# Patient Record
Sex: Female | Born: 1940
Health system: Southern US, Community
[De-identification: ages and names within clinical notes are randomized; demographics above are authoritative.]

## PROBLEM LIST (undated history)

## (undated) DIAGNOSIS — S638X2A Sprain of other part of left wrist and hand, initial encounter: Secondary | ICD-10-CM

## (undated) DIAGNOSIS — M1990 Unspecified osteoarthritis, unspecified site: Secondary | ICD-10-CM

## (undated) DIAGNOSIS — F418 Other specified anxiety disorders: Secondary | ICD-10-CM

## (undated) DIAGNOSIS — E785 Hyperlipidemia, unspecified: Secondary | ICD-10-CM

## (undated) DIAGNOSIS — C3490 Malignant neoplasm of unspecified part of unspecified bronchus or lung: Secondary | ICD-10-CM

## (undated) DIAGNOSIS — M8580 Other specified disorders of bone density and structure, unspecified site: Secondary | ICD-10-CM

## (undated) DIAGNOSIS — K635 Polyp of colon: Secondary | ICD-10-CM

## (undated) DIAGNOSIS — N3020 Other chronic cystitis without hematuria: Secondary | ICD-10-CM

## (undated) DIAGNOSIS — K449 Diaphragmatic hernia without obstruction or gangrene: Secondary | ICD-10-CM

## (undated) DIAGNOSIS — I10 Essential (primary) hypertension: Secondary | ICD-10-CM

## (undated) DIAGNOSIS — K219 Gastro-esophageal reflux disease without esophagitis: Secondary | ICD-10-CM

## (undated) DIAGNOSIS — M4620 Osteomyelitis of vertebra, site unspecified: Secondary | ICD-10-CM

## (undated) DIAGNOSIS — K5790 Diverticulosis of intestine, part unspecified, without perforation or abscess without bleeding: Secondary | ICD-10-CM

## (undated) DIAGNOSIS — K279 Peptic ulcer, site unspecified, unspecified as acute or chronic, without hemorrhage or perforation: Secondary | ICD-10-CM

## (undated) HISTORY — PX: OOPHORECTOMY: SHX86

## (undated) HISTORY — DX: Other specified anxiety disorders: F41.8

## (undated) HISTORY — PX: BLADDER SURGERY: SHX569

## (undated) HISTORY — DX: Diaphragmatic hernia without obstruction or gangrene: K44.9

## (undated) HISTORY — DX: Other specified disorders of bone density and structure, unspecified site: M85.80

## (undated) HISTORY — DX: Diverticulosis of intestine, part unspecified, without perforation or abscess without bleeding: K57.90

## (undated) HISTORY — DX: Sprain of other part of left wrist and hand, initial encounter: S63.8X2A

## (undated) HISTORY — DX: Polyp of colon: K63.5

## (undated) HISTORY — PX: TUBAL LIGATION: SHX77

## (undated) HISTORY — DX: Essential (primary) hypertension: I10

## (undated) HISTORY — DX: Other chronic cystitis without hematuria: N30.20

## (undated) HISTORY — DX: Peptic ulcer, site unspecified, unspecified as acute or chronic, without hemorrhage or perforation: K27.9

## (undated) HISTORY — DX: Malignant neoplasm of unspecified part of unspecified bronchus or lung: C34.90

## (undated) HISTORY — DX: Osteomyelitis of vertebra, site unspecified: M46.20

## (undated) HISTORY — DX: Hyperlipidemia, unspecified: E78.5

## (undated) HISTORY — DX: Gastro-esophageal reflux disease without esophagitis: K21.9

## (undated) HISTORY — DX: Unspecified osteoarthritis, unspecified site: M19.90

---

## 1985-05-24 DIAGNOSIS — C3490 Malignant neoplasm of unspecified part of unspecified bronchus or lung: Secondary | ICD-10-CM

## 1985-05-24 HISTORY — DX: Malignant neoplasm of unspecified part of unspecified bronchus or lung: C34.90

## 1997-07-22 HISTORY — PX: BREAST BIOPSY: SHX20

## 1998-05-24 DIAGNOSIS — M4620 Osteomyelitis of vertebra, site unspecified: Secondary | ICD-10-CM

## 1998-05-24 HISTORY — PX: MOUTH SURGERY: SHX715

## 1998-05-24 HISTORY — DX: Osteomyelitis of vertebra, site unspecified: M46.20

## 2001-07-18 HISTORY — PX: TOTAL ABDOMINAL HYSTERECTOMY: SHX209

## 2002-07-12 ENCOUNTER — Other Ambulatory Visit: Admission: RE | Admit: 2002-07-12 | Discharge: 2002-07-12 | Payer: Self-pay | Admitting: Family Medicine

## 2002-07-12 ENCOUNTER — Encounter: Payer: Self-pay | Admitting: Family Medicine

## 2003-01-17 LAB — HM DEXA SCAN

## 2004-03-02 ENCOUNTER — Ambulatory Visit: Payer: Self-pay | Admitting: Family Medicine

## 2004-04-20 ENCOUNTER — Ambulatory Visit: Payer: Self-pay | Admitting: Family Medicine

## 2004-04-22 ENCOUNTER — Ambulatory Visit: Payer: Self-pay | Admitting: Family Medicine

## 2004-09-11 ENCOUNTER — Ambulatory Visit: Payer: Self-pay | Admitting: Family Medicine

## 2004-11-27 ENCOUNTER — Ambulatory Visit: Payer: Self-pay | Admitting: Family Medicine

## 2005-02-16 ENCOUNTER — Ambulatory Visit: Payer: Self-pay | Admitting: Family Medicine

## 2005-03-22 ENCOUNTER — Ambulatory Visit: Payer: Self-pay | Admitting: Family Medicine

## 2005-03-25 ENCOUNTER — Ambulatory Visit: Payer: Self-pay | Admitting: Family Medicine

## 2005-04-14 ENCOUNTER — Ambulatory Visit: Payer: Self-pay | Admitting: Family Medicine

## 2005-04-23 ENCOUNTER — Ambulatory Visit: Payer: Self-pay | Admitting: Family Medicine

## 2005-05-07 ENCOUNTER — Ambulatory Visit: Payer: Self-pay | Admitting: Family Medicine

## 2005-09-22 ENCOUNTER — Ambulatory Visit: Payer: Self-pay | Admitting: Family Medicine

## 2005-11-08 ENCOUNTER — Ambulatory Visit: Payer: Self-pay | Admitting: Family Medicine

## 2006-02-21 ENCOUNTER — Encounter: Payer: Self-pay | Admitting: Family Medicine

## 2006-03-25 ENCOUNTER — Ambulatory Visit: Payer: Self-pay | Admitting: Family Medicine

## 2006-03-25 LAB — CONVERTED CEMR LAB: Hgb A1c MFr Bld: 5.7 %

## 2006-03-29 ENCOUNTER — Ambulatory Visit: Payer: Self-pay | Admitting: Family Medicine

## 2006-04-19 ENCOUNTER — Ambulatory Visit: Payer: Self-pay | Admitting: Family Medicine

## 2006-04-28 ENCOUNTER — Ambulatory Visit: Payer: Self-pay | Admitting: Family Medicine

## 2006-06-16 ENCOUNTER — Ambulatory Visit: Payer: Self-pay | Admitting: Family Medicine

## 2006-08-01 ENCOUNTER — Ambulatory Visit: Payer: Self-pay | Admitting: Internal Medicine

## 2006-08-22 ENCOUNTER — Ambulatory Visit: Payer: Self-pay | Admitting: Family Medicine

## 2006-10-26 ENCOUNTER — Ambulatory Visit: Payer: Self-pay | Admitting: Family Medicine

## 2006-10-26 LAB — CONVERTED CEMR LAB
Glucose, Urine, Semiquant: NEGATIVE
Ketones, urine, test strip: NEGATIVE
Urobilinogen, UA: 0.2
WBC Urine, dipstick: NEGATIVE
pH: 7

## 2006-11-09 ENCOUNTER — Ambulatory Visit: Payer: Self-pay | Admitting: Family Medicine

## 2006-11-21 ENCOUNTER — Encounter: Payer: Self-pay | Admitting: Family Medicine

## 2006-11-21 DIAGNOSIS — F329 Major depressive disorder, single episode, unspecified: Secondary | ICD-10-CM | POA: Insufficient documentation

## 2006-11-21 DIAGNOSIS — I10 Essential (primary) hypertension: Secondary | ICD-10-CM

## 2006-11-21 DIAGNOSIS — R739 Hyperglycemia, unspecified: Secondary | ICD-10-CM

## 2006-11-21 DIAGNOSIS — F411 Generalized anxiety disorder: Secondary | ICD-10-CM | POA: Insufficient documentation

## 2006-11-21 DIAGNOSIS — R799 Abnormal finding of blood chemistry, unspecified: Secondary | ICD-10-CM | POA: Insufficient documentation

## 2006-11-21 DIAGNOSIS — F331 Major depressive disorder, recurrent, moderate: Secondary | ICD-10-CM

## 2006-11-21 DIAGNOSIS — N39498 Other specified urinary incontinence: Secondary | ICD-10-CM | POA: Insufficient documentation

## 2006-11-21 DIAGNOSIS — F32A Depression, unspecified: Secondary | ICD-10-CM | POA: Insufficient documentation

## 2006-11-24 ENCOUNTER — Ambulatory Visit: Payer: Self-pay | Admitting: Family Medicine

## 2006-12-09 ENCOUNTER — Ambulatory Visit: Payer: Self-pay | Admitting: Pulmonary Disease

## 2007-01-04 ENCOUNTER — Encounter: Payer: Self-pay | Admitting: Family Medicine

## 2007-01-27 ENCOUNTER — Ambulatory Visit: Payer: Self-pay | Admitting: Pulmonary Disease

## 2007-02-01 ENCOUNTER — Ambulatory Visit: Payer: Self-pay | Admitting: Pulmonary Disease

## 2007-02-01 LAB — CONVERTED CEMR LAB
CO2: 31 meq/L (ref 19–32)
Chloride: 102 meq/L (ref 96–112)
Creatinine, Ser: 0.8 mg/dL (ref 0.4–1.2)
Glucose, Bld: 114 mg/dL — ABNORMAL HIGH (ref 70–99)
Sodium: 140 meq/L (ref 135–145)

## 2007-02-06 ENCOUNTER — Ambulatory Visit: Payer: Self-pay | Admitting: Urology

## 2007-02-11 ENCOUNTER — Encounter: Payer: Self-pay | Admitting: Family Medicine

## 2007-02-23 ENCOUNTER — Ambulatory Visit: Payer: Self-pay | Admitting: Pulmonary Disease

## 2007-04-03 ENCOUNTER — Ambulatory Visit: Payer: Self-pay | Admitting: Family Medicine

## 2007-04-03 LAB — CONVERTED CEMR LAB
ALT: 29 units/L (ref 0–35)
Calcium: 9.8 mg/dL (ref 8.4–10.5)
Chloride: 101 meq/L (ref 96–112)
Direct LDL: 139.7 mg/dL
GFR calc non Af Amer: 67 mL/min
Sodium: 141 meq/L (ref 135–145)
TSH: 1.74 microintl units/mL (ref 0.35–5.50)
Total CHOL/HDL Ratio: 3.2
VLDL: 33 mg/dL (ref 0–40)

## 2007-04-04 ENCOUNTER — Ambulatory Visit: Payer: Self-pay | Admitting: Family Medicine

## 2007-04-21 ENCOUNTER — Encounter: Payer: Self-pay | Admitting: Family Medicine

## 2007-04-21 ENCOUNTER — Ambulatory Visit: Payer: Self-pay | Admitting: Family Medicine

## 2007-04-21 ENCOUNTER — Telehealth (INDEPENDENT_AMBULATORY_CARE_PROVIDER_SITE_OTHER): Payer: Self-pay | Admitting: *Deleted

## 2007-04-21 LAB — CONVERTED CEMR LAB
Bilirubin Urine: NEGATIVE
Urobilinogen, UA: 0.2

## 2007-04-28 ENCOUNTER — Encounter: Payer: Self-pay | Admitting: Pulmonary Disease

## 2007-05-01 ENCOUNTER — Ambulatory Visit: Payer: Self-pay | Admitting: Internal Medicine

## 2007-05-05 ENCOUNTER — Telehealth: Payer: Self-pay | Admitting: Pulmonary Disease

## 2007-05-08 ENCOUNTER — Encounter: Payer: Self-pay | Admitting: Family Medicine

## 2007-05-30 ENCOUNTER — Ambulatory Visit: Payer: Self-pay | Admitting: Family Medicine

## 2007-05-30 ENCOUNTER — Encounter: Payer: Self-pay | Admitting: Family Medicine

## 2007-05-31 ENCOUNTER — Encounter (INDEPENDENT_AMBULATORY_CARE_PROVIDER_SITE_OTHER): Payer: Self-pay | Admitting: *Deleted

## 2007-06-01 ENCOUNTER — Ambulatory Visit: Payer: Self-pay | Admitting: Family Medicine

## 2007-09-11 ENCOUNTER — Telehealth: Payer: Self-pay | Admitting: Family Medicine

## 2007-09-14 ENCOUNTER — Encounter: Admission: RE | Admit: 2007-09-14 | Discharge: 2007-09-14 | Payer: Self-pay | Admitting: Family Medicine

## 2007-09-14 ENCOUNTER — Encounter (INDEPENDENT_AMBULATORY_CARE_PROVIDER_SITE_OTHER): Payer: Self-pay | Admitting: *Deleted

## 2007-10-20 ENCOUNTER — Ambulatory Visit: Payer: Self-pay | Admitting: Family Medicine

## 2007-12-25 ENCOUNTER — Encounter: Payer: Self-pay | Admitting: Family Medicine

## 2007-12-26 ENCOUNTER — Telehealth: Payer: Self-pay | Admitting: Family Medicine

## 2008-01-26 ENCOUNTER — Encounter: Payer: Self-pay | Admitting: Family Medicine

## 2008-02-01 ENCOUNTER — Ambulatory Visit: Payer: Self-pay | Admitting: Pulmonary Disease

## 2008-02-01 ENCOUNTER — Ambulatory Visit: Payer: Self-pay | Admitting: Family Medicine

## 2008-02-01 DIAGNOSIS — J309 Allergic rhinitis, unspecified: Secondary | ICD-10-CM | POA: Insufficient documentation

## 2008-02-01 DIAGNOSIS — J439 Emphysema, unspecified: Secondary | ICD-10-CM | POA: Insufficient documentation

## 2008-02-01 DIAGNOSIS — R0902 Hypoxemia: Secondary | ICD-10-CM

## 2008-02-01 DIAGNOSIS — I498 Other specified cardiac arrhythmias: Secondary | ICD-10-CM

## 2008-02-13 ENCOUNTER — Encounter: Payer: Self-pay | Admitting: Pulmonary Disease

## 2008-02-15 ENCOUNTER — Ambulatory Visit: Payer: Self-pay | Admitting: Urology

## 2008-02-20 ENCOUNTER — Encounter: Payer: Self-pay | Admitting: Family Medicine

## 2008-02-21 ENCOUNTER — Ambulatory Visit: Payer: Self-pay | Admitting: Family Medicine

## 2008-02-27 ENCOUNTER — Ambulatory Visit: Payer: Self-pay | Admitting: Urology

## 2008-03-01 ENCOUNTER — Telehealth: Payer: Self-pay | Admitting: Internal Medicine

## 2008-03-04 ENCOUNTER — Encounter: Payer: Self-pay | Admitting: Family Medicine

## 2008-03-06 ENCOUNTER — Telehealth: Payer: Self-pay | Admitting: Family Medicine

## 2008-03-07 ENCOUNTER — Ambulatory Visit: Payer: Self-pay | Admitting: Family Medicine

## 2008-03-07 LAB — CONVERTED CEMR LAB
BUN: 18 mg/dL (ref 6–23)
Chloride: 104 meq/L (ref 96–112)
Creatinine, Ser: 0.8 mg/dL (ref 0.4–1.2)
GFR calc non Af Amer: 76 mL/min
Glucose, Bld: 100 mg/dL — ABNORMAL HIGH (ref 70–99)

## 2008-03-15 ENCOUNTER — Ambulatory Visit: Payer: Self-pay | Admitting: Family Medicine

## 2008-03-18 ENCOUNTER — Ambulatory Visit: Payer: Self-pay | Admitting: Family Medicine

## 2008-03-18 LAB — CONVERTED CEMR LAB
BUN: 17 mg/dL (ref 6–23)
CO2: 30 meq/L (ref 19–32)
Calcium: 9 mg/dL (ref 8.4–10.5)
Creatinine, Ser: 0.9 mg/dL (ref 0.4–1.2)
Glucose, Bld: 119 mg/dL — ABNORMAL HIGH (ref 70–99)

## 2008-03-19 LAB — CONVERTED CEMR LAB: Vit D, 1,25-Dihydroxy: 29 — ABNORMAL LOW (ref 30–89)

## 2008-03-20 ENCOUNTER — Ambulatory Visit: Payer: Self-pay | Admitting: Family Medicine

## 2008-04-11 ENCOUNTER — Ambulatory Visit: Payer: Self-pay | Admitting: Family Medicine

## 2008-04-17 ENCOUNTER — Telehealth: Payer: Self-pay | Admitting: Family Medicine

## 2008-05-20 ENCOUNTER — Telehealth: Payer: Self-pay | Admitting: Family Medicine

## 2008-06-17 ENCOUNTER — Ambulatory Visit: Payer: Self-pay | Admitting: Family Medicine

## 2008-06-17 LAB — CONVERTED CEMR LAB
BUN: 14 mg/dL (ref 6–23)
CO2: 31 meq/L (ref 19–32)
Calcium: 9.1 mg/dL (ref 8.4–10.5)
Creatinine, Ser: 0.9 mg/dL (ref 0.4–1.2)
Glucose, Bld: 97 mg/dL (ref 70–99)

## 2008-06-18 ENCOUNTER — Telehealth: Payer: Self-pay | Admitting: Family Medicine

## 2008-06-19 ENCOUNTER — Ambulatory Visit: Payer: Self-pay | Admitting: Family Medicine

## 2008-06-19 DIAGNOSIS — R5382 Chronic fatigue, unspecified: Secondary | ICD-10-CM

## 2008-06-24 ENCOUNTER — Encounter: Payer: Self-pay | Admitting: Family Medicine

## 2008-06-25 ENCOUNTER — Telehealth: Payer: Self-pay | Admitting: Family Medicine

## 2008-08-08 ENCOUNTER — Telehealth (INDEPENDENT_AMBULATORY_CARE_PROVIDER_SITE_OTHER): Payer: Self-pay | Admitting: *Deleted

## 2008-09-05 ENCOUNTER — Ambulatory Visit: Payer: Self-pay | Admitting: Urology

## 2008-09-05 ENCOUNTER — Encounter: Payer: Self-pay | Admitting: Family Medicine

## 2008-09-18 ENCOUNTER — Ambulatory Visit: Payer: Self-pay | Admitting: Family Medicine

## 2008-09-18 LAB — CONVERTED CEMR LAB
Eosinophils Absolute: 0.2 10*3/uL (ref 0.0–0.7)
HCT: 39.6 % (ref 36.0–46.0)
Lymphs Abs: 2.2 10*3/uL (ref 0.7–4.0)
MCHC: 35.1 g/dL (ref 30.0–36.0)
MCV: 93.6 fL (ref 78.0–100.0)
Monocytes Absolute: 0.7 10*3/uL (ref 0.1–1.0)
Neutrophils Relative %: 60.5 % (ref 43.0–77.0)
Platelets: 224 10*3/uL (ref 150.0–400.0)
TSH: 1.31 microintl units/mL (ref 0.35–5.50)

## 2008-09-19 ENCOUNTER — Ambulatory Visit: Payer: Self-pay | Admitting: Family Medicine

## 2008-09-19 LAB — CONVERTED CEMR LAB
ALT: 20 units/L (ref 0–35)
AST: 23 units/L (ref 0–37)
BUN: 13 mg/dL (ref 6–23)
Bilirubin, Direct: 0 mg/dL (ref 0.0–0.3)
CO2: 33 meq/L — ABNORMAL HIGH (ref 19–32)
Chloride: 104 meq/L (ref 96–112)
Cholesterol: 211 mg/dL — ABNORMAL HIGH (ref 0–200)
Creatinine, Ser: 0.9 mg/dL (ref 0.4–1.2)
Direct LDL: 129.6 mg/dL
Microalb, Ur: 0.1 mg/dL (ref 0.0–1.9)
Potassium: 3.9 meq/L (ref 3.5–5.1)
Total Bilirubin: 0.7 mg/dL (ref 0.3–1.2)
Total CHOL/HDL Ratio: 4
Total Protein: 6.6 g/dL (ref 6.0–8.3)
Vit D, 25-Hydroxy: 29 ng/mL — ABNORMAL LOW (ref 30–89)

## 2008-09-23 ENCOUNTER — Encounter: Payer: Self-pay | Admitting: Family Medicine

## 2008-09-23 ENCOUNTER — Ambulatory Visit: Payer: Self-pay | Admitting: Family Medicine

## 2008-09-23 LAB — HM MAMMOGRAPHY: HM Mammogram: NORMAL

## 2008-09-24 ENCOUNTER — Ambulatory Visit: Payer: Self-pay | Admitting: Family Medicine

## 2008-09-25 ENCOUNTER — Encounter (INDEPENDENT_AMBULATORY_CARE_PROVIDER_SITE_OTHER): Payer: Self-pay | Admitting: *Deleted

## 2008-09-25 ENCOUNTER — Encounter: Payer: Self-pay | Admitting: Family Medicine

## 2008-11-11 ENCOUNTER — Ambulatory Visit: Payer: Self-pay | Admitting: Family Medicine

## 2008-11-11 LAB — CONVERTED CEMR LAB: AST: 26 units/L (ref 0–37)

## 2008-12-11 ENCOUNTER — Ambulatory Visit: Payer: Self-pay | Admitting: Family Medicine

## 2008-12-16 ENCOUNTER — Ambulatory Visit: Payer: Self-pay | Admitting: Family Medicine

## 2008-12-16 LAB — CONVERTED CEMR LAB
AST: 29 units/L (ref 0–37)
Albumin: 3.8 g/dL (ref 3.5–5.2)
Alkaline Phosphatase: 47 units/L (ref 39–117)
Bilirubin, Direct: 0.1 mg/dL (ref 0.0–0.3)
Calcium: 9.4 mg/dL (ref 8.4–10.5)
GFR calc non Af Amer: 66.23 mL/min (ref >60)
Glucose, Bld: 104 mg/dL — ABNORMAL HIGH (ref 70–99)
Potassium: 3.8 meq/L (ref 3.5–5.1)
Sodium: 138 meq/L (ref 135–145)
Total Bilirubin: 0.9 mg/dL (ref 0.3–1.2)

## 2008-12-24 ENCOUNTER — Ambulatory Visit: Payer: Self-pay | Admitting: Family Medicine

## 2008-12-24 LAB — CONVERTED CEMR LAB
ALT: 24 units/L (ref 0–35)
AST: 22 units/L (ref 0–37)
HDL: 63.6 mg/dL (ref >39.00)
LDL Cholesterol: 88 mg/dL (ref 0–99)
Total CHOL/HDL Ratio: 3
VLDL: 22.6 mg/dL (ref 0.0–40.0)

## 2008-12-30 ENCOUNTER — Ambulatory Visit: Payer: Self-pay | Admitting: Family Medicine

## 2009-01-01 ENCOUNTER — Encounter (INDEPENDENT_AMBULATORY_CARE_PROVIDER_SITE_OTHER): Payer: Self-pay | Admitting: *Deleted

## 2009-01-01 ENCOUNTER — Ambulatory Visit: Payer: Self-pay | Admitting: Family Medicine

## 2009-01-01 LAB — CONVERTED CEMR LAB: OCCULT 3: NEGATIVE

## 2009-02-06 ENCOUNTER — Ambulatory Visit: Payer: Self-pay | Admitting: Urology

## 2009-02-07 ENCOUNTER — Other Ambulatory Visit: Payer: Self-pay | Admitting: General Surgery

## 2009-02-10 ENCOUNTER — Telehealth (INDEPENDENT_AMBULATORY_CARE_PROVIDER_SITE_OTHER): Payer: Self-pay | Admitting: *Deleted

## 2009-02-10 ENCOUNTER — Ambulatory Visit: Payer: Self-pay | Admitting: General Surgery

## 2009-02-10 HISTORY — PX: CHOLECYSTECTOMY: SHX55

## 2009-02-11 ENCOUNTER — Observation Stay: Payer: Self-pay | Admitting: General Surgery

## 2009-04-02 ENCOUNTER — Telehealth: Payer: Self-pay | Admitting: Family Medicine

## 2009-05-05 ENCOUNTER — Ambulatory Visit: Payer: Self-pay | Admitting: Family Medicine

## 2009-05-07 ENCOUNTER — Encounter: Payer: Self-pay | Admitting: Family Medicine

## 2009-06-09 ENCOUNTER — Encounter: Payer: Self-pay | Admitting: Family Medicine

## 2009-07-17 ENCOUNTER — Ambulatory Visit: Payer: Self-pay | Admitting: Otolaryngology

## 2009-08-18 ENCOUNTER — Telehealth: Payer: Self-pay | Admitting: Family Medicine

## 2009-08-18 DIAGNOSIS — D126 Benign neoplasm of colon, unspecified: Secondary | ICD-10-CM | POA: Insufficient documentation

## 2009-08-20 ENCOUNTER — Encounter (INDEPENDENT_AMBULATORY_CARE_PROVIDER_SITE_OTHER): Payer: Self-pay | Admitting: *Deleted

## 2009-08-21 ENCOUNTER — Telehealth: Payer: Self-pay | Admitting: Family Medicine

## 2009-08-29 ENCOUNTER — Telehealth: Payer: Self-pay | Admitting: Family Medicine

## 2009-09-19 ENCOUNTER — Encounter (INDEPENDENT_AMBULATORY_CARE_PROVIDER_SITE_OTHER): Payer: Self-pay | Admitting: *Deleted

## 2009-09-21 HISTORY — PX: REPLACEMENT TOTAL KNEE: SUR1224

## 2009-09-23 ENCOUNTER — Encounter: Payer: Self-pay | Admitting: Pulmonary Disease

## 2009-09-24 ENCOUNTER — Ambulatory Visit: Payer: Self-pay | Admitting: Internal Medicine

## 2009-09-29 ENCOUNTER — Ambulatory Visit: Payer: Self-pay | Admitting: Family Medicine

## 2009-09-29 LAB — CONVERTED CEMR LAB
ALT: 22 units/L (ref 0–35)
AST: 23 units/L (ref 0–37)
Albumin: 4.2 g/dL (ref 3.5–5.2)
BUN: 31 mg/dL — ABNORMAL HIGH (ref 6–23)
Basophils Absolute: 0.1 10*3/uL (ref 0.0–0.1)
CO2: 35 meq/L — ABNORMAL HIGH (ref 19–32)
Chloride: 94 meq/L — ABNORMAL LOW (ref 96–112)
Cholesterol: 209 mg/dL — ABNORMAL HIGH (ref 0–200)
Creatinine,U: 83.2 mg/dL
Direct LDL: 117.8 mg/dL
Eosinophils Relative: 3 % (ref 0.0–5.0)
GFR calc non Af Amer: 62.84 mL/min (ref >60)
Glucose, Bld: 106 mg/dL — ABNORMAL HIGH (ref 70–99)
HCT: 39.9 % (ref 36.0–46.0)
Hemoglobin: 13.8 g/dL (ref 12.0–15.0)
Lymphs Abs: 2.1 10*3/uL (ref 0.7–4.0)
MCV: 95.1 fL (ref 78.0–100.0)
Microalb, Ur: 0.9 mg/dL (ref 0.0–1.9)
Monocytes Absolute: 0.6 10*3/uL (ref 0.1–1.0)
Monocytes Relative: 8.6 % (ref 3.0–12.0)
Neutro Abs: 3.6 10*3/uL (ref 1.4–7.7)
Platelets: 222 10*3/uL (ref 150.0–400.0)
Potassium: 3.5 meq/L (ref 3.5–5.1)
RDW: 13.1 % (ref 11.5–14.6)
Sodium: 141 meq/L (ref 135–145)
TSH: 2.65 microintl units/mL (ref 0.35–5.50)

## 2009-09-30 LAB — CONVERTED CEMR LAB: Vit D, 25-Hydroxy: 78 ng/mL (ref 30–89)

## 2009-10-02 ENCOUNTER — Other Ambulatory Visit: Admission: RE | Admit: 2009-10-02 | Discharge: 2009-10-02 | Payer: Self-pay | Admitting: Family Medicine

## 2009-10-02 ENCOUNTER — Ambulatory Visit: Payer: Self-pay | Admitting: Family Medicine

## 2009-10-02 LAB — CONVERTED CEMR LAB: Pap Smear: NORMAL

## 2009-10-06 ENCOUNTER — Ambulatory Visit: Payer: Self-pay | Admitting: Specialist

## 2009-10-07 ENCOUNTER — Ambulatory Visit: Payer: Self-pay | Admitting: Internal Medicine

## 2009-10-07 LAB — CONVERTED CEMR LAB
Calcium: 8.7 mg/dL (ref 8.4–10.5)
Creatinine, Ser: 0.7 mg/dL (ref 0.4–1.2)
GFR calc non Af Amer: 84.13 mL/min (ref >60)
Sodium: 142 meq/L (ref 135–145)

## 2009-10-08 ENCOUNTER — Encounter: Payer: Self-pay | Admitting: Internal Medicine

## 2009-10-09 ENCOUNTER — Ambulatory Visit: Payer: Self-pay | Admitting: Internal Medicine

## 2009-10-09 DIAGNOSIS — K5790 Diverticulosis of intestine, part unspecified, without perforation or abscess without bleeding: Secondary | ICD-10-CM

## 2009-10-09 HISTORY — DX: Diverticulosis of intestine, part unspecified, without perforation or abscess without bleeding: K57.90

## 2009-10-09 LAB — HM COLONOSCOPY: HM Colonoscopy: ABNORMAL

## 2009-10-09 LAB — CONVERTED CEMR LAB: Pap Smear: NEGATIVE

## 2009-10-10 ENCOUNTER — Ambulatory Visit: Payer: Self-pay | Admitting: Pulmonary Disease

## 2009-10-10 ENCOUNTER — Encounter: Payer: Self-pay | Admitting: Internal Medicine

## 2009-10-13 ENCOUNTER — Telehealth (INDEPENDENT_AMBULATORY_CARE_PROVIDER_SITE_OTHER): Payer: Self-pay | Admitting: *Deleted

## 2009-10-13 ENCOUNTER — Encounter (INDEPENDENT_AMBULATORY_CARE_PROVIDER_SITE_OTHER): Payer: Self-pay | Admitting: *Deleted

## 2009-10-15 ENCOUNTER — Inpatient Hospital Stay: Payer: Self-pay | Admitting: Specialist

## 2009-10-15 ENCOUNTER — Encounter: Payer: Self-pay | Admitting: Family Medicine

## 2009-10-15 ENCOUNTER — Encounter: Payer: Self-pay | Admitting: Pulmonary Disease

## 2009-10-16 ENCOUNTER — Encounter: Payer: Self-pay | Admitting: Pulmonary Disease

## 2009-10-17 ENCOUNTER — Encounter: Payer: Self-pay | Admitting: Pulmonary Disease

## 2009-10-19 ENCOUNTER — Encounter: Payer: Self-pay | Admitting: Pulmonary Disease

## 2009-10-20 ENCOUNTER — Encounter: Payer: Self-pay | Admitting: Pulmonary Disease

## 2009-10-21 ENCOUNTER — Encounter: Payer: Self-pay | Admitting: Pulmonary Disease

## 2009-10-21 ENCOUNTER — Encounter: Payer: Self-pay | Admitting: Internal Medicine

## 2009-10-21 ENCOUNTER — Encounter: Payer: Self-pay | Admitting: Family Medicine

## 2009-10-22 ENCOUNTER — Encounter: Payer: Self-pay | Admitting: Internal Medicine

## 2009-10-30 ENCOUNTER — Encounter: Payer: Self-pay | Admitting: Family Medicine

## 2009-10-31 ENCOUNTER — Telehealth: Payer: Self-pay | Admitting: Internal Medicine

## 2009-11-13 ENCOUNTER — Ambulatory Visit: Payer: Self-pay | Admitting: Pulmonary Disease

## 2009-11-13 ENCOUNTER — Telehealth (INDEPENDENT_AMBULATORY_CARE_PROVIDER_SITE_OTHER): Payer: Self-pay | Admitting: *Deleted

## 2009-11-14 ENCOUNTER — Encounter: Payer: Self-pay | Admitting: Pulmonary Disease

## 2009-11-21 ENCOUNTER — Encounter: Payer: Self-pay | Admitting: Pulmonary Disease

## 2009-11-21 ENCOUNTER — Encounter: Payer: Self-pay | Admitting: Internal Medicine

## 2009-11-25 ENCOUNTER — Telehealth: Payer: Self-pay | Admitting: Family Medicine

## 2009-11-25 ENCOUNTER — Encounter: Payer: Self-pay | Admitting: Pulmonary Disease

## 2009-12-30 ENCOUNTER — Encounter (INDEPENDENT_AMBULATORY_CARE_PROVIDER_SITE_OTHER): Payer: Self-pay | Admitting: *Deleted

## 2010-01-05 ENCOUNTER — Telehealth: Payer: Self-pay | Admitting: Family Medicine

## 2010-01-12 ENCOUNTER — Ambulatory Visit: Payer: Self-pay | Admitting: Pulmonary Disease

## 2010-02-26 ENCOUNTER — Telehealth (INDEPENDENT_AMBULATORY_CARE_PROVIDER_SITE_OTHER): Payer: Self-pay | Admitting: *Deleted

## 2010-02-26 ENCOUNTER — Ambulatory Visit: Payer: Self-pay | Admitting: Internal Medicine

## 2010-02-26 DIAGNOSIS — R05 Cough: Secondary | ICD-10-CM

## 2010-04-07 ENCOUNTER — Encounter: Payer: Self-pay | Admitting: Pulmonary Disease

## 2010-04-14 ENCOUNTER — Telehealth: Payer: Self-pay | Admitting: Pulmonary Disease

## 2010-04-30 ENCOUNTER — Ambulatory Visit: Payer: Self-pay | Admitting: Pulmonary Disease

## 2010-04-30 DIAGNOSIS — Z87448 Personal history of other diseases of urinary system: Secondary | ICD-10-CM

## 2010-05-07 ENCOUNTER — Encounter: Payer: Self-pay | Admitting: Pulmonary Disease

## 2010-06-10 ENCOUNTER — Encounter: Payer: Self-pay | Admitting: Family Medicine

## 2010-06-15 ENCOUNTER — Encounter: Payer: Self-pay | Admitting: Pulmonary Disease

## 2010-06-23 NOTE — Progress Notes (Signed)
Summary: spiriva sig clarification  Phone Note Refill Request Message from:  Fax from Pharmacy on February 26, 2010 12:37 PM  Refills Requested: Medication #1:  SPIRIVA HANDIHALER 18 MCG  CAPS one puff every other day   Supply Requested: 1 month   Last Refilled: 10/24/2009   Notes: [facility] last OV 8.22.11 with follow up in 4 months.  pt's med list states to take spiriva every other day but the request from the pharmacy is for once daily.  please advise, thanks!  Initial call taken by: Boone Master CNA/MA,  February 26, 2010 12:44 PM  Follow-up for Phone Call        I have left it at the patient's discretion about whether she needs to use spiriva once daily or every other day depending on her status.  Please inform patient that I have sent refill to the pharmacy. Follow-up by: [name] MD,  February 26, 2010 6:29 PM  Additional Follow-up for Phone Call Additional follow up Details #1::        Pt aware spiriva rx sent to [facility].  She verbalized understanding. Additional Follow-up by: Gweneth Dimitri RN,  February 27, 2010 8:51 AM    New/Updated Medications: SPIRIVA HANDIHALER 18 MCG  CAPS (TIOTROPIUM BROMIDE MONOHYDRATE) one puff once daily Prescriptions: SPIRIVA HANDIHALER 18 MCG  CAPS (TIOTROPIUM BROMIDE MONOHYDRATE) one puff once daily  #30 x 6   Entered and Authorized by:   [name] MD   Signed by:   [name] MD on 02/26/2010   Method used:   Electronically to        [facility]* (retail)       [address] [place], [place]  [address]       Ph: [phone]       Fax: [phone]   RxID:   [id number]

## 2010-06-23 NOTE — Progress Notes (Signed)
Summary: wants colonoscopy  Phone Note Call from Patient Call back at Home Phone [phone]   Caller: Patient Call For: Shaune Leeks MD Summary of Call: Patient says that she would like referral to have a colonoscopy. Please advise.  Initial call taken by: Melody Comas,  August 18, 2009 10:15 AM  Follow-up for Phone Call        Dr Marina Goodell Colonoscopy 10/07/2009, patient notified. Follow-up by: Carlton Adam,  August 20, 2009 2:13 PM  New Problems: BENIGN NEOPLASM OF COLON (ICD-211.3)   New Problems: BENIGN NEOPLASM OF COLON (ICD-211.3)    Complete Medication List: 1)  Klor-con M20 20 Meq Tbcr (Potassium chloride crys cr) .... Take 1 tablet by mouth once a day 2)  Dyazide 37.5-25 Mg Caps (Triamterene-hctz) .... Take 1 by mouth every day 3)  Zoloft 100 Mg Tabs (Sertraline hcl) .Marland Kitchen.. 1 1/2 tablets by mouth once daily 4)  Spiriva Handihaler 18 Mcg Caps (Tiotropium bromide monohydrate) .... Use once a day 5)  Wellbutrin Sr 150 Mg Xr12h-tab (Bupropion hcl) .Marland Kitchen.. 1 tablet  twice a day by mouth 6)  Symbicort 160-4.5 Mcg/act Aero (Budesonide-formoterol fumarate) .... Two puffs twice daily 7)  Metoprolol Tartrate 25 Mg Tabs (Metoprolol tartrate) .Marland Kitchen.. 1 twice a day by mouth 8)  Oxygen At 2 Liters Per Minute At Bedtime  9)  Furosemide 40 Mg Tabs (Furosemide) .... One tab by mouth in am 10)  Pravachol 80 Mg Tabs (Pravastatin sodium) .... One ta b by mouth at night. 11)  Cozaar 50 Mg Tabs (Losartan potassium) .Marland Kitchen.. 1 daily by mouth 12)  Clobetasol Propionate E 0.05 % Crea (Clobetasol prop emollient base) .... Apply to area two times a day as needed 13)  Vitamin D 2000 Unit Tabs (Cholecalciferol) .Marland Kitchen.. 1 a day by mouth 14)  Glucosamine-chondroitin Caps (Glucosamine-chondroit-vit c-mn) .Marland Kitchen.. 1 daily by mouth 15)  Centrum Silver Tabs (Multiple vitamins-minerals) .... 2 tablets daily by mouth  Other Orders: Gastroenterology Referral (GI)

## 2010-06-23 NOTE — Miscellaneous (Signed)
Summary: CT order  Clinical Lists Changes  Problems: Added new problem of ABDOMINAL PAIN, LEFT LOWER QUADRANT (ICD-789.04) Added new problem of OTHER SPECIFIED DISORDER OF INTESTINES (ZOX-096.04) Orders: Added new Referral order of CT Abdomen/Pelvis with Contrast (CT Abd/Pelvis w/con) - Signed

## 2010-06-23 NOTE — Op Note (Signed)
Summary: [facility]   Imported By: Sherian Rein 12/08/2009 12:12:40  _____________________________________________________________________  External Attachment:    Type:   Image     Comment:   External Document

## 2010-06-23 NOTE — Assessment & Plan Note (Signed)
Summary: 2 month follow up/kcw   Visit Type:  Follow-up Primary Provider/Referring Provider:  Shaune Leeks MD  CC:  COPD.  ONO follow-up.  The patient says her breathing has improved slightly.Marland Kitchen  History of Present Illness: 70 yo female with GOLD 3 COPD and hypoxemia.  She continues to get winded when walking.  As a result she is scared to do any activity.  She is not having much cough, or wheeze.  She denies fever, hemoptysis, or palpitations.  She is using her proair once daily.  Her oxygen at rest today on room air was 87%.  She is using her oxygen with exertion when she feels winded.  She also uses it at night.  Current Medications (verified): 1)  Symbicort 160-4.5 Mcg/act  Aero (Budesonide-Formoterol Fumarate) .... Two Puffs Twice Daily 2)  Spiriva Handihaler 18 Mcg  Caps (Tiotropium Bromide Monohydrate) .... Use Once A Day 3)  Proair Hfa 108 (90 Base) Mcg/act Aers (Albuterol Sulfate) .... Two Puffs Up To Four Times Per Day As Needed 4)  Cozaar 50 Mg Tabs (Losartan Potassium) .Marland Kitchen.. 1 Daily By Mouth 5)  Metoprolol Tartrate 25 Mg Tabs (Metoprolol Tartrate) .Marland Kitchen.. 1 Twice A Day By Mouth 6)  Pravachol 80 Mg Tabs (Pravastatin Sodium) .... One Ta B By Mouth At Night. 7)  Omeprazole 40 Mg Cpdr (Omeprazole) .... Take One By Mouth Daily 8)  Zoloft 100 Mg  Tabs (Sertraline Hcl) .Marland Kitchen.. 1 By Mouth Daily 9)  Wellbutrin Sr 150 Mg  Xr12h-Tab (Bupropion Hcl) .Marland Kitchen.. 1 Tablet  Twice A Day By Mouth 10)  Guaifenesin 400 Mg Tabs (Guaifenesin) .... Take 1 and 1/2 By Mouth Two Times A Day As Needed  Allergies (verified): 1)  ! Pcn  Past History:  Past Medical History: Diverticular Disease Peptic Ulcer Disease GERD Hiatal hernia Dyslipidemia HTN Urinary tract infections Hematuria Hyperglycemia Urinary incontinence Severe COPD with emphysema      - PFT 01/27/07 FEV1 1.22(57%), FEV1% 31, TLC 6.18 (127%), DLCO 60%, no BD Hypoxemia      - uses 2 liters oxygen at rest and sleep, 3 liters with  exertion Lung cancer 1987 Spinal abscess 2000 secondary to periodontal disease Osteoarthritis Depression Anxiety  Past Surgical History: Reviewed history from 10/22/2009 and no changes required. BTL Bladder Surgery L Breast Biopsy 08/09/97 Ba. Swallow, H. H. negative reflux 03/99 Hosp Klebsiella Pneumonia Sepsis due to Gum Surgery 2000 Spinal Abscess removed due to Sciatica 09/1998 MRI L/S, severe degen change L5/S1, 06/19/99 CT abd. wnl, 10/08/98 BE, diverticulia 10/06/98 CT, L/S area, Disc dz L5/S1, R-S1 nerve root positive 09/30/98 Cardiolite wnl EF 74% 07/12/01 TAH BSO, vag repair secondary prolapse 07/18/01 Colonoscopy, polyps, F/U 3 years, Bx. negative 08/28/02 EGD, GERD stricture H. H. 09/10/02 DEXA, [facility], wnl 01/17/03 Abd/Pelvic CT 2 sm cystic lesions in liver Uterus surg absent Divertics in Sigmoid 01/2007 Cystoscopy  nml 02/11/2007 CT Chest 1.9x0.8cm anterior pleural based lesion (Dr Craige Cotta) 02/06/2007 ECHO NML LVF Mild ASclerosis, Mild Pulm Htn CT ABD/ Pelvis Diverticulosis Mod Atherosclerotic Dz ([facility], Dr Achilles Dunk) 09/04/2008 Cystoscopy Nml (Dr Achilles Dunk) 5/5 2010 Choley 02/10/2009 Colonoscopy Polyps x 3  Divertics  Stenosis of Sigmoid (Dr Marina Goodell) 10/07/2009 CT Abd  No Sigmoid Mass DDD L5/S1  10/09/2009 HOSP RKR Postop wheezing and SOB 5/25-5/31/2011 LE Dopplers and Chest CT  No PE 10/15/2009  Review of Systems       The patient complains of non-productive cough and hand/feet swelling.  The patient denies shortness of breath at rest, productive cough, coughing up  blood, chest pain, sore throat, nasal congestion/difficulty breathing through nose, change in color of mucus, and fever.    Vital Signs:  Patient profile:   70 year old female Height:      64 inches (162.56 cm) Weight:      171.38 pounds (77.90 kg) BMI:     29.52 O2 Sat:      93 % on Room air Temp:     97.5 degrees F (36.39 degrees C) oral Pulse rate:   71 / minute BP sitting:   176 / 70  (left arm) Cuff size:    regular  Vitals Entered By: Michel Bickers CMA (January 12, 2010 2:24 PM)  O2 Sat at Rest %:  93 O2 Flow:  Room air  Serial Vital Signs/Assessments:  Comments: 3:43 PM Ambulatory Pulse Oximetry  Resting; HR__68___    02 Sat___94% on 2 liters__pulsed  Lap1 (185 feet)   HR__79___   02 Sat__91% on 2 liters pulsed___ Lap2 (185 feet)   HR__81___   02 Sat__87% on 2 liters pulsed___    Lap3 (185 feet)   HR__82__   02 Sat_91% on 3 liters pulsed____  ___xTest Completed without Difficulty----patients sats did drop on 2 liters pulsed after walking 2 laps. O2 increased to 3 liters pulsed and sats were 91%. ___Test Stopped due to:  By: Michel Bickers CMA   CC: COPD.  ONO follow-up.  The patient says her breathing has improved slightly. Comments Medications reviewed with the patient. Daytime phone verified. Michel Bickers [facility]  January 12, 2010 2:33 PM   Physical Exam  General:  normal appearance, healthy appearing, and obese.   Nose:  no sinus tenderness Mouth:  MP 3 Neck:  no JVD.   Lungs:  diminished breath sounds, no wheezing or rales Heart:  regular rhythm, normal rate, and no murmurs.   Extremities:  no edema Cervical Nodes:  no significant adenopathy   Impression & Recommendations:  Problem # 1:  COPD (ICD-496) She is concerned about the expensive of her inhalers.  Her breathing has been stable.  Will have her try spiriva every other day.  If she is stable will then try symbicort one puff two times a day.  Will refer to pulmonary rehab.  She will decide if she would want to do this based on finacial expense.    Problem # 2:  HYPOXEMIA (ICD-799.02) She will need to use 2 liters at rest, 3liters with exertion, and 2 liters at night.  Medications Added to Medication List This Visit: 1)  Symbicort 160-4.5 Mcg/act Aero (Budesonide-formoterol fumarate) .... Two puffs twice daily 2)  Spiriva Handihaler 18 Mcg Caps (Tiotropium bromide monohydrate) .... One puff every other  day  Complete Medication List: 1)  Symbicort 160-4.5 Mcg/act Aero (Budesonide-formoterol fumarate) .... Two puffs twice daily 2)  Spiriva Handihaler 18 Mcg Caps (Tiotropium bromide monohydrate) .... One puff every other day 3)  Proair Hfa 108 (90 Base) Mcg/act Aers (Albuterol sulfate) .... Two puffs up to four times per day as needed 4)  Cozaar 50 Mg Tabs (Losartan potassium) .Marland Kitchen.. 1 daily by mouth 5)  Metoprolol Tartrate 25 Mg Tabs (Metoprolol tartrate) .Marland Kitchen.. 1 twice a day by mouth 6)  Pravachol 80 Mg Tabs (Pravastatin sodium) .... One ta b by mouth at night. 7)  Omeprazole 40 Mg Cpdr (Omeprazole) .... Take one by mouth daily 8)  Zoloft 100 Mg Tabs (Sertraline hcl) .Marland Kitchen.. 1 by mouth daily 9)  Wellbutrin Sr 150 Mg Xr12h-tab (Bupropion hcl) .Marland Kitchen.. 1 tablet  twice a day by mouth 10)  Guaifenesin 400 Mg Tabs (Guaifenesin) .... Take 1 and 1/2 by mouth two times a day as needed  Other Orders: Est. Patient Level III ([address]) DME Referral (DME) Rehabilitation Referral (Rehab)  Patient Instructions: 1)  Try spiriva one puff every other day 2)  If okay then try symbicort one puff two times a day 3)  Proair air two puffs up to four times per day as needed 4)  Will refer to pulmonary rehab 5)  Use 2 liters oxygen at rest and with sleep, use 3 liters oxygen with exertion 6)  Follow up in 4 months

## 2010-06-23 NOTE — Letter (Signed)
Summary: [facility] Instructions  [facility]  [address] [place], [place] [address]   Phone: [phone]  Fax: [phone]       [name] [name]    28-Sep-1940    MRN: [id number]        Procedure Day Dorna Bloom:  [name]  10/07/09     Arrival Time:  10:00AM      Procedure Time:  11:00AM     Location of Procedure:                    _X _  [facility] (4th Floor)                        PREPARATION FOR COLONOSCOPY WITH MOVIPREP   Starting 5 days prior to your procedure 10/02/09 do not eat nuts, seeds, popcorn, corn, beans, peas,  salads, or any raw vegetables.  Do not take any fiber supplements (e.g. Metamucil, Citrucel, and Benefiber).  THE DAY BEFORE YOUR PROCEDURE         DATE: 10/06/09  DAY: MONDAY  1.  Drink clear liquids the entire day-NO SOLID FOOD  2.  Do not drink anything colored red or purple.  Avoid juices with pulp.  No orange juice.  3.  Drink at least 64 oz. (8 glasses) of fluid/clear liquids during the day to prevent dehydration and help the prep work efficiently.  CLEAR LIQUIDS INCLUDE: Water Jello Ice Popsicles Tea (sugar ok, no milk/cream) Powdered fruit flavored drinks Coffee (sugar ok, no milk/cream) Gatorade Juice: apple, white grape, white cranberry  Lemonade Clear bullion, consomm, broth Carbonated beverages (any kind) Strained chicken noodle soup Hard Candy                             4.  In the morning, mix first dose of MoviPrep solution:    Empty 1 Pouch A and 1 Pouch B into the disposable container    Add lukewarm drinking water to the top line of the container. Mix to dissolve    Refrigerate (mixed solution should be used within 24 hrs)  5.  Begin drinking the prep at 5:00 p.m. The MoviPrep container is divided by 4 marks.   Every 15 minutes drink the solution down to the next mark (approximately 8 oz) until the full liter is complete.   6.  Follow completed prep with 16 oz of clear liquid of your choice  (Nothing red or purple).  Continue to drink clear liquids until bedtime.  7.  Before going to bed, mix second dose of MoviPrep solution:    Empty 1 Pouch A and 1 Pouch B into the disposable container    Add lukewarm drinking water to the top line of the container. Mix to dissolve    Refrigerate  THE DAY OF YOUR PROCEDURE      DATE: 10/07/09  DAY: TUESDAY  Beginning at 6:00AM (5 hours before procedure):         1. Every 15 minutes, drink the solution down to the next mark (approx 8 oz) until the full liter is complete.  2. Follow completed prep with 16 oz. of clear liquid of your choice.    3. You may drink clear liquids until 9:00AM (2 HOURS BEFORE PROCEDURE).   MEDICATION INSTRUCTIONS  Unless otherwise instructed, you should take regular prescription medications with a small sip of water   as early as possible the  morning of your procedure.  Additional medication instructions: Hold your Dyazide and Furosemide the morning of procedure.  Do take your Metoprolol and Cozaar and other prescription medicine that morning.         OTHER INSTRUCTIONS  You will need a responsible adult at least 70 years of age to accompany you and drive you home.   This person must remain in the waiting room during your procedure.  Wear loose fitting clothing that is easily removed.  Leave jewelry and other valuables at home.  However, you may wish to bring a book to read or  an iPod/MP3 player to listen to music as you wait for your procedure to start.  Remove all body piercing jewelry and leave at home.  Total time from sign-in until discharge is approximately 2-3 hours.  You should go home directly after your procedure and rest.  You can resume normal activities the  day after your procedure.  The day of your procedure you should not:   Drive   Make legal decisions   Operate machinery   Drink alcohol   Return to work  You will receive specific instructions about eating,  activities and medications before you leave.    The above instructions have been reviewed and explained to me by   Wyona Almas RN  Sep 24, 2009 10:38 AM     I fully understand and can verbalize these instructions _____________________________ Date _________

## 2010-06-23 NOTE — Letter (Signed)
Summary: [facility]  [facility]   Imported By: Lester [place] 04/10/2010 11:26:02  _____________________________________________________________________  External Attachment:    Type:   Image     Comment:   External Document

## 2010-06-23 NOTE — Letter (Signed)
Summary: Nadara Eaton letter  [facility]  [address] [place], [place] [address]   Phone: [phone]  Fax: [phone]       12/30/2009 MRN: [id number]  [facility] [address] [place], [place]  [address]  Dear Ms. [facility] [place], and [place] announce the retirement of Arta Silence, M.D., from full-time practice at the [facility] office effective November 20, 2009 and his plans of returning part-time.  It is important to Dr. Hetty Ely and to our practice that you understand that [facility] has seven physicians in our office for your health care needs.  We will continue to offer the same exceptional care that you have today.    Dr. Hetty Ely has spoken to many of you about his plans for retirement and returning part-time in the fall.   We will continue to work with you through the transition to schedule appointments for you in the office and meet the high standards that [place] is committed to.   Again, it is with great pleasure that we share the news that Dr. Hetty Ely will return to [facility] in October of 2011 with a reduced schedule.    If you have any questions, or would like to request an appointment with one of our physicians, please call us at [phone] and press the option for Scheduling an appointment.  We take pleasure in providing you with excellent patient care and look forward to seeing you at your next office visit.  Our [facility] Physicians are:  Tillman Abide, M.D. Laurita Quint, M.D. Roxy Manns, M.D. Kerby Nora, M.D. Hannah Beat, M.D. Ruthe Mannan, M.D. We proudly welcomed Raechel Ache, M.D. and Eustaquio Boyden, M.D. to the practice in July/August 2011.  Sincerely,  [place] [name]

## 2010-06-23 NOTE — Progress Notes (Signed)
Summary: refill spiriva--rx sent  Phone Note Call from Patient Call back at Home Phone [phone]   Caller: Patient Call For: sood Summary of Call: pt says [facility] in [place] told her they had faxed a refill req for spiriva 2 days ago.  Initial call taken by: Tivis Ringer, CNA,  February 26, 2010 10:45 AM  Follow-up for Phone Call        [facility] informing pt rx sent to pharmacy.  [name] Reynolds LPN  February 26, 2010 10:53 AM     Prescriptions: SPIRIVA HANDIHALER 18 MCG  CAPS (TIOTROPIUM BROMIDE MONOHYDRATE) one puff every other day  #30 x 0   Entered by:   Arman Filter LPN   Authorized by:   [name] MD   Signed by:   Arman Filter LPN on 09/81/1914   Method used:   Electronically to        [facility]* (retail)       [address] [place], [place]  [address]       Ph: [phone]       Fax: [phone]   RxID:   [id number]

## 2010-06-23 NOTE — Assessment & Plan Note (Signed)
Summary: CPX AND LETTER OF CLEARANCE RT KNEE SURGERY/RI   Vital Signs:  Patient profile:   70 year old female Weight:      167.50 pounds Temp:     98.4 degrees F oral Pulse rate:   68 / minute Pulse rhythm:   regular BP sitting:   112 / 76  (left arm) Cuff size:   large  Vitals Entered By: Sydell Axon LPN (Oct 02, 2009 1:55 PM) CC: 30 Minute checkup, scheduled to have a colonoscopy on 10/07/09, has had a hysterectomy, needs letter for clearance for knee surgery   History of Present Illness: Pt here for Comp Exam as preop physical for right knee surgery. She is gnerally feeling well except for pain in the knee. She has tried about everything and feels surgery to be the remaining option. She has had no problems with breathing recently.  She has had severe pneumonia sepsis  following gum surgery in the past requiring hospitlization and has been followed by Dr Craige Cotta of [facility] for pleural based lesion unchanged and felt to be benign since 9/08. She has has sinus tachycardia in the past but otherwise has never had overt heart disease. She successfully quit smoking in 1997 with a 70 PYH. She has fatigue and SOB with exertion.  Preventive Screening-Counseling & Management  Alcohol-Tobacco     Alcohol drinks/day: 2     Alcohol type: wine x3     Smoking Status: quit     Year Quit: 1997     Pack years: 15     Passive Smoke Exposure: no  Caffeine-Diet-Exercise     Caffeine use/day: 2     Does Patient Exercise: no     Type of exercise: Gym      Times/week: 3  Problems Prior to Update: 1)  Benign Neoplasm of Colon  (ICD-211.3) 2)  Special Screening Malig Neoplasms Other Sites  (ICD-V76.49) 3)  Knee Pain, Right, Acute  (ICD-719.46) 4)  Pruritus  (ICD-698.9) 5)  Fatigue, Chronic  (ICD-780.79) 6)  Edema of Hands  (ICD-782.3) 7)  Sinus Tachycardia  (ICD-427.89) 8)  Hypoxemia  (ICD-799.02) 9)  Allergic Rhinitis  (ICD-477.9) 10)  COPD  (ICD-496) 11)  Allergic Rhinitis Due To Other Allergen   (ICD-477.8) 12)  Health Maintenance Exam  (ICD-V70.0) 13)  Other Screening Mammogram  (ICD-V76.12) 14)  Hematuria, Chronic (W/U NEG)  (ICD-599.7) 15)  Hyperglycemia  (ICD-790.6) 16)  Urinary Incontinence (COPE)  (ICD-788.30) 17)  Stress Incontinence- Dr. Julius Bowels  (ICD-788.39) 18)  Alcoholism, 2nd Marriage  (ICD-303.90) 65)  Hypercholesterolemia, 255/ldl 125  (ICD-272.0) 20)  Hiatal Hernia Via Barium Swallow " With Acid Peptic Dz"  (ICD-553.3) 21)  Emphysema, Post Op Changes, Right Lung, Lung Cancer 1987  (ICD-492.8) 22)  Osteoarthritis Via Xray Uncch  (ICD-715.90) 23)  Hypertension  (ICD-401.9) 24)  Gerd Via Egd  (ICD-530.81) 25)  Diverticulosis, Colon Via Be  (ICD-562.10) 26)  Depression  (ICD-311) 27)  COPD Via Plum.-emphys 11/00  (ICD-496) 28)  Anxiety  (ICD-300.00)  Medications Prior to Update: 1)  Klor-Con M20 20 Meq Tbcr (Potassium Chloride Crys Cr) .... Take 1 Tablet By Mouth Once A Day 2)  Dyazide 37.5-25 Mg Caps (Triamterene-Hctz) .... Take 1 By Mouth Every Day 3)  Zoloft 100 Mg  Tabs (Sertraline Hcl) .Marland Kitchen.. 1 1/2 Tablets By Mouth Once Daily 4)  Spiriva Handihaler 18 Mcg  Caps (Tiotropium Bromide Monohydrate) .... Use Once A Day 5)  Wellbutrin Sr 150 Mg  Xr12h-Tab (Bupropion Hcl) .Marland Kitchen.. 1 Tablet  Twice A Day By Mouth 6)  Symbicort 160-4.5 Mcg/act  Aero (Budesonide-Formoterol Fumarate) .... Two Puffs Twice Daily 7)  Metoprolol Tartrate 25 Mg Tabs (Metoprolol Tartrate) .Marland Kitchen.. 1 Twice A Day By Mouth 8)  Oxygen At 2 Liters Per Minute At Bedtime 9)  Furosemide 40 Mg Tabs (Furosemide) .... One Tab By Mouth in Am 10)  Pravachol 80 Mg Tabs (Pravastatin Sodium) .... One Ta B By Mouth At Night. 11)  Cozaar 50 Mg Tabs (Losartan Potassium) .Marland Kitchen.. 1 Daily By Mouth 12)  Clobetasol Propionate E 0.05 % Crea (Clobetasol Prop Emollient Base) .... Apply To Area Two Times A Day As Needed 13)  Vitamin D 2000 Unit Tabs (Cholecalciferol) .Marland Kitchen.. 1 A Day By Mouth 14)  Glucosamine-Chondroitin  Caps  (Glucosamine-Chondroit-Vit C-Mn) .Marland Kitchen.. 1 Daily By Mouth 15)  Centrum Silver  Tabs (Multiple Vitamins-Minerals) .... 2 Tablets Daily By Mouth 16)  Moviprep 100 Gm  Solr (Peg-Kcl-Nacl-Nasulf-Na Asc-C) .... As Per Prep Instructions.  Allergies: 1)  ! Penicillin G Potassium  Past History:  Past Medical History: Last updated: 02/01/2008 Diverticular Disease Peptic Ulcer Disease GERD Hiatal hernia Dyslipidemia HTN Urinary tract infections Hematuria Hyperglycemia Urinary incontinence Severe COPD with emphysema Lung cancer 1987 Spinal abscess 2000 secondary to periodontal disease Osteoarthritis Depression Anxiety  Family History: Last updated: Oct 18, 2009 Father: Died at age 4 of emphysema and heart disease from smoking and hypertension. Mother: dec 96 Sepsis due UTI  Htn poor health S/P hip fracture, bowel obstruction recently, and pancreatitis with gangrene from the bowel. Brother  A 26 Brother A 73  Arthritis Htn    Social History: Last updated: 02/01/2008 Marital Status: Remarried for the third time, 2nd husband died of heart attack. Children: Two biologic children and two stepsons Occupation: Prior Print production planner at [employer]   Risk Factors: Alcohol Use: 2 (2009/10/18) Caffeine Use: 2 (10/18/09) Exercise: no (10/18/2009)  Risk Factors: Smoking Status: quit (2009-10-18) Passive Smoke Exposure: no (10-18-2009)  Past Surgical History: BTL Bladder Surgery L Breast Biopsy 08/09/97 Ba. Swallow, H. H. negative reflux 03/99 Hosp Klebsiella Pneumonia Sepsis due to Gum Surgery 10-19-98 Spinal Abscess removed due to Sciatica 1998-10-19 MRI L/S, severe degen change L5/S1, 06/19/99 CT abd. wnl, 10/08/98 BE, diverticulia 10/06/98 CT, L/S area, Disc dz L5/S1, R-S1 nerve root positive 09/30/98 Cardiolite wnl EF 74% 07/12/01 TAH BSO, vag repair secondary prolapse 07/18/01 Colonoscopy, polyps, F/U 3 years, Bx. negative 08/28/02 EGD, GERD stricture H. H. 09/10/02 DEXA,  [facility], wnl 01/17/03 Abd/Pelvic CT 2 sm cystic lesions in liver Uterus surg absent Divertics in Sigmoid 01/2007 Cystoscopy  nml 02/11/2007 CT Chest 1.9x0.8cm anterior pleural based lesion (Dr Craige Cotta) 02/06/2007 ECHO NML LVF Mild ASclerosis, Mild Pulm Htn CT ABD/ Pelvis Diverticulosis Mod Atherosclerotic Dz ([facility], Dr Achilles Dunk) 09/04/2008 Cystoscopy Nml (Dr Achilles Dunk) 5/5 2010 Choley 02/10/2009  Family History: Father: Died at age 61 of emphysema and heart disease from smoking and hypertension. Mother: dec 96 Sepsis due UTI  Htn poor health S/P hip fracture, bowel obstruction recently, and pancreatitis with gangrene from the bowel. Brother  A 90 Brother A 73  Arthritis Htn    Review of Systems General:  Complains of sweats; denies chills, fatigue, fever, weakness, and weight loss; occas. Eyes:  Denies blurring, discharge, and eye pain. ENT:  Denies decreased hearing, ear discharge, and ringing in ears. CV:  Complains of fatigue and shortness of breath with exertion; denies chest pain or discomfort, fainting, palpitations, and swelling of feet. Resp:  Complains of shortness of breath; denies cough and wheezing;  has COPD. GI:  Complains of constipation; denies abdominal pain, bloody stools, change in bowel habits, dark tarry stools, diarrhea, indigestion, loss of appetite, nausea, vomiting, vomiting blood, and yellowish skin color; takes Metamucil. GU:  Complains of urinary frequency; denies discharge, dysuria, and nocturia; chronic. MS:  Complains of joint pain; R knee  Wrists bilat hurt, R>>L.Marland Kitchen Derm:  Complains of itching; denies dryness and rash. Neuro:  Denies numbness, poor balance, tingling, and tremors.  Physical Exam  General:  Well-developed,well-nourished,in no acute distress; alert,appropriate and cooperative throughout examination, overweight. Head:  Normocephalic and atraumatic without obvious abnormalities. No apparent alopecia or balding. Sinuses nontender. Eyes:  Conjunctiva clear  bilaterally. No icterus. Ears:  L ear nml, R ear partially occluded with cerumen. Nose:  External nasal examination shows no deformity or inflammation. Nasal mucosa are pink and moist without lesions or exudates. Mouth:  Oral mucosa and oropharynx without lesions or exudates.  Teeth in good repair. Neck:  No deformities, masses, or tenderness noted. Chest Wall:  No deformities, masses, or tenderness noted. Breasts:  No mass, nodules, thickening, tenderness, bulging, retraction, inflamation, nipple discharge or skin changes noted.   Lungs:  Normal respiratory effort, chest expands symmetrically. Lungs are clear to auscultation, no crackles or wheezes except for faint, disant end expiratory fine wheeze.Marland Kitchen Heart:  Normal rate and regular rhythm. S1 and S2 normal without gallop, murmur, click, rub or other extra sounds. Abdomen:  Bowel sounds positive,abdomen soft and non-tender without masses, organomegaly or hernias noted. Rectal:  No external abnormalities noted. Normal sphincter tone. No rectal masses or tenderness. G neg. Genitalia:  Introitus wnl, Uterus and Cervix absent, Adnexa nontender w/o mass, ovaries not felt. Cuff looks very healthy. Pap sent. Msk:  No deformity or scoliosis noted of thoracic or lumbar spine.   Pulses:  R and L carotid,radial,femoral,dorsalis pedis and posterior tibial pulses are full and equal bilaterally Extremities:  No clubbing, cyanosis, edema, or deformity noted with normal full range of motion of all joints except right knee.  R knee in addition slightly swollen, nonerythematous and not hot. Neurologic:  No cranial nerve deficits noted. Station and gait are normal. Sensory, motor and coordinative functions appear intact. Skin:  blotchiness of the right forearm but no overt lesions seen. Cervical Nodes:  No lymphadenopathy noted Axillary Nodes:  No palpable lymphadenopathy Inguinal Nodes:  No significant adenopathy Psych:  Cognition and judgment appear intact.  Alert and cooperative with normal attention span and concentration. No apparent delusions, illusions, hallucinations   Impression & Recommendations:  Problem # 1:  PREOPERATIVE EXAMINATION, RTKR (ICD-V72.84) Pt cleared for surgery pending cardiac and pulmonary clearance. Discussed Zostavax. Had pneumonia shot when had choley 9/10. Orders: Pulmonary Referral (Pulmonary) Cardiology Referral (Cardiology)  Problem # 2:  KNEE PAIN, RIGHT, ACUTE (ICD-719.46) Assessment: Unchanged  To be corected with upcoming surgical procedure.  Problem # 3:  HYPOXEMIA (ICD-799.02) Assessment: Improved Pulse Ox today 95% on RA.  Problem # 4:  COPD (ICD-496) Assessment: Unchanged  Will have pulm clear pt for surgery due to GET. Her updated medication list for this problem includes:    Spiriva Handihaler 18 Mcg Caps (Tiotropium bromide monohydrate) ..... Use once a day    Symbicort 160-4.5 Mcg/act Aero (Budesonide-formoterol fumarate) .Marland Kitchen..Marland Kitchen Two puffs twice daily  Pulmonary Functions Reviewed: O2 sat: 98 (06/19/2008)     Vaccines Reviewed: Pneumovax: Pneumovax (02/10/2009)   Flu Vax: Fluvax MCR (02/21/2008)  Problem # 5:  HYPERGLYCEMIA (ICD-790.6) Assessment: Unchanged Continues mildly elevated. Encouraged to avoid sweets and carbs,  exercise regularly as able and lose weight.  Problem # 6:  HYPERCHOLESTEROLEMIA, 255/LDL 125 (ICD-272.0) Assessment: Deteriorated Mildly worsened. Has obviously loosened her dietary approach. Try to be more compliant with avoiding intake of fatty food. Her updated medication list for this problem includes:    Pravachol 80 Mg Tabs (Pravastatin sodium) ..... One ta b by mouth at night.  Labs Reviewed: SGOT: 23 (09/29/2009)   SGPT: 22 (09/29/2009)   HDL:71.50 (09/29/2009), 63.60 (12/24/2008)  LDL:88 (12/24/2008), DEL (16/02/9603)  Chol:209 (09/29/2009), 174 (12/24/2008)  Trig:221.0 (09/29/2009), 113.0 (12/24/2008)  Problem # 7:  HYPERTENSION (ICD-401.9) Assessment:  Unchanged Stable. Cont curr regimen. Her updated medication list for this problem includes:    Dyazide 37.5-25 Mg Caps (Triamterene-hctz) .Marland Kitchen... Take 1 by mouth every day    Metoprolol Tartrate 25 Mg Tabs (Metoprolol tartrate) .Marland Kitchen... 1 twice a day by mouth    Furosemide 40 Mg Tabs (Furosemide) ..... One tab by mouth in am    Cozaar 50 Mg Tabs (Losartan potassium) .Marland Kitchen... 1 daily by mouth  BP today: 112/76 Prior BP: 110/72 (05/05/2009)  Labs Reviewed: K+: 3.5 (09/29/2009) Creat: : 0.9 (09/29/2009)   Chol: 209 (09/29/2009)   HDL: 71.50 (09/29/2009)   LDL: 88 (12/24/2008)   TG: 221.0 (09/29/2009)  Problem # 8:  DIVERTICULOSIS, COLON VIA BE (ICD-562.10) Assessment: Unchanged Discussed being seen for prolonged LLQ abd discomfort.  Problem # 9:  DEPRESSION (ICD-311) Assessment: Unchanged Well controlled. Cont curr meds. Her updated medication list for this problem includes:    Zoloft 100 Mg Tabs (Sertraline hcl) .Marland Kitchen... 1 1/2 tablets by mouth once daily    Wellbutrin Sr 150 Mg Xr12h-tab (Bupropion hcl) .Marland Kitchen... 1 tablet  twice a day by mouth  Problem # 10:  ANXIETY (ICD-300.00) Assessment: Unchanged Stable. Her updated medication list for this problem includes:    Zoloft 100 Mg Tabs (Sertraline hcl) .Marland Kitchen... 1 1/2 tablets by mouth once daily    Wellbutrin Sr 150 Mg Xr12h-tab (Bupropion hcl) .Marland Kitchen... 1 tablet  twice a day by mouth  Complete Medication List: 1)  Klor-con M20 20 Meq Tbcr (Potassium chloride crys cr) .... Take 1 tablet by mouth once a day 2)  Dyazide 37.5-25 Mg Caps (Triamterene-hctz) .... Take 1 by mouth every day 3)  Zoloft 100 Mg Tabs (Sertraline hcl) .Marland Kitchen.. 1 1/2 tablets by mouth once daily 4)  Spiriva Handihaler 18 Mcg Caps (Tiotropium bromide monohydrate) .... Use once a day 5)  Wellbutrin Sr 150 Mg Xr12h-tab (Bupropion hcl) .Marland Kitchen.. 1 tablet  twice a day by mouth 6)  Symbicort 160-4.5 Mcg/act Aero (Budesonide-formoterol fumarate) .... Two puffs twice daily 7)  Metoprolol Tartrate 25  Mg Tabs (Metoprolol tartrate) .Marland Kitchen.. 1 twice a day by mouth 8)  Oxygen At 2 Liters Per Minute At Bedtime  9)  Furosemide 40 Mg Tabs (Furosemide) .... One tab by mouth in am 10)  Pravachol 80 Mg Tabs (Pravastatin sodium) .... One ta b by mouth at night. 11)  Cozaar 50 Mg Tabs (Losartan potassium) .Marland Kitchen.. 1 daily by mouth 12)  Clobetasol Propionate E 0.05 % Crea (Clobetasol prop emollient base) .... Apply to area two times a day as needed 13)  Vitamin D 2000 Unit Tabs (Cholecalciferol) .Marland Kitchen.. 1 a day by mouth 14)  Moviprep 100 Gm Solr (Peg-kcl-nacl-nasulf-na asc-c) .... As per prep instructions. 15)  Omeprazole 40 Mg Cpdr (Omeprazole) .... Take one by mouth daily 16)  Guaifenesin 400 Mg Tabs (Guaifenesin) .... Take 1 and 1/2 by mouth two times a day as needed  17)  Nitrofurantoin Monohyd Macro 100 Mg Caps (Nitrofurantoin monohyd macro) .... Take one by mouth daily  Patient Instructions: 1)  Refer to [facility] and Cardiology for preop clearance. 2)  Pt cleared for surgery by me pending Cardiology and Pulmonary clearance.  Current Allergies (reviewed today): ! PENICILLIN G POTASSIUM   Pneumovax Immunization History:    Pneumovax # 1:  Pneumovax (02/10/2009)

## 2010-06-23 NOTE — Progress Notes (Signed)
Summary: refill request for gabapentin  Phone Note Refill Request Message from:  Fax from Pharmacy  Refills Requested: Medication #1:  gabapentin 400 mg   Last Refilled: 10/24/2009 Faxed request from [facility] is on your desk.  This is not on med list.  Initial call taken by: Lowella Petties CMA,  January 05, 2010 5:16 PM  Follow-up for Phone Call        patient was sent home from the hosptial on this.  please verify with patient that she is still on it and that it is helping.  Follow-up by: Crawford Givens MD,  January 05, 2010 8:13 PM  Additional Follow-up for Phone Call Additional follow up Details #1::        Patient says she had it refilled once but she no longer needs it.  Refill denied to pharmacy. Additional Follow-up by: Delilah Shan CMA (AAMA),  January 06, 2010 9:12 AM

## 2010-06-23 NOTE — Progress Notes (Signed)
Summary: SYMBICORT REFILL  Phone Note Call from Patient Call back at [facility] Phone [phone]   Caller: Patient Call For: SOOD Summary of Call: PT SAYS THAT [facility] HAS FAXED A REFILL REQUEST FOR SYMBICORT. PLS CALL THE PHARMACY FOR PT. (PT ALREADY HAS AN ROV SCHEDULED FOR 12/8 W/ SOOD) Initial call taken by: Tivis Ringer, CNA,  April 14, 2010 11:01 AM  Follow-up for Phone Call        refill sent. pt aware.Carron Curie CMA  April 14, 2010 11:07 AM     Prescriptions: SYMBICORT 160-4.5 MCG/ACT  AERO (BUDESONIDE-FORMOTEROL FUMARATE) Two puffs twice daily  #1 x 2   Entered by:   Carron Curie CMA   Authorized by:   [name] MD   Signed by:   Carron Curie CMA on 04/14/2010   Method used:   Electronically to        [facility]* (retail)       [address] [place], [place]  [address]       Ph: [phone]       Fax: [phone]   RxID:   [id number]

## 2010-06-23 NOTE — Letter (Signed)
Summary: Previsit letter  [facility]  [address] [place], [place] [address]   Phone: [phone]  Fax: [phone]       08/20/2009 MRN: [id number]  [facility] [address] [place], [place]  [address]  Dear Ms. Orlich,  Welcome to the Gastroenterology Division at [facility].    You are scheduled to see a nurse for your pre-procedure visit on 09-24-09 at 10:00a.m. on the 3rd floor at [facility], [address]  We ask that you try to arrive at our office 15 minutes prior to your appointment time to allow for check-in.  Your nurse visit will consist of discussing your medical and surgical history, your immediate family medical history, and your medications.    Please bring a complete list of all your medications or, if you prefer, bring the medication bottles and we will list them.  We will need to be aware of both prescribed and over the counter drugs.  We will need to know exact dosage information as well.  If you are on blood thinners (Coumadin, Plavix, Aggrenox, Ticlid, etc.) please call our office today/prior to your appointment, as we need to consult with your physician about holding your medication.   Please be prepared to read and sign documents such as consent forms, a financial agreement, and acknowledgement forms.  If necessary, and with your consent, a friend or relative is welcome to sit-in on the nurse visit with you.  Please bring your insurance card so that we may make a copy of it.  If your insurance requires a referral to see a specialist, please bring your referral form from your primary care physician.  No co-pay is required for this nurse visit.     If you cannot keep your appointment, please call [phone] to cancel or reschedule prior to your appointment date.  This allows [place] the opportunity to schedule an appointment for another patient in need of care.    Thank you for choosing [facility] for your medical  needs.  We appreciate the opportunity to care for you.  Please visit [place] at our website  to learn more about our practice.                     Sincerely.                                                                                                                   The Gastroenterology Division

## 2010-06-23 NOTE — Letter (Signed)
[facility]   Imported By: Beau Fanny 10/21/2009 16:51:56  _____________________________________________________________________  External Attachment:    Type:   Image     Comment:   External Document  Appended Document: [facility]     Clinical Lists Changes  Observations: Added new observation of PAST SURG HX: BTL Bladder Surgery L Breast Biopsy 08/09/97 Ba. Swallow, H. H. negative reflux 03/99 Hosp Klebsiella Pneumonia Sepsis due to Gum Surgery 2000 Spinal Abscess removed due to Sciatica 09/1998 MRI L/S, severe degen change L5/S1, 06/19/99 CT abd. wnl, 10/08/98 BE, diverticulia 10/06/98 CT, L/S area, Disc dz L5/S1, R-S1 nerve root positive 09/30/98 Cardiolite wnl EF 74% 07/12/01 TAH BSO, vag repair secondary prolapse 07/18/01 Colonoscopy, polyps, F/U 3 years, Bx. negative 08/28/02 EGD, GERD stricture H. H. 09/10/02 DEXA, [facility], wnl 01/17/03 Abd/Pelvic CT 2 sm cystic lesions in liver Uterus surg absent Divertics in Sigmoid 01/2007 Cystoscopy  nml 02/11/2007 CT Chest 1.9x0.8cm anterior pleural based lesion (Dr Craige Cotta) 02/06/2007 ECHO NML LVF Mild ASclerosis, Mild Pulm Htn CT ABD/ Pelvis Diverticulosis Mod Atherosclerotic Dz ([facility], Dr Achilles Dunk) 09/04/2008 Cystoscopy Nml (Dr Achilles Dunk) 5/5 2010 Choley 02/10/2009 Colonoscopy Polyps x 3  Divertics  Stenosis of Sigmoid (Dr Marina Goodell) 10/07/2009 CT Abd  No Sigmoid Mass DDD L5/S1  10/09/2009 HOSP RKR Postop wheezing and SOB 5/25-5/31/2011 LE Dopplers and Chest CT  No PE 10/15/2009 (10/22/2009 7:33)       Past Surgical History:    BTL    Bladder Surgery    L Breast Biopsy 08/09/97    Ba. Swallow, H. H. negative reflux 03/99    Hosp Klebsiella Pneumonia Sepsis due to Gum Surgery 2000    Spinal Abscess removed due to Sciatica 09/1998    MRI L/S, severe degen change L5/S1, 06/19/99    CT abd. wnl, 10/08/98    BE, diverticulia 10/06/98    CT, L/S area, Disc dz L5/S1, R-S1 nerve root positive 09/30/98    Cardiolite wnl EF 74%  07/12/01    TAH BSO, vag repair secondary prolapse 07/18/01    Colonoscopy, polyps, F/U 3 years, Bx. negative 08/28/02    EGD, GERD stricture H. H. 09/10/02    DEXA, [facility], wnl 01/17/03    Abd/Pelvic CT 2 sm cystic lesions in liver Uterus surg absent Divertics in Sigmoid 01/2007    Cystoscopy  nml 02/11/2007    CT Chest 1.9x0.8cm anterior pleural based lesion (Dr Craige Cotta) 02/06/2007    ECHO NML LVF Mild ASclerosis, Mild Pulm Htn    CT ABD/ Pelvis Diverticulosis Mod Atherosclerotic Dz ([facility], Dr Achilles Dunk) 09/04/2008    Cystoscopy Nml (Dr Achilles Dunk) 5/5 2010    Choley 02/10/2009    Colonoscopy Polyps x 3  Divertics  Stenosis of Sigmoid (Dr Marina Goodell) 10/07/2009    CT Abd  No Sigmoid Mass DDD L5/S1  10/09/2009    HOSP RKR Postop wheezing and SOB 5/25-5/31/2011    LE Dopplers and Chest CT  No PE 10/15/2009  Appended Document: [facility] Please refer to Pulm for assessment of O2 need and orders for titration post surgery/hospitalization.

## 2010-06-23 NOTE — Miscellaneous (Signed)
Summary: Oxygen Conserving Device Testing/HCS  Oxygen Conserving Device Testing/HCS   Imported By: Sherian Rein 12/03/2009 07:41:44  _____________________________________________________________________  External Attachment:    Type:   Image     Comment:   External Document

## 2010-06-23 NOTE — Assessment & Plan Note (Signed)
Summary: CONGESTION/DLO   Vital Signs:  Patient profile:   70 year old female Height:      64 inches Weight:      170.50 pounds BMI:     29.37 O2 Sat:      93 % on Room air Temp:     98.2 degrees F oral Pulse rate:   72 / minute Pulse rhythm:   regular Resp:     20 per minute BP sitting:   138 / 82  (left arm) Cuff size:   regular  Vitals Entered By: Lewanda Rife LPN (February 26, 2010 12:30 PM)  O2 Flow:  Room air CC: head and chest congestion,non productive cough, scratchy throat and h/a   History of Present Illness: CC: congestion  70yo with bad COPD O2 dependent (2L at baseline, 3 L with exertion) presents with 1 d h/o cold in chest and head.  + achey all over, ST, cough (dry).  Normally doesn't have cough.  + sinuses running.  Thinks may have had fever/chill last night but none today.  + ear popping.  No abd pain, n/v/d.  No tooth pain or ear pain.  No SOB or increased sputum production.  Husband sick, no one else sick at home.  No smokers at home (pt quit).  Hasn't had flu shot yet.  currently not on O2.  Current Medications (verified): 1)  Symbicort 160-4.5 Mcg/act  Aero (Budesonide-Formoterol Fumarate) .... Two Puffs Twice Daily 2)  Spiriva Handihaler 18 Mcg  Caps (Tiotropium Bromide Monohydrate) .... One Puff Every Other Day 3)  Proair Hfa 108 (90 Base) Mcg/act Aers (Albuterol Sulfate) .... Two Puffs Up To Four Times Per Day As Needed 4)  Cozaar 50 Mg Tabs (Losartan Potassium) .Marland Kitchen.. 1 Daily By Mouth 5)  Metoprolol Tartrate 25 Mg Tabs (Metoprolol Tartrate) .Marland Kitchen.. 1 Twice A Day By Mouth 6)  Pravachol 80 Mg Tabs (Pravastatin Sodium) .... One Ta B By Mouth At Night. 7)  Omeprazole 40 Mg Cpdr (Omeprazole) .... Take One By Mouth Daily 8)  Zoloft 100 Mg  Tabs (Sertraline Hcl) .Marland Kitchen.. 1 By Mouth Daily 9)  Wellbutrin Sr 150 Mg  Xr12h-Tab (Bupropion Hcl) .Marland Kitchen.. 1 Tablet  Twice A Day By Mouth 10)  Guaifenesin 400 Mg Tabs (Guaifenesin) .... Take 1 and 1/2 By Mouth Two Times A Day As  Needed 11)  Oxygen .Marland Kitchen.. 3liters By Nasal Cannula 12)  Nitrofurantoin Monohyd Macro 100 Mg Caps (Nitrofurantoin Monohyd Macro) .... Take 1 Capsule By Mouth Once A Day 13)  Iron 65mg  .... Take 1 Tablet By Mouth Once A Day 14)  Coq10 30 Mg Caps (Coenzyme Q10) .... Take 1 Capsule By Mouth Once A Day  Allergies: 1)  ! Pcn  Past History:  Past Medical History: Last updated: 01/12/2010 Diverticular Disease Peptic Ulcer Disease GERD Hiatal hernia Dyslipidemia HTN Urinary tract infections Hematuria Hyperglycemia Urinary incontinence Severe COPD with emphysema      - PFT 01/27/07 FEV1 1.22(57%), FEV1% 31, TLC 6.18 (127%), DLCO 60%, no BD Hypoxemia      - uses 2 liters oxygen at rest and sleep, 3 liters with exertion Lung cancer 1987 Spinal abscess 2000 secondary to periodontal disease Osteoarthritis Depression Anxiety  Review of Systems       per HPI  Physical Exam  General:  Well-developed,well-nourished,in no acute distress; alert,appropriate and cooperative throughout examination, overweight.  not wearing o2 Head:  Normocephalic and atraumatic without obvious abnormalities. No apparent alopecia or balding. Sinuses nontender. Eyes:  Conjunctiva clear bilaterally.  No icterus. Ears:  External ear exam shows no significant lesions or deformities.  Otoscopic examination reveals clear canals, tympanic membranes are intact bilaterally without bulging, retraction, inflammation or discharge. Hearing is grossly normal bilaterally. Nose:  External nasal examination shows no deformity or inflammation. Nasal mucosa are pink and moist without lesions or exudates. Mouth:  Oral mucosa and oropharynx without lesions or exudates.  Teeth in good repair.  slight pharyngeal erythema Neck:  No deformities, masses, or tenderness noted. Lungs:  Normal respiratory effort, chest expands symmetrically. Lungs are clear to auscultation, no crackles or wheezes Heart:  Normal rate and regular rhythm. S1  and S2 normal without gallop, murmur, click, rub or other extra sounds. Pulses:  2+ rad pulses Extremities:  no c/c/e   Impression & Recommendations:  Problem # 1:  COUGH (ICD-786.2) with chest congestion.  Does not meet criteria for COPD exacerbation (only with increased dry cough), doesn't fit clinical picture of influenza, O2 sat actually good for patient, off oxygen today.  clinically looks very stable.  discussed hesitancy to give abx currently as nothing to treat.  discussed supportive care for now (rest, fluids, guaifenesin), call on Monday with update, to return if deteriorating as would want re evaluation.  Complete Medication List: 1)  Symbicort 160-4.5 Mcg/act Aero (Budesonide-formoterol fumarate) .... Two puffs twice daily 2)  Spiriva Handihaler 18 Mcg Caps (Tiotropium bromide monohydrate) .... One puff every other day 3)  Proair Hfa 108 (90 Base) Mcg/act Aers (Albuterol sulfate) .... Two puffs up to four times per day as needed 4)  Cozaar 50 Mg Tabs (Losartan potassium) .Marland Kitchen.. 1 daily by mouth 5)  Metoprolol Tartrate 25 Mg Tabs (Metoprolol tartrate) .Marland Kitchen.. 1 twice a day by mouth 6)  Pravachol 80 Mg Tabs (Pravastatin sodium) .... One ta b by mouth at night. 7)  Omeprazole 40 Mg Cpdr (Omeprazole) .... Take one by mouth daily 8)  Zoloft 100 Mg Tabs (Sertraline hcl) .Marland Kitchen.. 1 by mouth daily 9)  Wellbutrin Sr 150 Mg Xr12h-tab (Bupropion hcl) .Marland Kitchen.. 1 tablet  twice a day by mouth 10)  Guaifenesin 400 Mg Tabs (Guaifenesin) .... Take 1 and 1/2 by mouth two times a day as needed 11)  Oxygen  .Marland Kitchen.. 3liters by nasal cannula 12)  Nitrofurantoin Monohyd Macro 100 Mg Caps (Nitrofurantoin monohyd macro) .... Take 1 capsule by mouth once a day 13)  Iron 65mg   .... Take 1 tablet by mouth once a day 14)  Coq10 30 Mg Caps (Coenzyme q10) .... Take 1 capsule by mouth once a day  Patient Instructions: 1)  Sounds like you have a viral upper respiratory infection. 2)  Antibiotics are not needed for this.   Viral infections usually take 7-10 days to resolve.  The cough can last up to 4-6 weeks to go away. 3)  Use medication as prescribed: guaifenesin IR 400mg  1 1/2 pills in am and at noon with fluid to help cough. 4)  Please return if you are not improving as expected, or if you have high fevers (>101.5) or difficulty swallowing, or worsening cough or trouble breathing. 5)  Call clinic with questions.  Pleasure to see you today!   Current Allergies (reviewed today): ! PCN

## 2010-06-23 NOTE — Assessment & Plan Note (Signed)
Summary: SURGERY CLEARNACE///kp   Copy to:  Deeann Saint [facility] Primary Provider/Referring Provider:  Shaune Leeks MD  CC:  Surgical clearance for right knee replacement.  History of Present Illness: I saw Ms. Gelardi for pre-operative pulmonary evaluation.  I last saw Ms. Czaja in 2009.  I saw her then for pre-operative evaluation for bladder sling.  She tolerated that procedure well.  She also had cholecystectomy in 2010, and had no respiratory problems from that procedure either.  She is now scheduled to have right knee replacement with Dr. Hyacinth Meeker.  She has occasional cough with clear to green sputum.  She denies hemoptysis, chest pain, or palpitations.  She does not have much wheeze.  She does okay with her breathing when walking on level ground, but does get winded easily when going up stairs.  She does not have swelling in her legs.  She quit smoking in 1997.  She uses her spiriva once daily, and symbicort two times a day.  She sometimes forgets to use her symbicort, and has not used her proair for several months.  She continues to use supplemental oxygen at 2 liters with sleep.  PFT's from 01/27/07 showed FEV1 1.22L (57%), FVC 3.89 (132%), FEV1/FVC 31%, no significant bronchodilator response, TLC 6.18L (127%), RV 2.56L (136%), and DLCO 60%.    Chest xray from 09/14/07 showed stable changes from previous right thoracotomy.   Current Medications (verified): 1)  Klor-Con M20 20 Meq Tbcr (Potassium Chloride Crys Cr) .... Take 1 Tablet By Mouth Once A Day 2)  Dyazide 37.5-25 Mg Caps (Triamterene-Hctz) .... Take 1 By Mouth Every Day 3)  Zoloft 100 Mg  Tabs (Sertraline Hcl) .Marland Kitchen.. 1 1/2 Tablets By Mouth Once Daily 4)  Spiriva Handihaler 18 Mcg  Caps (Tiotropium Bromide Monohydrate) .... Use Once A Day 5)  Wellbutrin Sr 150 Mg  Xr12h-Tab (Bupropion Hcl) .Marland Kitchen.. 1 Tablet  Twice A Day By Mouth 6)  Symbicort 160-4.5 Mcg/act  Aero (Budesonide-Formoterol Fumarate) ....  Two Puffs Twice Daily 7)  Metoprolol Tartrate 25 Mg Tabs (Metoprolol Tartrate) .Marland Kitchen.. 1 Twice A Day By Mouth 8)  Oxygen At 2 Liters Per Minute At Bedtime 9)  Furosemide 40 Mg Tabs (Furosemide) .... One Tab By Mouth in Am 10)  Pravachol 80 Mg Tabs (Pravastatin Sodium) .... One Ta B By Mouth At Night. 11)  Cozaar 50 Mg Tabs (Losartan Potassium) .Marland Kitchen.. 1 Daily By Mouth 12)  Clobetasol Propionate E 0.05 % Crea (Clobetasol Prop Emollient Base) .... Apply To Area Two Times A Day As Needed 13)  Vitamin D 2000 Unit Tabs (Cholecalciferol) .Marland Kitchen.. 1 A Day By Mouth 14)  Omeprazole 40 Mg Cpdr (Omeprazole) .... Take One By Mouth Daily 15)  Guaifenesin 400 Mg Tabs (Guaifenesin) .... Take 1 and 1/2 By Mouth Two Times A Day As Needed 16)  Nitrofurantoin Monohyd Macro 100 Mg Caps (Nitrofurantoin Monohyd Macro) .... Take One By Mouth Daily  Allergies: 1)  ! Pcn  Past History:  Past Medical History: Diverticular Disease Peptic Ulcer Disease GERD Hiatal hernia Dyslipidemia HTN Urinary tract infections Hematuria Hyperglycemia Urinary incontinence Severe COPD with emphysema      - PFT 01/27/07 FEV1 1.22(57%), FEV1% 31, TLC 6.18 (127%), DLCO 60%, no BD Hypoxemia      - uses 2 liters oxygen at night Lung cancer 1987 Spinal abscess 2000 secondary to periodontal disease Osteoarthritis Depression Anxiety  Past Surgical History: Reviewed history from 10/02/2009 and no changes required. BTL Bladder Surgery L Breast Biopsy 08/09/97 Ba. Swallow,  H. H. negative reflux 03/99 Hosp Klebsiella Pneumonia Sepsis due to Gum Surgery 2000 Spinal Abscess removed due to Sciatica 09/1998 MRI L/S, severe degen change L5/S1, 06/19/99 CT abd. wnl, 10/08/98 BE, diverticulia 10/06/98 CT, L/S area, Disc dz L5/S1, R-S1 nerve root positive 09/30/98 Cardiolite wnl EF 74% 07/12/01 TAH BSO, vag repair secondary prolapse 07/18/01 Colonoscopy, polyps, F/U 3 years, Bx. negative 08/28/02 EGD, GERD stricture H. H. 09/10/02 DEXA,  [facility], wnl 01/17/03 Abd/Pelvic CT 2 sm cystic lesions in liver Uterus surg absent Divertics in Sigmoid 01/2007 Cystoscopy  nml 02/11/2007 CT Chest 1.9x0.8cm anterior pleural based lesion (Dr Craige Cotta) 02/06/2007 ECHO NML LVF Mild ASclerosis, Mild Pulm Htn CT ABD/ Pelvis Diverticulosis Mod Atherosclerotic Dz ([facility], Dr Achilles Dunk) 09/04/2008 Cystoscopy Nml (Dr Achilles Dunk) 5/5 2010 Choley 02/10/2009  Family History: Reviewed history from 10/02/2009 and no changes required. Father: Died at age 110 of emphysema and heart disease from smoking and hypertension. Mother: dec 96 Sepsis due UTI  Htn poor health S/P hip fracture, bowel obstruction recently, and pancreatitis with gangrene from the bowel. Brother  A 91 Brother A 73  Arthritis Htn    Social History: Reviewed history from 02/01/2008 and no changes required. Marital Status: Remarried for the third time, 2nd husband died of heart attack. Children: Two biologic children and two stepsons Occupation: Prior Print production planner at [employer]   Vital Signs:  Patient profile:   70 year old female Height:      64 inches Weight:      168 pounds O2 Sat:      91 % on Room air Temp:     98.1 degrees F oral Pulse rate:   72 / minute BP sitting:   128 / 80  (left arm) Cuff size:   regular  Vitals Entered By: Carver Fila SMA (Oct 10, 2009 11:44 AM)  O2 Flow:  Room air CC: Surgical clearance for right knee replacement Comments Meds and allergies updated Daytime phone number verified with patient. Carron Curie CMA  Oct 10, 2009 11:45 AM    Physical Exam  General:  normal appearance, healthy appearing, and obese.   Nose:  External nasal examination shows no deformity or inflammation. Nasal mucosa are pink and moist without lesions or exudates. Mouth:  Oral mucosa and oropharynx without lesions or exudates.  Teeth in good repair. Neck:  No deformities, masses, or tenderness noted. Lungs:  Normal respiratory effort, chest expands symmetrically.  Lungs are clear to auscultation, no crackles or wheezes  Heart:  Normal rate and regular rhythm. S1 and S2 normal without gallop, murmur, click, rub or other extra sounds. Abdomen:  Bowel sounds positive,abdomen soft and non-tender without masses, organomegaly or hernias noted. Pulses:  pulses normal Extremities:  No clubbing, cyanosis, edema, or deformity noted with normal full range of motion of all joints except right knee.  R knee in addition slightly swollen, nonerythematous and not hot. Neurologic:  No cranial nerve deficits noted. Station and gait are normal. Sensory, motor and coordinative functions appear intact. Cervical Nodes:  No lymphadenopathy noted   Impression & Recommendations:  Problem # 1:  PRE-OPERATIVE RESPIRATORY EXAMINATION (ICD-V72.82) She has severe COPD/emphysema and nocturnal hypoxemia.  Her pulmonary status is well compensated on this regimen.  She is scheduled to have right knee replacement.  I do not think that she has any pulmonary contra-indications to having surgery, and advised her that she can proceed with surgery.  I did explain to her that she is at higher risk of peri-operative pulmonary complications  due to her COPD.  She will need to continue on her inhaler regimen during the peri-operative period, including prolonged stay on ventilator and pneumonia.  She will need to have close monitoring of her oxygenation, and support equipment to support her airway should be readily available.    Of note is that she tolerated cholecystectomy in September 2010 without any respiratory complications.  Problem # 2:  COPD (ICD-496) She is stable on her current inhaler regimen.  Advised her to continue her current regimen, and follow up with pulmonary as needed if she has problems.  Problem # 3:  HYPOXEMIA (ICD-799.02) She is to continue on nocturnal oxgen.  Medications Added to Medication List This Visit: 1)  Proair Hfa 108 (90 Base) Mcg/act Aers (Albuterol sulfate)  .... Two puffs up to four times per day as needed  Complete Medication List: 1)  Symbicort 160-4.5 Mcg/act Aero (Budesonide-formoterol fumarate) .... Two puffs twice daily 2)  Spiriva Handihaler 18 Mcg Caps (Tiotropium bromide monohydrate) .... Use once a day 3)  Proair Hfa 108 (90 Base) Mcg/act Aers (Albuterol sulfate) .... Two puffs up to four times per day as needed 4)  Oxygen At 2 Liters Per Minute At Bedtime  5)  Klor-con M20 20 Meq Tbcr (Potassium chloride crys cr) .... Take 1 tablet by mouth once a day 6)  Dyazide 37.5-25 Mg Caps (Triamterene-hctz) .... Take 1 by mouth every day 7)  Zoloft 100 Mg Tabs (Sertraline hcl) .Marland Kitchen.. 1 1/2 tablets by mouth once daily 8)  Wellbutrin Sr 150 Mg Xr12h-tab (Bupropion hcl) .Marland Kitchen.. 1 tablet  twice a day by mouth 9)  Metoprolol Tartrate 25 Mg Tabs (Metoprolol tartrate) .Marland Kitchen.. 1 twice a day by mouth 10)  Furosemide 40 Mg Tabs (Furosemide) .... One tab by mouth in am 11)  Pravachol 80 Mg Tabs (Pravastatin sodium) .... One ta b by mouth at night. 12)  Cozaar 50 Mg Tabs (Losartan potassium) .Marland Kitchen.. 1 daily by mouth 13)  Clobetasol Propionate E 0.05 % Crea (Clobetasol prop emollient base) .... Apply to area two times a day as needed 14)  Vitamin D 2000 Unit Tabs (Cholecalciferol) .Marland Kitchen.. 1 a day by mouth 15)  Omeprazole 40 Mg Cpdr (Omeprazole) .... Take one by mouth daily 16)  Guaifenesin 400 Mg Tabs (Guaifenesin) .... Take 1 and 1/2 by mouth two times a day as needed 17)  Nitrofurantoin Monohyd Macro 100 Mg Caps (Nitrofurantoin monohyd macro) .... Take one by mouth daily  Other Orders: Est. Patient Level IV (86578)  Patient Instructions: 1)  Ok to proceed with knee surgery 2)  Follow up with pulmonary as needed     Appended Document: SURGERY CLEARNACE///kp OV note faxed to [facility] @ [phone].

## 2010-06-23 NOTE — Letter (Signed)
Summary: CMN/HCS  CMN/HCS   Imported By: Lester Poinsett 11/25/2009 08:23:56  _____________________________________________________________________  External Attachment:    Type:   Image     Comment:   External Document

## 2010-06-23 NOTE — Progress Notes (Signed)
Summary: Ok for 02 2lpm 24h per day until next ov only  Phone Note Call from Patient Call back at [facility] Phone [phone]   Caller: Patient Call For: sood Reason for Call: Talk to Nurse Summary of Call: recently had knee surgery.  Was on 02 at night.  Since surgery, her 02 need has been higher - need an order sent to [facility] for back up and portable oxygen.  Need concentrater too. Initial call taken by: Eugene Gavia,  October 31, 2009 9:01 AM  Follow-up for Phone Call        pt was originally on 2 liters oxygen at night only, but she states since her surgery she has been having incresed SOB with activity, states her oxygen levels will drop to low 80's with activity. Sh eis requesting to have an order palced for portable oxygen to use with activity. Sh ealso states she needs an order for a new concentrator becasue hers is very old and not working properly. Please advise if ok to send order. Carron Curie CMA  October 31, 2009 9:35 AM ok with me to write new rx for weekend but really needs ov with Craige Cotta to regroup on 02 needs  Follow-up by: Nyoka Cowden MD,  October 31, 2009 11:26 AM  Additional Follow-up for Phone Call Additional follow up Details #1::        PT ADVISED AND APPT SET FOR 11-13-09 AT 11:45. How exactly do you want the order stated? Please advise.  Carron Curie CMA  October 31, 2009 11:53 AM 02 2lpm 24 hours a day Additional Follow-up by: Nyoka Cowden MD,  October 31, 2009 12:45 PM    Additional Follow-up for Phone Call Additional follow up Details #2::    orders placed. pt aware.  Carron Curie CMA  October 31, 2009 2:04 PM

## 2010-06-23 NOTE — Consult Note (Signed)
Summary: Dr.Sital Mody,[facility]  Dr.Sital Mody,[facility]   Imported By: Beau Fanny 10/16/2009 10:42:46  _____________________________________________________________________  External Attachment:    Type:   Image     Comment:   External Document

## 2010-06-23 NOTE — Miscellaneous (Signed)
Summary: Overnight oximetry on 2 liters oxygen   Clinical Lists Changes Test time 9hrs 35 min.  Mean SpO2 98%, low 87%.  Spent 36 sec (0.1%) with SpO2 < 88%.  Will have my nurse call to inform pt that oxygen level is good with 2 liters oxygen.  No change to current set up needed of 2 liters oxygen with exertion and sleep.  Appended Document: Overnight oximetry on 2 liters oxygen The patient is aware and had verbal understanding that she needs to stay on 2 liters with exertion and while sleeping. She is sch for f/u on 01/12/2010.

## 2010-06-23 NOTE — Progress Notes (Signed)
  Phone Note Other Incoming   Request: Send information Summary of Call: Records received from [facility]. 30 pages forwarded to Dr. Craige Cotta for review.

## 2010-06-23 NOTE — Miscellaneous (Signed)
Summary: LAb order  Clinical Lists Changes  Orders: Added new Test order of TLB-BMP (Basic Metabolic Panel-BMET) (80048-METABOL) - Signed

## 2010-06-23 NOTE — Progress Notes (Signed)
Summary: refill requests for celebrex, gabapentin  Phone Note Refill Request Message from:  Fax from Pharmacy  Refills Requested: Medication #1:  CELEBREX 200 MG CAPS 1 by mouth two times a day.   Last Refilled: 10/24/2009  Medication #2:  Gabapentin 400 mg   Last Refilled: 10/24/2009  Medication #3:  COZAAR 50 MG TABS 1 daily by mouth Faxed request from [facility].  Gabapentin is not on med list.  Initial call taken by: Lowella Petties CMA,  November 25, 2009 9:28 AM  Follow-up for Phone Call        OK to fill celebrex and cozaar.  please call and verify the originating MD on the gabapentin rx.  If it was done through this office, please get the date of the original rx.  Follow-up by: Crawford Givens MD,  November 25, 2009 2:44 PM  Additional Follow-up for Phone Call Additional follow up Details #1::        Pharmacy says it was filled on 11/21/2009 with Einar Crow, M.D.  Left message on voicemail for patient  to return call. Lugene Fuquay CMA Jaleeah Slight Dull)  November 25, 2009 4:28 PM   Patient was sent home from the hospital on this medication.  She did not request that it be refilled.  Lugene Fuquay CMA Dierra Riesgo Dull)  November 26, 2009 4:10 PM     Additional Follow-up for Phone Call Additional follow up Details #2::    Above noted, will not fill gabapentin unless requested by patient.  Follow-up by: Crawford Givens MD,  November 26, 2009 10:38 PM  Prescriptions: CELEBREX 200 MG CAPS (CELECOXIB) 1 by mouth two times a day  #60 x 5   Entered and Authorized by:   Crawford Givens MD   Signed by:   Crawford Givens MD on 11/25/2009   Method used:   Electronically to        [facility]* (retail)       [address] [place], [place]  [address]       Ph: [phone]       Fax: [phone]   RxID:   [id number] COZAAR 50 MG TABS (LOSARTAN POTASSIUM) 1 daily by mouth  #30 x 5   Entered and Authorized by:   Crawford Givens MD   Signed by:   Crawford Givens MD on 11/25/2009  Method used:   Electronically to        [facility]* (retail)       [address] [place], [place]  [address]       Ph: [phone]       Fax: [phone]   RxID:   [id number]

## 2010-06-23 NOTE — Progress Notes (Signed)
Summary: surgical clearance  Phone Note From Other Clinic Call back at [phone]   Caller: Dr. Deeann Saint Call For: [facility] Summary of Call: Need surgical clearance from visit on 10/10/2009 faxed to  [phone] Attn: Julieta Gutting Initial call taken by: Eugene Gavia,  Oct 13, 2009 9:24 AM  Follow-up for Phone Call        DR [facility] OFFICE CALLED BACK SO I PRINTED THE LAST OFFICE NOTE AND FAXED IT ATTENTION JONI AT [phone] Follow-up by: Lacinda Axon,  Oct 13, 2009 4:30 PM

## 2010-06-23 NOTE — Miscellaneous (Signed)
Summary: [facility] Orders  [facility] Orders   Imported By: Beau Fanny 10/31/2009 12:43:33  _____________________________________________________________________  External Attachment:    Type:   Image     Comment:   External Document

## 2010-06-23 NOTE — Miscellaneous (Signed)
Summary: [facility] Previsit/prep  Clinical Lists Changes  Medications: Added new medication of MOVIPREP 100 GM  SOLR (PEG-KCL-NACL-NASULF-NA ASC-C) As per prep instructions. - Signed Rx of MOVIPREP 100 GM  SOLR (PEG-KCL-NACL-NASULF-NA ASC-C) As per prep instructions.;  #1 x 0;  Signed;  Entered by: Wyona Almas RN;  Authorized by: [name] MD;  Method used: Electronically to [facility]*, [address] [place], [place], [place]  [address], Ph: [phone], Fax: [phone] Observations: Added new observation of ALLERGY REV: Done (09/24/2009 10:07)    Prescriptions: MOVIPREP 100 GM  SOLR (PEG-KCL-NACL-NASULF-NA ASC-C) As per prep instructions.  #1 x 0   Entered by:   Wyona Almas RN   Authorized by:   [name] MD   Signed by:   Wyona Almas RN on 09/24/2009   Method used:   Electronically to        [facility]* (retail)       [address] [place], [place]  [address]       Ph: [phone]       Fax: [phone]   RxID:   [id number]

## 2010-06-23 NOTE — Progress Notes (Signed)
Summary: Letter of surgical clearance requested  Phone Note From Other Clinic Call back at phone [phone] and fax [phone]   Caller: Joni at Dr. Rondel Baton office Call For: Dr. Hetty Ely Summary of Call: Joni from Dr. Rondel Baton office called and requested a letter of clearance for rt knee replacement be added to existing appt for pt's CPX on 10/02/09.  Letter of clearance can be faxed to [phone] attention: Joni. Any questions or concerns can call Joni at [phone]. Thank you.Lewanda Rife LPN  August 30, 8655 2:34 PM  Initial call taken by: Lewanda Rife LPN,  August 29, 2009 2:34 PM  Follow-up for Phone Call        Noted. Follow-up by: Shaune Leeks MD,  September 01, 2009 8:28 AM

## 2010-06-23 NOTE — Procedures (Signed)
Summary: Colonoscopy  Patient: [name] [name] Note: All result statuses are Final unless otherwise noted.  Tests: (1) Colonoscopy (COL)   COL Colonoscopy           DONE     [facility]     [address]     [place], [place]  [address]           COLONOSCOPY PROCEDURE REPORT           PATIENT:  [name] [name], [name] [name]  MR#:  [id number]     BIRTHDATE:  [birth year], 68 yrs. old  GENDER:  female     ENDOSCOPIST:  Wilhemina Bonito. Eda Keys, MD     REF. BY:  Surveillance Program Recall (overdue),     PROCEDURE DATE:  10/07/2009     PROCEDURE:  Colonoscopy with snare polypectomy x 3     ASA CLASS:  Class II     INDICATIONS:  history of pre-cancerous (adenomatous) colon polyps     ; index exam 08-2002 w/ small adenomas. PATIENT OVERDUE FOR FOLLOW     UP. TELLS ME THAT SHE HAS BEEN HAVING LLQ PAIN THAT PROMPTED THIS     EXAM. SENT DIERCTLY.     MEDICATIONS:   Fentanyl 125 mcg IV, Versed 12 mg IV           DESCRIPTION OF PROCEDURE:   After the risks benefits and     alternatives of the procedure were thoroughly explained, informed     consent was obtained.  Digital rectal exam was performed and     revealed no abnormalities.   The LB CF-[id number] endoscope     was introduced through the anus and advanced to the cecum, which     was identified by both the appendix and ileocecal valve, without     limitations.TIME TO CECUM = 15:32 MIN.  The quality of the prep     was excellent, using MoviPrep.  The instrument was then slowly     withdrawn (TIME = 20:46 MIN) as the colon was fully examined.     <<PROCEDUREIMAGES>>           FINDINGS:  Three polyps were found; 4mm in the cecum and 4mm in     transverse colon were snared without cautery. Retrieval was     successful. A 12mm sessile ascending colon polyp was removed with     snare cautery  and retrieved. Moderate diverticulosis was found in     the left colon  with markes  stenosis in the sigmoid colon, making     the exam difficult.    Retroflexed views in the rectum revealed no     abnormalities.    The scope was then withdrawn from the patient     and the procedure completed.           COMPLICATIONS:  None     ENDOSCOPIC IMPRESSION:     1) Three polyps - removed     2) Moderate diverticulosis in the left colon     3) Stenosis in the sigmoid colon, presumably due to diverticular     disease     RECOMMENDATIONS:     1) Contrast enhancedCT Scan of the abdomen and pelvis "LLQ pain     and sigmoid stenosis, evaluate"     2) Office appt w/ Dr Marina Goodell in a few weeks, after CT completed     3) Follow up colonoscopy in 3 years (PEDS SCOPE)  ______________________________     Wilhemina Bonito. Eda Keys, MD           CC:  Arta Silence, MD;The Patient           n.     Rosalie DoctorWilhemina Bonito. Eda Keys at 10/07/2009 12:32 PM           Pensinger, Andrienne, [phone]  Note: An exclamation mark (!) indicates a result that was not dispersed into the flowsheet. Document Creation Date: 10/09/2009 5:07 PM _______________________________________________________________________  (1) Order result status: Final Collection or observation date-time: 10/07/2009 12:18 Requested date-time:  Receipt date-time:  Reported date-time:  Referring Physician:   Ordering Physician: Fransico Setters [phone]) Specimen Source:  Source: Launa Grill Order Number: [id number] Lab site:   Appended Document: Colonoscopy recall 3 yrs/09-2012     Procedures Next Due Date:    Colonoscopy: 09/2012  Appended Document: Colonoscopy     Clinical Lists Changes  Observations: Added new observation of PAST SURG HX: BTL Bladder Surgery L Breast Biopsy 08/09/97 Ba. Swallow, H. H. negative reflux 03/99 Hosp Klebsiella Pneumonia Sepsis due to Gum Surgery 2000 Spinal Abscess removed due to Sciatica 09/1998 MRI L/S, severe degen change L5/S1, 06/19/99 CT abd. wnl, 10/08/98 BE, diverticulia 10/06/98 CT, L/S area, Disc dz L5/S1, R-S1 nerve root positive  09/30/98 Cardiolite wnl EF 74% 07/12/01 TAH BSO, vag repair secondary prolapse 07/18/01 Colonoscopy, polyps, F/U 3 years, Bx. negative 08/28/02 EGD, GERD stricture H. H. 09/10/02 DEXA, [facility], wnl 01/17/03 Abd/Pelvic CT 2 sm cystic lesions in liver Uterus surg absent Divertics in Sigmoid 01/2007 Cystoscopy  nml 02/11/2007 CT Chest 1.9x0.8cm anterior pleural based lesion (Dr Craige Cotta) 02/06/2007 ECHO NML LVF Mild ASclerosis, Mild Pulm Htn CT ABD/ Pelvis Diverticulosis Mod Atherosclerotic Dz ([facility], Dr Achilles Dunk) 09/04/2008 Cystoscopy Nml (Dr Achilles Dunk) 5/5 2010 Choley 02/10/2009 Colonoscopy Polyps x 3  Divertics  Stenosis of Sigmoid (Dr Marina Goodell) 10/07/2009 CT Abd  No Sigmoid Mass DDD L5/S1  10/09/2009  (10/14/2009 13:54)       Past Surgical History:    BTL    Bladder Surgery    L Breast Biopsy 08/09/97    Ba. Swallow, H. H. negative reflux 03/99    Hosp Klebsiella Pneumonia Sepsis due to Gum Surgery 2000    Spinal Abscess removed due to Sciatica 09/1998    MRI L/S, severe degen change L5/S1, 06/19/99    CT abd. wnl, 10/08/98    BE, diverticulia 10/06/98    CT, L/S area, Disc dz L5/S1, R-S1 nerve root positive 09/30/98    Cardiolite wnl EF 74% 07/12/01    TAH BSO, vag repair secondary prolapse 07/18/01    Colonoscopy, polyps, F/U 3 years, Bx. negative 08/28/02    EGD, GERD stricture H. H. 09/10/02    DEXA, [facility], wnl 01/17/03    Abd/Pelvic CT 2 sm cystic lesions in liver Uterus surg absent Divertics in Sigmoid 01/2007    Cystoscopy  nml 02/11/2007    CT Chest 1.9x0.8cm anterior pleural based lesion (Dr Craige Cotta) 02/06/2007    ECHO NML LVF Mild ASclerosis, Mild Pulm Htn    CT ABD/ Pelvis Diverticulosis Mod Atherosclerotic Dz ([facility], Dr Achilles Dunk) 09/04/2008    Cystoscopy Nml (Dr Achilles Dunk) 5/5 2010    Choley 02/10/2009    Colonoscopy Polyps x 3  Divertics  Stenosis of Sigmoid (Dr Marina Goodell) 10/07/2009    CT Abd  No Sigmoid Mass DDD L5/S1  10/09/2009

## 2010-06-23 NOTE — Assessment & Plan Note (Signed)
Summary: requalify 02 needs all 3 test   Visit Type:  Follow-up Copy to:  [facility] Primary Provider/Referring Provider:  Shaune Leeks MD  CC:  COPD. Requalify for oxygen.[name] [name]  History of Present Illness: 70 yo female with COPD and hypoxemia.  She had knee surgery at [facility] May 25.  She had problems keeping her oxygen up after surgery.  CT chest and doppler of the legs were negative for PE or DVT.  She was told that her oxygen was low due to the affects of anesthesia.  She has been using oxygen 24 hours a day since she left the hospital.  She feels her breathing has been okay.  She is not having much cough, wheeze, or sputum.    She has noticed her oxygen running low at night when asleep.  Current Medications (verified): 1)  Symbicort 160-4.5 Mcg/act  Aero (Budesonide-Formoterol Fumarate) .... Two Puffs Twice Daily 2)  Spiriva Handihaler 18 Mcg  Caps (Tiotropium Bromide Monohydrate) .... Use Once A Day 3)  Proair Hfa 108 (90 Base) Mcg/act Aers (Albuterol Sulfate) .... Two Puffs Up To Four Times Per Day As Needed 4)  Oxygen At 2 Liters Per Minute At Bedtime 5)  Zoloft 100 Mg  Tabs (Sertraline Hcl) .[name] [name].. 1 By Mouth Daily 6)  Wellbutrin Sr 150 Mg  Xr12h-Tab (Bupropion Hcl) .[name] [name].. 1 Tablet  Twice A Day By Mouth 7)  Metoprolol Tartrate 25 Mg Tabs (Metoprolol Tartrate) .[name] [name].. 1 Twice A Day By Mouth 8)  Pravachol 80 Mg Tabs (Pravastatin Sodium) .... One Ta B By Mouth At Night. 9)  Cozaar 50 Mg Tabs (Losartan Potassium) .[name] [name].. 1 Daily By Mouth 10)  Omeprazole 40 Mg Cpdr (Omeprazole) .... Take One By Mouth Daily 11)  Guaifenesin 400 Mg Tabs (Guaifenesin) .... Take 1 and 1/2 By Mouth Two Times A Day As Needed 12)  Celebrex 200 Mg Caps (Celecoxib) .[name] [name].. 1 By Mouth Two Times A Day 13)  Ambien 5 Mg Tabs (Zolpidem Tartrate) .[name] [name].. 1 By Mouth At Bedtime As Needed 14)  Triamterene-Hctz 37.5-25 Mg Tabs (Triamterene-Hctz) .[name] [name].. 1 By Mouth Daily  Allergies (verified): 1)  !  Pcn  Past History:  Past Medical History: Diverticular Disease Peptic Ulcer Disease GERD Hiatal hernia Dyslipidemia HTN Urinary tract infections Hematuria Hyperglycemia Urinary incontinence Severe COPD with emphysema      - PFT 01/27/07 FEV1 1.22(57%), FEV1% 31, TLC 6.18 (127%), DLCO 60%, no BD Hypoxemia      - uses 2 liters oxygen with exertion and at night Lung cancer 1987 Spinal abscess 2000 secondary to periodontal disease Osteoarthritis Depression Anxiety  Past Surgical History: Reviewed history from 10/22/2009 and no changes required. BTL Bladder Surgery L Breast Biopsy 08/09/97 Ba. Swallow, H. H. negative reflux 03/99 Hosp Klebsiella Pneumonia Sepsis due to Gum Surgery 2000 Spinal Abscess removed due to Sciatica 09/1998 MRI L/S, severe degen change L5/S1, 06/19/99 CT abd. wnl, 10/08/98 BE, diverticulia 10/06/98 CT, L/S area, Disc dz L5/S1, R-S1 nerve root positive 09/30/98 Cardiolite wnl EF 74% 07/12/01 TAH BSO, vag repair secondary prolapse 07/18/01 Colonoscopy, polyps, F/U 3 years, Bx. negative 08/28/02 EGD, GERD stricture H. H. 09/10/02 DEXA, [facility], wnl 01/17/03 Abd/Pelvic CT 2 sm cystic lesions in liver Uterus surg absent Divertics in Sigmoid 01/2007 Cystoscopy  nml 02/11/2007 CT Chest 1.9x0.8cm anterior pleural based lesion (Dr Craige Cotta) 02/06/2007 ECHO NML LVF Mild ASclerosis, Mild Pulm Htn CT ABD/ Pelvis Diverticulosis Mod Atherosclerotic Dz ([facility], Dr Achilles Dunk) 09/04/2008 Cystoscopy Nml (Dr Achilles Dunk) 5/5 2010 Choley 02/10/2009 Colonoscopy  Polyps x 3  Divertics  Stenosis of Sigmoid (Dr Marina Goodell) 10/07/2009 CT Abd  No Sigmoid Mass DDD L5/S1  10/09/2009 HOSP RKR Postop wheezing and SOB 5/25-5/31/2011 LE Dopplers and Chest CT  No PE 10/15/2009  Vital Signs:  Patient profile:   70 year old female Height:      64 inches (162.56 cm) Weight:      172 pounds (78.18 kg) BMI:     29.63 O2 Sat:      95 % on 2 L/min Temp:     98.0 degrees F (36.67 degrees C) oral Pulse rate:    73 / minute BP sitting:   102 / 64  (left arm) Cuff size:   regular  Vitals Entered By: Michel Bickers CMA (November 13, 2009 12:03 PM)  O2 Sat at Rest %:  95 O2 Flow:  2 L/min  Serial Vital Signs/Assessments:  Comments: 12:30 PM Ambulatory Pulse Oximetry  Resting; HR__67___    02 Sat__92% on room air___  Lap1 (185 feet)   HR__79___   02 Sat__85% on room air___ Lap2 (185 feet)   HR_____   02 Sat_____    Lap3 (185 feet)   HR_____   02 Sat_____  ___Test Completed without Difficulty __x_Test Stopped due to: patient recovered on 2 liters and sats were 91% on 2 liters and pulse was 75.  By: Michel Bickers CMA   CC: COPD. Requalify for oxygen. Comments Medications reviewed. Daytime phone verified. Michel Bickers [facility]  November 13, 2009 12:04 PM   Physical Exam  General:  normal appearance, healthy appearing, and obese.   Nose:  no sinus tenderness Mouth:  MP 3 Neck:  no JVD.   Lungs:  diminished breath sounds, no wheezing or rales Heart:  regular rhythm, normal rate, and no murmurs.   Extremities:  no edema Cervical Nodes:  no significant adenopathy   Impression & Recommendations:  Problem # 1:  COPD (ICD-496) She is to continue on spiriva and symbicort.  I explained to her that she can use her proair as needed.   Problem # 2:  HYPOXEMIA (ICD-799.02)  She is to continue on 2 liters with exertion and sleep.  Will set up overnight oximetry on 2 liters.  Medications Added to Medication List This Visit: 1)  Triamterene-hctz 37.5-25 Mg Tabs (Triamterene-hctz) .[name] [name].. 1 by mouth daily 2)  Zoloft 100 Mg Tabs (Sertraline hcl) .[name] [name].. 1 by mouth daily 3)  Ambien 5 Mg Tabs (Zolpidem tartrate) .[name] [name].. 1 by mouth at bedtime as needed 4)  Celebrex 200 Mg Caps (Celecoxib) .[name] [name].. 1 by mouth two times a day  Complete Medication List: 1)  Symbicort 160-4.5 Mcg/act Aero (Budesonide-formoterol fumarate) .... Two puffs twice daily 2)  Spiriva Handihaler 18 Mcg Caps (Tiotropium bromide monohydrate) .... Use  once a day 3)  Proair Hfa 108 (90 Base) Mcg/act Aers (Albuterol sulfate) .... Two puffs up to four times per day as needed 4)  Cozaar 50 Mg Tabs (Losartan potassium) .[name] [name].. 1 daily by mouth 5)  Triamterene-hctz 37.5-25 Mg Tabs (Triamterene-hctz) .[name] [name].. 1 by mouth daily 6)  Metoprolol Tartrate 25 Mg Tabs (Metoprolol tartrate) .[name] [name].. 1 twice a day by mouth 7)  Pravachol 80 Mg Tabs (Pravastatin sodium) .... One ta b by mouth at night. 8)  Omeprazole 40 Mg Cpdr (Omeprazole) .... Take one by mouth daily 9)  Zoloft 100 Mg Tabs (Sertraline hcl) .[name] [name].. 1 by mouth daily 10)  Wellbutrin Sr 150 Mg Xr12h-tab (Bupropion hcl) .[name] [name].. 1 tablet  twice a day by  mouth 11)  Ambien 5 Mg Tabs (Zolpidem tartrate) .[name] [name].. 1 by mouth at bedtime as needed 12)  Guaifenesin 400 Mg Tabs (Guaifenesin) .... Take 1 and 1/2 by mouth two times a day as needed 13)  Celebrex 200 Mg Caps (Celecoxib) .[name] [name].. 1 by mouth two times a day  Other Orders: Est. Patient Level IV (41324) DME Referral (DME)  Patient Instructions: 1)  Use oxygen at 2 liters with exertion and sleep 2)  Will arrange for oxygen test at night 3)  Will get copy of medical records from [place] 4)  Follow up in 2 months

## 2010-06-23 NOTE — Progress Notes (Signed)
Summary: cannulas  Phone Note Call from Patient   Caller: [facility] Call For: Shaune Leeks MD Summary of Call: Denny Peon from [facility] medical supply is requesting  rx for cnnulas to be faxed to [phone].  Initial call taken by: Melody Comas,  August 21, 2009 4:44 PM  Follow-up for Phone Call        What are we talking about here? And how many and how often are they used.  ?Nasal Cannulas? For O2?  Who prescribed? Shaune Leeks MD  August 21, 2009 4:48 PM  Follow-up by: Shaune Leeks MD,  August 21, 2009 4:49 PM  Additional Follow-up for Phone Call Additional follow up Details #1::        Yes the nasal cannulas for 02. The patient says that you prescribed. Denny Peon says that the patient does not get her oxygen from them, but came in needing some cannulas so only needs 2.  Additional Follow-up by: Melody Comas,  August 21, 2009 4:57 PM    Additional Follow-up for Phone Call Additional follow up Details #2::    Actually Dr Craige Cotta prescribed the O2, 02/13/08 via a CMN. Will write script for 2 Nasal Cannulas, use as needed. Shaune Leeks MD  August 21, 2009 5:12 PM     Rx for nasal cannulas faxed to [facility]. Follow-up by: Melody Comas,  August 21, 2009 5:27 PM

## 2010-06-23 NOTE — Assessment & Plan Note (Signed)
Summary: per reminder letter/mhh   Visit Type:  Follow-up Copy to:  Dr. Achilles Dunk, Dr. Willeen Cass Primary Provider/Referring Provider:  Shaune Leeks MD  CC:  COPD...pt c/o incr SOB w/ exertion...on Spetra for sinus infection;saw ENT Dr. Willeen Cass.  History of Present Illness: 70 yo female with GOLD 3 COPD and hypoxemia.  She was started on septra for a sinus infection 2 days ago.  She was having feverish feel and sinus pain.  She also had some more dyspnea and burning in her chest.  These have all improved.  She was having some nausea, but this is better.  She is using spiriva every other day, and symbicort two times a day.  She uses proair once daily.  She is on nitrofurantoin for chronic bladder infection by Dr. Assunta Gambles with urology.   Current Medications (verified): 1)  Symbicort 160-4.5 Mcg/act  Aero (Budesonide-Formoterol Fumarate) .... Two Puffs Twice Daily 2)  Spiriva Handihaler 18 Mcg  Caps (Tiotropium Bromide Monohydrate) .... One Puff Once Daily 3)  Proair Hfa 108 (90 Base) Mcg/act Aers (Albuterol Sulfate) .... Two Puffs Up To Four Times Per Day As Needed 4)  Cozaar 50 Mg Tabs (Losartan Potassium) .Marland Kitchen.. 1 Daily By Mouth 5)  Metoprolol Tartrate 25 Mg Tabs (Metoprolol Tartrate) .Marland Kitchen.. 1 Twice A Day By Mouth 6)  Pravachol 80 Mg Tabs (Pravastatin Sodium) .... One Ta B By Mouth At Night. 7)  Omeprazole 40 Mg Cpdr (Omeprazole) .... Take One By Mouth Daily 8)  Zoloft 100 Mg  Tabs (Sertraline Hcl) .Marland Kitchen.. 1 By Mouth Daily 9)  Wellbutrin Sr 150 Mg  Xr12h-Tab (Bupropion Hcl) .Marland Kitchen.. 1 Tablet  Twice A Day By Mouth 10)  Guaifenesin 400 Mg Tabs (Guaifenesin) .... Take 1 and 1/2 By Mouth Two Times A Day As Needed 11)  Oxygen .Marland Kitchen.. 3liters By Nasal Cannula 12)  Nitrofurantoin Monohyd Macro 100 Mg Caps (Nitrofurantoin Monohyd Macro) .... Take 1 Capsule By Mouth Once A Day 13)  Iron 325 (65 Fe) Mg Tabs (Ferrous Sulfate) .Marland Kitchen.. 1 By Mouth Daily 14)  Septra Ds 800-160 Mg Tabs  (Sulfamethoxazole-Trimethoprim) .Marland Kitchen.. 1 By Mouth Two Times A Day For 10 Days For Sinus Infection  Allergies (verified): 1)  ! Pcn  Past History:  Past Medical History: Reviewed history from 01/12/2010 and no changes required. Diverticular Disease Peptic Ulcer Disease GERD Hiatal hernia Dyslipidemia HTN Urinary tract infections Hematuria Hyperglycemia Urinary incontinence Severe COPD with emphysema      - PFT 01/27/07 FEV1 1.22(57%), FEV1% 31, TLC 6.18 (127%), DLCO 60%, no BD Hypoxemia      - uses 2 liters oxygen at rest and sleep, 3 liters with exertion Lung cancer 1987 Spinal abscess 2000 secondary to periodontal disease Osteoarthritis Depression Anxiety  Past Surgical History: Reviewed history from 10/22/2009 and no changes required. BTL Bladder Surgery L Breast Biopsy 08/09/97 Ba. Swallow, H. H. negative reflux 03/99 Hosp Klebsiella Pneumonia Sepsis due to Gum Surgery 2000 Spinal Abscess removed due to Sciatica 09/1998 MRI L/S, severe degen change L5/S1, 06/19/99 CT abd. wnl, 10/08/98 BE, diverticulia 10/06/98 CT, L/S area, Disc dz L5/S1, R-S1 nerve root positive 09/30/98 Cardiolite wnl EF 74% 07/12/01 TAH BSO, vag repair secondary prolapse 07/18/01 Colonoscopy, polyps, F/U 3 years, Bx. negative 08/28/02 EGD, GERD stricture H. H. 09/10/02 DEXA, [facility], wnl 01/17/03 Abd/Pelvic CT 2 sm cystic lesions in liver Uterus surg absent Divertics in Sigmoid 01/2007 Cystoscopy  nml 02/11/2007 CT Chest 1.9x0.8cm anterior pleural based lesion (Dr Craige Cotta) 02/06/2007 ECHO NML LVF Mild ASclerosis, Mild Pulm  Htn CT ABD/ Pelvis Diverticulosis Mod Atherosclerotic Dz ([facility], Dr Achilles Dunk) 09/04/2008 Cystoscopy Nml (Dr Achilles Dunk) 5/5 2010 Choley 02/10/2009 Colonoscopy Polyps x 3  Divertics  Stenosis of Sigmoid (Dr Marina Goodell) 10/07/2009 CT Abd  No Sigmoid Mass DDD L5/S1  10/09/2009 HOSP RKR Postop wheezing and SOB 5/25-5/31/2011 LE Dopplers and Chest CT  No PE 10/15/2009  Vital Signs:  Patient  profile:   70 year old female Height:      64 inches (162.56 cm) Weight:      175 pounds (79.55 kg) BMI:     30.15 O2 Sat:      98 % on 3 L/min pulsed Temp:     98.3 degrees F (36.83 degrees C) oral Pulse rate:   73 / minute BP sitting:   132 / 62  (left arm) Cuff size:   large  Vitals Entered By: Michel Bickers CMA (April 30, 2010 10:42 AM)  O2 Sat at Rest %:  98 O2 Flow:  3 L/min pulsed CC: COPD...pt c/o incr SOB w/ exertion...on Spetra for sinus infection;saw ENT Dr. Willeen Cass Comments Medications reviewed with patient Michel Bickers [facility]  April 30, 2010 10:42 AM   Physical Exam  General:  normal appearance, healthy appearing, and obese.   Nose:  no sinus tenderness, clear drainage Mouth:  MP 3 Neck:  no JVD.   Lungs:  diminished breath sounds, no wheezing or rales Heart:  regular rhythm, normal rate, and no murmurs.   Extremities:  no edema Neurologic:  normal CN II-XII and strength normal.   Cervical Nodes:  no significant adenopathy Psych:  alert and cooperative; normal mood and affect; normal attention span and concentration   Impression & Recommendations:  Problem # 1:  COPD (ICD-496) She is to continue on her current inhaler regimen.  I have completed a handicap parking form for her.  Problem # 2:  HYPOXEMIA (ICD-799.02)  She will need to use 2 liters at rest, 3liters with exertion, and 2 liters at night.  Problem # 3:  ALLERGIC RHINITIS (ICD-477.9)  She is being treated with septra by ENT for sinusitus.  She is also using nasonex and nasal irrigation.  Problem # 4:  HEMATURIA, HX OF (ICD-V13.09)  She is using nitrofurantoin for chronic bladder infections.  Explained to her that this class of antibiotics has been know to cause lung problems.  Advised her to d/w her urologist to determine if an alternative antibiotic is available.  Medications Added to Medication List This Visit: 1)  Spiriva Handihaler 18 Mcg Caps (Tiotropium bromide monohydrate) .... One puff  every other day 2)  Iron 325 (65 Fe) Mg Tabs (Ferrous sulfate) .Marland Kitchen.. 1 by mouth daily 3)  Septra Ds 800-160 Mg Tabs (Sulfamethoxazole-trimethoprim) .Marland Kitchen.. 1 by mouth two times a day for 10 days for sinus infection 4)  Nasonex 50 Mcg/act Susp (Mometasone furoate) .... Two sprays once daily as needed  Complete Medication List: 1)  Symbicort 160-4.5 Mcg/act Aero (Budesonide-formoterol fumarate) .... Two puffs twice daily 2)  Spiriva Handihaler 18 Mcg Caps (Tiotropium bromide monohydrate) .... One puff every other day 3)  Proair Hfa 108 (90 Base) Mcg/act Aers (Albuterol sulfate) .... Two puffs up to four times per day as needed 4)  Cozaar 50 Mg Tabs (Losartan potassium) .Marland Kitchen.. 1 daily by mouth 5)  Metoprolol Tartrate 25 Mg Tabs (Metoprolol tartrate) .Marland Kitchen.. 1 twice a day by mouth 6)  Pravachol 80 Mg Tabs (Pravastatin sodium) .... One ta b by mouth at night. 7)  Omeprazole  40 Mg Cpdr (Omeprazole) .... Take one by mouth daily 8)  Zoloft 100 Mg Tabs (Sertraline hcl) .Marland Kitchen.. 1 by mouth daily 9)  Wellbutrin Sr 150 Mg Xr12h-tab (Bupropion hcl) .Marland Kitchen.. 1 tablet  twice a day by mouth 10)  Guaifenesin 400 Mg Tabs (Guaifenesin) .... Take 1 and 1/2 by mouth two times a day as needed 11)  Oxygen  .Marland Kitchen.. 3liters by nasal cannula 12)  Nitrofurantoin Monohyd Macro 100 Mg Caps (Nitrofurantoin monohyd macro) .... Take 1 capsule by mouth once a day 13)  Iron 325 (65 Fe) Mg Tabs (Ferrous sulfate) .Marland Kitchen.. 1 by mouth daily 14)  Septra Ds 800-160 Mg Tabs (Sulfamethoxazole-trimethoprim) .Marland Kitchen.. 1 by mouth two times a day for 10 days for sinus infection 15)  Nasonex 50 Mcg/act Susp (Mometasone furoate) .... Two sprays once daily as needed  Other Orders: Est. Patient Level III (49449)  Patient Instructions: 1)  Follow up in 4 to 6 months

## 2010-06-23 NOTE — Letter (Signed)
Summary: Results Follow up Letter  [facility]  [address] [place], [place] [address]   Phone: [phone]  Fax: [phone]    10/13/2009 MRN: [id number]  [facility] [address] [place], [place]  [address]  Dear Ms. Tuazon,  The following are the results of your recent test(s):  Test         Result    Pap Smear:        Normal _X____  Not Normal _____ Comments: Please repeat in one year. ______________________________________________________ Cholesterol: LDL(Bad cholesterol):         Your goal is less than:         HDL (Good cholesterol):       Your goal is more than: Comments:  ______________________________________________________ Mammogram:        Normal _____  Not Normal _____ Comments:  ___________________________________________________________________ Hemoccult:        Normal _____  Not normal _______ Comments:    _____________________________________________________________________ Other Tests:    We routinely do not discuss normal results over the telephone.  If you desire a copy of the results, or you have any questions about this information we can discuss them at your next office visit.   Sincerely,     Laurita Quint, MD

## 2010-06-23 NOTE — Letter (Signed)
Summary: Dr.Brian Cope,[facility],Note  Dr.Brian Cope,[facility],Note   Imported By: Beau Fanny 06/09/2009 15:51:51  _____________________________________________________________________  External Attachment:    Type:   Image     Comment:   External Document

## 2010-06-23 NOTE — Letter (Signed)
Summary: Patient Notice- Polyp Results  [facility]  [address] [place], [place] [address]   Phone: [phone]  Fax: [phone]        Oct 10, 2009 MRN: [id number]    [facility] [address] [place], [place]  [address]    Dear Ms. Bartz,  I am pleased to inform you that the colon polyp(s) removed during your recent colonoscopy was (were) found to be benign (no cancer detected) upon pathologic examination.  I recommend you have a repeat colonoscopy examination in 3 years to look for recurrent polyps, as having colon polyps increases your risk for having recurrent polyps or even colon cancer in the future.  Should you develop new or worsening symptoms of abdominal pain, bowel habit changes or bleeding from the rectum or bowels, please schedule an evaluation with either your primary care physician or with me.  Additional information/recommendations:    __ If not yet done, Please call [phone] to schedule a return visit to review your situation.    __ Continue treatment plan as outlined the day of your exam.    Please call us if you are having persistent problems or have questions about your condition that have not been fully answered at this time.  Sincerely,  [name] MD  This letter has been electronically signed by your physician.  Appended Document: Patient Notice- Polyp Results letter mailed

## 2010-06-23 NOTE — Procedures (Signed)
Summary: [facility]  [facility]   Imported By: Sherian Rein 12/08/2009 12:03:14  _____________________________________________________________________  External Attachment:    Type:   Image     Comment:   External Document

## 2010-06-25 NOTE — Letter (Signed)
Summary: [facility]  [facility]   Imported By: Maryln Gottron 06/16/2010 11:11:59  _____________________________________________________________________  External Attachment:    Type:   Image     Comment:   External Document

## 2010-06-25 NOTE — Miscellaneous (Signed)
Summary: [facility]  [facility]   Imported By: Lester Stigler 05/21/2010 09:50:30  _____________________________________________________________________  External Attachment:    Type:   Image     Comment:   External Document

## 2010-07-04 ENCOUNTER — Encounter: Payer: Self-pay | Admitting: Family Medicine

## 2010-08-19 ENCOUNTER — Other Ambulatory Visit: Payer: Self-pay

## 2010-08-21 ENCOUNTER — Other Ambulatory Visit (INDEPENDENT_AMBULATORY_CARE_PROVIDER_SITE_OTHER): Payer: Medicare Other | Admitting: Family Medicine

## 2010-08-21 DIAGNOSIS — E7800 Pure hypercholesterolemia, unspecified: Secondary | ICD-10-CM

## 2010-08-21 DIAGNOSIS — I10 Essential (primary) hypertension: Secondary | ICD-10-CM

## 2010-08-21 LAB — HEPATIC FUNCTION PANEL
ALT: 21 U/L (ref 0–35)
Alkaline Phosphatase: 40 U/L (ref 39–117)
Bilirubin, Direct: 0.1 mg/dL (ref 0.0–0.3)
Total Protein: 6.4 g/dL (ref 6.0–8.3)

## 2010-08-21 LAB — CBC WITH DIFFERENTIAL/PLATELET
Eosinophils Relative: 3.1 % (ref 0.0–5.0)
Monocytes Absolute: 0.5 10*3/uL (ref 0.1–1.0)
Monocytes Relative: 8.3 % (ref 3.0–12.0)
Neutrophils Relative %: 54 % (ref 43.0–77.0)
Platelets: 173 10*3/uL (ref 150.0–400.0)
WBC: 6.3 10*3/uL (ref 4.5–10.5)

## 2010-08-21 LAB — LDL CHOLESTEROL, DIRECT: Direct LDL: 134.9 mg/dL

## 2010-08-21 LAB — BASIC METABOLIC PANEL
BUN: 15 mg/dL (ref 6–23)
Creatinine, Ser: 0.9 mg/dL (ref 0.4–1.2)
GFR: 69.45 mL/min (ref >60.00)

## 2010-08-21 LAB — TSH: TSH: 2.62 u[IU]/mL (ref 0.35–5.50)

## 2010-08-26 ENCOUNTER — Encounter: Payer: Self-pay | Admitting: Family Medicine

## 2010-08-26 ENCOUNTER — Ambulatory Visit (INDEPENDENT_AMBULATORY_CARE_PROVIDER_SITE_OTHER): Payer: Medicare Other | Admitting: Family Medicine

## 2010-08-26 DIAGNOSIS — R7989 Other specified abnormal findings of blood chemistry: Secondary | ICD-10-CM

## 2010-08-26 DIAGNOSIS — R059 Cough, unspecified: Secondary | ICD-10-CM

## 2010-08-26 DIAGNOSIS — J4489 Other specified chronic obstructive pulmonary disease: Secondary | ICD-10-CM

## 2010-08-26 DIAGNOSIS — I10 Essential (primary) hypertension: Secondary | ICD-10-CM

## 2010-08-26 DIAGNOSIS — F411 Generalized anxiety disorder: Secondary | ICD-10-CM

## 2010-08-26 DIAGNOSIS — F3289 Other specified depressive episodes: Secondary | ICD-10-CM

## 2010-08-26 DIAGNOSIS — I498 Other specified cardiac arrhythmias: Secondary | ICD-10-CM

## 2010-08-26 DIAGNOSIS — F329 Major depressive disorder, single episode, unspecified: Secondary | ICD-10-CM

## 2010-08-26 DIAGNOSIS — Z0000 Encounter for general adult medical examination without abnormal findings: Secondary | ICD-10-CM

## 2010-08-26 DIAGNOSIS — R5381 Other malaise: Secondary | ICD-10-CM

## 2010-08-26 DIAGNOSIS — R05 Cough: Secondary | ICD-10-CM

## 2010-08-26 DIAGNOSIS — J449 Chronic obstructive pulmonary disease, unspecified: Secondary | ICD-10-CM

## 2010-08-26 DIAGNOSIS — R5383 Other fatigue: Secondary | ICD-10-CM

## 2010-08-26 NOTE — Assessment & Plan Note (Signed)
Rate well controlled today. Cont curr meds.

## 2010-08-26 NOTE — Assessment & Plan Note (Signed)
Reasonably resolved.

## 2010-08-26 NOTE — Assessment & Plan Note (Signed)
Good nos, euglycemic today.

## 2010-08-26 NOTE — Assessment & Plan Note (Signed)
Well controlled on curr meds.

## 2010-08-26 NOTE — Progress Notes (Signed)
Subjective:    [name], female    DOB: January 07, 1941, 70 y.o.   MRN: [id number]  HPI Pt here for Comp Exam. She is now on chronic O2 and does well with it. Otherwise she is fine. She is  overweight and knows it.    Review of Systems  Constitutional: Negative for fever, chills, diaphoresis, fatigue and unexpected weight change.       Typically has sweats and weakness. Her knee surgery last yea kept her from doing much.  HENT: Negative for hearing loss, ear pain, rhinorrhea, trouble swallowing and tinnitus.   Eyes: Negative for pain, discharge and visual disturbance.  Respiratory: Negative for cough, shortness of breath and wheezing.   Cardiovascular: Positive for chest pain (has occas). Negative for palpitations and leg swelling.       No Fainting or Fatigue. Some regular SOB with exertion.  Gastrointestinal: Negative for nausea, vomiting, abdominal pain, diarrhea, constipation and blood in stool.       No Heartburn unless doesn't take "my pills"  Genitourinary: Positive for flank pain and vaginal bleeding. Negative for dysuria and frequency.  Musculoskeletal: Negative for myalgias and arthralgias. Back pain: arthritis.  Skin: Negative for rash.       No Itching or Dryness.  Neurological: Negative for tremors and numbness.       No Tingling. No Balance Problems.  Hematological: Negative for adenopathy. Bruises/bleeds easily.  Psychiatric/Behavioral: Negative for dysphoric mood and agitation.       Objective:   Physical Exam  Constitutional: She is oriented to person, place, and time. She appears well-developed and well-nourished. No distress.  HENT:  Head: Normocephalic and atraumatic.  Right Ear: External ear normal.  Left Ear: External ear normal.  Nose: Nose normal.  Mouth/Throat: Oropharynx is clear and moist.  Eyes: Conjunctivae and EOM are normal. Pupils are equal, round, and reactive to light. No scleral icterus.  Neck: Normal range of motion. Neck  supple. No thyromegaly present.  Cardiovascular: Normal rate, regular rhythm, normal heart sounds and intact distal pulses.   No murmur heard. Pulmonary/Chest: Effort normal and breath sounds normal. No respiratory distress. She has no wheezes.  Abdominal: Soft. Bowel sounds are normal. She exhibits no mass. There is no tenderness.  Genitourinary:       Not done   Musculoskeletal: Normal range of motion.       Back Nontender in the Midline  Lymphadenopathy:    She has no cervical adenopathy.  Neurological: She is alert and oriented to person, place, and time. She exhibits normal muscle tone. Coordination normal.  Skin: Skin is warm and dry. No rash noted. She is not diaphoretic.  Psychiatric: She has a normal mood and affect. Her behavior is normal. Judgment and thought content normal.   Allergies  Allergen Reactions  . Penicillins     REACTION: hives    Current Outpatient Prescriptions on File Prior to Visit  Medication Sig Dispense Refill  . albuterol (PROAIR HFA) 108 (90 BASE) MCG/ACT inhaler Inhale 2 puffs into the lungs every 4 (four) hours as needed.        . budesonide-formoterol (SYMBICORT) 160-4.5 MCG/ACT inhaler Inhale 2 puffs into the lungs 2 (two) times daily.        Marland Kitchen buPROPion (WELLBUTRIN SR) 150 MG 12 hr tablet Take 150 mg by mouth 2 (two) times daily.        Marland Kitchen losartan (COZAAR) 50 MG tablet Take 50 mg by mouth daily.        Marland Kitchen  metoprolol (LOPRESSOR) 25 MG tablet Take 25 mg by mouth 2 (two) times daily.        . mometasone (NASONEX) 50 MCG/ACT nasal spray 2 sprays by Nasal route daily as needed.        Marland Kitchen omeprazole (PRILOSEC) 40 MG capsule Take 40 mg by mouth daily.        . pravastatin (PRAVACHOL) 80 MG tablet Take 80 mg by mouth at bedtime.        . sertraline (ZOLOFT) 100 MG tablet Take 100 mg by mouth daily.        Marland Kitchen tiotropium (SPIRIVA) 18 MCG inhalation capsule Place 18 mcg into inhaler and inhale daily.        . ferrous sulfate 325 (65 FE) MG tablet Take 325 mg  by mouth daily.        Marland Kitchen guaifenesin (HUMIBID E) 400 MG TABS Take by mouth. Take 1 and 1/2 two times a day as needed      . nitrofurantoin, macrocrystal-monohydrate, (MACROBID) 100 MG capsule Take 100 mg by mouth daily.        Docia Barrier IN Inhale into the lungs. 3 Liters by nasal cannula         Past Medical History  Diagnosis Date  . Diverticular disease 10/09/09    CT abd no sigmoid mass DDD L5/S1  . Peptic ulcer   . GERD (gastroesophageal reflux disease)   . Hiatal hernia   . Hypertension   . Dyslipidemia   . Urinary tract infection   . Hematuria   . COPD, severe 01/27/07    PFT, FEV 1.22 (57%), FEV 1% 31, TLC 6.18 (127%), DLCO 60%, no BD  . Lung cancer 1987  . Spinal abscess 2000    secondary to periodontal disease  . Osteoarthritis   . Depression with anxiety     History   Social History  . Marital Status: Married    Spouse Name: N/A    Number of Children: 2  . Years of Education: N/A   Occupational History  . Retired     prior Print production planner at [employer]   Social History Main Topics  . Smoking status: Former Smoker    Quit date: 05/29/1995  . Smokeless tobacco: Not on file  . Alcohol Use: Yes     wine daily  . Drug Use: No  . Sexually Active: Not on file   Other Topics Concern  . Not on file   Social History Narrative   Remarried for the third time, 2nd husband died of heart attack. Two stepsons    Family History  Problem Relation Age of Onset  . Pancreatitis Mother   . Heart disease Father   . Hypertension Father   . Emphysema Father   . Arthritis Brother   . Hypertension Brother            Assessment & Plan:  Comp Exam  I have personally reviewed the Medicare Annual Wellness questionnaire and have noted 1. The patient's medical and social history 2. Their use of alcohol, tobacco or illicit drugs 3. Their current medications and supplements 4. The patient's functional ability including ADL's, fall risks, home safety  risks and hearing or visual             impairment. 5. Diet and physical activities 6. Evidence for depression or mood disorders

## 2010-08-26 NOTE — Assessment & Plan Note (Signed)
Stable but hasn't done much physical since her knee replacement a year ago....really needs to exercise as able.

## 2010-08-26 NOTE — Assessment & Plan Note (Signed)
Well controlled. Cont curr meds. 

## 2010-08-26 NOTE — Assessment & Plan Note (Signed)
Controlled on curr meds and O2. Cont. Discussed O2 levels and inhaler use.Marland Kitchen

## 2010-08-26 NOTE — Patient Instructions (Addendum)
Refer for mammo RTC 6 mos.

## 2010-08-26 NOTE — Assessment & Plan Note (Signed)
No evidence of same today. Cont meds.

## 2010-10-06 NOTE — Assessment & Plan Note (Signed)
[facility]                             PULMONARY OFFICE NOTE   NAME:[name] [name], [name]                    MRN:          [id number]  DATE:01/31/2007                            DOB:          1940/09/28    I called the patient with the results of her chest x-ray from January 27, 2007, and this showed a faint nodular opacity at the right base  which did not correlate with her nipple marker.  Due to the uncertain  nature of this lesion, I have recommended that she undergo a CT scan of  her chest.  I will therefore arrange for this within the next one week  and I will call her with the results to let her know if any further  interventions would be necessary.     [name], MD  Electronically Signed    VS/MedQ  DD: 01/31/2007  DT: 02/01/2007  Job #: [id number]

## 2010-10-06 NOTE — Assessment & Plan Note (Signed)
HEALTHCARE                             PULMONARY OFFICE NOTE   NAME:[name] [name], [name] [name]                    MRN:          [id number]  DATE:12/09/2006                            DOB:          06/03/1940    I met Ms. Tatar today for evaluation of her dyspnea.  She carries a  diagnosis of asthma-exercise induced, bronchitis, and emphysema.  She  apparently had undergone pulmonary function tests and chest x-ray at  [facility] in 2003 prior to undergoing bladder surgery.  She has had a  history of smoking of 90 pack years and quit in 1997.  She had also been  diagnosed previously with lung cancer with a right upper lobe nodule and  had right upper lobectomy in 1987.  She is currently using Asmanex 1  puff a day and Spiriva 1 puff daily.  She is not on any rescue  medicines.  She says that she gets shortness of breath after exercising  on her treadmill for about 5 minutes at a speed of 2-1/2 miles per hour,  she also feels like she gets tightness in her chest and can not get the  air in.  She is not having much as far as coughing or sputum production  and she does have an occasional wheeze.  She complains of having nasal  congestion with postnasal drip.  She does not have any problems with  seasonal allergies.  She has not had any recent sick exposures, there is  no significant travel history.  She does not have any exposures to farm  animals or birds.  There is no significant occupational exposures.  She  denies any recent episodes of fevers, chills, or sweats.  She also  denies any significant leg swelling.  She has gained approximately 10  pounds over the last 6 years.  She does complain of dryness in her mouth  as well as occasional sore throat but denies hoarseness.   PAST MEDICAL HISTORY:  1. Significant for diverticular disease.  2. Peptic ulcer disease.  3. Hiatal hernia.  4. Elevated cholesterol.  5. Hypertension.  6. Bladder dysfunction.  7. Depression.  8. She has had a hysterectomy and bilateral salpingo-oophorectomy.  9. She had bladder repair.  10.She had a normal Cardiolite stress test in February 2003 with      ejection fraction of 74%.  11.She had fibrocystic changes on her left breast biopsy.  12.She has L5-S1 disk disease.  13.She had an episode of Klebsiella pneumonia after gum disease in      2000 which was complicated by a spinal abscess.  14.She has had previous lung cancer, status post right upper lobectomy      in 1987.   CURRENT MEDICATIONS:  1. Asmanex 1 puff daily.  2. Lipitor 10 mg 3 times a week.  3. Spiriva 1 puff daily.  4. Cozaar 100 mg daily.  5. Sertraline 150 mg daily.  6. Klor-Con 20 mEq daily.  7. Triamcinolone and hydrochlorothiazide 37.5/25 mg daily.  8. Furosemide 40 mg daily.  9. Wellbutrin 150 mg daily.   SHE HAS AN  ALLERGY TO PENICILLIN WHICH CAUSES HER TO DEVELOP HIVES.   SOCIAL HISTORY:  She quit smoking in 1997 and had a 90 pack year history  of smoking.  She is married.  She drinks 2-3 glasses of wine on a daily  basis.  She is retired.   FAMILY HISTORY:  Significant for her father who had emphysema.   REVIEW OF SYSTEMS:  She says that she has tried Atrovent, Flovent, and  Advair before but did not notice as much benefit from these.  She does  also complain of difficulty with her sleep as well as snoring.  She  tends to breath through her mouth at night and has trouble sleeping on  her back.  She is also complaining of feeling tired during the day.   PHYSICAL EXAMINATION:  Temperature is 98.2, blood pressure is 120/66,  heart rate is 100, oxygen saturation is 90% on room air.  HEENT:  Pupils reactive, extraocular muscles intact, there was no sinus  tenderness, she has a clear nasal discharge, there is mild erythema of  the posterior pharynx, she has a Mallampati II airway with a decrease AP  diameter of the oropharynx.  There was no lymphadenopathy, no  thyromegaly.   HEART:  With S1 and S2.  CHEST:  With decreased breath sounds, prolonged expiratory phase but no  wheezing or rales.  ABDOMEN:  Obese, soft, nontender, positive bowel sounds.  EXTREMITIES:  No edema, cyanosis, clubbing.  NEUROLOGIC:  No focal deficits were appreciated.   IMPRESSION:  Dyspnea with a history of chronic obstructive pulmonary  disease and emphysema as well as asthma and exercise induced asthma.  What I would like for her to do is undergo repeat pulmonary function  tests as well as a chest x-ray.  I will try to obtain copies of her  previous test results from [facility] in 2003.  I will also start her  on Symbicort 160/4.5 two puffs b.i.d. and have her discontinue her  Asmanex.  I will continue her on Spiriva 1 puff daily.  I will also  start her on ProAir HFA 2 puffs q.i.d. p.r.n.  I have advised her to try  using this prior to her exercise regimen as well to see if she gains  symptomatic benefit from this.  She does also have sinus disease and I  have instructed her on the use of nasal irrigation as well as starting  her on Veramyst 2 sprays in each nostril once daily.  I will then follow  up with her in approximately one month and reassess her symptom status  at that time.  If she is still having difficulty then it may be  beneficial to look into whether she has any underlying cardiac disease  which could be contributing to her symptoms and for this I would have  her undergo an echocardiogram but I do not think she needs this at this  time.  Additionally, she does have some symptoms which would be  suggestive of sleep disorder breathing.  Of note also with this is that  she is on several medicines for her blood pressure which would raise a  concern for sleep  disorder breathing even more.  I will have further discussions about  whether she needs to have a sleep study done at her next followup.     [name], MD  Electronically Signed    VS/MedQ  DD: 12/09/2006   DT: 12/10/2006  Job #: [id number]   cc:  Modesto Charon, MD

## 2010-10-06 NOTE — Assessment & Plan Note (Signed)
[facility]                             PULMONARY OFFICE NOTE   NAME:[name] [name], [name] [name]                    MRN:          [id number]  DATE:02/23/2007                            DOB:          03/23/1941    PULMONARY FOLLOWUP VISIT   I saw [name] [name] today in followup for her severe COPD, abnormal  chest x-ray, and sleep disruption.   She had undergone a CT scan of her chest with contrast at [facility] on February 06, 2007, and this showed a 1.9x0.8  cm anterolateral pleural based nodular opacity at the level of the  diaphragm on the right, adjacent to the anterior right seventh rib.  There is no other obvious lesion on her CT scan.  There is no  significant lymphadenopathy.  She says her breathing symptoms are  reasonably stable.   PHYSICAL EXAM:  She weighs 162 pounds, temperature 98.7, blood pressure  140/82, heart rate is 90, oxygen saturation is 93% on room air.  HEENT:  There is no sinus tenderness.  No oral lesions.  No  lymphadenopathy.  HEART:  S1, S2.  CHEST:  No wheezing.  ABDOMEN:  Soft and nontender.  EXTREMITIES:  No edema.   CURRENT MEDICATIONS:  1. Lipitor 10 mg 3 times a week.  2. Spiriva 1 puff daily.  3. Cozaar 100 mg daily.  4. Sertraline 150 mg daily.  5. Klor-Con 20 mEq daily.  6. Triamterine/hydrochlorothiazide 37.5/25 once daily.  7. Furosemide 40 mg daily.  8. Wellbutrin 150 mg daily.  9. Symbicort 160/4.5 two puffs b.i.d.  10.Imipramine once daily.  11.ProAir HFA p.r.n.   IMPRESSION:  1. Right lung pleural based mass lesion.  I had reviewed the results      with [name] [name] and her husband.  I discussed various options      with regard to further evaluation of this, including PET scan      versus radiographic observation.  They have opted for radiographic      observation.  I will, therefore, arrange for her to undergo a CT      scan of the chest without contrast in about 3 to 4  months, and I      will follow up with her after I have a chance to review this to      determine if any further interventions would be necessary.  2. Severe chronic obstructive pulmonary disease.  She is stable on her      current inhaler regimen.  3. Sleep disruption.  She seems to be reasonably stable with this at      the present time.   I will follow up with her after I have a chance to review her CT scan of  the chest.     [name], MD  Electronically Signed    VS/MedQ  DD: 02/23/2007  DT: 02/23/2007  Job #: [id number]   cc:   Arta Silence, MD

## 2010-10-06 NOTE — Assessment & Plan Note (Signed)
[facility]                             PULMONARY OFFICE NOTE   NAME:[name] [name] CASTELLO                    MRN:          [id number]  DATE:01/27/2007                            DOB:          1940-12-18    I saw [name] [name] today in followup for her dyspnea.  She says that  her symptoms are reasonably stable and that she is not having much as  far as cough, sputum production, wheezing, or chest congestion.   CURRENT MEDICATIONS:  1. Lipitor 10 mg 3 times a week.  2. Spiriva 1 puff daily.  3. Cozaar 100 mg daily.  4. Sertraline 150 mg daily.  5. Klor-Con 20 mEq daily.  6. Triamterene/hydrochlorothiazide combination 37.5/25 mg daily.  7. Furosemide 40 mg daily.  8. Wellbutrin 150 mg daily.  9. Symbicort 160/4.5 mg two puffs b.i.d.  10.ProAir HFA which she says she has not really had the need to use.   PHYSICAL EXAMINATION:  VITAL SIGNS:  She is 165 pounds, temperature is  97.9, blood pressure 130/80, heart rate is 100, oxygen saturation is 94%  on room air.  HEENT:  There is no sinus tenderness, no nasal discharge, no oral  lesions.  HEART:  S1 S2.  CHEST:  Prolonged expiratory phase but no wheezing or rales.  ABDOMEN:  Obese, soft, nontender.  EXTREMITIES:  No edema.   Pulmonary function tests in my office today showed a post bronchodilator  FEV1/FVC ratio of 31%.  FEV1 was 1.22 liters which is 57% of predicted.  Her FVC was 3.89 liters which is 132% of predicted.  There is no  significant bronchodilator response.  Total lung capacity was 6.18  liters which was 127% of predicted.  Residual volume was 2.56 liters  which was 136% of predicted.  Her diffusing capacity was 60% of  predicted.  Her pulmonary function tests would be consistent with severe  COPD, hyperinflation with air trapping, and moderate diffusing defect  consistent with emphysema.   IMPRESSION:  1. Dyspnea with severe chronic obstructive pulmonary disease on her  pulmonary function test.  She is doing reasonably well on her      current inhaler regimen of Spiriva and Symbicort and I will      continue her on this.  2. Sleep disruption.  I have asked her husband to monitor her sleep      pattern to determine if she has any further symptoms suggestive of      sleep apnea and if this is the case, then we may need to pursue      overnight polysomnogram.  3. Her last chest x-ray from July, showed a possible nodule on the      right lower lung field which could have also been a nipple shadow.      I will therefore have her undergo a repeat chest x-ray with a      nipple marker.  I will call her with the results of the x-ray.  If      this does turn out to be an abnormality, then we may need to  schedule her for a CT scan of her chest.   I will call her with the results of her x-ray, otherwise I plan on  following up with her in about 4 months.     [name] Helling, MD  Electronically Signed    VS/MedQ  DD: 01/27/2007  DT: 01/27/2007  Job #: [id number]   cc:   Arta Silence, MD

## 2010-11-11 ENCOUNTER — Other Ambulatory Visit: Payer: Self-pay | Admitting: *Deleted

## 2010-11-11 MED ORDER — BUPROPION HCL ER (SR) 150 MG PO TB12: 150.0000 mg | ORAL_TABLET | Freq: Two times a day (BID) | ORAL | Status: DC

## 2010-12-11 ENCOUNTER — Other Ambulatory Visit: Payer: Self-pay | Admitting: *Deleted

## 2010-12-11 MED ORDER — PRAVASTATIN SODIUM 80 MG PO TABS: 80.0000 mg | ORAL_TABLET | Freq: Every day | ORAL | Status: DC

## 2010-12-11 MED ORDER — METOPROLOL TARTRATE 25 MG PO TABS: 25.0000 mg | ORAL_TABLET | Freq: Two times a day (BID) | ORAL | Status: DC

## 2010-12-17 ENCOUNTER — Ambulatory Visit: Payer: Self-pay | Admitting: Family Medicine

## 2010-12-22 ENCOUNTER — Encounter: Payer: Self-pay | Admitting: Family Medicine

## 2010-12-24 ENCOUNTER — Encounter: Payer: Self-pay | Admitting: *Deleted

## 2011-02-17 ENCOUNTER — Other Ambulatory Visit: Payer: Self-pay | Admitting: *Deleted

## 2011-02-17 MED ORDER — TIOTROPIUM BROMIDE MONOHYDRATE 18 MCG IN CAPS: 18.0000 ug | ORAL_CAPSULE | Freq: Every day | RESPIRATORY_TRACT | Status: DC

## 2011-02-25 ENCOUNTER — Encounter: Payer: Self-pay | Admitting: Family Medicine

## 2011-02-25 ENCOUNTER — Ambulatory Visit (INDEPENDENT_AMBULATORY_CARE_PROVIDER_SITE_OTHER): Payer: Medicare Other | Admitting: Family Medicine

## 2011-02-25 DIAGNOSIS — R7989 Other specified abnormal findings of blood chemistry: Secondary | ICD-10-CM

## 2011-02-25 DIAGNOSIS — I498 Other specified cardiac arrhythmias: Secondary | ICD-10-CM

## 2011-02-25 DIAGNOSIS — F411 Generalized anxiety disorder: Secondary | ICD-10-CM

## 2011-02-25 DIAGNOSIS — J4489 Other specified chronic obstructive pulmonary disease: Secondary | ICD-10-CM

## 2011-02-25 DIAGNOSIS — I10 Essential (primary) hypertension: Secondary | ICD-10-CM

## 2011-02-25 DIAGNOSIS — J449 Chronic obstructive pulmonary disease, unspecified: Secondary | ICD-10-CM

## 2011-02-25 NOTE — Assessment & Plan Note (Signed)
Stable on chronic home O2. Pulse Ox 96%. Comfortable. Given samps of Symbicort.

## 2011-02-25 NOTE — Assessment & Plan Note (Signed)
Stable

## 2011-02-25 NOTE — Progress Notes (Signed)
  Subjective:    [name], female    DOB: [birth year], 70 y.o.   MRN: [id number]  HPI Pt here with her husband for 6 month follow up. She has ongoing COPD on chronic O2 and seems resigned to this. She has times of SOB but generally deals with this well. She has anxiety that is reasonably well controlled but worsens in times of SOB. We discussed this at length today. She has no S/S of depression and feels good about her present situation. She has not had any tachycardia since being on the Metoprolol. Fatigue has lately been reasonably controlled, she is being very careful with her sweet and carb intake and she has seen no blood in her urine lately.    Review of Systems  Constitutional: Negative for fever, chills, diaphoresis, activity change and fatigue.  HENT: Negative for ear pain, congestion, rhinorrhea and postnasal drip.   Eyes: Negative for redness.  Respiratory: Negative for cough, chest tightness, shortness of breath and wheezing.        Typically stable COPD sxs.  Cardiovascular: Negative for chest pain.       Objective:   Physical Exam  Constitutional: She appears well-developed and well-nourished. No distress.  HENT:  Head: Normocephalic and atraumatic.  Right Ear: External ear normal.  Left Ear: External ear normal.  Nose: Nose normal.  Mouth/Throat: Oropharynx is clear and moist. No oropharyngeal exudate.  Eyes: Conjunctivae and EOM are normal. Pupils are equal, round, and reactive to light.  Neck: Normal range of motion. Neck supple. No thyromegaly present.  Cardiovascular: Normal rate, regular rhythm and normal heart sounds.   Pulmonary/Chest: Effort normal and breath sounds normal. She has no wheezes. She has no rales.  Lymphadenopathy:    She has no cervical adenopathy.  Skin: She is not diaphoretic.          Assessment & Plan:

## 2011-02-25 NOTE — Assessment & Plan Note (Signed)
Good control. Cont curr meds. BP Readings from Last 3 Encounters:  02/25/11 116/66  08/26/10 116/64  04/30/10 132/62

## 2011-02-25 NOTE — Assessment & Plan Note (Signed)
Being careful with diet intake. Discussed again.

## 2011-02-25 NOTE — Patient Instructions (Signed)
Call early Jan for appt with Dr Reece Agar. Speak with pharmacy about Zostavax and have them request script. Can get Flu shot then.

## 2011-02-25 NOTE — Assessment & Plan Note (Signed)
Good control. Pulse 64 today.

## 2011-03-30 ENCOUNTER — Ambulatory Visit (INDEPENDENT_AMBULATORY_CARE_PROVIDER_SITE_OTHER): Payer: Medicare Other | Admitting: Family Medicine

## 2011-03-30 ENCOUNTER — Telehealth: Payer: Self-pay | Admitting: Radiology

## 2011-03-30 ENCOUNTER — Encounter: Payer: Self-pay | Admitting: Family Medicine

## 2011-03-30 ENCOUNTER — Ambulatory Visit (INDEPENDENT_AMBULATORY_CARE_PROVIDER_SITE_OTHER)
Admission: RE | Admit: 2011-03-30 | Discharge: 2011-03-30 | Disposition: A | Discharge status: Home / Self Care | Payer: Medicare Other | Source: Ambulatory Visit | Attending: Family Medicine | Admitting: Family Medicine

## 2011-03-30 VITALS — BP 120/78 | HR 74 | Temp 98.0°F | Ht 63.5 in | Wt 176.8 lb

## 2011-03-30 DIAGNOSIS — M25512 Pain in left shoulder: Secondary | ICD-10-CM

## 2011-03-30 DIAGNOSIS — M25519 Pain in unspecified shoulder: Secondary | ICD-10-CM

## 2011-03-30 DIAGNOSIS — S2239XA Fracture of one rib, unspecified side, initial encounter for closed fracture: Secondary | ICD-10-CM

## 2011-03-30 MED ORDER — TRAMADOL HCL 50 MG PO TABS: 50.0000 mg | ORAL_TABLET | Freq: Four times a day (QID) | ORAL | Status: AC | PRN

## 2011-03-30 MED ORDER — TIZANIDINE HCL 4 MG PO TABS: 4.0000 mg | ORAL_TABLET | Freq: Every evening | ORAL | Status: AC

## 2011-03-30 MED ORDER — HYDROCODONE-ACETAMINOPHEN 5-500 MG PO TABS: 1.0000 | ORAL_TABLET | Freq: Four times a day (QID) | ORAL | Status: DC | PRN

## 2011-03-30 NOTE — Telephone Encounter (Signed)
Already discussed with patient

## 2011-03-30 NOTE — Telephone Encounter (Signed)
Radiology called report, Nondisplaced lateral left sixth rib fracture - no evidence of  pneumothorax or definite pleural effusion.  Probable degenerative changes/osteophytic spurring along the  humeral head. If pain continues or there is strong clinical  suspicion for humeral head fracture, then CT or MRI may be helpful  for further evaluation.

## 2011-03-30 NOTE — Progress Notes (Signed)
  Subjective:    [name], female    DOB: 1940-09-10, 70 y.o.   MRN: [id number]  HPI  [name], a 70 y.o. female presents today in the office for the following:    Fell on shoulder blade Sunday. Hurts pretty bad and having some muscle spasm. Sneezing will hurt a lot. Having some trouble turning over. There is bruising on the posterior aspect of the patient's left thorax. She is having pain with rotation.  She is able to dress herself unable to move her left shoulder in all directions. She did not strike her head. She is having some trouble moving in trouble doing other things around the house.  The PMH, PSH, Social History, Family History, Medications, and allergies have been reviewed in [facility], and have been updated if relevant.   Review of Systems REVIEW OF SYSTEMS  GEN: No fevers, chills. Nontoxic. Primarily MSK c/o today. MSK: Detailed in the HPI GI: tolerating PO intake without difficulty Neuro: No numbness, parasthesias, or tingling associated. Otherwise the pertinent positives of the ROS are noted above.      Objective:   Physical Exam   Physical Exam  Blood pressure 120/78, pulse 74, temperature 98 F (36.7 C), temperature source Oral, height 5' 3.5" (1.613 m), weight 176 lb 12.8 oz (80.196 kg), SpO2 97.00%.  GEN: Well-developed,well-nourished,in no acute distress; alert,appropriate and cooperative throughout examination HEENT: Normocephalic and atraumatic without obvious abnormalities. Ears, externally no deformities PULM: Breathing comfortably in no respiratory distress EXT: No clubbing, cyanosis, or edema PSYCH: Normally interactive. Cooperative during the interview. Pleasant. Friendly and conversant. Not anxious or depressed appearing. Normal, full affect.  Shoulder: L Inspection: No muscle wasting or winging Ecchymosis/edema: significant bruising posterior thorax AC joint, scapula, clavicle: NT Cervical spine: NT, full ROM Spurling's:  neg Abduction: full, 5/5 Flexion: full, 5/5 IR, full, lift-off: 5/5 ER at neutral: full, 5/5 AC crossover and compression: neg Neer: neg Hawkins: neg Drop Test: neg Empty Can: neg Supraspinatus insertion: NT Bicipital groove: NT Speed's: neg Yergason's: neg Sulcus sign: neg Scapular dyskinesis: none C5-T1 intact Sensation intact Grip 5/5       Assessment & Plan:   1. Left shoulder pain  DG Shoulder Left, traMADol (ULTRAM) 50 MG tablet, HYDROcodone-acetaminophen (VICODIN) 5-500 MG per tablet, tiZANidine (ZANAFLEX) 4 MG tablet  2. Rib fracture      Reassured her about her shoulder. I do not think there is a shoulder internal arrangement. I've given her some tramadol and Vicodin to use when necessary as well as Zanaflex. Also, she can use moist heat, which seemed to help yesterday.  X-ray, left shoulder. There is evidence of a sixth rib fracture. There is no evidence for humerus fracture or clavicle fracture or any other occult fracture.  A UNIVERSAL FRACTURE CHARGE HAS BEEN APPLIED IN THE CARE OF THIS INJURY.  No co-pay should be applied in the future, and there is a 90 day follow-up period for subsequent care of this injury.   DOI 03/28/2011

## 2011-04-08 ENCOUNTER — Other Ambulatory Visit: Payer: Self-pay | Admitting: *Deleted

## 2011-04-08 DIAGNOSIS — M25512 Pain in left shoulder: Secondary | ICD-10-CM

## 2011-04-08 MED ORDER — HYDROCODONE-ACETAMINOPHEN 5-500 MG PO TABS: 1.0000 | ORAL_TABLET | Freq: Four times a day (QID) | ORAL | Status: AC | PRN

## 2011-04-08 NOTE — Telephone Encounter (Signed)
She has taken a significant amount of pain medication since I saw her. Decrease its use.   Vicodin 5/500. 1 po q 6 hours prn pain. #40, 0 refills

## 2011-04-08 NOTE — Telephone Encounter (Signed)
RX CALLED TO PHARMACY  

## 2011-04-22 ENCOUNTER — Ambulatory Visit (INDEPENDENT_AMBULATORY_CARE_PROVIDER_SITE_OTHER): Payer: Medicare Other | Admitting: Family Medicine

## 2011-04-22 ENCOUNTER — Encounter: Payer: Self-pay | Admitting: Family Medicine

## 2011-04-22 DIAGNOSIS — S2239XA Fracture of one rib, unspecified side, initial encounter for closed fracture: Secondary | ICD-10-CM

## 2011-04-22 DIAGNOSIS — J449 Chronic obstructive pulmonary disease, unspecified: Secondary | ICD-10-CM

## 2011-04-22 MED ORDER — CHLORPHENIRAMINE-HYDROCODONE 8-10 MG/5ML PO LQCR: 5.0000 mL | Freq: Two times a day (BID) | ORAL | Status: DC | PRN

## 2011-04-22 NOTE — Patient Instructions (Signed)
Come back for worsening anxiety of breathing or air hunger.

## 2011-04-22 NOTE — Assessment & Plan Note (Signed)
Discussed approach to pain control and length of pain. Pt understood. Discussed splinting with pillow. Because of location, will also suggest rib belt for stabilization. Use heat as discussed. Positioning also discussed. (for instance, leaning against a chair, etc.) Also discussed need for routine aeration of the lungs to avoid collapse or atelectasis/pneumonia. Script for Tussionex to avoid coughing at night.

## 2011-04-22 NOTE — Progress Notes (Signed)
  Subjective:    [name], female    DOB: [birth year], 70 y.o.   MRN: [id number]  HPI Pt is here as acute appt with her husband for a cracked rib. She fell the 4th of November and had chest and rib xrays that showed a cracked rib. She has had significant discomfort since with it moving slightly laterally and she was ultimately worried that she might have a blood clot. She had one episode of severe SOB at night during a coughing fit that had caused her svere pain and she had a very difficult time with trying to get her breath. Using Tussionex and uping her O2 to 4l for a feqw minutes helped her. We discussed O2/CO2 levels and breathing drive. She is O2 dependent with her baseline COPD and now appreciates not increasing O2 over 3l acutely for prolonged periods. She requests a prescription of her husband's Tussionex.    Review of Systems  Constitutional: Negative for fever, chills, diaphoresis, activity change and fatigue.  HENT: Negative for ear pain, congestion, rhinorrhea and postnasal drip.   Eyes: Negative for redness.  Respiratory: Negative for cough, chest tightness, shortness of breath and wheezing.   Cardiovascular: Negative for chest pain.  Psychiatric/Behavioral: Negative for hallucinations, behavioral problems, confusion, dysphoric mood, decreased concentration and agitation.       Objective:   Physical Exam  Constitutional: She appears well-developed and well-nourished. No distress.       Comfortable when seen with nml breathing rate.  HENT:  Head: Normocephalic and atraumatic.  Right Ear: External ear normal.  Left Ear: External ear normal.  Nose: Nose normal.  Mouth/Throat: Oropharynx is clear and moist. No oropharyngeal exudate.  Eyes: Conjunctivae and EOM are normal. Pupils are equal, round, and reactive to light.  Neck: Normal range of motion. Neck supple. No thyromegaly present.  Cardiovascular: Normal rate, regular rhythm and normal heart sounds.     Pulmonary/Chest: Effort normal and breath sounds normal. She has no wheezes. She has no rales.       Tender along lat collat clavicular line of left upper back and laterally toward axilla. No swelling, no echymosis. ROM of left shoulder also bothers her some. Lung sounds are clear.  Lymphadenopathy:    She has no cervical adenopathy.  Skin: She is not diaphoretic.          Assessment & Plan:

## 2011-04-22 NOTE — Assessment & Plan Note (Signed)
Her SOB and cough paroxysm were combo of pain from rib fracture, air hunger and need to cough. We discusswed all aspects of this. See below.

## 2011-06-24 ENCOUNTER — Encounter: Payer: Self-pay | Admitting: Family Medicine

## 2011-07-21 ENCOUNTER — Other Ambulatory Visit: Payer: Self-pay | Admitting: *Deleted

## 2011-07-21 MED ORDER — LOSARTAN POTASSIUM 50 MG PO TABS: 50.0000 mg | ORAL_TABLET | Freq: Every day | ORAL | Status: DC

## 2011-07-22 ENCOUNTER — Other Ambulatory Visit: Payer: Self-pay | Admitting: Allergy

## 2011-07-22 MED ORDER — BUDESONIDE-FORMOTEROL FUMARATE 160-4.5 MCG/ACT IN AERO: 2.0000 | INHALATION_SPRAY | Freq: Two times a day (BID) | RESPIRATORY_TRACT | Status: DC

## 2011-07-22 MED ORDER — ALBUTEROL SULFATE HFA 108 (90 BASE) MCG/ACT IN AERS: 2.0000 | INHALATION_SPRAY | RESPIRATORY_TRACT | Status: DC | PRN

## 2011-08-13 ENCOUNTER — Other Ambulatory Visit: Payer: Self-pay | Admitting: *Deleted

## 2011-08-13 MED ORDER — TIOTROPIUM BROMIDE MONOHYDRATE 18 MCG IN CAPS: 18.0000 ug | ORAL_CAPSULE | Freq: Every day | RESPIRATORY_TRACT | Status: DC

## 2011-08-23 DIAGNOSIS — M8580 Other specified disorders of bone density and structure, unspecified site: Secondary | ICD-10-CM

## 2011-08-23 HISTORY — DX: Other specified disorders of bone density and structure, unspecified site: M85.80

## 2011-08-23 HISTORY — PX: OTHER SURGICAL HISTORY: SHX169

## 2011-09-01 ENCOUNTER — Other Ambulatory Visit: Payer: Self-pay | Admitting: Family Medicine

## 2011-09-01 DIAGNOSIS — E785 Hyperlipidemia, unspecified: Secondary | ICD-10-CM | POA: Insufficient documentation

## 2011-09-01 DIAGNOSIS — I10 Essential (primary) hypertension: Secondary | ICD-10-CM

## 2011-09-06 ENCOUNTER — Other Ambulatory Visit (INDEPENDENT_AMBULATORY_CARE_PROVIDER_SITE_OTHER): Payer: MEDICARE

## 2011-09-06 DIAGNOSIS — I10 Essential (primary) hypertension: Secondary | ICD-10-CM

## 2011-09-06 DIAGNOSIS — E785 Hyperlipidemia, unspecified: Secondary | ICD-10-CM

## 2011-09-06 LAB — COMPREHENSIVE METABOLIC PANEL
AST: 25 U/L (ref 0–37)
Albumin: 4.1 g/dL (ref 3.5–5.2)
BUN: 11 mg/dL (ref 6–23)
CO2: 30 mEq/L (ref 19–32)
Calcium: 9.1 mg/dL (ref 8.4–10.5)
Chloride: 101 mEq/L (ref 96–112)
Creatinine, Ser: 0.8 mg/dL (ref 0.4–1.2)
GFR: 73.16 mL/min (ref >60.00)
Potassium: 4.4 mEq/L (ref 3.5–5.1)

## 2011-09-06 LAB — LDL CHOLESTEROL, DIRECT: Direct LDL: 135.9 mg/dL

## 2011-09-06 LAB — LIPID PANEL
Cholesterol: 228 mg/dL — ABNORMAL HIGH (ref 0–200)
VLDL: 33.2 mg/dL (ref 0.0–40.0)

## 2011-09-10 ENCOUNTER — Encounter: Payer: Self-pay | Admitting: Family Medicine

## 2011-09-10 ENCOUNTER — Ambulatory Visit (INDEPENDENT_AMBULATORY_CARE_PROVIDER_SITE_OTHER): Payer: MEDICARE | Admitting: Family Medicine

## 2011-09-10 VITALS — BP 122/78 | HR 76 | Temp 98.8°F | Ht 64.0 in | Wt 176.8 lb

## 2011-09-10 DIAGNOSIS — R7989 Other specified abnormal findings of blood chemistry: Secondary | ICD-10-CM

## 2011-09-10 DIAGNOSIS — Z0000 Encounter for general adult medical examination without abnormal findings: Secondary | ICD-10-CM | POA: Insufficient documentation

## 2011-09-10 DIAGNOSIS — R5383 Other fatigue: Secondary | ICD-10-CM

## 2011-09-10 DIAGNOSIS — Z780 Asymptomatic menopausal state: Secondary | ICD-10-CM

## 2011-09-10 DIAGNOSIS — R5381 Other malaise: Secondary | ICD-10-CM

## 2011-09-10 DIAGNOSIS — E785 Hyperlipidemia, unspecified: Secondary | ICD-10-CM

## 2011-09-10 DIAGNOSIS — E538 Deficiency of other specified B group vitamins: Secondary | ICD-10-CM

## 2011-09-10 DIAGNOSIS — J449 Chronic obstructive pulmonary disease, unspecified: Secondary | ICD-10-CM

## 2011-09-10 LAB — CBC WITH DIFFERENTIAL/PLATELET
Basophils Absolute: 0 10*3/uL (ref 0.0–0.1)
Basophils Relative: 0.4 % (ref 0.0–3.0)
Eosinophils Absolute: 0.1 10*3/uL (ref 0.0–0.7)
Hemoglobin: 13.2 g/dL (ref 12.0–15.0)
MCHC: 33.3 g/dL (ref 30.0–36.0)
MCV: 94.2 fl (ref 78.0–100.0)
Monocytes Absolute: 0.5 10*3/uL (ref 0.1–1.0)
Neutro Abs: 4.4 10*3/uL (ref 1.4–7.7)
Neutrophils Relative %: 62.5 % (ref 43.0–77.0)
RBC: 4.22 Mil/uL (ref 3.87–5.11)
RDW: 13.2 % (ref 11.5–14.6)

## 2011-09-10 LAB — VITAMIN B12: Vitamin B-12: 410 pg/mL (ref 211–911)

## 2011-09-10 MED ORDER — OMEPRAZOLE 40 MG PO CPDR: 40.0000 mg | DELAYED_RELEASE_CAPSULE | Freq: Every day | ORAL | Status: DC

## 2011-09-10 NOTE — Assessment & Plan Note (Signed)
Discussed numbers.  Not at goal on pravastatin, recommended dietary changes, if remaining above goal, will change to more potent statin.

## 2011-09-10 NOTE — Patient Instructions (Addendum)
Schedule mammogram when next due - if any trouble scheduling, then let us know and we can make referral. Pass by [name] [name]'s office to schedule bone density scan. Try to get most or all of your calcium from your food--aim for 1000 mg/day for women up to 50 and men up to 70 and 1200 mg/day for women over 50 and men over 70. To figure out dietary calcium: 300 mg/day from all non dairy foods plus 300 mg per cup of milk, other dairy, or fortified juice. Non dairy foods that contain calcium: Kale, oranges, sardines, oatmeal, soy milk/soybeans, salmon, white beans, dried figs, turnip greens, almonds, broccoli, tofu. Increase dietary vitamin D intake.  That means more fish - mackerel, tuna, salmon, herring, sardines, catfish, cod liver oil, and eggs.  And small doses of sunshine throughout the day. Call your insurace about the shingles shot to see if it is covered or how much it would cost and where is cheaper (here or pharmacy).  If you want to receive here, call for nurse visit. Blood work today.  We will call you with results. Good to see you today, call us with questions.

## 2011-09-10 NOTE — Assessment & Plan Note (Signed)
Remaining elevated - check A1c next blood draw.

## 2011-09-10 NOTE — Progress Notes (Signed)
Subjective:    [name], female    DOB: [birth year], 71 y.o.   MRN: [id number]  HPI CC: medicare wellness  Vision deteriorating - last eye exam was 3 years ago, due for rpt will schedule. Hearing - passed hearing screen.  On chronic O2 at 3L for severe COPD.  Does get intermittent ST and drainage.  Mild cough at baseline.   Bad bout with depression this week unsure inciting factor - on zoloft and wellbutrin since 1997.  Staying fatigued.  Chronic daily doxy 100mg  for the last year for recurrent UTIs (Dr Achilles Dunk)  Fall last year, cracked rib.  no other falls since. Depression - currently treating.  Preventative: Last CPE last year. Colonoscopy - 09/2009 with 3 polyps, rec rpt 5 yrs.  Marina Goodell) Well woman exam - doesn't get done anymore. Mammogram - in fall 2012.  Always normal. Dexa - last done a long time ago.  Interested in rpt.  Does drink good amt milk, discussed recommended amt.  Mother with h/o osteoporosis. Flu 2012 Td 2010 Pneumonia 2010 Shingles - interested - will check. advanced directed - would want HCPOA to be husband, shelton.  Does have living will but not updated.  Medications and allergies reviewed and updated in chart.  Past histories reviewed and updated if relevant as below. Patient Active Problem List  Diagnoses  . BENIGN NEOPLASM OF COLON  . ANXIETY  . DEPRESSION  . HYPERTENSION  . SINUS TACHYCARDIA  . ALLERGIC RHINITIS  . COPD  . FATIGUE, CHRONIC  . STRESS INCONTINENCE- DR. POLLOCK  . HYPERGLYCEMIA  . HYPOXEMIA  . HEMATURIA, HX OF  . Closed rib fractures  . Dyslipidemia   Past Medical History  Diagnosis Date  . Diverticular disease 10/09/09    CT abd no sigmoid mass DDD L5/S1  . Peptic ulcer   . GERD (gastroesophageal reflux disease)   . Hiatal hernia   . Hypertension   . Dyslipidemia   . Chronic cystitis     recurrent, on suppressive doxy (uro Cope)  . Hematuria   . COPD, severe 01/27/07    PFT, FEV 1.22 (57%), FEV  1% 31, TLC 6.18 (127%), DLCO 60%, no BD  . Lung cancer 1987  . Spinal abscess 2000    secondary to periodontal disease  . Osteoarthritis   . Depression with anxiety    Past Surgical History  Procedure Date  . Tubal ligation     bilateral  . Bladder surgery   . Breast biopsy 03/99    left  . Mouth surgery 2000    [facility] pneumonia sepsis due to gum surgery  . Cystoscopy 02/11/07 and 09/25/08    Dr. Achilles Dunk, normal  . Cholecystectomy 02/10/09  . Total abdominal hysterectomy 07/18/01    BSO, vag repair secondary prolapse   History  Substance Use Topics  . Smoking status: Former Smoker    Quit date: 05/29/1995  . Smokeless tobacco: Not on file  . Alcohol Use: Yes     wine daily   Family History  Problem Relation Age of Onset  . Pancreatitis Mother   . Heart disease Father   . Hypertension Father   . Emphysema Father   . Arthritis Brother   . Hypertension Brother    Allergies  Allergen Reactions  . Penicillins     REACTION: hives   Current Outpatient Prescriptions on File Prior to Visit  Medication Sig Dispense Refill  . albuterol (PROAIR HFA) 108 (90 BASE) MCG/ACT inhaler Inhale  2 puffs into the lungs every 4 (four) hours as needed.  1 Inhaler  0  . budesonide-formoterol (SYMBICORT) 160-4.5 MCG/ACT inhaler Inhale 2 puffs into the lungs 2 (two) times daily.  1 Inhaler  0  . buPROPion (WELLBUTRIN SR) 150 MG 12 hr tablet Take 1 tablet (150 mg total) by mouth 2 (two) times daily.  60 tablet  11  . diphenhydrAMINE (BENADRYL) 25 mg capsule Take 2 by mouth at bedtime as needed       . guaifenesin (HUMIBID E) 400 MG TABS Take by mouth. Take 1 and 1/2 two times a day as needed      . loratadine (CLARITIN) 10 MG tablet Take 10 mg by mouth daily.        Marland Kitchen losartan (COZAAR) 50 MG tablet Take 1 tablet (50 mg total) by mouth daily.  30 tablet  7  . metoprolol tartrate (LOPRESSOR) 25 MG tablet Take 1 tablet (25 mg total) by mouth 2 (two) times daily.  60 tablet  11  . NON  FORMULARY Oxygen 3 liters       . pravastatin (PRAVACHOL) 80 MG tablet Take 1 tablet (80 mg total) by mouth at bedtime.  30 tablet  11  . sertraline (ZOLOFT) 100 MG tablet Take 100 mg by mouth daily.        Marland Kitchen tiotropium (SPIRIVA) 18 MCG inhalation capsule Place 1 capsule (18 mcg total) into inhaler and inhale daily.  30 capsule  5  . DISCONTD: omeprazole (PRILOSEC) 40 MG capsule Take 40 mg by mouth daily.           Review of Systems  Constitutional: Negative for fever, chills, activity change, appetite change, fatigue and unexpected weight change.  HENT: Negative for hearing loss and neck pain.   Eyes: Positive for visual disturbance.  Respiratory: Positive for cough and shortness of breath. Negative for chest tightness and wheezing.   Cardiovascular: Negative for chest pain, palpitations and leg swelling.  Gastrointestinal: Positive for constipation. Negative for nausea, vomiting, abdominal pain, diarrhea, blood in stool and abdominal distention.  Genitourinary: Negative for hematuria and difficulty urinating.  Musculoskeletal: Negative for myalgias and arthralgias.  Skin: Negative for rash.  Neurological: Negative for dizziness, seizures, syncope and headaches.  Hematological: Does not bruise/bleed easily.  Psychiatric/Behavioral: Positive for dysphoric mood. The patient is not nervous/anxious.        Objective:   Physical Exam  Nursing note and vitals reviewed. Constitutional: She is oriented to person, place, and time. She appears well-developed and well-nourished. No distress.  HENT:  Head: Normocephalic and atraumatic.  Right Ear: External ear normal.  Left Ear: External ear normal.  Nose: Nose normal.  Mouth/Throat: Oropharynx is clear and moist. No oropharyngeal exudate.  Eyes: Conjunctivae and EOM are normal. Pupils are equal, round, and reactive to light. No scleral icterus.  Neck: Normal range of motion. Neck supple. No thyromegaly present.  Cardiovascular: Normal rate,  regular rhythm, normal heart sounds and intact distal pulses.   No murmur heard. Pulses:      Radial pulses are 2+ on the right side, and 2+ on the left side.  Pulmonary/Chest: Effort normal and breath sounds normal. No respiratory distress. She has no wheezes. She has no rales. Right breast exhibits no inverted nipple, no mass, no nipple discharge, no skin change and no tenderness. Left breast exhibits no inverted nipple, no mass, no nipple discharge, no skin change and no tenderness.  Abdominal: Soft. Bowel sounds are normal. She exhibits no distension  and no mass. There is no tenderness. There is no rebound and no guarding.  Genitourinary:       declined  Musculoskeletal: Normal range of motion. She exhibits no edema.  Lymphadenopathy:    She has no cervical adenopathy.    She has no axillary adenopathy.       Right axillary: No lateral adenopathy present.       Left axillary: No lateral adenopathy present.      Right: No supraclavicular adenopathy present.       Left: No supraclavicular adenopathy present.  Neurological: She is alert and oriented to person, place, and time.       CN grossly intact, station and gait intact  Skin: Skin is warm and dry. No rash noted.  Psychiatric: She has a normal mood and affect. Her behavior is normal. Judgment and thought content normal.      Assessment & Plan:

## 2011-09-10 NOTE — Assessment & Plan Note (Signed)
I have personally reviewed the Medicare Annual Wellness questionnaire and have noted 1. The patient's medical and social history 2. Their use of alcohol, tobacco or illicit drugs 3. Their current medications and supplements 4. The patient's functional ability including ADL's, fall risks, home safety risks and hearing or visual impairment. 5. Diet and physical activity 6. Evidence for depression or mood disorders The patients weight, height, BMI have been recorded in the chart.  Hearing and vision has been addressed. I have made referrals, counseling and provided education to the patient based review of the above and I have provided the pt with a written personalized care plan for preventive services. See scanned questionairre. Advanced directives discussed: would want HCPOA to be husband, [name] [name].  Does have living will but not updated.  Reviewed preventative protocols and updated unless pt declined. Pt will check into shingles shot. Breast exam today.  Deferred pap/pelvic as s/p hysterectomy. Discussed dexa - will order.  Discussed calcium/vit D recommendations.

## 2011-09-10 NOTE — Assessment & Plan Note (Signed)
Seems chronic problem. Check B12 and vit D.

## 2011-09-21 ENCOUNTER — Ambulatory Visit: Payer: Self-pay | Admitting: Family Medicine

## 2011-09-21 ENCOUNTER — Encounter: Payer: Self-pay | Admitting: Family Medicine

## 2011-09-22 ENCOUNTER — Encounter: Payer: Self-pay | Admitting: *Deleted

## 2011-09-23 ENCOUNTER — Other Ambulatory Visit: Payer: Self-pay | Admitting: *Deleted

## 2011-09-23 MED ORDER — SERTRALINE HCL 100 MG PO TABS: 100.0000 mg | ORAL_TABLET | Freq: Every day | ORAL | Status: DC

## 2011-09-23 NOTE — Telephone Encounter (Signed)
Last refill 08/05/2011

## 2011-10-19 ENCOUNTER — Telehealth: Payer: Self-pay | Admitting: Family Medicine

## 2011-10-19 NOTE — Telephone Encounter (Signed)
Noted. Thanks.

## 2011-10-19 NOTE — Telephone Encounter (Signed)
Caller: Taaliyah/Patient; PCP: Eustaquio Boyden; CB#: [phone];[facility] regarding Tick Bite;  Arthea found a tick attached under her arm on 10/18/11.  Was able to remove all of the tick.  Today, redness has spread.  Entire red area is smaller than a penny.  Afebrile.  Denies pain.  Utilized Bites and Stings Guideline.  Home care advice given with parameters reviewed concerning when to call back.

## 2011-11-02 ENCOUNTER — Ambulatory Visit: Payer: Self-pay | Admitting: Ophthalmology

## 2011-11-03 ENCOUNTER — Other Ambulatory Visit: Payer: Self-pay | Admitting: *Deleted

## 2011-11-03 MED ORDER — BUPROPION HCL ER (SR) 150 MG PO TB12: 150.0000 mg | ORAL_TABLET | Freq: Two times a day (BID) | ORAL | Status: DC

## 2011-11-16 ENCOUNTER — Ambulatory Visit: Payer: Self-pay | Admitting: Ophthalmology

## 2011-11-18 ENCOUNTER — Other Ambulatory Visit: Payer: Self-pay

## 2011-11-18 NOTE — Telephone Encounter (Signed)
Ok to fill 

## 2011-11-19 MED ORDER — BUDESONIDE-FORMOTEROL FUMARATE 160-4.5 MCG/ACT IN AERO: 2.0000 | INHALATION_SPRAY | Freq: Two times a day (BID) | RESPIRATORY_TRACT | Status: DC

## 2011-12-09 ENCOUNTER — Ambulatory Visit (INDEPENDENT_AMBULATORY_CARE_PROVIDER_SITE_OTHER)
Admission: RE | Admit: 2011-12-09 | Discharge: 2011-12-09 | Disposition: A | Discharge status: Home / Self Care | Payer: MEDICARE | Source: Ambulatory Visit | Attending: Adult Health | Admitting: Adult Health

## 2011-12-09 ENCOUNTER — Encounter: Payer: Self-pay | Admitting: Adult Health

## 2011-12-09 ENCOUNTER — Ambulatory Visit (INDEPENDENT_AMBULATORY_CARE_PROVIDER_SITE_OTHER): Payer: MEDICARE | Admitting: Adult Health

## 2011-12-09 VITALS — BP 126/80 | HR 68 | Temp 97.3°F | Ht 64.0 in | Wt 175.8 lb

## 2011-12-09 DIAGNOSIS — J449 Chronic obstructive pulmonary disease, unspecified: Secondary | ICD-10-CM

## 2011-12-09 DIAGNOSIS — J4489 Other specified chronic obstructive pulmonary disease: Secondary | ICD-10-CM

## 2011-12-09 MED ORDER — LEVOFLOXACIN 500 MG PO TABS: 500.0000 mg | ORAL_TABLET | Freq: Every day | ORAL | Status: DC

## 2011-12-09 NOTE — Assessment & Plan Note (Signed)
Exacerbation  Check cxr   Plan;  Levaquin 500 mg  daily x10 days. Mucinex DM twice daily as needed. For cough and congestion. Fluids and rest. Saline nasal spray 2 puffs twice daily as needed. For nasal congestion. AYR Saline nasal gel twice daily as needed Please contact office for sooner follow up if symptoms do not improve or worsen or seek emergency care  Follow up Dr. Craige Cotta  In 4 months

## 2011-12-09 NOTE — Patient Instructions (Addendum)
Levaquin 500 mg  daily x10 days. Mucinex DM twice daily as needed. For cough and congestion. Fluids and rest. Saline nasal spray 2 puffs twice daily as needed. For nasal congestion. AYR Saline nasal gel twice daily as needed Please contact office for sooner follow up if symptoms do not improve or worsen or seek emergency care  Follow up Dr. Craige Cotta  In 4 months

## 2011-12-09 NOTE — Progress Notes (Signed)
  Subjective:    [name], female    DOB: Nov 04, 1940, 71 y.o.   MRN: [id number]  HPI 71 yo female with GOLD 3 COPD and hypoxemia.-on O2   12/09/2011 Acute OV  Complains of increased SOB, prod cough with thick green/brown mucus, chills/swets x2 weeks.  Denies wheezing, tightness.  Last seen 2011 . Says she has not needed [place] . No hosptializations/ER  for breathing since last ov  No xray for 3 years  OTC not helping.  No weight loss .     Review of Systems Constitutional:   No  weight loss, night sweats,  Fevers, chills, fatigue, or  lassitude.  HEENT:   No headaches,  Difficulty swallowing,  Tooth/dental problems, or  Sore throat,                No sneezing, itching, ear ache,  ++nasal congestion, post nasal drip,   CV:  No chest pain,  Orthopnea, PND, swelling in lower extremities, anasarca, dizziness, palpitations, syncope.   GI  No heartburn, indigestion, abdominal pain, nausea, vomiting, diarrhea, change in bowel habits, loss of appetite, bloody stools.   Resp:   No coughing up of blood.  Marland Kitchen  No chest wall deformity  Skin: no rash or lesions.  GU: no dysuria, change in color of urine, no urgency or frequency.  No flank pain, no hematuria   MS:  No joint pain or swelling.  No decreased range of motion.  No back pain.  Psych:  No change in mood or affect. No depression or anxiety.  No memory loss.         Objective:   Physical Exam GEN: A/Ox3; pleasant , NAD elderly on O2   HEENT:  /AT,  EACs-clear, TMs-wnl, NOSE-clear, THROAT-clear, no lesions, no postnasal drip or exudate noted.   NECK:  Supple w/ fair ROM; no JVD; normal carotid impulses w/o bruits; no thyromegaly or nodules palpated; no lymphadenopathy.  RESP  Coarse BS no accessory muscle use, no dullness to percussion  CARD:  RRR, no m/r/g  , no peripheral edema, pulses intact, no cyanosis or clubbing.  GI:   Soft & nt; nml bowel sounds; no organomegaly or masses detected.  Musco:  Warm bil, no deformities or joint swelling noted.   Neuro: alert, no focal deficits noted.    Skin: Warm, no lesions or rashes         Assessment & Plan:

## 2011-12-10 NOTE — Progress Notes (Signed)
Quick Note:  Called and spoke with patient. Informed patient of results and recs as listed below per TP. Patient verbalized understanding and had no further questions or concerns at this time. ______

## 2011-12-12 NOTE — Progress Notes (Signed)
Reviewed and agree with assessment/plan. 

## 2011-12-20 ENCOUNTER — Ambulatory Visit: Payer: Self-pay | Admitting: Ophthalmology

## 2012-01-10 ENCOUNTER — Other Ambulatory Visit: Payer: Self-pay | Admitting: *Deleted

## 2012-01-10 MED ORDER — PRAVASTATIN SODIUM 80 MG PO TABS: 80.0000 mg | ORAL_TABLET | Freq: Every day | ORAL | Status: DC

## 2012-01-10 MED ORDER — METOPROLOL TARTRATE 25 MG PO TABS: 25.0000 mg | ORAL_TABLET | Freq: Two times a day (BID) | ORAL | Status: DC

## 2012-03-29 ENCOUNTER — Encounter: Payer: Self-pay | Admitting: Cardiovascular Disease

## 2012-04-06 ENCOUNTER — Other Ambulatory Visit: Payer: Self-pay | Admitting: *Deleted

## 2012-04-06 MED ORDER — TIOTROPIUM BROMIDE MONOHYDRATE 18 MCG IN CAPS: 18.0000 ug | ORAL_CAPSULE | Freq: Every day | RESPIRATORY_TRACT | Status: DC

## 2012-04-26 ENCOUNTER — Other Ambulatory Visit: Payer: Self-pay | Admitting: *Deleted

## 2012-04-26 MED ORDER — LOSARTAN POTASSIUM 50 MG PO TABS: 50.0000 mg | ORAL_TABLET | Freq: Every day | ORAL | Status: DC

## 2012-04-27 ENCOUNTER — Ambulatory Visit (INDEPENDENT_AMBULATORY_CARE_PROVIDER_SITE_OTHER): Payer: MEDICARE | Admitting: Pulmonary Disease

## 2012-04-27 ENCOUNTER — Encounter: Payer: Self-pay | Admitting: Pulmonary Disease

## 2012-04-27 VITALS — BP 130/80 | HR 73 | Temp 97.0°F | Ht 64.0 in | Wt 176.8 lb

## 2012-04-27 DIAGNOSIS — J9611 Chronic respiratory failure with hypoxia: Secondary | ICD-10-CM | POA: Insufficient documentation

## 2012-04-27 DIAGNOSIS — R0902 Hypoxemia: Secondary | ICD-10-CM

## 2012-04-27 DIAGNOSIS — J9610 Chronic respiratory failure, unspecified whether with hypoxia or hypercapnia: Secondary | ICD-10-CM

## 2012-04-27 DIAGNOSIS — J439 Emphysema, unspecified: Secondary | ICD-10-CM

## 2012-04-27 DIAGNOSIS — J438 Other emphysema: Secondary | ICD-10-CM

## 2012-04-27 MED ORDER — BUDESONIDE 0.25 MG/2ML IN SUSP: 0.2500 mg | Freq: Every day | RESPIRATORY_TRACT | Status: DC

## 2012-04-27 MED ORDER — ALBUTEROL SULFATE (2.5 MG/3ML) 0.083% IN NEBU: 2.5000 mg | INHALATION_SOLUTION | Freq: Four times a day (QID) | RESPIRATORY_TRACT | Status: DC

## 2012-04-27 NOTE — Patient Instructions (Signed)
Stop symbicort Continue spiriva one puff daily Budesonide (pulmicort) one vial nebulized twice per day Albuterol one vial nebulized twice per day, and as needed in between Proair two puffs as needed for cough, wheeze, chest congestion, or shortness of breath Change oxygen to 4 liters with exertion, and 3 liters with rest Will arrange for overnight oxygen test Will arrange for pulmonary rehab referral Follow up in 8 weeks

## 2012-04-27 NOTE — Assessment & Plan Note (Signed)
She maintains her oxygen saturation at rest with 3 liters.  However, she has oxygen desaturation with exertion in spite of using 3 liters.  This was improved with 4 liters.  This may explain some of her exercise limitation.  Will change her oxygen set up to 4 liters with exertion, and continue 3 liters at rest for now.  Will arrange for ONO with 3 liters.

## 2012-04-27 NOTE — Assessment & Plan Note (Signed)
She has progression of her COPD.  I am concerned she has difficulty using some of her inhalers, and would likely do better with nebulizer therapy.  Will have her continue spiriva.  Will have her stop symbicort, and have her use albuterol and pulmicort via nebulizer bid.  She can use albuterol nebulizer and/or proair as needed in between.  Will arrange for pulmonary rehab referral.  She has requested this to be set up at [facility] if possible.  If she is not improved some at next visit, then may need to consider adding daliresp, theophylline and/or low dose chronic prednisone.

## 2012-04-27 NOTE — Progress Notes (Signed)
Chief Complaint  Patient presents with  . Follow-up    Pt reports she gets really SOB and tired esp after eating, having pnd was abx x2wks ago for this and it went away but came back --denies any other symptoms     History of Present Illness: [name] [name] is a 71 y.o. female former smoker with COPD and chronic hypoxic respiratory failure.  Hx of lung cancer s/p RULectomy 1987.  I last saw Ms . [name] in December 2011.  She was treated for an exacerbation in July 2013 by Rubye Oaks.  She has been getting more short of breath with activity.  This is especially so after eating or walking up stairs.  She is using 3 liters oxygen 24/7.  She has a portable pulse oximeter, and her oxygen level gets into the 80's sometimes after activities.  She will get clammy and sweaty when her breathing gets worse, and oxygen is low.  She does not have much cough, wheeze, or sputum.  She denies chest pain, palpitations, or leg swelling.  She has been using her proair more, and this helps some.  She is using symbicort and spiriva.  She feels that spiriva helps the most, and notices her breathing is worse if she misses a dose.  Tests: PFT 01/27/07>>FEV1 1.22(57%), FEV1% 31, TLC 6.18 (127%), DLCO 60%, no BD CXR 12/09/11>>Hyperinflation, emphysema changes Spirometry 04/27/12>>FEV1 0.82 (38%), FEV1% 39  Past Medical History  Diagnosis Date  . Diverticular disease 10/09/09    CT abd no sigmoid mass DDD L5/S1  . Peptic ulcer   . GERD (gastroesophageal reflux disease)   . Hiatal hernia   . Hypertension   . Dyslipidemia   . Chronic cystitis     recurrent, on suppressive doxy (uro Cope)  . Hematuria   . COPD, severe 01/27/07    PFT, FEV 1.22 (57%), FEV 1% 31, TLC 6.18 (127%), DLCO 60%, no BD  . Lung cancer 1987  . Spinal abscess 2000    secondary to periodontal disease  . Osteoarthritis   . Depression with anxiety   . Osteopenia 08/2011    DEXA: L femur -1.6    Past Surgical History   Procedure Date  . Tubal ligation     bilateral  . Bladder surgery   . Breast biopsy 03/99    left  . Mouth surgery 2000    [facility] pneumonia sepsis due to gum surgery  . Cystoscopy 02/11/07 and 09/25/08    Dr. Achilles Dunk, normal  . Cholecystectomy 02/10/09  . Total abdominal hysterectomy 07/18/01    BSO, vag repair secondary prolapse  . Replacement total knee 09/2009    Right    Current Outpatient Prescriptions on File Prior to Visit  Medication Sig Dispense Refill  . albuterol (PROAIR HFA) 108 (90 BASE) MCG/ACT inhaler Inhale 2 puffs into the lungs every 4 (four) hours as needed.  1 Inhaler  0  . budesonide-formoterol (SYMBICORT) 160-4.5 MCG/ACT inhaler Inhale 2 puffs into the lungs 2 (two) times daily.  1 Inhaler  11  . buPROPion (WELLBUTRIN SR) 150 MG 12 hr tablet Take 1 tablet (150 mg total) by mouth 2 (two) times daily.  60 tablet  6  . diphenhydrAMINE (BENADRYL) 25 mg capsule Take 2 by mouth at bedtime as needed       . doxycycline (DORYX) 100 MG DR capsule Take 100 mg by mouth daily.      Marland Kitchen guaifenesin (HUMIBID E) 400 MG TABS Take by mouth. Take  1 and 1/2 two times a day as needed      . loratadine (CLARITIN) 10 MG tablet Take 10 mg by mouth daily.        Marland Kitchen losartan (COZAAR) 50 MG tablet Take 1 tablet (50 mg total) by mouth daily.  30 tablet  5  . metoprolol tartrate (LOPRESSOR) 25 MG tablet Take 1 tablet (25 mg total) by mouth 2 (two) times daily.  60 tablet  11  . NON FORMULARY Oxygen 3 liters       . omeprazole (PRILOSEC) 40 MG capsule Take 1 capsule (40 mg total) by mouth daily.  30 capsule  11  . pravastatin (PRAVACHOL) 80 MG tablet Take 1 tablet (80 mg total) by mouth at bedtime.  30 tablet  11  . psyllium (METAMUCIL) 58.6 % powder Take 1 packet by mouth daily.      . sertraline (ZOLOFT) 100 MG tablet Take 1 tablet (100 mg total) by mouth daily.  30 tablet  11  . tiotropium (SPIRIVA) 18 MCG inhalation capsule Place 1 capsule (18 mcg total) into inhaler and inhale  daily.  30 capsule  5  . traMADol (ULTRAM) 50 MG tablet Take 1 tablet by mouth Every 6 hours as needed.        Allergies  Allergen Reactions  . Penicillins     REACTION: hives    Physical Exam: Filed Vitals:   04/27/12 1331  BP: 130/80  Pulse: 73  Temp: 97 F (36.1 C)  TempSrc: Oral  Height: 5\' 4"  (1.626 m)  Weight: 176 lb 12.8 oz (80.196 kg)  SpO2: 97%  ,  Current Encounter SPO2  04/27/12 1331 97%  12/09/11 1630 95%  03/30/11 1051 97%    Wt Readings from Last 3 Encounters:  04/27/12 176 lb 12.8 oz (80.196 kg)  12/09/11 175 lb 12.8 oz (79.742 kg)  09/10/11 176 lb 12 oz (80.173 kg)    Body mass index is 30.35 kg/(m^2).   General - No distress, wearing oxygen ENT - No sinus tenderness, clear nasal discharge, no oral exudate, no LAN Cardiac - s1s2 regular, no murmur Chest - Barrel chest, prolonged exhalation, diminished breath sounds, no wheeze/rales Back - No focal tenderness Abd - Soft, non-tender Ext - No edema Neuro - Normal strength Skin - No rashes Psych - Normal mood, and behavior   12/09/2011  *RADIOLOGY REPORT*   Clinical Data: Cough and congestion   CHEST - 2 VIEW   Comparison: Chest radiograph 09/14/2007   Findings: Stable cardiac silhouette.  There is vascular clips in the right hilum unchanged.  Lungs are hyperinflated.  No effusion, infiltrate, or pneumothorax. IMPRESSION:  1. No acute cardiopulmonary process. 2.  Emphysematous change.   Original Report Authenticated By: Genevive Bi, M.D.    Assessment/Plan:  [name], MD  Pulmonary/Critical Care/Sleep Pager:  [phone] 04/27/2012, 1:36 PM

## 2012-05-24 DIAGNOSIS — S638X2A Sprain of other part of left wrist and hand, initial encounter: Secondary | ICD-10-CM

## 2012-05-24 HISTORY — DX: Sprain of other part of left wrist and hand, initial encounter: S63.8X2A

## 2012-06-22 ENCOUNTER — Encounter: Payer: Self-pay | Admitting: Pulmonary Disease

## 2012-06-22 ENCOUNTER — Ambulatory Visit (INDEPENDENT_AMBULATORY_CARE_PROVIDER_SITE_OTHER): Payer: Medicare Other | Admitting: Pulmonary Disease

## 2012-06-22 VITALS — BP 132/78 | HR 76 | Temp 97.8°F | Ht 64.0 in | Wt 178.2 lb

## 2012-06-22 DIAGNOSIS — J439 Emphysema, unspecified: Secondary | ICD-10-CM

## 2012-06-22 DIAGNOSIS — J438 Other emphysema: Secondary | ICD-10-CM

## 2012-06-22 DIAGNOSIS — J9610 Chronic respiratory failure, unspecified whether with hypoxia or hypercapnia: Secondary | ICD-10-CM

## 2012-06-22 DIAGNOSIS — R0902 Hypoxemia: Secondary | ICD-10-CM

## 2012-06-22 DIAGNOSIS — J9611 Chronic respiratory failure with hypoxia: Secondary | ICD-10-CM

## 2012-06-22 NOTE — Assessment & Plan Note (Signed)
She is to continue with 3 liters oxygen at rest, and 4 liters oxygen with exertion.

## 2012-06-22 NOTE — Assessment & Plan Note (Signed)
Improved with change to nebulizer ICS and albuterol.  She is to continue this regimen with spiriva.  Reviewed proper use/indication of her rescue inhaler.  She has requested to have pulmonary follow up transferred to [facility] due to inconvenience of travelling to [place].  Will arrange for pulmonary follow up in 3 months with Dr. Heber [place].

## 2012-06-22 NOTE — Progress Notes (Signed)
Chief Complaint  Patient presents with  . Follow-up    Pt states that breathing has been doing well with the occassional spells of SOB.     History of Present Illness: [name] [name] is a 72 y.o. female former smoker with COPD and chronic hypoxic respiratory failure.  Hx of lung cancer s/p RULectomy 1987.  She has been doing better since last visit.  She has occasional cough with clear to green sputum.  She is not having as much wheezing.  She uses albuterol twice per day.  Using nebulizer seems to be working better.  She is scheduled to start pulmonary rehab at [facility] next week.  Tests: PFT 01/27/07>>FEV1 1.22(57%), FEV1% 31, TLC 6.18 (127%), DLCO 60%, no BD CXR 12/09/11>>Hyperinflation, emphysema changes Spirometry 04/27/12>>FEV1 0.82 (38%), FEV1% 39 04/27/12 Pulmonary rehab referral   Past Medical History  Diagnosis Date  . Diverticular disease 10/09/09    CT abd no sigmoid mass DDD L5/S1  . Peptic ulcer   . GERD (gastroesophageal reflux disease)   . Hiatal hernia   . Hypertension   . Dyslipidemia   . Chronic cystitis     recurrent, on suppressive doxy (uro Cope)  . Hematuria   . COPD, severe 01/27/07    PFT, FEV 1.22 (57%), FEV 1% 31, TLC 6.18 (127%), DLCO 60%, no BD  . Lung cancer 1987  . Spinal abscess 2000    secondary to periodontal disease  . Osteoarthritis   . Depression with anxiety   . Osteopenia 08/2011    DEXA: L femur -1.6    Past Surgical History  Procedure Date  . Tubal ligation     bilateral  . Bladder surgery   . Breast biopsy 03/99    left  . Mouth surgery 2000    [facility] pneumonia sepsis due to gum surgery  . Cystoscopy 02/11/07 and 09/25/08    Dr. Achilles Dunk, normal  . Cholecystectomy 02/10/09  . Total abdominal hysterectomy 07/18/01    BSO, vag repair secondary prolapse  . Replacement total knee 09/2009    Right    Current Outpatient Prescriptions on File Prior to Visit  Medication Sig Dispense Refill  . albuterol (PROVENTIL) (2.5  MG/3ML) 0.083% nebulizer solution Take 3 mLs (2.5 mg total) by nebulization 4 (four) times daily.  75 mL  12  . budesonide (PULMICORT) 0.25 MG/2ML nebulizer solution Take 2 mLs (0.25 mg total) by nebulization daily.  60 mL  12  . buPROPion (WELLBUTRIN SR) 150 MG 12 hr tablet Take 1 tablet (150 mg total) by mouth 2 (two) times daily.  60 tablet  6  . diphenhydrAMINE (BENADRYL) 25 mg capsule Take 2 by mouth at bedtime as needed       . doxycycline (DORYX) 100 MG DR capsule Take 100 mg by mouth daily.      Marland Kitchen guaifenesin (HUMIBID E) 400 MG TABS Take by mouth. Take 1 and 1/2 two times a day as needed      . loratadine (CLARITIN) 10 MG tablet Take 10 mg by mouth daily.        Marland Kitchen losartan (COZAAR) 50 MG tablet Take 1 tablet (50 mg total) by mouth daily.  30 tablet  5  . metoprolol tartrate (LOPRESSOR) 25 MG tablet Take 1 tablet (25 mg total) by mouth 2 (two) times daily.  60 tablet  11  . omeprazole (PRILOSEC) 40 MG capsule Take 1 capsule (40 mg total) by mouth daily.  30 capsule  11  . pravastatin (  PRAVACHOL) 80 MG tablet Take 1 tablet (80 mg total) by mouth at bedtime.  30 tablet  11  . psyllium (METAMUCIL) 58.6 % powder Take 1 packet by mouth daily.      . sertraline (ZOLOFT) 100 MG tablet Take 1 tablet (100 mg total) by mouth daily.  30 tablet  11  . albuterol (PROAIR HFA) 108 (90 BASE) MCG/ACT inhaler Inhale 2 puffs into the lungs every 4 (four) hours as needed.  1 Inhaler  0  . tiotropium (SPIRIVA) 18 MCG inhalation capsule Place 1 capsule (18 mcg total) into inhaler and inhale daily.  30 capsule  5    Allergies  Allergen Reactions  . Penicillins     REACTION: hives    Physical Exam: Filed Vitals:   06/22/12 1409  Height: 5\' 4"  (1.626 m)  Weight: 178 lb 3.2 oz (80.831 kg)  ,  Current Encounter SPO2  04/27/12 1331 97%  12/09/11 1630 95%  03/30/11 1051 97%    Wt Readings from Last 3 Encounters:  06/22/12 178 lb 3.2 oz (80.831 kg)  04/27/12 176 lb 12.8 oz (80.196 kg)  12/09/11 175 lb  12.8 oz (79.742 kg)    Body mass index is 30.59 kg/(m^2).   General - No distress, wearing oxygen ENT - No sinus tenderness, no oral exudate, no LAN Cardiac - s1s2 regular, no murmur Chest - Barrel chest, prolonged exhalation, diminished breath sounds, no wheeze/rales Back - No focal tenderness Abd - Soft, non-tender Ext - No edema Neuro - Normal strength Skin - No rashes Psych - Normal mood, and behavior   Assessment/Plan:  [name], MD [place] Pulmonary/Critical Care/Sleep Pager:  [phone] 06/22/2012, 2:15 PM

## 2012-06-22 NOTE — Patient Instructions (Signed)
Will arrange for follow up with Dr. Kendrick Fries in [place] office in 3 months

## 2012-06-27 ENCOUNTER — Encounter: Payer: Self-pay | Admitting: Pulmonary Disease

## 2012-07-10 ENCOUNTER — Encounter: Payer: Self-pay | Admitting: Family Medicine

## 2012-07-10 ENCOUNTER — Ambulatory Visit (INDEPENDENT_AMBULATORY_CARE_PROVIDER_SITE_OTHER): Payer: Medicare Other | Admitting: Family Medicine

## 2012-07-10 VITALS — BP 130/76 | HR 67 | Temp 98.3°F | Resp 16 | Ht 64.0 in | Wt 174.1 lb

## 2012-07-10 DIAGNOSIS — R0789 Other chest pain: Secondary | ICD-10-CM

## 2012-07-10 DIAGNOSIS — R202 Paresthesia of skin: Secondary | ICD-10-CM

## 2012-07-10 DIAGNOSIS — R209 Unspecified disturbances of skin sensation: Secondary | ICD-10-CM

## 2012-07-10 DIAGNOSIS — R200 Anesthesia of skin: Secondary | ICD-10-CM

## 2012-07-10 MED ORDER — GABAPENTIN 100 MG PO CAPS: 100.0000 mg | ORAL_CAPSULE | Freq: Every day | ORAL | Status: DC

## 2012-07-10 NOTE — Progress Notes (Signed)
Subjective:    [name], female    DOB: 1940/09/20, 72 y.o.   MRN: [id number]  HPI CC: L arm tingling during exercise  [name] [name] is a pleasant 72 yo with h/o HTN, lung cancer s/p RU lobectomy 1980s, osteoarthritis and osteopenia, and severe COPD O2 dependent by nasal cannula with 3L at rest and 4L with exertion, who recently started pulmonary rehab at [facility] and who presents today with concern for L arm paresthesias that started after working at rehab for 2 minutes so she was directed to our office for further evaluation.  This was her fourth visit to rehab.  States this isn't new - tends to get tingling in left arm that starts in upper arm and travels down to hand affecting all fingers.  Gets worse with exercise.  + intermittent mild numbness.  Present for the last year.  Seems to happen more when exerting herself, but has also happened occasionally at night.  Today had no chest tightness.  Occasional chest heaviness in right side of chest that comes on intermittently while at rest about every month, lasts a few minutes then goes away on its own.  Baseline dyspnea - improved with recent changes by pulm (inhalers to nebs and increase in pulse ox to 4L with exertion and 3L at rest).  Denies associated nausea or diaphoresis.  Occasional R neck pain.  Denies shooting pain down arm.  + wrist pain and swelling.  Denies palpitations.  Cardiac risk factors include dyslipidemia (not at goal), HTN (controlled) and father with h/o MI at age 56yo.  Sees Dr. Craige Cotta pulm, but planning on transferring care to Dr. Kendrick Fries in [place].  Chronic daily doxy 100mg  for recurrent UTIs (Dr Achilles Dunk).  Seen by Dr. Elease Hashimoto for surgical clearance 2009, echo with mild diastolic dysfunction and mildly elevated pulm pressures.  Unable to tolerate treadmill test.  Prior EKG dated 01/2008 - sinus tachycardia, normal axis, intervals, no ST/T changes. Today's EKG - NSR rate 60, normal axis, intervals, no  hypertrophy, no acute ST/T changes, unchanged compared to prior dated 2009  Past Medical History  Diagnosis Date  . Diverticular disease 10/09/09    CT abd no sigmoid mass DDD L5/S1  . Peptic ulcer   . GERD (gastroesophageal reflux disease)   . Hiatal hernia   . Hypertension   . Dyslipidemia   . Chronic cystitis     recurrent, on suppressive doxy (uro Cope)  . Hematuria   . COPD, severe 01/27/07       . Lung cancer 1987  . Spinal abscess 2000    secondary to periodontal disease  . Osteoarthritis   . Depression with anxiety   . Osteopenia 08/2011    DEXA: L femur -1.6   Past Surgical History  Procedure Laterality Date  . Tubal ligation      bilateral  . Bladder surgery    . Breast biopsy  03/99    left  . Mouth surgery  2000    [facility] pneumonia sepsis due to gum surgery  . Cystoscopy  02/11/07 and 09/25/08    Dr. Achilles Dunk, normal  . Cholecystectomy  02/10/09  . Total abdominal hysterectomy  07/18/01    BSO, vag repair secondary prolapse  . Replacement total knee  09/2009    Right   Family History  Problem Relation Age of Onset  . Pancreatitis Mother   . Heart disease Father   . Hypertension Father   . Emphysema Father   .  Arthritis Brother   . Hypertension Brother   . Cancer Maternal Aunt     ovarian cancer   Review of Systems Per HPI    Objective:   Physical Exam  Nursing note and vitals reviewed. Constitutional: She is oriented to person, place, and time. She appears well-developed and well-nourished. No distress.  O2 [place] in place  HENT:  Head: Normocephalic and atraumatic.  Mouth/Throat: Oropharynx is clear and moist. No oropharyngeal exudate.  Eyes: Conjunctivae and EOM are normal. Pupils are equal, round, and reactive to light. No scleral icterus.  Neck: Normal range of motion. Neck supple.  Neg spurling  Cardiovascular: Normal rate, regular rhythm, normal heart sounds and intact distal pulses.   No murmur heard. Pulmonary/Chest: Effort normal  and breath sounds normal. No respiratory distress. She has no wheezes. She has no rales.  Musculoskeletal: She exhibits no edema.  No midline spine tenderness at cervical region. No shoulder deformity.  No shoulder tenderness  Neurological: She is alert and oriented to person, place, and time. She has normal strength. No sensory deficit. She exhibits normal muscle tone.  Reflex Scores:      Bicep reflexes are 3+ on the right side and 3+ on the left side. Bilaterally brisk DTRs Sensation to temperature and light touch intact  Skin: Skin is warm and dry. No rash noted.  Psychiatric: She has a normal mood and affect.       Assessment & Plan:

## 2012-07-10 NOTE — Patient Instructions (Addendum)
Trial of gabapentin 100mg  nightly to see if any improvement in left arm tingling.  May go up to 100mg  twice daily. If not better with this, call me to schedule nerve conduction study. May also try tylenol as needed. Ok to continue pulmonary rehab - but if any chest tightness with this, stop and let me know for sooner appointment with cardiologist. I don't think this is coming from the heart.

## 2012-07-11 ENCOUNTER — Encounter: Payer: Self-pay | Admitting: Cardiovascular Disease

## 2012-07-11 DIAGNOSIS — R0789 Other chest pain: Secondary | ICD-10-CM | POA: Insufficient documentation

## 2012-07-11 DIAGNOSIS — R202 Paresthesia of skin: Secondary | ICD-10-CM | POA: Insufficient documentation

## 2012-07-11 NOTE — Assessment & Plan Note (Signed)
Does not comfort to specific dermatomal or peripheral nerve distribution. However, this does not sound cardiac in etiology, anticipate more discogenic or possible CTS.   rec treat with gabapentin and notify me if not improving for consideration of NCS or cervical imaging. Pt agrees with plan - will f/u 1 mo.

## 2012-07-11 NOTE — Assessment & Plan Note (Signed)
Longstanding. Not exertional. In h/o severe COPD, anticipate some respiratory component. EKG today - NSR rate 60, normal axis, intervals, no hypertrophy, no acute ST/T changes, unchanged compared to prior dated 2009 If persistent or worsening, would suggest cards eval but anticipate ok to continue pulm rehab for now.

## 2012-07-22 ENCOUNTER — Encounter: Payer: Self-pay | Admitting: Pulmonary Disease

## 2012-07-24 ENCOUNTER — Emergency Department: Payer: Self-pay | Admitting: Emergency Medicine

## 2012-07-24 LAB — URINALYSIS, COMPLETE
Bilirubin,UR: NEGATIVE
Glucose,UR: NEGATIVE mg/dL (ref 0–75)
Hyaline Cast: 17
Ketone: NEGATIVE
Nitrite: POSITIVE
Ph: 5 (ref 4.5–8.0)
Protein: 30
RBC,UR: 3 /HPF (ref 0–5)
Specific Gravity: 1.029 (ref 1.003–1.030)
Squamous Epithelial: 13
WBC UR: 12 /HPF (ref 0–5)

## 2012-07-24 LAB — CBC WITH DIFFERENTIAL/PLATELET
Comment - H1-Com1: NORMAL
Comment - H1-Com2: NORMAL
HCT: 43.3 % (ref 35.0–47.0)
Lymphocytes: 6 %
MCH: 30.8 pg (ref 26.0–34.0)
MCHC: 33.2 g/dL (ref 32.0–36.0)
Platelet: 146 10*3/uL — ABNORMAL LOW (ref 150–440)
RDW: 13.1 % (ref 11.5–14.5)
Segmented Neutrophils: 74 %
WBC: 9 10*3/uL (ref 3.6–11.0)

## 2012-07-24 LAB — COMPREHENSIVE METABOLIC PANEL
Alkaline Phosphatase: 49 U/L — ABNORMAL LOW (ref 50–136)
BUN: 16 mg/dL (ref 7–18)
Bilirubin,Total: 0.4 mg/dL (ref 0.2–1.0)
Calcium, Total: 8.5 mg/dL (ref 8.5–10.1)
Creatinine: 0.71 mg/dL (ref 0.60–1.30)
EGFR (African American): >60
EGFR (Non-African Amer.): >60
Glucose: 150 mg/dL — ABNORMAL HIGH (ref 65–99)
Potassium: 4.3 mmol/L (ref 3.5–5.1)
SGOT(AST): 46 U/L — ABNORMAL HIGH (ref 15–37)
SGPT (ALT): 34 U/L (ref 12–78)
Sodium: 138 mmol/L (ref 136–145)

## 2012-07-26 LAB — URINE CULTURE

## 2012-07-27 ENCOUNTER — Ambulatory Visit (INDEPENDENT_AMBULATORY_CARE_PROVIDER_SITE_OTHER): Payer: Medicare Other | Admitting: Family Medicine

## 2012-07-27 ENCOUNTER — Encounter: Payer: Self-pay | Admitting: Family Medicine

## 2012-07-27 VITALS — BP 142/76 | HR 100 | Temp 98.5°F | Wt 173.5 lb

## 2012-07-27 DIAGNOSIS — N390 Urinary tract infection, site not specified: Secondary | ICD-10-CM

## 2012-07-27 MED ORDER — CEPHALEXIN 500 MG PO CAPS: 500.0000 mg | ORAL_CAPSULE | Freq: Two times a day (BID) | ORAL | Status: DC

## 2012-07-27 NOTE — Patient Instructions (Addendum)
Urine culture returned growing klebsiella bacteria that was not completely sensitive to antibiotic chosen so we will change from cipro to keflex. Stop cipro, take keflex twice daily for 7 days. Let us know if returning fevers or not feeling well. Push fluids and rest.  May try cranberry juice for bladder health. Return in 3 months for medicare wellness visit prior fasting for blood work. Urinary Tract Infection Urinary tract infections (UTIs) can develop anywhere along your urinary tract. Your urinary tract is your body's drainage system for removing wastes and extra water. Your urinary tract includes two kidneys, two ureters, a bladder, and a urethra. Your kidneys are a pair of bean-shaped organs. Each kidney is about the size of your fist. They are located below your ribs, one on each side of your spine. CAUSES Infections are caused by microbes, which are microscopic organisms, including fungi, viruses, and bacteria. These organisms are so small that they can only be seen through a microscope. Bacteria are the microbes that most commonly cause UTIs. SYMPTOMS  Symptoms of UTIs may vary by age and gender of the patient and by the location of the infection. Symptoms in young women typically include a frequent and intense urge to urinate and a painful, burning feeling in the bladder or urethra during urination. Older women and men are more likely to be tired, shaky, and weak and have muscle aches and abdominal pain. A fever may mean the infection is in your kidneys. Other symptoms of a kidney infection include pain in your back or sides below the ribs, nausea, and vomiting. DIAGNOSIS To diagnose a UTI, your caregiver will ask you about your symptoms. Your caregiver also will ask to provide a urine sample. The urine sample will be tested for bacteria and white blood cells. White blood cells are made by your body to help fight infection. TREATMENT  Typically, UTIs can be treated with medication. Because  most UTIs are caused by a bacterial infection, they usually can be treated with the use of antibiotics. The choice of antibiotic and length of treatment depend on your symptoms and the type of bacteria causing your infection. HOME CARE INSTRUCTIONS  If you were prescribed antibiotics, take them exactly as your caregiver instructs you. Finish the medication even if you feel better after you have only taken some of the medication.  Drink enough water and fluids to keep your urine clear or pale yellow.  Avoid caffeine, tea, and carbonated beverages. They tend to irritate your bladder.  Empty your bladder often. Avoid holding urine for long periods of time.  Empty your bladder before and after sexual intercourse.  After a bowel movement, women should cleanse from front to back. Use each tissue only once. SEEK MEDICAL CARE IF:   You have back pain.  You develop a fever.  Your symptoms do not begin to resolve within 3 days. SEEK IMMEDIATE MEDICAL CARE IF:   You have severe back pain or lower abdominal pain.  You develop chills.  You have nausea or vomiting.  You have continued burning or discomfort with urination. MAKE SURE YOU:   Understand these instructions.  Will watch your condition.  Will get help right away if you are not doing well or get worse. Document Released: 02/17/2005 Document Revised: 11/09/2011 Document Reviewed: 06/18/2011 [facility] Patient Information 2013 [place], [place].

## 2012-07-27 NOTE — Assessment & Plan Note (Signed)
reviewed records from ER. UCx growing Klebsiella intermediate to cipro - so will change to keflex (Sensitive).  Treat with 7d course keflex 500mg  bid. Red flags to return discussed. See pt instructions for further recs rtc 3 mo for medicare wellness visit.

## 2012-07-27 NOTE — Progress Notes (Signed)
  Subjective:    [name], female    DOB: August 27, 1940, 72 y.o.   MRN: [id number]  HPI CC: ER f/u  [name] [name] is a pleasant 72 yo with h/o HTN, lung cancer s/p RU lobectomy 1980s, osteoarthritis and osteopenia, and severe COPD O2 dependent by nasal cannula with 3L at rest and 4L with exertion, who recently started pulmonary rehab at [facility]   Recent ER eval at [facility].  Sunday night started feeling ill, lower back ache, then started having nausea/vomiting, diarrhea.  + dysuria, urgency, frequency, incontinence.  + fevers/chils.  No hematuria, abd pain.  Had UTI - >100k Klebsiella pneumonia intermediate to cipro levo nitro, resistant to ampicillin, sensitive to cefazolin, ceftriaxone, imipenem, bactrim.  H/o Klebsiella UTI in past, felt similar Lab Results  Component Value Date   HGBA1C 5.7 03/25/2006   Past Medical History  Diagnosis Date  . Diverticular disease 10/09/09    CT abd no sigmoid mass DDD L5/S1  . Peptic ulcer   . GERD (gastroesophageal reflux disease)   . Hiatal hernia   . Hypertension   . Dyslipidemia   . Chronic cystitis     recurrent, on suppressive doxy (uro Cope)  . Hematuria   . COPD, severe 01/27/07       . Lung cancer 1987  . Spinal abscess 2000    secondary to periodontal disease  . Osteoarthritis   . Depression with anxiety   . Osteopenia 08/2011    DEXA: L femur -1.6    Review of Systems Per HPI    Objective:   Physical Exam  Nursing note and vitals reviewed. Constitutional: She appears well-developed and well-nourished. No distress.  HENT:  Head: Normocephalic and atraumatic.  Mouth/Throat: Oropharynx is clear and moist. No oropharyngeal exudate.  Eyes: Conjunctivae and EOM are normal. Pupils are equal, round, and reactive to light.  Neck: Normal range of motion. Neck supple.  Cardiovascular: Normal rate, regular rhythm, normal heart sounds and intact distal pulses.   No murmur heard. Pulmonary/Chest: Effort normal and  breath sounds normal. No respiratory distress. She has no wheezes. She has no rales.  Abdominal: Soft. Normal appearance and bowel sounds are normal. She exhibits no distension and no mass. There is no hepatosplenomegaly. There is no tenderness. There is no rebound, no guarding and no CVA tenderness.  Musculoskeletal: She exhibits no edema.  Skin: Skin is warm and dry. No rash noted.  Psychiatric: She has a normal mood and affect.      Assessment & Plan:  ADDENDUM ==> ER records reviewed: treatd with IVF, zofran and started on cipro. EKG WNL x some diffuse ST flattening WBC 9.0, Hgb 14.4, plt 146, glu 150, Cr 0.71, AST 46, ALT 34 UA - cloudy, spgr 1.029, 1+ blood, 30 prot, pos nitr, 1+ LE, micro: 3 RBC, 12 WBC, 3+ bact, 13 epi UCx - see above.

## 2012-08-02 ENCOUNTER — Telehealth: Payer: Self-pay

## 2012-08-02 MED ORDER — SULFAMETHOXAZOLE-TRIMETHOPRIM 800-160 MG PO TABS: 1.0000 | ORAL_TABLET | Freq: Two times a day (BID) | ORAL | Status: DC

## 2012-08-02 NOTE — Telephone Encounter (Signed)
Pt seen 07/27/12 with UTI; pt started Keflex; 2 days after starting Keflex pt began SOB; no chest pain or dizziness. H/A on and off and still has frequency of urine and pt cannot hold urine to get to restroom. Pt is on O2 round the clock at 4 L. Pt only has 2 Keflex left to take but pt does not want to take them due to SOB. [employer].Please advise.

## 2012-08-02 NOTE — Telephone Encounter (Signed)
stop keflex - added to allergy list. I've sent in bactrim to take instead for 10 days.  Update [place] if sxs persist after finishes abx course To come in if not improving after 2-3 days with this treatment.

## 2012-08-02 NOTE — Telephone Encounter (Signed)
Patient notified

## 2012-08-08 ENCOUNTER — Ambulatory Visit (INDEPENDENT_AMBULATORY_CARE_PROVIDER_SITE_OTHER)
Admission: RE | Admit: 2012-08-08 | Discharge: 2012-08-08 | Disposition: A | Discharge status: Home / Self Care | Payer: Medicare Other | Source: Ambulatory Visit | Attending: Family Medicine | Admitting: Family Medicine

## 2012-08-08 ENCOUNTER — Encounter: Payer: Self-pay | Admitting: Family Medicine

## 2012-08-08 ENCOUNTER — Ambulatory Visit (INDEPENDENT_AMBULATORY_CARE_PROVIDER_SITE_OTHER): Payer: Medicare Other | Admitting: Family Medicine

## 2012-08-08 VITALS — BP 138/80 | HR 62 | Temp 98.3°F

## 2012-08-08 DIAGNOSIS — M25539 Pain in unspecified wrist: Secondary | ICD-10-CM

## 2012-08-08 DIAGNOSIS — N390 Urinary tract infection, site not specified: Secondary | ICD-10-CM

## 2012-08-08 DIAGNOSIS — M25532 Pain in left wrist: Secondary | ICD-10-CM | POA: Insufficient documentation

## 2012-08-08 NOTE — Assessment & Plan Note (Signed)
L wrist tendonitis vs arthritis.  Check xrays today to eval for arthritic burden. Recommended continue arm brace, ice to wrist, may try tylenol. Avoid NSAIDs 2/2 h/o GERD/PUD.

## 2012-08-08 NOTE — Assessment & Plan Note (Signed)
Advised to ensure taking bactrim and encouraged finish full course. Then may restart doxy preventatively per urology Achilles Dunk)

## 2012-08-08 NOTE — Progress Notes (Signed)
  Subjective:    [name], female    DOB: 1941-04-24, 72 y.o.   MRN: [id number]  HPI CC: f/u  On doxy preventative UTI - stopped taking while on recent abx. Recent UTI - see last visit for details.  Placed on cipro by ER, UCx returned intermediate to this so changed to keflex which caused dypsnea so stopped and placed on bactrim.  Allergies updated. Unsure if she's currently taking bactrim, will check with pharmacist. Still with mild urgency.  H/o chronic cystitis  Respiratory rehab going well.  Restarted yesterday.  Having longstanding (>1 year) L wrist pain dorsal and ventral as well as some pain shooting down into fingers and swelling at wrist.  Pain intermittently shoots up into elbow.  Wears wrist brace at night time which helps.  Ice/immobility helps.  Has been told due to arthritis. Denies paresthesias/numbness of hands.  Has had steroid shot into R wrist years ago which helped similar issue. Denies injury/trauma to incite pain.  Currently no pain. Worse at night.  H/o GERD with HH and PUD.  Past Medical History  Diagnosis Date  . Diverticular disease 10/09/09    CT abd no sigmoid mass DDD L5/S1  . Peptic ulcer   . GERD (gastroesophageal reflux disease)   . Hiatal hernia   . Hypertension   . Dyslipidemia   . Chronic cystitis     recurrent, on suppressive doxy (uro Cope)  . Hematuria   . COPD, severe 01/27/07       . Lung cancer 1987  . Spinal abscess 2000    secondary to periodontal disease  . Osteoarthritis   . Depression with anxiety   . Osteopenia 08/2011    DEXA: L femur -1.6     Review of Systems Per HPI    Objective:   Physical Exam  Nursing note and vitals reviewed. Constitutional: She appears well-developed and well-nourished. No distress.  Pulse O2 by   Musculoskeletal: She exhibits no edema.  R wrist WNL L wrist tender to palpation mid wrist dorsally and ventrally. Max tenderness with resisted wrist flexion on left. Mild  tenderness with resisted wrist extension No edema, deformity noted  Neurological:  2+ rad pulses Sensation intact to light touch  Skin: Skin is warm and dry. No rash noted.  Psychiatric: She has a normal mood and affect.       Assessment & Plan:

## 2012-08-08 NOTE — Patient Instructions (Signed)
xrays of left wrist - we will call you with results. Treat with continued arm brace, ice, and try tylenol 500mg  as needed. Complete 10d course of bactrim, then may restart doxycycline.

## 2012-08-10 ENCOUNTER — Other Ambulatory Visit: Payer: Self-pay | Admitting: Family Medicine

## 2012-08-10 DIAGNOSIS — M25532 Pain in left wrist: Secondary | ICD-10-CM

## 2012-08-11 ENCOUNTER — Other Ambulatory Visit: Payer: Self-pay | Admitting: *Deleted

## 2012-08-11 MED ORDER — BUPROPION HCL ER (SR) 150 MG PO TB12: 150.0000 mg | ORAL_TABLET | Freq: Two times a day (BID) | ORAL | Status: DC

## 2012-08-14 ENCOUNTER — Other Ambulatory Visit: Payer: Self-pay | Admitting: Pulmonary Disease

## 2012-08-14 MED ORDER — ALBUTEROL SULFATE HFA 108 (90 BASE) MCG/ACT IN AERS: 2.0000 | INHALATION_SPRAY | RESPIRATORY_TRACT | Status: DC | PRN

## 2012-08-22 ENCOUNTER — Encounter: Payer: Self-pay | Admitting: Pulmonary Disease

## 2012-09-04 ENCOUNTER — Encounter: Payer: Self-pay | Admitting: Internal Medicine

## 2012-09-21 ENCOUNTER — Encounter: Payer: Self-pay | Admitting: Pulmonary Disease

## 2012-09-22 ENCOUNTER — Encounter: Payer: Self-pay | Admitting: Family Medicine

## 2012-09-22 ENCOUNTER — Ambulatory Visit (INDEPENDENT_AMBULATORY_CARE_PROVIDER_SITE_OTHER): Payer: Medicare Other | Admitting: Family Medicine

## 2012-09-22 VITALS — BP 158/80 | HR 72 | Temp 98.1°F | Wt 169.0 lb

## 2012-09-22 DIAGNOSIS — J438 Other emphysema: Secondary | ICD-10-CM

## 2012-09-22 DIAGNOSIS — J439 Emphysema, unspecified: Secondary | ICD-10-CM

## 2012-09-22 MED ORDER — LEVOFLOXACIN 500 MG PO TABS: 500.0000 mg | ORAL_TABLET | Freq: Every day | ORAL | Status: DC

## 2012-09-22 NOTE — Patient Instructions (Addendum)
Please take your levaquin as directed- 1 tablet daily x 10 days. Continue Mucinex.  Drink lots of fluids.   Try over the counter nasocort-start with 2 sprays per nostril per day...and then try to taper to 1 spray per nostril once symptoms improve.   You have an appointment with Dr. Kendrick Fries on May 6th at 11:30.

## 2012-09-22 NOTE — Progress Notes (Signed)
Subjective:    [name], female    DOB: Apr 14, 1941, 72 y.o.   MRN: [id number]  HPI Very pleasant 72 yo female pt of Dr. Reece Agar, new to me, with h/o GOLD 3 COPD and hypoxemia on chronic O2 and followed by Dr. Craige Cotta here for cough and congestion.  She is here with her husband today.  On pulmicort nebs, spiriva and as needed pro air nebs.  She has not increasing this with exacerbations.  Complains of increased SOB, prod cough with thick green/brown mucus, nasal congestion x 1 week. Last night, chest started to become tight.  Has had body aches and chills this morning.  Taking Mucinex.  Last CXR in Epic from 11/2011:   *RADIOLOGY REPORT*  Clinical Data: Cough and congestion  CHEST - 2 VIEW  Comparison: Chest radiograph 09/14/2007  Findings: Stable cardiac silhouette. There is vascular clips in  the right hilum unchanged. Lungs are hyperinflated. No effusion,  infiltrate, or pneumothorax.  IMPRESSION:  1. No acute cardiopulmonary process.  2. Emphysematous change.   Patient Active Problem List   Diagnosis Date Noted  . Left wrist pain 08/08/2012  . UTI (urinary tract infection) 07/27/2012  . Arm paresthesia, left 07/11/2012  . Chest discomfort 07/11/2012  . Chronic respiratory failure with hypoxia 04/27/2012  . Medicare annual wellness visit, initial 09/10/2011  . Dyslipidemia 09/01/2011  . Osteopenia 08/23/2011  . FATIGUE, CHRONIC 06/19/2008  . GOLD 4 COPD with emphysema 02/01/2008  . ANXIETY 11/21/2006  . DEPRESSION 11/21/2006  . HYPERTENSION 11/21/2006  . HYPERGLYCEMIA 11/21/2006   Past Medical History  Diagnosis Date  . Diverticular disease 10/09/09    CT abd no sigmoid mass DDD L5/S1  . Peptic ulcer   . GERD (gastroesophageal reflux disease)   . Hiatal hernia   . Hypertension   . Dyslipidemia   . Chronic cystitis     recurrent, on suppressive doxy (uro Cope)  . Hematuria   . COPD, severe 01/27/07       . Lung cancer 1987  . Spinal abscess 2000     secondary to periodontal disease  . Osteoarthritis   . Depression with anxiety   . Osteopenia 08/2011    DEXA: L femur -1.6  . Left scapholunate ligament tear 2014    found on imaging   Past Surgical History  Procedure Laterality Date  . Tubal ligation      bilateral  . Bladder surgery    . Breast biopsy  03/99    left  . Mouth surgery  2000    [facility] pneumonia sepsis due to gum surgery  . Cystoscopy  02/11/07 and 09/25/08    Dr. Achilles Dunk, normal  . Cholecystectomy  02/10/09  . Total abdominal hysterectomy  07/18/01    BSO, vag repair secondary prolapse  . Replacement total knee  09/2009    Right   History  Substance Use Topics  . Smoking status: Former Smoker -- 3.00 packs/day for 34 years    Types: Cigarettes    Quit date: 05/29/1995  . Smokeless tobacco: Never Used  . Alcohol Use: Yes     Comment: wine daily 2-3 glasses/day   Family History  Problem Relation Age of Onset  . Pancreatitis Mother   . Heart disease Father   . Hypertension Father   . Emphysema Father   . Arthritis Brother   . Hypertension Brother   . Cancer Maternal Aunt     ovarian cancer   Allergies  Allergen Reactions  .  Keflex (Cephalexin) Shortness Of Breath  . Penicillins     REACTION: hives   Current Outpatient Prescriptions on File Prior to Visit  Medication Sig Dispense Refill  . albuterol (PROAIR HFA) 108 (90 BASE) MCG/ACT inhaler Inhale 2 puffs into the lungs every 4 (four) hours as needed.  1 Inhaler  0  . albuterol (PROVENTIL) (2.5 MG/3ML) 0.083% nebulizer solution Take 3 mLs (2.5 mg total) by nebulization 4 (four) times daily.  75 mL  12  . budesonide (PULMICORT) 0.25 MG/2ML nebulizer solution Take 2 mLs (0.25 mg total) by nebulization daily.  60 mL  12  . buPROPion (WELLBUTRIN SR) 150 MG 12 hr tablet Take 1 tablet (150 mg total) by mouth 2 (two) times daily.  60 tablet  6  . cholecalciferol (VITAMIN D) 1000 UNITS tablet Take 1,000 Units by mouth daily.      .  diphenhydrAMINE (BENADRYL) 25 mg capsule Take 2 by mouth at bedtime as needed       . doxycycline (DORYX) 100 MG DR capsule Take 100 mg by mouth daily.      Marland Kitchen gabapentin (NEURONTIN) 100 MG capsule Take 1 capsule (100 mg total) by mouth at bedtime.  30 capsule  6  . Glucosamine-Chondroitin (OSTEO BI-FLEX REGULAR STRENGTH PO) Take 1 tablet by mouth 2 (two) times daily.      Marland Kitchen guaifenesin (HUMIBID E) 400 MG TABS Take by mouth. Take 1 and 1/2 two times a day as needed      . loratadine (CLARITIN) 10 MG tablet Take 10 mg by mouth daily.        Marland Kitchen losartan (COZAAR) 50 MG tablet Take 1 tablet (50 mg total) by mouth daily.  30 tablet  5  . metoprolol tartrate (LOPRESSOR) 25 MG tablet Take 1 tablet (25 mg total) by mouth 2 (two) times daily.  60 tablet  11  . omeprazole (PRILOSEC) 40 MG capsule Take 1 capsule (40 mg total) by mouth daily.  30 capsule  11  . pravastatin (PRAVACHOL) 80 MG tablet Take 1 tablet (80 mg total) by mouth at bedtime.  30 tablet  11  . psyllium (METAMUCIL) 58.6 % powder Take 1 packet by mouth daily.      . sertraline (ZOLOFT) 100 MG tablet Take 1 tablet (100 mg total) by mouth daily.  30 tablet  11  . sulfamethoxazole-trimethoprim (BACTRIM DS,SEPTRA DS) 800-160 MG per tablet Take 1 tablet by mouth 2 (two) times daily.  20 tablet  0  . tiotropium (SPIRIVA) 18 MCG inhalation capsule Place 1 capsule (18 mcg total) into inhaler and inhale daily.  30 capsule  5   No current facility-administered medications on file prior to visit.   The PMH, PSH, Social History, Family History, Medications, and allergies have been reviewed in [facility], and have been updated if relevant.   ROS: See HPI No fevers No n/v/d    Objective:  BP 158/80  Pulse 72  Temp(Src) 98.1 F (36.7 C) (Oral)  Wt 169 lb (76.658 kg)  BMI 28.99 kg/m2  SpO2 96%  GEN: Alert, pleasant female in NAD, O2 per [place], no increased WOB  HEENT:  , TMs-wnl, NOSE-clear, THROAT-clear, no lesions, +PND   NECK:  Supple w/ fair ROM;  no JVD; normal carotid impulses w/o bruits; no thyromegaly or nodules palpated; no lymphadenopathy.  RESP: No increased WOB Decreased BS bilaterally with scattered exp wheezes  CVS:  RRR, no m/r/g  , no peripheral edema, pulses intact, no cyanosis or clubbing.  GI:  Soft & nt; nml bowel sounds; no organomegaly or masses detected.  Musco: Warm bil, no deformities or joint swelling noted.   Neuro: alert, no focal deficits noted.    Skin: Warm, no lesions or rashes         Assessment & Plan:  1. GOLD 4 COPD with emphysema Deteriorated with exacerbation.  Levaquin 500 mg daily x10 days.  Mucinex DM twice daily as needed. For cough and congestion.  Fluids and rest.  OTC nasocort-start with 2 sprays per nostril per day...and then try to taper to 1 spray per nostril once symptoms improve for nasal congestion.   Keep appt with Dr. Kendrick Fries for next Tuesday. Call or return to clinic prn if these symptoms worsen or fail to improve as anticipated. The patient indicates understanding of these issues and agrees with the plan.

## 2012-09-26 ENCOUNTER — Ambulatory Visit (INDEPENDENT_AMBULATORY_CARE_PROVIDER_SITE_OTHER): Payer: Medicare Other | Admitting: Pulmonary Disease

## 2012-09-26 ENCOUNTER — Encounter: Payer: Self-pay | Admitting: Pulmonary Disease

## 2012-09-26 VITALS — BP 120/82 | HR 67 | Temp 98.0°F | Ht 64.0 in | Wt 170.1 lb

## 2012-09-26 DIAGNOSIS — J9611 Chronic respiratory failure with hypoxia: Secondary | ICD-10-CM

## 2012-09-26 DIAGNOSIS — R0902 Hypoxemia: Secondary | ICD-10-CM

## 2012-09-26 DIAGNOSIS — J9610 Chronic respiratory failure, unspecified whether with hypoxia or hypercapnia: Secondary | ICD-10-CM

## 2012-09-26 DIAGNOSIS — J439 Emphysema, unspecified: Secondary | ICD-10-CM

## 2012-09-26 DIAGNOSIS — J438 Other emphysema: Secondary | ICD-10-CM

## 2012-09-26 NOTE — Patient Instructions (Signed)
Keep using your medications, oxygen as you are doing Keep going to pulmonary rehab.   We will see you back in 6 months or sooner if needed

## 2012-09-26 NOTE — Assessment & Plan Note (Signed)
Despite an episode of bronchitis one week ago, this is been a stable interval for [name] [name].  She says that her shortness of breath is currently well-controlled on the Spiriva and Pulmicort.  Plan: -Continue Spiriva -Continue Pulmicort -Continue when necessary albuterol -Continue pulmonary rehabilitation

## 2012-09-26 NOTE — Assessment & Plan Note (Signed)
Continue 4 L of oxygen with exertion, 3 L at rest. I gave her a prescription for a portable concentrator.

## 2012-09-26 NOTE — Progress Notes (Signed)
Subjective:    [name], female    DOB: [birth year], 72 y.o.   MRN: [id number]  Synopsis: [place] has gold stage IV COPD and has followed with Dr. Craige Cotta in [place] since around 2007. She uses 4 L of oxygen with exertion and 3 L at rest. She participates in pulmonary rehabilitation at [facility]. Because she lives in [place] she switched her care to Dr. Kendrick Fries in may of 2014.  HPI  09/26/2012 ROV>> Nakeyia says that one week ago she had sinus congestion and mucus production. She saw Dr. Dayton Martes who gave her a prescription for Levaquin and recommended that she start using over-the-counter Nasacort. She says that the mucus production has cleared up and she has less frequent cough. Her sinus congestion has improved greatly. Aside from that episode, she states that her shortness of breath has been doing quite well on her current regimen which includes Spiriva and Pulmicort. She continues to use 4 L of oxygen with exertion and 3 L at rest and benefits from this. She states that participation in pulmonary rehabilitation has improved her ability to exercise. She is now able to do activities in her house (vacuum, cleaning curtains) and climb flights of stairs but she did not know that she could do.   Past Medical History  Diagnosis Date  . Diverticular disease 10/09/09    CT abd no sigmoid mass DDD L5/S1  . Peptic ulcer   . GERD (gastroesophageal reflux disease)   . Hiatal hernia   . Hypertension   . Dyslipidemia   . Chronic cystitis     recurrent, on suppressive doxy (uro Cope)  . Hematuria   . COPD, severe 01/27/07       . Lung cancer 1987  . Spinal abscess 2000    secondary to periodontal disease  . Osteoarthritis   . Depression with anxiety   . Osteopenia 08/2011    DEXA: L femur -1.6  . Left scapholunate ligament tear 2014    found on imaging     Family History  Problem Relation Age of Onset  . Pancreatitis Mother   . Heart disease  Father   . Hypertension Father   . Emphysema Father   . Arthritis Brother   . Hypertension Brother   . Cancer Maternal Aunt     ovarian cancer     History   Social History  . Marital Status: Married    Spouse Name: N/A    Number of Children: 2  . Years of Education: N/A   Occupational History  . Retired     prior Print production planner at [employer]   Social History Main Topics  . Smoking status: Former Smoker -- 3.00 packs/day for 34 years    Types: Cigarettes    Quit date: 05/29/1995  . Smokeless tobacco: Never Used  . Alcohol Use: Yes     Comment: wine daily 2-3 glasses/day  . Drug Use: No  . Sexually Active: Not on file   Other Topics Concern  . Not on file   Social History Narrative   Remarried for the third time, 2nd husband died of heart attack. Two stepsons   Activity: Walks for exercise     Allergies  Allergen Reactions  . Keflex (Cephalexin) Shortness Of Breath  . Penicillins     REACTION: hives     Outpatient Prescriptions Prior to Visit  Medication Sig Dispense Refill  . albuterol (PROAIR HFA) 108 (90 BASE) MCG/ACT inhaler Inhale  2 puffs into the lungs every 4 (four) hours as needed.  1 Inhaler  0  . albuterol (PROVENTIL) (2.5 MG/3ML) 0.083% nebulizer solution Take 3 mLs (2.5 mg total) by nebulization 4 (four) times daily.  75 mL  12  . budesonide (PULMICORT) 0.25 MG/2ML nebulizer solution Take 2 mLs (0.25 mg total) by nebulization daily.  60 mL  12  . buPROPion (WELLBUTRIN SR) 150 MG 12 hr tablet Take 1 tablet (150 mg total) by mouth 2 (two) times daily.  60 tablet  6  . cholecalciferol (VITAMIN D) 1000 UNITS tablet Take 1,000 Units by mouth daily.      . diphenhydrAMINE (BENADRYL) 25 mg capsule Take 2 by mouth at bedtime as needed       . guaifenesin (HUMIBID E) 400 MG TABS Take by mouth. Take 1 and 1/2 two times a day as needed      . loratadine (CLARITIN) 10 MG tablet Take 10 mg by mouth daily.        Marland Kitchen losartan (COZAAR) 50 MG tablet Take 1  tablet (50 mg total) by mouth daily.  30 tablet  5  . metoprolol tartrate (LOPRESSOR) 25 MG tablet Take 1 tablet (25 mg total) by mouth 2 (two) times daily.  60 tablet  11  . omeprazole (PRILOSEC) 40 MG capsule Take 1 capsule (40 mg total) by mouth daily.  30 capsule  11  . pravastatin (PRAVACHOL) 80 MG tablet Take 1 tablet (80 mg total) by mouth at bedtime.  30 tablet  11  . psyllium (METAMUCIL) 58.6 % powder Take 1 packet by mouth daily.      . sertraline (ZOLOFT) 100 MG tablet Take 1 tablet (100 mg total) by mouth daily.  30 tablet  11  . tiotropium (SPIRIVA) 18 MCG inhalation capsule Place 1 capsule (18 mcg total) into inhaler and inhale daily.  30 capsule  5  . gabapentin (NEURONTIN) 100 MG capsule Take 1 capsule (100 mg total) by mouth at bedtime.  30 capsule  6  . levofloxacin (LEVAQUIN) 500 MG tablet Take 1 tablet (500 mg total) by mouth daily.  10 tablet  0   No facility-administered medications prior to visit.      Review of Systems  Constitutional: Negative for fever, chills and unexpected weight change.  HENT: Positive for sore throat and postnasal drip. Negative for ear pain, nosebleeds, congestion, rhinorrhea, sneezing, trouble swallowing, dental problem, voice change and sinus pressure.   Eyes: Negative for visual disturbance.  Respiratory: Positive for cough and shortness of breath. Negative for choking.   Cardiovascular: Negative for chest pain and leg swelling.  Gastrointestinal: Negative for vomiting, abdominal pain and diarrhea.  Genitourinary: Negative for difficulty urinating.  Musculoskeletal: Negative for arthralgias.  Skin: Negative for rash.  Neurological: Negative for tremors, syncope and headaches.  Hematological: Does not bruise/bleed easily.       Objective:   Physical Exam Filed Vitals:   09/26/12 1134  BP: 120/82  Pulse: 67  Temp: 98 F (36.7 C)  TempSrc: Oral  Height: 5\' 4"  (1.626 m)  Weight: 170 lb 1.9 oz (77.166 kg)  SpO2: 97%  On 3 L  [place]  Gen: well appearing, no acute distress HEENT: NCAT, PERRL, EOMi, OP clear, neck supple without masses PULM: Few wheezes RUL, otherwise clear CV: RRR, no mgr, no JVD AB: BS+, soft, nontender, no hsm Ext: warm, no edema, no clubbing, no cyanosis Derm: no rash or skin breakdown Neuro: A&Ox4, CN II-XII intact, strength 5/5 in  all 4 extremities      Assessment & Plan:   GOLD 4 COPD with emphysema Despite an episode of bronchitis one week ago, this is been a stable interval for Mrs. Coxe.  She says that her shortness of breath is currently well-controlled on the Spiriva and Pulmicort.  Plan: -Continue Spiriva -Continue Pulmicort -Continue when necessary albuterol -Continue pulmonary rehabilitation  Chronic respiratory failure with hypoxia Continue 4 L of oxygen with exertion, 3 L at rest. I gave her a prescription for a portable concentrator.   Updated Medication List Outpatient Encounter Prescriptions as of 09/26/2012  Medication Sig Dispense Refill  . albuterol (PROAIR HFA) 108 (90 BASE) MCG/ACT inhaler Inhale 2 puffs into the lungs every 4 (four) hours as needed.  1 Inhaler  0  . albuterol (PROVENTIL) (2.5 MG/3ML) 0.083% nebulizer solution Take 3 mLs (2.5 mg total) by nebulization 4 (four) times daily.  75 mL  12  . budesonide (PULMICORT) 0.25 MG/2ML nebulizer solution Take 2 mLs (0.25 mg total) by nebulization daily.  60 mL  12  . buPROPion (WELLBUTRIN SR) 150 MG 12 hr tablet Take 1 tablet (150 mg total) by mouth 2 (two) times daily.  60 tablet  6  . cholecalciferol (VITAMIN D) 1000 UNITS tablet Take 1,000 Units by mouth daily.      . diphenhydrAMINE (BENADRYL) 25 mg capsule Take 2 by mouth at bedtime as needed       . guaifenesin (HUMIBID E) 400 MG TABS Take by mouth. Take 1 and 1/2 two times a day as needed      . levofloxacin (LEVAQUIN) 500 MG tablet Take 500 mg by mouth daily.      Marland Kitchen loratadine (CLARITIN) 10 MG tablet Take 10 mg by mouth daily.        Marland Kitchen losartan  (COZAAR) 50 MG tablet Take 1 tablet (50 mg total) by mouth daily.  30 tablet  5  . metoprolol tartrate (LOPRESSOR) 25 MG tablet Take 1 tablet (25 mg total) by mouth 2 (two) times daily.  60 tablet  11  . omeprazole (PRILOSEC) 40 MG capsule Take 1 capsule (40 mg total) by mouth daily.  30 capsule  11  . pravastatin (PRAVACHOL) 80 MG tablet Take 1 tablet (80 mg total) by mouth at bedtime.  30 tablet  11  . psyllium (METAMUCIL) 58.6 % powder Take 1 packet by mouth daily.      . sertraline (ZOLOFT) 100 MG tablet Take 1 tablet (100 mg total) by mouth daily.  30 tablet  11  . tiotropium (SPIRIVA) 18 MCG inhalation capsule Place 1 capsule (18 mcg total) into inhaler and inhale daily.  30 capsule  5  . [DISCONTINUED] gabapentin (NEURONTIN) 100 MG capsule Take 1 capsule (100 mg total) by mouth at bedtime.  30 capsule  6  . [DISCONTINUED] levofloxacin (LEVAQUIN) 500 MG tablet Take 1 tablet (500 mg total) by mouth daily.  10 tablet  0   No facility-administered encounter medications on file as of 09/26/2012.

## 2012-10-09 ENCOUNTER — Other Ambulatory Visit: Payer: Self-pay | Admitting: *Deleted

## 2012-10-09 MED ORDER — SERTRALINE HCL 100 MG PO TABS: 100.0000 mg | ORAL_TABLET | Freq: Every day | ORAL | Status: DC

## 2012-10-17 ENCOUNTER — Encounter: Payer: Self-pay | Admitting: Pulmonary Disease

## 2012-10-22 ENCOUNTER — Encounter: Payer: Self-pay | Admitting: Pulmonary Disease

## 2012-11-08 ENCOUNTER — Other Ambulatory Visit: Payer: Self-pay | Admitting: *Deleted

## 2012-11-08 MED ORDER — LOSARTAN POTASSIUM 50 MG PO TABS: 50.0000 mg | ORAL_TABLET | Freq: Every day | ORAL | Status: DC

## 2012-11-13 ENCOUNTER — Ambulatory Visit (INDEPENDENT_AMBULATORY_CARE_PROVIDER_SITE_OTHER): Payer: Medicare Other | Admitting: Family Medicine

## 2012-11-13 ENCOUNTER — Encounter: Payer: Self-pay | Admitting: Family Medicine

## 2012-11-13 VITALS — BP 128/82 | HR 68 | Temp 98.2°F | Ht 64.0 in | Wt 167.5 lb

## 2012-11-13 DIAGNOSIS — Z23 Encounter for immunization: Secondary | ICD-10-CM

## 2012-11-13 DIAGNOSIS — J9610 Chronic respiratory failure, unspecified whether with hypoxia or hypercapnia: Secondary | ICD-10-CM

## 2012-11-13 DIAGNOSIS — F329 Major depressive disorder, single episode, unspecified: Secondary | ICD-10-CM

## 2012-11-13 DIAGNOSIS — M8580 Other specified disorders of bone density and structure, unspecified site: Secondary | ICD-10-CM

## 2012-11-13 DIAGNOSIS — J9611 Chronic respiratory failure with hypoxia: Secondary | ICD-10-CM

## 2012-11-13 DIAGNOSIS — Z2911 Encounter for prophylactic immunotherapy for respiratory syncytial virus (RSV): Secondary | ICD-10-CM

## 2012-11-13 DIAGNOSIS — E785 Hyperlipidemia, unspecified: Secondary | ICD-10-CM

## 2012-11-13 DIAGNOSIS — Z0000 Encounter for general adult medical examination without abnormal findings: Secondary | ICD-10-CM

## 2012-11-13 DIAGNOSIS — R7989 Other specified abnormal findings of blood chemistry: Secondary | ICD-10-CM

## 2012-11-13 DIAGNOSIS — I10 Essential (primary) hypertension: Secondary | ICD-10-CM

## 2012-11-13 DIAGNOSIS — M949 Disorder of cartilage, unspecified: Secondary | ICD-10-CM

## 2012-11-13 DIAGNOSIS — N644 Mastodynia: Secondary | ICD-10-CM

## 2012-11-13 DIAGNOSIS — R0902 Hypoxemia: Secondary | ICD-10-CM

## 2012-11-13 LAB — COMPREHENSIVE METABOLIC PANEL
ALT: 31 U/L (ref 0–35)
Albumin: 4.3 g/dL (ref 3.5–5.2)
CO2: 31 mEq/L (ref 19–32)
Calcium: 9.5 mg/dL (ref 8.4–10.5)
Chloride: 101 mEq/L (ref 96–112)
Creatinine, Ser: 0.9 mg/dL (ref 0.4–1.2)
GFR: 65.48 mL/min (ref >60.00)
Potassium: 4.4 mEq/L (ref 3.5–5.1)
Total Protein: 7.3 g/dL (ref 6.0–8.3)

## 2012-11-13 LAB — LIPID PANEL
Total CHOL/HDL Ratio: 3
Triglycerides: 175 mg/dL — ABNORMAL HIGH (ref 0.0–149.0)

## 2012-11-13 MED ORDER — SERTRALINE HCL 100 MG PO TABS: ORAL_TABLET | ORAL | Status: DC

## 2012-11-13 NOTE — Assessment & Plan Note (Signed)
I have personally reviewed the Medicare Annual Wellness questionnaire and have noted 1. The patient's medical and social history 2. Their use of alcohol, tobacco or illicit drugs 3. Their current medications and supplements 4. The patient's functional ability including ADL's, fall risks, home safety risks and hearing or visual impairment. 5. Diet and physical activity 6. Evidence for depression or mood disorders The patients weight, height, BMI have been recorded in the chart.  Hearing and vision has been addressed. I have made referrals, counseling and provided education to the patient based review of the above and I have provided the pt with a written personalized care plan for preventive services. See scanned questionairre. Advanced directives : would want husband to be HCPOA.  Reviewed preventative protocols and updated unless pt declined. zostavax today - pt states checked with insurance re coverage. Breast exam today. DEXA 08/2011 - osteopenia

## 2012-11-13 NOTE — Assessment & Plan Note (Signed)
Stable

## 2012-11-13 NOTE — Patient Instructions (Addendum)
Call Dr. Lamar Sprinkles office to see if you're due repeat colonoscopy (last done 09/2009). Pass by Marion's office to schedule diagnostic mammogram. Shingles shot today. Let's increase zoloft to 1 pill in am and 1/2 pill in evenings. Good to see you today, call us with questions. Return as needed, in 1-2 months if not improving.

## 2012-11-13 NOTE — Addendum Note (Signed)
Addended by: Shon Millet on: 11/13/2012 12:44 PM   Modules accepted: Orders

## 2012-11-13 NOTE — Assessment & Plan Note (Signed)
Stable last year.  Reviewed cal/vit D rec daily intake

## 2012-11-13 NOTE — Assessment & Plan Note (Signed)
Continued bouts of depression/anxiety - will increase zoloft to 100mg  in am and 50mg  at night time. Return if sxs persist for further discussion PHQ9 = 5 today GAD7 = 5 today

## 2012-11-13 NOTE — Progress Notes (Signed)
Subjective:    [name], female    DOB: [birth year], 72 y.o.   MRN: [id number]  HPI CC: medicare wellness visit  No falls in last year Depression screening returned positive - on wellbutrin 150mg  bid with zoloft 100mg  daily.  Having worsening mood for the last year - has started after shes had more malaise.  Some family stress - with bipolar step-son.    Hearing screen passed. Vision screen - failed L eye.  Cataract surgery 09/2011.  Saw in f/u 09/2012.  Preventative:  Last CPE last year.  Colonoscopy - 09/2009 with 3 polyps, rec rpt 3 yrs. (Dr. Marina Goodell)  Well woman - has stopped. Mammogram - in 12/2010. Always normal.  Dexa - last done a long time ago. Interested in rpt. Does drink good amt milk, discussed recommended amt. Mother with h/o osteoporosis.  Flu 2012  Td 2010  Pneumonia 2010  Shingles - would like today. advanced directed - would want HCPOA to be husband, [name] [name]. Does have living will but not updated.  Caffeine: 4 cups coffee Remarried for the third time, 2nd husband died of heart attack. Two stepsons Activity: Walks for exercise Diet: good water, fruits/vegetables daily  Medications and allergies reviewed and updated in chart.  Past histories reviewed and updated if relevant as below. Patient Active Problem List   Diagnosis Date Noted  . Left wrist pain 08/08/2012  . UTI (urinary tract infection) 07/27/2012  . Arm paresthesia, left 07/11/2012  . Chest discomfort 07/11/2012  . Chronic respiratory failure with hypoxia 04/27/2012  . Medicare annual wellness visit, initial 09/10/2011  . Dyslipidemia 09/01/2011  . Osteopenia 08/23/2011  . FATIGUE, CHRONIC 06/19/2008  . GOLD 4 COPD with emphysema 02/01/2008  . ANXIETY 11/21/2006  . DEPRESSION 11/21/2006  . HYPERTENSION 11/21/2006  . HYPERGLYCEMIA 11/21/2006   Past Medical History  Diagnosis Date  . Diverticular disease 10/09/09    CT abd no sigmoid mass DDD L5/S1  . Peptic ulcer   . GERD  (gastroesophageal reflux disease)   . Hiatal hernia   . Hypertension   . Dyslipidemia   . Chronic cystitis     recurrent, on suppressive doxy (uro Cope)  . Hematuria   . COPD, severe 01/27/07       . Lung cancer 1987  . Spinal abscess 2000    secondary to periodontal disease  . Osteoarthritis   . Depression with anxiety   . Osteopenia 08/2011    DEXA: L femur -1.6  . Left scapholunate ligament tear 2014    found on imaging   Past Surgical History  Procedure Laterality Date  . Tubal ligation      bilateral  . Bladder surgery    . Breast biopsy  03/99    left  . Mouth surgery  2000    [facility] pneumonia sepsis due to gum surgery  . Cystoscopy  02/11/07 and 09/25/08    Dr. Achilles Dunk, normal  . Cholecystectomy  02/10/09  . Total abdominal hysterectomy  07/18/01    benign reason, BSO, vag repair secondary prolapse  . Replacement total knee  09/2009    Right   History  Substance Use Topics  . Smoking status: Former Smoker -- 3.00 packs/day for 34 years    Types: Cigarettes    Quit date: 05/29/1995  . Smokeless tobacco: Never Used  . Alcohol Use: Yes     Comment: wine daily 2-3 glasses/day   Family History  Problem Relation Age of Onset  . Pancreatitis  Mother   . Heart disease Father   . Hypertension Father   . Emphysema Father   . Arthritis Brother   . Hypertension Brother   . Cancer Maternal Aunt     ovarian cancer   Allergies  Allergen Reactions  . Keflex (Cephalexin) Shortness Of Breath  . Penicillins     REACTION: hives   Current Outpatient Prescriptions on File Prior to Visit  Medication Sig Dispense Refill  . albuterol (PROAIR HFA) 108 (90 BASE) MCG/ACT inhaler Inhale 2 puffs into the lungs every 4 (four) hours as needed.  1 Inhaler  0  . albuterol (PROVENTIL) (2.5 MG/3ML) 0.083% nebulizer solution Take 3 mLs (2.5 mg total) by nebulization 4 (four) times daily.  75 mL  12  . budesonide (PULMICORT) 0.25 MG/2ML nebulizer solution Take 2 mLs (0.25 mg  total) by nebulization daily.  60 mL  12  . buPROPion (WELLBUTRIN SR) 150 MG 12 hr tablet Take 1 tablet (150 mg total) by mouth 2 (two) times daily.  60 tablet  6  . cholecalciferol (VITAMIN D) 1000 UNITS tablet Take 1,000 Units by mouth daily.      . diphenhydrAMINE (BENADRYL) 25 mg capsule Take 2 by mouth at bedtime as needed       . guaifenesin (HUMIBID E) 400 MG TABS Take by mouth. Take 1 and 1/2 two times a day as needed      . loratadine (CLARITIN) 10 MG tablet Take 10 mg by mouth daily.        Marland Kitchen losartan (COZAAR) 50 MG tablet Take 1 tablet (50 mg total) by mouth daily. * Needs to schedule a physical with lab work prior for refills*  30 tablet  0  . metoprolol tartrate (LOPRESSOR) 25 MG tablet Take 1 tablet (25 mg total) by mouth 2 (two) times daily.  60 tablet  11  . omeprazole (PRILOSEC) 40 MG capsule Take 1 capsule (40 mg total) by mouth daily.  30 capsule  11  . pravastatin (PRAVACHOL) 80 MG tablet Take 1 tablet (80 mg total) by mouth at bedtime.  30 tablet  11  . psyllium (METAMUCIL) 58.6 % powder Take 1 packet by mouth daily.      . sertraline (ZOLOFT) 100 MG tablet Take 1 tablet (100 mg total) by mouth daily. * Needs appointment with Dr for additional refills*  30 tablet  3  . tiotropium (SPIRIVA) 18 MCG inhalation capsule Place 1 capsule (18 mcg total) into inhaler and inhale daily.  30 capsule  5   No current facility-administered medications on file prior to visit.     Review of Systems  Constitutional: Negative for fever, chills, activity change, appetite change, fatigue and unexpected weight change.  HENT: Negative for hearing loss and neck pain.   Eyes: Negative for visual disturbance.  Respiratory: Positive for cough and shortness of breath. Negative for chest tightness and wheezing.   Cardiovascular: Negative for chest pain, palpitations and leg swelling.  Gastrointestinal: Positive for constipation. Negative for nausea, vomiting, abdominal pain, diarrhea, blood in stool  and abdominal distention.  Genitourinary: Negative for hematuria and difficulty urinating.  Musculoskeletal: Negative for myalgias and arthralgias.  Skin: Negative for rash.  Neurological: Negative for dizziness, seizures, syncope and headaches.  Hematological: Negative for adenopathy. Bruises/bleeds easily.  Psychiatric/Behavioral: Positive for dysphoric mood. The patient is not nervous/anxious.        Objective:   Physical Exam  Nursing note and vitals reviewed. Constitutional: She is oriented to person, place, and time.  She appears well-developed and well-nourished. No distress.  Wearing 4L by [place]  HENT:  Head: Normocephalic and atraumatic.  Right Ear: Hearing, tympanic membrane, external ear and ear canal normal.  Left Ear: Hearing, tympanic membrane, external ear and ear canal normal.  Nose: Nose normal.  Mouth/Throat: Oropharynx is clear and moist. No oropharyngeal exudate.  Eyes: Conjunctivae and EOM are normal. Pupils are equal, round, and reactive to light. No scleral icterus.  Neck: Normal range of motion. Neck supple. Carotid bruit is not present. No thyromegaly present.  Cardiovascular: Normal rate, regular rhythm, normal heart sounds and intact distal pulses.   No murmur heard. Pulses:      Radial pulses are 2+ on the right side, and 2+ on the left side.  Pulmonary/Chest: Effort normal and breath sounds normal. No respiratory distress. She has no wheezes. She has no rales. Right breast exhibits no inverted nipple, no mass, no nipple discharge, no skin change and no tenderness. Left breast exhibits no inverted nipple, no mass, no nipple discharge, no skin change and no tenderness.  Abdominal: Soft. Bowel sounds are normal. She exhibits no distension and no mass. There is no tenderness. There is no rebound and no guarding.  Musculoskeletal: Normal range of motion. She exhibits no edema.  Lymphadenopathy:    She has no cervical adenopathy.    She has no axillary adenopathy.        Right axillary: No lateral adenopathy present.       Left axillary: No lateral adenopathy present.      Right: No supraclavicular adenopathy present.       Left: No supraclavicular adenopathy present.  Neurological: She is alert and oriented to person, place, and time.  CN grossly intact, station and gait intact  Skin: Skin is warm and dry. No rash noted.  Psychiatric: She has a normal mood and affect. Her behavior is normal. Judgment and thought content normal.      Assessment & Plan:

## 2012-11-13 NOTE — Assessment & Plan Note (Signed)
With lump in history of fibrocystic breast disease - check diagnostic mammo ( as due and 2 years since last mammo).

## 2012-11-14 ENCOUNTER — Encounter: Payer: Self-pay | Admitting: Family Medicine

## 2012-11-16 ENCOUNTER — Ambulatory Visit: Payer: Self-pay | Admitting: Family Medicine

## 2012-11-28 ENCOUNTER — Encounter: Payer: Self-pay | Admitting: Family Medicine

## 2012-12-01 ENCOUNTER — Encounter: Payer: Self-pay | Admitting: Family Medicine

## 2012-12-01 ENCOUNTER — Ambulatory Visit (INDEPENDENT_AMBULATORY_CARE_PROVIDER_SITE_OTHER): Payer: Self-pay | Admitting: Family Medicine

## 2012-12-01 VITALS — BP 126/74 | HR 71 | Temp 97.7°F | Wt 168.0 lb

## 2012-12-01 DIAGNOSIS — R300 Dysuria: Secondary | ICD-10-CM

## 2012-12-01 DIAGNOSIS — N390 Urinary tract infection, site not specified: Secondary | ICD-10-CM

## 2012-12-01 LAB — POCT URINALYSIS DIPSTICK
Nitrite, UA: NEGATIVE
Protein, UA: NEGATIVE
Urobilinogen, UA: NEGATIVE
pH, UA: 6

## 2012-12-01 MED ORDER — SULFAMETHOXAZOLE-TMP DS 800-160 MG PO TABS: 1.0000 | ORAL_TABLET | Freq: Two times a day (BID) | ORAL | Status: DC

## 2012-12-01 NOTE — Patient Instructions (Signed)
Take bactrim twice daily for 7 days - take all 7 day course. Please let me know if not improving as expected. Urine culture sent again today.  Urinary Tract Infection Urinary tract infections (UTIs) can develop anywhere along your urinary tract. Your urinary tract is your body's drainage system for removing wastes and extra water. Your urinary tract includes two kidneys, two ureters, a bladder, and a urethra. Your kidneys are a pair of bean-shaped organs. Each kidney is about the size of your fist. They are located below your ribs, one on each side of your spine. CAUSES Infections are caused by microbes, which are microscopic organisms, including fungi, viruses, and bacteria. These organisms are so small that they can only be seen through a microscope. Bacteria are the microbes that most commonly cause UTIs. SYMPTOMS  Symptoms of UTIs may vary by age and gender of the patient and by the location of the infection. Symptoms in young women typically include a frequent and intense urge to urinate and a painful, burning feeling in the bladder or urethra during urination. Older women and men are more likely to be tired, shaky, and weak and have muscle aches and abdominal pain. A fever may mean the infection is in your kidneys. Other symptoms of a kidney infection include pain in your back or sides below the ribs, nausea, and vomiting. DIAGNOSIS To diagnose a UTI, your caregiver will ask you about your symptoms. Your caregiver also will ask to provide a urine sample. The urine sample will be tested for bacteria and white blood cells. White blood cells are made by your body to help fight infection. TREATMENT  Typically, UTIs can be treated with medication. Because most UTIs are caused by a bacterial infection, they usually can be treated with the use of antibiotics. The choice of antibiotic and length of treatment depend on your symptoms and the type of bacteria causing your infection. HOME CARE  INSTRUCTIONS  If you were prescribed antibiotics, take them exactly as your caregiver instructs you. Finish the medication even if you feel better after you have only taken some of the medication.  Drink enough water and fluids to keep your urine clear or pale yellow.  Avoid caffeine, tea, and carbonated beverages. They tend to irritate your bladder.  Empty your bladder often. Avoid holding urine for long periods of time.  Empty your bladder before and after sexual intercourse.  After a bowel movement, women should cleanse from front to back. Use each tissue only once. SEEK MEDICAL CARE IF:   You have back pain.  You develop a fever.  Your symptoms do not begin to resolve within 3 days. SEEK IMMEDIATE MEDICAL CARE IF:   You have severe back pain or lower abdominal pain.  You develop chills.  You have nausea or vomiting.  You have continued burning or discomfort with urination. MAKE SURE YOU:   Understand these instructions.  Will watch your condition.  Will get help right away if you are not doing well or get worse. Document Released: 02/17/2005 Document Revised: 11/09/2011 Document Reviewed: 06/18/2011 [facility] Patient Information 2014 [place], [place].

## 2012-12-01 NOTE — Assessment & Plan Note (Signed)
Rpt UTI - in h/o Klebsiella intermediate resistance to cipro. Will repeat bactrim course - treat x 10 days and send culture again today. Lab Results  Component Value Date   CREATININE 0.9 11/13/2012

## 2012-12-01 NOTE — Progress Notes (Signed)
  Subjective:    [name], female    DOB: 06-29-1940, 72 y.o.   MRN: [id number]  HPI CC: ?UTI  3wk h/o lower back pain and dysuria.  Dysuria has decreased.  + urgency, frequency.  + chills. No hematuria, fevers, nausea/vomiting.  Last UTI was 07/2012, treated with keflex then changed to bactrim 2/2 SOB reaction.  Thinks has taken some abx since started but unsure what this was.  States she had refills of bactrim (although I never sent refills in).  Prior UTI: >100k Klebsiella pneumonia intermediate to cipro levo nitro, resistant to ampicillin, sensitive to cefazolin, ceftriaxone, imipenem, bactrim.  Past Medical History  Diagnosis Date  . Diverticular disease 10/09/09    CT abd no sigmoid mass DDD L5/S1  . Peptic ulcer   . GERD (gastroesophageal reflux disease)   . Hiatal hernia   . Hypertension   . Dyslipidemia   . Chronic cystitis     recurrent, on suppressive doxy (uro Cope)  . Hematuria   . COPD, severe 01/27/07       . Lung cancer 1987  . Spinal abscess 2000    secondary to periodontal disease  . Osteoarthritis   . Depression with anxiety   . Osteopenia 08/2011    DEXA: L femur -1.6  . Left scapholunate ligament tear 2014    found on imaging     Review of Systems Per HPI    Objective:   Physical Exam  Nursing note and vitals reviewed. Constitutional: She appears well-developed and well-nourished. No distress.  Abdominal: Soft. Bowel sounds are normal. She exhibits no distension and no mass. There is no tenderness. There is no rigidity, no rebound, no guarding, no CVA tenderness and negative Murphy's sign.       Assessment & Plan:

## 2012-12-04 ENCOUNTER — Other Ambulatory Visit: Payer: Self-pay | Admitting: Family Medicine

## 2012-12-04 MED ORDER — CIPROFLOXACIN HCL 500 MG PO TABS: 500.0000 mg | ORAL_TABLET | Freq: Two times a day (BID) | ORAL | Status: DC

## 2012-12-12 ENCOUNTER — Other Ambulatory Visit: Payer: Self-pay | Admitting: *Deleted

## 2012-12-12 MED ORDER — LOSARTAN POTASSIUM 50 MG PO TABS: 50.0000 mg | ORAL_TABLET | Freq: Every day | ORAL | Status: DC

## 2013-01-10 ENCOUNTER — Other Ambulatory Visit: Payer: Self-pay | Admitting: *Deleted

## 2013-01-10 MED ORDER — SERTRALINE HCL 100 MG PO TABS: ORAL_TABLET | ORAL | Status: DC

## 2013-01-18 ENCOUNTER — Ambulatory Visit (INDEPENDENT_AMBULATORY_CARE_PROVIDER_SITE_OTHER): Payer: Medicare Other | Admitting: Internal Medicine

## 2013-01-18 ENCOUNTER — Encounter: Payer: Self-pay | Admitting: Internal Medicine

## 2013-01-18 VITALS — BP 146/70 | HR 68 | Temp 97.7°F | Ht 64.0 in | Wt 164.0 lb

## 2013-01-18 DIAGNOSIS — R350 Frequency of micturition: Secondary | ICD-10-CM

## 2013-01-18 DIAGNOSIS — R3915 Urgency of urination: Secondary | ICD-10-CM

## 2013-01-18 DIAGNOSIS — N3281 Overactive bladder: Secondary | ICD-10-CM

## 2013-01-18 DIAGNOSIS — N318 Other neuromuscular dysfunction of bladder: Secondary | ICD-10-CM

## 2013-01-18 LAB — POCT URINALYSIS DIPSTICK
Bilirubin, UA: NEGATIVE
Glucose, UA: NEGATIVE
Spec Grav, UA: 1.01

## 2013-01-18 MED ORDER — MIRABEGRON ER 25 MG PO TB24: 25.0000 mg | ORAL_TABLET | Freq: Every day | ORAL | Status: DC

## 2013-01-18 NOTE — Patient Instructions (Signed)
Overactive Bladder, Adult  The bladder has two functions that are totally opposite of the other. One is to relax and stretch out so it can store urine (fills like a balloon), and the other is to contract and squeeze down so that it can empty the urine that it has stored. Proper functioning of the bladder is a complex mixing of these two functions. The filling and emptying of the bladder can be influenced by:  · The bladder.  · The spinal cord.  · The brain.  · The nerves going to the bladder.  · Other organs that are closely related to the bladder such as prostate in males and the vagina in females.  As your bladder fills with urine, nerve signals are sent from the bladder to the brain to tell you that you may need to urinate. Normal urination requires that the bladder squeeze down with sufficient strength to empty the bladder, but this also requires that the bladder squeeze down sufficiently long to finish the job. In addition the sphincter muscles, which normally keep you from leaking urine, must also relax so that the urine can pass. Coordination between the bladder muscle squeezing down and the sphincter muscles relaxing is required to make everything happen normally.  With an overactive bladder sometimes the muscles of the bladder contract unexpectedly and involuntarily and this causes an urgent need to urinate. The normal response is to try to hold urine in by contracting the sphincter muscles. Sometimes the bladder contracts so strongly that the sphincter muscles cannot stop the urine from passing out and incontinence occurs. This kind of incontinence is called urge incontinence.  Having an overactive bladder can be embarrassing and awkward. It can keep you from living life the way you want to. Many people think it is just something you have to put up with as you grow older or have certain health conditions. In fact, there are treatments that can help make your life easier and more pleasant.  CAUSES   Many  things can cause an overactive bladder. Possibilities include:  · Urinary tract infection or infection of nearby tissues such as the prostate.  · Prostate enlargement.  · In women, multiple pregnancies or surgery on the uterus or urethra.  · Bladder stones, inflammation or tumors.  · Caffeine.  · Alcohol.  · Medications. For example, diuretics (drugs that help the body get rid of extra fluid) increase urine production. Some other medicines must be taken with lots of fluids.  · Muscle or nerve weakness. This might be the result of a spinal cord injury, a stroke, multiple sclerosis or Parkinson's disease.  · Diabetes can cause a high urine volume which fills the bladder so quickly that the normal urge to urinate is triggered very strongly.  SYMPTOMS   · Loss of bladder control. You feel the need to urinate and cannot make your body wait.  · Sudden, strong urges to urinate.  · Urinating 8 or more times a day.  · Waking up to urinate two or more times a night.  DIAGNOSIS   To decide if you have overactive bladder, your healthcare provider will probably:  · Ask about symptoms you have noticed.  · Ask about your overall health. This will include questions about any medications you are taking.  · Do a physical examination. This will help determine if there are obvious blockages or other problems.  · Order some tests. These might include:  · A blood test to check for diabetes or   other health issues that could be contributing to the problem.  · Urine testing. This could measure the flow of urine and the pressure on the bladder.  · A test of your neurological system (the brain, spinal cord and nerves). This is the system that senses the need to urinate. Some of these tests are called flow tests, bladder pressure tests and electrical measurements of the sphincter muscle.  · A bladder test to check whether it is emptying completely when you urinate.  · Cytoscopy. This test uses a thin tube with a tiny camera on it. It offers a  look inside your urethra and bladder to see if there are problems.  · Imaging tests. You might be given a contrast dye and then asked to urinate. X-rays are taken to see how your bladder is working.  TREATMENT   An overactive bladder can be treated in many ways. The treatment will depend on the cause. Whether you have a mild or severe case also makes a difference. Often, treatment can be given in your healthcare provider's office or clinic. Be sure to discuss the different options with your caregiver. They include:  · Behavioral treatments. These do not involve medication or surgery:  · Bladder training. For this, you would follow a schedule to urinate at regular intervals. This helps you learn to control the urge to urinate. At first, you might be asked to wait a few minutes after feeling the urge. In time, you should be able to schedule bathroom visits an hour or more apart.  · Kegel exercises. These exercises strengthen the pelvic floor muscles, which support the bladder. By toning these muscles, they can help control urination, even if the bladder muscles are overactive. A specialist will teach you how to do these exercises correctly. They will require daily practice.  · Weight loss. If you are obese or overweight, losing weight might stop your bladder from being overactive. Talk to your healthcare provider about how many pounds you should lose. Also ask if there is a specific program or method that would work best for you.  · Diet change. This might be suggested if constipation is making your overactive bladder worse. Your healthcare provider or a nutritionist can explain ways to change what you eat to ease constipation. Other people might need to take in less caffeine or alcohol. Sometimes drinking fewer fluids is needed, too.  · Protection. This is not an actual treatment. But, you could wear special pads to take care of any leakage while you wait for other treatments to take effect. This will help you avoid  embarrassment.  · Physical treatments.  · Electrical stimulation. Electrodes will send gentle pulses to the nerves or muscles that help control the bladder. The goal is to strengthen them. Sometimes this is done with the electrodes outside of the body. Or, they might be placed inside the body (implanted). This treatment can take several months to have an effect.  · Medications. These are usually used along with other treatments. Several medicines are available. Some are injected into the muscles involved in urination. Others come in pill form. Medications sometimes prescribed include:  · Anticholinergics. These drugs block the signals that the nerves deliver to the bladder. This keeps it from releasing urine at the wrong time. Researchers think the drugs might help in other ways, too.  · Imipramine. This is an antidepressant. But, it relaxes bladder muscles.  · Botox. This is still experimental. Some people believe that injecting it into the   bladder muscles will relax them so they work more normally. It has also been injected into the sphincter muscle when the sphincter muscle does not open properly. This is a temporary fix, however. Also, it might make matters worse, especially in older people.  · Surgery.  · A device might be implanted to help manage your nerves. It works on the nerves that signal when you need to urinate.  · Surgery is sometimes needed with electrical stimulation. If the electrodes are implanted, this is done through surgery.  · Sometimes repairs need to be made through surgery. For example, the size of the bladder can be changed. This is usually done in severe cases only.  HOME CARE INSTRUCTIONS   · Take any medications your healthcare provider prescribed or suggested. Follow the directions carefully.  · Practice any lifestyle changes that are recommended. These might include:  · Drinking less fluid or drinking at different times of the day. If you need to urinate often during the night, for  example, you may need to stop drinking fluids early in the evening.  · Cutting down on caffeine or alcohol. They can both make an overactive bladder worse. Caffeine is found in coffee, tea and sodas.  · Doing Kegel exercises to strengthen muscles.  · Losing weight, if that is recommended.  · Eating a healthy and balanced diet. This will help you avoid constipation.  · Keep a journal or a log. You might be asked to record how much you drink and when, and also when you feel the need to urinate.  · Learn how to care for implants or other devices, such as pessaries.  SEEK MEDICAL CARE IF:   · Your overactive bladder gets worse.  · You feel increased pain or irritation when you urinate.  · You notice blood in your urine.  · You have questions about any medications or devices that your healthcare provider recommended.  · You notice blood, pus or swelling at the site of any test or treatment procedure.  · You have an oral temperature above 102° F (38.9° C).  SEEK IMMEDIATE MEDICAL CARE IF:   You have an oral temperature above 102° F (38.9° C), not controlled by medicine.  Document Released: 03/06/2009 Document Revised: 08/02/2011 Document Reviewed: 03/06/2009  [employer]® Patient Information ©2014 [employer].

## 2013-01-18 NOTE — Progress Notes (Signed)
HPI  Pt presents to the clinic today with c/o urinary urgency, and frequency. She often is not able to make it to the bathroom in time. She is sometimes incontinent. This started about 2 weeks ago.  She denies fever, chills, body aches, back pain. She has had recurrent UTI's in the past, last one in 11/2012. She has had an allergic reaction in the past to Keflex (SOB) and then subsequently changed to Bactrim, which took care of her symptoms. Her last culture grew out Klebsiella.   Review of Systems  Past Medical History  Diagnosis Date  . Diverticular disease 10/09/09    CT abd no sigmoid mass DDD L5/S1  . Peptic ulcer   . GERD (gastroesophageal reflux disease)   . Hiatal hernia   . Hypertension   . Dyslipidemia   . Chronic cystitis     recurrent, on suppressive doxy (uro Cope)  . Hematuria   . COPD, severe 01/27/07       . Lung cancer 1987  . Spinal abscess 2000    secondary to periodontal disease  . Osteoarthritis   . Depression with anxiety   . Osteopenia 08/2011    DEXA: L femur -1.6  . Left scapholunate ligament tear 2014    found on imaging    Family History  Problem Relation Age of Onset  . Pancreatitis Mother   . Heart disease Father   . Hypertension Father   . Emphysema Father   . Arthritis Brother   . Hypertension Brother   . Cancer Maternal Aunt     ovarian cancer    History   Social History  . Marital Status: Married    Spouse Name: N/A    Number of Children: 2  . Years of Education: N/A   Occupational History  . Retired     prior Print production planner at [employer]   Social History Main Topics  . Smoking status: Former Smoker -- 3.00 packs/day for 34 years    Types: Cigarettes    Quit date: 05/29/1995  . Smokeless tobacco: Never Used  . Alcohol Use: Yes     Comment: wine daily 2-3 glasses/day  . Drug Use: No  . Sexual Activity: Not on file   Other Topics Concern  . Not on file   Social History Narrative   Caffeine: 4 cups coffee   Remarried for the third time, 2nd husband died of heart attack. Two stepsons   Activity: Walks for exercise   Diet: good water, fruits/vegetables daily    Allergies  Allergen Reactions  . Keflex [Cephalexin] Shortness Of Breath  . Penicillins     REACTION: hives    Constitutional: Denies fever, malaise, fatigue, headache or abrupt weight changes.   GU: Pt reports urgency, frequency. Denies burning sensation, blood in urine, odor or discharge. Skin: Denies redness, rashes, lesions or ulcercations.   No other specific complaints in a complete review of systems (except as listed in HPI above).    Objective:   Physical Exam  BP 146/70  Pulse 68  Temp(Src) 97.7 F (36.5 C)  Ht 5\' 4"  (1.626 m)  Wt 164 lb (74.39 kg)  BMI 28.14 kg/m2 Wt Readings from Last 3 Encounters:  01/18/13 164 lb (74.39 kg)  12/01/12 168 lb (76.204 kg)  11/13/12 167 lb 8 oz (75.978 kg)    General: Appears her stated age, well developed, well nourished in NAD. Cardiovascular: Normal rate and rhythm. S1,S2 noted.  No murmur, rubs or gallops noted. No JVD  or BLE edema. No carotid bruits noted. Pulmonary/Chest: Normal effort and positive vesicular breath sounds. No respiratory distress. No wheezes, rales or ronchi noted.  Abdomen: Soft and nontender. Normal bowel sounds, no bruits noted. No distention or masses noted. Liver, spleen and kidneys non palpable. Tender to palpation over the bladder area. No CVA tenderness.      Assessment & Plan:   Urgency, frequency secondary to overactive bladder, new onset:  Urinalysis- +1 blood otherwise negative Lets try Myrbetriq-eRx provided Take frequent bathroom breaks Try kegel exercises to help strengthen the pelvic muscles  RTC as needed or if symptoms persist or worsen  eRx sent if for Macrobid 100 mg BID x 5 days eRx sent in for Pyridium 200 mg TID prn Drink plenty of fluids  RTC as needed or if symptoms persist.

## 2013-02-09 ENCOUNTER — Other Ambulatory Visit: Payer: Self-pay | Admitting: *Deleted

## 2013-02-09 MED ORDER — PRAVASTATIN SODIUM 80 MG PO TABS: 80.0000 mg | ORAL_TABLET | Freq: Every day | ORAL | Status: DC

## 2013-02-09 MED ORDER — METOPROLOL TARTRATE 25 MG PO TABS: 25.0000 mg | ORAL_TABLET | Freq: Two times a day (BID) | ORAL | Status: DC

## 2013-02-16 ENCOUNTER — Ambulatory Visit (INDEPENDENT_AMBULATORY_CARE_PROVIDER_SITE_OTHER): Payer: Medicare Other

## 2013-02-16 DIAGNOSIS — Z23 Encounter for immunization: Secondary | ICD-10-CM

## 2013-02-21 ENCOUNTER — Ambulatory Visit (INDEPENDENT_AMBULATORY_CARE_PROVIDER_SITE_OTHER): Payer: Medicare Other | Admitting: Family Medicine

## 2013-02-21 ENCOUNTER — Encounter: Payer: Self-pay | Admitting: Family Medicine

## 2013-02-21 VITALS — BP 142/84 | HR 72 | Temp 98.2°F | Wt 164.2 lb

## 2013-02-21 DIAGNOSIS — N390 Urinary tract infection, site not specified: Secondary | ICD-10-CM

## 2013-02-21 DIAGNOSIS — R32 Unspecified urinary incontinence: Secondary | ICD-10-CM | POA: Insufficient documentation

## 2013-02-21 DIAGNOSIS — R300 Dysuria: Secondary | ICD-10-CM

## 2013-02-21 LAB — POCT URINALYSIS DIPSTICK
Bilirubin, UA: NEGATIVE
Ketones, UA: NEGATIVE
pH, UA: 6.5

## 2013-02-21 MED ORDER — CIPROFLOXACIN HCL 500 MG PO TABS: 500.0000 mg | ORAL_TABLET | Freq: Two times a day (BID) | ORAL | Status: DC

## 2013-02-21 MED ORDER — OXYBUTYNIN CHLORIDE 5 MG PO TABS: 5.0000 mg | ORAL_TABLET | Freq: Two times a day (BID) | ORAL | Status: DC

## 2013-02-21 NOTE — Assessment & Plan Note (Signed)
Did not respond to myrbetriq.  Stop this med. Consider trial of oxybutynin (script provided today). Discussed drying effect of med.

## 2013-02-21 NOTE — Patient Instructions (Addendum)
Let's stop myrbetriq. You have urine infection. Treat with cipro twice daily for 7 days. Push fluids and plenty of rest. Let us know if not improving with treatment. After treatment, may do trial of oxybutynin 5mg  twice daily for incontinence (printed out).

## 2013-02-21 NOTE — Assessment & Plan Note (Signed)
Recurrent UTI - treat with 7 d course of cipro 500mg  bid. Lab Results  Component Value Date   CREATININE 0.9 11/13/2012  update if sxs persist despite this. This is 3rd to 4th UTI in last year (depending if prior urinary sxs were UTI). H/o chronic cystitis - consider daily ppx med (doxy) - but would need to monitor closely in h/o severe COPD, may refer back to urology.

## 2013-02-21 NOTE — Progress Notes (Signed)
  Subjective:    [name], female    DOB: February 24, 1941, 72 y.o.   MRN: [id number]  HPI CC: urinary ugency  Persistent urinary sxs.  Odor, increased nocturia x2-3 times as well as frequency and urgency during day.  + dysuria.  Lower abd discomfort.  No blood in urine.  Some nausea today.  No vomiting.  + some left flank discomfort.  No fevers/chills.  Seen here by Rene Kocher 12/2012 with concern for new onset incontinence.  Started on myrbetriq - hasn't really helped.  07/2012 UTI: >100k Klebsiella pneumonia intermediate to cipro levo nitro, resistant to ampicillin, sensitive to cefazolin, ceftriaxone, imipenem, bactrim.  Treated with bactrim course 11/2012 UTI: >100k Klebsiella pneumonia resistant bactrim, sens all other abx.  Treated with cipro. 12/2012 ?UTI (large blood on UA): UCx not sent.  Treated with myrbetriq.  ALL: CN, keflex  H/o chronic cystitis, was on doxy daily (per Dr Achilles Dunk) to help prevent recurrent infections.  This helped but she did have breakthrough sxs.  Stopped this med 2 yrs ago.  Past Medical History  Diagnosis Date  . Diverticular disease 10/09/09    CT abd no sigmoid mass DDD L5/S1  . Peptic ulcer   . GERD (gastroesophageal reflux disease)   . Hiatal hernia   . Hypertension   . Dyslipidemia   . Chronic cystitis     recurrent, on suppressive doxy (uro Cope)  . Hematuria   . COPD, severe 01/27/07       . Lung cancer 1987  . Spinal abscess 2000    secondary to periodontal disease  . Osteoarthritis   . Depression with anxiety   . Osteopenia 08/2011    DEXA: L femur -1.6  . Left scapholunate ligament tear 2014    found on imaging      Review of Systems Per HPI    Objective:   Physical Exam  Nursing note and vitals reviewed. Constitutional: She appears well-developed and well-nourished. No distress.  On O2 by [place]  Abdominal: Soft. Normal appearance and bowel sounds are normal. She exhibits no distension and no mass. There is no tenderness.  There is no rigidity, no rebound, no guarding, no CVA tenderness and negative Murphy's sign.       Assessment & Plan:

## 2013-02-24 LAB — URINE CULTURE: Colony Count: 70000

## 2013-03-01 ENCOUNTER — Other Ambulatory Visit: Payer: Self-pay | Admitting: *Deleted

## 2013-03-01 MED ORDER — OXYBUTYNIN CHLORIDE 5 MG PO TABS: 5.0000 mg | ORAL_TABLET | Freq: Two times a day (BID) | ORAL | Status: DC

## 2013-03-23 ENCOUNTER — Encounter: Payer: Self-pay | Admitting: Family Medicine

## 2013-03-23 ENCOUNTER — Ambulatory Visit (INDEPENDENT_AMBULATORY_CARE_PROVIDER_SITE_OTHER): Payer: Medicare Other | Admitting: Family Medicine

## 2013-03-23 VITALS — BP 122/84 | HR 60 | Temp 98.3°F | Wt 165.5 lb

## 2013-03-23 DIAGNOSIS — R3915 Urgency of urination: Secondary | ICD-10-CM | POA: Insufficient documentation

## 2013-03-23 LAB — POCT URINALYSIS DIPSTICK
Bilirubin, UA: NEGATIVE
Leukocytes, UA: NEGATIVE
Nitrite, UA: NEGATIVE
Urobilinogen, UA: 0.2
pH, UA: 7

## 2013-03-23 NOTE — Assessment & Plan Note (Addendum)
UA clear.  Have send culture regardless given history. ?chronic cystitis. Suggested start naprosyn OTC for bladder inflammation Did suggest return to Dr. Achilles Dunk given recurrent UTIs in past and current sxs for further eval.   Pt agrees. Encouraged increased water intake, avoid bladder irritants like caffeine.

## 2013-03-23 NOTE — Patient Instructions (Signed)
Urine is looking ok.  I have sent culture to verify if infection. While we await culture, start aleve over the counter one pill twice daily with food to see if any improvement in symptoms.   Push small sips of water throughout and avoid bladder irritants.

## 2013-03-23 NOTE — Progress Notes (Signed)
Subjective:    [name], female    DOB: Aug 07, 1940, 72 y.o.   MRN: [id number]  HPI CC: recurrent UTI  07/2012 UTI: >100k Klebsiella pneumonia intermediate to cipro levo nitro, resistant to ampicillin, sensitive to cefazolin, ceftriaxone, imipenem, bactrim. Treated with bactrim course  11/2012 UTI: >100k Klebsiella pneumonia resistant bactrim, sens all other abx. Treated with cipro.  12/2012 ?UTI (large blood on UA): UCx not sent. Treated with myrbetriq.  02/2013 UTI: 70k Enterobacter sensitive to cipro, treated with 7d course cipro.  1 wk after finished cipro course started noticing recurrent sxs.  Now endorses 3wk h/o diffuse spasm pain with entire stream "pain comes out of my fingertips", foul smell, urgency.  Dark color.  Lower abd discomfort.  No hematuria.  No fevers, nausea.    Oxybutynin is helping with urine incontinence.  H/o chronic cystitis, was on doxy daily (per Dr Achilles Dunk) to help prevent recurrent infections. This helped but she did have breakthrough sxs. Stopped this med 2 yrs ago.  Last seen 1.5 yrs ago by Dr. Achilles Dunk.  Medications and allergies reviewed and updated in chart.  Past histories reviewed and updated if relevant as below. Patient Active Problem List   Diagnosis Date Noted  . Urinary incontinence 02/21/2013  . Breast pain, right 11/13/2012  . Left wrist pain 08/08/2012  . UTI (urinary tract infection) 07/27/2012  . Arm paresthesia, left 07/11/2012  . Chest discomfort 07/11/2012  . Chronic respiratory failure with hypoxia 04/27/2012  . Medicare annual wellness visit, subsequent 09/10/2011  . Dyslipidemia 09/01/2011  . Osteopenia 08/23/2011  . FATIGUE, CHRONIC 06/19/2008  . GOLD 4 COPD with emphysema 02/01/2008  . ANXIETY 11/21/2006  . DEPRESSION 11/21/2006  . HYPERTENSION 11/21/2006  . HYPERGLYCEMIA 11/21/2006   Past Medical History  Diagnosis Date  . Diverticular disease 10/09/09    CT abd no sigmoid mass DDD L5/S1  . Peptic ulcer    . GERD (gastroesophageal reflux disease)   . Hiatal hernia   . Hypertension   . Dyslipidemia   . Chronic cystitis     recurrent, on suppressive doxy (uro Cope)  . Hematuria   . COPD, severe 01/27/07       . Lung cancer 1987  . Spinal abscess 2000    secondary to periodontal disease  . Osteoarthritis   . Depression with anxiety   . Osteopenia 08/2011    DEXA: L femur -1.6  . Left scapholunate ligament tear 2014    found on imaging   Past Surgical History  Procedure Laterality Date  . Tubal ligation      bilateral  . Bladder surgery    . Breast biopsy  03/99    left  . Mouth surgery  2000    [facility] pneumonia sepsis due to gum surgery  . Cystoscopy  02/11/07 and 09/25/08    Dr. Achilles Dunk, normal  . Cholecystectomy  02/10/09  . Total abdominal hysterectomy  07/18/01    benign reason, BSO, vag repair secondary prolapse  . Replacement total knee  09/2009    Right  . Dexa  08/2011    T femur -1.6, T spine -0.6   History  Substance Use Topics  . Smoking status: Former Smoker -- 3.00 packs/day for 34 years    Types: Cigarettes    Quit date: 05/29/1995  . Smokeless tobacco: Never Used  . Alcohol Use: Yes     Comment: wine daily 2-3 glasses/day   Family History  Problem Relation Age of Onset  .  Pancreatitis Mother   . Heart disease Father   . Hypertension Father   . Emphysema Father   . Arthritis Brother   . Hypertension Brother   . Cancer Maternal Aunt     ovarian cancer   Allergies  Allergen Reactions  . Keflex [Cephalexin] Shortness Of Breath  . Penicillins     REACTION: hives   Current Outpatient Prescriptions on File Prior to Visit  Medication Sig Dispense Refill  . albuterol (PROAIR HFA) 108 (90 BASE) MCG/ACT inhaler Inhale 2 puffs into the lungs every 4 (four) hours as needed.  1 Inhaler  0  . albuterol (PROVENTIL) (2.5 MG/3ML) 0.083% nebulizer solution Take 3 mLs (2.5 mg total) by nebulization 4 (four) times daily.  75 mL  12  . budesonide  (PULMICORT) 0.25 MG/2ML nebulizer solution Take 2 mLs (0.25 mg total) by nebulization daily.  60 mL  12  . buPROPion (WELLBUTRIN SR) 150 MG 12 hr tablet Take 1 tablet (150 mg total) by mouth 2 (two) times daily.  60 tablet  6  . cholecalciferol (VITAMIN D) 1000 UNITS tablet Take 1,000 Units by mouth daily.      . diphenhydrAMINE (BENADRYL) 25 mg capsule Take 2 by mouth at bedtime as needed       . guaifenesin (HUMIBID E) 400 MG TABS Take by mouth. Take 1 and 1/2 two times a day as needed      . loratadine (CLARITIN) 10 MG tablet Take 10 mg by mouth daily.        Marland Kitchen losartan (COZAAR) 50 MG tablet Take 1 tablet (50 mg total) by mouth daily.  30 tablet  6  . metoprolol tartrate (LOPRESSOR) 25 MG tablet Take 1 tablet (25 mg total) by mouth 2 (two) times daily.  60 tablet  6  . NON FORMULARY Oxygen 4 liters daily and 3 liters at night      . omeprazole (PRILOSEC) 40 MG capsule Take 1 capsule (40 mg total) by mouth daily.  30 capsule  11  . oxybutynin (DITROPAN) 5 MG tablet Take 1 tablet (5 mg total) by mouth 2 (two) times daily.  60 tablet  0  . pravastatin (PRAVACHOL) 80 MG tablet Take 1 tablet (80 mg total) by mouth at bedtime.  30 tablet  6  . psyllium (METAMUCIL) 58.6 % powder Take 1 packet by mouth daily.      . sertraline (ZOLOFT) 100 MG tablet Take one tablet in the morning and half tablet at night  45 tablet  9  . tiotropium (SPIRIVA) 18 MCG inhalation capsule Place 1 capsule (18 mcg total) into inhaler and inhale daily.  30 capsule  5   No current facility-administered medications on file prior to visit.      Review of Systems Per HPI    Objective:   Physical Exam  Nursing note and vitals reviewed. Constitutional: She appears well-developed and well-nourished. No distress.  Cardiovascular: Normal rate, regular rhythm, normal heart sounds and intact distal pulses.   No murmur heard. Pulmonary/Chest: Effort normal and breath sounds normal. No respiratory distress. She has no wheezes. She  has no rales.  Abdominal: Soft. Normal appearance and bowel sounds are normal. She exhibits no distension and no mass. There is no hepatosplenomegaly. There is tenderness (mild) in the right lower quadrant. There is no rigidity, no rebound, no guarding, no CVA tenderness and negative Murphy's sign.  Musculoskeletal: She exhibits no edema.       Assessment & Plan:

## 2013-03-23 NOTE — Assessment & Plan Note (Deleted)
This is 4rd to 5th UTI in last year.  H/o chronic cystitis - consider daily ppx med (doxy) - but would need to monitor closely in h/o severe COPD, may refer back to urology.

## 2013-03-28 ENCOUNTER — Encounter: Payer: Self-pay | Admitting: Pulmonary Disease

## 2013-03-28 ENCOUNTER — Ambulatory Visit: Payer: Medicare Other | Admitting: Pulmonary Disease

## 2013-03-28 ENCOUNTER — Ambulatory Visit (INDEPENDENT_AMBULATORY_CARE_PROVIDER_SITE_OTHER): Payer: Medicare Other | Admitting: Pulmonary Disease

## 2013-03-28 VITALS — BP 126/84 | HR 56 | Ht 64.0 in | Wt 165.1 lb

## 2013-03-28 DIAGNOSIS — J9610 Chronic respiratory failure, unspecified whether with hypoxia or hypercapnia: Secondary | ICD-10-CM

## 2013-03-28 DIAGNOSIS — J438 Other emphysema: Secondary | ICD-10-CM

## 2013-03-28 DIAGNOSIS — J439 Emphysema, unspecified: Secondary | ICD-10-CM

## 2013-03-28 DIAGNOSIS — R0902 Hypoxemia: Secondary | ICD-10-CM

## 2013-03-28 DIAGNOSIS — J019 Acute sinusitis, unspecified: Secondary | ICD-10-CM

## 2013-03-28 DIAGNOSIS — J9611 Chronic respiratory failure with hypoxia: Secondary | ICD-10-CM

## 2013-03-28 MED ORDER — BUDESONIDE 0.25 MG/2ML IN SUSP: 0.2500 mg | Freq: Every day | RESPIRATORY_TRACT | Status: DC

## 2013-03-28 MED ORDER — ALBUTEROL SULFATE HFA 108 (90 BASE) MCG/ACT IN AERS: 2.0000 | INHALATION_SPRAY | RESPIRATORY_TRACT | Status: DC | PRN

## 2013-03-28 MED ORDER — AZITHROMYCIN 250 MG PO TABS: ORAL_TABLET | ORAL | Status: AC

## 2013-03-28 MED ORDER — TIOTROPIUM BROMIDE MONOHYDRATE 18 MCG IN CAPS: 18.0000 ug | ORAL_CAPSULE | Freq: Every day | RESPIRATORY_TRACT | Status: DC

## 2013-03-28 MED ORDER — ALBUTEROL SULFATE (2.5 MG/3ML) 0.083% IN NEBU: 2.5000 mg | INHALATION_SOLUTION | Freq: Four times a day (QID) | RESPIRATORY_TRACT | Status: DC

## 2013-03-28 NOTE — Assessment & Plan Note (Signed)
This has been a stable interval for [name] [name].  Plan: -flu shot is up to date -continue pulmicort and spiriva -continue oxygen

## 2013-03-28 NOTE — Assessment & Plan Note (Signed)
Continue 4L with exertion and 3 L at rest.

## 2013-03-28 NOTE — Progress Notes (Signed)
Subjective:    [name], female    DOB: 1940/10/15, 72 y.o.   MRN: [id number]  Synopsis: [name] [name] has gold stage IV COPD and has followed with Dr. Craige Cotta in [place] since around 2007. She uses 4 L of oxygen with exertion and 3 L at rest. She participates in pulmonary rehabilitation at [facility]. Because she lives in [place] she switched her care to Dr. Kendrick Fries in may of 2014.  HPI   09/26/2012 ROV>> [name] [name] says that one week ago she had sinus congestion and mucus production. She saw Dr. Dayton Martes who gave her a prescription for Levaquin and recommended that she start using over-the-counter Nasacort. She says that the mucus production has cleared up and she has less frequent cough. Her sinus congestion has improved greatly. Aside from that episode, she states that her shortness of breath has been doing quite well on her current regimen which includes Spiriva and Pulmicort. She continues to use 4 L of oxygen with exertion and 3 L at rest and benefits from this. She states that participation in pulmonary rehabilitation has improved her ability to exercise. She is now able to do activities in her house (vacuum, cleaning curtains) and climb flights of stairs but she did not know that she could do.  03/28/2013 ROV >  She is still enjoying life every day.  She feels that the albuterol is still helping as well as the pulmicort.  She has had a sore throat and some hoarseness for about a week.  If she takes the albuterol it really helps.  No fever or chills and no increase in dyspnea.  She is sometimes producing green sputum.  She has had sinus congestion for a week.  Past Medical History  Diagnosis Date  . Diverticular disease 10/09/09    CT abd no sigmoid mass DDD L5/S1  . Peptic ulcer   . GERD (gastroesophageal reflux disease)   . Hiatal hernia   . Hypertension   . Dyslipidemia   . Chronic cystitis     recurrent, on suppressive doxy (uro Cope)  .  Hematuria   . COPD, severe 01/27/07       . Lung cancer 1987  . Spinal abscess 2000    secondary to periodontal disease  . Osteoarthritis   . Depression with anxiety   . Osteopenia 08/2011    DEXA: L femur -1.6  . Left scapholunate ligament tear 2014    found on imaging        Review of Systems  Constitutional: Negative for fever, chills and unexpected weight change.  HENT: Positive for postnasal drip, sore throat and voice change. Negative for congestion, dental problem, ear pain, nosebleeds, rhinorrhea, sinus pressure, sneezing and trouble swallowing.   Respiratory: Positive for cough and shortness of breath. Negative for choking.   Cardiovascular: Negative for chest pain and leg swelling.       Objective:   Physical Exam  Filed Vitals:   03/28/13 1111  BP: 126/84  Pulse: 56  Height: 5\' 4"  (1.626 m)  Weight: 165 lb 1.9 oz (74.898 kg)  SpO2: 97%  On 3 L [place]  Gen: well appearing, no acute distress HEENT: NCAT,  EOMi, OP clear,  PULM: Again few wheezes upper lobes, otherwise clear CV: RRR, no mgr, no JVD AB: BS+, soft, nontender, no hsm Ext: warm, no edema, no clubbing, no cyanosis      Assessment & Plan:   GOLD 4 COPD with emphysema This has been  a stable interval for [name] [name].  Plan: -flu shot is up to date -continue pulmicort and spiriva -continue oxygen  Acute sinusitis This has lasted for more than a week now and is causing her to produce green sputum.    Plan: -based on duration and cough, will give a z-pack  Chronic respiratory failure with hypoxia Continue 4L with exertion and 3 L at rest.    Updated Medication List Outpatient Encounter Prescriptions as of 03/28/2013  Medication Sig  . albuterol (PROAIR HFA) 108 (90 BASE) MCG/ACT inhaler Inhale 2 puffs into the lungs every 4 (four) hours as needed.  Marland Kitchen albuterol (PROVENTIL) (2.5 MG/3ML) 0.083% nebulizer solution Take 3 mLs (2.5 mg total) by nebulization 4 (four) times daily.  . budesonide  (PULMICORT) 0.25 MG/2ML nebulizer solution Take 2 mLs (0.25 mg total) by nebulization daily.  Marland Kitchen buPROPion (WELLBUTRIN SR) 150 MG 12 hr tablet Take 1 tablet (150 mg total) by mouth 2 (two) times daily.  . cholecalciferol (VITAMIN D) 1000 UNITS tablet Take 1,000 Units by mouth daily.  . diphenhydrAMINE (BENADRYL) 25 mg capsule Take 2 by mouth at bedtime as needed   . guaifenesin (HUMIBID E) 400 MG TABS Take by mouth. Take 1 and 1/2 two times a day as needed  . loratadine (CLARITIN) 10 MG tablet Take 10 mg by mouth daily.    Marland Kitchen losartan (COZAAR) 50 MG tablet Take 1 tablet (50 mg total) by mouth daily.  . metoprolol tartrate (LOPRESSOR) 25 MG tablet Take 1 tablet (25 mg total) by mouth 2 (two) times daily.  . NON FORMULARY Oxygen 4 liters daily and 3 liters at night  . omeprazole (PRILOSEC) 40 MG capsule Take 1 capsule (40 mg total) by mouth daily.  Marland Kitchen oxybutynin (DITROPAN) 5 MG tablet Take 1 tablet (5 mg total) by mouth 2 (two) times daily.  . pravastatin (PRAVACHOL) 80 MG tablet Take 1 tablet (80 mg total) by mouth at bedtime.  . psyllium (METAMUCIL) 58.6 % powder Take 1 packet by mouth daily.  . sertraline (ZOLOFT) 100 MG tablet Take one tablet in the morning and half tablet at night  . tiotropium (SPIRIVA) 18 MCG inhalation capsule Place 1 capsule (18 mcg total) into inhaler and inhale daily.

## 2013-03-28 NOTE — Assessment & Plan Note (Addendum)
This has lasted for more than a week now and is causing her to produce green sputum.    Plan: -based on duration and cough, will give a z-pack

## 2013-03-28 NOTE — Patient Instructions (Signed)
Take the Zpack as written Keep using your inhalers as you are doing We will see you back in 6 months or sooner if needed

## 2013-04-10 ENCOUNTER — Ambulatory Visit (INDEPENDENT_AMBULATORY_CARE_PROVIDER_SITE_OTHER): Payer: Medicare Other | Admitting: Family Medicine

## 2013-04-10 ENCOUNTER — Encounter: Payer: Self-pay | Admitting: Family Medicine

## 2013-04-10 VITALS — BP 142/78 | HR 72 | Temp 98.4°F | Wt 166.2 lb

## 2013-04-10 DIAGNOSIS — J019 Acute sinusitis, unspecified: Secondary | ICD-10-CM

## 2013-04-10 MED ORDER — DOXYCYCLINE HYCLATE 100 MG PO CAPS: 100.0000 mg | ORAL_CAPSULE | Freq: Two times a day (BID) | ORAL | Status: DC

## 2013-04-10 NOTE — Progress Notes (Signed)
Pre-visit discussion using our clinic review tool. No additional management support is needed unless otherwise documented below in the visit note.  

## 2013-04-10 NOTE — Assessment & Plan Note (Signed)
With left serous otitis media. Will treat with doxcycyline 10d course. Red flags to return discussed. Supportive care with tylenol, fluids and rest. Pt agrees with plan.

## 2013-04-10 NOTE — Patient Instructions (Signed)
You have sinus infection and fluid in the left ear Treat with doxycycline as well as continue to push fluids and rest. May use tylenol for discomfort. Update [place] if fever >101 or worsening productive cough.

## 2013-04-10 NOTE — Progress Notes (Signed)
  Subjective:    [name], female    DOB: [birth year], 72 y.o.   MRN: [id number]  HPI CC: not feeling well  1d h/o malaise, severe sore throat, ear pain, facial sinus pressure pain, nose bleeding from L nare.  + chills.  + PNDrainage.  Mild cough productive of green sputum.  No fevers, abd pain, nausea/vomiting, tooth pain.  So far has tried salt water gargles.  She was seen by pulm about 2 weeks ago, treated for sinusitis with zpack.  Lost final pill.  No sick contacts at home. Did receive flu shot. Not around smokers. No significant h/o allergic rhinitis. ++ severe COPD on chronic oxygen therapy.  Past Medical History  Diagnosis Date  . Diverticular disease 10/09/09    CT abd no sigmoid mass DDD L5/S1  . Peptic ulcer   . GERD (gastroesophageal reflux disease)   . Hiatal hernia   . Hypertension   . Dyslipidemia   . Chronic cystitis     recurrent, on suppressive doxy (uro Cope)  . Hematuria   . COPD, severe 01/27/07       . Lung cancer 1987  . Spinal abscess 2000    secondary to periodontal disease  . Osteoarthritis   . Depression with anxiety   . Osteopenia 08/2011    DEXA: L femur -1.6  . Left scapholunate ligament tear 2014    found on imaging     Review of Systems Per HPI    Objective:   Physical Exam  Nursing note and vitals reviewed. Constitutional: She appears well-developed and well-nourished. No distress.  HENT:  Head: Normocephalic and atraumatic.  Right Ear: Hearing, tympanic membrane, external ear and ear canal normal.  Left Ear: Hearing, external ear and ear canal normal.  Nose: No mucosal edema or rhinorrhea. Right sinus exhibits maxillary sinus tenderness. Right sinus exhibits no frontal sinus tenderness. Left sinus exhibits maxillary sinus tenderness. Left sinus exhibits no frontal sinus tenderness.  Mouth/Throat: Uvula is midline and mucous membranes are normal. Posterior oropharyngeal erythema present. No oropharyngeal  exudate, posterior oropharyngeal edema or tonsillar abscesses.  L TM bulging, fluid and congestion behind TM Anterior nasal septum on right with residual blood Erythematous oropharynx.  Eyes: Conjunctivae and EOM are normal. Pupils are equal, round, and reactive to light. No scleral icterus.  Neck: Normal range of motion. Neck supple.  Cardiovascular: Normal rate, regular rhythm, normal heart sounds and intact distal pulses.   No murmur heard. Pulmonary/Chest: Effort normal and breath sounds normal. No respiratory distress. She has no wheezes. She has no rales.  Musculoskeletal: She exhibits no edema.  Lymphadenopathy:    She has no cervical adenopathy.  Skin: Skin is warm and dry. No rash noted.       Assessment & Plan:

## 2013-04-12 ENCOUNTER — Other Ambulatory Visit: Payer: Self-pay | Admitting: Family Medicine

## 2013-05-03 ENCOUNTER — Other Ambulatory Visit: Payer: Self-pay | Admitting: Family Medicine

## 2013-06-11 ENCOUNTER — Ambulatory Visit (INDEPENDENT_AMBULATORY_CARE_PROVIDER_SITE_OTHER): Payer: Medicare HMO | Admitting: Family Medicine

## 2013-06-11 ENCOUNTER — Encounter: Payer: Self-pay | Admitting: Family Medicine

## 2013-06-11 VITALS — BP 136/84 | HR 68 | Temp 98.0°F | Wt 168.5 lb

## 2013-06-11 DIAGNOSIS — R350 Frequency of micturition: Secondary | ICD-10-CM

## 2013-06-11 DIAGNOSIS — N390 Urinary tract infection, site not specified: Secondary | ICD-10-CM

## 2013-06-11 LAB — POCT URINALYSIS DIPSTICK
Bilirubin, UA: NEGATIVE
Glucose, UA: NEGATIVE
KETONES UA: NEGATIVE
Nitrite, UA: POSITIVE
Protein, UA: NEGATIVE
Spec Grav, UA: 1.01
Urobilinogen, UA: 0.2
pH, UA: 7

## 2013-06-11 MED ORDER — CIPROFLOXACIN HCL 250 MG PO TABS: 250.0000 mg | ORAL_TABLET | Freq: Two times a day (BID) | ORAL | Status: DC

## 2013-06-11 NOTE — Patient Instructions (Signed)
You have recurrent UTI - treat with 7 d course of ciprofloxacin twice daily. Let us know if symptoms persist or fail to improve after treatment. Good to see you today, call us with questions.

## 2013-06-11 NOTE — Progress Notes (Signed)
   Subjective:    [name], female    DOB: [birth year], 73 y.o.   MRN: [id number]  HPI CC: rpt UTI?  H/o recurrent UTIs and ?chronic cystitis.  Prior on daily UTI ppx med.  Has not returned to see Dr. Jacqlyn Larsen.  Not currently on UTI ppx abx.  Last UTI was 02/2013, enterobacter responsive to cipro.  Over last few months, noticing increasing urgency, frequency, spasm/burning at end of stream.  Mild nausea controlled on omeprazole.  Denies fevers/chills, abd pain, nausea, flank pain.  No hematuria.  Past Medical History  Diagnosis Date  . Diverticular disease 10/09/09    CT abd no sigmoid mass DDD L5/S1  . Peptic ulcer   . GERD (gastroesophageal reflux disease)   . Hiatal hernia   . Hypertension   . Dyslipidemia   . Chronic cystitis     recurrent, on suppressive doxy (uro Cope)  . Hematuria   . COPD, severe 01/27/07       . Lung cancer 1987  . Spinal abscess 2000    secondary to periodontal disease  . Osteoarthritis   . Depression with anxiety   . Osteopenia 08/2011    DEXA: L femur -1.6  . Left scapholunate ligament tear 2014    found on imaging    Review of Systems Per HPI    Objective:   Physical Exam  Nursing note and vitals reviewed. Constitutional: She appears well-developed and well-nourished. No distress.  Abdominal: Soft. Normal appearance and bowel sounds are normal. She exhibits no distension and no mass. There is no hepatosplenomegaly. There is no tenderness. There is no rigidity, no rebound, no guarding, no CVA tenderness and negative Murphy's sign.       Assessment & Plan:

## 2013-06-11 NOTE — Progress Notes (Signed)
Pre-visit discussion using our clinic review tool. No additional management support is needed unless otherwise documented below in the visit note.  

## 2013-06-11 NOTE — Assessment & Plan Note (Signed)
UA/micro today consistent with repeat UTI - treat with 7d cipro 250mg  bid course. Update if sxs persist despite this. Consider doxy ppx in h/o chronic cystitis.  Will not start this time.

## 2013-06-13 LAB — URINE CULTURE: Colony Count: >=100000

## 2013-06-21 ENCOUNTER — Telehealth: Payer: Self-pay | Admitting: Pulmonary Disease

## 2013-06-21 DIAGNOSIS — J9611 Chronic respiratory failure with hypoxia: Secondary | ICD-10-CM

## 2013-06-21 DIAGNOSIS — J439 Emphysema, unspecified: Secondary | ICD-10-CM

## 2013-06-21 NOTE — Telephone Encounter (Signed)
Called and spoke with pt. Aware we will send in order for pt. Nothing further needed

## 2013-07-23 ENCOUNTER — Telehealth: Payer: Self-pay | Admitting: Family Medicine

## 2013-07-23 NOTE — Telephone Encounter (Signed)
Patient Information:  Caller Name: Annice  Phone: [phone]  Patient: [name] [name], [name] [name]  Gender: Female  DOB: [birth year]  Age: 73 Years  PCP: Ria Bush [facility])  Office Follow Up:  Does the office need to follow up with this patient?: Yes  Instructions For The Office: Patient needs to be see due to urinary symptoms.  She declined offer to see Dr. Glori Bickers at 15:30 today  Requests Rx to be called in.  Reminded her of policy to see patients prior to ordering antibioitics.  She states she cannot come before 07/24/13 pm.  Please follow up with her.   Symptoms  Reason For Call & Symptoms: Patient calling about foul smelling cloudy urine, lower  back pain, spasms at end of stream.  Symptoms are getting worse.  Most recent UTI 06/11/13 and she was treated with Cipro.  Pain rated at 7 of 10.  Emergent symptoms ruled out.  Go to Office Now per Urination Pain - Female guideline due to Side or lower back pain present.  Caller requests to have Rx called to pharmacy.  Explained she needs to be evaluated.  Reviewed Health History In EMR: Yes  Reviewed Medications In EMR: Yes  Reviewed Allergies In EMR: Yes  Reviewed Surgeries / Procedures: Yes  Date of Onset of Symptoms: 07/16/2013  Treatments Tried: Increased water intake  Treatments Tried Worked: No  Guideline(s) Used:  Urination Pain - Female  Disposition Per Guideline:   Go to Office Now  Reason For Disposition Reached:   Side (flank) or lower back pain present  Advice Given:  Fluids:   Drink extra fluids. Drink 8-10 glasses of liquids a day (Reason: to produce a dilute, non-irritating urine).  Cranberry Juice:   Dosage Cranberry Juice Cocktail: 8 oz (240 ml) twice a day.  Call Back If:  You become worse.  Patient Refused Recommendation:  Patient Refused Appt, Patient Requests Appt At Later Date  Caller declined appointment for 07/23/13 with Dr. Glori Bickers.  States she can come 07/24/13 after lunch if she cannot get Rx called  in.  No appointment scheduled; patient states she will call for appointment on 07/24/13.

## 2013-07-23 NOTE — Telephone Encounter (Signed)
Would use AZO and fluids tonight, then f/u tomorrow.

## 2013-07-23 NOTE — Telephone Encounter (Signed)
Patient notified and appt scheduled.

## 2013-07-24 ENCOUNTER — Encounter: Payer: Self-pay | Admitting: Internal Medicine

## 2013-07-24 ENCOUNTER — Ambulatory Visit (INDEPENDENT_AMBULATORY_CARE_PROVIDER_SITE_OTHER): Payer: Medicare Other | Admitting: Internal Medicine

## 2013-07-24 VITALS — BP 120/80 | HR 65 | Temp 98.1°F | Wt 169.0 lb

## 2013-07-24 DIAGNOSIS — N390 Urinary tract infection, site not specified: Secondary | ICD-10-CM

## 2013-07-24 DIAGNOSIS — R300 Dysuria: Secondary | ICD-10-CM

## 2013-07-24 LAB — POCT URINALYSIS DIPSTICK
Bilirubin, UA: NEGATIVE
GLUCOSE UA: NEGATIVE
Ketones, UA: NEGATIVE
NITRITE UA: POSITIVE
Spec Grav, UA: <=1.005
Urobilinogen, UA: NEGATIVE
pH, UA: 6

## 2013-07-24 MED ORDER — SULFAMETHOXAZOLE-TMP DS 800-160 MG PO TABS: 1.0000 | ORAL_TABLET | Freq: Two times a day (BID) | ORAL | Status: DC

## 2013-07-24 NOTE — Patient Instructions (Signed)
Please start cranberry tablets daily and drink plenty of fluids.

## 2013-07-24 NOTE — Progress Notes (Signed)
Pre visit review using our clinic review tool, if applicable. No additional management support is needed unless otherwise documented below in the visit note. 

## 2013-07-24 NOTE — Progress Notes (Signed)
Subjective:    [name], female    DOB: Apr 07, 1941, 73 y.o.   MRN: [id number]  HPI Got completely better with the course of cipro Noted some symptoms returning within a week of finishing Bad odor Increasing frequency and urgency Then she was noticing bladder spasm  No meds for this No apparent fever No sweats or chills Has tried to increase her fluids  Current Outpatient Prescriptions on File Prior to Visit  Medication Sig Dispense Refill  . albuterol (PROAIR HFA) 108 (90 BASE) MCG/ACT inhaler Inhale 2 puffs into the lungs every 4 (four) hours as needed.  1 Inhaler  0  . albuterol (PROVENTIL) (2.5 MG/3ML) 0.083% nebulizer solution Take 3 mLs (2.5 mg total) by nebulization 4 (four) times daily.  75 mL  12  . budesonide (PULMICORT) 0.25 MG/2ML nebulizer solution Take 2 mLs (0.25 mg total) by nebulization daily.  60 mL  12  . buPROPion (WELLBUTRIN SR) 150 MG 12 hr tablet TAKE ONE TABLET BY MOUTH TWICE DAILY  60 tablet  6  . cholecalciferol (VITAMIN D) 1000 UNITS tablet Take 1,000 Units by mouth daily.      . diphenhydrAMINE (BENADRYL) 25 mg capsule Take 2 by mouth at bedtime as needed       . guaifenesin (HUMIBID E) 400 MG TABS Take by mouth.       . loratadine (CLARITIN) 10 MG tablet Take 10 mg by mouth daily.        Marland Kitchen losartan (COZAAR) 50 MG tablet Take 1 tablet (50 mg total) by mouth daily.  30 tablet  6  . metoprolol tartrate (LOPRESSOR) 25 MG tablet Take 1 tablet (25 mg total) by mouth 2 (two) times daily.  60 tablet  6  . Multiple Vitamin (MULTIVITAMIN) tablet Take 1 tablet by mouth daily.      . NON FORMULARY Oxygen 4 liters daily and 3 liters at night      . omeprazole (PRILOSEC) 40 MG capsule TAKE ONE CAPSULE BY MOUTH EVERY DAY  30 capsule  6  . oxybutynin (DITROPAN) 5 MG tablet TAKE ONE TABLET BY MOUTH TWICE A DAY.  60 tablet  3  . pravastatin (PRAVACHOL) 80 MG tablet Take 1 tablet (80 mg total) by mouth at bedtime.  30 tablet  6  . psyllium (METAMUCIL)  58.6 % powder Take 1 packet by mouth daily.      . sertraline (ZOLOFT) 100 MG tablet Take one tablet in the morning and half tablet at night  45 tablet  9  . tiotropium (SPIRIVA) 18 MCG inhalation capsule Place 1 capsule (18 mcg total) into inhaler and inhale daily.  30 capsule  5   No current facility-administered medications on file prior to visit.    Allergies  Allergen Reactions  . Keflex [Cephalexin] Shortness Of Breath  . Penicillins     REACTION: hives    Past Medical History  Diagnosis Date  . Diverticular disease 10/09/09    CT abd no sigmoid mass DDD L5/S1  . Peptic ulcer   . GERD (gastroesophageal reflux disease)   . Hiatal hernia   . Hypertension   . Dyslipidemia   . Chronic cystitis     recurrent, on suppressive doxy (uro Cope)  . Hematuria     h/o kidney stones  . COPD, severe 01/27/07       . Lung cancer 1987  . Spinal abscess 2000    secondary to periodontal disease  . Osteoarthritis   .  Depression with anxiety   . Osteopenia 08/2011    DEXA: L femur -1.6  . Left scapholunate ligament tear 2014    found on imaging    Past Surgical History  Procedure Laterality Date  . Tubal ligation      bilateral  . Bladder surgery    . Breast biopsy  03/99    left  . Mouth surgery  [facility] pneumonia sepsis due to gum surgery  . Cystoscopy  02/11/07 and 09/25/08    Dr. Jacqlyn Larsen, normal  . Cholecystectomy  02/10/09  . Total abdominal hysterectomy  07/18/01    benign reason, BSO, vag repair secondary prolapse  . Replacement total knee  09/2009    Right  . Dexa  08/2011    T femur -1.6, T spine -0.6    Family History  Problem Relation Age of Onset  . Pancreatitis Mother   . Heart disease Father   . Hypertension Father   . Emphysema Father   . Arthritis Brother   . Hypertension Brother   . Cancer Maternal Aunt     ovarian cancer    History   Social History  . Marital Status: Married    Spouse Name: N/A    Number of Children: 2  . Years  of Education: N/A   Occupational History  . Retired     prior Glass blower/designer at [employer] History Main Topics  . Smoking status: Former Smoker -- 3.00 packs/day for 34 years    Types: Cigarettes    Quit date: 05/29/1995  . Smokeless tobacco: Never Used  . Alcohol Use: Yes     Comment: wine daily 2-3 glasses/day  . Drug Use: No  . Sexual Activity: Not on file   Other Topics Concern  . Not on file   Social History Narrative   Caffeine: 4 cups coffee   Remarried for the third time, 2nd husband died of heart attack. Two stepsons   Activity: Walks for exercise   Diet: good water, fruits/vegetables daily   Review of Systems Has had some abdominal pain--diverticulitis in past  Appetite is off Stress with husband, TKR hasn't done well (may be infected)    Objective:   Physical Exam  Constitutional: She appears well-developed and well-nourished. No distress.  Abdominal: Soft. She exhibits no distension. There is no tenderness. There is no rebound and no guarding.  Musculoskeletal:  No sig CVA tenderness          Assessment & Plan:

## 2013-07-24 NOTE — Assessment & Plan Note (Signed)
Clear recurrence fairly quickly Recommended cranberry tabs Will give 10 days of septra  Will send chart to Dr G---may want to start daily preventative after the acute course

## 2013-07-28 ENCOUNTER — Telehealth: Payer: Self-pay | Admitting: Family Medicine

## 2013-07-28 ENCOUNTER — Encounter: Payer: Self-pay | Admitting: Family Medicine

## 2013-07-28 DIAGNOSIS — N390 Urinary tract infection, site not specified: Secondary | ICD-10-CM

## 2013-07-28 MED ORDER — DOXYCYCLINE HYCLATE 100 MG PO CAPS: 100.0000 mg | ORAL_CAPSULE | Freq: Every day | ORAL | Status: DC

## 2013-07-28 NOTE — Telephone Encounter (Signed)
plz notify I reviewed recent office visit with Dr. Silvio Pate.  Given # of UTIs in last year, I do recommend restarting prophylactic antibiotic once she finishes septra course. I've sent in doxycycline to take once daily which she was previously on and tolerated well.  However I recommend she come in once done with septra for urine culture (only). Also agree with cranberry tablets.  Update [place] if UTI sxs recur.

## 2013-07-28 NOTE — Assessment & Plan Note (Signed)
4+ UTIs in last year - start ppx doxy (07/2013)

## 2013-07-30 NOTE — Telephone Encounter (Signed)
Patient notified and will drop off urine sample prior to starting doxy. She will also start cranberry supplement.

## 2013-08-06 ENCOUNTER — Other Ambulatory Visit: Payer: Medicare Other

## 2013-08-06 DIAGNOSIS — N390 Urinary tract infection, site not specified: Secondary | ICD-10-CM

## 2013-08-07 ENCOUNTER — Other Ambulatory Visit: Payer: Self-pay | Admitting: Family Medicine

## 2013-08-07 LAB — URINE CULTURE
Colony Count: NO GROWTH
ORGANISM ID, BACTERIA: NO GROWTH

## 2013-09-19 ENCOUNTER — Other Ambulatory Visit: Payer: Self-pay | Admitting: Family Medicine

## 2013-09-28 ENCOUNTER — Other Ambulatory Visit: Payer: Self-pay | Admitting: Family Medicine

## 2013-10-02 ENCOUNTER — Encounter: Payer: Self-pay | Admitting: Family Medicine

## 2013-10-02 ENCOUNTER — Ambulatory Visit (INDEPENDENT_AMBULATORY_CARE_PROVIDER_SITE_OTHER): Payer: Medicare Other | Admitting: Family Medicine

## 2013-10-02 VITALS — BP 132/68 | HR 68 | Temp 98.1°F | Wt 167.2 lb

## 2013-10-02 DIAGNOSIS — J029 Acute pharyngitis, unspecified: Secondary | ICD-10-CM

## 2013-10-02 DIAGNOSIS — J028 Acute pharyngitis due to other specified organisms: Principal | ICD-10-CM

## 2013-10-02 DIAGNOSIS — B9789 Other viral agents as the cause of diseases classified elsewhere: Principal | ICD-10-CM

## 2013-10-02 NOTE — Progress Notes (Signed)
Pre visit review using our clinic review tool, if applicable. No additional management support is needed unless otherwise documented below in the visit note. 

## 2013-10-02 NOTE — Progress Notes (Signed)
BP 132/68  Pulse 68  Temp(Src) 98.1 F (36.7 C) (Oral)  Wt 167 lb 4 oz (75.864 kg)   CC: sore throat  Subjective:    [name], female    DOB: [birth year], 73 y.o.   MRN: [id number]  HPI: [name] [name] is a 73 y.o. female presenting on 10/02/2013 for Sore Throat   ST - describes 1 wk h/o ST with tightness sensation worse at left side of throat, trouble swallowing when laying down at night time.  Took 2 aleve which helped.  + chills.  Mild L ear pain.  + head congestion.  Mild cough as well.  No fevers, tooth pain, abd pain, nausea.   No sick contacts at home.   No smokers at home.  H/o COPD on oxygen.  H/o recurrent UTI now on cranberry PO and doxy 100mg  daily. Keflex and PCN allergy.  Relevant past medical, surgical, family and social history reviewed and updated as indicated.  Allergies and medications reviewed and updated. Current Outpatient Prescriptions on File Prior to Visit  Medication Sig  . albuterol (PROAIR HFA) 108 (90 BASE) MCG/ACT inhaler Inhale 2 puffs into the lungs every 4 (four) hours as needed.  Marland Kitchen albuterol (PROVENTIL) (2.5 MG/3ML) 0.083% nebulizer solution Take 3 mLs (2.5 mg total) by nebulization 4 (four) times daily.  . budesonide (PULMICORT) 0.25 MG/2ML nebulizer solution Take 2 mLs (0.25 mg total) by nebulization daily.  Marland Kitchen buPROPion (WELLBUTRIN SR) 150 MG 12 hr tablet TAKE ONE TABLET BY MOUTH TWICE DAILY  . diphenhydrAMINE (BENADRYL) 25 mg capsule Take 2 by mouth at bedtime as needed   . doxycycline (VIBRAMYCIN) 100 MG capsule Take 1 capsule (100 mg total) by mouth daily.  Marland Kitchen guaifenesin (HUMIBID E) 400 MG TABS Take by mouth.   . loratadine (CLARITIN) 10 MG tablet Take 10 mg by mouth daily.    Marland Kitchen losartan (COZAAR) 50 MG tablet TAKE ONE TABLET BY MOUTH EVERY DAY  . metoprolol tartrate (LOPRESSOR) 25 MG tablet TAKE ONE TABLET BY MOUTH TWICE DAILY  . Multiple Vitamin (MULTIVITAMIN) tablet Take 1 tablet by mouth daily.  . NON FORMULARY  Oxygen 4 liters daily and 3 liters at night  . omeprazole (PRILOSEC) 40 MG capsule TAKE ONE CAPSULE BY MOUTH EVERY DAY  . oxybutynin (DITROPAN) 5 MG tablet TAKE ONE TABLET BY MOUTH TWICE DAILY  . pravastatin (PRAVACHOL) 80 MG tablet ONE TABLET BY MOUTH AT BEDTIME  . psyllium (METAMUCIL) 58.6 % powder Take 1 packet by mouth daily.  . sertraline (ZOLOFT) 100 MG tablet Take one tablet in the morning and half tablet at night  . tiotropium (SPIRIVA) 18 MCG inhalation capsule Place 1 capsule (18 mcg total) into inhaler and inhale daily.  . cholecalciferol (VITAMIN D) 1000 UNITS tablet Take 1,000 Units by mouth daily.   No current facility-administered medications on file prior to visit.    Review of Systems Per HPI unless specifically indicated above    Objective:    BP 132/68  Pulse 68  Temp(Src) 98.1 F (36.7 C) (Oral)  Wt 167 lb 4 oz (75.864 kg)  Physical Exam  Nursing note and vitals reviewed. Constitutional: She appears well-developed and well-nourished. No distress.  HENT:  Head: Normocephalic and atraumatic.  Right Ear: Hearing, tympanic membrane, external ear and ear canal normal.  Left Ear: Hearing, tympanic membrane, external ear and ear canal normal.  Nose: No mucosal edema or rhinorrhea. Right sinus exhibits no maxillary sinus tenderness and no frontal sinus tenderness.  Left sinus exhibits no maxillary sinus tenderness and no frontal sinus tenderness.  Mouth/Throat: Uvula is midline, oropharynx is clear and moist and mucous membranes are normal. No oropharyngeal exudate, posterior oropharyngeal edema, posterior oropharyngeal erythema or tonsillar abscesses.  Some congestion behind TMs bilaterally Pale turbinates  Eyes: Conjunctivae and EOM are normal. Pupils are equal, round, and reactive to light. No scleral icterus.  Neck: Normal range of motion. Neck supple.  Cardiovascular: Normal rate, regular rhythm, normal heart sounds and intact distal pulses.   No murmur  heard. Pulmonary/Chest: Effort normal and breath sounds normal. No respiratory distress. She has no wheezes. She has no rales.  Lymphadenopathy:    She has no cervical adenopathy.  Skin: Skin is warm and dry. No rash noted.   Results for orders placed in visit on 08/06/13  URINE CULTURE      Result Value Ref Range   Colony Count NO GROWTH     Organism ID, Bacteria NO GROWTH        Assessment & Plan:   Problem List Items Addressed This Visit   Acute viral pharyngitis - Primary     No evidence of bacterial infection ,lungs clear. Supportive care as per instructions. Red flags to return discussed. Pt agrees with plan.        Follow up plan: Return if symptoms worsen or fail to improve.

## 2013-10-02 NOTE — Assessment & Plan Note (Signed)
No evidence of bacterial infection ,lungs clear. Supportive care as per instructions. Red flags to return discussed. Pt agrees with plan.

## 2013-10-02 NOTE — Patient Instructions (Signed)
Check to make sure you're taking pulmicort (budesonide) 1 nebulization daily.  This is daily COPD medicine along with spiriva. You have viral pharyngitis.  Antibiotics are not needed currently. May use aleve 1 tablet twice daily with food for throat inflammation for next 2-3 days then should improve. Push fluids and plenty of rest.  Salt water gargles. Suck on cold things like popsicles or warm things like herbal teas (whichever soothes the throat better). Return if fever >101.5, worsening pain, or trouble opening/closing mouth, or hoarse voice. Good to see you today, call clinic with questions.

## 2013-10-12 ENCOUNTER — Encounter: Payer: Self-pay | Admitting: Family Medicine

## 2013-10-12 ENCOUNTER — Ambulatory Visit (INDEPENDENT_AMBULATORY_CARE_PROVIDER_SITE_OTHER): Payer: Medicare Other | Admitting: Family Medicine

## 2013-10-12 VITALS — BP 136/78 | HR 68 | Temp 98.3°F | Wt 164.5 lb

## 2013-10-12 DIAGNOSIS — R350 Frequency of micturition: Secondary | ICD-10-CM

## 2013-10-12 DIAGNOSIS — N390 Urinary tract infection, site not specified: Secondary | ICD-10-CM

## 2013-10-12 LAB — POCT URINALYSIS DIPSTICK
Bilirubin, UA: NEGATIVE
Glucose, UA: NEGATIVE
Ketones, UA: NEGATIVE
Nitrite, UA: POSITIVE
Protein, UA: NEGATIVE
Spec Grav, UA: 1.015
UROBILINOGEN UA: 0.2
pH, UA: 6

## 2013-10-12 MED ORDER — SULFAMETHOXAZOLE-TMP DS 800-160 MG PO TABS: 1.0000 | ORAL_TABLET | Freq: Two times a day (BID) | ORAL | Status: DC

## 2013-10-12 MED ORDER — SULFAMETHOXAZOLE-TRIMETHOPRIM 400-80 MG PO TABS: 0.5000 | ORAL_TABLET | Freq: Every day | ORAL | Status: DC

## 2013-10-12 NOTE — Progress Notes (Signed)
BP 136/78  Pulse 68  Temp(Src) 98.3 F (36.8 C) (Oral)  Wt 164 lb 8 oz (74.617 kg)   CC: UTI?  Subjective:    [name], female    DOB: June 18, 1940, 73 y.o.   MRN: [id number]  HPI: [name] [name] is a 73 y.o. female presenting on 10/12/2013 for Urinary Tract Infection   H/o recurrent UTI now on cranberry PO and doxy 100mg  daily since 06/2013 and had not had UTI in last 3 months.  Keflex and PCN allergy.  Presents with husband with with ongoing urinary symptoms. Last visit (10/02/2013) she denied any urinary sxs.  But now endorses ongoing frequency and incontinence, some spasms.  No hematuria. More malodorous urine since 07/2013.  Feels ill - malaise.  Some chills. Denies back aches or abdominal pain, nausea/vomiting.  Cranberry juice has helped spasm.   Past Medical History  Diagnosis Date  . Diverticular disease 10/09/09    CT abd no sigmoid mass DDD L5/S1  . Peptic ulcer   . GERD (gastroesophageal reflux disease)   . Hiatal hernia   . Hypertension   . Dyslipidemia   . Chronic cystitis     recurrent UTI (4+ in last year), on suppressive doxy (uro Cope)  . Hematuria     h/o kidney stones  . COPD, severe 01/27/07       . Lung cancer 1987  . Spinal abscess 2000    secondary to periodontal disease  . Osteoarthritis   . Depression with anxiety   . Osteopenia 08/2011    DEXA: L femur -1.6  . Left scapholunate ligament tear 2014    found on imaging   Relevant past medical, surgical, family and social history reviewed and updated as indicated.  Allergies and medications reviewed and updated. Current Outpatient Prescriptions on File Prior to Visit  Medication Sig  . albuterol (PROAIR HFA) 108 (90 BASE) MCG/ACT inhaler Inhale 2 puffs into the lungs every 4 (four) hours as needed.  Marland Kitchen albuterol (PROVENTIL) (2.5 MG/3ML) 0.083% nebulizer solution Take 3 mLs (2.5 mg total) by nebulization 4 (four) times daily.  . budesonide (PULMICORT) 0.25 MG/2ML nebulizer  solution Take 2 mLs (0.25 mg total) by nebulization daily.  Marland Kitchen buPROPion (WELLBUTRIN SR) 150 MG 12 hr tablet TAKE ONE TABLET BY MOUTH TWICE DAILY  . cholecalciferol (VITAMIN D) 1000 UNITS tablet Take 1,000 Units by mouth daily.  Marland Kitchen CRANBERRY PO Take 2 tablets by mouth daily.  . diphenhydrAMINE (BENADRYL) 25 mg capsule Take 2 by mouth at bedtime as needed   . guaifenesin (HUMIBID E) 400 MG TABS Take by mouth.   . loratadine (CLARITIN) 10 MG tablet Take 10 mg by mouth daily.    Marland Kitchen losartan (COZAAR) 50 MG tablet TAKE ONE TABLET BY MOUTH EVERY DAY  . metoprolol tartrate (LOPRESSOR) 25 MG tablet TAKE ONE TABLET BY MOUTH TWICE DAILY  . Multiple Vitamin (MULTIVITAMIN) tablet Take 1 tablet by mouth daily.  . NON FORMULARY Oxygen 4 liters daily and 3 liters at night  . omeprazole (PRILOSEC) 40 MG capsule TAKE ONE CAPSULE BY MOUTH EVERY DAY  . oxybutynin (DITROPAN) 5 MG tablet TAKE ONE TABLET BY MOUTH TWICE DAILY  . pravastatin (PRAVACHOL) 80 MG tablet ONE TABLET BY MOUTH AT BEDTIME  . psyllium (METAMUCIL) 58.6 % powder Take 1 packet by mouth daily.  . sertraline (ZOLOFT) 100 MG tablet Take one tablet in the morning and half tablet at night  . tiotropium (SPIRIVA) 18 MCG inhalation capsule Place  1 capsule (18 mcg total) into inhaler and inhale daily.   No current facility-administered medications on file prior to visit.    Review of Systems Per HPI unless specifically indicated above    Objective:    BP 136/78  Pulse 68  Temp(Src) 98.3 F (36.8 C) (Oral)  Wt 164 lb 8 oz (74.617 kg)  Physical Exam  Nursing note and vitals reviewed. Constitutional: She appears well-developed and well-nourished. No distress.  HENT:  O2 by [place]  Abdominal: Soft. Normal appearance and bowel sounds are normal. She exhibits no distension and no mass. There is no hepatosplenomegaly. There is no tenderness. There is no rigidity, no rebound, no guarding, no CVA tenderness and negative Murphy's sign.  Musculoskeletal: She  exhibits no edema.       Assessment & Plan:   Problem List Items Addressed This Visit   Recurrent UTI - Primary     Recurrent UTI - failure of doxy 100mg  daily ppx therapy. Will place on septra course while we await culture results. Then start septra 1/2 tablet daily for prevention. If recurrent, rec f/u with Dr. Jacqlyn Larsen.    Relevant Medications      sulfamethoxazole-trimethoprim (BACTRIM/SEPTRA DS) tablet 800-160 mg      sulfamethoxazole-trimethoprim (BACTRIM,SEPTRA) 400-80 MG per tablet   Other Relevant Orders      Urine culture    Other Visit Diagnoses   Urine frequency        Relevant Orders       POCT Urinalysis Dipstick (Completed)        Follow up plan: Return if symptoms worsen or fail to improve.

## 2013-10-12 NOTE — Assessment & Plan Note (Addendum)
Recurrent UTI - failure of doxy 100mg  daily ppx therapy. Will place on septra course while we await culture results. Then start septra 1/2 tablet daily for prevention. If recurrent, rec f/u with Dr. Jacqlyn Larsen.

## 2013-10-12 NOTE — Patient Instructions (Signed)
Stop doxycycline daily therapy. Treat UTI with bactrim course for 7 days. After treatment, start bactrim 1/2 tablet daily for prevention. If symptoms return or not improving please let me know.  Urinary Tract Infection Urinary tract infections (UTIs) can develop anywhere along your urinary tract. Your urinary tract is your body's drainage system for removing wastes and extra water. Your urinary tract includes two kidneys, two ureters, a bladder, and a urethra. Your kidneys are a pair of bean-shaped organs. Each kidney is about the size of your fist. They are located below your ribs, one on each side of your spine. CAUSES Infections are caused by microbes, which are microscopic organisms, including fungi, viruses, and bacteria. These organisms are so small that they can only be seen through a microscope. Bacteria are the microbes that most commonly cause UTIs. SYMPTOMS  Symptoms of UTIs may vary by age and gender of the patient and by the location of the infection. Symptoms in young women typically include a frequent and intense urge to urinate and a painful, burning feeling in the bladder or urethra during urination. Older women and men are more likely to be tired, shaky, and weak and have muscle aches and abdominal pain. A fever may mean the infection is in your kidneys. Other symptoms of a kidney infection include pain in your back or sides below the ribs, nausea, and vomiting. DIAGNOSIS To diagnose a UTI, your caregiver will ask you about your symptoms. Your caregiver also will ask to provide a urine sample. The urine sample will be tested for bacteria and white blood cells. White blood cells are made by your body to help fight infection. TREATMENT  Typically, UTIs can be treated with medication. Because most UTIs are caused by a bacterial infection, they usually can be treated with the use of antibiotics. The choice of antibiotic and length of treatment depend on your symptoms and the type of  bacteria causing your infection. HOME CARE INSTRUCTIONS  If you were prescribed antibiotics, take them exactly as your caregiver instructs you. Finish the medication even if you feel better after you have only taken some of the medication.  Drink enough water and fluids to keep your urine clear or pale yellow.  Avoid caffeine, tea, and carbonated beverages. They tend to irritate your bladder.  Empty your bladder often. Avoid holding urine for long periods of time.  Empty your bladder before and after sexual intercourse.  After a bowel movement, women should cleanse from front to back. Use each tissue only once. SEEK MEDICAL CARE IF:   You have back pain.  You develop a fever.  Your symptoms do not begin to resolve within 3 days. SEEK IMMEDIATE MEDICAL CARE IF:   You have severe back pain or lower abdominal pain.  You develop chills.  You have nausea or vomiting.  You have continued burning or discomfort with urination. MAKE SURE YOU:   Understand these instructions.  Will watch your condition.  Will get help right away if you are not doing well or get worse. Document Released: 02/17/2005 Document Revised: 11/09/2011 Document Reviewed: 06/18/2011 [facility] Patient Information 2014 [place].

## 2013-10-12 NOTE — Progress Notes (Signed)
Pre visit review using our clinic review tool, if applicable. No additional management support is needed unless otherwise documented below in the visit note. 

## 2013-10-15 ENCOUNTER — Other Ambulatory Visit: Payer: Self-pay | Admitting: Family Medicine

## 2013-10-15 LAB — URINE CULTURE: Colony Count: >=100000

## 2013-10-15 MED ORDER — NITROFURANTOIN MONOHYD MACRO 100 MG PO CAPS: 100.0000 mg | ORAL_CAPSULE | Freq: Two times a day (BID) | ORAL | Status: DC

## 2013-10-31 ENCOUNTER — Encounter: Payer: Self-pay | Admitting: Internal Medicine

## 2013-11-21 ENCOUNTER — Other Ambulatory Visit: Payer: Self-pay | Admitting: Family Medicine

## 2013-11-28 ENCOUNTER — Encounter: Payer: Self-pay | Admitting: Adult Health

## 2013-11-28 ENCOUNTER — Ambulatory Visit (INDEPENDENT_AMBULATORY_CARE_PROVIDER_SITE_OTHER): Payer: Medicare Other | Admitting: Adult Health

## 2013-11-28 ENCOUNTER — Telehealth: Payer: Self-pay | Admitting: Pulmonary Disease

## 2013-11-28 ENCOUNTER — Ambulatory Visit (INDEPENDENT_AMBULATORY_CARE_PROVIDER_SITE_OTHER)
Admission: RE | Admit: 2013-11-28 | Discharge: 2013-11-28 | Disposition: A | Discharge status: Home / Self Care | Payer: Medicare Other | Source: Ambulatory Visit | Attending: Adult Health | Admitting: Adult Health

## 2013-11-28 VITALS — BP 112/58 | HR 79 | Temp 98.3°F | Ht 63.0 in | Wt 163.0 lb

## 2013-11-28 DIAGNOSIS — J439 Emphysema, unspecified: Secondary | ICD-10-CM

## 2013-11-28 DIAGNOSIS — J438 Other emphysema: Secondary | ICD-10-CM

## 2013-11-28 MED ORDER — BUDESONIDE 0.25 MG/2ML IN SUSP: 0.2500 mg | Freq: Two times a day (BID) | RESPIRATORY_TRACT | Status: DC

## 2013-11-28 MED ORDER — DOXYCYCLINE HYCLATE 100 MG PO TABS: 100.0000 mg | ORAL_TABLET | Freq: Two times a day (BID) | ORAL | Status: DC

## 2013-11-28 MED ORDER — ALBUTEROL SULFATE (2.5 MG/3ML) 0.083% IN NEBU: 2.5000 mg | INHALATION_SOLUTION | Freq: Four times a day (QID) | RESPIRATORY_TRACT | Status: DC

## 2013-11-28 NOTE — Patient Instructions (Signed)
Doxycycline 100mg  Twice daily  For 7 days  Mucinex DM Twice daily  As needed  Cough/congestion  Continue on Pulmicort Neb Twice daily  , Albuterol Neb Four times a day   And spiriva daily  We will send nebs to [facility] to see if cheaper for you.  follow up Dr. Lake Bells in [place] in 6-8 weeks and As needed   Chest xray today  Please contact office for sooner follow up if symptoms do not improve or worsen or seek emergency care

## 2013-11-28 NOTE — Addendum Note (Signed)
Addended by: Maurice March on: 11/28/2013 12:47 PM   Modules accepted: Orders

## 2013-11-28 NOTE — Progress Notes (Signed)
   Subjective:    [name], female    DOB: 22-Nov-1940, 73 y.o.   MRN: [id number]  HPI Pt has a hx of gold stage IV COPD  She uses 4 L of oxygen with exertion and 3 L at rest. She participates in pulmonary rehabilitation at [facility].     11/28/2013 Acute OV  Complains of productive cough with green mucous. Pt c/o headache, sore throat and CP when coughing. S/s x 1 week.  No hemoptysis , chest pian , orthopnea, edema or recent travel or abx use.  No cxr since 2013.  Doing well until last week  On O2 at 3l/m rest and 4l/m walking.  No ER/Hospital visits since last ov.  Says her nebs are very expensive at pharmacy .  On pulmicort neb Twice daily  , albuterol neb Four times a day  And spiriva daily    Review of Systems Constitutional:   No  weight loss, night sweats,  Fevers, chills, +fatigue, or  lassitude.  HEENT:   No headaches,  Difficulty swallowing,  Tooth/dental problems, or  Sore throat,                No sneezing, itching, ear ache,  +nasal congestion, post nasal drip,   CV:  No chest pain,  Orthopnea, PND, swelling in lower extremities, anasarca, dizziness, palpitations, syncope.   GI  No heartburn, indigestion, abdominal pain, nausea, vomiting, diarrhea, change in bowel habits, loss of appetite, bloody stools.   Resp:   No chest wall deformity  Skin: no rash or lesions.  GU: no dysuria, change in color of urine, no urgency or frequency.  No flank pain, no hematuria   MS:  No joint pain or swelling.  No decreased range of motion.  No back pain.  Psych:  No change in mood or affect. No depression or anxiety.  No memory loss.         Objective:   Physical Exam GEN: A/Ox3; pleasant , NAD, elderly   HEENT:  [place]/AT,  EACs-clear, TMs-wnl, NOSE-clear, THROAT-clear, no lesions, no postnasal drip or exudate noted.   NECK:  Supple w/ fair ROM; no JVD; normal carotid impulses w/o bruits; no thyromegaly or nodules palpated; no  lymphadenopathy.  RESP  ?Decreased in bases w/o, wheezes/ rales/ or rhonchi.no accessory muscle use, no dullness to percussion  CARD:  RRR, no m/r/g  , no peripheral edema, pulses intact, no cyanosis or clubbing.  GI:   Soft & nt; nml bowel sounds; no organomegaly or masses detected.  Musco: Warm bil, no deformities or joint swelling noted.   Neuro: alert, no focal deficits noted.    Skin: Warm, no lesions or rashes         Assessment & Plan:

## 2013-11-28 NOTE — Progress Notes (Signed)
I agree with above 

## 2013-11-28 NOTE — Assessment & Plan Note (Addendum)
Flare   Plan  Doxycycline 100mg  Twice daily  For 7 days -advised to avoid sun exposure d/t burn potential Mucinex DM Twice daily  As needed  Cough/congestion  Continue on Pulmicort Neb Twice daily  , Albuterol Neb Four times a day  And spiriva daily  We will send nebs to [facility] to see if cheaper for you.  follow up Dr. Lake Bells in [name] in 6-8 weeks and As needed   Chest xray today  Please contact office for sooner follow up if symptoms do not improve or worsen or seek emergency care

## 2013-11-28 NOTE — Telephone Encounter (Signed)
Called spoke with pt. appt scheduled for her to come in and see TP at 12PM in [place] office. Nothing further needed

## 2013-11-29 NOTE — Progress Notes (Signed)
Quick Note:  Called spoke with patient and advised of cxr results / recs as stated by TP. Pt verbalized her understanding and denied any questions / concerns at this time. ______

## 2013-12-07 ENCOUNTER — Other Ambulatory Visit: Payer: Self-pay | Admitting: Family Medicine

## 2013-12-13 ENCOUNTER — Telehealth: Payer: Self-pay | Admitting: Pulmonary Disease

## 2013-12-13 MED ORDER — AZITHROMYCIN 250 MG PO TABS: ORAL_TABLET | ORAL | Status: DC

## 2013-12-13 NOTE — Telephone Encounter (Signed)
Offer Zpak 

## 2013-12-13 NOTE — Telephone Encounter (Signed)
Pt aware of recs. RX called in. Nothing further needed 

## 2013-12-13 NOTE — Telephone Encounter (Signed)
Per OV 11/28/13 w/ TP: Patient Instructions      Doxycycline 100mg  Twice daily  For 7 days   Mucinex DM Twice daily  As needed  Cough/congestion   Continue on Pulmicort Neb Twice daily  , Albuterol Neb Four times a day    And spiriva daily   We will send nebs to [facility] to see if cheaper for you.   follow up Dr. Lake Bells in [place] in 6-8 weeks and As needed    Chest xray today   Please contact office for sooner follow up if symptoms do not improve or worsen or seek emergen  ----  Called spoke with pt. She reports she started coughing up green phlem x this AM. Also c/o PND, nasal congestion, slight wheezing. No chest tx, no f/c/s/n/v. She is taking benadryl at night. She wants something called in ASAP. Pt is of BQ and is 3PM elink but pt would like something called in sooner. Pt has finished all meds TP rx'd for her. Please advise CDY thanks  Allergies  Allergen Reactions  . Keflex [Cephalexin] Shortness Of Breath  . Penicillins     REACTION: hives     Current Outpatient Prescriptions on File Prior to Visit  Medication Sig Dispense Refill  . albuterol (PROAIR HFA) 108 (90 BASE) MCG/ACT inhaler Inhale 2 puffs into the lungs every 4 (four) hours as needed.  1 Inhaler  0  . albuterol (PROVENTIL) (2.5 MG/3ML) 0.083% nebulizer solution Take 3 mLs (2.5 mg total) by nebulization 4 (four) times daily.  75 mL  12  . budesonide (PULMICORT) 0.25 MG/2ML nebulizer solution Take 2 mLs (0.25 mg total) by nebulization 2 (two) times daily.  60 mL  12  . buPROPion (WELLBUTRIN SR) 150 MG 12 hr tablet TAKE ONE TABLET BY MOUTH TWICE DAILY  60 tablet  3  . CRANBERRY PO Take 2 tablets by mouth daily.      . diphenhydrAMINE (BENADRYL) 25 mg capsule Take 2 by mouth at bedtime as needed       . doxycycline (VIBRA-TABS) 100 MG tablet Take 1 tablet (100 mg total) by mouth 2 (two) times daily.  14 tablet  0  . guaifenesin (HUMIBID E) 400 MG TABS Take by mouth.       . loratadine (CLARITIN) 10 MG tablet Take 10 mg  by mouth daily.        Marland Kitchen losartan (COZAAR) 50 MG tablet TAKE ONE TABLET BY MOUTH EVERY DAY  30 tablet  3  . metoprolol tartrate (LOPRESSOR) 25 MG tablet TAKE ONE TABLET BY MOUTH TWICE DAILY  60 tablet  11  . Multiple Vitamin (MULTIVITAMIN) tablet Take 1 tablet by mouth daily.      . NON FORMULARY Oxygen 4 liters daily and 3 liters at night      . omeprazole (PRILOSEC) 40 MG capsule Take 1 capsule (40 mg total) by mouth daily.  30 capsule  4  . oxybutynin (DITROPAN) 5 MG tablet TAKE ONE TABLET BY MOUTH TWICE DAILY  60 tablet  3  . pravastatin (PRAVACHOL) 80 MG tablet ONE TABLET BY MOUTH AT BEDTIME  30 tablet  3  . psyllium (METAMUCIL) 58.6 % powder Take 1 packet by mouth daily.      . sertraline (ZOLOFT) 100 MG tablet Take one tablet in the morning and half tablet at night  45 tablet  9  . tiotropium (SPIRIVA) 18 MCG inhalation capsule Place 1 capsule (18 mcg total) into inhaler and inhale daily.  [place]  capsule  5   No current facility-administered medications on file prior to visit.

## 2014-01-29 ENCOUNTER — Other Ambulatory Visit: Payer: Self-pay | Admitting: Family Medicine

## 2014-02-05 ENCOUNTER — Ambulatory Visit (INDEPENDENT_AMBULATORY_CARE_PROVIDER_SITE_OTHER): Payer: Medicare Other | Admitting: Pulmonary Disease

## 2014-02-05 ENCOUNTER — Encounter: Payer: Self-pay | Admitting: Pulmonary Disease

## 2014-02-05 ENCOUNTER — Other Ambulatory Visit: Payer: Self-pay | Admitting: Family Medicine

## 2014-02-05 VITALS — BP 124/64 | HR 64 | Ht 63.0 in | Wt 158.0 lb

## 2014-02-05 DIAGNOSIS — J3089 Other allergic rhinitis: Secondary | ICD-10-CM

## 2014-02-05 DIAGNOSIS — J309 Allergic rhinitis, unspecified: Secondary | ICD-10-CM | POA: Insufficient documentation

## 2014-02-05 DIAGNOSIS — J438 Other emphysema: Secondary | ICD-10-CM

## 2014-02-05 DIAGNOSIS — J9611 Chronic respiratory failure with hypoxia: Secondary | ICD-10-CM

## 2014-02-05 DIAGNOSIS — Z22322 Carrier or suspected carrier of Methicillin resistant Staphylococcus aureus: Secondary | ICD-10-CM | POA: Insufficient documentation

## 2014-02-05 DIAGNOSIS — J9610 Chronic respiratory failure, unspecified whether with hypoxia or hypercapnia: Secondary | ICD-10-CM

## 2014-02-05 DIAGNOSIS — R0902 Hypoxemia: Secondary | ICD-10-CM

## 2014-02-05 MED ORDER — MUPIROCIN CALCIUM 2 % NA OINT: 1.0000 "application " | TOPICAL_OINTMENT | Freq: Two times a day (BID) | NASAL | Status: DC

## 2014-02-05 NOTE — Assessment & Plan Note (Signed)
This is been a stable interval since she last saw Tammy.  Plan: -Continue Pulmicort, Spiriva, and as needed albuterol -She will get a flu shot on September 26

## 2014-02-05 NOTE — Assessment & Plan Note (Signed)
She describes painful sinus lesions which are characteristic of MRSA. She has been told with hospitalizations in the past but she is a colonizer of this.  Plan: -Bactroban ointment for 5 days

## 2014-02-05 NOTE — Assessment & Plan Note (Signed)
Advised to use 3 L continuously because when we walked her on 3 L today her oxygen saturation remained above 92% after 500 feet.

## 2014-02-05 NOTE — Patient Instructions (Addendum)
Use 3 L O2 continuously Let us know when you want to start pulmonary rehab again Use the bactroban ointment twice a day for 5 days After finishing the bactroban, start taking nasacort 2 sprays each nostril daily Keep taking your other medicines as you are doing Sign up for the flu shot clinic We will see you back in 3 months or sooner if needed

## 2014-02-05 NOTE — Progress Notes (Signed)
Subjective:    [name], female    DOB: 03-Oct-1940, 73 y.o.   MRN: [id number]  Synopsis: [name] [name] has gold stage IV COPD and has followed with Dr. Halford Chessman in [place] since around 2007. She uses 4 L of oxygen with exertion and 3 L at rest. She participates in pulmonary rehabilitation at [facility]. Because she lives in [place] she switched her care to Dr. Lake Bells in may of 2014.  HPI   02/05/14 ROV > [name] is doing better since our last visit back in July.  She has been experiencing a lot of itchy eyes and watery runny nose symptoms.  She has a nose sore which has been bothering her. She still has dyspnea on exertion which is pretty bad when she is going hard. She knows when to pace her.  Rare cough productive of green sputum.  She takes benadryl at night and claritiin in the daytime.   She has a bad itching on her arms which is better with the benadryl.  Her husband has been sick lately so she has not been able to do much for herself lately.  She is not exercising.    Past Medical History  Diagnosis Date  . Diverticular disease 10/09/09    CT abd no sigmoid mass DDD L5/S1  . Peptic ulcer   . GERD (gastroesophageal reflux disease)   . Hiatal hernia   . Hypertension   . Dyslipidemia   . Chronic cystitis     recurrent UTI (4+ in last year), on suppressive doxy (uro Cope)  . Hematuria     h/o kidney stones  . COPD, severe 01/27/07       . Lung cancer 1987  . Spinal abscess 2000    secondary to periodontal disease  . Osteoarthritis   . Depression with anxiety   . Osteopenia 08/2011    DEXA: L femur -1.6  . Left scapholunate ligament tear 2014    found on imaging        Review of Systems  Constitutional: Negative for fever, chills and unexpected weight change.  HENT: Positive for postnasal drip, sore throat and voice change. Negative for congestion, dental problem, ear pain, nosebleeds, rhinorrhea, sinus pressure, sneezing and  trouble swallowing.   Respiratory: Positive for cough and shortness of breath. Negative for choking.   Cardiovascular: Negative for chest pain and leg swelling.       Objective:   Physical Exam  Filed Vitals:   02/05/14 1407  BP: 124/64  Pulse: 64  Height: 5\' 3"  (1.6 m)  Weight: 158 lb (71.668 kg)  SpO2: 96%  On 3 L [place]  Walked 500 feet on 3 L nasal cannula in clinic in her O2 saturation remained above 92%  Gen: well appearing, no acute distress HEENT: NCAT,  EOMi, OP clear,  PULM: Clear to auscultation bilaterally CV: RRR, no mgr, no JVD AB: BS+, soft, nontender, no hsm Ext: warm, no edema, no clubbing, no cyanosis      Assessment & Plan:   GOLD 4 COPD with emphysema This is been a stable interval since she last saw Tammy.  Plan: -Continue Pulmicort, Spiriva, and as needed albuterol -She will get a flu shot on September 26  Chronic respiratory failure with hypoxia Advised to use 3 L continuously because when we walked her on 3 L today her oxygen saturation remained above 92% after 500 feet.  MRSA colonization She describes painful sinus lesions which are characteristic of MRSA.  She has been told with hospitalizations in the past but she is a colonizer of this.  Plan: -Bactroban ointment for 5 days  Allergic rhinitis After she completes the Bactroban ointment she needs to start using Flonase or Nasacort regularly in addition to an over-the-counter antihistamine and decongestant. I recommended Zyrtec.    Updated Medication List Outpatient Encounter Prescriptions as of 02/05/2014  Medication Sig  . albuterol (PROAIR HFA) 108 (90 BASE) MCG/ACT inhaler Inhale 2 puffs into the lungs every 4 (four) hours as needed.  Marland Kitchen albuterol (PROVENTIL) (2.5 MG/3ML) 0.083% nebulizer solution Take 3 mLs (2.5 mg total) by nebulization 4 (four) times daily.  Marland Kitchen azithromycin (ZITHROMAX) 250 MG tablet Take as directed  . budesonide (PULMICORT) 0.25 MG/2ML nebulizer solution Take 2 mLs  (0.25 mg total) by nebulization 2 (two) times daily.  Marland Kitchen buPROPion (WELLBUTRIN SR) 150 MG 12 hr tablet TAKE ONE TABLET BY MOUTH TWICE DAILY  . CRANBERRY PO Take 2 tablets by mouth daily.  . diphenhydrAMINE (BENADRYL) 25 mg capsule Take 2 by mouth at bedtime as needed   . guaifenesin (HUMIBID E) 400 MG TABS Take by mouth.   . loratadine (CLARITIN) 10 MG tablet Take 10 mg by mouth daily.    Marland Kitchen losartan (COZAAR) 50 MG tablet TAKE ONE TABLET BY MOUTH EVERY DAY  . metoprolol tartrate (LOPRESSOR) 25 MG tablet TAKE ONE TABLET BY MOUTH TWICE DAILY  . Multiple Vitamin (MULTIVITAMIN) tablet Take 1 tablet by mouth daily.  . NON FORMULARY Oxygen 4 liters daily and 3 liters at night  . omeprazole (PRILOSEC) 40 MG capsule Take 1 capsule (40 mg total) by mouth daily.  Marland Kitchen oxybutynin (DITROPAN) 5 MG tablet TAKE ONE TABLET BY MOUTH TWICE DAILY  . pravastatin (PRAVACHOL) 80 MG tablet ONE TABLET BY MOUTH AT BEDTIME  . psyllium (METAMUCIL) 58.6 % powder Take 1 packet by mouth daily.  . sertraline (ZOLOFT) 100 MG tablet TAKE ONE AND ONE-HALF TABLETS AT BEDTIME  . tiotropium (SPIRIVA) 18 MCG inhalation capsule Place 1 capsule (18 mcg total) into inhaler and inhale daily.  . [DISCONTINUED] doxycycline (VIBRA-TABS) 100 MG tablet Take 1 tablet (100 mg total) by mouth 2 (two) times daily.

## 2014-02-05 NOTE — Assessment & Plan Note (Signed)
After she completes the Bactroban ointment she needs to start using Flonase or Nasacort regularly in addition to an over-the-counter antihistamine and decongestant. I recommended Zyrtec.

## 2014-02-13 ENCOUNTER — Other Ambulatory Visit: Payer: Self-pay | Admitting: Family Medicine

## 2014-02-13 ENCOUNTER — Other Ambulatory Visit (INDEPENDENT_AMBULATORY_CARE_PROVIDER_SITE_OTHER): Payer: Medicare Other

## 2014-02-13 DIAGNOSIS — R5383 Other fatigue: Secondary | ICD-10-CM

## 2014-02-13 DIAGNOSIS — I10 Essential (primary) hypertension: Secondary | ICD-10-CM

## 2014-02-13 DIAGNOSIS — E785 Hyperlipidemia, unspecified: Secondary | ICD-10-CM

## 2014-02-13 DIAGNOSIS — R7989 Other specified abnormal findings of blood chemistry: Secondary | ICD-10-CM

## 2014-02-13 DIAGNOSIS — R5381 Other malaise: Secondary | ICD-10-CM

## 2014-02-13 LAB — CBC WITH DIFFERENTIAL/PLATELET
BASOS PCT: 0.4 % (ref 0.0–3.0)
Basophils Absolute: 0 10*3/uL (ref 0.0–0.1)
EOS ABS: 0.2 10*3/uL (ref 0.0–0.7)
Eosinophils Relative: 3.1 % (ref 0.0–5.0)
HCT: 41.1 % (ref 36.0–46.0)
Hemoglobin: 13.4 g/dL (ref 12.0–15.0)
LYMPHS ABS: 2.2 10*3/uL (ref 0.7–4.0)
Lymphocytes Relative: 31.1 % (ref 12.0–46.0)
MCHC: 32.7 g/dL (ref 30.0–36.0)
MCV: 91.9 fl (ref 78.0–100.0)
Monocytes Absolute: 0.4 10*3/uL (ref 0.1–1.0)
Monocytes Relative: 5.8 % (ref 3.0–12.0)
NEUTROS ABS: 4.3 10*3/uL (ref 1.4–7.7)
Neutrophils Relative %: 59.6 % (ref 43.0–77.0)
Platelets: 162 10*3/uL (ref 150.0–400.0)
RBC: 4.47 Mil/uL (ref 3.87–5.11)
RDW: 14.1 % (ref 11.5–15.5)
WBC: 7.2 10*3/uL (ref 4.0–10.5)

## 2014-02-13 LAB — BASIC METABOLIC PANEL
BUN: 16 mg/dL (ref 6–23)
CALCIUM: 9.4 mg/dL (ref 8.4–10.5)
CO2: 30 mEq/L (ref 19–32)
Chloride: 102 mEq/L (ref 96–112)
Creatinine, Ser: 1 mg/dL (ref 0.4–1.2)
GFR: 60.57 mL/min (ref >60.00)
Glucose, Bld: 117 mg/dL — ABNORMAL HIGH (ref 70–99)
Potassium: 4.3 mEq/L (ref 3.5–5.1)
Sodium: 139 mEq/L (ref 135–145)

## 2014-02-13 LAB — LIPID PANEL
Cholesterol: 219 mg/dL — ABNORMAL HIGH (ref 0–200)
HDL: 71.3 mg/dL (ref >39.00)
LDL CALC: 108 mg/dL — AB (ref 0–99)
NONHDL: 147.7
Total CHOL/HDL Ratio: 3
Triglycerides: 200 mg/dL — ABNORMAL HIGH (ref 0.0–149.0)
VLDL: 40 mg/dL (ref 0.0–40.0)

## 2014-02-13 LAB — HEMOGLOBIN A1C: Hgb A1c MFr Bld: 5.5 % (ref 4.6–6.5)

## 2014-02-13 LAB — TSH: TSH: 1.53 u[IU]/mL (ref 0.35–4.50)

## 2014-02-18 ENCOUNTER — Encounter: Payer: Self-pay | Admitting: Family Medicine

## 2014-02-18 ENCOUNTER — Ambulatory Visit (INDEPENDENT_AMBULATORY_CARE_PROVIDER_SITE_OTHER): Payer: Medicare Other | Admitting: Family Medicine

## 2014-02-18 VITALS — BP 120/68 | HR 55 | Temp 97.9°F | Resp 16

## 2014-02-18 DIAGNOSIS — D126 Benign neoplasm of colon, unspecified: Secondary | ICD-10-CM

## 2014-02-18 DIAGNOSIS — M899 Disorder of bone, unspecified: Secondary | ICD-10-CM

## 2014-02-18 DIAGNOSIS — N3941 Urge incontinence: Secondary | ICD-10-CM

## 2014-02-18 DIAGNOSIS — Z7189 Other specified counseling: Secondary | ICD-10-CM | POA: Insufficient documentation

## 2014-02-18 DIAGNOSIS — J438 Other emphysema: Secondary | ICD-10-CM

## 2014-02-18 DIAGNOSIS — Z23 Encounter for immunization: Secondary | ICD-10-CM

## 2014-02-18 DIAGNOSIS — I10 Essential (primary) hypertension: Secondary | ICD-10-CM

## 2014-02-18 DIAGNOSIS — Z0000 Encounter for general adult medical examination without abnormal findings: Secondary | ICD-10-CM

## 2014-02-18 DIAGNOSIS — M8580 Other specified disorders of bone density and structure, unspecified site: Secondary | ICD-10-CM

## 2014-02-18 DIAGNOSIS — M949 Disorder of cartilage, unspecified: Secondary | ICD-10-CM

## 2014-02-18 DIAGNOSIS — E785 Hyperlipidemia, unspecified: Secondary | ICD-10-CM

## 2014-02-18 LAB — POCT URINALYSIS DIPSTICK
Bilirubin, UA: NEGATIVE
Blood, UA: NEGATIVE
Glucose, UA: NEGATIVE
KETONES UA: NEGATIVE
Leukocytes, UA: NEGATIVE
NITRITE UA: NEGATIVE
PH UA: 6
PROTEIN UA: NEGATIVE
Spec Grav, UA: 1.015
Urobilinogen, UA: 0.2

## 2014-02-18 MED ORDER — OXYBUTYNIN CHLORIDE 5 MG PO TABS: ORAL_TABLET | ORAL | Status: DC

## 2014-02-18 NOTE — Assessment & Plan Note (Signed)
UA today WNL Suggested trial of oxybutynin. Did not respond to myrbetriq.

## 2014-02-18 NOTE — Assessment & Plan Note (Signed)
Continue 4L O2 by [place]. Flu shot today. prevnar today.

## 2014-02-18 NOTE — Assessment & Plan Note (Signed)
rec start calcium and vit D.

## 2014-02-18 NOTE — Assessment & Plan Note (Signed)

## 2014-02-18 NOTE — Assessment & Plan Note (Signed)
Discussed FLP. Continue pravastatin.

## 2014-02-18 NOTE — Assessment & Plan Note (Signed)
Advanced directed - would want HCPOA to be husband, Karlton Lemon. Does not have set up - requests handout.

## 2014-02-18 NOTE — Progress Notes (Signed)
BP 120/68  Pulse 55  Temp(Src) 97.9 F (36.6 C) (Oral)  Resp 16  SpO2 96%   CC: medicare wellness  Subjective:    [name], female    DOB: March 04, 1941, 73 y.o.   MRN: [id number]  HPI: [name] [name] is a 73 y.o. female presenting on 02/18/2014 for Annual Exam   Increased urgency incontinence. UA checked today and normal. Not on ppx abx.  No falls in last year  Denies depression/anhedonia/sadness. Hearing screen passed.  Vision screen today.  Preventative: Colonoscopy - 09/2009 with 3 adenomatous polyps, rec rpt 3 yrs. (Dr. Henrene Pastor)  Well woman - has stopped.  Mammogram - in 10/2012 , will call to schedule DEXA Date: 08/2011 T femur -1.6, T spine -0.6 Flu today Td 2010  Pneumonia 2010 , prevnar today Shingles - 10/2012  Advanced directed - would want HCPOA to be husband, Karlton Lemon. Does not have set up - requests handout.  Caffeine: 4 cups coffee  Remarried for the third time, 2nd husband died of heart attack. Two stepsons  Activity: Walks for exercise  Diet: good water, fruits/vegetables daily   Relevant past medical, surgical, family and social history reviewed and updated as indicated.  Allergies and medications reviewed and updated. Current Outpatient Prescriptions on File Prior to Visit  Medication Sig  . albuterol (PROAIR HFA) 108 (90 BASE) MCG/ACT inhaler Inhale 2 puffs into the lungs every 4 (four) hours as needed.  Marland Kitchen albuterol (PROVENTIL) (2.5 MG/3ML) 0.083% nebulizer solution Take 3 mLs (2.5 mg total) by nebulization 4 (four) times daily.  . budesonide (PULMICORT) 0.25 MG/2ML nebulizer solution Take 2 mLs (0.25 mg total) by nebulization 2 (two) times daily.  Marland Kitchen buPROPion (WELLBUTRIN SR) 150 MG 12 hr tablet TAKE ONE TABLET BY MOUTH TWICE DAILY  . CRANBERRY PO Take 2 tablets by mouth daily.  . diphenhydrAMINE (BENADRYL) 25 mg capsule Take 2 by mouth at bedtime as needed   . guaifenesin (HUMIBID E) 400 MG TABS Take by mouth.   . loratadine  (CLARITIN) 10 MG tablet Take 10 mg by mouth daily.    Marland Kitchen losartan (COZAAR) 50 MG tablet TAKE ONE TABLET BY MOUTH EVERY DAY  . metoprolol tartrate (LOPRESSOR) 25 MG tablet TAKE ONE TABLET BY MOUTH TWICE DAILY  . Multiple Vitamin (MULTIVITAMIN) tablet Take 1 tablet by mouth daily.  . mupirocin nasal ointment (BACTROBAN NASAL) 2 % Place 1 application into the nose 2 (two) times daily. Use one-half of tube in each nostril twice daily for five (5) days. After application, press sides of nose together and gently massage.  . NON FORMULARY Oxygen 4 liters daily and 3 liters at night  . omeprazole (PRILOSEC) 40 MG capsule Take 1 capsule (40 mg total) by mouth daily.  . pravastatin (PRAVACHOL) 80 MG tablet ONE TABLET BY MOUTH AT BEDTIME  . psyllium (METAMUCIL) 58.6 % powder Take 1 packet by mouth daily.  . sertraline (ZOLOFT) 100 MG tablet TAKE ONE AND ONE-HALF TABLETS AT BEDTIME  . tiotropium (SPIRIVA) 18 MCG inhalation capsule Place 1 capsule (18 mcg total) into inhaler and inhale daily.   No current facility-administered medications on file prior to visit.    Review of Systems Per HPI unless specifically indicated above    Objective:    BP 120/68  Pulse 55  Temp(Src) 97.9 F (36.6 C) (Oral)  Resp 16  SpO2 96%  Physical Exam  Nursing note and vitals reviewed. Constitutional: She is oriented to person, place, and time. She appears  well-developed and well-nourished. No distress.  4L O2 by [place] in place  HENT:  Head: Normocephalic and atraumatic.  Right Ear: Hearing, tympanic membrane, external ear and ear canal normal.  Left Ear: Hearing, tympanic membrane, external ear and ear canal normal.  Nose: Nose normal.  Mouth/Throat: Uvula is midline, oropharynx is clear and moist and mucous membranes are normal. No oropharyngeal exudate, posterior oropharyngeal edema or posterior oropharyngeal erythema.  Eyes: Conjunctivae and EOM are normal. Pupils are equal, round, and reactive to light. No scleral  icterus.  Neck: Normal range of motion. Neck supple. Carotid bruit is not present. No thyromegaly present.  Cardiovascular: Normal rate, regular rhythm, normal heart sounds and intact distal pulses.   No murmur heard. Pulses:      Radial pulses are 2+ on the right side, and 2+ on the left side.  Pulmonary/Chest: Effort normal and breath sounds normal. No respiratory distress. She has no wheezes. She has no rales.  Abdominal: Soft. Bowel sounds are normal. She exhibits no distension and no mass. There is no tenderness. There is no rebound and no guarding.  Musculoskeletal: Normal range of motion. She exhibits no edema.  Lymphadenopathy:    She has no cervical adenopathy.  Neurological: She is alert and oriented to person, place, and time.  CN grossly intact, station and gait intact Recall 3/3 Calculation 3/5 serial 3s  Skin: Skin is warm and dry. No rash noted.  Psychiatric: She has a normal mood and affect. Her behavior is normal. Judgment and thought content normal.   Results for orders placed in visit on 02/18/14  POCT URINALYSIS DIPSTICK      Result Value Ref Range   Color, UA yellow     Clarity, UA clear     Glucose, UA neg     Bilirubin, UA neg     Ketones, UA neg     Spec Grav, UA 1.015     Blood, UA neg     pH, UA 6.0     Protein, UA neg     Urobilinogen, UA 0.2     Nitrite, UA neg     Leukocytes, UA Negative        Assessment & Plan:   Problem List Items Addressed This Visit   Urinary incontinence     UA today WNL Suggested trial of oxybutynin. Did not respond to myrbetriq.    Relevant Medications      oxybutynin (DITROPAN) tablet 5 mg   Osteopenia     rec start calcium and vit D.    Medicare annual wellness visit, subsequent - Primary     I have personally reviewed the Medicare Annual Wellness questionnaire and have noted 1. The patient's medical and social history 2. Their use of alcohol, tobacco or illicit drugs 3. Their current medications and  supplements 4. The patient's functional ability including ADL's, fall risks, home safety risks and hearing or visual impairment. 5. Diet and physical activity 6. Evidence for depression or mood disorders The patients weight, height, BMI have been recorded in the chart.  Hearing and vision has been addressed. I have made referrals, counseling and provided education to the patient based review of the above and I have provided the pt with a written personalized care plan for preventive services. Provider list updated - see scanned questionairre.  Reviewed preventative protocols and updated unless pt declined.    HYPERTENSION     Chronic, stable. Continue regimen.    GOLD 4 COPD with emphysema  Continue 4L O2 by [place]. Flu shot today. prevnar today.    Dyslipidemia     Discussed FLP. Continue pravastatin.    Advanced care planning/counseling discussion     Advanced directed - would want HCPOA to be husband, Karlton Lemon. Does not have set up - requests handout.     Other Visit Diagnoses   Dysuria        Relevant Orders       POCT Urinalysis Dipstick (Completed)    Adenomatous polyp of colon        Relevant Orders       Ambulatory referral to Gastroenterology    Need for prophylactic vaccination and inoculation against influenza        Relevant Orders       Flu Vaccine QUAD 36+ mos PF IM (Fluarix Quad PF) (Completed)    Need for vaccination with 13-polyvalent pneumococcal conjugate vaccine        Relevant Orders       Pneumococcal conjugate vaccine 13-valent (Completed)        Follow up plan: Return in about 6 months (around 08/19/2014), or as needed, for follow up visit.

## 2014-02-18 NOTE — Progress Notes (Signed)
Pre visit review using our clinic review tool, if applicable. No additional management support is needed unless otherwise documented below in the visit note. 

## 2014-02-18 NOTE — Patient Instructions (Addendum)
Flu shot today, prevnar today. [place] today. We will refer you to GI for repeat colonoscopy. Call to schedule mammogram. Start calcium with vit D 600/400mg  one to two daily. Advanced directive handout provided today.

## 2014-02-18 NOTE — Assessment & Plan Note (Signed)
Chronic, stable. Continue regimen. 

## 2014-02-21 ENCOUNTER — Encounter: Payer: Self-pay | Admitting: Internal Medicine

## 2014-03-19 ENCOUNTER — Ambulatory Visit (INDEPENDENT_AMBULATORY_CARE_PROVIDER_SITE_OTHER): Payer: Medicare Other | Admitting: Internal Medicine

## 2014-03-19 ENCOUNTER — Encounter: Payer: Self-pay | Admitting: Internal Medicine

## 2014-03-19 ENCOUNTER — Telehealth: Payer: Self-pay | Admitting: Family Medicine

## 2014-03-19 VITALS — BP 126/78 | HR 93 | Temp 97.7°F | Wt 160.0 lb

## 2014-03-19 DIAGNOSIS — N390 Urinary tract infection, site not specified: Secondary | ICD-10-CM

## 2014-03-19 DIAGNOSIS — R300 Dysuria: Secondary | ICD-10-CM

## 2014-03-19 MED ORDER — CIPROFLOXACIN HCL 500 MG PO TABS: 500.0000 mg | ORAL_TABLET | Freq: Two times a day (BID) | ORAL | Status: DC

## 2014-03-19 NOTE — Progress Notes (Signed)
HPI  Pt presents to the clinic today with c/o dysuria and fatigue. She reports this started 1 week ago. She denies fever, chills or low back pain. She has tried cranberry tablets OTC with some relief. She does have a history of chronic cystitis with recurrent UTI's.   Review of Systems  Past Medical History  Diagnosis Date  . Diverticular disease 10/09/09    CT abd no sigmoid mass DDD L5/S1  . Peptic ulcer   . GERD (gastroesophageal reflux disease)   . Hiatal hernia   . Hypertension   . Dyslipidemia   . Chronic cystitis     recurrent UTI (4+ in last year), on suppressive doxy (uro Cope)  . Hematuria     h/o kidney stones  . COPD, severe 01/27/07       . Lung cancer 1987  . Spinal abscess 2000    secondary to periodontal disease  . Osteoarthritis   . Depression with anxiety   . Osteopenia 08/2011    DEXA: L femur -1.6  . Left scapholunate ligament tear 2014    found on imaging    Family History  Problem Relation Age of Onset  . Pancreatitis Mother   . Heart disease Father   . Hypertension Father   . Emphysema Father   . Arthritis Brother   . Hypertension Brother   . Cancer Maternal Aunt     ovarian cancer    History   Social History  . Marital Status: Married    Spouse Name: N/A    Number of Children: 2  . Years of Education: N/A   Occupational History  . Retired     prior Glass blower/designer at [employer] History Main Topics  . Smoking status: Former Smoker -- 3.00 packs/day for 34 years    Types: Cigarettes    Quit date: 05/29/1995  . Smokeless tobacco: Never Used  . Alcohol Use: Yes     Comment: wine daily 2-3 glasses/day  . Drug Use: No  . Sexual Activity: Not on file   Other Topics Concern  . Not on file   Social History Narrative   Caffeine: 4 cups coffee   Remarried for the third time, 2nd husband died of heart attack. Two stepsons   Activity: Walks for exercise   Diet: good water, fruits/vegetables daily      Advanced  directed - would want HCPOA to be husband, Karlton Lemon. Does not have set up - requests handout.    Allergies  Allergen Reactions  . Keflex [Cephalexin] Shortness Of Breath  . Penicillins     REACTION: hives    Constitutional: Denies fever, malaise, fatigue, headache or abrupt weight changes.   GU: Pt reports urgency, frequency and pain with urination. Denies burning sensation, blood in urine, odor or discharge. Skin: Denies redness, rashes, lesions or ulcercations.   No other specific complaints in a complete review of systems (except as listed in HPI above).    Objective:   Physical Exam  BP 126/78  Pulse 93  Temp(Src) 97.7 F (36.5 C) (Oral)  Wt 160 lb (72.576 kg)  SpO2 98% Wt Readings from Last 3 Encounters:  03/19/14 160 lb (72.576 kg)  02/05/14 158 lb (71.668 kg)  11/28/13 163 lb (73.936 kg)    General: Appears her stated age, well developed, well nourished in NAD. Cardiovascular: Normal rate and rhythm. S1,S2 noted.  No murmur, rubs or gallops noted.  Pulmonary/Chest: Normal effort and positive vesicular breath sounds. No respiratory  distress. No wheezes, rales or ronchi noted.  Abdomen: Soft and nontender. Normal bowel sounds, no bruits noted. No distention or masses noted. Liver, spleen and kidneys non palpable. No CVA tenderness.      Assessment & Plan:   Urgency, Frequency, Dysuria secondary to UTI  Urinalysis: 3+ leuks, pos blood Will send urine culture eRx sent if for Cipro 500 mg BID x 7 days (she reports 5 days in never long enough) OK to take AZO OTC Drink plenty of fluids  RTC as needed or if symptoms persist.

## 2014-03-19 NOTE — Progress Notes (Signed)
Pre visit review using our clinic review tool, if applicable. No additional management support is needed unless otherwise documented below in the visit note. 

## 2014-03-19 NOTE — Patient Instructions (Signed)

## 2014-03-19 NOTE — Telephone Encounter (Signed)
Patient Information:  Caller Name: Loralie  Phone: [phone]  Patient: [name] [name], [name] [name]  Gender: Female  DOB: 07/11/1940  Age: 73 Years  PCP: Ria [facility])  Office Follow Up:  Does the office need to follow up with this patient?: No  Instructions For The Office: N/A   Symptoms  Reason For Call & Symptoms: Pt reports she has cloudy urine, pain with urination and urgency.  Reviewed Health History In EMR: Yes  Reviewed Medications In EMR: Yes  Reviewed Allergies In EMR: Yes  Reviewed Surgeries / Procedures: Yes  Date of Onset of Symptoms: 03/05/2014  Treatments Tried: Cranberry pills  Treatments Tried Worked: No  Guideline(s) Used:  Urination Pain - Female  Disposition Per Guideline:   See Today in Office  Reason For Disposition Reached:   > 2 UTIs in last year  Advice Given:  N/A  Patient Will Follow Care Advice:  YES  Appointment Scheduled:  03/19/2014 15:00:00 Appointment Scheduled Provider:  Webb Silversmith

## 2014-03-20 ENCOUNTER — Encounter: Payer: Self-pay | Admitting: Internal Medicine

## 2014-03-20 LAB — POCT URINALYSIS DIPSTICK
BILIRUBIN UA: NEGATIVE
GLUCOSE UA: NEGATIVE
KETONES UA: NEGATIVE
Nitrite, UA: NEGATIVE
Protein, UA: NEGATIVE
SPEC GRAV UA: 1.015
Urobilinogen, UA: NEGATIVE
pH, UA: 6

## 2014-04-23 ENCOUNTER — Ambulatory Visit (INDEPENDENT_AMBULATORY_CARE_PROVIDER_SITE_OTHER): Payer: Medicare Other | Admitting: Physician Assistant

## 2014-04-23 ENCOUNTER — Encounter: Payer: Self-pay | Admitting: Physician Assistant

## 2014-04-23 VITALS — BP 132/80 | HR 64 | Ht 63.0 in | Wt 159.0 lb

## 2014-04-23 DIAGNOSIS — J449 Chronic obstructive pulmonary disease, unspecified: Secondary | ICD-10-CM

## 2014-04-23 DIAGNOSIS — Z86010 Personal history of colonic polyps: Secondary | ICD-10-CM

## 2014-04-23 NOTE — Progress Notes (Signed)
Case discussed with physician assistant. The patient is high risk due to her advanced lung disease, but is likely stable and interested in proceeding with surveillance colonoscopy. As mentioned, we will perform the examination at the hospital with CRNA/anesthesia supervised sedation.

## 2014-04-23 NOTE — Patient Instructions (Signed)
You have been scheduled for a colonoscopy at [facility].  Please follow written instructions given to you at your visit today.  Please pick up your prep kit at the pharmacy within the next 1-3 days. If you use inhalers (even only as needed), please bring them with you on the day of your procedure.

## 2014-04-23 NOTE — Progress Notes (Signed)
[name], female   DOB: 1940/05/31, 73 y.o.   MRN: [id number]    PYK:DXIP is a delightful 73 year old female with cold stage IV COPD who was followed by Dr. Halford Chessman in [place]. She uses 4 L of oxygen with exertion and 3 L at rest. She has participated in pulmonary rehabilitation at [facility]. She lives in [place] and recently switched her care to Dr. Lake Bells.[name] [name] had a colonoscopy by Dr. Henrene Pastor in 2011 at which time 3 adenomatous polyps were  removed. She was advised to have surveillance in 3 years. Unfortunately in 2014 she was busy dealing with an ill husband and her pulmonary disease and she has not gotten around to screening her colonoscopy till now. She denies any change in her bowel habits or stool caliber. She has not had any bloody or tarry stools. Her appetite has been good and her weight has been stable.   Past Medical History  Diagnosis Date  . Diverticular disease 10/09/09    CT abd no sigmoid mass DDD L5/S1  . Peptic ulcer   . GERD (gastroesophageal reflux disease)   . Hiatal hernia   . Hypertension   . Dyslipidemia   . Chronic cystitis     recurrent UTI (4+ in last year), on suppressive doxy (uro Cope)  . Hematuria     h/o kidney stones  . COPD, severe 01/27/07       . Lung cancer 1987  . Spinal abscess 2000    secondary to periodontal disease  . Osteoarthritis   . Depression with anxiety   . Osteopenia 08/2011    DEXA: L femur -1.6  . Left scapholunate ligament tear 2014    found on imaging  . Colon polyp     Past Surgical History  Procedure Laterality Date  . Tubal ligation      bilateral  . Bladder surgery    . Breast biopsy  03/99    left  . Mouth surgery  [place] pneumonia sepsis due to gum surgery  . Cystoscopy  02/11/07 and 09/25/08    Dr. Jacqlyn Larsen, normal  . Cholecystectomy  02/10/09  . Total abdominal hysterectomy  07/18/01    benign reason, BSO, vag repair secondary prolapse  .  Replacement total knee  09/2009    Right  . Dexa  08/2011    T femur -1.6, T spine -0.6   Family History  Problem Relation Age of Onset  . Pancreatitis Mother   . Heart disease Father   . Hypertension Father   . Emphysema Father   . Arthritis Brother   . Hypertension Brother   . Cancer Maternal Aunt     ovarian cancer   History  Substance Use Topics  . Smoking status: Former Smoker -- 3.00 packs/day for 34 years    Types: Cigarettes    Quit date: 05/29/1995  . Smokeless tobacco: Never Used  . Alcohol Use: Yes     Comment: wine daily 2-3 glasses/day   Current Outpatient Prescriptions  Medication Sig Dispense Refill  . albuterol (PROAIR HFA) 108 (90 BASE) MCG/ACT inhaler Inhale 2 puffs into the lungs every 4 (four) hours as needed. 1 Inhaler 0  . albuterol (PROVENTIL) (2.5 MG/3ML) 0.083% nebulizer solution Take 3 mLs (2.5 mg total) by nebulization 4 (four) times daily. 75 mL 12  . budesonide (PULMICORT) 0.25 MG/2ML nebulizer solution Take 2 mLs (0.25 mg total) by nebulization 2 (two) times daily. 60 mL  12  . buPROPion (WELLBUTRIN SR) 150 MG 12 hr tablet TAKE ONE TABLET BY MOUTH TWICE DAILY 60 tablet 3  . Calcium Carb-Cholecalciferol (CALCIUM-VITAMIN D) 600-400 MG-UNIT TABS Take 1 tablet by mouth 2 (two) times daily.    Marland Kitchen CRANBERRY PO Take 2 tablets by mouth daily.    . diphenhydrAMINE (BENADRYL) 25 mg capsule Take 25 mg by mouth at bedtime as needed. Take 2 by mouth at bedtime as needed    . guaifenesin (HUMIBID E) 400 MG TABS Take by mouth.     . loratadine (CLARITIN) 10 MG tablet Take 10 mg by mouth daily.      Marland Kitchen losartan (COZAAR) 50 MG tablet TAKE ONE TABLET BY MOUTH EVERY DAY 30 tablet 3  . metoprolol tartrate (LOPRESSOR) 25 MG tablet TAKE ONE TABLET BY MOUTH TWICE DAILY 60 tablet 11  . Multiple Vitamin (MULTIVITAMIN) tablet Take 1 tablet by mouth daily.    . mupirocin nasal ointment (BACTROBAN NASAL) 2 % Place 1 application into the nose 2 (two) times daily. Use one-half of  tube in each nostril twice daily for five (5) days. After application, press sides of nose together and gently massage. 10 g 0  . NON FORMULARY Oxygen 4 liters daily and 3 liters at night    . omeprazole (PRILOSEC) 40 MG capsule Take 1 capsule (40 mg total) by mouth daily. 30 capsule 4  . oxybutynin (DITROPAN) 5 MG tablet TAKE ONE TABLET BY MOUTH TWICE DAILY 60 tablet 3  . pravastatin (PRAVACHOL) 80 MG tablet ONE TABLET BY MOUTH AT BEDTIME 30 tablet 11  . psyllium (METAMUCIL) 58.6 % powder Take 1 packet by mouth daily.    . sertraline (ZOLOFT) 100 MG tablet TAKE ONE AND ONE-HALF TABLETS AT BEDTIME (Patient taking differently: take one half tablet at HS) 45 tablet 3  . tiotropium (SPIRIVA) 18 MCG inhalation capsule Place 1 capsule (18 mcg total) into inhaler and inhale daily. 30 capsule 5   No current facility-administered medications for this visit.   Allergies  Allergen Reactions  . Keflex [Cephalexin] Shortness Of Breath  . Penicillins     REACTION: hives     Review of Systems: Gen: Denies any fever, chills, sweats, anorexia, fatigue, weakness, malaise, weight loss, and sleep disorder CV: Denies chest pain, angina, palpitations, syncope, orthopnea, PND, peripheral edema, and claudication. Resp: Denies dyspnea at rest, dyspnea with exercise, cough, sputum, wheezing, coughing up blood, and pleurisy. GI: Denies vomiting blood, jaundice, and fecal incontinence.   Denies dysphagia or odynophagia. GU : Denies urinary burning, blood in urine, urinary frequency, urinary hesitancy, nocturnal urination, and urinary incontinence. MS: Denies joint pain, limitation of movement, and swelling, stiffness, low back pain, extremity pain. Denies muscle weakness, cramps, atrophy.  Derm: Denies rash, itching, dry skin, hives, moles, warts, or unhealing ulcers.  Psych: Denies depression, anxiety, memory loss, suicidal ideation, hallucinations, paranoia, and confusion. Heme: Denies bruising, bleeding, and  enlarged lymph nodes. Neuro:  Denies any headaches, dizziness, paresthesias. Endo:  Denies any problems with DM, thyroid, adrenal function    Prior Endoscopies:   Colonoscopy 6/43/32:   COMPLICATIONS: None  ENDOSCOPIC IMPRESSION:  1) Three polyps - removed  2) Moderate diverticulosis in the left colon  3) Stenosis in the sigmoid colon, presumably due to diverticular  disease  RECOMMENDATIONS:  1) Contrast enhancedCT Scan of the abdomen and pelvis "LLQ pain  and sigmoid stenosis, evaluate"  2) Office appt w/ Dr Henrene Pastor in a few weeks, after CT completed  3) Follow up  colonoscopy in 3 years (PEDS SCOPE)  Physical Exam: BP 132/80 mmHg  Pulse 64  Ht 5\' 3"  (1.6 m)  Wt 159 lb (72.122 kg)  BMI 28.17 kg/m2 Constitutional: Pleasant,well-developed, female in no acute distress with nasal cannula in place HEENT: Normocephalic and atraumatic. Conjunctivae are normal. No scleral icterus. Neck supple.  Cardiovascular: Normal rate, regular rhythm.  Pulmonary/chest: Effort normal and breath sounds normal. No wheezing, rales or rhonchi. Abdominal: Soft, nondistended, nontender. Bowel sounds active throughout. There are no masses palpable. No hepatomegaly. Extremities: no edema Lymphadenopathy: No cervical adenopathy noted. Neurological: Alert and oriented to person place and time. Skin: Skin is warm and dry. No rashes noted. Psychiatric: Normal mood and affect. Behavior is normal.  ASSESSMENT AND PLAN: 73 year old female with a personal history of adenomatous colon polyps, due for surveillance colonoscopy to evaluate for recurrent polyps or neoplasia. The patient has old stage IV COPD and the she will be scheduled at the hospital.The risks, benefits, and alternatives to colonoscopy with possible biopsy and possible polypectomy were discussed with the patient and they consent to proceed. The procedure will be scheduled with Dr. Henrene Pastor.    Gillermo Poch, Vita Barley PA-C  04/23/2014, 11:07 AM

## 2014-04-24 ENCOUNTER — Other Ambulatory Visit: Payer: Self-pay | Admitting: Family Medicine

## 2014-04-24 ENCOUNTER — Ambulatory Visit: Payer: Medicare Other | Admitting: Physician Assistant

## 2014-04-24 ENCOUNTER — Encounter (HOSPITAL_COMMUNITY): Payer: Self-pay | Admitting: *Deleted

## 2014-04-25 ENCOUNTER — Telehealth: Payer: Self-pay | Admitting: Internal Medicine

## 2014-04-25 MED ORDER — MOVIPREP 100 G PO SOLR: 1.0000 | Freq: Once | ORAL | Status: DC

## 2014-04-25 NOTE — Telephone Encounter (Signed)
Sent Prep to pharmacy per patient's request

## 2014-04-30 ENCOUNTER — Ambulatory Visit (HOSPITAL_COMMUNITY)
Admission: RE | Admit: 2014-04-30 | Discharge: 2014-04-30 | Disposition: A | Discharge status: Home / Self Care | Payer: Medicare Other | Source: Ambulatory Visit | Attending: Internal Medicine | Admitting: Internal Medicine

## 2014-04-30 ENCOUNTER — Ambulatory Visit (HOSPITAL_COMMUNITY): Payer: Medicare Other | Admitting: *Deleted

## 2014-04-30 ENCOUNTER — Encounter (HOSPITAL_COMMUNITY): Payer: Self-pay

## 2014-04-30 ENCOUNTER — Encounter (HOSPITAL_COMMUNITY)
Admission: RE | Disposition: A | Discharge status: Home / Self Care | Payer: Self-pay | Source: Ambulatory Visit | Attending: Internal Medicine

## 2014-04-30 DIAGNOSIS — R5382 Chronic fatigue, unspecified: Secondary | ICD-10-CM | POA: Diagnosis not present

## 2014-04-30 DIAGNOSIS — Z86010 Personal history of colonic polyps: Secondary | ICD-10-CM | POA: Diagnosis not present

## 2014-04-30 DIAGNOSIS — J449 Chronic obstructive pulmonary disease, unspecified: Secondary | ICD-10-CM | POA: Diagnosis not present

## 2014-04-30 DIAGNOSIS — Z87891 Personal history of nicotine dependence: Secondary | ICD-10-CM | POA: Diagnosis not present

## 2014-04-30 DIAGNOSIS — K5730 Diverticulosis of large intestine without perforation or abscess without bleeding: Secondary | ICD-10-CM | POA: Insufficient documentation

## 2014-04-30 DIAGNOSIS — I10 Essential (primary) hypertension: Secondary | ICD-10-CM | POA: Insufficient documentation

## 2014-04-30 DIAGNOSIS — F418 Other specified anxiety disorders: Secondary | ICD-10-CM | POA: Diagnosis not present

## 2014-04-30 DIAGNOSIS — Z1211 Encounter for screening for malignant neoplasm of colon: Secondary | ICD-10-CM | POA: Insufficient documentation

## 2014-04-30 DIAGNOSIS — J984 Other disorders of lung: Secondary | ICD-10-CM | POA: Diagnosis not present

## 2014-04-30 DIAGNOSIS — M8580 Other specified disorders of bone density and structure, unspecified site: Secondary | ICD-10-CM | POA: Diagnosis not present

## 2014-04-30 DIAGNOSIS — K219 Gastro-esophageal reflux disease without esophagitis: Secondary | ICD-10-CM | POA: Insufficient documentation

## 2014-04-30 DIAGNOSIS — E785 Hyperlipidemia, unspecified: Secondary | ICD-10-CM | POA: Diagnosis not present

## 2014-04-30 DIAGNOSIS — M1990 Unspecified osteoarthritis, unspecified site: Secondary | ICD-10-CM | POA: Diagnosis not present

## 2014-04-30 HISTORY — PX: COLONOSCOPY WITH PROPOFOL: SHX5780

## 2014-04-30 SURGERY — COLONOSCOPY WITH PROPOFOL
Anesthesia: Monitor Anesthesia Care

## 2014-04-30 MED ORDER — LACTATED RINGERS IV SOLN
INTRAVENOUS | Status: DC
  Administered 2014-04-30: 1000 mL via INTRAVENOUS

## 2014-04-30 MED ORDER — PROPOFOL 10 MG/ML IV BOLUS
INTRAVENOUS | Status: DC | PRN
  Administered 2014-04-30: 20 mg via INTRAVENOUS
  Administered 2014-04-30: 40 mg via INTRAVENOUS
  Administered 2014-04-30: 20 mg via INTRAVENOUS
  Administered 2014-04-30: 30 mg via INTRAVENOUS
  Administered 2014-04-30: 40 mg via INTRAVENOUS
  Administered 2014-04-30 (×2): 20 mg via INTRAVENOUS

## 2014-04-30 MED ORDER — PROPOFOL INFUSION 10 MG/ML OPTIME
INTRAVENOUS | Status: DC | PRN
  Administered 2014-04-30: 50 ug/kg/min via INTRAVENOUS

## 2014-04-30 MED ORDER — SODIUM CHLORIDE 0.9 % IV SOLN: INTRAVENOUS | Status: DC

## 2014-04-30 MED ORDER — PROPOFOL 10 MG/ML IV BOLUS
INTRAVENOUS | Status: AC
  Filled 2014-04-30: qty 20

## 2014-04-30 SURGICAL SUPPLY — 21 items

## 2014-04-30 NOTE — Op Note (Signed)
[facility] [place], [address]   COLONOSCOPY PROCEDURE REPORT  PATIENT: [name], [name]  MR#: [id number] BIRTHDATE: 1941-04-10 , 73  yrs. old GENDER: female ENDOSCOPIST: Eustace Quail, MD REFERRED [id number] Program Recall PROCEDURE DATE:  04/30/2014 PROCEDURE:   Colonoscopy, surveillance First Screening Colonoscopy - Avg.  risk and is 50 yrs.  old or older - No.  Prior Negative Screening - Now for repeat screening. N/A  History of Adenoma - Now for follow-up colonoscopy & has been > or = to 3 yrs.  Yes hx of adenoma.  Has been 3 or more years since last colonoscopy.  Polyps Removed Today? No.  Recommend repeat exam, <10 yrs? Polyps Removed Today? No.  Recommend repeat exam, <10 yrs? No. ASA CLASS:   Class III INDICATIONS:surveillance colonoscopy based on a history of adenomatous colonic polyp(s). . Index exam 2004, most recent exam 2011 with 3 tubular adenomas MEDICATIONS: Monitored anesthesia care and Per Anesthesia  DESCRIPTION OF PROCEDURE:   After the risks benefits and alternatives of the procedure were thoroughly explained, informed consent was obtained.  The digital rectal exam revealed no abnormalities of the rectum.   The Pentax Ped Colon [id number] endoscope was introduced through the anus and advanced to the cecum, which was identified by both the appendix and ileocecal valve. No adverse events experienced.   The quality of the prep was good, using MoviPrep  The instrument was then slowly withdrawn as the colon was fully examined.  COLON FINDINGS: The colonoscope (pediatric) was advanced to the cecal tip.  This was quite difficult due to severe left-sided diverticulosis with marked sigmoid stenosis.  Careful inspection of the colonic mucosa from the cecal tip to rectum revealed no polyps or other significant mucosal abnormalities.  Retroflexed views revealed no abnormalities. The time to cecum=16 minutes 0 seconds. Withdrawal  time=8 minutes 0 seconds.  The scope was withdrawn and the procedure completed. COMPLICATIONS: There were no immediate complications.  ENDOSCOPIC IMPRESSION: 1. Severe diverticulosis with sigmoid stenosis 2. Otherwise normal colonoscopy. No polyps  RECOMMENDATIONS: 1. Return to the care of your primary provider.  GI follow up as needed  eSigned:  Eustace Quail, MD 2014-04-30 12:19:27.373   cc: The Patient

## 2014-04-30 NOTE — Interval H&P Note (Signed)
History and Physical Interval Note:   The patient was recently seen in the office for surveillance colonoscopy. She has advanced lung disease which she is clinically stable. Proceeding with surveillance colonoscopy with anesthesia monitored sedation.  04/30/2014 11:29 AM  [name] [name]  has presented today for surgery, with the diagnosis of hx of colon polyps  The various methods of treatment have been discussed with the patient and family. After consideration of risks, benefits and other options for treatment, the patient has consented to  Procedure(s): COLONOSCOPY WITH PROPOFOL (N/A) as a surgical intervention .  The patient's history has been reviewed, patient examined, no change in status, stable for surgery.  I have reviewed the patient's chart and labs.  Questions were answered to the patient's satisfaction.     Scarlette Shorts

## 2014-04-30 NOTE — Transfer of Care (Signed)
Immediate Anesthesia Transfer of Care Note  Patient: [name] [name]  Procedure(s) Performed: Procedure(s): COLONOSCOPY WITH PROPOFOL (N/A)  Patient Location: PACU  Anesthesia Type:MAC  Level of Consciousness: Patient easily awoken, sedated, comfortable, cooperative, following commands, responds to stimulation.   Airway & Oxygen Therapy: Patient spontaneously breathing, ventilating well, oxygen via simple oxygen mask.  Post-op Assessment: Report given to PACU RN, vital signs reviewed and stable, moving all extremities.   Post vital signs: Reviewed and stable.  Complications: No apparent anesthesia complications

## 2014-04-30 NOTE — Anesthesia Postprocedure Evaluation (Signed)
Anesthesia Post Note  Patient: [name] [name]  Procedure(s) Performed: Procedure(s) (LRB): COLONOSCOPY WITH PROPOFOL (N/A)  Anesthesia type: MAC  Patient location: PACU  Post pain: Pain level controlled  Post assessment: Post-op Vital signs reviewed  Last Vitals: BP 156/87 mmHg  Pulse 62  Temp(Src) 37.1 C (Oral)  Resp 20  SpO2 100%  Post vital signs: Reviewed  Level of consciousness: awake  Complications: No apparent anesthesia complications

## 2014-04-30 NOTE — Discharge Instructions (Signed)

## 2014-04-30 NOTE — Anesthesia Preprocedure Evaluation (Signed)
Anesthesia Evaluation  Patient identified by MRN, date of birth, ID band Patient awake    Reviewed: Allergy & Precautions, H&P , NPO status , Patient's Chart, lab work & pertinent test results, reviewed documented beta blocker date and time   Airway Mallampati: II  TM Distance: >3 FB Neck ROM: Full    Dental no notable dental hx.    Pulmonary COPD COPD inhaler, former smoker,  breath sounds clear to auscultation  Pulmonary exam normal       Cardiovascular hypertension, Pt. on medications and Pt. on home beta blockers Rhythm:Regular Rate:Normal     Neuro/Psych PSYCHIATRIC DISORDERS Anxiety Depression negative neurological ROS     GI/Hepatic Neg liver ROS, hiatal hernia, PUD, GERD-  ,  Endo/Other  negative endocrine ROS  Renal/GU negative Renal ROS     Musculoskeletal  (+) Arthritis -,   Abdominal   Peds  Hematology negative hematology ROS (+)   Anesthesia Other Findings   Reproductive/Obstetrics negative OB ROS                             Anesthesia Physical Anesthesia Plan  ASA: III  Anesthesia Plan: MAC   Post-op Pain Management:    Induction: Intravenous  Airway Management Planned:   Additional Equipment:   Intra-op Plan:   Post-operative Plan:   Informed Consent: I have reviewed the patients History and Physical, chart, labs and discussed the procedure including the risks, benefits and alternatives for the proposed anesthesia with the patient or authorized representative who has indicated his/her understanding and acceptance.   Dental advisory given  Plan Discussed with: CRNA  Anesthesia Plan Comments:         Anesthesia Quick Evaluation

## 2014-04-30 NOTE — H&P (View-Only) (Signed)
Case discussed with physician assistant. The patient is high risk due to her advanced lung disease, but is likely stable and interested in proceeding with surveillance colonoscopy. As mentioned, we will perform the examination at the hospital with CRNA/anesthesia supervised sedation.

## 2014-05-01 ENCOUNTER — Encounter (HOSPITAL_COMMUNITY): Payer: Self-pay | Admitting: Internal Medicine

## 2014-05-07 ENCOUNTER — Encounter: Payer: Medicare Other | Admitting: Internal Medicine

## 2014-05-16 ENCOUNTER — Encounter: Payer: Self-pay | Admitting: Internal Medicine

## 2014-05-16 ENCOUNTER — Ambulatory Visit (INDEPENDENT_AMBULATORY_CARE_PROVIDER_SITE_OTHER): Payer: Medicare Other | Admitting: Internal Medicine

## 2014-05-16 VITALS — BP 132/82 | HR 61 | Temp 97.7°F | Wt 158.0 lb

## 2014-05-16 DIAGNOSIS — R300 Dysuria: Secondary | ICD-10-CM

## 2014-05-16 DIAGNOSIS — N390 Urinary tract infection, site not specified: Secondary | ICD-10-CM

## 2014-05-16 LAB — POCT URINALYSIS DIPSTICK
Bilirubin, UA: NEGATIVE
GLUCOSE UA: NEGATIVE
Ketones, UA: NEGATIVE
NITRITE UA: POSITIVE
PH UA: 6
PROTEIN UA: NEGATIVE
SPEC GRAV UA: 1.015
UROBILINOGEN UA: NEGATIVE

## 2014-05-16 MED ORDER — SULFAMETHOXAZOLE-TRIMETHOPRIM 800-160 MG PO TABS: 1.0000 | ORAL_TABLET | Freq: Two times a day (BID) | ORAL | Status: DC

## 2014-05-16 NOTE — Patient Instructions (Signed)

## 2014-05-16 NOTE — Progress Notes (Signed)
Pre visit review using our clinic review tool, if applicable. No additional management support is needed unless otherwise documented below in the visit note. 

## 2014-05-16 NOTE — Progress Notes (Signed)
HPI  Pt presents to the clinic today with c/o urgency, frequency and dysuria. She reports this started 2 days ago. She has noticed an odor as well. She denies fever, chills or back pain. She does have a issue with recurrent UTI's. She has not taken anything OTC. She has tried increasing her fluids.   Review of Systems  Past Medical History  Diagnosis Date  . Diverticular disease 10/09/09    CT abd no sigmoid mass DDD L5/S1  . Peptic ulcer   . GERD (gastroesophageal reflux disease)   . Hiatal hernia   . Hypertension   . Dyslipidemia   . Chronic cystitis     recurrent UTI (4+ in last year), on suppressive doxy (uro Cope)  . Hematuria     h/o kidney stones  . COPD, severe 01/27/07       . Lung cancer 1987  . Spinal abscess 2000    secondary to periodontal disease  . Osteoarthritis   . Depression with anxiety   . Osteopenia 08/2011    DEXA: L femur -1.6  . Left scapholunate ligament tear 2014    found on imaging  . Colon polyp     Family History  Problem Relation Age of Onset  . Pancreatitis Mother   . Heart disease Father   . Hypertension Father   . Emphysema Father   . Arthritis Brother   . Hypertension Brother   . Cancer Maternal Aunt     ovarian cancer    History   Social History  . Marital Status: Married    Spouse Name: N/A    Number of Children: 2  . Years of Education: N/A   Occupational History  . Retired     prior Glass blower/designer at [employer] History Main Topics  . Smoking status: Former Smoker -- 3.00 packs/day for 34 years    Types: Cigarettes    Quit date: 05/29/1995  . Smokeless tobacco: Never Used  . Alcohol Use: Yes     Comment: wine daily 2-3 glasses/day  . Drug Use: No  . Sexual Activity: Not on file   Other Topics Concern  . Not on file   Social History Narrative   Caffeine: 4 cups coffee   Remarried for the third time, 2nd husband died of heart attack. Two stepsons   Activity: Walks for exercise   Diet: good  water, fruits/vegetables daily      Advanced directed - would want HCPOA to be husband, Karlton Lemon. Does not have set up - requests handout.    Allergies  Allergen Reactions  . Keflex [Cephalexin] Shortness Of Breath  . Penicillins     REACTION: hives    Constitutional: Denies fever, malaise, fatigue, headache or abrupt weight changes.   GU: Pt reports urgency, frequency, pain with urination and odor. Denies burning sensation, blood in urine or discharge. Skin: Denies redness, rashes, lesions or ulcercations.   No other specific complaints in a complete review of systems (except as listed in HPI above).    Objective:   Physical Exam  BP 132/82 mmHg  Pulse 61  Temp(Src) 97.7 F (36.5 C) (Oral)  Wt 158 lb (71.668 kg)  SpO2 97% Wt Readings from Last 3 Encounters:  05/16/14 158 lb (71.668 kg)  04/23/14 159 lb (72.122 kg)  03/19/14 160 lb (72.576 kg)    General: Appears her stated age, chronically ill appearing in NAD. Cardiovascular: Normal rate and rhythm. S1,S2 noted.  No murmur, rubs or gallops  noted.  Pulmonary/Chest: Increased effort and positive vesicular breath sounds (on O2). No respiratory distress. No wheezes, rales or ronchi noted.  Abdomen: Soft. Normal bowel sounds, no bruits noted. No distention or masses noted. Liver, spleen and kidneys non palpable. Tender to palpation over the bladder area. No CVA tenderness.      Assessment & Plan:   Urgency, Frequency, Dysuria secondary to UTI  Urinalysis: 3+ leuks, pos nitrites, pos blood Will send urine culture eRx sent if for Septra BID x 5 days OK to take AZO OTC Drink plenty of fluids  RTC as needed or if symptoms persist.

## 2014-05-16 NOTE — Addendum Note (Signed)
Addended by: Lurlean Nanny on: 05/16/2014 11:43 AM   Modules accepted: Orders

## 2014-05-19 LAB — URINE CULTURE

## 2014-06-15 DIAGNOSIS — R0902 Hypoxemia: Secondary | ICD-10-CM | POA: Diagnosis not present

## 2014-06-15 DIAGNOSIS — J309 Allergic rhinitis, unspecified: Secondary | ICD-10-CM | POA: Diagnosis not present

## 2014-07-01 ENCOUNTER — Ambulatory Visit (INDEPENDENT_AMBULATORY_CARE_PROVIDER_SITE_OTHER): Payer: Medicare Other | Admitting: Internal Medicine

## 2014-07-01 ENCOUNTER — Other Ambulatory Visit: Payer: Self-pay | Admitting: Family Medicine

## 2014-07-01 ENCOUNTER — Encounter: Payer: Self-pay | Admitting: Internal Medicine

## 2014-07-01 VITALS — BP 124/72 | HR 72 | Temp 97.7°F | Wt 159.0 lb

## 2014-07-01 DIAGNOSIS — R05 Cough: Secondary | ICD-10-CM | POA: Diagnosis not present

## 2014-07-01 DIAGNOSIS — R059 Cough, unspecified: Secondary | ICD-10-CM

## 2014-07-01 DIAGNOSIS — N390 Urinary tract infection, site not specified: Secondary | ICD-10-CM

## 2014-07-01 DIAGNOSIS — R300 Dysuria: Secondary | ICD-10-CM | POA: Diagnosis not present

## 2014-07-01 LAB — POCT URINALYSIS DIPSTICK
Bilirubin, UA: NEGATIVE
Glucose, UA: NEGATIVE
Ketones, UA: NEGATIVE
Nitrite, UA: POSITIVE
PH UA: 6
Spec Grav, UA: 1.015
Urobilinogen, UA: NEGATIVE

## 2014-07-01 MED ORDER — CIPROFLOXACIN HCL 500 MG PO TABS: 500.0000 mg | ORAL_TABLET | Freq: Two times a day (BID) | ORAL | Status: DC

## 2014-07-01 NOTE — Progress Notes (Signed)
Pre visit review using our clinic review tool, if applicable. No additional management support is needed unless otherwise documented below in the visit note. 

## 2014-07-01 NOTE — Addendum Note (Signed)
Addended by: Lurlean Nanny on: 07/01/2014 03:50 PM   Modules accepted: Orders

## 2014-07-01 NOTE — Progress Notes (Signed)
Subjective:    [name], female    DOB: 01-06-1941, 73 y.o.   MRN: [id number]  HPI  Pt presents to the clinic today with multiple complaints.  1- She c/o cough and chest congestion. She reports this started 2 weeks ago. The cough is non productive. She denies fever, chills or body aches. She has has some associated shortness of breath. She does wear oxygen. She denies chest pain. She does have COPD and has been taking her Pulmicort and Spririva. She reports she has not been taking her guaifenesin as directed. She has not tried anything else OTC. She has not had sick contacts. She is UTD on pneumonia and flu vaccines.  2-She c/o urgency, frequency and dysuria. She reports this started yesterday. She denies fever, chills or low back pain. She does take cranberry tablets without relief. She has had UTI's in the past and reports this feels the same. Her last UTI was 12/15 treated with Septra.  Review of Systems  Past Medical History  Diagnosis Date  . Diverticular disease 10/09/09    CT abd no sigmoid mass DDD L5/S1  . Peptic ulcer   . GERD (gastroesophageal reflux disease)   . Hiatal hernia   . Hypertension   . Dyslipidemia   . Chronic cystitis     recurrent UTI (4+ in last year), on suppressive doxy (uro Cope)  . Hematuria     h/o kidney stones  . COPD, severe 01/27/07       . Lung cancer 1987  . Spinal abscess 2000    secondary to periodontal disease  . Osteoarthritis   . Depression with anxiety   . Osteopenia 08/2011    DEXA: L femur -1.6  . Left scapholunate ligament tear 2014    found on imaging  . Colon polyp     Current Outpatient Prescriptions  Medication Sig Dispense Refill  . albuterol (PROAIR HFA) 108 (90 BASE) MCG/ACT inhaler Inhale 2 puffs into the lungs every 4 (four) hours as needed. (Patient taking differently: Inhale 2 puffs into the lungs every 4 (four) hours as needed for wheezing or shortness of breath. ) 1 Inhaler 0  . albuterol  (PROVENTIL) (2.5 MG/3ML) 0.083% nebulizer solution Take 3 mLs (2.5 mg total) by nebulization 4 (four) times daily. 75 mL 12  . budesonide (PULMICORT) 0.25 MG/2ML nebulizer solution Take 2 mLs (0.25 mg total) by nebulization 2 (two) times daily. 60 mL 12  . buPROPion (WELLBUTRIN SR) 150 MG 12 hr tablet TAKE ONE TABLET BY MOUTH TWICE DAILY 60 tablet 3  . Calcium Carb-Cholecalciferol (CALCIUM-VITAMIN D) 600-400 MG-UNIT TABS Take 1 tablet by mouth 2 (two) times daily.    Marland Kitchen CRANBERRY PO Take 2 tablets by mouth every morning.     . diphenhydrAMINE (BENADRYL) 25 mg capsule Take 25 mg by mouth at bedtime as needed for itching or sleep.     Marland Kitchen loratadine (CLARITIN) 10 MG tablet Take 10 mg by mouth every morning.     Marland Kitchen losartan (COZAAR) 50 MG tablet TAKE ONE TABLET BY MOUTH EVERY DAY 30 tablet 3  . metoprolol tartrate (LOPRESSOR) 25 MG tablet TAKE ONE TABLET BY MOUTH TWICE DAILY 60 tablet 11  . Multiple Vitamin (MULTIVITAMIN) tablet Take 1 tablet by mouth every morning.     . mupirocin nasal ointment (BACTROBAN NASAL) 2 % Place 1 application into the nose 2 (two) times daily. Use one-half of tube in each nostril twice daily for five (5) days. After application,  press sides of nose together and gently massage. (Patient taking differently: Place 1 application into the nose daily as needed (nostril). ) 10 g 0  . NON FORMULARY Oxygen 4 liters daily and 3 liters at night    . omeprazole (PRILOSEC) 40 MG capsule TAKE ONE CAPSULE BY MOUTH EVERY DAY 30 capsule 3  . oxybutynin (DITROPAN) 5 MG tablet TAKE ONE TABLET BY MOUTH TWICE DAILY 60 tablet 3  . pravastatin (PRAVACHOL) 80 MG tablet ONE TABLET BY MOUTH AT BEDTIME 30 tablet 11  . psyllium (METAMUCIL) 58.6 % powder Take 1 packet by mouth at bedtime.     . sertraline (ZOLOFT) 100 MG tablet TAKE ONE AND ONE-HALF TABLETS AT BEDTIME 45 tablet 6  . tiotropium (SPIRIVA) 18 MCG inhalation capsule Place 1 capsule (18 mcg total) into inhaler and inhale daily. (Patient taking  differently: Place 18 mcg into inhaler and inhale daily as needed. ) 30 capsule 5  . guaifenesin (HUMIBID E) 400 MG TABS Take 400 mg by mouth every morning.      No current facility-administered medications for this visit.    Allergies  Allergen Reactions  . Keflex [Cephalexin] Shortness Of Breath  . Penicillins     REACTION: hives    Family History  Problem Relation Age of Onset  . Pancreatitis Mother   . Heart disease Father   . Hypertension Father   . Emphysema Father   . Arthritis Brother   . Hypertension Brother   . Cancer Maternal Aunt     ovarian cancer    History   Social History  . Marital Status: Married    Spouse Name: N/A    Number of Children: 2  . Years of Education: N/A   Occupational History  . Retired     prior Glass blower/designer at [employer] History Main Topics  . Smoking status: Former Smoker -- 3.00 packs/day for 34 years    Types: Cigarettes    Quit date: 05/29/1995  . Smokeless tobacco: Never Used  . Alcohol Use: Yes     Comment: wine daily 2-3 glasses/day  . Drug Use: No  . Sexual Activity: Not on file   Other Topics Concern  . Not on file   Social History Narrative   Caffeine: 4 cups coffee   Remarried for the third time, 2nd husband died of heart attack. Two stepsons   Activity: Walks for exercise   Diet: good water, fruits/vegetables daily      Advanced directed - would want HCPOA to be husband, Karlton Lemon. Does not have set up - requests handout.     Constitutional: Denies fever, malaise, fatigue, headache or abrupt weight changes.  HEENT: Denies eye pain, eye redness, ear pain, ringing in the ears, wax buildup, runny nose, nasal congestion, bloody nose, or sore throat. Respiratory: Pt reports cough and shortness of breath. Denies difficulty breathing, or sputum production.   Cardiovascular: Denies chest pain, chest tightness, palpitations or swelling in the hands or feet.  Gastrointestinal: Denies abdominal pain,  bloating, constipation, diarrhea or blood in the stool.  GU: Pt reports urgency, frequency and dysuria. Denies burning sensation, blood in urine, odor or discharge. Skin: Denies redness, rashes, lesions or ulcercations.   No other specific complaints in a complete review of systems (except as listed in HPI above).     Objective:   Physical Exam   BP 124/72 mmHg  Pulse 72  Temp(Src) 97.7 F (36.5 C) (Oral)  Wt 159 lb (72.122 kg)  SpO2 97% Wt Readings from Last 3 Encounters:  07/01/14 159 lb (72.122 kg)  05/16/14 158 lb (71.668 kg)  04/23/14 159 lb (72.122 kg)    General: Appears her stated age, chronically ill appearing in NAD. Skin: Warm, dry and intact. No rashes, lesions or ulcerations noted. Neck: No lymphadenopathy noted.  Cardiovascular: Normal rate and rhythm. S1,S2 noted.  No murmur, rubs or gallops noted.  Pulmonary/Chest: Increased effort and positive vesicular breath sounds. No respiratory distress. No wheezes, rales or ronchi noted.  Abdomen: Soft. Tender to palpation over the bladder. Normal bowel sounds, no bruits noted. No distention or masses noted. Liver, spleen and kidneys non palpable.No CVA tenderness noted. Neurological: Alert and oriented.  BMET    Component Value Date/Time   NA 139 02/13/2014 0907   K 4.3 02/13/2014 0907   CL 102 02/13/2014 0907   CO2 30 02/13/2014 0907   GLUCOSE 117* 02/13/2014 0907   BUN 16 02/13/2014 0907   CREATININE 1.0 02/13/2014 0907   CALCIUM 9.4 02/13/2014 0907   GFRNONAA 84.13 10/07/2009 1240   GFRAA 80 06/17/2008 0945    Lipid Panel     Component Value Date/Time   CHOL 219* 02/13/2014 0907   TRIG 200.0* 02/13/2014 0907   HDL 71.30 02/13/2014 0907   CHOLHDL 3 02/13/2014 0907   VLDL 40.0 02/13/2014 0907   LDLCALC 108* 02/13/2014 0907    CBC    Component Value Date/Time   WBC 7.2 02/13/2014 0907   RBC 4.47 02/13/2014 0907   HGB 13.4 02/13/2014 0907   HCT 41.1 02/13/2014 0907   PLT 162.0 02/13/2014 0907    MCV 91.9 02/13/2014 0907   MCHC 32.7 02/13/2014 0907   RDW 14.1 02/13/2014 0907   LYMPHSABS 2.2 02/13/2014 0907   MONOABS 0.4 02/13/2014 0907   EOSABS 0.2 02/13/2014 0907   BASOSABS 0.0 02/13/2014 0907    Hgb A1C Lab Results  Component Value Date   HGBA1C 5.5 02/13/2014       Assessment & Plan:   Urgency, frequency and dysuria secondary to UTI:  Urinalysis: 3+ leuks, pos nitrites, 3+ blood Will send urine culture eRx for Cipro BID x 7 days Push fluids Ok to take AZO OTC if needed  Cough:  No evidence of COPD exacerbation, bronchitis or pneumonia Advised her to resume taking the guaifenesin as prescribed Continue oxygen and inhalers as prescribed  RTC as needed or if symptoms persist or worsen

## 2014-07-01 NOTE — Patient Instructions (Signed)

## 2014-07-04 LAB — URINE CULTURE: Colony Count: >=100000

## 2014-07-08 ENCOUNTER — Other Ambulatory Visit: Payer: Self-pay | Admitting: Internal Medicine

## 2014-07-08 DIAGNOSIS — J439 Emphysema, unspecified: Secondary | ICD-10-CM

## 2014-07-08 MED ORDER — TIOTROPIUM BROMIDE MONOHYDRATE 18 MCG IN CAPS: 18.0000 ug | ORAL_CAPSULE | Freq: Every day | RESPIRATORY_TRACT | Status: DC

## 2014-07-16 DIAGNOSIS — J309 Allergic rhinitis, unspecified: Secondary | ICD-10-CM | POA: Diagnosis not present

## 2014-07-16 DIAGNOSIS — R0902 Hypoxemia: Secondary | ICD-10-CM | POA: Diagnosis not present

## 2014-08-05 ENCOUNTER — Encounter: Payer: Self-pay | Admitting: Family Medicine

## 2014-08-05 ENCOUNTER — Ambulatory Visit (INDEPENDENT_AMBULATORY_CARE_PROVIDER_SITE_OTHER): Payer: Medicare Other | Admitting: Family Medicine

## 2014-08-05 VITALS — BP 122/70 | HR 72 | Temp 98.1°F | Wt 158.5 lb

## 2014-08-05 DIAGNOSIS — R3915 Urgency of urination: Secondary | ICD-10-CM

## 2014-08-05 LAB — POCT URINALYSIS DIPSTICK
BILIRUBIN UA: NEGATIVE
GLUCOSE UA: NEGATIVE
Ketones, UA: NEGATIVE
LEUKOCYTES UA: NEGATIVE
NITRITE UA: NEGATIVE
PH UA: 6
Protein, UA: NEGATIVE
RBC UA: NEGATIVE
Spec Grav, UA: 1.015
Urobilinogen, UA: 0.2

## 2014-08-05 NOTE — Patient Instructions (Signed)
Urine was looking ok. I think this is more from worsening urine urge incontinence Let's restart myrbetriq. Update [place] with effect after 1-2 weeks of using medicine  Urinary Incontinence Urinary incontinence is the involuntary loss of urine from your bladder. CAUSES  There are many causes of urinary incontinence. They include:  Medicines.  Infections.  Prostatic enlargement, leading to overflow of urine from your bladder.  Surgery.  Neurological diseases.  Emotional factors. SIGNS AND SYMPTOMS Urinary Incontinence can be divided into four types: 1. Urge incontinence. Urge incontinence is the involuntary loss of urine before you have the opportunity to go to the bathroom. There is a sudden urge to void but not enough time to reach a bathroom. 2. Stress incontinence. Stress incontinence is the sudden loss of urine with any activity that forces urine to pass. It is commonly caused by anatomical changes to the pelvis and sphincter areas of your body. 3. Overflow incontinence. Overflow incontinence is the loss of urine from an obstructed opening to your bladder. This results in a backup of urine and a resultant buildup of pressure within the bladder. When the pressure within the bladder exceeds the closing pressure of the sphincter, the urine overflows, which causes incontinence, similar to water overflowing a dam. 4. Total incontinence. Total incontinence is the loss of urine as a result of the inability to store urine within your bladder. DIAGNOSIS  Evaluating the cause of incontinence may require:  A thorough and complete medical and obstetric history.  A complete physical exam.  Laboratory tests such as a urine culture and sensitivities. When additional tests are indicated, they can include:  An ultrasound exam.  Kidney and bladder X-rays.  Cystoscopy. This is an exam of the bladder using a narrow scope.  Urodynamic testing to test the nerve function to the bladder and  sphincter areas. TREATMENT  Treatment for urinary incontinence depends on the cause:  For urge incontinence caused by a bacterial infection, antibiotics will be prescribed. If the urge incontinence is related to medicines you take, your health care provider may have you change the medicine.  For stress incontinence, surgery to re-establish anatomical support to the bladder or sphincter, or both, will often correct the condition.  For overflow incontinence caused by an enlarged prostate, an operation to open the channel through the enlarged prostate will allow the flow of urine out of the bladder. In women with fibroids, a hysterectomy may be recommended.  For total incontinence, surgery on your urinary sphincter may help. An artificial urinary sphincter (an inflatable cuff placed around the urethra) may be required. In women who have developed a hole-like passage between their bladder and vagina (vesicovaginal fistula), surgery to close the fistula often is required. HOME CARE INSTRUCTIONS  Normal daily hygiene and the use of pads or adult diapers that are changed regularly will help prevent odors and skin damage.  Avoid caffeine. It can overstimulate your bladder.  Use the bathroom regularly. Try about every 2-3 hours to go to the bathroom, even if you do not feel the need to do so. Take time to empty your bladder completely. After urinating, wait a minute. Then try to urinate again.  For causes involving nerve dysfunction, keep a log of the medicines you take and a journal of the times you go to the bathroom. SEEK MEDICAL CARE IF:  You experience worsening of pain instead of improvement in pain after your procedure.  Your incontinence becomes worse instead of better. SEE IMMEDIATE MEDICAL CARE IF:  You  experience fever or shaking chills.  You are unable to pass your urine.  You have redness spreading into your groin or down into your thighs. MAKE SURE YOU:   Understand these  instructions.   Will watch your condition.  Will get help right away if you are not doing well or get worse. Document Released: 06/17/2004 Document Revised: 02/28/2013 Document Reviewed: 10/17/2012 [facility] Patient Information 2015 [place], [place]. This information is not intended to replace advice given to you by your health care provider. Make sure you discuss any questions you have with your health care provider.

## 2014-08-05 NOTE — Progress Notes (Signed)
Pre visit review using our clinic review tool, if applicable. No additional management support is needed unless otherwise documented below in the visit note. 

## 2014-08-05 NOTE — Addendum Note (Signed)
Addended by: Royann Shivers A on: 08/05/2014 02:28 PM   Modules accepted: Orders

## 2014-08-05 NOTE — Progress Notes (Signed)
BP 122/70 mmHg  Pulse 72  Temp(Src) 98.1 F (36.7 C) (Oral)  Wt 158 lb 8 oz (71.895 kg)   CC: worsening urinary urgency  Subjective:    [name], female    DOB: 1941-02-08, 74 y.o.   MRN: [id number]  HPI: [name] [name] is a 74 y.o. female presenting on 08/05/2014 for Urinary Tract Infection   1 wk h/o urinary urgency and frequency. No dysuria, abdominal pain, hematuria. Bowels also a bit loose.   Currently off Myrtetriq.   Last saw Dr Jacqlyn Larsen.   Known h/o recurrent UTIs, failed cranberry PO, doxy 100mg  preventatively, septra 1/2 tab preventatively. Keflex and PCN allergy.   -Seen 09/2013 with resistant Citrobacter freundii treated with macrobid treated with septra course and 1/2 tab septra started. -Normal UA 92/015 off ppx abx.  -Seen 02/2014 (no UCx sent) treated with cipro 500mg  bid 7d course. -Seen 04/2014 with >100k Klebsiella resistant amp and intermediate nitro, sens other meds, treated with septra bid x 5 days. -Seen 06/2014 with >100k pansensitive Ecoli treated with cipro 500mg  bid x 7 days.  Lab Results  Component Value Date   CREATININE 1.0 02/13/2014     Relevant past medical, surgical, family and social history reviewed and updated as indicated. Interim medical history since our last visit reviewed. Allergies and medications reviewed and updated. Current Outpatient Prescriptions on File Prior to Visit  Medication Sig  . albuterol (PROAIR HFA) 108 (90 BASE) MCG/ACT inhaler Inhale 2 puffs into the lungs every 4 (four) hours as needed. (Patient taking differently: Inhale 2 puffs into the lungs every 4 (four) hours as needed for wheezing or shortness of breath. )  . albuterol (PROVENTIL) (2.5 MG/3ML) 0.083% nebulizer solution Take 3 mLs (2.5 mg total) by nebulization 4 (four) times daily.  . budesonide (PULMICORT) 0.25 MG/2ML nebulizer solution Take 2 mLs (0.25 mg total) by nebulization 2 (two) times daily.  Marland Kitchen buPROPion (WELLBUTRIN SR) 150 MG 12  hr tablet TAKE ONE TABLET BY MOUTH TWICE DAILY  . Calcium Carb-Cholecalciferol (CALCIUM-VITAMIN D) 600-400 MG-UNIT TABS Take 1 tablet by mouth 2 (two) times daily.  Marland Kitchen CRANBERRY PO Take 2 tablets by mouth every morning.   . diphenhydrAMINE (BENADRYL) 25 mg capsule Take 25 mg by mouth at bedtime as needed for itching or sleep.   Marland Kitchen guaifenesin (HUMIBID E) 400 MG TABS Take 400 mg by mouth every morning.   . loratadine (CLARITIN) 10 MG tablet Take 10 mg by mouth every morning.   Marland Kitchen losartan (COZAAR) 50 MG tablet TAKE ONE TABLET BY MOUTH EVERY DAY  . metoprolol tartrate (LOPRESSOR) 25 MG tablet TAKE ONE TABLET BY MOUTH TWICE DAILY  . Multiple Vitamin (MULTIVITAMIN) tablet Take 1 tablet by mouth every morning.   . NON FORMULARY Oxygen 4 liters daily and 3 liters at night  . omeprazole (PRILOSEC) 40 MG capsule TAKE ONE CAPSULE BY MOUTH EVERY DAY  . oxybutynin (DITROPAN) 5 MG tablet TAKE ONE TABLET BY MOUTH TWICE DAILY  . pravastatin (PRAVACHOL) 80 MG tablet ONE TABLET BY MOUTH AT BEDTIME  . psyllium (METAMUCIL) 58.6 % powder Take 1 packet by mouth at bedtime.   . sertraline (ZOLOFT) 100 MG tablet TAKE ONE AND ONE-HALF TABLETS AT BEDTIME  . tiotropium (SPIRIVA) 18 MCG inhalation capsule Place 1 capsule (18 mcg total) into inhaler and inhale daily. Pt needs office visit before future refills.  . mupirocin nasal ointment (BACTROBAN NASAL) 2 % Place 1 application into the nose 2 (two) times daily.  Use one-half of tube in each nostril twice daily for five (5) days. After application, press sides of nose together and gently massage. (Patient not taking: Reported on 08/05/2014)   No current facility-administered medications on file prior to visit.    Review of Systems Per HPI unless specifically indicated above     Objective:    BP 122/70 mmHg  Pulse 72  Temp(Src) 98.1 F (36.7 C) (Oral)  Wt 158 lb 8 oz (71.895 kg)  Wt Readings from Last 3 Encounters:  08/05/14 158 lb 8 oz (71.895 kg)  07/01/14 159  lb (72.122 kg)  05/16/14 158 lb (71.668 kg)    Physical Exam  Constitutional: She appears well-developed and well-nourished. No distress.  Abdominal: Soft. Normal appearance and bowel sounds are normal. She exhibits no distension and no mass. There is no hepatosplenomegaly. There is no tenderness. There is no rigidity, no rebound, no guarding, no CVA tenderness and negative Murphy's sign.  Nursing note and vitals reviewed.     Assessment & Plan:   Problem List Items Addressed This Visit    Urinary urgency - Primary    UA clear today. Anticipate more urge incontinence. rec restart myrbetriq (has bottle in bag but she has not been taking this). She continues taking oxybutynin 5mg  bid. Monitor BP on med. Pt agrees with plan.          Follow up plan: Return if symptoms worsen or fail to improve.

## 2014-08-05 NOTE — Assessment & Plan Note (Addendum)
UA clear today. Anticipate more urge incontinence. rec restart myrbetriq (has bottle in bag but she has not been taking this). She continues taking oxybutynin 5mg  bid. Monitor BP on med. Pt agrees with plan.

## 2014-08-07 ENCOUNTER — Other Ambulatory Visit: Payer: Self-pay | Admitting: Internal Medicine

## 2014-08-07 ENCOUNTER — Other Ambulatory Visit: Payer: Self-pay | Admitting: Family Medicine

## 2014-08-13 ENCOUNTER — Other Ambulatory Visit: Payer: Self-pay

## 2014-08-13 MED ORDER — ALBUTEROL SULFATE HFA 108 (90 BASE) MCG/ACT IN AERS: 2.0000 | INHALATION_SPRAY | RESPIRATORY_TRACT | Status: DC | PRN

## 2014-08-14 ENCOUNTER — Encounter: Payer: Self-pay | Admitting: Pulmonary Disease

## 2014-08-14 ENCOUNTER — Ambulatory Visit (INDEPENDENT_AMBULATORY_CARE_PROVIDER_SITE_OTHER): Payer: Medicare Other | Admitting: Pulmonary Disease

## 2014-08-14 ENCOUNTER — Other Ambulatory Visit: Payer: Self-pay

## 2014-08-14 VITALS — BP 132/64 | HR 55 | Ht 63.0 in | Wt 161.0 lb

## 2014-08-14 DIAGNOSIS — J309 Allergic rhinitis, unspecified: Secondary | ICD-10-CM | POA: Diagnosis not present

## 2014-08-14 DIAGNOSIS — Z22322 Carrier or suspected carrier of Methicillin resistant Staphylococcus aureus: Secondary | ICD-10-CM

## 2014-08-14 DIAGNOSIS — J301 Allergic rhinitis due to pollen: Secondary | ICD-10-CM

## 2014-08-14 DIAGNOSIS — J439 Emphysema, unspecified: Secondary | ICD-10-CM

## 2014-08-14 DIAGNOSIS — J9611 Chronic respiratory failure with hypoxia: Secondary | ICD-10-CM | POA: Diagnosis not present

## 2014-08-14 DIAGNOSIS — R0902 Hypoxemia: Secondary | ICD-10-CM | POA: Diagnosis not present

## 2014-08-14 MED ORDER — BUDESONIDE 0.25 MG/2ML IN SUSP: 0.5000 mg | Freq: Two times a day (BID) | RESPIRATORY_TRACT | Status: DC

## 2014-08-14 NOTE — Assessment & Plan Note (Signed)
She continues to struggle with green mucus production on a daily basis. Today she is not wheezing nor any distress. Respiratory distress but she does have very severe COPD. I'm concerned that she may have an atypical infection such as MAI.   Plan: -Continue Spiriva and Pulmicort -Check sputum AFB 3 in sputum fungus and sputum bacterial culture

## 2014-08-14 NOTE — Progress Notes (Signed)
Subjective:    [name], female    DOB: Apr 01, 1941, 74 y.o.   MRN: [id number]  Synopsis: [name] has gold stage IV COPD and has followed with Dr. Halford Chessman in [place] since around 2007. She uses 4 L of oxygen with exertion and 3 L at rest. She participates in pulmonary rehabilitation at [facility]. Because she lives in [place] she switched her care to Dr. Lake Bells in may of 2014.  HPI  Chief Complaint  Patient presents with  . Follow-up    pt c/o sob with exertion, prod cough with green mucus X1 month.     Her breathing has been OK but she has had a lot of allergy symptoms. Green mucus nearly every day, usually in the mornings.  It gets better with antibiotics. She says that the Spiriva really helps.  She continues to take the pulmicort. She has been having a significant amount of dryness in her nose lately. She has continued to use Bactroban ointment. She continues to use and benefit from her oxygen. She gets a little short of breath but typically she is able to pace herself and not too much problems with dyspnea.  Past Medical History  Diagnosis Date  . Diverticular disease 10/09/09    CT abd no sigmoid mass DDD L5/S1  . Peptic ulcer   . GERD (gastroesophageal reflux disease)   . Hiatal hernia   . Hypertension   . Dyslipidemia   . Chronic cystitis     recurrent UTI (4+ in last year), on suppressive doxy (uro Cope)  . Hematuria     h/o kidney stones  . COPD, severe 01/27/07       . Lung cancer 1987  . Spinal abscess 2000    secondary to periodontal disease  . Osteoarthritis   . Depression with anxiety   . Osteopenia 08/2011    DEXA: L femur -1.6  . Left scapholunate ligament tear 2014    found on imaging  . Colon polyp         Review of Systems  Constitutional: Negative for fever, chills and unexpected weight change.  HENT: Positive for postnasal drip. Negative for congestion, dental problem, ear pain, nosebleeds,  rhinorrhea, sinus pressure, sneezing, sore throat, trouble swallowing and voice change.   Respiratory: Positive for cough and shortness of breath. Negative for choking.   Cardiovascular: Negative for chest pain and leg swelling.       Objective:   Physical Exam Filed Vitals:   08/14/14 1200  BP: 132/64  Pulse: 55  Height: 5\' 3"  (1.6 m)  Weight: 161 lb (73.029 kg)  SpO2: 95%  On 3 L [place]  Gen: well appearing, no acute distress HEENT: NCAT,  EOMi, OP clear,  PULM: Clear to auscultation bilaterally CV: RRR, no mgr, no JVD AB: BS+, soft, nontender, no hsm Ext: warm, no edema, no clubbing, no cyanosis  Recent primary care records reviewed    Assessment & Plan:   Chronic respiratory failure with hypoxia Continue 3 L of oxygen continuously   GOLD 4 COPD with emphysema She continues to struggle with green mucus production on a daily basis. Today she is not wheezing nor any distress. Respiratory distress but she does have very severe COPD. I'm concerned that she may have an atypical infection such as MAI.   Plan: -Continue Spiriva and Pulmicort -Check sputum AFB 3 in sputum fungus and sputum bacterial culture   Allergic rhinitis This is becoming more of a problem  for her again. Today we spent his time discussing the importance of adherence to Nasacort.  Plan: -Continue Claritin -Start using Nasacort she was educated on appropriate use   MRSA colonization She completed a course of Bactroban but unfortunately she has continued to use it for more than 5 days I recommended. She has dryness in her nose which I think is related to Bactroban ointment.  Plan: -Stop Bactroban Use saline gel     Updated Medication List Outpatient Encounter Prescriptions as of 08/14/2014  Medication Sig  . albuterol (PROAIR HFA) 108 (90 BASE) MCG/ACT inhaler Inhale 2 puffs into the lungs every 4 (four) hours as needed.  Marland Kitchen albuterol (PROVENTIL) (2.5 MG/3ML) 0.083% nebulizer solution Take 3 mLs  (2.5 mg total) by nebulization 4 (four) times daily.  . budesonide (PULMICORT) 0.25 MG/2ML nebulizer solution Take 4 mLs (0.5 mg total) by nebulization 2 (two) times daily.  Marland Kitchen buPROPion (WELLBUTRIN SR) 150 MG 12 hr tablet TAKE ONE TABLET BY MOUTH TWICE DAILY  . CRANBERRY PO Take 2 tablets by mouth every morning.   . diphenhydrAMINE (BENADRYL) 25 mg capsule Take 25 mg by mouth at bedtime as needed for itching or sleep.   Marland Kitchen gabapentin (NEURONTIN) 100 MG capsule Take 100 mg by mouth at bedtime as needed.   Marland Kitchen guaifenesin (HUMIBID E) 400 MG TABS Take 400 mg by mouth every morning.   . loratadine (CLARITIN) 10 MG tablet Take 10 mg by mouth every morning.   Marland Kitchen losartan (COZAAR) 50 MG tablet TAKE ONE TABLET BY MOUTH EVERY DAY  . metoprolol tartrate (LOPRESSOR) 25 MG tablet TAKE ONE TABLET BY MOUTH TWICE DAILY  . Multiple Vitamin (MULTIVITAMIN) tablet Take 1 tablet by mouth every morning.   . mupirocin nasal ointment (BACTROBAN NASAL) 2 % Place 1 application into the nose 2 (two) times daily. Use one-half of tube in each nostril twice daily for five (5) days. After application, press sides of nose together and gently massage.  Marland Kitchen MYRBETRIQ 25 MG TB24 tablet TAKE ONE TABLET BY MOUTH EVERY DAY  . NON FORMULARY Oxygen 4 liters daily and 3 liters at night  . omeprazole (PRILOSEC) 40 MG capsule TAKE ONE CAPSULE BY MOUTH EVERY DAY  . oxybutynin (DITROPAN) 5 MG tablet TAKE ONE TABLET BY MOUTH TWICE DAILY  . pravastatin (PRAVACHOL) 80 MG tablet ONE TABLET BY MOUTH AT BEDTIME  . Probiotic Product (PROBIOTIC DAILY PO) Take 1 capsule by mouth daily.  . psyllium (METAMUCIL) 58.6 % powder Take 1 packet by mouth at bedtime.   . sertraline (ZOLOFT) 100 MG tablet TAKE ONE AND ONE-HALF TABLETS AT BEDTIME (Patient taking differently: one tablet qhs)  . tiotropium (SPIRIVA) 18 MCG inhalation capsule Place 1 capsule (18 mcg total) into inhaler and inhale daily. Pt needs office visit before future refills.  Marland Kitchen tiZANidine  (ZANAFLEX) 4 MG tablet Take 4 mg by mouth as needed.   . vitamin B-12 (CYANOCOBALAMIN) 1000 MCG tablet Take 1,000 mcg by mouth daily.  . [DISCONTINUED] budesonide (PULMICORT) 0.25 MG/2ML nebulizer solution Take 2 mLs (0.25 mg total) by nebulization 2 (two) times daily.  . [DISCONTINUED] Calcium Carb-Cholecalciferol (CALCIUM-VITAMIN D) 600-400 MG-UNIT TABS Take 1 tablet by mouth 2 (two) times daily.

## 2014-08-14 NOTE — Assessment & Plan Note (Signed)
This is becoming more of a problem for her again. Today we spent his time discussing the importance of adherence to Nasacort.  Plan: -Continue Claritin -Start using Nasacort she was educated on appropriate use

## 2014-08-14 NOTE — Patient Instructions (Signed)
Take the pulmicort nebulizer and the Spiriva as you are doing Give [place] three samples of the mucus Use nasacort every day Use saline gel for your dry sinuses Stop using the bactroban ointment We will see you back in 2-3 months

## 2014-08-14 NOTE — Assessment & Plan Note (Signed)
Continue 3 L of oxygen continuously

## 2014-08-14 NOTE — Assessment & Plan Note (Signed)
She completed a course of Bactroban but unfortunately she has continued to use it for more than 5 days I recommended. She has dryness in her nose which I think is related to Bactroban ointment.  Plan: -Stop Bactroban Use saline gel

## 2014-08-27 ENCOUNTER — Other Ambulatory Visit: Payer: Self-pay | Admitting: Pulmonary Disease

## 2014-08-27 DIAGNOSIS — J439 Emphysema, unspecified: Secondary | ICD-10-CM | POA: Diagnosis not present

## 2014-08-28 ENCOUNTER — Other Ambulatory Visit: Payer: Self-pay | Admitting: Pulmonary Disease

## 2014-08-28 ENCOUNTER — Other Ambulatory Visit: Payer: Self-pay | Admitting: *Deleted

## 2014-08-28 DIAGNOSIS — J439 Emphysema, unspecified: Secondary | ICD-10-CM | POA: Diagnosis not present

## 2014-08-29 ENCOUNTER — Telehealth: Payer: Self-pay | Admitting: *Deleted

## 2014-08-29 NOTE — Telephone Encounter (Signed)
She dropped them off yesterday but [facility]  says there are 3 cups and each need to be on different days, not knowing what the day of collection was, i will call pt again

## 2014-08-29 NOTE — Telephone Encounter (Signed)
[facility] called that they need to know what days the sputum was collect it wasn't written on the cups, called pt didn't get an answer

## 2014-08-29 NOTE — Telephone Encounter (Signed)
We have no way of knowing when she dropped off the cups.  She was seen on 3/23 in our new office and picked the cups from [place] station sometime after that.

## 2014-08-29 NOTE — Telephone Encounter (Signed)
Spoke to pt about when they were collect

## 2014-08-31 LAB — RESPIRATORY CULTURE OR RESPIRATORY AND SPUTUM CULTURE
Culture: NORMAL
GRAM STAIN: NONE SEEN
Gram Stain: NONE SEEN
Organism ID, Bacteria: NORMAL

## 2014-09-04 ENCOUNTER — Other Ambulatory Visit: Payer: Self-pay | Admitting: Family Medicine

## 2014-09-10 NOTE — Op Note (Signed)
PATIENT NAME:  [name] [name], [name] [name] MR#:  [id number] DATE OF BIRTH:  [birth year]  DATE OF PROCEDURE:  12/20/2011  PREOPERATIVE DIAGNOSIS: Visually significant cataract of the right eye.   POSTOPERATIVE DIAGNOSIS: Visually significant cataract of the right eye.   OPERATIVE PROCEDURE: Cataract extraction by phacoemulsification with implant of intraocular lens to the right eye.   SURGEON: Birder Robson, MD.   ANESTHESIA:  1. Managed anesthesia care.  2. Topical tetracaine drops followed by 2% Xylocaine jelly applied in the preoperative holding area.   COMPLICATIONS: None.   TECHNIQUE:  Stop and chop.   DESCRIPTION OF PROCEDURE: The patient was examined and consented in the preoperative holding area where the aforementioned topical anesthesia was applied to the right eye and then brought back to the operating room where the right eye was prepped and draped in the usual sterile ophthalmic fashion and a lid speculum was placed. A paracentesis was created with the side port blade and the anterior chamber was filled with viscoelastic. A near clear corneal incision was performed with the steel keratome. A continuous curvilinear capsulorrhexis was performed with a cystotome followed by the capsulorrhexis forceps. Hydrodissection and hydrodelineation were carried out with BSS on a blunt cannula. The lens was removed in a stop and chop technique and the remaining cortical material was removed with the irrigation-aspiration handpiece. The capsular bag was inflated with viscoelastic and the Tecnis ZCB00 22-diopter lens, serial number [id number] was placed in the capsular bag without complication. The remaining viscoelastic was removed from the eye with the irrigation-aspiration handpiece. The wounds were hydrated. The anterior chamber was flushed with Miostat and the eye was inflated to physiologic pressure. The wounds were found to be water tight. The eye was dressed with Vigamox. The patient was given  protective glasses to wear throughout the day and a shield with which to sleep tonight. The patient was also given drops with which to begin a drop regimen today and will follow up with me in one day.    ____________________________ Livingston Diones. Rozelia Catapano, MD wlp:bjt D: 12/20/2011 20:09:05 ET T: 12/21/2011 09:37:04 ET JOB#: [id number]  cc: Yer Castello L. Abbagail Scaff, MD, <Dictator> Livingston Diones Sybrina Laning MD ELECTRONICALLY SIGNED 12/28/2011 12:49

## 2014-09-14 DIAGNOSIS — J309 Allergic rhinitis, unspecified: Secondary | ICD-10-CM | POA: Diagnosis not present

## 2014-09-14 DIAGNOSIS — R0902 Hypoxemia: Secondary | ICD-10-CM | POA: Diagnosis not present

## 2014-09-15 NOTE — Op Note (Signed)
PATIENT NAME:  [name] [name], [name] [name] MR#:  [id number] DATE OF BIRTH:  1940-06-09  DATE OF PROCEDURE:  11/16/2011  PREOPERATIVE DIAGNOSIS: Visually significant cataract of the left eye.   POSTOPERATIVE DIAGNOSIS: Visually significant cataract of the left eye.   OPERATIVE PROCEDURE: Cataract extraction by phacoemulsification with implant of intraocular lens to left eye.   SURGEON: Birder Robson, MD.   ANESTHESIA:  1. Managed anesthesia care.  2. Topical tetracaine drops followed by 2% Xylocaine jelly applied in the preoperative holding area.   COMPLICATIONS: None.   TECHNIQUE:  Four-quadrant divide and conquer.  DESCRIPTION OF PROCEDURE: The patient was examined and consented in the preoperative holding area where the aforementioned topical anesthesia was applied to the left eye and then brought back to the Operating Room where the left eye was prepped and draped in the usual sterile ophthalmic fashion and a lid speculum was placed. A paracentesis was created with the side port blade and the anterior chamber was filled with viscoelastic. A near clear corneal incision was performed with the steel keratome. A continuous curvilinear capsulorrhexis was performed with a cystotome followed by the capsulorrhexis forceps. Hydrodissection and hydrodelineation were carried out with BSS on a blunt cannula. The lens was removed in a four-quadrant divide and conquer technique and the remaining cortical material was removed with the irrigation-aspiration handpiece. The capsular bag was inflated with viscoelastic and the Technis ZCBOO 21.5-diopter lens, serial number [id number], was placed in the capsular bag without complication. The remaining viscoelastic was removed from the eye with the irrigation-aspiration handpiece. The wounds were hydrated. The anterior chamber was flushed with Miostat and the eye was inflated to physiologic pressure. The wounds were found to be water tight. The eye was dressed with  Vigamox. The patient was given protective glasses to wear throughout the day and a shield with which to sleep tonight. The patient was also given drops with which to begin a drop regimen today and will follow-up with me in one day.    ____________________________ Livingston Diones. Luberta Grabinski, MD wlp:cbb D: 11/16/2011 11:18:43 ET T: 11/16/2011 11:31:40 ET JOB#: [id number]  cc: Ellia Knowlton L. Shayley Medlin, MD, <Dictator> Livingston Diones Olubunmi Rothenberger MD ELECTRONICALLY SIGNED 11/17/2011 15:07

## 2014-09-16 ENCOUNTER — Other Ambulatory Visit: Payer: Self-pay | Admitting: Family Medicine

## 2014-09-22 LAB — FUNGUS CULTURE W SMEAR

## 2014-09-25 ENCOUNTER — Encounter: Payer: Self-pay | Admitting: Internal Medicine

## 2014-10-05 ENCOUNTER — Other Ambulatory Visit: Payer: Self-pay | Admitting: Internal Medicine

## 2014-10-05 ENCOUNTER — Other Ambulatory Visit: Payer: Self-pay | Admitting: Family Medicine

## 2014-10-07 NOTE — Telephone Encounter (Signed)
Rx sent to the pharmacy by e-script.//AB/CMA 

## 2014-10-11 ENCOUNTER — Telehealth: Payer: Self-pay | Admitting: Pulmonary Disease

## 2014-10-11 LAB — AFB CULTURE WITH SMEAR (NOT AT ARMC)
ACID FAST SMEAR: NONE SEEN
Acid Fast Smear: NONE SEEN

## 2014-10-11 NOTE — Telephone Encounter (Signed)
Spoke with Willette Cluster at [facility] [place] Sputum culture has returned positive for AFB [facility]  is sending off culture to identity  Will have a final result in about 4-5 weeks.  Will send to Dr Lake Bells as Juluis Rainier.  Will send to [facility] to follow up on.

## 2014-10-14 DIAGNOSIS — R0902 Hypoxemia: Secondary | ICD-10-CM | POA: Diagnosis not present

## 2014-10-14 DIAGNOSIS — J309 Allergic rhinitis, unspecified: Secondary | ICD-10-CM | POA: Diagnosis not present

## 2014-10-18 NOTE — Telephone Encounter (Signed)
[name], please advise if this message can be closed.

## 2014-10-23 ENCOUNTER — Ambulatory Visit (INDEPENDENT_AMBULATORY_CARE_PROVIDER_SITE_OTHER): Payer: Medicare Other | Admitting: Pulmonary Disease

## 2014-10-23 ENCOUNTER — Encounter: Payer: Self-pay | Admitting: Pulmonary Disease

## 2014-10-23 VITALS — BP 126/60 | HR 71 | Ht 63.0 in | Wt 162.0 lb

## 2014-10-23 DIAGNOSIS — J438 Other emphysema: Secondary | ICD-10-CM

## 2014-10-23 DIAGNOSIS — A310 Pulmonary mycobacterial infection: Secondary | ICD-10-CM | POA: Diagnosis not present

## 2014-10-23 MED ORDER — FLUTTER DEVI: Status: DC

## 2014-10-23 NOTE — Patient Instructions (Signed)
We will order a high-resolution CT scan of your chest to look for scarring in her lungs Please bring [place] to more samples of your mucus We will order a portable oxygen concentrator Continue to use the guaifenesin as you're doing, I recommend that you use the flutter valve we have given you 3-4 times a day to help reduce mucus We will see you back in 2 months or sooner if needed

## 2014-10-23 NOTE — Progress Notes (Signed)
Subjective:    [name], female    DOB: 1940-11-19, 74 y.o.   MRN: [id number]  Synopsis: [name] [name] has gold stage IV COPD and has followed with Dr. Halford [name] in [place] since around 2007. She uses 4 L of oxygen with exertion and 3 L at rest. She participates in pulmonary rehabilitation at [facility]. Because she lives in [place] she switched her care to Dr. Lake [name] in may of 2014.  HPI  Chief Complaint  Patient presents with  . Follow-up    Pt her for f/u when she coughs which is seldom she still see green mucus.She is on 3 L 02 Pulse. Pt denies chest tightness and wheezing.   No fevers, no night sweats. However she continues to have green sputum on a daily basis.  She says less than a teaspoon.  She continues to be fatigued all the time.  Sleeping is OK.  She only has exertional dyspnea.  She has slowed herself down because of breathing, she is not going shopping.   Past Medical History  Diagnosis Date  . Diverticular disease 10/09/09    CT abd no sigmoid mass DDD L5/S1  . Peptic ulcer   . GERD (gastroesophageal reflux disease)   . Hiatal hernia   . Hypertension   . Dyslipidemia   . Chronic cystitis     recurrent UTI (4+ in last year), on suppressive doxy (uro Cope)  . Hematuria     h/o kidney stones  . COPD, severe 01/27/07       . Lung cancer 1987  . Spinal abscess 2000    secondary to periodontal disease  . Osteoarthritis   . Depression with anxiety   . Osteopenia 08/2011    DEXA: L femur -1.6  . Left scapholunate ligament tear 2014    found on imaging  . Colon polyp         Review of Systems  Constitutional: Negative for fever, chills and unexpected weight change.  HENT: Positive for postnasal drip. Negative for congestion, dental problem, ear pain, nosebleeds, rhinorrhea, sinus pressure, sneezing, sore throat, trouble swallowing and voice change.   Respiratory: Positive for cough and shortness of breath. Negative  for choking.   Cardiovascular: Negative for chest pain and leg swelling.       Objective:   Physical Exam Filed Vitals:   10/23/14 1400  BP: 126/60  Pulse: 71  Height: '5\' 3"'$  (1.6 m)  Weight: 162 lb (73.483 kg)  SpO2: 92%  On 3 L [place]  Gen: well appearing, no acute distress HEENT: NCAT,  EOMi, OP clear,  PULM: Clear to auscultation bilaterally CV: RRR, no mgr, no JVD AB: BS+, soft, nontender, no hsm Ext: warm, no edema, no clubbing, no cyanosis  Sputum cultures 08/2014> MAI positive in 1/22 November 2013 CXR reviewed> emphysema, no scarring    Assessment & Plan:   GOLD 4 COPD with emphysema She continues to struggle from cough with mucus production but she has not had an exacerbation of COPD since last visit. We identified MAI in her mucus, see below.  Plan: Continue Pulmicort, Spiriva, and albuterol    Chronic respiratory failure with hypoxia We will order a portable oxygen concentrator, she should continue using 3 L while awake and 4 L while asleep   MAI (mycobacterium avium-intracellulare) This is a new problem for her. We identified MAI and 12 sputum cultures. Technically by ATS criteria with only one positive sputum culture she needs to have evidence  of fibrocavitary disease or bronchiectasis on CT scan OR she needs to have more than 2 sputum samples positive for MAI or a BAL culture positive for MAI.  Today I explained briefly about the treatment for MAI. Before we will consider that I would like to see the results of his CT scan and further sputum cultures. If she has significant bronchiectasis or cavitary disease or multiple positive sputum cultures then we will consider treatment.  Plan: Repeat sputum AFB 2 High-resolution CT chest     Updated Medication List Outpatient Encounter Prescriptions as of 10/23/2014  Medication Sig  . albuterol (PROAIR HFA) 108 (90 BASE) MCG/ACT inhaler Inhale 2 puffs into the lungs every 4 (four) hours as needed.  Marland Kitchen albuterol  (PROVENTIL) (2.5 MG/3ML) 0.083% nebulizer solution Take 3 mLs (2.5 mg total) by nebulization 4 (four) times daily.  . budesonide (PULMICORT) 0.25 MG/2ML nebulizer solution Take 4 mLs (0.5 mg total) by nebulization 2 (two) times daily.  Marland Kitchen buPROPion (WELLBUTRIN SR) 150 MG 12 hr tablet TAKE ONE TABLET BY MOUTH TWICE DAILY  . CRANBERRY PO Take 2 tablets by mouth every morning.   . gabapentin (NEURONTIN) 100 MG capsule Take 100 mg by mouth at bedtime as needed.   Marland Kitchen guaifenesin (HUMIBID E) 400 MG TABS Take 400 mg by mouth every morning.   . loratadine (CLARITIN) 10 MG tablet Take 10 mg by mouth every morning.   Marland Kitchen losartan (COZAAR) 50 MG tablet TAKE ONE TABLET BY MOUTH EVERY DAY  . metoprolol tartrate (LOPRESSOR) 25 MG tablet TAKE ONE TABLET BY MOUTH TWICE DAILY  . MYRBETRIQ 25 MG TB24 tablet TAKE ONE TABLET BY MOUTH EVERY DAY  . NON FORMULARY Oxygen 4 liters daily and 3 liters at night  . omeprazole (PRILOSEC) 40 MG capsule TAKE ONE CAPSULE BY MOUTH EVERY DAY  . oxybutynin (DITROPAN) 5 MG tablet TAKE ONE TABLET BY MOUTH TWICE DAILY  . pravastatin (PRAVACHOL) 80 MG tablet ONE TABLET BY MOUTH AT BEDTIME  . Probiotic Product (PROBIOTIC DAILY PO) Take 1 capsule by mouth daily.  . psyllium (METAMUCIL) 58.6 % powder Take 1 packet by mouth at bedtime.   . sertraline (ZOLOFT) 100 MG tablet TAKE ONE AND ONE-HALF TABLETS AT BEDTIME (Patient taking differently: one tablet qhs)  . SPIRIVA HANDIHALER 18 MCG inhalation capsule INHALE THE CONTENTS OF 1 CAPSULE ONCE DAILY *NOT FOR ORAL USE*  . tiZANidine (ZANAFLEX) 4 MG tablet Take 4 mg by mouth as needed.   . vitamin B-12 (CYANOCOBALAMIN) 1000 MCG tablet Take 1,000 mcg by mouth daily.  Marland Kitchen Respiratory Therapy Supplies (FLUTTER) DEVI Use as directed  . [DISCONTINUED] diphenhydrAMINE (BENADRYL) 25 mg capsule Take 25 mg by mouth at bedtime as needed for itching or sleep.   . [DISCONTINUED] losartan (COZAAR) 50 MG tablet TAKE ONE TABLET BY MOUTH EVERY DAY (Patient not  taking: Reported on 10/23/2014)  . [DISCONTINUED] Multiple Vitamin (MULTIVITAMIN) tablet Take 1 tablet by mouth every morning.   . [DISCONTINUED] mupirocin nasal ointment (BACTROBAN NASAL) 2 % Place 1 application into the nose 2 (two) times daily. Use one-half of tube in each nostril twice daily for five (5) days. After application, press sides of nose together and gently massage. (Patient not taking: Reported on 10/23/2014)   No facility-administered encounter medications on file as of 10/23/2014.

## 2014-10-23 NOTE — Assessment & Plan Note (Signed)
She continues to struggle from cough with mucus production but she has not had an exacerbation of COPD since last visit. We identified MAI in her mucus, see below.  Plan: Continue Pulmicort, Spiriva, and albuterol

## 2014-10-23 NOTE — Assessment & Plan Note (Signed)
This is a new problem for her. We identified MAI and 12 sputum cultures. Technically by ATS criteria with only one positive sputum culture she needs to have evidence of fibrocavitary disease or bronchiectasis on CT scan OR she needs to have more than 2 sputum samples positive for MAI or a BAL culture positive for MAI.  Today I explained briefly about the treatment for MAI. Before we will consider that I would like to see the results of his CT scan and further sputum cultures. If she has significant bronchiectasis or cavitary disease or multiple positive sputum cultures then we will consider treatment.  Plan: Repeat sputum AFB 2 High-resolution CT chest

## 2014-10-23 NOTE — Assessment & Plan Note (Signed)
We will order a portable oxygen concentrator, she should continue using 3 L while awake and 4 L while asleep

## 2014-10-25 LAB — REFERRED ASSAY

## 2014-10-28 DIAGNOSIS — L7211 Pilar cyst: Secondary | ICD-10-CM | POA: Diagnosis not present

## 2014-10-28 DIAGNOSIS — L299 Pruritus, unspecified: Secondary | ICD-10-CM | POA: Diagnosis not present

## 2014-10-28 DIAGNOSIS — L853 Xerosis cutis: Secondary | ICD-10-CM | POA: Diagnosis not present

## 2014-10-30 ENCOUNTER — Other Ambulatory Visit: Payer: Self-pay | Admitting: *Deleted

## 2014-10-30 ENCOUNTER — Ambulatory Visit
Admission: RE | Admit: 2014-10-30 | Discharge: 2014-10-30 | Disposition: A | Discharge status: Home / Self Care | Payer: Medicare Other | Source: Ambulatory Visit | Attending: Pulmonary Disease | Admitting: Pulmonary Disease

## 2014-10-30 ENCOUNTER — Other Ambulatory Visit: Payer: Self-pay | Admitting: Pulmonary Disease

## 2014-10-30 VITALS — Ht 63.5 in | Wt 165.0 lb

## 2014-10-30 DIAGNOSIS — I2510 Atherosclerotic heart disease of native coronary artery without angina pectoris: Secondary | ICD-10-CM | POA: Diagnosis not present

## 2014-10-30 DIAGNOSIS — R05 Cough: Secondary | ICD-10-CM | POA: Diagnosis not present

## 2014-10-30 DIAGNOSIS — J438 Other emphysema: Secondary | ICD-10-CM

## 2014-10-30 DIAGNOSIS — J439 Emphysema, unspecified: Secondary | ICD-10-CM | POA: Diagnosis not present

## 2014-10-30 DIAGNOSIS — J432 Centrilobular emphysema: Secondary | ICD-10-CM

## 2014-10-30 DIAGNOSIS — Z85118 Personal history of other malignant neoplasm of bronchus and lung: Secondary | ICD-10-CM | POA: Diagnosis not present

## 2014-10-30 DIAGNOSIS — J984 Other disorders of lung: Secondary | ICD-10-CM | POA: Diagnosis not present

## 2014-11-02 LAB — AFB CULTURE WITH SMEAR (NOT AT ARMC): Acid Fast Smear: NONE SEEN

## 2014-11-12 ENCOUNTER — Telehealth: Payer: Self-pay

## 2014-11-12 LAB — AFB CULTURE WITH SMEAR (NOT AT ARMC): Acid Fast Smear: NONE SEEN

## 2014-11-12 NOTE — Telephone Encounter (Signed)
Shimoindi? I'm going to have to look that one up.  Nothing to do for now

## 2014-11-12 NOTE — Telephone Encounter (Signed)
Critical lab call from [facility]: AFB culture possible for microbacterial Shimoindi.   Results being faxed to main fax #.  Forwarding to BQ to make aware.

## 2014-11-14 DIAGNOSIS — R0902 Hypoxemia: Secondary | ICD-10-CM | POA: Diagnosis not present

## 2014-11-14 DIAGNOSIS — J309 Allergic rhinitis, unspecified: Secondary | ICD-10-CM | POA: Diagnosis not present

## 2014-11-26 LAB — CP MYCOBACTERIAL IDENTIFICATION

## 2014-12-13 LAB — AFB CULTURE WITH SMEAR (NOT AT ARMC): Acid Fast Smear: NONE SEEN

## 2014-12-14 DIAGNOSIS — R0902 Hypoxemia: Secondary | ICD-10-CM | POA: Diagnosis not present

## 2014-12-14 DIAGNOSIS — J309 Allergic rhinitis, unspecified: Secondary | ICD-10-CM | POA: Diagnosis not present

## 2014-12-17 ENCOUNTER — Telehealth: Payer: Self-pay

## 2014-12-17 NOTE — Telephone Encounter (Signed)
Called to notify patient of being due for a Mammogram. Patient would like to schedule an appointment with [facility] at [facility]. I provided them with the contact information, and patient stated that she would call and set up an appointment.

## 2014-12-18 ENCOUNTER — Ambulatory Visit (INDEPENDENT_AMBULATORY_CARE_PROVIDER_SITE_OTHER): Payer: Medicare Other | Admitting: Internal Medicine

## 2014-12-18 ENCOUNTER — Encounter: Payer: Self-pay | Admitting: Internal Medicine

## 2014-12-18 VITALS — BP 120/72 | HR 58 | Temp 97.9°F | Wt 164.0 lb

## 2014-12-18 DIAGNOSIS — J309 Allergic rhinitis, unspecified: Secondary | ICD-10-CM

## 2014-12-18 DIAGNOSIS — R51 Headache: Secondary | ICD-10-CM | POA: Diagnosis not present

## 2014-12-18 DIAGNOSIS — R3915 Urgency of urination: Secondary | ICD-10-CM

## 2014-12-18 DIAGNOSIS — R519 Headache, unspecified: Secondary | ICD-10-CM

## 2014-12-18 LAB — POCT URINALYSIS DIPSTICK
Bilirubin, UA: NEGATIVE
Blood, UA: NEGATIVE
Glucose, UA: NEGATIVE
Ketones, UA: NEGATIVE
Leukocytes, UA: NEGATIVE
Nitrite, UA: NEGATIVE
Protein, UA: NEGATIVE
Spec Grav, UA: 1.015
Urobilinogen, UA: NEGATIVE
pH, UA: 6

## 2014-12-18 NOTE — Progress Notes (Signed)
Pre visit review using our clinic review tool, if applicable. No additional management support is needed unless otherwise documented below in the visit note. 

## 2014-12-18 NOTE — Progress Notes (Signed)
Subjective:    [name], female    DOB: Oct 01, 1940, 74 y.o.   MRN: [id number]  HPI  Pt presents to the clinic today with c/o neck pain. She reports she has had this all of her life and flared up over this last weekend. The pain starts in her neck and radiates to the top of her head. She describes the pain as sharp and shooting. She reports it feels like "lightening". It is intermittent. It does not last long. She denies dizziness or changes in vision. She has taken her Aleve and Gabapentin with some relief. She denies an injury to her neck.  She also reports sneezing, runny nose, and watery eyes. This started 4 days ago. She is blowing yellow mucous out of her nose. The cough is productive of green mucous. She denies fever, chills or body aches. She denies worsening shortness of breath. She has taken Nasocort with some relief. She does have a history of seasonal allergies. She has not had sick contacts.  She also reports urinary urgency. This has been persistent over the last month. She denies dysuria, fever, chills or low back pain. She has had recurrent UTI's in the past. She is on Ditropan for OAB.    Review of Systems      Past Medical History  Diagnosis Date  . Diverticular disease 10/09/09    CT abd no sigmoid mass DDD L5/S1  . Peptic ulcer   . GERD (gastroesophageal reflux disease)   . Hiatal hernia   . Hypertension   . Dyslipidemia   . Chronic cystitis     recurrent UTI (4+ in last year), on suppressive doxy (uro Cope)  . Hematuria     h/o kidney stones  . COPD, severe 01/27/07       . Spinal abscess 2000    secondary to periodontal disease  . Osteoarthritis   . Depression with anxiety   . Osteopenia 08/2011    DEXA: L femur -1.6  . Left scapholunate ligament tear 2014    found on imaging  . Colon polyp   . Lung cancer 1987    Partial RLL Lobectomy.    Current Outpatient Prescriptions  Medication Sig Dispense Refill  . albuterol (PROAIR  HFA) 108 (90 BASE) MCG/ACT inhaler Inhale 2 puffs into the lungs every 4 (four) hours as needed. 1 Inhaler 5  . albuterol (PROVENTIL) (2.5 MG/3ML) 0.083% nebulizer solution Take 3 mLs (2.5 mg total) by nebulization 4 (four) times daily. 75 mL 12  . budesonide (PULMICORT) 0.25 MG/2ML nebulizer solution Take 4 mLs (0.5 mg total) by nebulization 2 (two) times daily. 60 mL 12  . buPROPion (WELLBUTRIN SR) 150 MG 12 hr tablet TAKE ONE TABLET BY MOUTH TWICE DAILY 60 tablet 3  . CRANBERRY PO Take 2 tablets by mouth every morning.     . gabapentin (NEURONTIN) 100 MG capsule Take 100 mg by mouth at bedtime as needed.     Marland Kitchen guaifenesin (HUMIBID E) 400 MG TABS Take 400 mg by mouth every morning.     . loratadine (CLARITIN) 10 MG tablet Take 10 mg by mouth every morning.     Marland Kitchen losartan (COZAAR) 50 MG tablet TAKE ONE TABLET BY MOUTH EVERY DAY 30 tablet 3  . metoprolol tartrate (LOPRESSOR) 25 MG tablet TAKE ONE TABLET BY MOUTH TWICE DAILY 60 tablet 11  . MYRBETRIQ 25 MG TB24 tablet TAKE ONE TABLET BY MOUTH EVERY DAY 30 tablet 0  . NON FORMULARY  Oxygen 4 liters daily and 3 liters at night    . omeprazole (PRILOSEC) 40 MG capsule TAKE ONE CAPSULE BY MOUTH EVERY DAY 30 capsule 3  . oxybutynin (DITROPAN) 5 MG tablet TAKE ONE TABLET BY MOUTH TWICE DAILY 60 tablet 3  . pravastatin (PRAVACHOL) 80 MG tablet ONE TABLET BY MOUTH AT BEDTIME 30 tablet 11  . Probiotic Product (PROBIOTIC DAILY PO) Take 1 capsule by mouth daily.    . psyllium (METAMUCIL) 58.6 % powder Take 1 packet by mouth at bedtime.     Marland Kitchen Respiratory Therapy Supplies (FLUTTER) DEVI Use as directed 1 each 0  . sertraline (ZOLOFT) 100 MG tablet TAKE ONE AND ONE-HALF TABLETS AT BEDTIME (Patient taking differently: one tablet qhs) 45 tablet 6  . SPIRIVA HANDIHALER 18 MCG inhalation capsule INHALE THE CONTENTS OF 1 CAPSULE ONCE DAILY *NOT FOR ORAL USE* 30 capsule 1  . tiZANidine (ZANAFLEX) 4 MG tablet Take 4 mg by mouth as needed.     . vitamin B-12  (CYANOCOBALAMIN) 1000 MCG tablet Take 1,000 mcg by mouth daily.     No current facility-administered medications for this visit.    Allergies  Allergen Reactions  . Keflex [Cephalexin] Shortness Of Breath  . Penicillins     REACTION: hives    Family History  Problem Relation Age of Onset  . Pancreatitis Mother   . Heart disease Father   . Hypertension Father   . Emphysema Father   . Arthritis Brother   . Hypertension Brother   . Cancer Maternal Aunt     ovarian cancer    History   Social History  . Marital Status: Married    Spouse Name: N/A  . Number of Children: 2  . Years of Education: N/A   Occupational History  . Retired     prior Glass blower/designer at [employer] History Main Topics  . Smoking status: Former Smoker -- 3.00 packs/day for 34 years    Types: Cigarettes    Quit date: 05/29/1995  . Smokeless tobacco: Never Used  . Alcohol Use: Yes     Comment: wine daily 2-3 glasses/day  . Drug Use: No  . Sexual Activity: Not on file   Other Topics Concern  . Not on file   Social History Narrative   Caffeine: 4 cups coffee   Remarried for the third time, 2nd husband died of heart attack. Two stepsons   Activity: Walks for exercise   Diet: good water, fruits/vegetables daily      Advanced directed - would want HCPOA to be husband, Karlton Lemon. Does not have set up - requests handout.     Constitutional: Pt reports headache. Denies fever, malaise, fatigue,  or abrupt weight changes.  HEENT: Pt reports nasal congestion. Denies eye pain, eye redness, ear pain, ringing in the ears, wax buildup, runny nose,  bloody nose, or sore throat. Respiratory: Pt reports cough. Denies difficulty breathing, shortness of breath, or sputum production.   Cardiovascular: Denies chest pain, chest tightness, palpitations or swelling in the hands or feet.  Gastrointestinal: Denies abdominal pain, bloating, constipation, diarrhea or blood in the stool.  GU: Pt reports  urgency. Denies frequency, pain with urination, burning sensation, blood in urine, odor or discharge. Neurological: Denies dizziness, difficulty with memory, difficulty with speech or problems with balance and coordination.   No other specific complaints in a complete review of systems (except as listed in HPI above).  Objective:   Physical Exam   BP 120/72  mmHg  Pulse 58  Temp(Src) 97.9 F (36.6 C) (Oral)  Wt 164 lb (74.39 kg)  SpO2 96% Wt Readings from Last 3 Encounters:  12/18/14 164 lb (74.39 kg)  10/30/14 165 lb (74.844 kg)  10/23/14 162 lb (73.483 kg)    General: Appears her stated age, chronically ill appearing in NAD. HEENT: Head: normal shape and size; Eyes: sclera white, no icterus, conjunctiva pink, PERRLA and EOMs intact; Ears: Tm's gray and intact, normal light reflex; Nose: mucosa bogyy and moist, septum midline; Throat/Mouth: Teeth present, mucosa pink and moist, + PND, no exudate, lesions or ulcerations noted.  Neck: No adenopathy noted.  Cardiovascular: Normal rate and rhythm. S1,S2 noted.   Pulmonary/Chest: Normal effort and coarsepositive vesicular breath sounds. No respiratory distress. No wheezes, rales or ronchi noted.  Abdomen: Soft and nontender. Normal bowel sounds, no bruits noted. No CVA tenderness. Neurological: Alert and oriented.   BMET    Component Value Date/Time   NA 139 02/13/2014 0907   NA 138 07/24/2012 1313   K 4.3 02/13/2014 0907   K 4.3 07/24/2012 1313   CL 102 02/13/2014 0907   CL 107 07/24/2012 1313   CO2 30 02/13/2014 0907   CO2 28 07/24/2012 1313   GLUCOSE 117* 02/13/2014 0907   GLUCOSE 150* 07/24/2012 1313   BUN 16 02/13/2014 0907   BUN 16 07/24/2012 1313   CREATININE 1.0 02/13/2014 0907   CREATININE 0.71 07/24/2012 1313   CALCIUM 9.4 02/13/2014 0907   CALCIUM 8.5 07/24/2012 1313   GFRNONAA >60 07/24/2012 1313   GFRNONAA 84.13 10/07/2009 1240   GFRAA >60 07/24/2012 1313   GFRAA 80 06/17/2008 0945    Lipid Panel       Component Value Date/Time   CHOL 219* 02/13/2014 0907   TRIG 200.0* 02/13/2014 0907   HDL 71.30 02/13/2014 0907   CHOLHDL 3 02/13/2014 0907   VLDL 40.0 02/13/2014 0907   LDLCALC 108* 02/13/2014 0907    CBC    Component Value Date/Time   WBC 7.2 02/13/2014 0907   WBC 9.0 07/24/2012 1313   RBC 4.47 02/13/2014 0907   RBC 4.67 07/24/2012 1313   HGB 13.4 02/13/2014 0907   HGB 14.4 07/24/2012 1313   HCT 41.1 02/13/2014 0907   HCT 43.3 07/24/2012 1313   PLT 162.0 02/13/2014 0907   PLT 146* 07/24/2012 1313   MCV 91.9 02/13/2014 0907   MCV 93 07/24/2012 1313   MCH 30.8 07/24/2012 1313   MCHC 32.7 02/13/2014 0907   MCHC 33.2 07/24/2012 1313   RDW 14.1 02/13/2014 0907   RDW 13.1 07/24/2012 1313   LYMPHSABS 2.2 02/13/2014 0907   MONOABS 0.4 02/13/2014 0907   EOSABS 0.2 02/13/2014 0907   BASOSABS 0.0 02/13/2014 0907    Hgb A1C Lab Results  Component Value Date   HGBA1C 5.5 02/13/2014        Assessment & Plan:   Headache:  Unchanged from prior She is getting relief with Aleve and Neurontin Will continue current treatment plan at this time  Allergic Rhinitis:  Good relief with Claritin and Nasocort No s/s of infection at this time Will continue monitor  Urinary urgency:  Urinalysis: normal Likely r/t AOB Continue Ditropan  RTC as needed or if symptoms persist or worsen

## 2014-12-24 ENCOUNTER — Other Ambulatory Visit: Payer: Self-pay | Admitting: Family Medicine

## 2015-01-01 ENCOUNTER — Other Ambulatory Visit: Payer: Self-pay | Admitting: Family Medicine

## 2015-01-06 ENCOUNTER — Other Ambulatory Visit: Payer: Self-pay | Admitting: Primary Care

## 2015-01-06 ENCOUNTER — Encounter: Payer: Self-pay | Admitting: Primary Care

## 2015-01-06 ENCOUNTER — Ambulatory Visit (INDEPENDENT_AMBULATORY_CARE_PROVIDER_SITE_OTHER): Payer: Medicare Other | Admitting: Primary Care

## 2015-01-06 VITALS — BP 126/76 | HR 65 | Temp 97.5°F | Ht 63.0 in | Wt 160.0 lb

## 2015-01-06 DIAGNOSIS — R319 Hematuria, unspecified: Secondary | ICD-10-CM

## 2015-01-06 DIAGNOSIS — N390 Urinary tract infection, site not specified: Secondary | ICD-10-CM | POA: Diagnosis not present

## 2015-01-06 LAB — POCT URINALYSIS DIPSTICK
BILIRUBIN UA: NEGATIVE
Glucose, UA: NEGATIVE
Ketones, UA: NEGATIVE
Nitrite, UA: POSITIVE
PH UA: 6
Spec Grav, UA: 1.015
Urobilinogen, UA: NEGATIVE

## 2015-01-06 MED ORDER — PHENAZOPYRIDINE HCL 200 MG PO TABS: 200.0000 mg | ORAL_TABLET | Freq: Three times a day (TID) | ORAL | Status: DC | PRN

## 2015-01-06 MED ORDER — SULFAMETHOXAZOLE-TRIMETHOPRIM 800-160 MG PO TABS: 1.0000 | ORAL_TABLET | Freq: Two times a day (BID) | ORAL | Status: DC

## 2015-01-06 NOTE — Progress Notes (Signed)
Subjective:    [name], female    DOB: 02/01/1941, 74 y.o.   MRN: [id number]  HPI  [name] [name] is a 74 year old female who presents today with a chief complaint of urinary frequency. She also reports dysuria and left sided flank pain. Her urinary frequency began about one week ago and the dysuria and flank pain began last Wednesday. She will take cranberry pills daily for prevention and has been out of the pills for a week or so. Denies fevers, abdominal pain, nausea; reports some chills.  Review of Systems  Constitutional: Positive for chills. Negative for fever.  Respiratory: Negative for shortness of breath.   Cardiovascular: Negative for chest pain.  Gastrointestinal: Negative for nausea and abdominal pain.  Genitourinary: Positive for dysuria, urgency, frequency and flank pain. Negative for hematuria and vaginal discharge.  Musculoskeletal: Positive for myalgias.       Past Medical History  Diagnosis Date  . Diverticular disease 10/09/09    CT abd no sigmoid mass DDD L5/S1  . Peptic ulcer   . GERD (gastroesophageal reflux disease)   . Hiatal hernia   . Hypertension   . Dyslipidemia   . Chronic cystitis     recurrent UTI (4+ in last year), on suppressive doxy (uro Cope)  . Hematuria     h/o kidney stones  . COPD, severe 01/27/07       . Spinal abscess 2000    secondary to periodontal disease  . Osteoarthritis   . Depression with anxiety   . Osteopenia 08/2011    DEXA: L femur -1.6  . Left scapholunate ligament tear 2014    found on imaging  . Colon polyp   . Lung cancer 1987    Partial RLL Lobectomy.    Social History   Social History  . Marital Status: Married    Spouse Name: N/A  . Number of Children: 2  . Years of Education: N/A   Occupational History  . Retired     prior Glass blower/designer at [employer] History Main Topics  . Smoking status: Former Smoker -- 3.00 packs/day for 34 years    Types: Cigarettes   Quit date: 05/29/1995  . Smokeless tobacco: Never Used  . Alcohol Use: Yes     Comment: wine daily 2-3 glasses/day  . Drug Use: No  . Sexual Activity: Not on file   Other Topics Concern  . Not on file   Social History Narrative   Caffeine: 4 cups coffee   Remarried for the third time, 2nd husband died of heart attack. Two stepsons   Activity: Walks for exercise   Diet: good water, fruits/vegetables daily      Advanced directed - would want HCPOA to be husband, Karlton Lemon. Does not have set up - requests handout.    Past Surgical History  Procedure Laterality Date  . Tubal ligation      bilateral  . Bladder surgery    . Breast biopsy  03/99    left  . Mouth surgery  [place] pneumonia sepsis due to gum surgery  . Cystoscopy  02/11/07 and 09/25/08    Dr. Jacqlyn Larsen, normal  . Cholecystectomy  02/10/09  . Total abdominal hysterectomy  07/18/01    benign reason, BSO, vag repair secondary prolapse  . Replacement total knee  09/2009    Right  . Dexa  08/2011    T femur -1.6, T spine -0.6  .  Colonoscopy with propofol N/A 04/30/2014    Procedure: COLONOSCOPY WITH PROPOFOL;  Surgeon: Irene Shipper, MD;  Location: [facility];  Service: Endoscopy;  Laterality: N/A;    Family History  Problem Relation Age of Onset  . Pancreatitis Mother   . Heart disease Father   . Hypertension Father   . Emphysema Father   . Arthritis Brother   . Hypertension Brother   . Cancer Maternal Aunt     ovarian cancer    Allergies  Allergen Reactions  . Keflex [Cephalexin] Shortness Of Breath  . Penicillins     REACTION: hives    Current Outpatient Prescriptions on File Prior to Visit  Medication Sig Dispense Refill  . albuterol (PROAIR HFA) 108 (90 BASE) MCG/ACT inhaler Inhale 2 puffs into the lungs every 4 (four) hours as needed. 1 Inhaler 5  . albuterol (PROVENTIL) (2.5 MG/3ML) 0.083% nebulizer solution Take 3 mLs (2.5 mg total) by nebulization 4 (four) times daily. 75 mL 12  .  budesonide (PULMICORT) 0.25 MG/2ML nebulizer solution Take 4 mLs (0.5 mg total) by nebulization 2 (two) times daily. 60 mL 12  . buPROPion (WELLBUTRIN SR) 150 MG 12 hr tablet TAKE ONE TABLET BY MOUTH TWICE DAILY 60 tablet 3  . CRANBERRY PO Take 2 tablets by mouth every morning.     . gabapentin (NEURONTIN) 100 MG capsule Take 100 mg by mouth at bedtime as needed.     Marland Kitchen guaifenesin (HUMIBID E) 400 MG TABS Take 400 mg by mouth every morning.     . loratadine (CLARITIN) 10 MG tablet Take 10 mg by mouth every morning.     Marland Kitchen losartan (COZAAR) 50 MG tablet TAKE ONE TABLET BY MOUTH EVERY DAY 30 tablet 3  . metoprolol tartrate (LOPRESSOR) 25 MG tablet TAKE ONE TABLET BY MOUTH TWICE DAILY 60 tablet 11  . MYRBETRIQ 25 MG TB24 tablet TAKE ONE TABLET BY MOUTH EVERY DAY 30 tablet 3  . NON FORMULARY Oxygen 4 liters daily and 3 liters at night    . omeprazole (PRILOSEC) 40 MG capsule TAKE ONE CAPSULE BY MOUTH EVERY DAY 30 capsule 3  . oxybutynin (DITROPAN) 5 MG tablet TAKE ONE TABLET BY MOUTH TWICE DAILY 60 tablet 3  . pravastatin (PRAVACHOL) 80 MG tablet ONE TABLET BY MOUTH AT BEDTIME 30 tablet 11  . Probiotic Product (PROBIOTIC DAILY PO) Take 1 capsule by mouth daily.    . psyllium (METAMUCIL) 58.6 % powder Take 1 packet by mouth at bedtime.     Marland Kitchen Respiratory Therapy Supplies (FLUTTER) DEVI Use as directed 1 each 0  . sertraline (ZOLOFT) 100 MG tablet TAKE ONE AND ONE-HALF TABLETS AT BEDTIME (Patient taking differently: one tablet qhs) 45 tablet 6  . SPIRIVA HANDIHALER 18 MCG inhalation capsule INHALE THE CONTENTS OF 1 CAPSULE ONCE DAILY *NOT FOR ORAL USE* 30 capsule 1  . tiZANidine (ZANAFLEX) 4 MG tablet Take 4 mg by mouth as needed.     . vitamin B-12 (CYANOCOBALAMIN) 1000 MCG tablet Take 1,000 mcg by mouth daily.     No current facility-administered medications on file prior to visit.    BP 126/76 mmHg  Pulse 65  Temp(Src) 97.5 F (36.4 C) (Oral)  Ht '5\' 3"'$  (1.6 m)  Wt 160 lb (72.576 kg)  BMI  28.35 kg/m2  SpO2 98%    Objective:   Physical Exam  Constitutional: She appears well-nourished. She does not appear ill.  Cardiovascular: Normal rate and regular rhythm.   Pulmonary/Chest: Effort normal and  breath sounds normal.  Abdominal: Soft. Bowel sounds are normal. There is no CVA tenderness.  Skin: Skin is warm and dry.          Assessment & Plan:  Urinary tract infection:  Frequency and dysuria x 1 week. History of recurrent UTI's, currently out of her cranberry tabs that she uses for prophylaxis.  UA: Positive for leuks, nitrites, blood. RX for Bactrim DS BID x 6 days and pyridium PRN. Culture sent. Follow up PRN.

## 2015-01-06 NOTE — Progress Notes (Signed)
Pre visit review using our clinic review tool, if applicable. No additional management support is needed unless otherwise documented below in the visit note. 

## 2015-01-06 NOTE — Patient Instructions (Signed)
Start Bactrim DS antibiotic for urinary tract infection. Take 1 tablet by mouth twice daily for 6 days.  You may also take the pyridium three times daily as needed for spasms/pain.  Increase your intake of water to help hydrate and flush out bacteria.   Follow up if no improvement after completion of antibiotics.  It was a pleasure meeting you!  Urinary Tract Infection Urinary tract infections (UTIs) can develop anywhere along your urinary tract. Your urinary tract is your body's drainage system for removing wastes and extra water. Your urinary tract includes two kidneys, two ureters, a bladder, and a urethra. Your kidneys are a pair of bean-shaped organs. Each kidney is about the size of your fist. They are located below your ribs, one on each side of your spine. CAUSES Infections are caused by microbes, which are microscopic organisms, including fungi, viruses, and bacteria. These organisms are so small that they can only be seen through a microscope. Bacteria are the microbes that most commonly cause UTIs. SYMPTOMS  Symptoms of UTIs may vary by age and gender of the patient and by the location of the infection. Symptoms in young women typically include a frequent and intense urge to urinate and a painful, burning feeling in the bladder or urethra during urination. Older women and men are more likely to be tired, shaky, and weak and have muscle aches and abdominal pain. A fever may mean the infection is in your kidneys. Other symptoms of a kidney infection include pain in your back or sides below the ribs, nausea, and vomiting. DIAGNOSIS To diagnose a UTI, your caregiver will ask you about your symptoms. Your caregiver also will ask to provide a urine sample. The urine sample will be tested for bacteria and white blood cells. White blood cells are made by your body to help fight infection. TREATMENT  Typically, UTIs can be treated with medication. Because most UTIs are caused by a bacterial  infection, they usually can be treated with the use of antibiotics. The choice of antibiotic and length of treatment depend on your symptoms and the type of bacteria causing your infection. HOME CARE INSTRUCTIONS  If you were prescribed antibiotics, take them exactly as your caregiver instructs you. Finish the medication even if you feel better after you have only taken some of the medication.  Drink enough water and fluids to keep your urine clear or pale yellow.  Avoid caffeine, tea, and carbonated beverages. They tend to irritate your bladder.  Empty your bladder often. Avoid holding urine for long periods of time.  Empty your bladder before and after sexual intercourse.  After a bowel movement, women should cleanse from front to back. Use each tissue only once. SEEK MEDICAL CARE IF:   You have back pain.  You develop a fever.  Your symptoms do not begin to resolve within 3 days. SEEK IMMEDIATE MEDICAL CARE IF:   You have severe back pain or lower abdominal pain.  You develop chills.  You have nausea or vomiting.  You have continued burning or discomfort with urination. MAKE SURE YOU:   Understand these instructions.  Will watch your condition.  Will get help right away if you are not doing well or get worse. Document Released: 02/17/2005 Document Revised: 11/09/2011 Document Reviewed: 06/18/2011 [facility] Patient Information 2015 [place], [place]. This information is not intended to replace advice given to you by your health care provider. Make sure you discuss any questions you have with your health care provider.

## 2015-01-06 NOTE — Addendum Note (Signed)
Addended by: Jacqualin Combes on: 01/06/2015 11:36 AM   Modules accepted: Orders

## 2015-01-08 LAB — URINE CULTURE: Colony Count: >=100000

## 2015-01-10 ENCOUNTER — Ambulatory Visit (INDEPENDENT_AMBULATORY_CARE_PROVIDER_SITE_OTHER): Payer: Medicare Other | Admitting: Pulmonary Disease

## 2015-01-10 ENCOUNTER — Encounter: Payer: Self-pay | Admitting: Pulmonary Disease

## 2015-01-10 VITALS — BP 126/80 | HR 71 | Ht 63.0 in | Wt 162.0 lb

## 2015-01-10 DIAGNOSIS — J9611 Chronic respiratory failure with hypoxia: Secondary | ICD-10-CM | POA: Diagnosis not present

## 2015-01-10 DIAGNOSIS — J432 Centrilobular emphysema: Secondary | ICD-10-CM

## 2015-01-10 DIAGNOSIS — A310 Pulmonary mycobacterial infection: Secondary | ICD-10-CM

## 2015-01-10 MED ORDER — ARFORMOTEROL TARTRATE 15 MCG/2ML IN NEBU: 15.0000 ug | INHALATION_SOLUTION | Freq: Two times a day (BID) | RESPIRATORY_TRACT | Status: DC

## 2015-01-10 NOTE — Progress Notes (Signed)
Subjective:    [name], female    DOB: 03/27/1941, 74 y.o.   MRN: [id number]  Synopsis: [name] [name] has gold stage IV COPD and has followed with Dr. Halford Chessman in [place] since around 2007. She uses 4 L of oxygen with exertion and 3 L at rest. She participates in pulmonary rehabilitation at [facility]. Because she lives in [place] she switched her care to Dr. [place] Bells in may of 2014.  HPI  Chief Complaint  Patient presents with  . Follow-up    pt c/o sob with exertion, prod cough with green mucus. Pt brought in sputum samples and had ct chest since last ov.     She has not had any new problems since the last visit.  She has not received her portable oxygen concentrator.  She is continued to cough up green mucus.  Shortness of breath is about the same.   Past Medical History  Diagnosis Date  . Diverticular disease 10/09/09    CT abd no sigmoid mass DDD L5/S1  . Peptic ulcer   . GERD (gastroesophageal reflux disease)   . Hiatal hernia   . Hypertension   . Dyslipidemia   . Chronic cystitis     recurrent UTI (4+ in last year), on suppressive doxy (uro Cope)  . Hematuria     h/o kidney stones  . COPD, severe 01/27/07       . Spinal abscess 2000    secondary to periodontal disease  . Osteoarthritis   . Depression with anxiety   . Osteopenia 08/2011    DEXA: L femur -1.6  . Left scapholunate ligament tear 2014    found on imaging  . Colon polyp   . Lung cancer 1987    Partial RLL Lobectomy.        Review of Systems  Constitutional: Negative for fever, chills and unexpected weight change.  HENT: Positive for postnasal drip. Negative for congestion, dental problem, ear pain, nosebleeds, rhinorrhea, sinus pressure, sneezing, sore throat, trouble swallowing and voice change.   Respiratory: Positive for cough and shortness of breath. Negative for choking.   Cardiovascular: Negative for chest pain and leg swelling.        Objective:   Physical Exam Filed Vitals:   01/10/15 1522  BP: 126/80  Pulse: 71  Height: '5\' 3"'$  (1.6 m)  Weight: 162 lb (73.483 kg)  SpO2: 92%  On 3 L [place]  Gen: well appearing, no acute distress HEENT: NCAT,  EOMi, OP clear,  PULM: Clear to auscultation bilaterally CV: RRR, no mgr, no JVD AB: BS+, soft, nontender, no hsm Ext: warm, no edema, no clubbing, no cyanosis  June 2016 AFb culture negative Personally reviewed the CT chest from July showing severe emphysema, nothing else     Assessment & Plan:   GOLD 4 COPD with emphysema This has been a stable interval for her. She has severe COPD with severe symptoms, but she has not had an exacerbation since the last visit. We look for evidence of atypical mycobacterial disease but we could not find it in that she only grew out in one sputum culture and further her high resolution CT chest did not show evidence of atypical mycobacterial disease. We personally reviewed the images today and saw severe emphysema but not evidence of an atypical infection.  Plan: Add Brovana Continue Pulmicort, Spiriva, and as needed albuterol Continue to stay active  Chronic respiratory failure with hypoxia Continue 3 L of oxygen continuously, she  would like a portable oxygen concentrator to we will prescribe this.  MAI (mycobacterium avium-intracellulare) She is colonized with but not infected with atypical mycobacterial disease. Her high-resolution CT chest did not show it and she only grew out of 1 culture of 3 total total samples. Therefore she does not meet criteria for an active infection.  Plan: Should she develop worsening mucus production, weight loss, or systemic symptoms suggestive of an active infection then we will collect her mucus again.    Updated Medication List Outpatient Encounter Prescriptions as of 01/10/2015  Medication Sig  . albuterol (PROAIR HFA) 108 (90 BASE) MCG/ACT inhaler Inhale 2 puffs into the lungs every 4 (four) hours  as needed.  Marland Kitchen albuterol (PROVENTIL) (2.5 MG/3ML) 0.083% nebulizer solution Take 3 mLs (2.5 mg total) by nebulization 4 (four) times daily.  . budesonide (PULMICORT) 0.25 MG/2ML nebulizer solution Take 4 mLs (0.5 mg total) by nebulization 2 (two) times daily.  Marland Kitchen buPROPion (WELLBUTRIN SR) 150 MG 12 hr tablet TAKE ONE TABLET BY MOUTH TWICE DAILY  . CRANBERRY PO Take 2 tablets by mouth every morning.   Marland Kitchen guaifenesin (HUMIBID E) 400 MG TABS Take 400 mg by mouth every morning.   . loratadine (CLARITIN) 10 MG tablet Take 10 mg by mouth every morning.   Marland Kitchen losartan (COZAAR) 50 MG tablet TAKE ONE TABLET BY MOUTH EVERY DAY  . metoprolol tartrate (LOPRESSOR) 25 MG tablet TAKE ONE TABLET BY MOUTH TWICE DAILY  . MYRBETRIQ 25 MG TB24 tablet TAKE ONE TABLET BY MOUTH EVERY DAY  . NON FORMULARY Oxygen 4 liters daily and 3 liters at night  . omeprazole (PRILOSEC) 40 MG capsule TAKE ONE CAPSULE BY MOUTH EVERY DAY  . oxybutynin (DITROPAN) 5 MG tablet TAKE ONE TABLET BY MOUTH TWICE DAILY  . phenazopyridine (PYRIDIUM) 200 MG tablet Take 1 tablet (200 mg total) by mouth 3 (three) times daily as needed for pain.  . pravastatin (PRAVACHOL) 80 MG tablet ONE TABLET BY MOUTH AT BEDTIME  . Probiotic Product (PROBIOTIC DAILY PO) Take 1 capsule by mouth daily.  . psyllium (METAMUCIL) 58.6 % powder Take 1 packet by mouth at bedtime.   Marland Kitchen Respiratory Therapy Supplies (FLUTTER) DEVI Use as directed  . sertraline (ZOLOFT) 100 MG tablet TAKE ONE AND ONE-HALF TABLETS AT BEDTIME (Patient taking differently: one tablet qhs)  . SPIRIVA HANDIHALER 18 MCG inhalation capsule INHALE THE CONTENTS OF 1 CAPSULE ONCE DAILY *NOT FOR ORAL USE*  . sulfamethoxazole-trimethoprim (BACTRIM DS,SEPTRA DS) 800-160 MG per tablet Take 1 tablet by mouth 2 (two) times daily.  Marland Kitchen tiZANidine (ZANAFLEX) 4 MG tablet Take 4 mg by mouth as needed.   . vitamin B-12 (CYANOCOBALAMIN) 1000 MCG tablet Take 1,000 mcg by mouth daily.  Marland Kitchen arformoterol (BROVANA) 15 MCG/2ML  NEBU Take 2 mLs (15 mcg total) by nebulization 2 (two) times daily.  . [DISCONTINUED] gabapentin (NEURONTIN) 100 MG capsule Take 100 mg by mouth at bedtime as needed.    No facility-administered encounter medications on file as of 01/10/2015.

## 2015-01-10 NOTE — Assessment & Plan Note (Addendum)
This has been a stable interval for her. She has severe COPD with severe symptoms, but she has not had an exacerbation since the last visit. We look for evidence of atypical mycobacterial disease but we could not find it in that she only grew out in one sputum culture and further her high resolution CT chest did not show evidence of atypical mycobacterial disease. We personally reviewed the images today and saw severe emphysema but not evidence of an atypical infection.  Plan: Add Brovana Continue Pulmicort, Spiriva, and as needed albuterol Continue to stay active

## 2015-01-10 NOTE — Patient Instructions (Signed)
Keep using your oxygen as you are doing We will prescribe a portable oxygen concentrator Take Brovana twice a day in addition to all of your other inhaled medicines Stay active Get a flu shot in the fall Come back and see [place] in December

## 2015-01-10 NOTE — Assessment & Plan Note (Signed)
She is colonized with but not infected with atypical mycobacterial disease. Her high-resolution CT chest did not show it and she only grew out of 1 culture of 3 total total samples. Therefore she does not meet criteria for an active infection.  Plan: Should she develop worsening mucus production, weight loss, or systemic symptoms suggestive of an active infection then we will collect her mucus again.

## 2015-01-10 NOTE — Assessment & Plan Note (Signed)
Continue 3 L of oxygen continuously, she would like a portable oxygen concentrator to we will prescribe this.

## 2015-01-13 DIAGNOSIS — J432 Centrilobular emphysema: Secondary | ICD-10-CM | POA: Diagnosis not present

## 2015-01-14 DIAGNOSIS — J309 Allergic rhinitis, unspecified: Secondary | ICD-10-CM | POA: Diagnosis not present

## 2015-01-14 DIAGNOSIS — R0902 Hypoxemia: Secondary | ICD-10-CM | POA: Diagnosis not present

## 2015-01-21 ENCOUNTER — Other Ambulatory Visit: Payer: Self-pay | Admitting: Family Medicine

## 2015-01-29 ENCOUNTER — Telehealth: Payer: Self-pay | Admitting: Pulmonary Disease

## 2015-01-29 ENCOUNTER — Other Ambulatory Visit: Payer: Self-pay | Admitting: Family Medicine

## 2015-01-29 NOTE — Telephone Encounter (Signed)
Called and spoke to pt. Pt states she has not heard from [place] regarding her POC. Advised pt their office is now closed and we will call in the morning (9.8.16) and then call her back. Pt verbalized understanding.

## 2015-01-30 NOTE — Telephone Encounter (Signed)
Called spoke with [employer]. Pt was approved for POC but this is coming from warehouse and at times it can take a while to get these in. Pt is on the list to get one when they come in. Called made pt aware. She verbalized understanding and needed nothing further

## 2015-02-14 DIAGNOSIS — J309 Allergic rhinitis, unspecified: Secondary | ICD-10-CM | POA: Diagnosis not present

## 2015-02-14 DIAGNOSIS — R0902 Hypoxemia: Secondary | ICD-10-CM | POA: Diagnosis not present

## 2015-02-22 ENCOUNTER — Other Ambulatory Visit: Payer: Self-pay | Admitting: Family Medicine

## 2015-02-22 DIAGNOSIS — J432 Centrilobular emphysema: Secondary | ICD-10-CM

## 2015-02-22 DIAGNOSIS — E785 Hyperlipidemia, unspecified: Secondary | ICD-10-CM

## 2015-02-22 DIAGNOSIS — I10 Essential (primary) hypertension: Secondary | ICD-10-CM

## 2015-02-22 DIAGNOSIS — R739 Hyperglycemia, unspecified: Secondary | ICD-10-CM

## 2015-02-22 DIAGNOSIS — M8580 Other specified disorders of bone density and structure, unspecified site: Secondary | ICD-10-CM

## 2015-02-24 ENCOUNTER — Other Ambulatory Visit: Payer: Self-pay | Admitting: Family Medicine

## 2015-02-25 ENCOUNTER — Other Ambulatory Visit (INDEPENDENT_AMBULATORY_CARE_PROVIDER_SITE_OTHER): Payer: Medicare Other

## 2015-02-25 DIAGNOSIS — J432 Centrilobular emphysema: Secondary | ICD-10-CM | POA: Diagnosis not present

## 2015-02-25 DIAGNOSIS — R739 Hyperglycemia, unspecified: Secondary | ICD-10-CM | POA: Diagnosis not present

## 2015-02-25 DIAGNOSIS — M8580 Other specified disorders of bone density and structure, unspecified site: Secondary | ICD-10-CM | POA: Diagnosis not present

## 2015-02-25 DIAGNOSIS — E785 Hyperlipidemia, unspecified: Secondary | ICD-10-CM

## 2015-02-25 DIAGNOSIS — I10 Essential (primary) hypertension: Secondary | ICD-10-CM | POA: Diagnosis not present

## 2015-02-25 LAB — CBC WITH DIFFERENTIAL/PLATELET
BASOS ABS: 0 10*3/uL (ref 0.0–0.1)
Basophils Relative: 0.4 % (ref 0.0–3.0)
EOS PCT: 2.8 % (ref 0.0–5.0)
Eosinophils Absolute: 0.2 10*3/uL (ref 0.0–0.7)
HEMATOCRIT: 41.3 % (ref 36.0–46.0)
Hemoglobin: 13.7 g/dL (ref 12.0–15.0)
LYMPHS PCT: 32.3 % (ref 12.0–46.0)
Lymphs Abs: 2.1 10*3/uL (ref 0.7–4.0)
MCHC: 33.2 g/dL (ref 30.0–36.0)
MCV: 92.2 fl (ref 78.0–100.0)
MONOS PCT: 7.6 % (ref 3.0–12.0)
Monocytes Absolute: 0.5 10*3/uL (ref 0.1–1.0)
NEUTROS ABS: 3.6 10*3/uL (ref 1.4–7.7)
Neutrophils Relative %: 56.9 % (ref 43.0–77.0)
PLATELETS: 147 10*3/uL — AB (ref 150.0–400.0)
RBC: 4.48 Mil/uL (ref 3.87–5.11)
RDW: 13.3 % (ref 11.5–15.5)
WBC: 6.4 10*3/uL (ref 4.0–10.5)

## 2015-02-25 LAB — VITAMIN D 25 HYDROXY (VIT D DEFICIENCY, FRACTURES): VITD: 34.01 ng/mL (ref 30.00–100.00)

## 2015-02-25 LAB — BASIC METABOLIC PANEL
BUN: 13 mg/dL (ref 6–23)
CALCIUM: 9.4 mg/dL (ref 8.4–10.5)
CO2: 36 mEq/L — ABNORMAL HIGH (ref 19–32)
CREATININE: 0.83 mg/dL (ref 0.40–1.20)
Chloride: 100 mEq/L (ref 96–112)
GFR: 71.44 mL/min (ref >60.00)
Glucose, Bld: 103 mg/dL — ABNORMAL HIGH (ref 70–99)
POTASSIUM: 4.4 meq/L (ref 3.5–5.1)
Sodium: 141 mEq/L (ref 135–145)

## 2015-02-25 LAB — LIPID PANEL
CHOL/HDL RATIO: 3
Cholesterol: 204 mg/dL — ABNORMAL HIGH (ref 0–200)
HDL: 76.9 mg/dL (ref >39.00)
LDL CALC: 100 mg/dL — AB (ref 0–99)
NonHDL: 126.63
TRIGLYCERIDES: 132 mg/dL (ref 0.0–149.0)
VLDL: 26.4 mg/dL (ref 0.0–40.0)

## 2015-02-25 LAB — TSH: TSH: 1.43 u[IU]/mL (ref 0.35–4.50)

## 2015-03-03 ENCOUNTER — Other Ambulatory Visit: Payer: Self-pay | Admitting: Family Medicine

## 2015-03-03 DIAGNOSIS — Z1231 Encounter for screening mammogram for malignant neoplasm of breast: Secondary | ICD-10-CM

## 2015-03-04 ENCOUNTER — Encounter: Payer: Self-pay | Admitting: Family Medicine

## 2015-03-04 ENCOUNTER — Ambulatory Visit (INDEPENDENT_AMBULATORY_CARE_PROVIDER_SITE_OTHER): Payer: Medicare Other | Admitting: Family Medicine

## 2015-03-04 VITALS — BP 128/66 | HR 64 | Temp 98.3°F | Ht 64.0 in | Wt 162.5 lb

## 2015-03-04 DIAGNOSIS — N390 Urinary tract infection, site not specified: Secondary | ICD-10-CM

## 2015-03-04 DIAGNOSIS — M8580 Other specified disorders of bone density and structure, unspecified site: Secondary | ICD-10-CM

## 2015-03-04 DIAGNOSIS — Z7189 Other specified counseling: Secondary | ICD-10-CM

## 2015-03-04 DIAGNOSIS — Z0000 Encounter for general adult medical examination without abnormal findings: Secondary | ICD-10-CM | POA: Insufficient documentation

## 2015-03-04 DIAGNOSIS — J432 Centrilobular emphysema: Secondary | ICD-10-CM

## 2015-03-04 DIAGNOSIS — R5382 Chronic fatigue, unspecified: Secondary | ICD-10-CM

## 2015-03-04 DIAGNOSIS — I10 Essential (primary) hypertension: Secondary | ICD-10-CM

## 2015-03-04 DIAGNOSIS — Z23 Encounter for immunization: Secondary | ICD-10-CM | POA: Diagnosis not present

## 2015-03-04 DIAGNOSIS — I2510 Atherosclerotic heart disease of native coronary artery without angina pectoris: Secondary | ICD-10-CM

## 2015-03-04 DIAGNOSIS — E785 Hyperlipidemia, unspecified: Secondary | ICD-10-CM

## 2015-03-04 DIAGNOSIS — F331 Major depressive disorder, recurrent, moderate: Secondary | ICD-10-CM

## 2015-03-04 NOTE — Assessment & Plan Note (Signed)
Preventative protocols reviewed and updated unless pt declined. Discussed healthy diet and lifestyle.  

## 2015-03-04 NOTE — Assessment & Plan Note (Signed)
Persistent issue, will discuss change in regimen at next office visit.

## 2015-03-04 NOTE — Assessment & Plan Note (Signed)
No sxs today.

## 2015-03-04 NOTE — Assessment & Plan Note (Addendum)
Well controlled on pravastatin. Continue.

## 2015-03-04 NOTE — Progress Notes (Signed)
Pre visit review using our clinic review tool, if applicable. No additional management support is needed unless otherwise documented below in the visit note. 

## 2015-03-04 NOTE — Assessment & Plan Note (Signed)
Staying fatigued. Recent vitamin levels ok.

## 2015-03-04 NOTE — Assessment & Plan Note (Signed)

## 2015-03-04 NOTE — Assessment & Plan Note (Signed)
Chronic, recently stable. Continue current regimen. Continue O2

## 2015-03-04 NOTE — Assessment & Plan Note (Signed)
Continue vitamin D.  

## 2015-03-04 NOTE — Patient Instructions (Addendum)
Flu shot today Advanced directive packet provided today. Bring me copy when you're done.  Return in 6 months for follow up visit Nice to see you today, call us with questions.

## 2015-03-04 NOTE — Assessment & Plan Note (Signed)
Chronic, stable. Continue current regimen. 

## 2015-03-04 NOTE — Assessment & Plan Note (Signed)
By CT scan, asxs. Aim for tight BP and chol control. Recent HDL high.

## 2015-03-04 NOTE — Progress Notes (Signed)
BP 128/66 mmHg  Pulse 64  Temp(Src) 98.3 F (36.8 C) (Oral)  Ht '5\' 4"'$  (1.626 m)  Wt 162 lb 8 oz (73.71 kg)  BMI 27.88 kg/m2   CC: medicare wellness visit  Subjective:    [name], female    DOB: 07-23-1940, 74 y.o.   MRN: [id number]  HPI: [name] [name] is a 74 y.o. female presenting on 03/04/2015 for Annual Exam   Staying fatigued. Noticing increased loose stools recently. No abd pain.   Hearing screen passed.  Vision screen with eye center No falls in last year  Denies depression/anhedonia/sadness.  Preventative: COLONOSCOPY WITH PROPOFOL Date: 04/30/2014 Irene Shipper - severe diverticulosis with sigmoid stenosis, no polyps, f/u PRN Well woman - has stopped.  Mammogram - scheduled for tomorrow DEXA Date: 08/2011 T femur -1.6, T spine -0.6 Flu shot today Td 2010  Pneumonia 2010 , prevnar 2015 Shingles - 10/2012  Advanced directed - would want HCPOA to be husband, Karlton Lemon. Does not have set up - requests handout. Seat belt use discussed  Caffeine: 4 cups coffee  Remarried for the third time, 2nd husband died of heart attack. Two stepsons  Activity: Walks for exercise  Diet: good water, fruits/vegetables daily   Relevant past medical, surgical, family and social history reviewed and updated as indicated. Interim medical history since our last visit reviewed. Allergies and medications reviewed and updated. Current Outpatient Prescriptions on File Prior to Visit  Medication Sig  . albuterol (PROAIR HFA) 108 (90 BASE) MCG/ACT inhaler Inhale 2 puffs into the lungs every 4 (four) hours as needed.  Marland Kitchen albuterol (PROVENTIL) (2.5 MG/3ML) 0.083% nebulizer solution Take 3 mLs (2.5 mg total) by nebulization 4 (four) times daily.  Marland Kitchen arformoterol (BROVANA) 15 MCG/2ML NEBU Take 2 mLs (15 mcg total) by nebulization 2 (two) times daily.  . budesonide (PULMICORT) 0.25 MG/2ML nebulizer solution Take 4 mLs (0.5 mg total) by nebulization 2 (two) times daily.    Marland Kitchen buPROPion (WELLBUTRIN SR) 150 MG 12 hr tablet TAKE ONE TABLET BY MOUTH TWICE DAILY  . CRANBERRY PO Take 2 tablets by mouth every morning.   Marland Kitchen guaifenesin (HUMIBID E) 400 MG TABS Take 400 mg by mouth every morning.   . loratadine (CLARITIN) 10 MG tablet Take 10 mg by mouth every morning.   Marland Kitchen losartan (COZAAR) 50 MG tablet TAKE ONE TABLET BY MOUTH EVERY DAY  . metoprolol tartrate (LOPRESSOR) 25 MG tablet TAKE ONE TABLET BY MOUTH TWICE DAILY  . MYRBETRIQ 25 MG TB24 tablet TAKE ONE TABLET BY MOUTH EVERY DAY  . NON FORMULARY Oxygen 4 liters daily and 3 liters at night  . omeprazole (PRILOSEC) 40 MG capsule TAKE ONE CAPSULE BY MOUTH EVERY DAY  . oxybutynin (DITROPAN) 5 MG tablet TAKE ONE TABLET BY MOUTH TWICE DAILY  . phenazopyridine (PYRIDIUM) 200 MG tablet Take 1 tablet (200 mg total) by mouth 3 (three) times daily as needed for pain.  . pravastatin (PRAVACHOL) 80 MG tablet ONE TABLET BY MOUTH AT BEDTIME  . Probiotic Product (PROBIOTIC DAILY PO) Take 1 capsule by mouth daily.  . psyllium (METAMUCIL) 58.6 % powder Take 1 packet by mouth at bedtime.   Marland Kitchen Respiratory Therapy Supplies (FLUTTER) DEVI Use as directed  . SPIRIVA HANDIHALER 18 MCG inhalation capsule INHALE THE CONTENTS OF 1 CAPSULE ONCE DAILY *NOT FOR ORAL USE*  . vitamin B-12 (CYANOCOBALAMIN) 1000 MCG tablet Take 1,000 mcg by mouth daily.   No current facility-administered medications on file prior to  visit.    Review of Systems  Constitutional: Negative for fever, chills, activity change, appetite change, fatigue and unexpected weight change.  HENT: Negative for hearing loss.   Eyes: Negative for visual disturbance.  Respiratory: Negative for cough, chest tightness, shortness of breath and wheezing.   Cardiovascular: Negative for chest pain, palpitations and leg swelling.  Gastrointestinal: Negative for nausea, vomiting, abdominal pain, diarrhea, constipation, blood in stool and abdominal distention.       Occasional loose  stools   Genitourinary: Negative for hematuria and difficulty urinating.  Musculoskeletal: Negative for myalgias, arthralgias and neck pain.  Skin: Negative for rash.  Neurological: Negative for dizziness, seizures, syncope and headaches.  Hematological: Negative for adenopathy. Bruises/bleeds easily.  Psychiatric/Behavioral: Negative for dysphoric mood. The patient is not nervous/anxious.    Per HPI unless specifically indicated above     Objective:    BP 128/66 mmHg  Pulse 64  Temp(Src) 98.3 F (36.8 C) (Oral)  Ht '5\' 4"'$  (1.626 m)  Wt 162 lb 8 oz (73.71 kg)  BMI 27.88 kg/m2  Wt Readings from Last 3 Encounters:  03/04/15 162 lb 8 oz (73.71 kg)  01/10/15 162 lb (73.483 kg)  01/06/15 160 lb (72.576 kg)    Physical Exam  Constitutional: She is oriented to person, place, and time. She appears well-developed and well-nourished. No distress.  3L continuous by [place]  HENT:  Head: Normocephalic and atraumatic.  Right Ear: Hearing, tympanic membrane, external ear and ear canal normal.  Left Ear: Hearing, tympanic membrane, external ear and ear canal normal.  Nose: Nose normal.  Mouth/Throat: Uvula is midline, oropharynx is clear and moist and mucous membranes are normal. No oropharyngeal exudate, posterior oropharyngeal edema or posterior oropharyngeal erythema.  Eyes: Conjunctivae and EOM are normal. Pupils are equal, round, and reactive to light. No scleral icterus.  Neck: Normal range of motion. Neck supple. Carotid bruit is not present. No thyromegaly present.  Cardiovascular: Normal rate, regular rhythm, normal heart sounds and intact distal pulses.   No murmur heard. Pulses:      Radial pulses are 2+ on the right side, and 2+ on the left side.  Pulmonary/Chest: Effort normal and breath sounds normal. No respiratory distress. She has no wheezes. She has no rales. Right breast exhibits no inverted nipple, no mass, no nipple discharge, no skin change and no tenderness. Left breast  exhibits no inverted nipple, no mass, no nipple discharge, no skin change and no tenderness.  Abdominal: Soft. Bowel sounds are normal. She exhibits no distension and no mass. There is no tenderness. There is no rebound and no guarding.  Genitourinary:  declined  Musculoskeletal: Normal range of motion. She exhibits no edema.  Lymphadenopathy:       Head (right side): No submental, no submandibular, no tonsillar, no preauricular and no posterior auricular adenopathy present.       Head (left side): No submental, no submandibular, no tonsillar, no preauricular and no posterior auricular adenopathy present.    She has no cervical adenopathy.    She has no axillary adenopathy.       Right axillary: No lateral adenopathy present.       Left axillary: No lateral adenopathy present.      Right: No supraclavicular adenopathy present.       Left: No supraclavicular adenopathy present.  Neurological: She is alert and oriented to person, place, and time.  CN grossly intact, station and gait intact Recall 3/3 Calculation 5/5 serial 3s  Skin: Skin is  warm and dry. No rash noted.  Psychiatric: She has a normal mood and affect. Her behavior is normal. Judgment and thought content normal.  Nursing note and vitals reviewed.  Results for orders placed or performed in visit on 02/25/15  Lipid panel  Result Value Ref Range   Cholesterol 204 (H) 0 - 200 mg/dL   Triglycerides 132.0 0.0 - 149.0 mg/dL   HDL 76.90 >39.00 mg/dL   VLDL 26.4 0.0 - 40.0 mg/dL   LDL Cholesterol 100 (H) 0 - 99 mg/dL   Total CHOL/HDL Ratio 3    NonHDL 161.09   Basic metabolic panel  Result Value Ref Range   Sodium 141 135 - 145 mEq/L   Potassium 4.4 3.5 - 5.1 mEq/L   Chloride 100 96 - 112 mEq/L   CO2 36 (H) 19 - 32 mEq/L   Glucose, Bld 103 (H) 70 - 99 mg/dL   BUN 13 6 - 23 mg/dL   Creatinine, Ser 0.83 0.40 - 1.20 mg/dL   Calcium 9.4 8.4 - 10.5 mg/dL   GFR 71.44 >60.00 mL/min  TSH  Result Value Ref Range   TSH 1.43 0.35  - 4.50 uIU/mL  CBC with Differential/Platelet  Result Value Ref Range   WBC 6.4 4.0 - 10.5 K/uL   RBC 4.48 3.87 - 5.11 Mil/uL   Hemoglobin 13.7 12.0 - 15.0 g/dL   HCT 41.3 36.0 - 46.0 %   MCV 92.2 78.0 - 100.0 fl   MCHC 33.2 30.0 - 36.0 g/dL   RDW 13.3 11.5 - 15.5 %   Platelets 147.0 (L) 150.0 - 400.0 K/uL   Neutrophils Relative % 56.9 43.0 - 77.0 %   Lymphocytes Relative 32.3 12.0 - 46.0 %   Monocytes Relative 7.6 3.0 - 12.0 %   Eosinophils Relative 2.8 0.0 - 5.0 %   Basophils Relative 0.4 0.0 - 3.0 %   Neutro Abs 3.6 1.4 - 7.7 K/uL   Lymphs Abs 2.1 0.7 - 4.0 K/uL   Monocytes Absolute 0.5 0.1 - 1.0 K/uL   Eosinophils Absolute 0.2 0.0 - 0.7 K/uL   Basophils Absolute 0.0 0.0 - 0.1 K/uL  Vit D  25 hydroxy (rtn osteoporosis monitoring)  Result Value Ref Range   VITD 34.01 30.00 - 100.00 ng/mL      Assessment & Plan:   Problem List Items Addressed This Visit    Recurrent UTI    No sxs today.      Osteopenia    Continue vitamin D      Medicare annual wellness visit, subsequent - Primary    I have personally reviewed the Medicare Annual Wellness questionnaire and have noted 1. The patient's medical and social history 2. Their use of alcohol, tobacco or illicit drugs 3. Their current medications and supplements 4. The patient's functional ability including ADL's, fall risks, home safety risks and hearing or visual impairment. Cognitive function has been assessed and addressed as indicated.  5. Diet and physical activity 6. Evidence for depression or mood disorders The patients weight, height, BMI have been recorded in the chart. I have made referrals, counseling and provided education to the patient based on review of the above and I have provided the pt with a written personalized care plan for preventive services. Provider list updated.. See scanned questionairre as needed for further documentation. Reviewed preventative protocols and updated unless pt declined.        MDD (major depressive disorder), recurrent episode, moderate (HCC)    Persistent issue, will discuss change  in regimen at next office visit.      Relevant Medications   sertraline (ZOLOFT) 100 MG tablet   Health care maintenance    Preventative protocols reviewed and updated unless pt declined. Discussed healthy diet and lifestyle.       GOLD 4 COPD with emphysema    Chronic, recently stable. Continue current regimen. Continue O2      Essential hypertension    Chronic, stable. Continue current regimen.      Dyslipidemia    Well controlled on pravastatin. Continue.      Chronic fatigue    Staying fatigued. Recent vitamin levels ok.      CAD (coronary artery disease)    By CT scan, asxs. Aim for tight BP and chol control. Recent HDL high.      Advanced care planning/counseling discussion    Advanced directed - would want HCPOA to be husband, Karlton Lemon. Does not have set up - requests handout.       Other Visit Diagnoses    Need for influenza vaccination        Relevant Orders    Flu Vaccine QUAD 36+ mos PF IM (Fluarix & Fluzone Quad PF) (Completed)        Follow up plan: Return in about 6 months (around 09/02/2015), or as needed, for follow up visit.

## 2015-03-04 NOTE — Assessment & Plan Note (Signed)
Advanced directed - would want HCPOA to be husband, [name] [name]. Does not have set up - requests handout. 

## 2015-03-05 ENCOUNTER — Ambulatory Visit
Admission: RE | Admit: 2015-03-05 | Discharge: 2015-03-05 | Disposition: A | Discharge status: Home / Self Care | Payer: Medicare Other | Source: Ambulatory Visit | Attending: Family Medicine | Admitting: Family Medicine

## 2015-03-05 ENCOUNTER — Other Ambulatory Visit: Payer: Self-pay | Admitting: Family Medicine

## 2015-03-05 DIAGNOSIS — Z1231 Encounter for screening mammogram for malignant neoplasm of breast: Secondary | ICD-10-CM | POA: Insufficient documentation

## 2015-03-05 LAB — HM MAMMOGRAPHY: HM MAMMO: NORMAL

## 2015-03-06 ENCOUNTER — Encounter: Payer: Self-pay | Admitting: *Deleted

## 2015-03-12 ENCOUNTER — Other Ambulatory Visit: Payer: Self-pay | Admitting: Family Medicine

## 2015-03-16 DIAGNOSIS — J309 Allergic rhinitis, unspecified: Secondary | ICD-10-CM | POA: Diagnosis not present

## 2015-03-16 DIAGNOSIS — R0902 Hypoxemia: Secondary | ICD-10-CM | POA: Diagnosis not present

## 2015-03-21 ENCOUNTER — Encounter: Payer: Self-pay | Admitting: Family Medicine

## 2015-03-21 ENCOUNTER — Ambulatory Visit (INDEPENDENT_AMBULATORY_CARE_PROVIDER_SITE_OTHER): Payer: Medicare Other | Admitting: Family Medicine

## 2015-03-21 VITALS — BP 130/82 | HR 67 | Temp 97.7°F | Ht 64.0 in | Wt 160.4 lb

## 2015-03-21 DIAGNOSIS — N390 Urinary tract infection, site not specified: Secondary | ICD-10-CM

## 2015-03-21 DIAGNOSIS — R399 Unspecified symptoms and signs involving the genitourinary system: Secondary | ICD-10-CM | POA: Diagnosis not present

## 2015-03-21 LAB — POCT URINALYSIS DIPSTICK
Bilirubin, UA: NEGATIVE
Glucose, UA: NEGATIVE
Ketones, UA: NEGATIVE
NITRITE UA: POSITIVE
PH UA: 7
PROTEIN UA: 100
Spec Grav, UA: 1.02
UROBILINOGEN UA: 0.2

## 2015-03-21 MED ORDER — CIPROFLOXACIN HCL 500 MG PO TABS: 500.0000 mg | ORAL_TABLET | Freq: Two times a day (BID) | ORAL | Status: DC

## 2015-03-21 NOTE — Progress Notes (Signed)
Subjective:  [name], female    DOB: December 16, 1940  Age: 74 y.o. MRN: [id number]  CC: UTI  HPI:  74 year old female with a past medical history of recurrent UTI presents to the clinic today with complaints of dysuria, frequency, and bladder spasm.  Patient reports that she's been experiencing the above symptoms for approximately 1 week. She denies any associated fever but reports chills. Denies nausea and vomiting. No exacerbating or relieving factors. She does report some associated suprapubic abdominal pain.  Social Hx   Social History   Social History  . Marital Status: Married    Spouse Name: N/A  . Number of Children: 2  . Years of Education: N/A   Occupational History  . Retired     prior Glass blower/designer at [employer] History Main Topics  . Smoking status: Former Smoker -- 3.00 packs/day for 34 years    Types: Cigarettes    Quit date: 05/29/1995  . Smokeless tobacco: Never Used  . Alcohol Use: Yes     Comment: wine daily 2-3 glasses/day  . Drug Use: No  . Sexual Activity: Not Asked   Other Topics Concern  . None   Social History Narrative   Caffeine: 4 cups coffee   Remarried for the third time, 2nd husband died of heart attack. Two stepsons   Activity: Walks for exercise   Diet: good water, fruits/vegetables daily      Advanced directed - would want HCPOA to be husband, Karlton Lemon. Does not have set up - requests handout.   Review of Systems  Constitutional: Positive for chills. Negative for fever.  Gastrointestinal: Positive for abdominal pain. Negative for nausea and vomiting.   Objective:  BP 130/82 mmHg  Pulse 67  Temp(Src) 97.7 F (36.5 C) (Oral)  Ht '5\' 4"'$  (1.626 m)  Wt 160 lb 6 oz (72.746 kg)  BMI 27.51 kg/m2  SpO2 96%  BP/Weight 03/21/2015 03/04/2015 2/35/3614  Systolic BP 431 540 086  Diastolic BP 82 66 80  Wt. (Lbs) 160.38 162.5 162  BMI 27.51 27.88 28.7   Physical Exam  Constitutional:  Chronically  ill-appearing female in no acute distress. Nasal cannula oxygen in place.  Cardiovascular: Normal rate and regular rhythm.   Pulmonary/Chest: Effort normal.  Breath sounds diminished throughout.  Abdominal: Soft.  Mild suprapubic tenderness. No rebound or guarding.  Neurological: She is alert.  Psychiatric: She has a normal mood and affect.  Vitals reviewed.  Results for orders placed or performed in visit on 03/21/15 (from the past 24 hour(s))  POCT urinalysis dipstick     Status: Abnormal   Collection Time: 03/21/15  2:28 PM  Result Value Ref Range   Color, UA yellow    Clarity, UA clear    Glucose, UA neg    Bilirubin, UA neg    Ketones, UA neg    Spec Grav, UA 1.020    Blood, UA large    pH, UA 7.0    Protein, UA 100    Urobilinogen, UA 0.2    Nitrite, UA pos    Leukocytes, UA large (3+) (A) Negative     Assessment & Plan:   Problem List Items Addressed This Visit    Recurrent UTI - Primary    Urinalysis revealed 3+ leukocytes, positive nitrites, and large RBCs. Given urinalysis findings and symptoms patient likely has UTI. Treating with Cipro and sending culture.      Relevant Medications   ciprofloxacin (CIPRO) 500 MG  tablet   Other Relevant Orders   POCT urinalysis dipstick (Completed)   Urine culture      Meds ordered this encounter  Medications  . ciprofloxacin (CIPRO) 500 MG tablet    Sig: Take 1 tablet (500 mg total) by mouth 2 (two) times daily.    Dispense:  14 tablet    Refill:  0    Follow-up: PRN  Thersa Salt, DO

## 2015-03-21 NOTE — Patient Instructions (Signed)
Take the antibiotic as prescribed.  Follow up closely with Dr. Danise Mina.  Take care  Dr. Lacinda Axon

## 2015-03-21 NOTE — Assessment & Plan Note (Signed)
Urinalysis revealed 3+ leukocytes, positive nitrites, and large RBCs. Given urinalysis findings and symptoms patient likely has UTI. Treating with Cipro and sending culture.

## 2015-03-21 NOTE — Progress Notes (Signed)
Pre visit review using our clinic review tool, if applicable. No additional management support is needed unless otherwise documented below in the visit note. 

## 2015-03-25 LAB — URINE CULTURE

## 2015-04-09 ENCOUNTER — Telehealth: Payer: Self-pay | Admitting: Family Medicine

## 2015-04-09 NOTE — Telephone Encounter (Signed)
Pt dropped off handicap placard to be completed. Form in Dr. Bosie Clos [facility] in-box. Thank you.  CB [phone]

## 2015-04-09 NOTE — Telephone Encounter (Signed)
In your IN box for completion.  

## 2015-04-10 NOTE — Telephone Encounter (Signed)
Filled and in Kim's box. 

## 2015-04-10 NOTE — Telephone Encounter (Signed)
Spoke to pt and informed her form is available for pickup; mailed at her request

## 2015-04-16 DIAGNOSIS — J309 Allergic rhinitis, unspecified: Secondary | ICD-10-CM | POA: Diagnosis not present

## 2015-04-16 DIAGNOSIS — R0902 Hypoxemia: Secondary | ICD-10-CM | POA: Diagnosis not present

## 2015-05-13 ENCOUNTER — Other Ambulatory Visit: Payer: Self-pay | Admitting: Family Medicine

## 2015-05-16 DIAGNOSIS — R0902 Hypoxemia: Secondary | ICD-10-CM | POA: Diagnosis not present

## 2015-05-16 DIAGNOSIS — J309 Allergic rhinitis, unspecified: Secondary | ICD-10-CM | POA: Diagnosis not present

## 2015-05-20 ENCOUNTER — Encounter: Payer: Self-pay | Admitting: Family Medicine

## 2015-05-20 ENCOUNTER — Ambulatory Visit (INDEPENDENT_AMBULATORY_CARE_PROVIDER_SITE_OTHER): Payer: Medicare Other | Admitting: Family Medicine

## 2015-05-20 VITALS — BP 146/78 | HR 76 | Temp 98.1°F | Wt 160.2 lb

## 2015-05-20 DIAGNOSIS — R350 Frequency of micturition: Secondary | ICD-10-CM | POA: Diagnosis not present

## 2015-05-20 DIAGNOSIS — R0789 Other chest pain: Secondary | ICD-10-CM

## 2015-05-20 DIAGNOSIS — R3915 Urgency of urination: Secondary | ICD-10-CM

## 2015-05-20 DIAGNOSIS — J432 Centrilobular emphysema: Secondary | ICD-10-CM

## 2015-05-20 DIAGNOSIS — N390 Urinary tract infection, site not specified: Secondary | ICD-10-CM | POA: Diagnosis not present

## 2015-05-20 LAB — POCT URINALYSIS DIPSTICK
BILIRUBIN UA: NEGATIVE
Blood, UA: NEGATIVE
GLUCOSE UA: NEGATIVE
Ketones, UA: NEGATIVE
LEUKOCYTES UA: NEGATIVE
NITRITE UA: NEGATIVE
PH UA: 6
Protein, UA: NEGATIVE
Spec Grav, UA: 1.015
Urobilinogen, UA: 0.2

## 2015-05-20 MED ORDER — ASPIRIN EC 81 MG PO TBEC: 81.0000 mg | DELAYED_RELEASE_TABLET | Freq: Every day | ORAL | Status: DC

## 2015-05-20 NOTE — Progress Notes (Signed)
BP 146/78 mmHg  Pulse 76  Temp(Src) 98.1 F (36.7 C) (Oral)  Wt 160 lb 4 oz (72.689 kg)   CC: UTI sxs, chest tightness Subjective:    [name], female    DOB: August 01, 1940, 74 y.o.   MRN: [id number]  HPI: [name] [name] is a 74 y.o. female presenting on 05/20/2015 for Urinary Tract Infection   Latest treated for UTI with cipro course 03/21/2015. UCx grew >100k E coli.   1 wk after prior cipro course she started having recurrent urinary symptoms including dysuria, persistent urgency and foul smelling urine. She is staying fatigued, + chill. Noticing decreased sexual drive. Occasional dysuria and urgency. She regularly takes cranberry tablets for UTI ppx. She took 3 cranberry tablets after recent symptoms, and they have slowly resolved on their own. She also takes myrbetriq regularly but ran out 1 wk ago. Does not need refilled (Rx at pharmacy).  No fevers, abd pain, intermittent flank pain, no nausea/vomiting, hematuria. Mild cough with persistent dyspnea.   Had a fall Saturday night while bending over in bathroom after 2 glasses of wine (more than normal). Did hit L elbow. Abrasion healing on its own. No other injury.  Metamucil helping keep her bowels regular.   Oh by the way.. .. ... Endorses intermittent chest tightness at night time. No chest tightness with exertion. Has not seen cardiologist recently.  Known stage IV COPD 4L oxygen with exertion, 3L at rest. Completed pulm rehab. No regular exercise.   Relevant past medical, surgical, family and social history reviewed and updated as indicated. Interim medical history since our last visit reviewed. Allergies and medications reviewed and updated. Current Outpatient Prescriptions on File Prior to Visit  Medication Sig  . albuterol (PROAIR HFA) 108 (90 BASE) MCG/ACT inhaler Inhale 2 puffs into the lungs every 4 (four) hours as needed.  Marland Kitchen albuterol (PROVENTIL) (2.5 MG/3ML) 0.083% nebulizer solution Take 3  mLs (2.5 mg total) by nebulization 4 (four) times daily.  Marland Kitchen arformoterol (BROVANA) 15 MCG/2ML NEBU Take 2 mLs (15 mcg total) by nebulization 2 (two) times daily.  . budesonide (PULMICORT) 0.25 MG/2ML nebulizer solution Take 4 mLs (0.5 mg total) by nebulization 2 (two) times daily.  Marland Kitchen buPROPion (WELLBUTRIN SR) 150 MG 12 hr tablet TAKE ONE TABLET BY MOUTH TWICE DAILY  . CRANBERRY PO Take 2 tablets by mouth every morning.   Marland Kitchen guaifenesin (HUMIBID E) 400 MG TABS Take 400 mg by mouth every morning.   Marland Kitchen losartan (COZAAR) 50 MG tablet TAKE ONE TABLET BY MOUTH EVERY DAY  . metoprolol tartrate (LOPRESSOR) 25 MG tablet TAKE ONE TABLET BY MOUTH TWICE DAILY  . MYRBETRIQ 25 MG TB24 tablet TAKE ONE TABLET BY MOUTH EVERY DAY  . NON FORMULARY Oxygen 4 liters daily and 3 liters at night  . omeprazole (PRILOSEC) 40 MG capsule TAKE ONE CAPSULE BY MOUTH EVERY DAY  . pravastatin (PRAVACHOL) 80 MG tablet ONE TABLET BY MOUTH AT BEDTIME  . Probiotic Product (PROBIOTIC DAILY PO) Take 1 capsule by mouth daily.  . psyllium (METAMUCIL) 58.6 % powder Take 1 packet by mouth at bedtime.   Marland Kitchen Respiratory Therapy Supplies (FLUTTER) DEVI Use as directed  . sertraline (ZOLOFT) 100 MG tablet Take one tablet in the morning and one-half at bedtime  . SPIRIVA HANDIHALER 18 MCG inhalation capsule INHALE THE CONTENTS OF 1 CAPSULE ONCE DAILY *NOT FOR ORAL USE*  . vitamin B-12 (CYANOCOBALAMIN) 1000 MCG tablet Take 1,000 mcg by mouth daily.  No current facility-administered medications on file prior to visit.    Review of Systems Per HPI unless specifically indicated in ROS section     Objective:    BP 146/78 mmHg  Pulse 76  Temp(Src) 98.1 F (36.7 C) (Oral)  Wt 160 lb 4 oz (72.689 kg)  Wt Readings from Last 3 Encounters:  05/20/15 160 lb 4 oz (72.689 kg)  03/21/15 160 lb 6 oz (72.746 kg)  03/04/15 162 lb 8 oz (73.71 kg)    Physical Exam  Constitutional: She appears well-developed and well-nourished. No distress.  O2 in  place by nasal cannula, 3L  HENT:  Mouth/Throat: Oropharynx is clear and moist. No oropharyngeal exudate.  Eyes: Conjunctivae and EOM are normal. No scleral icterus.  Cardiovascular: Normal rate, regular rhythm, normal heart sounds and intact distal pulses.   No murmur heard. No murmur appreciated today  Pulmonary/Chest: Effort normal and breath sounds normal. No respiratory distress. She has no wheezes. She has no rales.  Abdominal: Soft. Normal appearance and bowel sounds are normal. She exhibits no distension and no mass. There is no hepatosplenomegaly. There is no tenderness. There is no rigidity, no rebound, no guarding, no CVA tenderness and negative Murphy's sign.  Musculoskeletal: She exhibits no edema.  Skin: Skin is warm and dry. No rash noted.  Psychiatric: She has a normal mood and affect.  Nursing note and vitals reviewed.  Results for orders placed or performed in visit on 05/20/15  POCT Urinalysis Dipstick  Result Value Ref Range   Color, UA Yellow    Clarity, UA Clear    Glucose, UA Negative    Bilirubin, UA Negative    Ketones, UA Negative    Spec Grav, UA 1.015    Blood, UA Negative    pH, UA 6.0    Protein, UA Negative    Urobilinogen, UA 0.2    Nitrite, UA Negative    Leukocytes, UA Negative Negative      Assessment & Plan:   Problem List Items Addressed This Visit    Urinary urgency - Primary    sxs have slowly resolved on their own, UA today normal.  Advised continued monitoring, advised importance of returning if recurrent UTI sxs for expedited evaluation. Discussed possible ppx abx if recurrent UTI develops.  She is currently taking myrbetriq - advised refill. She is not currently taking oxybutynin '5mg'$  bid.      Recurrent UTI    UA normal today.      GOLD 4 COPD with emphysema    Stable period.       Chest tightness    Anticipate COPD related. Not exertional chest pain. Check EKG today.  EKG - NSR rate 60, normal axis, intervals, nonspecific T  changes with poor R wave progression anteriorly, new since prior EKG 2014. ?p mitrale. Given EKG changes, will recommend referral to cards for evaluation, rec start aspirin '81mg'$  daily. Continue statin - latest LDL 100. Reviewing CT scan 10/2014 there was mention of 3v coronary calcification       Relevant Orders   EKG 12-Lead (Completed)   Ambulatory referral to Cardiology    Other Visit Diagnoses    Urine frequency        Relevant Orders    POCT Urinalysis Dipstick (Completed)        Follow up plan: Return if symptoms worsen or fail to improve.

## 2015-05-20 NOTE — Patient Instructions (Signed)
EKG today. Restart myrbetriq and monitor for recurrent UTI symptoms - if that happens return for recheck.  Keep staying well hydrated, continue cranberry tablets. Nice to see you today, call us with quesitons.

## 2015-05-20 NOTE — Assessment & Plan Note (Signed)
Stable period.  

## 2015-05-20 NOTE — Assessment & Plan Note (Signed)
UA normal today.

## 2015-05-20 NOTE — Assessment & Plan Note (Addendum)
Anticipate COPD related. Not exertional chest pain. Check EKG today.  EKG - NSR rate 60, normal axis, intervals, nonspecific T changes with poor R wave progression anteriorly, new since prior EKG 2014. ?p mitrale. Given EKG changes, will recommend referral to cards for evaluation, rec start aspirin '81mg'$  daily. Continue statin - latest LDL 100. Reviewing CT scan 10/2014 there was mention of 3v coronary calcification

## 2015-05-20 NOTE — Assessment & Plan Note (Signed)
sxs have slowly resolved on their own, UA today normal.  Advised continued monitoring, advised importance of returning if recurrent UTI sxs for expedited evaluation. Discussed possible ppx abx if recurrent UTI develops.  She is currently taking myrbetriq - advised refill. She is not currently taking oxybutynin '5mg'$  bid.

## 2015-05-20 NOTE — Progress Notes (Signed)
Pre visit review using our clinic review tool, if applicable. No additional management support is needed unless otherwise documented below in the visit note. 

## 2015-05-21 ENCOUNTER — Other Ambulatory Visit: Payer: Self-pay | Admitting: Pulmonary Disease

## 2015-06-16 DIAGNOSIS — R0902 Hypoxemia: Secondary | ICD-10-CM | POA: Diagnosis not present

## 2015-06-16 DIAGNOSIS — J309 Allergic rhinitis, unspecified: Secondary | ICD-10-CM | POA: Diagnosis not present

## 2015-06-20 ENCOUNTER — Ambulatory Visit (INDEPENDENT_AMBULATORY_CARE_PROVIDER_SITE_OTHER): Payer: Medicare Other | Admitting: Pulmonary Disease

## 2015-06-20 ENCOUNTER — Encounter: Payer: Self-pay | Admitting: Pulmonary Disease

## 2015-06-20 VITALS — BP 122/64 | HR 69 | Ht 64.0 in | Wt 163.0 lb

## 2015-06-20 DIAGNOSIS — J432 Centrilobular emphysema: Secondary | ICD-10-CM

## 2015-06-20 DIAGNOSIS — A310 Pulmonary mycobacterial infection: Secondary | ICD-10-CM | POA: Diagnosis not present

## 2015-06-20 DIAGNOSIS — J9611 Chronic respiratory failure with hypoxia: Secondary | ICD-10-CM

## 2015-06-20 NOTE — Patient Instructions (Signed)
Use 3 L of oxygen arrest, 4 with exertion Keep taking your medications as you're doing We will see you back in 4-6 months or sooner if needed

## 2015-06-20 NOTE — Assessment & Plan Note (Signed)
[name] [name] needs to use a bit more oxygen with exertion. I'm going to change her prescription to 4 L with exertion which will involve a new oxygen concentrator. She will remain at 3 L at rest.

## 2015-06-20 NOTE — Assessment & Plan Note (Signed)
She is colonized, but has no evidence of active disease. We will continue expectant monitoring.

## 2015-06-20 NOTE — Assessment & Plan Note (Signed)
This has been a stable interval for her, though she does seem to need a bit more oxygen as her O2 saturation was only 91% on 3 L at rest today. She remains tolerant of her medications and her immunizations are up-to-date.  Plan: Continue Spiriva, Pulmicort, Brovana Stay active Follow-up 4-6 months

## 2015-06-20 NOTE — Addendum Note (Signed)
Addended by: Clayborne Dana C on: 06/20/2015 04:40 PM   Modules accepted: Orders

## 2015-06-20 NOTE — Progress Notes (Signed)
Subjective:    [name], female    DOB: 23-Nov-1940, 75 y.o.   MRN: [id number]  Synopsis: [name] has gold stage IV COPD and has followed with Dr. Halford Chessman in [place] since around 2007. She uses 4 L of oxygen with exertion and 3 L at rest. She participates in pulmonary rehabilitation at [facility]. Because she lives in [place] she switched her care to Dr. Lake Bells in may of 2014.  HPI  Chief Complaint  Patient presents with  . Follow-up    pt. states breathing is baseline. occ. SOB. prod. cough green in color. occ. chest pain. denies wheezing or chest tightness.   Felicity has been OK lately.  She says that she has been tired easily and she has liited herself to some degree. She does walk every day some but mostly to the cafeteria every day for lunch.  She did ride a stationary cycle yesterday for three miles and said that it felt great.   She continues to produce green mucus from time to time, but her weight has been stable.   She continue to tolerate her nebulizers twice a day.   She continues to use spiriva.  She uses oxygen 4Lpm at home.  Past Medical History  Diagnosis Date  . Diverticular disease 10/09/09    CT abd no sigmoid mass DDD L5/S1  . Peptic ulcer   . GERD (gastroesophageal reflux disease)   . Hiatal hernia   . Hypertension   . Dyslipidemia   . Chronic cystitis     recurrent UTI (4+ in last year), on suppressive doxy (uro Cope)  . Hematuria     h/o kidney stones  . COPD, severe 01/27/07       . Spinal abscess ([place]) 2000    secondary to periodontal disease  . Osteoarthritis   . Depression with anxiety   . Osteopenia 08/2011    DEXA: L femur -1.6  . Left scapholunate ligament tear 2014    found on imaging  . Colon polyp   . Lung cancer ([place]) 1987    Partial RLL Lobectomy.        Review of Systems  Constitutional: Negative for fever, chills and unexpected weight change.  HENT: Positive for postnasal drip.  Negative for congestion, dental problem, ear pain, nosebleeds, rhinorrhea, sinus pressure, sneezing, sore throat, trouble swallowing and voice change.   Respiratory: Positive for cough and shortness of breath. Negative for choking.   Cardiovascular: Negative for chest pain and leg swelling.       Objective:   Physical Exam Filed Vitals:   06/20/15 1540  BP: 122/64  Pulse: 69  Height: '5\' 4"'$  (1.626 m)  Weight: 163 lb (73.936 kg)  SpO2: 91%  On 3 L [place]  Gen: well appearing, no acute distress HEENT: NCAT,  EOMi, OP clear,  PULM: Clear to auscultation bilaterally CV: RRR, no mgr, no JVD AB: BS+, soft, nontender, no hsm Ext: warm, no edema, no clubbing, no cyanosis      Assessment & Plan:   GOLD 4 COPD with emphysema This has been a stable interval for her, though she does seem to need a bit more oxygen as her O2 saturation was only 91% on 3 L at rest today. She remains tolerant of her medications and her immunizations are up-to-date.  Plan: Continue Spiriva, Pulmicort, Brovana Stay active Follow-up 4-6 months  Chronic respiratory failure with hypoxia [facility]) [name] needs to use a bit more oxygen with exertion.  I'm going to change her prescription to 4 L with exertion which will involve a new oxygen concentrator. She will remain at 3 L at rest.  MAI (mycobacterium avium-intracellulare) She is colonized, but has no evidence of active disease. We will continue expectant monitoring.    Updated Medication List Outpatient Encounter Prescriptions as of 06/20/2015  Medication Sig  . albuterol (PROAIR HFA) 108 (90 BASE) MCG/ACT inhaler Inhale 2 puffs into the lungs every 4 (four) hours as needed.  Marland Kitchen albuterol (PROVENTIL) (2.5 MG/3ML) 0.083% nebulizer solution Take 3 mLs (2.5 mg total) by nebulization 4 (four) times daily.  Marland Kitchen arformoterol (BROVANA) 15 MCG/2ML NEBU Take 2 mLs (15 mcg total) by nebulization 2 (two) times daily.  Marland Kitchen aspirin EC 81 MG tablet Take 1 tablet (81 mg total) by  mouth daily.  . budesonide (PULMICORT) 0.25 MG/2ML nebulizer solution Take 4 mLs (0.5 mg total) by nebulization 2 (two) times daily.  Marland Kitchen buPROPion (WELLBUTRIN SR) 150 MG 12 hr tablet TAKE ONE TABLET BY MOUTH TWICE DAILY  . CRANBERRY PO Take 2 tablets by mouth every morning.   Marland Kitchen guaifenesin (HUMIBID E) 400 MG TABS Take 400 mg by mouth every morning.   Marland Kitchen losartan (COZAAR) 50 MG tablet TAKE ONE TABLET BY MOUTH EVERY DAY  . metoprolol tartrate (LOPRESSOR) 25 MG tablet TAKE ONE TABLET BY MOUTH TWICE DAILY  . MYRBETRIQ 25 MG TB24 tablet TAKE ONE TABLET BY MOUTH EVERY DAY  . NON FORMULARY Oxygen 4 liters daily and 3 liters at night  . omeprazole (PRILOSEC) 40 MG capsule TAKE ONE CAPSULE BY MOUTH EVERY DAY  . pravastatin (PRAVACHOL) 80 MG tablet ONE TABLET BY MOUTH AT BEDTIME  . Probiotic Product (PROBIOTIC DAILY PO) Take 1 capsule by mouth daily.  . psyllium (METAMUCIL) 58.6 % powder Take 1 packet by mouth at bedtime.   Marland Kitchen Respiratory Therapy Supplies (FLUTTER) DEVI Use as directed  . sertraline (ZOLOFT) 100 MG tablet Take one tablet in the morning and one-half at bedtime  . SPIRIVA HANDIHALER 18 MCG inhalation capsule INHALE CONTENTS OF 1 CAPSULE ONCE DAILY.  . vitamin B-12 (CYANOCOBALAMIN) 1000 MCG tablet Take 1,000 mcg by mouth daily.   No facility-administered encounter medications on file as of 06/20/2015.

## 2015-06-26 ENCOUNTER — Encounter: Payer: Self-pay | Admitting: Family Medicine

## 2015-06-26 ENCOUNTER — Ambulatory Visit (INDEPENDENT_AMBULATORY_CARE_PROVIDER_SITE_OTHER): Payer: Medicare Other | Admitting: Family Medicine

## 2015-06-26 VITALS — BP 122/70 | HR 61 | Temp 98.0°F | Wt 160.5 lb

## 2015-06-26 DIAGNOSIS — S8992XA Unspecified injury of left lower leg, initial encounter: Secondary | ICD-10-CM | POA: Diagnosis not present

## 2015-06-26 DIAGNOSIS — S81802A Unspecified open wound, left lower leg, initial encounter: Secondary | ICD-10-CM | POA: Insufficient documentation

## 2015-06-26 NOTE — Assessment & Plan Note (Signed)
New- no tenderness over bone or deformity noted.  Fracture unlikely- no xray done today. She does have a hemorrhagic blister- advised not to "pop" it as it to avoid introducing bacteria to the area. Call or return to clinic prn if these symptoms worsen or fail to improve as anticipated. The patient indicates understanding of these issues and agrees with the plan.

## 2015-06-26 NOTE — Progress Notes (Signed)
Pre visit review using our clinic review tool, if applicable. No additional management support is needed unless otherwise documented below in the visit note. 

## 2015-06-26 NOTE — Progress Notes (Signed)
Subjective:   [name], female    DOB: Oct 04, 1940, 75 y.o.   MRN: [id number]  [name] [name] is a pleasant 75 y.o. year old female pt of Dr. Darnell Level who presents to clinic today with Fall  on 06/26/2015  HPI:  [place] at home last week, hit her left lower leg and now has bruising and a large blister on her lower leg. It is not causing a lot of pain- "I just feel the skin pulling."  Has not done anything for it or put any dressing on it.  Current Outpatient Prescriptions on File Prior to Visit  Medication Sig Dispense Refill  . albuterol (PROAIR HFA) 108 (90 BASE) MCG/ACT inhaler Inhale 2 puffs into the lungs every 4 (four) hours as needed. 1 Inhaler 5  . albuterol (PROVENTIL) (2.5 MG/3ML) 0.083% nebulizer solution Take 3 mLs (2.5 mg total) by nebulization 4 (four) times daily. 75 mL 12  . arformoterol (BROVANA) 15 MCG/2ML NEBU Take 2 mLs (15 mcg total) by nebulization 2 (two) times daily. 120 mL 11  . aspirin EC 81 MG tablet Take 1 tablet (81 mg total) by mouth daily.    . budesonide (PULMICORT) 0.25 MG/2ML nebulizer solution Take 4 mLs (0.5 mg total) by nebulization 2 (two) times daily. 60 mL 12  . buPROPion (WELLBUTRIN SR) 150 MG 12 hr tablet TAKE ONE TABLET BY MOUTH TWICE DAILY 60 tablet 11  . CRANBERRY PO Take 2 tablets by mouth every morning.     Marland Kitchen guaifenesin (HUMIBID E) 400 MG TABS Take 400 mg by mouth every morning.     Marland Kitchen losartan (COZAAR) 50 MG tablet TAKE ONE TABLET BY MOUTH EVERY DAY 30 tablet 11  . metoprolol tartrate (LOPRESSOR) 25 MG tablet TAKE ONE TABLET BY MOUTH TWICE DAILY 60 tablet 11  . MYRBETRIQ 25 MG TB24 tablet TAKE ONE TABLET BY MOUTH EVERY DAY 30 tablet 11  . NON FORMULARY Oxygen 4 liters daily and 3 liters at night    . omeprazole (PRILOSEC) 40 MG capsule TAKE ONE CAPSULE BY MOUTH EVERY DAY 30 capsule 11  . pravastatin (PRAVACHOL) 80 MG tablet ONE TABLET BY MOUTH AT BEDTIME 30 tablet 11  . Probiotic Product (PROBIOTIC DAILY PO) Take 1 capsule by  mouth daily.    . psyllium (METAMUCIL) 58.6 % powder Take 1 packet by mouth at bedtime.     Marland Kitchen Respiratory Therapy Supplies (FLUTTER) DEVI Use as directed 1 each 0  . sertraline (ZOLOFT) 100 MG tablet Take one tablet in the morning and one-half at bedtime 45 tablet 11  . SPIRIVA HANDIHALER 18 MCG inhalation capsule INHALE CONTENTS OF 1 CAPSULE ONCE DAILY. 30 capsule 5  . vitamin B-12 (CYANOCOBALAMIN) 1000 MCG tablet Take 1,000 mcg by mouth daily.     No current facility-administered medications on file prior to visit.    Allergies  Allergen Reactions  . Keflex [Cephalexin] Shortness Of Breath  . Penicillins     REACTION: hives    Past Medical History  Diagnosis Date  . Diverticular disease 10/09/09    CT abd no sigmoid mass DDD L5/S1  . Peptic ulcer   . GERD (gastroesophageal reflux disease)   . Hiatal hernia   . Hypertension   . Dyslipidemia   . Chronic cystitis     recurrent UTI (4+ in last year), on suppressive doxy (uro Cope)  . Hematuria     h/o kidney stones  . COPD, severe 01/27/07       .  Spinal abscess ([place]) 2000    secondary to periodontal disease  . Osteoarthritis   . Depression with anxiety   . Osteopenia 08/2011    DEXA: L femur -1.6  . Left scapholunate ligament tear 2014    found on imaging  . Colon polyp   . Lung cancer ([place]) 1987    Partial RLL Lobectomy.    Past Surgical History  Procedure Laterality Date  . Tubal ligation      bilateral  . Bladder surgery    . Mouth surgery  Chaves pneumonia sepsis due to gum surgery  . Cystoscopy  02/11/07 and 09/25/08    Dr. Jacqlyn Larsen, normal  . Cholecystectomy  02/10/09  . Total abdominal hysterectomy  07/18/01    benign reason, BSO, vag repair secondary prolapse  . Replacement total knee  09/2009    Right  . Dexa  08/2011    T femur -1.6, T spine -0.6  . Colonoscopy with propofol N/A 04/30/2014    Procedure: COLONOSCOPY WITH PROPOFOL;  Surgeon: Irene Shipper, MD;  Location: [facility];   Service: Endoscopy;  Laterality: N/A;  . Breast biopsy Left 03/99    neg    Family History  Problem Relation Age of Onset  . Pancreatitis Mother   . Heart disease Father   . Hypertension Father   . Emphysema Father   . Arthritis Brother   . Hypertension Brother   . Cancer Maternal Aunt     ovarian cancer    Social History   Social History  . Marital Status: Married    Spouse Name: N/A  . Number of Children: 2  . Years of Education: N/A   Occupational History  . Retired     prior Glass blower/designer at [employer] History Main Topics  . Smoking status: Former Smoker -- 3.00 packs/day for 34 years    Types: Cigarettes    Quit date: 05/29/1995  . Smokeless tobacco: Never Used  . Alcohol Use: Yes     Comment: wine daily 2-3 glasses/day  . Drug Use: No  . Sexual Activity: Not on file   Other Topics Concern  . Not on file   Social History Narrative   Caffeine: 4 cups coffee   Remarried for the third time, 2nd husband died of heart attack. Two stepsons   Activity: Walks for exercise   Diet: good water, fruits/vegetables daily      Advanced directed - would want HCPOA to be husband, Karlton [place]. Does not have set up - requests handout.   The PMH, PSH, Social History, Family History, Medications, and allergies have been reviewed in [facility], and have been updated if relevant.   Review of Systems  Constitutional: Negative.   Respiratory: Negative.   Cardiovascular: Negative.   Gastrointestinal: Negative.   Skin: Positive for wound. Negative for color change, pallor and rash.  Neurological: Negative.   Hematological: Negative.   Psychiatric/Behavioral: Negative.   All other systems reviewed and are negative.      Objective:    BP 122/70 mmHg  Pulse 61  Temp(Src) 98 F (36.7 C) (Oral)  Wt 160 lb 8 oz (72.802 kg)  SpO2 94%   Physical Exam  Constitutional: She is oriented to person, place, and time. She appears well-developed and well-nourished. No  distress.  O2 per [place]  HENT:  Head: Normocephalic.  Eyes: Conjunctivae are normal.  Cardiovascular: Normal rate.   Pulmonary/Chest: Effort normal.  Musculoskeletal: Normal range of  motion.  Neurological: She is alert and oriented to person, place, and time. No cranial nerve deficit.  Skin: She is not diaphoretic.  Bruising left lower leg with quarter sized hemorrhagia blister on medial aspect, no surrounding warmth or erythema  Psychiatric: She has a normal mood and affect. Her behavior is normal. Judgment and thought content normal.  Nursing note and vitals reviewed.         Assessment & Plan:   Lower leg injury, left, initial encounter No Follow-up on file.

## 2015-06-27 ENCOUNTER — Telehealth: Payer: Self-pay | Admitting: Family Medicine

## 2015-06-27 DIAGNOSIS — S81802A Unspecified open wound, left lower leg, initial encounter: Secondary | ICD-10-CM | POA: Diagnosis not present

## 2015-06-27 DIAGNOSIS — J432 Centrilobular emphysema: Secondary | ICD-10-CM | POA: Diagnosis not present

## 2015-06-27 NOTE — Telephone Encounter (Signed)
[facility] Patient Name: [name] [name] DOB: 12/30/1940 Initial Comment Caller states she was seen yesterday for injury last week for fall with leg injury. Told to call back if worse, now inflamed. Nurse Assessment Nurse: Markus Daft, RN, Sherre Poot Date/Time (Eastern Time): 06/27/2015 1:35:57 PM Confirm and document reason for call. If symptomatic, describe symptoms. You must click the next button to save text entered. ---Caller states she was seen yesterday at office for leg injury. She got her foot stuck in chair, and body fell to the ground. She was advised to call back if worse. There is large blood blister a little larger than 50 cent piece, and there is redness as if it is inflamed around it. At the most is 2 inches of redness. One of the areas extended with redness of 3 inches. Injured leg a week ago Friday. Has the patient traveled out of the country within the last 30 days? ---Not Applicable Does the patient have any new or worsening symptoms? ---Yes Will a triage be completed? ---Yes Related visit to physician within the last 2 weeks? ---Yes Does the PT have any chronic conditions? (i.e. diabetes, asthma, etc.) ---Yes List chronic conditions. ---HTN Is this a behavioral health or substance abuse call? ---No Guidelines Guideline Title Affirmed Question Affirmed Notes Skin Injury [1] Looks infected AND [2] large red area or streak (>2 inches or 5 cm) Final Disposition User See Physician within 4 Hours (or PCP triage) Markus Daft, RN, [place] Comments RN unable to make appt at [facility] or [facility] as there are no openings. RN will send not to MD to see about their response as pt just seen yesterday. Caller aware if no response in next couple of hours to go to [facility] to be seen. RN also advised that she mark the area of redness with a pen/marker for now so that she will know if the  redness is still spreading. Referrals REFERRED TO PCP OFFICE Disagree/Comply: Comply

## 2015-06-27 NOTE — Telephone Encounter (Signed)
Spoke to pt and advised per Dr Deborra Medina. Pt states that she agrees and will go to [facility] to be seen. Advised there were no available appts [facility]

## 2015-06-27 NOTE — Telephone Encounter (Signed)
Agreed that she needs to go to [facility] tonight.

## 2015-07-04 ENCOUNTER — Encounter: Payer: Self-pay | Admitting: Family Medicine

## 2015-07-04 ENCOUNTER — Ambulatory Visit (INDEPENDENT_AMBULATORY_CARE_PROVIDER_SITE_OTHER): Payer: Medicare Other | Admitting: Family Medicine

## 2015-07-04 VITALS — BP 138/80 | HR 64 | Temp 98.4°F | Wt 161.0 lb

## 2015-07-04 DIAGNOSIS — S81802D Unspecified open wound, left lower leg, subsequent encounter: Secondary | ICD-10-CM

## 2015-07-04 NOTE — Progress Notes (Signed)
BP 138/80 mmHg  Pulse 64  Temp(Src) 98.4 F (36.9 C) (Tympanic)  Wt 161 lb (73.029 kg)  SpO2 94%   CC: check leg  Subjective:    [name], female    DOB: Aug 27, 1940, 75 y.o.   MRN: [id number]  HPI: NYJAE HODGE is a 75 y.o. female presenting on 07/04/2015 for Cellulitis   See prior note for details. Seen by Dr Deborra Medina on 06/26/2015 after fall where she hit L lower leg with large bruise/hemorrhagic blister. At that time, rec supportive care with elevation and soaking in epsom salt. bilster ended up bursting and draining.   Fell off tile seat while at [employer]. She also hit R elbow.   Was also seen at [facility] 06/27/2015 (Dr Juleen China) last week - dressed and placed on doxcycline antibiotic. Prescribed pain med but she hasn't used.   Relevant past medical, surgical, family and social history reviewed and updated as indicated. Interim medical history since our last visit reviewed. Allergies and medications reviewed and updated. Current Outpatient Prescriptions on File Prior to Visit  Medication Sig  . albuterol (PROAIR HFA) 108 (90 BASE) MCG/ACT inhaler Inhale 2 puffs into the lungs every 4 (four) hours as needed.  Marland Kitchen arformoterol (BROVANA) 15 MCG/2ML NEBU Take 2 mLs (15 mcg total) by nebulization 2 (two) times daily.  Marland Kitchen aspirin EC 81 MG tablet Take 1 tablet (81 mg total) by mouth daily.  . budesonide (PULMICORT) 0.25 MG/2ML nebulizer solution Take 4 mLs (0.5 mg total) by nebulization 2 (two) times daily.  Marland Kitchen buPROPion (WELLBUTRIN SR) 150 MG 12 hr tablet TAKE ONE TABLET BY MOUTH TWICE DAILY  . CRANBERRY PO Take 2 tablets by mouth every morning.   Marland Kitchen losartan (COZAAR) 50 MG tablet TAKE ONE TABLET BY MOUTH EVERY DAY  . metoprolol tartrate (LOPRESSOR) 25 MG tablet TAKE ONE TABLET BY MOUTH TWICE DAILY  . MYRBETRIQ 25 MG TB24 tablet TAKE ONE TABLET BY MOUTH EVERY DAY  . omeprazole (PRILOSEC) 40 MG capsule TAKE ONE CAPSULE BY MOUTH EVERY DAY  . pravastatin (PRAVACHOL)  80 MG tablet ONE TABLET BY MOUTH AT BEDTIME  . Probiotic Product (PROBIOTIC DAILY PO) Take 1 capsule by mouth daily.  . psyllium (METAMUCIL) 58.6 % powder Take 1 packet by mouth at bedtime.   . sertraline (ZOLOFT) 100 MG tablet Take one tablet in the morning and one-half at bedtime  . SPIRIVA HANDIHALER 18 MCG inhalation capsule INHALE CONTENTS OF 1 CAPSULE ONCE DAILY.  . vitamin B-12 (CYANOCOBALAMIN) 1000 MCG tablet Take 1,000 mcg by mouth daily.   No current facility-administered medications on file prior to visit.    Review of Systems Per HPI unless specifically indicated in ROS section     Objective:    BP 138/80 mmHg  Pulse 64  Temp(Src) 98.4 F (36.9 C) (Tympanic)  Wt 161 lb (73.029 kg)  SpO2 94%  Wt Readings from Last 3 Encounters:  07/04/15 161 lb (73.029 kg)  06/26/15 160 lb 8 oz (72.802 kg)  06/20/15 163 lb (73.936 kg)    Physical Exam  Constitutional: She appears well-developed and well-nourished. No distress.  Skin: Skin is warm and dry. There is erythema.     R inner leg with 6x7cm wound at site of initial hemorrhagic blister, with mild erythema, darker skin blister at initial blister 4cm diameter  Nursing note and vitals reviewed.     Assessment & Plan:   Problem List Items Addressed This Visit    Open  wound of left lower leg    Drained on its own, but epithelial skin remains intact for the most part (except for sides of wound). Mild surrounding erythema, agree with finishing doxy course as well as continued triple abx ointment. Discussed daily dressing changes.  Measured wound dimensions. RTC Wed for close recheck. If enlarging or not improving, low threshold to refer to wound clinic. Pt and husband agree with plan.       Other Visit Diagnoses    Leg wound, left, subsequent encounter    -  Primary        Follow up plan: Return in about 5 days (around 07/09/2015), or if symptoms worsen or fail to improve.

## 2015-07-04 NOTE — Assessment & Plan Note (Signed)
Drained on its own, but epithelial skin remains intact for the most part (except for sides of wound). Mild surrounding erythema, agree with finishing doxy course as well as continued triple abx ointment. Discussed daily dressing changes.  Measured wound dimensions. RTC Wed for close recheck. If enlarging or not improving, low threshold to refer to wound clinic. Pt and husband agree with plan.

## 2015-07-04 NOTE — Patient Instructions (Signed)
Finish doxycycline. Return on Wednesday for recheck. Continue daily dressing changes. Quick shower ok but no tub soaks Return or seek care sooner if fever >101, nausea, or spreading redness.

## 2015-07-04 NOTE — Progress Notes (Signed)
Pre visit review using our clinic review tool, if applicable. No additional management support is needed unless otherwise documented below in the visit note. 

## 2015-07-08 ENCOUNTER — Encounter: Payer: Self-pay | Admitting: Cardiovascular Disease

## 2015-07-08 ENCOUNTER — Ambulatory Visit (INDEPENDENT_AMBULATORY_CARE_PROVIDER_SITE_OTHER): Payer: Medicare Other | Admitting: Cardiovascular Disease

## 2015-07-08 VITALS — BP 130/80 | HR 63 | Ht 64.0 in | Wt 160.5 lb

## 2015-07-08 DIAGNOSIS — E785 Hyperlipidemia, unspecified: Secondary | ICD-10-CM

## 2015-07-08 DIAGNOSIS — I10 Essential (primary) hypertension: Secondary | ICD-10-CM | POA: Diagnosis not present

## 2015-07-08 DIAGNOSIS — R0789 Other chest pain: Secondary | ICD-10-CM

## 2015-07-08 DIAGNOSIS — I2510 Atherosclerotic heart disease of native coronary artery without angina pectoris: Secondary | ICD-10-CM | POA: Diagnosis not present

## 2015-07-08 DIAGNOSIS — R079 Chest pain, unspecified: Secondary | ICD-10-CM | POA: Diagnosis not present

## 2015-07-08 DIAGNOSIS — J9611 Chronic respiratory failure with hypoxia: Secondary | ICD-10-CM

## 2015-07-08 MED ORDER — EZETIMIBE 10 MG PO TABS: 10.0000 mg | ORAL_TABLET | Freq: Every day | ORAL | Status: DC

## 2015-07-08 NOTE — Assessment & Plan Note (Signed)
Blood pressure is well controlled on today's visit. No changes made to the medications. 

## 2015-07-08 NOTE — Assessment & Plan Note (Signed)
He denies any recent episodes of chest discomfort, reports previous episodes were in the summer of 2016 Recommended she call us if she has any recurrent symptoms, stress testing would be performed

## 2015-07-08 NOTE — Progress Notes (Signed)
[name], female    DOB: May 13, 1941, 75 y.o.   MRN: [id number]  HPI Comments: [name] [name] is a pleasant 75 year old woman with long history of smoking for approximately 50 years who quit in 1997, severe COPD clinically and documented on CT scan, three-vessel coronary artery disease, who presents by referral from Dr. Danise Mina for evaluation of shortness of breath  And chest pain. She currently lives at [facility] with her husband  She reports that she uses chronic oxygen, 2-3 L. She has shortness of breath with walking but this has been a chronic issue. She denies any dramatic change in her breathing or exercise tolerance. No regular exercise at baseline,  Husband walks on a regular basis, she is unable to keep up with him. She uses several inhalers managed by pulmonary service, Dr. Lake Bells.  No recent COPD exacerbation per the patient.  She did have episode of chest pain in the summer 2016. Describe this as a grabbing sensation when she was in the bed, trying to go to sleep. Did not have symptoms that woke her from sleep. Denies having any symptoms on exertion. She had several episodes of this grabbing sensation, and symptoms resolved without intervention. Denies having symptoms for many months.  Occasionally has left arm tingling, presenting at rest.  EKG shows normal sinus rhythm with rate 63 bpm, no significant ST or T-wave changes     Allergies  Allergen Reactions  . Keflex [Cephalexin] Shortness Of Breath  . Penicillins     REACTION: hives    Current Outpatient Prescriptions on File Prior to Visit  Medication Sig Dispense Refill  . albuterol (PROAIR HFA) 108 (90 BASE) MCG/ACT inhaler Inhale 2 puffs into the lungs every 4 (four) hours as needed. 1 Inhaler 5  . arformoterol (BROVANA) 15 MCG/2ML NEBU Take 2 mLs (15 mcg total) by nebulization 2 (two) times daily. 120 mL 11  . budesonide (PULMICORT) 0.25 MG/2ML nebulizer solution Take 4 mLs (0.5 mg  total) by nebulization 2 (two) times daily. 60 mL 12  . buPROPion (WELLBUTRIN SR) 150 MG 12 hr tablet TAKE ONE TABLET BY MOUTH TWICE DAILY 60 tablet 11  . CRANBERRY PO Take 2 tablets by mouth every morning.     Marland Kitchen HYDROcodone-acetaminophen (NORCO/VICODIN) 5-325 MG tablet Take by mouth.    . losartan (COZAAR) 50 MG tablet TAKE ONE TABLET BY MOUTH EVERY DAY 30 tablet 11  . metoprolol tartrate (LOPRESSOR) 25 MG tablet TAKE ONE TABLET BY MOUTH TWICE DAILY 60 tablet 11  . MYRBETRIQ 25 MG TB24 tablet TAKE ONE TABLET BY MOUTH EVERY DAY 30 tablet 11  . omeprazole (PRILOSEC) 40 MG capsule TAKE ONE CAPSULE BY MOUTH EVERY DAY 30 capsule 11  . pravastatin (PRAVACHOL) 80 MG tablet ONE TABLET BY MOUTH AT BEDTIME 30 tablet 11  . Probiotic Product (PROBIOTIC DAILY PO) Take 1 capsule by mouth daily.    . psyllium (METAMUCIL) 58.6 % powder Take 1 packet by mouth at bedtime.     . sertraline (ZOLOFT) 100 MG tablet Take one tablet in the morning and one-half at bedtime 45 tablet 11  . SPIRIVA HANDIHALER 18 MCG inhalation capsule INHALE CONTENTS OF 1 CAPSULE ONCE DAILY. 30 capsule 5  . vitamin B-12 (CYANOCOBALAMIN) 1000 MCG tablet Take 1,000 mcg by mouth daily.     No current facility-administered medications on file prior to visit.    Past Medical History  Diagnosis Date  . Diverticular disease 10/09/09    CT abd no sigmoid  mass DDD L5/S1  . Peptic ulcer   . GERD (gastroesophageal reflux disease)   . Hiatal hernia   . Hypertension   . Dyslipidemia   . Chronic cystitis     recurrent UTI (4+ in last year), on suppressive doxy (uro Cope)  . Hematuria     h/o kidney stones  . COPD, severe 01/27/07       . Spinal abscess ([place]) 2000    secondary to periodontal disease  . Osteoarthritis   . Depression with anxiety   . Osteopenia 08/2011    DEXA: L femur -1.6  . Left scapholunate ligament tear 2014    found on imaging  . Colon polyp   . Lung cancer ([place]) 1987    Partial RLL Lobectomy.    Past  Surgical History  Procedure Laterality Date  . Tubal ligation      bilateral  . Bladder surgery    . Mouth surgery  [place] pneumonia sepsis due to gum surgery  . Cystoscopy  02/11/07 and 09/25/08    Dr. Jacqlyn Larsen, normal  . Cholecystectomy  02/10/09  . Total abdominal hysterectomy  07/18/01    benign reason, BSO, vag repair secondary prolapse  . Replacement total knee  09/2009    Right  . Dexa  08/2011    T femur -1.6, T spine -0.6  . Colonoscopy with propofol N/A 04/30/2014    Procedure: COLONOSCOPY WITH PROPOFOL;  Surgeon: Irene Shipper, MD;  Location: [facility];  Service: Endoscopy;  Laterality: N/A;  . Breast biopsy Left 03/99    neg    Social History  reports that she quit smoking about 20 years ago. Her smoking use included Cigarettes. She has a 102 pack-year smoking history. She has never used smokeless tobacco. She reports that she drinks alcohol. She reports that she does not use illicit drugs.  Family History family history includes Arthritis in her brother; Cancer in her maternal aunt; Emphysema in her father; Heart disease in her father; Hypertension in her brother and father; Pancreatitis in her mother.    Review of Systems  Constitutional: Negative.   Respiratory: Positive for shortness of breath.   Cardiovascular: Negative.   Gastrointestinal: Negative.   Endocrine: Negative.   Musculoskeletal: Negative.   Neurological: Negative.   Hematological: Negative.   Psychiatric/Behavioral: Negative.   All other systems reviewed and are negative.   BP 130/80 mmHg  Pulse 63  Ht '5\' 4"'$  (1.626 m)  Wt 160 lb 8 oz (72.802 kg)  BMI 27.54 kg/m2   Physical Exam  Constitutional: She is oriented to person, place, and time. She appears well-developed and well-nourished.  HENT:  Head: Normocephalic.  Nose: Nose normal.  Mouth/Throat: Oropharynx is clear and moist.  Eyes: Conjunctivae are normal. Pupils are equal, round, and reactive to light.  Neck: Normal  range of motion. Neck supple. No JVD present.  Cardiovascular: Normal rate, regular rhythm, normal heart sounds and intact distal pulses.  Exam reveals no gallop and no friction rub.   No murmur heard. Pulmonary/Chest: Effort normal. No respiratory distress. She has decreased breath sounds. She has no wheezes. She has no rales. She exhibits no tenderness.  Abdominal: Soft. Bowel sounds are normal. She exhibits no distension. There is no tenderness.  Musculoskeletal: Normal range of motion. She exhibits no edema or tenderness.  Lymphadenopathy:    She has no cervical adenopathy.  Neurological: She is alert and oriented to person, place, and time. Coordination normal.  Skin:  Skin is warm and dry. No rash noted. No erythema.  Psychiatric: She has a normal mood and affect. Her behavior is normal. Judgment and thought content normal.

## 2015-07-08 NOTE — Assessment & Plan Note (Signed)
CT scan reviewed with her in detail showing mild calcifications in all 3 vessels. Currently with no symptoms concerning for angina. She reports her breathing is moderately severe but stable. No regular exercise program, feels she is deconditioned. Discussed all of this with her husband who presents with her today. After long discussion, recommended she start a regular walking program for conditioning. She reports she has done pulmonary rehabilitation or something similar in the past and it was "very hard work" Suggested she and her husband develop a walking program that would include recumbent bike, and walking. She has access to both at her facility.  Suggested if she has worsening shortness of breath or symptoms concerning for angina such as fatigue, etc., that she call our office for stress testing.

## 2015-07-08 NOTE — Patient Instructions (Addendum)
You are doing well.  Please start zetia one a day for cholesterol Continue on the pravastatin  Call the office if you have worsening shortness of breath or chest pain We would order a stress test in the hospital  Please call us if you have new issues that need to be addressed before your next appt.  Your physician wants you to follow-up in: 6 months.  You will receive a reminder letter in the mail two months in advance. If you don't receive a letter, please call our office to schedule the follow-up appointment.

## 2015-07-08 NOTE — Assessment & Plan Note (Signed)
Ideally LDL should be 70 or last. She is nervous about changing statins as she heard Lipitor might have side effects. Recommended for now she start Zetia 10 mg daily. Potentially could change to Crestor in the future if not at goal Recommended she work on a strict diet

## 2015-07-08 NOTE — Assessment & Plan Note (Addendum)
Inhalers managed by pulmonary, Severe emphysema on CT scan. Recommended a regular walking program for conditioning Currently using 3 L rest, 4L exertion   Total encounter time more than 45 minutes  Greater than 50% was spent in counseling and coordination of care with the patient

## 2015-07-09 ENCOUNTER — Encounter: Payer: Self-pay | Admitting: Family Medicine

## 2015-07-09 ENCOUNTER — Ambulatory Visit (INDEPENDENT_AMBULATORY_CARE_PROVIDER_SITE_OTHER): Payer: Medicare Other | Admitting: Family Medicine

## 2015-07-09 VITALS — BP 122/62 | HR 64 | Temp 98.0°F | Wt 159.5 lb

## 2015-07-09 DIAGNOSIS — S81802D Unspecified open wound, left lower leg, subsequent encounter: Secondary | ICD-10-CM

## 2015-07-09 NOTE — Patient Instructions (Signed)
Wound is looking better! Return in 1 week for recheck. Continue treatment as up to now. Ok to go off doxycycline - watch for spreading redness

## 2015-07-09 NOTE — Progress Notes (Signed)
BP 122/62 mmHg  Pulse 64  Temp(Src) 98 F (36.7 C) (Oral)  Wt 159 lb 8 oz (72.349 kg)   CC: f/u would  Subjective:    [name], female    DOB: 1941/02/07, 75 y.o.   MRN: [id number]  HPI: [name] [name] is a 75 y.o. female presenting on 07/09/2015 for Follow-up   See prior note for details. L leg injury with bruise and hemorrhagic blister sustained 06/26/2015. Treated with doxy course - last dose today. Also using triple abx ointment  No fevers. No streaking redness. Overall feels doing better.  Relevant past medical, surgical, family and social history reviewed and updated as indicated. Interim medical history since our last visit reviewed. Allergies and medications reviewed and updated. Current Outpatient Prescriptions on File Prior to Visit  Medication Sig  . albuterol (PROAIR HFA) 108 (90 BASE) MCG/ACT inhaler Inhale 2 puffs into the lungs every 4 (four) hours as needed.  Marland Kitchen arformoterol (BROVANA) 15 MCG/2ML NEBU Take 2 mLs (15 mcg total) by nebulization 2 (two) times daily.  Marland Kitchen aspirin EC 81 MG tablet Take 1 tablet (81 mg total) by mouth daily.  . budesonide (PULMICORT) 0.25 MG/2ML nebulizer solution Take 4 mLs (0.5 mg total) by nebulization 2 (two) times daily.  Marland Kitchen buPROPion (WELLBUTRIN SR) 150 MG 12 hr tablet TAKE ONE TABLET BY MOUTH TWICE DAILY  . CRANBERRY PO Take 2 tablets by mouth every morning.   . ezetimibe (ZETIA) 10 MG tablet Take 1 tablet (10 mg total) by mouth daily.  Marland Kitchen losartan (COZAAR) 50 MG tablet TAKE ONE TABLET BY MOUTH EVERY DAY  . metoprolol tartrate (LOPRESSOR) 25 MG tablet TAKE ONE TABLET BY MOUTH TWICE DAILY  . MYRBETRIQ 25 MG TB24 tablet TAKE ONE TABLET BY MOUTH EVERY DAY  . omeprazole (PRILOSEC) 40 MG capsule TAKE ONE CAPSULE BY MOUTH EVERY DAY  . pravastatin (PRAVACHOL) 80 MG tablet ONE TABLET BY MOUTH AT BEDTIME  . Probiotic Product (PROBIOTIC DAILY PO) Take 1 capsule by mouth daily.  . psyllium (METAMUCIL) 58.6 % powder Take 1  packet by mouth at bedtime.   . sertraline (ZOLOFT) 100 MG tablet Take one tablet in the morning and one-half at bedtime  . SPIRIVA HANDIHALER 18 MCG inhalation capsule INHALE CONTENTS OF 1 CAPSULE ONCE DAILY.  . vitamin B-12 (CYANOCOBALAMIN) 1000 MCG tablet Take 1,000 mcg by mouth daily.   No current facility-administered medications on file prior to visit.    Review of Systems Per HPI unless specifically indicated in ROS section     Objective:    BP 122/62 mmHg  Pulse 64  Temp(Src) 98 F (36.7 C) (Oral)  Wt 159 lb 8 oz (72.349 kg)  Wt Readings from Last 3 Encounters:  07/09/15 159 lb 8 oz (72.349 kg)  07/08/15 160 lb 8 oz (72.802 kg)  07/04/15 161 lb (73.029 kg)    Physical Exam  Constitutional: She appears well-developed and well-nourished. No distress.  Skin: Skin is warm and dry. No rash noted. No erythema.     R inner leg with 5.5x5.5cm bruise at site of initial hemorrhagic blister, with no erythema, darker skin bruise/burst hemorrhagic blister at initial blister 4.5cm diameter Decreasing in size No surrounding erythema Small piece of excess skin snipped off with sterile iris scissors  Nursing note and vitals reviewed.      Assessment & Plan:   Problem List Items Addressed This Visit    Open wound of left lower leg - Primary  Minimal open skin now, overall improving and decreased in size since last week.  Continue current treatment, return 1 wk recheck. Ok to stay off abx for now.          Follow up plan: Return in about 1 week (around 07/16/2015), or if symptoms worsen or fail to improve, for follow up visit.

## 2015-07-09 NOTE — Assessment & Plan Note (Signed)
Minimal open skin now, overall improving and decreased in size since last week.  Continue current treatment, return 1 wk recheck. Ok to stay off abx for now.

## 2015-07-09 NOTE — Progress Notes (Signed)
Pre visit review using our clinic review tool, if applicable. No additional management support is needed unless otherwise documented below in the visit note. 

## 2015-07-16 ENCOUNTER — Encounter: Payer: Self-pay | Admitting: Family Medicine

## 2015-07-16 ENCOUNTER — Ambulatory Visit (INDEPENDENT_AMBULATORY_CARE_PROVIDER_SITE_OTHER): Payer: Medicare Other | Admitting: Family Medicine

## 2015-07-16 VITALS — BP 140/68 | HR 57 | Temp 97.5°F | Ht 64.0 in | Wt 158.5 lb

## 2015-07-16 DIAGNOSIS — S81802D Unspecified open wound, left lower leg, subsequent encounter: Secondary | ICD-10-CM | POA: Diagnosis not present

## 2015-07-16 NOTE — Progress Notes (Signed)
BP 140/68 mmHg  Pulse 57  Temp(Src) 97.5 F (36.4 C) (Oral)  Ht '5\' 4"'$  (1.626 m)  Wt 158 lb 8 oz (71.895 kg)  BMI 27.19 kg/m2  SpO2 94%   CC: recheck leg wound  Subjective:    [name], female    DOB: 1941/04/28, 75 y.o.   MRN: [id number]  HPI: YENNY KOSA is a 75 y.o. female presenting on 07/16/2015 for Dressing Change   See prior note for details. L leg injury with bruise and hemorrhagic blister sustained 06/26/2015. Treated with doxy course - last dose today. Also using triple abx ointment  No fevers. No streaking redness. Overall feels doing better.  Relevant past medical, surgical, family and social history reviewed and updated as indicated. Interim medical history since our last visit reviewed. Allergies and medications reviewed and updated. Current Outpatient Prescriptions on File Prior to Visit  Medication Sig  . albuterol (PROAIR HFA) 108 (90 BASE) MCG/ACT inhaler Inhale 2 puffs into the lungs every 4 (four) hours as needed.  Marland Kitchen arformoterol (BROVANA) 15 MCG/2ML NEBU Take 2 mLs (15 mcg total) by nebulization 2 (two) times daily.  Marland Kitchen aspirin EC 81 MG tablet Take 1 tablet (81 mg total) by mouth daily.  . budesonide (PULMICORT) 0.25 MG/2ML nebulizer solution Take 4 mLs (0.5 mg total) by nebulization 2 (two) times daily.  Marland Kitchen buPROPion (WELLBUTRIN SR) 150 MG 12 hr tablet TAKE ONE TABLET BY MOUTH TWICE DAILY  . CRANBERRY PO Take 2 tablets by mouth every morning.   . ezetimibe (ZETIA) 10 MG tablet Take 1 tablet (10 mg total) by mouth daily.  Marland Kitchen losartan (COZAAR) 50 MG tablet TAKE ONE TABLET BY MOUTH EVERY DAY  . metoprolol tartrate (LOPRESSOR) 25 MG tablet TAKE ONE TABLET BY MOUTH TWICE DAILY  . MYRBETRIQ 25 MG TB24 tablet TAKE ONE TABLET BY MOUTH EVERY DAY  . omeprazole (PRILOSEC) 40 MG capsule TAKE ONE CAPSULE BY MOUTH EVERY DAY  . pravastatin (PRAVACHOL) 80 MG tablet ONE TABLET BY MOUTH AT BEDTIME  . Probiotic Product (PROBIOTIC DAILY PO) Take 1 capsule  by mouth daily.  . psyllium (METAMUCIL) 58.6 % powder Take 1 packet by mouth at bedtime.   . sertraline (ZOLOFT) 100 MG tablet Take one tablet in the morning and one-half at bedtime  . SPIRIVA HANDIHALER 18 MCG inhalation capsule INHALE CONTENTS OF 1 CAPSULE ONCE DAILY.  . vitamin B-12 (CYANOCOBALAMIN) 1000 MCG tablet Take 1,000 mcg by mouth daily.   No current facility-administered medications on file prior to visit.    Review of Systems Per HPI unless specifically indicated in ROS section     Objective:    BP 140/68 mmHg  Pulse 57  Temp(Src) 97.5 F (36.4 C) (Oral)  Ht '5\' 4"'$  (1.626 m)  Wt 158 lb 8 oz (71.895 kg)  BMI 27.19 kg/m2  SpO2 94%  Wt Readings from Last 3 Encounters:  07/16/15 158 lb 8 oz (71.895 kg)  07/09/15 159 lb 8 oz (72.349 kg)  07/08/15 160 lb 8 oz (72.802 kg)    Physical Exam  Constitutional: She appears well-developed and well-nourished. No distress.  Skin: Skin is warm and dry. No rash noted. No erythema.     R inner leg with 4.5x5cm epithelial disruption at site of initial hemorrhagic blister, without erythema Thin epithelial layer covering most of wound.  Nursing note and vitals reviewed.     Assessment & Plan:   Problem List Items Addressed This Visit    Open  wound of left lower leg - Primary    Slowly decreasing in size. No evidence of infection. Continue current treatment. RTC 1 wk f/u visit.          Follow up plan: Return if symptoms worsen or fail to improve.

## 2015-07-16 NOTE — Assessment & Plan Note (Signed)
Slowly decreasing in size. No evidence of infection. Continue current treatment. RTC 1 wk f/u visit.

## 2015-07-16 NOTE — Progress Notes (Signed)
Pre visit review using our clinic review tool, if applicable. No additional management support is needed unless otherwise documented below in the visit note. 

## 2015-07-16 NOTE — Patient Instructions (Addendum)
Wash daily with soapy water then pat dry. Continue dressing changes daily.  Return in 1 week to recheck, sooner if needed.

## 2015-07-17 DIAGNOSIS — J309 Allergic rhinitis, unspecified: Secondary | ICD-10-CM | POA: Diagnosis not present

## 2015-07-17 DIAGNOSIS — R0902 Hypoxemia: Secondary | ICD-10-CM | POA: Diagnosis not present

## 2015-07-23 ENCOUNTER — Telehealth: Payer: Self-pay | Admitting: Family Medicine

## 2015-07-23 ENCOUNTER — Ambulatory Visit: Payer: Medicare Other | Admitting: Family Medicine

## 2015-07-23 DIAGNOSIS — I83023 Varicose veins of left lower extremity with ulcer of ankle: Secondary | ICD-10-CM | POA: Diagnosis not present

## 2015-07-23 DIAGNOSIS — L600 Ingrowing nail: Secondary | ICD-10-CM | POA: Diagnosis not present

## 2015-07-23 NOTE — Telephone Encounter (Signed)
Patient went to see her podiatrist,Dr.Troxler,today for her toe.  Patient said Dr.Troxler said he would take over her wound care for her leg.  Patient cancelled her appointment with Dr.Gutierrez for tomorrow.

## 2015-07-24 ENCOUNTER — Ambulatory Visit: Payer: Medicare Other | Admitting: Family Medicine

## 2015-07-24 NOTE — Telephone Encounter (Signed)
Noted. Can we get his office note for our charts?

## 2015-07-29 ENCOUNTER — Encounter: Payer: Self-pay | Admitting: Family Medicine

## 2015-07-29 ENCOUNTER — Ambulatory Visit (INDEPENDENT_AMBULATORY_CARE_PROVIDER_SITE_OTHER): Payer: Medicare Other | Admitting: Family Medicine

## 2015-07-29 VITALS — BP 118/64 | HR 74 | Temp 98.8°F | Resp 18 | Wt 158.0 lb

## 2015-07-29 DIAGNOSIS — J441 Chronic obstructive pulmonary disease with (acute) exacerbation: Secondary | ICD-10-CM | POA: Insufficient documentation

## 2015-07-29 DIAGNOSIS — J432 Centrilobular emphysema: Secondary | ICD-10-CM

## 2015-07-29 MED ORDER — PREDNISONE 20 MG PO TABS: ORAL_TABLET | ORAL | Status: DC

## 2015-07-29 MED ORDER — DOXYCYCLINE HYCLATE 100 MG PO TABS: 100.0000 mg | ORAL_TABLET | Freq: Two times a day (BID) | ORAL | Status: DC

## 2015-07-29 NOTE — Progress Notes (Signed)
BP 118/64 mmHg  Pulse 74  Temp(Src) 98.8 F (37.1 C) (Oral)  Resp 18  Wt 158 lb (71.668 kg)  SpO2 91% on 3L by [place]  CC: cough  Subjective:    [name], female    DOB: 1940-08-01, 75 y.o.   MRN: [id number]  HPI: [name] [name] is a 75 y.o. female presenting on 07/29/2015 for Cough   See prior notes for details. Podiatrist Dr Elvina Mattes (pt's cousin) has been following this and healing well.   3-4d h/o cough productive of grown sputum associated with some chest congestion and discomfort with some chills. Increased wheezing and dyspnea. + headaches.   No fevers/chills, ear or tooth pain or abdominal pain.  Husband sick as well.   Using proair inhaler 4 times daily. Complaint with spiriva, pulmicort and brovana daily.  COPD on oxygen 4L with exertion, 3L at rest.  No recent COPD exacerbation MAI colonization without active infection.  Relevant past medical, surgical, family and social history reviewed and updated as indicated. Interim medical history since our last visit reviewed. Allergies and medications reviewed and updated. Current Outpatient Prescriptions on File Prior to Visit  Medication Sig  . albuterol (PROAIR HFA) 108 (90 BASE) MCG/ACT inhaler Inhale 2 puffs into the lungs every 4 (four) hours as needed.  Marland Kitchen arformoterol (BROVANA) 15 MCG/2ML NEBU Take 2 mLs (15 mcg total) by nebulization 2 (two) times daily.  Marland Kitchen aspirin EC 81 MG tablet Take 1 tablet (81 mg total) by mouth daily.  . budesonide (PULMICORT) 0.25 MG/2ML nebulizer solution Take 4 mLs (0.5 mg total) by nebulization 2 (two) times daily.  Marland Kitchen buPROPion (WELLBUTRIN SR) 150 MG 12 hr tablet TAKE ONE TABLET BY MOUTH TWICE DAILY  . CRANBERRY PO Take 2 tablets by mouth every morning.   . ezetimibe (ZETIA) 10 MG tablet Take 1 tablet (10 mg total) by mouth daily.  Marland Kitchen losartan (COZAAR) 50 MG tablet TAKE ONE TABLET BY MOUTH EVERY DAY  . metoprolol tartrate (LOPRESSOR) 25 MG tablet TAKE ONE TABLET BY MOUTH  TWICE DAILY  . MYRBETRIQ 25 MG TB24 tablet TAKE ONE TABLET BY MOUTH EVERY DAY  . omeprazole (PRILOSEC) 40 MG capsule TAKE ONE CAPSULE BY MOUTH EVERY DAY  . pravastatin (PRAVACHOL) 80 MG tablet ONE TABLET BY MOUTH AT BEDTIME  . Probiotic Product (PROBIOTIC DAILY PO) Take 1 capsule by mouth daily.  . psyllium (METAMUCIL) 58.6 % powder Take 1 packet by mouth at bedtime.   . sertraline (ZOLOFT) 100 MG tablet Take one tablet in the morning and one-half at bedtime  . SPIRIVA HANDIHALER 18 MCG inhalation capsule INHALE CONTENTS OF 1 CAPSULE ONCE DAILY.  . vitamin B-12 (CYANOCOBALAMIN) 1000 MCG tablet Take 1,000 mcg by mouth daily.   No current facility-administered medications on file prior to visit.    Review of Systems Per HPI unless specifically indicated in ROS section     Objective:    BP 118/64 mmHg  Pulse 74  Temp(Src) 98.8 F (37.1 C) (Oral)  Resp 18  Wt 158 lb (71.668 kg)  SpO2 91%  Wt Readings from Last 3 Encounters:  07/29/15 158 lb (71.668 kg)  07/16/15 158 lb 8 oz (71.895 kg)  07/09/15 159 lb 8 oz (72.349 kg)    Physical Exam  Constitutional: She appears well-developed and well-nourished. No distress.  HENT:  Head: Normocephalic and atraumatic.  Right Ear: Hearing, tympanic membrane, external ear and ear canal normal.  Left Ear: Hearing, tympanic membrane, external ear and  ear canal normal.  Nose: No mucosal edema or rhinorrhea. Right sinus exhibits no maxillary sinus tenderness and no frontal sinus tenderness. Left sinus exhibits no maxillary sinus tenderness and no frontal sinus tenderness.  Mouth/Throat: Uvula is midline, oropharynx is clear and moist and mucous membranes are normal. No oropharyngeal exudate, posterior oropharyngeal edema, posterior oropharyngeal erythema or tonsillar abscesses.  Eyes: Conjunctivae and EOM are normal. Pupils are equal, round, and reactive to light. No scleral icterus.  Neck: Normal range of motion. Neck supple.  Cardiovascular:  Normal rate, regular rhythm, normal heart sounds and intact distal pulses.   No murmur heard. Pulmonary/Chest: Effort normal. No respiratory distress. She has decreased breath sounds. She has wheezes (faint expiratory). She has no rhonchi. She has no rales.  Prolonged expiratory phase.  Lymphadenopathy:    She has no cervical adenopathy.  Skin: Skin is warm and dry. No rash noted.  Nursing note and vitals reviewed.     Assessment & Plan:   Problem List Items Addressed This Visit    GOLD 4 COPD with emphysema - Primary   Relevant Medications   predniSONE (DELTASONE) 20 MG tablet   COPD exacerbation (HCC)    Anticipate has developed COPD exacerbation - first one in >6 months. Treat with prednisone course + doxycycline and schedule albuterol inhaler over next 2-3 days.  Update if not improved with treatment.       Relevant Medications   predniSONE (DELTASONE) 20 MG tablet       Follow up plan: Return if symptoms worsen or fail to improve.

## 2015-07-29 NOTE — Patient Instructions (Signed)
I do think you have COPD exacerbation - treat with doxycycline course.  We will also prescribe steroid course - both sent to your pharmacy. May schedule proair inhaler every 4-6 hours over next 2-3 days then as needed Let us know if not improving with treatment.

## 2015-07-29 NOTE — Progress Notes (Signed)
Pre visit review using our clinic review tool, if applicable. No additional management support is needed unless otherwise documented below in the visit note. 

## 2015-07-29 NOTE — Assessment & Plan Note (Signed)
Anticipate has developed COPD exacerbation - first one in >6 months. Treat with prednisone course + doxycycline and schedule albuterol inhaler over next 2-3 days.  Update if not improved with treatment.

## 2015-08-06 DIAGNOSIS — I83023 Varicose veins of left lower extremity with ulcer of ankle: Secondary | ICD-10-CM | POA: Diagnosis not present

## 2015-08-08 ENCOUNTER — Ambulatory Visit (INDEPENDENT_AMBULATORY_CARE_PROVIDER_SITE_OTHER): Payer: Medicare Other | Admitting: Family Medicine

## 2015-08-08 ENCOUNTER — Encounter: Payer: Self-pay | Admitting: Family Medicine

## 2015-08-08 VITALS — BP 116/58 | HR 77 | Temp 99.1°F | Ht 64.0 in | Wt 155.8 lb

## 2015-08-08 DIAGNOSIS — J441 Chronic obstructive pulmonary disease with (acute) exacerbation: Secondary | ICD-10-CM

## 2015-08-08 DIAGNOSIS — J019 Acute sinusitis, unspecified: Secondary | ICD-10-CM | POA: Insufficient documentation

## 2015-08-08 DIAGNOSIS — J0110 Acute frontal sinusitis, unspecified: Secondary | ICD-10-CM | POA: Diagnosis not present

## 2015-08-08 MED ORDER — LEVOFLOXACIN 500 MG PO TABS: 500.0000 mg | ORAL_TABLET | Freq: Every day | ORAL | Status: DC

## 2015-08-08 MED ORDER — PREDNISONE 20 MG PO TABS: ORAL_TABLET | ORAL | Status: DC

## 2015-08-08 MED ORDER — HYDROCOD POLST-CPM POLST ER 10-8 MG/5ML PO SUER: 5.0000 mL | Freq: Two times a day (BID) | ORAL | Status: DC | PRN

## 2015-08-08 NOTE — Progress Notes (Signed)
Subjective:    [name], female    DOB: [birth year], 75 y.o.   MRN: [id number]  HPI Here with exac of copd    Exhaustion - but 02 level has been ok  Pulse ox is 90% today on exertion (4L) Cough is persisting  Colored sputum- green/yellow for the most part  Some wheezing -that is improved but not gone   Doxycycline and prednisone - both finished   Today - 99.1  Thinks she may have felt feverish at home (cold and hot)   Face is hurting - pressure around her eyes Green nasal d/c as well   Patient Active Problem List   Diagnosis Date Noted  . COPD exacerbation ([place]) 07/29/2015  . Open wound of left lower leg 06/26/2015  . CAD (coronary artery disease) 03/04/2015  . Health care maintenance 03/04/2015  . MAI (mycobacterium avium-intracellulare) ([place]) 10/23/2014  . Hx of colonic polyps 04/23/2014  . Advanced care planning/counseling discussion 02/18/2014  . Allergic rhinitis 02/05/2014  . MRSA colonization 02/05/2014  . Urinary urgency 03/23/2013  . Urinary incontinence 02/21/2013  . Recurrent UTI 07/27/2012  . Chest tightness 07/11/2012  . Chronic respiratory failure with hypoxia ([place]) 04/27/2012  . Medicare annual wellness visit, subsequent 09/10/2011  . Dyslipidemia 09/01/2011  . Osteopenia 08/23/2011  . Chronic fatigue 06/19/2008  . GOLD 4 COPD with emphysema 02/01/2008  . ANXIETY 11/21/2006  . MDD (major depressive disorder), recurrent episode, moderate ([place]) 11/21/2006  . Essential hypertension 11/21/2006  . Hyperglycemia 11/21/2006   Past Medical History  Diagnosis Date  . Diverticular disease 10/09/09    CT abd no sigmoid mass DDD L5/S1  . Peptic ulcer   . GERD (gastroesophageal reflux disease)   . Hiatal hernia   . Hypertension   . Dyslipidemia   . Chronic cystitis     recurrent UTI (4+ in last year), on suppressive doxy (uro [place])  . Hematuria     h/o kidney stones  . COPD, severe 01/27/07       . Spinal abscess ([place]) 2000   secondary to periodontal disease  . Osteoarthritis   . Depression with anxiety   . Osteopenia 08/2011    DEXA: L femur -1.6  . Left scapholunate ligament tear 2014    found on imaging  . Colon polyp   . Lung cancer ([place]) 1987    Partial RLL Lobectomy.   Past Surgical History  Procedure Laterality Date  . Tubal ligation      bilateral  . Bladder surgery    . Mouth surgery  [place] pneumonia sepsis due to gum surgery  . Cystoscopy  02/11/07 and 09/25/08    Dr. Jacqlyn Larsen, normal  . Cholecystectomy  02/10/09  . Total abdominal hysterectomy  07/18/01    benign reason, BSO, vag repair secondary prolapse  . Replacement total knee  09/2009    Right  . Dexa  08/2011    T femur -1.6, T spine -0.6  . Colonoscopy with propofol N/A 04/30/2014    Procedure: COLONOSCOPY WITH PROPOFOL;  Surgeon: Irene Shipper, MD;  Location: [facility] END[facility];  Service: Endoscopy;  Laterality: N/A;  . Breast biopsy Left 03/99    neg   Social History  Substance Use Topics  . Smoking status: Former Smoker -- 3.00 packs/day for 34 years    Types: Cigarettes    Quit date: 05/29/1995  . Smokeless tobacco: Never Used  . Alcohol Use: 0.0 oz/week    0  Standard drinks or equivalent per week     Comment: wine daily 2-3 glasses/day   Family History  Problem Relation Age of Onset  . Pancreatitis Mother   . Heart disease Father   . Hypertension Father   . Emphysema Father   . Arthritis Brother   . Hypertension Brother   . Cancer Maternal Aunt     ovarian cancer   Allergies  Allergen Reactions  . Keflex [Cephalexin] Shortness Of Breath  . Penicillins     REACTION: hives   Current Outpatient Prescriptions on File Prior to Visit  Medication Sig Dispense Refill  . albuterol (PROAIR HFA) 108 (90 BASE) MCG/ACT inhaler Inhale 2 puffs into the lungs every 4 (four) hours as needed. 1 Inhaler 5  . arformoterol (BROVANA) 15 MCG/2ML NEBU Take 2 mLs (15 mcg total) by nebulization 2 (two) times daily. 120 mL 11    . aspirin EC 81 MG tablet Take 1 tablet (81 mg total) by mouth daily. 90 tablet 3  . budesonide (PULMICORT) 0.25 MG/2ML nebulizer solution Take 4 mLs (0.5 mg total) by nebulization 2 (two) times daily. 60 mL 12  . buPROPion (WELLBUTRIN SR) 150 MG 12 hr tablet TAKE ONE TABLET BY MOUTH TWICE DAILY 60 tablet 11  . CRANBERRY PO Take 2 tablets by mouth every morning.     Marland Kitchen doxycycline (VIBRA-TABS) 100 MG tablet Take 1 tablet (100 mg total) by mouth 2 (two) times daily. 20 tablet 0  . ezetimibe (ZETIA) 10 MG tablet Take 1 tablet (10 mg total) by mouth daily. 30 tablet 11  . losartan (COZAAR) 50 MG tablet TAKE ONE TABLET BY MOUTH EVERY DAY 30 tablet 11  . metoprolol tartrate (LOPRESSOR) 25 MG tablet TAKE ONE TABLET BY MOUTH TWICE DAILY 60 tablet 11  . MYRBETRIQ 25 MG TB24 tablet TAKE ONE TABLET BY MOUTH EVERY DAY 30 tablet 11  . omeprazole (PRILOSEC) 40 MG capsule TAKE ONE CAPSULE BY MOUTH EVERY DAY 30 capsule 11  . pravastatin (PRAVACHOL) 80 MG tablet ONE TABLET BY MOUTH AT BEDTIME 30 tablet 11  . predniSONE (DELTASONE) 20 MG tablet Take two tablets daily for 3 days followed by one tablet daily for 3 days 9 tablet 0  . Probiotic Product (PROBIOTIC DAILY PO) Take 1 capsule by mouth daily.    . psyllium (METAMUCIL) 58.6 % powder Take 1 packet by mouth at bedtime.     . sertraline (ZOLOFT) 100 MG tablet Take one tablet in the morning and one-half at bedtime 45 tablet 11  . SPIRIVA HANDIHALER 18 MCG inhalation capsule INHALE CONTENTS OF 1 CAPSULE ONCE DAILY. 30 capsule 5  . vitamin B-12 (CYANOCOBALAMIN) 1000 MCG tablet Take 1,000 mcg by mouth daily.     No current facility-administered medications on file prior to visit.      Review of Systems Review of Systems  Constitutional: Negative for fever, appetite change,  and unexpected weight change. pos for fatigue ENT pos for nasal cong and facial pain and pnd Eyes: Negative for pain and visual disturbance.  Respiratory: posfor cough and shortness of  breath.   Cardiovascular: Negative for cp or palpitations    Gastrointestinal: Negative for nausea, diarrhea and constipation.  Genitourinary: Negative for urgency and frequency.  Skin: Negative for pallor or rash   Neurological: Negative for weakness, light-headedness, numbness and headaches.  Hematological: Negative for adenopathy. Does not bruise/bleed easily.  Psychiatric/Behavioral: Negative for dysphoric mood. The patient is not nervous/anxious.         Objective:  Physical Exam  Constitutional: She appears well-developed and well-nourished. No distress.  Well appearing elderly female  HENT:  Head: Normocephalic and atraumatic.  Right Ear: External ear normal.  Left Ear: External ear normal.  Mouth/Throat: Oropharynx is clear and moist. No oropharyngeal exudate.  Nares are injected and congested  Bilateral maxillary sinus tenderness  Post nasal drip   Eyes: Conjunctivae and EOM are normal. Pupils are equal, round, and reactive to light. Right eye exhibits no discharge. Left eye exhibits no discharge.  Neck: Normal range of motion. Neck supple.  Cardiovascular: Normal rate and regular rhythm.   Pulmonary/Chest: Effort normal. No respiratory distress. She has wheezes. She has no rales. She exhibits no tenderness.  Diffusely distant bs Some exp wheeze at bases  Mildly prolonged exp phase     Lymphadenopathy:    She has no cervical adenopathy.  Neurological: She is alert. No cranial nerve deficit.  Skin: Skin is warm and dry. No rash noted.  Psychiatric: She has a normal mood and affect.          Assessment & Plan:   Problem List Items Addressed This Visit      Respiratory   Acute sinusitis - Primary    Perpetuating cough and exac of copd Cover with levaquin and also ped taper Drink lots of fluids  Get rest when you can  Caution with the cough medicine of sedation  Take prednisone as directed Take the levaquin for sinus infection as directed  Update if not  starting to improve in a week or if worsening         Relevant Medications   levofloxacin (LEVAQUIN) 500 MG tablet   predniSONE (DELTASONE) 20 MG tablet   chlorpheniramine-HYDROcodone (TUSSIONEX PENNKINETIC ER) 10-8 MG/5ML SUER   COPD exacerbation (HCC)    Now with sinusitis Longer prednisone taper-aware of side eff Update if not starting to improve in a week or if worsening        Relevant Medications   predniSONE (DELTASONE) 20 MG tablet   chlorpheniramine-HYDROcodone (TUSSIONEX PENNKINETIC ER) 10-8 MG/5ML SUER

## 2015-08-08 NOTE — Progress Notes (Signed)
Pre visit review using our clinic review tool, if applicable. No additional management support is needed unless otherwise documented below in the visit note. 

## 2015-08-08 NOTE — Patient Instructions (Addendum)
Drink lots of fluids  Get rest when you can  Caution with the cough medicine of sedation  Take prednisone as directed Take the levaquin for sinus infection as directed  Update if not starting to improve in a week or if worsening

## 2015-08-10 NOTE — Assessment & Plan Note (Signed)
Perpetuating cough and exac of copd Cover with levaquin and also ped taper Drink lots of fluids  Get rest when you can  Caution with the cough medicine of sedation  Take prednisone as directed Take the levaquin for sinus infection as directed  Update if not starting to improve in a week or if worsening

## 2015-08-10 NOTE — Assessment & Plan Note (Signed)
Now with sinusitis Longer prednisone taper-aware of side eff Update if not starting to improve in a week or if worsening

## 2015-08-14 DIAGNOSIS — J309 Allergic rhinitis, unspecified: Secondary | ICD-10-CM | POA: Diagnosis not present

## 2015-08-14 DIAGNOSIS — R0902 Hypoxemia: Secondary | ICD-10-CM | POA: Diagnosis not present

## 2015-09-02 ENCOUNTER — Encounter: Payer: Self-pay | Admitting: Family Medicine

## 2015-09-02 ENCOUNTER — Ambulatory Visit (INDEPENDENT_AMBULATORY_CARE_PROVIDER_SITE_OTHER): Payer: Medicare Other | Admitting: Family Medicine

## 2015-09-02 VITALS — BP 116/60 | HR 74 | Temp 98.1°F | Wt 160.5 lb

## 2015-09-02 DIAGNOSIS — R739 Hyperglycemia, unspecified: Secondary | ICD-10-CM

## 2015-09-02 DIAGNOSIS — J432 Centrilobular emphysema: Secondary | ICD-10-CM

## 2015-09-02 DIAGNOSIS — F331 Major depressive disorder, recurrent, moderate: Secondary | ICD-10-CM

## 2015-09-02 DIAGNOSIS — I10 Essential (primary) hypertension: Secondary | ICD-10-CM | POA: Diagnosis not present

## 2015-09-02 DIAGNOSIS — E785 Hyperlipidemia, unspecified: Secondary | ICD-10-CM | POA: Diagnosis not present

## 2015-09-02 DIAGNOSIS — J441 Chronic obstructive pulmonary disease with (acute) exacerbation: Secondary | ICD-10-CM

## 2015-09-02 DIAGNOSIS — S81802D Unspecified open wound, left lower leg, subsequent encounter: Secondary | ICD-10-CM

## 2015-09-02 DIAGNOSIS — I2510 Atherosclerotic heart disease of native coronary artery without angina pectoris: Secondary | ICD-10-CM

## 2015-09-02 LAB — COMPREHENSIVE METABOLIC PANEL
ALK PHOS: 45 U/L (ref 39–117)
ALT: 12 U/L (ref 0–35)
AST: 17 U/L (ref 0–37)
Albumin: 3.9 g/dL (ref 3.5–5.2)
BUN: 8 mg/dL (ref 6–23)
CALCIUM: 9.4 mg/dL (ref 8.4–10.5)
CO2: 35 meq/L — AB (ref 19–32)
CREATININE: 0.71 mg/dL (ref 0.40–1.20)
Chloride: 98 mEq/L (ref 96–112)
GFR: 85.43 mL/min (ref >60.00)
Glucose, Bld: 101 mg/dL — ABNORMAL HIGH (ref 70–99)
Potassium: 4.3 mEq/L (ref 3.5–5.1)
Sodium: 139 mEq/L (ref 135–145)
TOTAL PROTEIN: 6.7 g/dL (ref 6.0–8.3)
Total Bilirubin: 0.7 mg/dL (ref 0.2–1.2)

## 2015-09-02 LAB — LIPID PANEL
Cholesterol: 147 mg/dL (ref 0–200)
HDL: 67.9 mg/dL
LDL Cholesterol: 57 mg/dL (ref 0–99)
NonHDL: 79.56
Total CHOL/HDL Ratio: 2
Triglycerides: 113 mg/dL (ref 0.0–149.0)
VLDL: 22.6 mg/dL (ref 0.0–40.0)

## 2015-09-02 LAB — CK: Total CK: 54 U/L (ref 7–177)

## 2015-09-02 MED ORDER — HYDROCOD POLST-CPM POLST ER 10-8 MG/5ML PO SUER: 5.0000 mL | Freq: Two times a day (BID) | ORAL | Status: DC | PRN

## 2015-09-02 NOTE — Progress Notes (Signed)
Pre visit review using our clinic review tool, if applicable. No additional management support is needed unless otherwise documented below in the visit note. 

## 2015-09-02 NOTE — Addendum Note (Signed)
Addended by: Marchia Bond on: 09/02/2015 01:18 PM   Modules accepted: Orders

## 2015-09-02 NOTE — Assessment & Plan Note (Signed)
Chronic, stable. Continue current regimen. 

## 2015-09-02 NOTE — Assessment & Plan Note (Signed)
Anticipate deteriorated after recent prednisone tapers - check labs today

## 2015-09-02 NOTE — Assessment & Plan Note (Signed)
Pt endorses continued healing.

## 2015-09-02 NOTE — Assessment & Plan Note (Signed)
Overall improving. No evidence of ongoing bacterial infection. Discussed need for regular controller med use.  H/o MAI colonization.

## 2015-09-02 NOTE — Assessment & Plan Note (Signed)
Ideal LDL <70. Aspirin '81mg'$  daily causing marked ecchymoses - will decrease to MWF

## 2015-09-02 NOTE — Assessment & Plan Note (Signed)
Compliant with zetia and pravastatin. zetia may be worsening arthralgias but pt states tolerable. Check CPK today, no med changes at this time.

## 2015-09-02 NOTE — Addendum Note (Signed)
Addended by: Ria Bush on: 09/02/2015 11:59 AM   Modules accepted: Orders, Medications

## 2015-09-02 NOTE — Assessment & Plan Note (Signed)
Chronic, continue wellbutrin.

## 2015-09-02 NOTE — Assessment & Plan Note (Signed)
Has not been compliant with controller meds - discussed need for regular controller med use (brovana, pulmicort nebs and spiriva). Discussed prn albuterol rescue inhaler use as well.

## 2015-09-02 NOTE — Patient Instructions (Addendum)
Change aspirin to Monday/wednesday/friday only. labwork today. I want you to consistently take spiriva inhaler once daily and pulmicort and brovana nebulizers twice daily. May use albuterol rescue inhaler as needed for cough or wheezing or shortness of breath. Other medicines are daily controller medicines to help control COPD.  Return as needed or in 6 months for medicare wellness visit

## 2015-09-02 NOTE — Progress Notes (Signed)
BP 116/60 mmHg  Pulse 74  Temp(Src) 98.1 F (36.7 C) (Oral)  Wt 160 lb 8 oz (72.802 kg)  SpO2 94% on 3L [place]  CC: 69mof/u visit  Subjective:    [name] female    DOB: [birth year] 75y.o.   MRN: [id number] HPI: [name] DURANis a 75y.o. female presenting on 09/02/2015 for 6 mos FU   Seen here 07/29/2015 with COPD exacerbation, treated with prednisone and doxycycline course. See 08/08/2015 by Dr TGlori Bickerswith COPD exacerbation + sinusitis and treated with levaquin course and prednisone taper. Productive cough of green sputum persists but without fevers. Feels sputum stuck in throat. Stays fatigued. She is using albuterol inhaler several times a day, as well as brovana and pulmicort neb twice daily and spiriva nightly. She is actually unsure of what she is taking. Stays on 3L O2 by [place].   Open wound of L lower leg - continues healing, followed by podiatry Dr TElvina Mattes   HLD - pravastatin with addition of zetia caused arthralgias at wrists. Overall tolerable.   Increased bruising noted.  Tried to back off PPI but caused worsening GERD.   Complaint with BP regimen of losartan and metoprolol. No HA, vision changes, CP/tightness, SOB, leg swelling.   Relevant past medical, surgical, family and social history reviewed and updated as indicated. Interim medical history since our last visit reviewed. Allergies and medications reviewed and updated. Current Outpatient Prescriptions on File Prior to Visit  Medication Sig  . albuterol (PROAIR HFA) 108 (90 BASE) MCG/ACT inhaler Inhale 2 puffs into the lungs every 4 (four) hours as needed.  .Marland Kitchenarformoterol (BROVANA) 15 MCG/2ML NEBU Take 2 mLs (15 mcg total) by nebulization 2 (two) times daily.  . budesonide (PULMICORT) 0.25 MG/2ML nebulizer solution Take 4 mLs (0.5 mg total) by nebulization 2 (two) times daily.  .Marland KitchenbuPROPion (WELLBUTRIN SR) 150 MG 12 hr tablet TAKE ONE TABLET BY MOUTH TWICE DAILY  . chlorpheniramine-HYDROcodone  (TUSSIONEX PENNKINETIC ER) 10-8 MG/5ML SUER Take 5 mLs by mouth every 12 (twelve) hours as needed for cough.  . CRANBERRY PO Take 2 tablets by mouth every morning.   . ezetimibe (ZETIA) 10 MG tablet Take 1 tablet (10 mg total) by mouth daily.  .Marland Kitchenlosartan (COZAAR) 50 MG tablet TAKE ONE TABLET BY MOUTH EVERY DAY  . metoprolol tartrate (LOPRESSOR) 25 MG tablet TAKE ONE TABLET BY MOUTH TWICE DAILY  . MYRBETRIQ 25 MG TB24 tablet TAKE ONE TABLET BY MOUTH EVERY DAY  . omeprazole (PRILOSEC) 40 MG capsule TAKE ONE CAPSULE BY MOUTH EVERY DAY  . pravastatin (PRAVACHOL) 80 MG tablet ONE TABLET BY MOUTH AT BEDTIME  . psyllium (METAMUCIL) 58.6 % powder Take 1 packet by mouth at bedtime.   . sertraline (ZOLOFT) 100 MG tablet Take one tablet in the morning and one-half at bedtime  . SPIRIVA HANDIHALER 18 MCG inhalation capsule INHALE CONTENTS OF 1 CAPSULE ONCE DAILY.  . vitamin B-12 (CYANOCOBALAMIN) 1000 MCG tablet Take 1,000 mcg by mouth daily.   No current facility-administered medications on file prior to visit.    Review of Systems Per HPI unless specifically indicated in ROS section     Objective:    BP 116/60 mmHg  Pulse 74  Temp(Src) 98.1 F (36.7 C) (Oral)  Wt 160 lb 8 oz (72.802 kg)  SpO2 94%  Wt Readings from Last 3 Encounters:  09/02/15 160 lb 8 oz (72.802 kg)  08/08/15 155 lb 12 oz (70.648 kg)  07/29/15 158 lb (71.668 kg)    Physical Exam  Constitutional: She appears well-developed and well-nourished. No distress.   in place  HENT:  Mouth/Throat: Oropharynx is clear and moist. No oropharyngeal exudate.  Cardiovascular: Normal rate, regular rhythm, normal heart sounds and intact distal pulses.   No murmur heard. Pulmonary/Chest: Effort normal and breath sounds normal. No respiratory distress. She has no wheezes. She has no rales.  Musculoskeletal: She exhibits no edema.  L leg in ace bandage  Skin: Skin is warm and dry. No rash noted.  Psychiatric: She has a normal mood and  affect.  Nursing note and vitals reviewed.      Assessment & Plan:   Problem List Items Addressed This Visit    MDD (major depressive disorder), recurrent episode, moderate (HCC)    Chronic, continue wellbutrin.      Essential hypertension    Chronic, stable. Continue current regimen.      Relevant Medications   aspirin EC 81 MG tablet   GOLD 4 COPD with emphysema    Has not been compliant with controller meds - discussed need for regular controller med use (brovana, pulmicort nebs and spiriva). Discussed prn albuterol rescue inhaler use as well.       Hyperglycemia    Anticipate deteriorated after recent prednisone tapers - check labs today       Dyslipidemia - Primary    Compliant with zetia and pravastatin. zetia may be worsening arthralgias but pt states tolerable. Check CPK today, no med changes at this time.      Relevant Orders   Lipid panel   Comprehensive metabolic panel   CAD (coronary artery disease)    Ideal LDL <70. Aspirin '81mg'$  daily causing marked ecchymoses - will decrease to MWF      Relevant Medications   aspirin EC 81 MG tablet   Open wound of left lower leg    Pt endorses continued healing.      COPD exacerbation ([place])    Overall improving. No evidence of ongoing bacterial infection. Discussed need for regular controller med use.  H/o MAI colonization.           Follow up plan: Return in about 6 months (around 03/03/2016) for medicare wellness visit.  Ria Bush, MD

## 2015-09-03 ENCOUNTER — Encounter: Payer: Self-pay | Admitting: *Deleted

## 2015-09-09 DIAGNOSIS — J432 Centrilobular emphysema: Secondary | ICD-10-CM | POA: Diagnosis not present

## 2015-09-14 DIAGNOSIS — R0902 Hypoxemia: Secondary | ICD-10-CM | POA: Diagnosis not present

## 2015-09-14 DIAGNOSIS — J309 Allergic rhinitis, unspecified: Secondary | ICD-10-CM | POA: Diagnosis not present

## 2015-10-06 ENCOUNTER — Other Ambulatory Visit: Payer: Self-pay | Admitting: Pulmonary Disease

## 2015-10-14 DIAGNOSIS — J309 Allergic rhinitis, unspecified: Secondary | ICD-10-CM | POA: Diagnosis not present

## 2015-10-14 DIAGNOSIS — R0902 Hypoxemia: Secondary | ICD-10-CM | POA: Diagnosis not present

## 2015-10-21 ENCOUNTER — Encounter: Payer: Self-pay | Admitting: Pulmonary Disease

## 2015-10-21 ENCOUNTER — Ambulatory Visit (INDEPENDENT_AMBULATORY_CARE_PROVIDER_SITE_OTHER): Payer: Medicare Other | Admitting: Pulmonary Disease

## 2015-10-21 VITALS — BP 144/68 | HR 65 | Ht 64.0 in | Wt 156.0 lb

## 2015-10-21 DIAGNOSIS — J432 Centrilobular emphysema: Secondary | ICD-10-CM | POA: Diagnosis not present

## 2015-10-21 DIAGNOSIS — J9611 Chronic respiratory failure with hypoxia: Secondary | ICD-10-CM | POA: Diagnosis not present

## 2015-10-21 NOTE — Progress Notes (Signed)
Subjective:    [name], female    DOB: [birth year], 75 y.o.   MRN: [id number]  Synopsis: Talonda Artist has gold stage IV COPD and has followed with Dr. Halford Chessman in [place] since around 2007. She uses 4 L of oxygen with exertion and 3 L at rest. She participates in pulmonary rehabilitation at [facility]. Because she lives in [place] she switched her care to Dr. Lake Bells in may of 2014. She had a HRCT in 2016 that showed emphysema but no ILD.  HPI  Chief Complaint  Patient presents with  . Follow-up    pt c/o sob with exertion, sinus congestion, pnd, prod cough with green mucus X1 month.     [name] says that she feels that her breathing is getting worse.  She feels she can't walk as much as she used to.  She says that she uses the inhalers as needed.  She takes her brovana, pulmicort and Spiriva regularly.  She said she had a flare of her breathing problems in the Spring and was treated with prednisone and an antibiotic that helped.  She was coughing a lot at the time.  She was also given tussionex that gave her nightmares.     She sees Dr. Rockey Situ for her coronary artery disease and was started on aspirin.    She doesn't exercise because of dyspnea.  Past Medical History  Diagnosis Date  . Diverticular disease 10/09/09    CT abd no sigmoid mass DDD L5/S1  . Peptic ulcer   . GERD (gastroesophageal reflux disease)   . Hiatal hernia   . Hypertension   . Dyslipidemia   . Chronic cystitis     recurrent UTI (4+ in last year), on suppressive doxy (uro Cope)  . Hematuria     h/o kidney stones  . COPD, severe 01/27/07       . Spinal abscess ([place]) 2000    secondary to periodontal disease  . Osteoarthritis   . Depression with anxiety   . Osteopenia 08/2011    DEXA: L femur -1.6  . Left scapholunate ligament tear 2014    found on imaging  . Colon polyp   . Lung cancer ([place]) 1987    Partial RLL Lobectomy.        Review of Systems   Constitutional: Negative for fever, chills and unexpected weight change.  HENT: Negative for congestion, dental problem, ear pain, nosebleeds, postnasal drip, rhinorrhea, sinus pressure, sneezing, sore throat, trouble swallowing and voice change.   Respiratory: Positive for shortness of breath. Negative for cough and choking.   Cardiovascular: Negative for chest pain and leg swelling.       Objective:   Physical Exam Filed Vitals:   10/21/15 1044  BP: 144/68  Pulse: 65  Height: '5\' 4"'$  (1.626 m)  Weight: 156 lb (70.761 kg)  SpO2: 93%  On 3 L [place]  Gen: well appearing, no acute distress HEENT: NCAT,  EOMi, OP clear,  PULM: Wheezing in bases bilaterally, limited air movement CV: RRR, no mgr, no JVD AB: BS+, soft, nontender, no hsm Ext: warm, no edema, no clubbing, no cyanosis  Dr. Donivan Scull notes were reviewed where she was advised to exercise more and use aspirin.    Assessment & Plan:   GOLD 4 COPD with emphysema Her dyspnea is due primarily to the fact that she has severe emphysema and stage IV airflow obstruction. She also has coronary disease but no chest pain. Dr. Rockey Situ has seen  her and is okay with her exercising more. Deconditioning is clearly contribute significantly to her shortness of breath burden.  In general, she has very advanced disease and controlling her dyspnea will be difficult.  Plan: Continue Brovana and Pulmicort and Spiriva Pulmonary rehabilitation referral Let Dr. Rockey Situ know if you have increasing chest pain Follow-up 3 months  Chronic respiratory failure with hypoxia ([place]) Continue 3 L of oxygen at rest 4 L with exertion    Updated Medication List Outpatient Encounter Prescriptions as of 10/21/2015  Medication Sig  . albuterol (PROAIR HFA) 108 (90 BASE) MCG/ACT inhaler Inhale 2 puffs into the lungs every 4 (four) hours as needed.  Marland Kitchen arformoterol (BROVANA) 15 MCG/2ML NEBU Take 2 mLs (15 mcg total) by nebulization 2 (two) times daily.  Marland Kitchen aspirin  EC 81 MG tablet Take 1 tablet (81 mg total) by mouth every Monday, Wednesday, and Friday.  . budesonide (PULMICORT) 0.25 MG/2ML nebulizer solution Take 4 mLs (0.5 mg total) by nebulization 2 (two) times daily.  Marland Kitchen buPROPion (WELLBUTRIN SR) 150 MG 12 hr tablet TAKE ONE TABLET BY MOUTH TWICE DAILY  . CRANBERRY PO Take 2 tablets by mouth every morning.   . ezetimibe (ZETIA) 10 MG tablet Take 1 tablet (10 mg total) by mouth daily.  Marland Kitchen losartan (COZAAR) 50 MG tablet TAKE ONE TABLET BY MOUTH EVERY DAY  . metoprolol tartrate (LOPRESSOR) 25 MG tablet TAKE ONE TABLET BY MOUTH TWICE DAILY  . MYRBETRIQ 25 MG TB24 tablet TAKE ONE TABLET BY MOUTH EVERY DAY  . omeprazole (PRILOSEC) 40 MG capsule TAKE ONE CAPSULE BY MOUTH EVERY DAY  . pravastatin (PRAVACHOL) 80 MG tablet ONE TABLET BY MOUTH AT BEDTIME  . PROAIR HFA 108 (90 Base) MCG/ACT inhaler INHALE 2 PUFFS EVERY 4 HOURS AS NEEDED  . psyllium (METAMUCIL) 58.6 % powder Take 1 packet by mouth at bedtime.   . sertraline (ZOLOFT) 100 MG tablet Take one tablet in the morning and one-half at bedtime  . SPIRIVA HANDIHALER 18 MCG inhalation capsule INHALE CONTENTS OF 1 CAPSULE ONCE DAILY.  . vitamin B-12 (CYANOCOBALAMIN) 1000 MCG tablet Take 1,000 mcg by mouth daily.  . [DISCONTINUED] chlorpheniramine-HYDROcodone (TUSSIONEX PENNKINETIC ER) 10-8 MG/5ML SUER Take 5 mLs by mouth every 12 (twelve) hours as needed for cough. (Patient not taking: Reported on 10/21/2015)   No facility-administered encounter medications on file as of 10/21/2015.

## 2015-10-21 NOTE — Addendum Note (Signed)
Addended by: Len Blalock on: 10/21/2015 11:58 AM   Modules accepted: Orders

## 2015-10-21 NOTE — Assessment & Plan Note (Signed)
Continue 3 L of oxygen at rest 4 L with exertion

## 2015-10-21 NOTE — Patient Instructions (Signed)
Let Dr. Rockey Situ know if you have chest pain again We will refer you to pulmonary rehabilitation Keep taking your medications as you're doing We will see you back in 3 months or sooner if needed

## 2015-10-21 NOTE — Assessment & Plan Note (Signed)
Her dyspnea is due primarily to the fact that she has severe emphysema and stage IV airflow obstruction. She also has coronary disease but no chest pain. Dr. Rockey Situ has seen her and is okay with her exercising more. Deconditioning is clearly contribute significantly to her shortness of breath burden.  In general, she has very advanced disease and controlling her dyspnea will be difficult.  Plan: Continue Brovana and Pulmicort and Spiriva Pulmonary rehabilitation referral Let Dr. Rockey Situ know if you have increasing chest pain Follow-up 3 months

## 2015-10-22 ENCOUNTER — Other Ambulatory Visit: Payer: Self-pay | Admitting: Family Medicine

## 2015-10-24 ENCOUNTER — Ambulatory Visit (INDEPENDENT_AMBULATORY_CARE_PROVIDER_SITE_OTHER): Payer: Medicare Other | Admitting: Primary Care

## 2015-10-24 ENCOUNTER — Encounter: Payer: Self-pay | Admitting: Primary Care

## 2015-10-24 VITALS — BP 122/68 | HR 64 | Temp 97.9°F | Wt 158.8 lb

## 2015-10-24 DIAGNOSIS — R319 Hematuria, unspecified: Secondary | ICD-10-CM

## 2015-10-24 DIAGNOSIS — N390 Urinary tract infection, site not specified: Secondary | ICD-10-CM | POA: Diagnosis not present

## 2015-10-24 LAB — POC URINALSYSI DIPSTICK (AUTOMATED)
Bilirubin, UA: NEGATIVE
Glucose, UA: NEGATIVE
KETONES UA: NEGATIVE
Nitrite, UA: NEGATIVE
PH UA: 6
PROTEIN UA: NEGATIVE
SPEC GRAV UA: 1.02
Urobilinogen, UA: NEGATIVE

## 2015-10-24 MED ORDER — CIPROFLOXACIN HCL 250 MG PO TABS: 250.0000 mg | ORAL_TABLET | Freq: Two times a day (BID) | ORAL | Status: DC

## 2015-10-24 NOTE — Patient Instructions (Signed)
You have a urinary tract infection.   Start ciprofloxacin antibiotics. Take 1 tablet by mouth twice daily for 7 days.  Ensure you are staying hydrated with water.  Please call me if no improvement in 3-4 days.  It was a pleasure meeting you!  Urinary Tract Infection Urinary tract infections (UTIs) can develop anywhere along your urinary tract. Your urinary tract is your body's drainage system for removing wastes and extra water. Your urinary tract includes two kidneys, two ureters, a bladder, and a urethra. Your kidneys are a pair of bean-shaped organs. Each kidney is about the size of your fist. They are located below your ribs, one on each side of your spine. CAUSES Infections are caused by microbes, which are microscopic organisms, including fungi, viruses, and bacteria. These organisms are so small that they can only be seen through a microscope. Bacteria are the microbes that most commonly cause UTIs. SYMPTOMS  Symptoms of UTIs may vary by age and gender of the patient and by the location of the infection. Symptoms in young women typically include a frequent and intense urge to urinate and a painful, burning feeling in the bladder or urethra during urination. Older women and men are more likely to be tired, shaky, and weak and have muscle aches and abdominal pain. A fever may mean the infection is in your kidneys. Other symptoms of a kidney infection include pain in your back or sides below the ribs, nausea, and vomiting. DIAGNOSIS To diagnose a UTI, your caregiver will ask you about your symptoms. Your caregiver will also ask you to provide a urine sample. The urine sample will be tested for bacteria and white blood cells. White blood cells are made by your body to help fight infection. TREATMENT  Typically, UTIs can be treated with medication. Because most UTIs are caused by a bacterial infection, they usually can be treated with the use of antibiotics. The choice of antibiotic and length  of treatment depend on your symptoms and the type of bacteria causing your infection. HOME CARE INSTRUCTIONS  If you were prescribed antibiotics, take them exactly as your caregiver instructs you. Finish the medication even if you feel better after you have only taken some of the medication.  Drink enough water and fluids to keep your urine clear or pale yellow.  Avoid caffeine, tea, and carbonated beverages. They tend to irritate your bladder.  Empty your bladder often. Avoid holding urine for long periods of time.  Empty your bladder before and after sexual intercourse.  After a bowel movement, women should cleanse from front to back. Use each tissue only once. SEEK MEDICAL CARE IF:   You have back pain.  You develop a fever.  Your symptoms do not begin to resolve within 3 days. SEEK IMMEDIATE MEDICAL CARE IF:   You have severe back pain or lower abdominal pain.  You develop chills.  You have nausea or vomiting.  You have continued burning or discomfort with urination. MAKE SURE YOU:   Understand these instructions.  Will watch your condition.  Will get help right away if you are not doing well or get worse.   This information is not intended to replace advice given to you by your health care provider. Make sure you discuss any questions you have with your health care provider.   Document Released: 02/17/2005 Document Revised: 01/29/2015 Document Reviewed: 06/18/2011 Elsevier Interactive Patient Education [employer].

## 2015-10-24 NOTE — Addendum Note (Signed)
Addended by: Jacqualin Combes on: 10/24/2015 03:27 PM   Modules accepted: Orders, SmartSet

## 2015-10-24 NOTE — Progress Notes (Signed)
Subjective:    [name], female    DOB: 23-Sep-1940, 75 y.o.   MRN: [id number]  HPI  [name] [name] is a 75 year old female with a history of recurrent urinary tract infections and urinary incontinence who presents today with a chief complaint of dysuria. She also reports urinary frequency, and lower back pain. Her symptoms have ben present for about 2 weeks, but she noticed an increase in symptoms several days ago. Denies fevers, but experienced chills. Denies abdominal pain, vaginal discharge, vaginal itching. She's taken cranberry pills with an improvement in symptoms.   Review of Systems  Constitutional: Positive for chills and fatigue. Negative for fever.  Gastrointestinal: Negative for abdominal pain.  Genitourinary: Positive for dysuria and frequency. Negative for hematuria, vaginal discharge and pelvic pain.  Musculoskeletal: Positive for back pain.       Past Medical History  Diagnosis Date  . Diverticular disease 10/09/09    CT abd no sigmoid mass DDD L5/S1  . Peptic ulcer   . GERD (gastroesophageal reflux disease)   . Hiatal hernia   . Hypertension   . Dyslipidemia   . Chronic cystitis     recurrent UTI (4+ in last year), on suppressive doxy (uro Cope)  . Hematuria     h/o kidney stones  . COPD, severe 01/27/07       . Spinal abscess ([place]) 2000    secondary to periodontal disease  . Osteoarthritis   . Depression with anxiety   . Osteopenia 08/2011    DEXA: L femur -1.6  . Left scapholunate ligament tear 2014    found on imaging  . Colon polyp   . Lung cancer ([place]) 1987    Partial RLL Lobectomy.     Social History   Social History  . Marital Status: Married    Spouse Name: N/A  . Number of Children: 2  . Years of Education: N/A   Occupational History  . Retired     prior Glass blower/designer at [employer] History Main Topics  . Smoking status: Former Smoker -- 3.00 packs/day for 34 years    Types: Cigarettes    Quit  date: 05/29/1995  . Smokeless tobacco: Never Used  . Alcohol Use: 0.0 oz/week    0 Standard drinks or equivalent per week     Comment: wine daily 2-3 glasses/day  . Drug Use: No  . Sexual Activity: Not on file   Other Topics Concern  . Not on file   Social History Narrative   Caffeine: 4 cups coffee   Remarried for the third time, 2nd husband died of heart attack. Two stepsons   Activity: Walks for exercise   Diet: good water, fruits/vegetables daily      Advanced directed - would want HCPOA to be husband, Karlton Lemon. Does not have set up - requests handout.    Past Surgical History  Procedure Laterality Date  . Tubal ligation      bilateral  . Bladder surgery    . Mouth surgery  [place] pneumonia sepsis due to gum surgery  . Cystoscopy  02/11/07 and 09/25/08    Dr. Jacqlyn Larsen, normal  . Cholecystectomy  02/10/09  . Total abdominal hysterectomy  07/18/01    benign reason, BSO, vag repair secondary prolapse  . Replacement total knee  09/2009    Right  . Dexa  08/2011    T femur -1.6, T spine -0.6  . Colonoscopy  with propofol N/A 04/30/2014    Procedure: COLONOSCOPY WITH PROPOFOL;  Surgeon: Irene Shipper, MD;  Location: [facility];  Service: Endoscopy;  Laterality: N/A;  . Breast biopsy Left 03/99    neg    Family History  Problem Relation Age of Onset  . Pancreatitis Mother   . Heart disease Father   . Hypertension Father   . Emphysema Father   . Arthritis Brother   . Hypertension Brother   . Cancer Maternal Aunt     ovarian cancer    Allergies  Allergen Reactions  . Keflex [Cephalexin] Shortness Of Breath  . Penicillins     REACTION: hives    Current Outpatient Prescriptions on File Prior to Visit  Medication Sig Dispense Refill  . albuterol (PROAIR HFA) 108 (90 BASE) MCG/ACT inhaler Inhale 2 puffs into the lungs every 4 (four) hours as needed. 1 Inhaler 5  . arformoterol (BROVANA) 15 MCG/2ML NEBU Take 2 mLs (15 mcg total) by nebulization 2 (two)  times daily. 120 mL 11  . aspirin EC 81 MG tablet Take 1 tablet (81 mg total) by mouth every Monday, Wednesday, and Friday.    . budesonide (PULMICORT) 0.25 MG/2ML nebulizer solution Take 4 mLs (0.5 mg total) by nebulization 2 (two) times daily. 60 mL 12  . buPROPion (WELLBUTRIN SR) 150 MG 12 hr tablet TAKE ONE TABLET BY MOUTH TWICE DAILY 60 tablet 11  . CRANBERRY PO Take 2 tablets by mouth every morning.     . ezetimibe (ZETIA) 10 MG tablet Take 1 tablet (10 mg total) by mouth daily. 30 tablet 11  . losartan (COZAAR) 50 MG tablet TAKE ONE TABLET BY MOUTH EVERY DAY 30 tablet 11  . metoprolol tartrate (LOPRESSOR) 25 MG tablet TAKE ONE TABLET BY MOUTH TWICE DAILY 60 tablet 11  . MYRBETRIQ 25 MG TB24 tablet TAKE ONE TABLET BY MOUTH EVERY DAY 30 tablet 11  . omeprazole (PRILOSEC) 40 MG capsule TAKE ONE CAPSULE BY MOUTH EVERY DAY 30 capsule 11  . pravastatin (PRAVACHOL) 80 MG tablet ONE TABLET BY MOUTH AT BEDTIME 30 tablet 11  . PROAIR HFA 108 (90 Base) MCG/ACT inhaler INHALE 2 PUFFS EVERY 4 HOURS AS NEEDED 8.5 g 5  . psyllium (METAMUCIL) 58.6 % powder Take 1 packet by mouth at bedtime.     . sertraline (ZOLOFT) 100 MG tablet Take one tablet in the morning and one-half at bedtime 45 tablet 11  . SPIRIVA HANDIHALER 18 MCG inhalation capsule INHALE CONTENTS OF 1 CAPSULE ONCE DAILY. 30 capsule 5  . vitamin B-12 (CYANOCOBALAMIN) 1000 MCG tablet Take 1,000 mcg by mouth daily.     No current facility-administered medications on file prior to visit.    BP 122/68 mmHg  Pulse 64  Temp(Src) 97.9 F (36.6 C) (Oral)  Wt 158 lb 12.8 oz (72.031 kg)  SpO2 95%    Objective:   Physical Exam  Constitutional: She appears well-nourished. She does not appear ill.  Cardiovascular: Normal rate and regular rhythm.   Pulmonary/Chest: Effort normal and breath sounds normal.  Abdominal: Soft. There is no tenderness. There is no CVA tenderness.  Skin: Skin is warm and dry.          Assessment & Plan:     Dysuria, urinary frequency, lower back pain. History of recurrent UTIs and incontinence. UA: 3+ leuks, no nitrites, 3+ blood. Culture sent. Exam unremarkable, does not appear ill.  Will treat with low dose Cipro 7 day course. Increase water consumption, rest, follow  up as needed.

## 2015-10-27 LAB — URINE CULTURE

## 2015-10-30 ENCOUNTER — Other Ambulatory Visit: Payer: Self-pay | Admitting: *Deleted

## 2015-10-30 MED ORDER — PRAVASTATIN SODIUM 80 MG PO TABS: ORAL_TABLET | ORAL | Status: DC

## 2015-11-14 DIAGNOSIS — R0902 Hypoxemia: Secondary | ICD-10-CM | POA: Diagnosis not present

## 2015-11-14 DIAGNOSIS — J309 Allergic rhinitis, unspecified: Secondary | ICD-10-CM | POA: Diagnosis not present

## 2015-11-17 ENCOUNTER — Telehealth: Payer: Self-pay | Admitting: Family Medicine

## 2015-11-17 NOTE — Telephone Encounter (Signed)
Patient Name: [name] [name]  DOB: [birth year]    Initial Comment Caller states has a shooting pain behind ear going up side of head   Nurse Assessment  Nurse: Raphael Gibney, RN, Vanita Ingles Date/Time (Eastern Time): 11/17/2015 9:03:35 AM  Confirm and document reason for call. If symptomatic, describe symptoms. You must click the next button to save text entered. ---Caller states she has a shooting pain behind her right ear. Started yesterday. No ear ache. Pain was excruciating yesterday. Pain is intermittent.  Has the patient traveled out of the country within the last 30 days? ---Not Applicable  Does the patient have any new or worsening symptoms? ---Yes  Will a triage be completed? ---Yes  Related visit to physician within the last 2 weeks? ---No  Does the PT have any chronic conditions? (i.e. diabetes, asthma, etc.) ---Yes  List chronic conditions. ---HTN  Is this a behavioral health or substance abuse call? ---No     Guidelines    Guideline Title Affirmed Question Affirmed Notes  Headache [1] New headache AND [2] age > 85    Final Disposition User   See Physician within 24 Hours [place], RN, Vanita Ingles    Comments  appt scheduled for 9:15 am 11/18/2015 with Dr. Ria Bush   Referrals  REFERRED TO PCP OFFICE  REFERRED TO PCP OFFICE   Disagree/Comply: Comply

## 2015-11-17 NOTE — Telephone Encounter (Signed)
Pt has appt 11/18/15 with Dr Darnell Level.

## 2015-11-18 ENCOUNTER — Encounter: Payer: Self-pay | Admitting: Family Medicine

## 2015-11-18 ENCOUNTER — Ambulatory Visit (INDEPENDENT_AMBULATORY_CARE_PROVIDER_SITE_OTHER): Payer: Medicare Other | Admitting: Family Medicine

## 2015-11-18 VITALS — BP 126/74 | HR 57 | Temp 97.7°F | Wt 156.8 lb

## 2015-11-18 DIAGNOSIS — S81802D Unspecified open wound, left lower leg, subsequent encounter: Secondary | ICD-10-CM | POA: Diagnosis not present

## 2015-11-18 DIAGNOSIS — H7391 Unspecified disorder of tympanic membrane, right ear: Secondary | ICD-10-CM | POA: Insufficient documentation

## 2015-11-18 DIAGNOSIS — G500 Trigeminal neuralgia: Secondary | ICD-10-CM | POA: Diagnosis not present

## 2015-11-18 MED ORDER — GABAPENTIN 100 MG PO CAPS: 100.0000 mg | ORAL_CAPSULE | Freq: Two times a day (BID) | ORAL | Status: DC

## 2015-11-18 NOTE — Assessment & Plan Note (Signed)
-

## 2015-11-18 NOTE — Progress Notes (Signed)
Pre visit review using our clinic review tool, if applicable. No additional management support is needed unless otherwise documented below in the visit note. 

## 2015-11-18 NOTE — Assessment & Plan Note (Addendum)
Retracted R TM - rec restart nasacort.  No fluid or infection present.

## 2015-11-18 NOTE — Progress Notes (Signed)
BP 126/74 mmHg  Pulse 57  Temp(Src) 97.7 F (36.5 C) (Oral)  Wt 156 lb 12 oz (71.101 kg)  SpO2 98%   CC: headache  Subjective:    [name], female    DOB: 25-Dec-1940, 75 y.o.   MRN: [id number]  HPI: [name] [name] is a 75 y.o. female presenting on 11/18/2015 for Headache   5d h/o shooting pain up right neck into head. Occuring every 5 minutes. Describes electrical shock pain parietal region. Spares face.   + chills, no fevers. Endorses congestion, rhinorrhea. Cough present (chronic). No vision changes, confusion, slurred speech. Similar episode in the past, got better with time.   No new medications other than zetia (06/2015).   Relevant past medical, surgical, family and social history reviewed and updated as indicated. Interim medical history since our last visit reviewed. Allergies and medications reviewed and updated. Current Outpatient Prescriptions on File Prior to Visit  Medication Sig  . albuterol (PROAIR HFA) 108 (90 BASE) MCG/ACT inhaler Inhale 2 puffs into the lungs every 4 (four) hours as needed.  Marland Kitchen arformoterol (BROVANA) 15 MCG/2ML NEBU Take 2 mLs (15 mcg total) by nebulization 2 (two) times daily.  Marland Kitchen aspirin EC 81 MG tablet Take 1 tablet (81 mg total) by mouth every Monday, Wednesday, and Friday.  . budesonide (PULMICORT) 0.25 MG/2ML nebulizer solution Take 4 mLs (0.5 mg total) by nebulization 2 (two) times daily.  Marland Kitchen buPROPion (WELLBUTRIN SR) 150 MG 12 hr tablet TAKE ONE TABLET BY MOUTH TWICE DAILY  . CRANBERRY PO Take 2 tablets by mouth every morning.   . ezetimibe (ZETIA) 10 MG tablet Take 1 tablet (10 mg total) by mouth daily.  Marland Kitchen losartan (COZAAR) 50 MG tablet TAKE ONE TABLET BY MOUTH EVERY DAY  . metoprolol tartrate (LOPRESSOR) 25 MG tablet TAKE ONE TABLET BY MOUTH TWICE DAILY  . MYRBETRIQ 25 MG TB24 tablet TAKE ONE TABLET BY MOUTH EVERY DAY  . omeprazole (PRILOSEC) 40 MG capsule TAKE ONE CAPSULE BY MOUTH EVERY DAY  . pravastatin  (PRAVACHOL) 80 MG tablet ONE TABLET BY MOUTH AT BEDTIME  . PROAIR HFA 108 (90 Base) MCG/ACT inhaler INHALE 2 PUFFS EVERY 4 HOURS AS NEEDED  . psyllium (METAMUCIL) 58.6 % powder Take 1 packet by mouth at bedtime.   . sertraline (ZOLOFT) 100 MG tablet Take one tablet in the morning and one-half at bedtime  . SPIRIVA HANDIHALER 18 MCG inhalation capsule INHALE CONTENTS OF 1 CAPSULE ONCE DAILY.  . vitamin B-12 (CYANOCOBALAMIN) 1000 MCG tablet Take 1,000 mcg by mouth daily.   No current facility-administered medications on file prior to visit.    Review of Systems Per HPI unless specifically indicated in ROS section     Objective:    BP 126/74 mmHg  Pulse 57  Temp(Src) 97.7 F (36.5 C) (Oral)  Wt 156 lb 12 oz (71.101 kg)  SpO2 98%  Wt Readings from Last 3 Encounters:  11/18/15 156 lb 12 oz (71.101 kg)  10/24/15 158 lb 12.8 oz (72.031 kg)  10/21/15 156 lb (70.761 kg)    Physical Exam  Constitutional: She is oriented to person, place, and time. She appears well-developed and well-nourished. No distress.  HENT:  Head: Normocephalic and atraumatic.  Right Ear: Hearing, external ear and ear canal normal.  Left Ear: Hearing, tympanic membrane, external ear and ear canal normal.  Nose: Nose normal.  Mouth/Throat: Oropharynx is clear and moist. No oropharyngeal exudate.  Cerumen removed with alligator forceps from R external  ear canal R retracted TM  Eyes: Conjunctivae and EOM are normal. Pupils are equal, round, and reactive to light.  Neck: Normal range of motion. Neck supple. No thyromegaly present.  Cardiovascular: Normal rate, regular rhythm, normal heart sounds and intact distal pulses.   No murmur heard. Pulmonary/Chest: Effort normal and breath sounds normal. No respiratory distress. She has no wheezes. She has no rales.  Musculoskeletal: Normal range of motion. She exhibits no edema.  Lymphadenopathy:    She has no cervical adenopathy.  Neurological: She is alert and oriented  to person, place, and time. She has normal strength. No cranial nerve deficit or sensory deficit. Coordination normal.  CN 2-12 intact FTN intact EOMI  Skin: Skin is warm and dry. No rash noted.  Psychiatric: She has a normal mood and affect.  Nursing note and vitals reviewed.  Results for orders placed or performed in visit on 10/24/15  Urine culture  Result Value Ref Range   Culture ESCHERICHIA COLI    Colony Count >=100,000 COLONIES/ML    Organism ID, Bacteria ESCHERICHIA COLI       Susceptibility   Escherichia coli -  (no method available)    AMPICILLIN <=2 Sensitive     AMOX/CLAVULANIC <=2 Sensitive     AMPICILLIN/SULBACTAM <=2 Sensitive     PIP/TAZO <=4 Sensitive     IMIPENEM <=0.25 Sensitive     CEFAZOLIN <=4 Not Reportable     CEFTRIAXONE <=1 Sensitive     CEFTAZIDIME <=1 Sensitive     CEFEPIME <=1 Sensitive     GENTAMICIN <=1 Sensitive     TOBRAMYCIN <=1 Sensitive     CIPROFLOXACIN <=0.25 Sensitive     LEVOFLOXACIN <=0.12 Sensitive     NITROFURANTOIN <=16 Sensitive     TRIMETH/SULFA* <=20 Sensitive      * NR=NOT REPORTABLE,SEE COMMENTORAL therapy:A cefazolin MIC of <32 predicts susceptibility to the oral agents cefaclor,cefdinir,cefpodoxime,cefprozil,cefuroxime,cephalexin,and loracarbef when used for therapy of uncomplicated UTIs due to E.coli,K.pneumomiae,and P.mirabilis. PARENTERAL therapy: A cefazolinMIC of >8 indicates resistance to parenteralcefazolin. An alternate test method must beperformed to confirm susceptibility to parenteralcefazolin.  POCT Urinalysis Dipstick (Automated)  Result Value Ref Range   Color, UA yellow    Clarity, UA cloudy    Glucose, UA negative    Bilirubin, UA negative    Ketones, UA negative    Spec Grav, UA 1.020    Blood, UA 3+    pH, UA 6.0    Protein, UA negative    Urobilinogen, UA negative    Nitrite, UA negative    Leukocytes, UA large (3+) (A) Negative      Assessment & Plan:   Problem List Items Addressed This Visit     Trigeminal neuralgia of right side of face - Primary    Along V3 nerve distribution. Handout provided. Treat with gabapentin '100mg'$  BID with option to titrate as needed.       Relevant Medications   gabapentin (NEURONTIN) 100 MG capsule   Abnormal tympanic membrane of right ear    Retracted R TM - rec restart nasacort.  No fluid or infection present.       RESOLVED: Open wound of left lower leg    Fully healed.           Follow up plan: Return if symptoms worsen or fail to improve.  Ria Bush, MD

## 2015-11-18 NOTE — Assessment & Plan Note (Signed)
Along V3 nerve distribution. Handout provided. Treat with gabapentin '100mg'$  BID with option to titrate as needed.

## 2015-11-18 NOTE — Patient Instructions (Addendum)
You have trigeminal neuralgia - treat with gabapentin '100mg'$  twice daily. Update me with effect - we have room to increase dose if needed. You also have change in pressure in right ear - treat with restarting nasacort daily.   Trigeminal Neuralgia Trigeminal neuralgia is a nerve disorder that causes attacks of severe facial pain. The attacks last from a few seconds to several minutes. They can happen for days, weeks, or months and then go away for months or years. Trigeminal neuralgia is also called tic douloureux. CAUSES This condition is caused by damage to a nerve in the face that is called the trigeminal nerve. An attack can be triggered by:  Talking.  Chewing.  Putting on makeup.  Washing your face.  Shaving your face.  Brushing your teeth.  Touching your face. RISK FACTORS This condition is more likely to develop in:  Women.  People who are 27 years of age or older. SYMPTOMS The main symptom of this condition is pain in the jaw, lips, eyes, nose, scalp, forehead, and face. The pain may be intense, stabbing, electric, or shock-like. DIAGNOSIS This condition is diagnosed with a physical exam. A CT scan or MRI may be done to rule out other conditions that can cause facial pain. TREATMENT This condition may be treated with:  Avoiding the things that trigger your attacks.  Pain medicine.  Surgery. This may be done in severe cases if other medical treatment does not provide relief. HOME CARE INSTRUCTIONS  Take over-the-counter and prescription medicines only as told by your health care provider.  If you wish to get pregnant, talk with your health care provider before you start trying to get pregnant.  Avoid the things that trigger your attacks. It may help to:  Chew on the unaffected side of your mouth.  Avoid touching your face.  Avoid blasts of hot or cold air. SEEK MEDICAL CARE IF:  Your pain medicine is not helping.  You develop new, unexplained symptoms,  such as:  Double vision.  Facial weakness.  Changes in hearing or balance.  You become pregnant. SEEK IMMEDIATE MEDICAL CARE IF:  Your pain is unbearable, and your pain medicine does not help.   This information is not intended to replace advice given to you by your health care provider. Make sure you discuss any questions you have with your health care provider.   Document Released: 05/07/2000 Document Revised: 01/29/2015 Document Reviewed: 09/02/2014 Elsevier Interactive Patient Education [employer].

## 2015-11-20 ENCOUNTER — Telehealth: Payer: Self-pay

## 2015-11-20 NOTE — Telephone Encounter (Signed)
Patient notified and verbalized understanding. Will call Monday with an update.

## 2015-11-20 NOTE — Telephone Encounter (Signed)
As long as no side effects (dizziness, sedation), let's increase to '100mg'$  two tablets twice daily.  Update [place] with effect on Monday.

## 2015-11-20 NOTE — Telephone Encounter (Signed)
Pt left v/m; pt was seen 11/18/15; gabapentin 100 mg was given and that is not helping pain; pt wants to know if can increase med.pt request cb.

## 2015-11-21 ENCOUNTER — Telehealth (HOSPITAL_COMMUNITY): Payer: Self-pay

## 2015-11-21 NOTE — Telephone Encounter (Signed)
Called patient in regards to Pulmonary Rehab at [facility]. Patient lives closer to [facility]. Changed referral to [facility].

## 2015-11-26 ENCOUNTER — Telehealth: Payer: Self-pay | Admitting: Pulmonary Disease

## 2015-11-26 DIAGNOSIS — J432 Centrilobular emphysema: Secondary | ICD-10-CM

## 2015-11-26 NOTE — Telephone Encounter (Signed)
Pt does not have PFT in chart to show dx of COPD. They need a new order for rehab with a different diagnosis.   BQ Please advise as to which diagnosis to use for Pulm Rehab. Thanks!

## 2015-11-27 NOTE — Telephone Encounter (Signed)
Order has been placed. Nothing further needed. 

## 2015-11-27 NOTE — Telephone Encounter (Signed)
Change diagnosis to centrilobular emphysema

## 2015-12-02 ENCOUNTER — Telehealth: Payer: Self-pay | Admitting: Pulmonary Disease

## 2015-12-02 NOTE — Telephone Encounter (Signed)
Spoke with pt, pt wanted clarification on her neb meds.  All questions answered.  Nothing further needed.

## 2015-12-11 ENCOUNTER — Telehealth: Payer: Self-pay | Admitting: Pulmonary Disease

## 2015-12-11 ENCOUNTER — Other Ambulatory Visit: Payer: Self-pay | Admitting: *Deleted

## 2015-12-11 MED ORDER — BUDESONIDE 0.5 MG/2ML IN SUSP: 0.5000 mg | Freq: Two times a day (BID) | RESPIRATORY_TRACT | Status: DC

## 2015-12-11 MED ORDER — BUDESONIDE 0.25 MG/2ML IN SUSP: 0.5000 mg | Freq: Two times a day (BID) | RESPIRATORY_TRACT | Status: DC

## 2015-12-11 NOTE — Telephone Encounter (Signed)
rx sent to pharmacy. Patient aware. Nothing further needed.

## 2015-12-11 NOTE — Telephone Encounter (Signed)
ATC pt. Phone kept ringing. No VM set up

## 2015-12-11 NOTE — Telephone Encounter (Signed)
Pharmacy calling to confirm Rx for Budesonide. Spoke with [employer] and confirmed Rx. Nothing further needed.

## 2015-12-14 DIAGNOSIS — J309 Allergic rhinitis, unspecified: Secondary | ICD-10-CM | POA: Diagnosis not present

## 2015-12-14 DIAGNOSIS — R0902 Hypoxemia: Secondary | ICD-10-CM | POA: Diagnosis not present

## 2015-12-15 MED ORDER — ALBUTEROL SULFATE (2.5 MG/3ML) 0.083% IN NEBU: INHALATION_SOLUTION | RESPIRATORY_TRACT | 5 refills | Status: DC

## 2015-12-15 NOTE — Telephone Encounter (Signed)
Spoke with pt.  She states that she has been out of her Albuterol neb for a little while and is requesting a refill.  This is not on her med list.  She states she also uses Proair prn and Brovana and Budesonide Bid via neb.  Please advise if ok to rx Albuterol neb for pt.

## 2015-12-15 NOTE — Telephone Encounter (Signed)
Called spoke with pt. Reviewed BQ's recs and verified pharmacy as [facility]. She voiced understanding and had no further questions. Rx was sent to the pharmacy. Nothing further needed.

## 2015-12-15 NOTE — Telephone Encounter (Signed)
OK to refill, q4-6h prn dyspnea

## 2015-12-16 ENCOUNTER — Encounter: Payer: Medicare Other | Attending: Pulmonary Disease | Admitting: Respiratory Therapy

## 2015-12-16 DIAGNOSIS — J432 Centrilobular emphysema: Secondary | ICD-10-CM | POA: Insufficient documentation

## 2015-12-16 DIAGNOSIS — J449 Chronic obstructive pulmonary disease, unspecified: Secondary | ICD-10-CM

## 2015-12-16 NOTE — Progress Notes (Signed)
Pulmonary Individual Treatment Plan  Patient Details  Name: RAYLYNNE CUBBAGE MRN: [id number] Date of Birth: 12-21-1940 Referring Provider:   Flowsheet Row Pulmonary Rehab from 12/16/2015 in [facility] Cardiac and Pulmonary Rehab  Referring Provider  Simonne Maffucci MD      Initial Encounter Date:  Flowsheet Row Pulmonary Rehab from 12/16/2015 in [facility] Cardiac and Pulmonary Rehab  Date  12/16/15  Referring Provider  Simonne Maffucci MD      Visit Diagnosis: COPD, severe ([place])  Patient's Home Medications on Admission:  Current Outpatient Prescriptions:    albuterol (PROAIR HFA) 108 (90 BASE) MCG/ACT inhaler, Inhale 2 puffs into the lungs every 4 (four) hours as needed., Disp: 1 Inhaler, Rfl: 5   albuterol (PROVENTIL) (2.5 MG/3ML) 0.083% nebulizer solution, Take every 4-6 hours as needed for shortness of breath, Disp: 75 mL, Rfl: 5   arformoterol (BROVANA) 15 MCG/2ML NEBU, Take 2 mLs (15 mcg total) by nebulization 2 (two) times daily., Disp: 120 mL, Rfl: 11   aspirin EC 81 MG tablet, Take 1 tablet (81 mg total) by mouth every Monday, Wednesday, and Friday., Disp: , Rfl:    budesonide (PULMICORT) 0.5 MG/2ML nebulizer solution, Take 2 mLs (0.5 mg total) by nebulization 2 (two) times daily., Disp: 60 mL, Rfl: 12   buPROPion (WELLBUTRIN SR) 150 MG 12 hr tablet, TAKE ONE TABLET BY MOUTH TWICE DAILY, Disp: 60 tablet, Rfl: 11   CRANBERRY PO, Take 2 tablets by mouth every morning. , Disp: , Rfl:    ezetimibe (ZETIA) 10 MG tablet, Take 1 tablet (10 mg total) by mouth daily., Disp: 30 tablet, Rfl: 11   gabapentin (NEURONTIN) 100 MG capsule, Take 2 capsules (200 mg total) by mouth 2 (two) times daily., Disp: , Rfl:    losartan (COZAAR) 50 MG tablet, TAKE ONE TABLET BY MOUTH EVERY DAY, Disp: 30 tablet, Rfl: 11   metoprolol tartrate (LOPRESSOR) 25 MG tablet, TAKE ONE TABLET BY MOUTH TWICE DAILY, Disp: 60 tablet, Rfl: 11   MYRBETRIQ 25 MG TB24 tablet, TAKE ONE TABLET BY MOUTH EVERY DAY, Disp: 30  tablet, Rfl: 11   omeprazole (PRILOSEC) 40 MG capsule, TAKE ONE CAPSULE BY MOUTH EVERY DAY, Disp: 30 capsule, Rfl: 11   pravastatin (PRAVACHOL) 80 MG tablet, ONE TABLET BY MOUTH AT BEDTIME, Disp: 90 tablet, Rfl: 1   PROAIR HFA 108 (90 Base) MCG/ACT inhaler, INHALE 2 PUFFS EVERY 4 HOURS AS NEEDED, Disp: 8.5 g, Rfl: 5   psyllium (METAMUCIL) 58.6 % powder, Take 1 packet by mouth at bedtime. , Disp: , Rfl:    sertraline (ZOLOFT) 100 MG tablet, Take one tablet in the morning and one-half at bedtime, Disp: 45 tablet, Rfl: 11   SPIRIVA HANDIHALER 18 MCG inhalation capsule, INHALE CONTENTS OF 1 CAPSULE ONCE DAILY., Disp: 30 capsule, Rfl: 5   triamcinolone (NASACORT ALLERGY 24HR) 55 MCG/ACT AERO nasal inhaler, Place 2 sprays into the nose daily., Disp: , Rfl:    vitamin B-12 (CYANOCOBALAMIN) 1000 MCG tablet, Take 1,000 mcg by mouth daily., Disp: , Rfl:   Past Medical History: Past Medical History:  Diagnosis Date   Chronic cystitis    recurrent UTI (4+ in last year), on suppressive doxy (uro Cope)   Colon polyp    COPD, severe 01/27/07       Depression with anxiety    Diverticular disease 10/09/09   CT abd no sigmoid mass DDD L5/S1   Dyslipidemia    GERD (gastroesophageal reflux disease)    Hematuria    h/o kidney  stones   Hiatal hernia    Hypertension    Left scapholunate ligament tear 2014   found on imaging   Lung cancer ([place]) 1987   Partial RLL Lobectomy.   Osteoarthritis    Osteopenia 08/2011   DEXA: L femur -1.6   Peptic ulcer    Spinal abscess ([place]) 2000   secondary to periodontal disease    Tobacco Use: History  Smoking Status   Former Smoker   Packs/day: 3.00   Years: 34.00   Types: Cigarettes   Quit date: 05/29/1995  Smokeless Tobacco   Never Used    Labs: Recent Review Flowsheet Data    Labs for ITP Cardiac and Pulmonary Rehab Latest Ref Rng & Units 09/06/2011 11/13/2012 02/13/2014 02/25/2015 09/02/2015   Cholestrol 0 - 200 mg/dL 228(H)  188 219(H) 204(H) 147   LDLCALC 0 - 99 mg/dL - 88 108(H) 100(H) 57   LDLDIRECT mg/dL 135.9 - - - -   HDL >39.00 mg/dL 65.50 64.60 71.30 76.90 67.90   Trlycerides 0.0 - 149.0 mg/dL 166.0(H) 175.0(H) 200.0(H) 132.0 113.0   Hemoglobin A1c 4.6 - 6.5 % - - 5.5 - -       ADL UCSD:     Pulmonary Assessment Scores    Row Name 12/16/15 1511         ADL UCSD   ADL Phase Entry     SOB Score total 74     Rest 0     Walk 0     Stairs 0     Bath 2     Dress 4     Shop 5        Pulmonary Function Assessment:     Pulmonary Function Assessment - 12/16/15 1511      Initial Spirometry Results   Comments Test Date: 12/16/2015      Exercise Target Goals: Date: 12/16/15  Exercise Program Goal: Individual exercise prescription set with THRR, safety & activity barriers. Participant demonstrates ability to understand and report RPE using BORG scale, to self-measure pulse accurately, and to acknowledge the importance of the exercise prescription.  Exercise Prescription Goal: Starting with aerobic activity 30 plus minutes a day, 3 days per week for initial exercise prescription. Provide home exercise prescription and guidelines that participant acknowledges understanding prior to discharge.  Activity Barriers & Risk Stratification:     Activity Barriers & Cardiac Risk Stratification - 12/16/15 1507      Activity Barriers & Cardiac Risk Stratification   Activity Barriers Deconditioning;Shortness of Breath   Cardiac Risk Stratification Moderate      6 Minute Walk:     6 Minute Walk    Row Name 12/16/15 1544         6 Minute Walk   Phase Initial     Distance 1000 feet     Walk Time 6 minutes     # of Rest Breaks 0     MPH 1.89     METS 2.1     RPE 17     Perceived Dyspnea  2     VO2 Peak 7.35     Symptoms Yes (comment)     Comments fatigued     Resting HR 69 bpm     Resting BP 116/54     Max Ex. HR 91 bpm     Max Ex. BP 144/56     2 Minute Post BP 144/60        Interval HR   Baseline HR 69  1 Minute HR 80     2 Minute HR 85     3 Minute HR 84     4 Minute HR 89     5 Minute HR 89     6 Minute HR 91     2 Minute Post HR 82     Interval Heart Rate? Yes       Interval Oxygen   Interval Oxygen? Yes     Baseline Oxygen Saturation % 97 %     Baseline Liters of Oxygen 4 L     1 Minute Oxygen Saturation % 95 %     1 Minute Liters of Oxygen 4 L     2 Minute Oxygen Saturation % 94 %     2 Minute Liters of Oxygen 4 L     3 Minute Oxygen Saturation % 94 %     3 Minute Liters of Oxygen 4 L     4 Minute Oxygen Saturation % 95 %     4 Minute Liters of Oxygen 4 L     5 Minute Oxygen Saturation % 94 %     5 Minute Liters of Oxygen 4 L     6 Minute Oxygen Saturation % 96 %     6 Minute Liters of Oxygen 4 L     2 Minute Post Oxygen Saturation % 96 %     2 Minute Post Liters of Oxygen 4 L        Initial Exercise Prescription:     Initial Exercise Prescription - 12/16/15 1500      Date of Initial Exercise RX and Referring Provider   Date 12/16/15   Referring Provider Simonne Maffucci MD     Oxygen   Oxygen Continuous   Liters 4     Treadmill   MPH 1.4   Grade 0   Minutes 15   METs 2.07     NuStep   Level 1   Minutes 15   METs 2     Recumbant Elliptical   Level 1   RPM 50   Minutes 15   METs 2     Prescription Details   Frequency (times per week) 3   Duration Progress to 45 minutes of aerobic exercise without signs/symptoms of physical distress     Intensity   THRR 40-80% of Max Heartrate 99-131   Ratings of Perceived Exertion 11-13   Perceived Dyspnea 0-4     Progression   Progression Continue to progress workloads to maintain intensity without signs/symptoms of physical distress.     Resistance Training   Training Prescription Yes   Weight 2 lbs   Reps 10-12      Perform Capillary Blood Glucose checks as needed.  Exercise Prescription Changes:   Exercise Comments:     Exercise Comments    Row Name  12/16/15 1548           Exercise Comments Marypat wants to improve her ability to walk to be able to go shopping again.          Discharge Exercise Prescription (Final Exercise Prescription Changes):    Nutrition:  Target Goals: Understanding of nutrition guidelines, daily intake of sodium '1500mg'$ , cholesterol '200mg'$ , calories 30% from fat and 7% or less from saturated fats, daily to have 5 or more servings of fruits and vegetables.  Biometrics:    Nutrition Therapy Plan and Nutrition Goals:     Nutrition Therapy & Goals - 12/16/15 1515  Nutrition Therapy   Diet Ms Hilyard prefers not to meet with the dietitian. They are residents at a retirement community and eat their lunch at their dining facility.      Nutrition Discharge: Rate Your Plate Scores:   Psychosocial: Target Goals: Acknowledge presence or absence of depression, maximize coping skills, provide positive support system. Participant is able to verbalize types and ability to use techniques and skills needed for reducing stress and depression.  Initial Review & Psychosocial Screening:     Initial Psych Review & Screening - 12/16/15 [place]? Yes   Comments Ms Hechler has good support from her husband. She states She does not have depression, but medicates with Zoloft and Wellbutrin. Due to her shortness of breath, Ms Adriano misses certain activities she use to perform.     Barriers   Psychosocial barriers to participate in program The patient should benefit from training in stress management and relaxation.     Screening Interventions   Interventions Encouraged to exercise;Program counselor consult      Quality of Life Scores:     Quality of Life - 12/16/15 1524      Quality of Life Scores   Health/Function Pre 19.63 %   Socioeconomic Pre 20.33 %   Psych/Spiritual Pre 19.29 %   Family Pre 21 %   GLOBAL Pre 19.88 %      PHQ-9: Recent Review  Flowsheet Data    Depression screen [facility] 2/9 12/16/2015 03/04/2015 02/18/2014 11/13/2012   Decreased Interest 2 0  0 1   Down, Depressed, Hopeless 1 1 0 1   PHQ - 2 Score 3 1 0 2   Altered sleeping 0 - - 0   Tired, decreased energy 2 - - 2   Change in appetite 0 - - 0   Feeling bad or failure about yourself  0 - - 1   Trouble concentrating 0 - - 0   Moving slowly or fidgety/restless 0 - - 0   Suicidal thoughts 1 - - 0   PHQ-9 Score 6 - - 5   Difficult doing work/chores Very difficult - - -      Psychosocial Evaluation and Intervention:   Psychosocial Re-Evaluation:  Education: Education Goals: Education classes will be provided on a weekly basis, covering required topics. Participant will state understanding/return demonstration of topics presented.  Learning Barriers/Preferences:     Learning Barriers/Preferences - 12/16/15 1507      Learning Barriers/Preferences   Learning Barriers None   Learning Preferences Group Instruction;Individual Instruction;Pictoral;Skilled Demonstration;Verbal Instruction;Video;Written Material      Education Topics: Initial Evaluation Education: - Verbal, written and demonstration of respiratory meds, RPE/PD scales, oximetry and breathing techniques. Instruction on use of nebulizers and MDIs: cleaning and proper use, rinsing mouth with steroid doses and importance of monitoring MDI activations. Flowsheet Row Pulmonary Rehab from 12/16/2015 in [facility] Cardiac and Pulmonary Rehab  Date  12/16/15  Educator  LB  Instruction Review Code  2- meets goals/outcomes      General Nutrition Guidelines/Fats and Fiber: -Group instruction provided by verbal, written material, models and posters to present the general guidelines for heart healthy nutrition. Gives an explanation and review of dietary fats and fiber.   Controlling Sodium/Reading Food Labels: -Group verbal and written material supporting the discussion of sodium use in heart healthy nutrition.  Review and explanation with models, verbal and written materials for utilization of the food label.   Exercise  Physiology & Risk Factors: - Group verbal and written instruction with models to review the exercise physiology of the cardiovascular system and associated critical values. Details cardiovascular disease risk factors and the goals associated with each risk factor.   Aerobic Exercise & Resistance Training: - Gives group verbal and written discussion on the health impact of inactivity. On the components of aerobic and resistive training programs and the benefits of this training and how to safely progress through these programs.   Flexibility, Balance, General Exercise Guidelines: - Provides group verbal and written instruction on the benefits of flexibility and balance training programs. Provides general exercise guidelines with specific guidelines to those with heart or lung disease. Demonstration and skill practice provided.   Stress Management: - Provides group verbal and written instruction about the health risks of elevated stress, cause of high stress, and healthy ways to reduce stress.   Depression: - Provides group verbal and written instruction on the correlation between heart/lung disease and depressed mood, treatment options, and the stigmas associated with seeking treatment.   Exercise & Equipment Safety: - Individual verbal instruction and demonstration of equipment use and safety with use of the equipment.   Infection Prevention: - Provides verbal and written material to individual with discussion of infection control including proper hand washing and proper equipment cleaning during exercise session. Flowsheet Row Pulmonary Rehab from 12/16/2015 in [facility] Cardiac and Pulmonary Rehab  Date  12/16/15  Educator  LB  Instruction Review Code  2- meets goals/outcomes      Falls Prevention: - Provides verbal and written material to individual with discussion of falls  prevention and safety. Flowsheet Row Pulmonary Rehab from 12/16/2015 in [facility] Cardiac and Pulmonary Rehab  Date  12/16/15  Educator  LB  Instruction Review Code  2- meets goals/outcomes      Diabetes: - Individual verbal and written instruction to review signs/symptoms of diabetes, desired ranges of glucose level fasting, after meals and with exercise. Advice that pre and post exercise glucose checks will be done for 3 sessions at entry of program.   Chronic Lung Diseases: - Group verbal and written instruction to review new updates, new respiratory medications, new advancements in procedures and treatments. Provide informative websites and "800" numbers of self-education.   Lung Procedures: - Group verbal and written instruction to describe testing methods done to diagnose lung disease. Review the outcome of test results. Describe the treatment choices: Pulmonary Function Tests, ABGs and oximetry.   Energy Conservation: - Provide group verbal and written instruction for methods to conserve energy, plan and organize activities. Instruct on pacing techniques, use of adaptive equipment and posture/positioning to relieve shortness of breath.   Triggers: - Group verbal and written instruction to review types of environmental controls: home humidity, furnaces, filters, dust mite/pet prevention, HEPA vacuums. To discuss weather changes, air quality and the benefits of nasal washing.   Exacerbations: - Group verbal and written instruction to provide: warning signs, infection symptoms, calling MD promptly, preventive modes, and value of vaccinations. Review: effective airway clearance, coughing and/or vibration techniques. Create an Sports administrator.   Oxygen: - Individual and group verbal and written instruction on oxygen therapy. Includes supplement oxygen, available portable oxygen systems, continuous and intermittent flow rates, oxygen safety, concentrators, and Medicare reimbursement for  oxygen. Flowsheet Row Pulmonary Rehab from 12/16/2015 in [facility] Cardiac and Pulmonary Rehab  Date  12/16/15  Educator  LB  Instruction Review Code  2- meets goals/outcomes      Respiratory Medications: - Group  verbal and written instruction to review medications for lung disease. Drug class, frequency, complications, importance of spacers, rinsing mouth after steroid MDI's, and proper cleaning methods for nebulizers. Flowsheet Row Pulmonary Rehab from 12/16/2015 in [facility] Cardiac and Pulmonary Rehab  Date  12/16/15  Educator  LB  Instruction Review Code  2- meets goals/outcomes      AED/CPR: - Group verbal and written instruction with the use of models to demonstrate the basic use of the AED with the basic ABC's of resuscitation.   Breathing Retraining: - Provides individuals verbal and written instruction on purpose, frequency, and proper technique of diaphragmatic breathing and pursed-lipped breathing. Applies individual practice skills. Flowsheet Row Pulmonary Rehab from 12/16/2015 in [facility] Cardiac and Pulmonary Rehab  Date  12/16/15  Educator  LB  Instruction Review Code  2- meets goals/outcomes      Anatomy and Physiology of the Lungs: - Group verbal and written instruction with the use of models to provide basic lung anatomy and physiology related to function, structure and complications of lung disease.   Heart Failure: - Group verbal and written instruction on the basics of heart failure: signs/symptoms, treatments, explanation of ejection fraction, enlarged heart and cardiomyopathy.   Sleep Apnea: - Individual verbal and written instruction to review Obstructive Sleep Apnea. Review of risk factors, methods for diagnosing and types of masks and machines for OSA.   Anxiety: - Provides group, verbal and written instruction on the correlation between heart/lung disease and anxiety, treatment options, and management of anxiety.   Relaxation: - Provides group, verbal and  written instruction about the benefits of relaxation for patients with heart/lung disease. Also provides patients with examples of relaxation techniques.   Knowledge Questionnaire Score:     Knowledge Questionnaire Score - 12/16/15 1508      Knowledge Questionnaire Score   Pre Score 8/10       Core Components/Risk Factors/Patient Goals at Admission:     Personal Goals and Risk Factors at Admission - 12/16/15 1517      Core Components/Risk Factors/Patient Goals on Admission   Sedentary Yes  Residents at [facility], so access to their gym facility   Intervention Provide advice, education, support and counseling about physical activity/exercise needs.;Develop an individualized exercise prescription for aerobic and resistive training based on initial evaluation findings, risk stratification, comorbidities and participant's personal goals.   Expected Outcomes Achievement of increased cardiorespiratory fitness and enhanced flexibility, muscular endurance and strength shown through measurements of functional capacity and personal statement of participant.   Increase Strength and Stamina Yes   Intervention Provide advice, education, support and counseling about physical activity/exercise needs.;Develop an individualized exercise prescription for aerobic and resistive training based on initial evaluation findings, risk stratification, comorbidities and participant's personal goals.   Expected Outcomes Achievement of increased cardiorespiratory fitness and enhanced flexibility, muscular endurance and strength shown through measurements of functional capacity and personal statement of participant.   Improve shortness of breath with ADL's Yes   Intervention Provide education, individualized exercise plan and daily activity instruction to help decrease symptoms of SOB with activities of daily living.   Expected Outcomes Short Term: Achieves a reduction of symptoms when performing activities of daily  living.   Develop more efficient breathing techniques such as purse lipped breathing and diaphragmatic breathing; and practicing self-pacing with activity Yes   Intervention Provide education, demonstration and support about specific breathing techniuqes utilized for more efficient breathing. Include techniques such as pursed lipped breathing, diaphragmatic breathing and self-pacing activity.  Expected Outcomes Short Term: Participant will be able to demonstrate and use breathing techniques as needed throughout daily activities.   Increase knowledge of respiratory medications and ability to use respiratory devices properly  Yes  Uses SVN with Albuterol, Brovana, and Pulmicort; uses Spiriva; Albuterol MDI and spacer given. Oxygen 4-5l/m; portable concentrator   Intervention Provide education and demonstration as needed of appropriate use of medications, inhalers, and oxygen therapy.   Expected Outcomes Short Term: Achieves understanding of medications use. Understands that oxygen is a medication prescribed by physician. Demonstrates appropriate use of inhaler and oxygen therapy.   Personal Goal Other Yes   Personal Goal Able to go shopping again   Intervention Provide exercise prescription and education to increase functional capacity and improved shortness of breath   Expected Outcomes Able to gain strength and stamina to shopping.      Core Components/Risk Factors/Patient Goals Review:    Core Components/Risk Factors/Patient Goals at Discharge (Final Review):    ITP Comments:   Comments: Ms Lingard plans to start [place] on 12/24/2015 and attend 3 days/week.

## 2015-12-16 NOTE — Patient Instructions (Signed)
Patient Instructions  Patient Details  Name: [name] [name] MRN: [id number] Date of Birth: August 22, 1940 Referring Provider:  Juanito Doom, MD  Below are the personal goals you chose as well as exercise and nutrition goals. Our goal is to help you keep on track towards obtaining and maintaining your goals. We will be discussing your progress on these goals with you throughout the program.  Initial Exercise Prescription:     Initial Exercise Prescription - 12/16/15 1500      Date of Initial Exercise RX and Referring Provider   Date 12/16/15   Referring Provider Simonne Maffucci MD     Oxygen   Oxygen Continuous   Liters 4     Treadmill   MPH 1.4   Grade 0   Minutes 15   METs 2.07     NuStep   Level 1   Minutes 15   METs 2     Recumbant Elliptical   Level 1   RPM 50   Minutes 15   METs 2     Prescription Details   Frequency (times per week) 3   Duration Progress to 45 minutes of aerobic exercise without signs/symptoms of physical distress     Intensity   THRR 40-80% of Max Heartrate 99-131   Ratings of Perceived Exertion 11-13   Perceived Dyspnea 0-4     Progression   Progression Continue to progress workloads to maintain intensity without signs/symptoms of physical distress.     Resistance Training   Training Prescription Yes   Weight 2 lbs   Reps 10-12      Exercise Goals: Frequency: Be able to perform aerobic exercise three times per week working toward 3-5 days per week.  Intensity: Work with a perceived exertion of 11 (fairly light) - 15 (hard) as tolerated. Follow your new exercise prescription and watch for changes in prescription as you progress with the program. Changes will be reviewed with you when they are made.  Duration: You should be able to do 30 minutes of continuous aerobic exercise in addition to a 5 minute warm-up and a 5 minute cool-down routine.  Nutrition Goals: Your personal nutrition goals will be established when you do  your nutrition analysis with the dietician.  The following are nutrition guidelines to follow: Cholesterol < '200mg'$ /day Sodium < '1500mg'$ /day Fiber: Women over 50 yrs - 21 grams per day  Personal Goals:     Personal Goals and Risk Factors at Admission - 12/16/15 1517      Core Components/Risk Factors/Patient Goals on Admission   Sedentary Yes  Residents at [facility], so access to their gym facility   Intervention Provide advice, education, support and counseling about physical activity/exercise needs.;Develop an individualized exercise prescription for aerobic and resistive training based on initial evaluation findings, risk stratification, comorbidities and participant's personal goals.   Expected Outcomes Achievement of increased cardiorespiratory fitness and enhanced flexibility, muscular endurance and strength shown through measurements of functional capacity and personal statement of participant.   Increase Strength and Stamina Yes   Intervention Provide advice, education, support and counseling about physical activity/exercise needs.;Develop an individualized exercise prescription for aerobic and resistive training based on initial evaluation findings, risk stratification, comorbidities and participant's personal goals.   Expected Outcomes Achievement of increased cardiorespiratory fitness and enhanced flexibility, muscular endurance and strength shown through measurements of functional capacity and personal statement of participant.   Improve shortness of breath with ADL's Yes   Intervention Provide education, individualized exercise plan and daily  activity instruction to help decrease symptoms of SOB with activities of daily living.   Expected Outcomes Short Term: Achieves a reduction of symptoms when performing activities of daily living.   Develop more efficient breathing techniques such as purse lipped breathing and diaphragmatic breathing; and practicing self-pacing with activity  Yes   Intervention Provide education, demonstration and support about specific breathing techniuqes utilized for more efficient breathing. Include techniques such as pursed lipped breathing, diaphragmatic breathing and self-pacing activity.   Expected Outcomes Short Term: Participant will be able to demonstrate and use breathing techniques as needed throughout daily activities.   Increase knowledge of respiratory medications and ability to use respiratory devices properly  Yes  Uses SVN with Albuterol, Brovana, and Pulmicort; uses Spiriva; Albuterol MDI and spacer given. Oxygen 4-5l/m; portable concentrator   Intervention Provide education and demonstration as needed of appropriate use of medications, inhalers, and oxygen therapy.   Expected Outcomes Short Term: Achieves understanding of medications use. Understands that oxygen is a medication prescribed by physician. Demonstrates appropriate use of inhaler and oxygen therapy.   Personal Goal Other Yes   Personal Goal Able to go shopping again   Intervention Provide exercise prescription and education to increase functional capacity and improved shortness of breath   Expected Outcomes Able to gain strength and stamina to shopping.      Tobacco Use Initial Evaluation: History  Smoking Status   Former Smoker   Packs/day: 3.00   Years: 34.00   Types: Cigarettes   Quit date: 05/29/1995  Smokeless Tobacco   Never Used    Copy of goals given to participant.

## 2015-12-17 DIAGNOSIS — J441 Chronic obstructive pulmonary disease with (acute) exacerbation: Secondary | ICD-10-CM | POA: Diagnosis not present

## 2015-12-17 DIAGNOSIS — Z87440 Personal history of urinary (tract) infections: Secondary | ICD-10-CM | POA: Diagnosis not present

## 2015-12-17 DIAGNOSIS — R8290 Unspecified abnormal findings in urine: Secondary | ICD-10-CM | POA: Diagnosis not present

## 2015-12-17 DIAGNOSIS — N390 Urinary tract infection, site not specified: Secondary | ICD-10-CM | POA: Diagnosis not present

## 2015-12-24 ENCOUNTER — Encounter: Payer: Medicare Other | Attending: Pulmonary Disease

## 2015-12-24 DIAGNOSIS — J449 Chronic obstructive pulmonary disease, unspecified: Secondary | ICD-10-CM

## 2015-12-24 DIAGNOSIS — J432 Centrilobular emphysema: Secondary | ICD-10-CM | POA: Diagnosis not present

## 2015-12-24 NOTE — Progress Notes (Signed)
Daily Session Note  Patient Details  Name: [name] [name] MRN: [id number] Date of Birth: 04/09/1941 Referring Provider:   Flowsheet Row Pulmonary Rehab from 12/16/2015 in [facility] Cardiac and Pulmonary Rehab  Referring Provider  Simonne Maffucci MD      Encounter Date: 12/24/2015  Check In:     Session Check In - 12/24/15 1232      Check-In   Location [facility]-Cardiac & Pulmonary Rehab   Staff Present Heath Lark, RN, BSN, CCRP;Laureen Owens Shark, BS, RRT, Respiratory Dareen Piano, BA, ACSM CEP, Exercise Physiologist   Supervising physician immediately available to respond to emergencies [facility] immediately available ER MD   Physician(s) Quentin Cornwall and Marcelene Butte   Medication changes reported     No   Fall or balance concerns reported    No   Warm-up and Cool-down Performed on first and last piece of equipment   Resistance Training Performed Yes   VAD Patient? No     Pain Assessment   Currently in Pain? No/denies   Multiple Pain Sites No         Goals Met:  Proper associated with RPD/PD & O2 Sat Exercise tolerated well Strength training completed today  Goals Unmet:  Not Applicable  Comments: First full day of exercise!  Patient was oriented to gym and equipment including functions, settings, policies, and procedures.  Patient's individual exercise prescription and treatment plan were reviewed.  All starting workloads were established based on the results of the 6 minute walk test done at initial orientation visit.  The plan for exercise progression was also introduced and progression will be customized based on patient's performance and goals.    Dr. Emily Filbert is Medical Director for [place] and [facility].

## 2015-12-26 ENCOUNTER — Encounter: Payer: Medicare Other | Admitting: *Deleted

## 2015-12-26 DIAGNOSIS — J449 Chronic obstructive pulmonary disease, unspecified: Secondary | ICD-10-CM

## 2015-12-26 DIAGNOSIS — J432 Centrilobular emphysema: Secondary | ICD-10-CM | POA: Diagnosis not present

## 2015-12-26 NOTE — Progress Notes (Signed)
Daily Session Note  Patient Details  Name: [name] [name] MRN: [id number] Date of Birth: [birth year] Referring Provider:   Flowsheet Row Pulmonary Rehab from 12/16/2015 in [facility] Cardiac and Pulmonary Rehab  Referring Provider  Simonne Maffucci MD      Encounter Date: 12/26/2015  Check In:     Session Check In - 12/26/15 1212      Check-In   Location [facility]-Cardiac & Pulmonary Rehab   Staff Present Heath Lark, RN, BSN, CCRP;Laureen Owens Shark, BS, RRT, Respiratory Therapist;Carroll Enterkin, RN, BSN   Supervising physician immediately available to respond to emergencies [facility] immediately available ER MD   Physician(s) Drs: Kerman Passey and Darl Householder   Medication changes reported     No   Fall or balance concerns reported    No   Warm-up and Cool-down Performed on first and last piece of equipment   Resistance Training Performed Yes   VAD Patient? No     Pain Assessment   Currently in Pain? No/denies         Goals Met:  Exercise tolerated well Strength training completed today  Goals Unmet:  Not Applicable  Comments: Doing well with exercise prescription progression.    Dr. Emily Filbert is Medical Director for [place] and [facility].

## 2015-12-29 ENCOUNTER — Encounter: Payer: Self-pay | Admitting: *Deleted

## 2015-12-29 ENCOUNTER — Encounter: Payer: Medicare Other | Admitting: *Deleted

## 2015-12-29 DIAGNOSIS — J449 Chronic obstructive pulmonary disease, unspecified: Secondary | ICD-10-CM

## 2015-12-29 DIAGNOSIS — J432 Centrilobular emphysema: Secondary | ICD-10-CM | POA: Diagnosis not present

## 2015-12-29 NOTE — Progress Notes (Signed)
Daily Session Note  Patient Details  Name: [name] [name] MRN: [id number] Date of Birth: 06-Nov-1940 Referring Provider:   Flowsheet Row Pulmonary Rehab from 12/16/2015 in [facility] Cardiac and Pulmonary Rehab  Referring Provider  Simonne Maffucci MD      Encounter Date: 12/29/2015  Check In:     Session Check In - 12/29/15 1242      Check-In   Location [facility]-Cardiac & Pulmonary Rehab   Staff Present Alberteen Sam, MA, ACSM RCEP, Exercise Physiologist;Kelly Amedeo Plenty, BS, ACSM CEP, Exercise Physiologist;Laureen Owens Shark, BS, RRT, Respiratory Therapist   Supervising physician immediately available to respond to emergencies [facility] immediately available ER MD   Physician(s) Burlene Arnt and Kinner   Medication changes reported     No   Fall or balance concerns reported    No   Warm-up and Cool-down Performed on first and last piece of equipment   Resistance Training Performed Yes   VAD Patient? No     Pain Assessment   Currently in Pain? No/denies   Multiple Pain Sites No         Goals Met:  Proper associated with RPD/PD & O2 Sat Independence with exercise equipment Exercise tolerated well Strength training completed today  Goals Unmet:  Not Applicable  Comments: Pt able to follow exercise prescription today without complaint.  Will continue to monitor for progression.    Dr. Emily Filbert is Medical Director for [place] and [facility].

## 2015-12-29 NOTE — Progress Notes (Signed)
Pulmonary Individual Treatment Plan  Patient Details  Name: [name] [name] MRN: [id number] Date of Birth: February 15, 1941 Referring Provider:   Flowsheet Row Pulmonary Rehab from 12/16/2015 in [facility] Cardiac and Pulmonary Rehab  Referring Provider  Simonne Maffucci MD      Initial Encounter Date:  Flowsheet Row Pulmonary Rehab from 12/16/2015 in [facility] Cardiac and Pulmonary Rehab  Date  12/16/15  Referring Provider  Simonne Maffucci MD      Visit Diagnosis: COPD, severe ([place])  Patient's Home Medications on Admission:  Current Outpatient Prescriptions:  .  albuterol (PROAIR HFA) 108 (90 BASE) MCG/ACT inhaler, Inhale 2 puffs into the lungs every 4 (four) hours as needed., Disp: 1 Inhaler, Rfl: 5 .  albuterol (PROVENTIL) (2.5 MG/3ML) 0.083% nebulizer solution, Take every 4-6 hours as needed for shortness of breath, Disp: 75 mL, Rfl: 5 .  arformoterol (BROVANA) 15 MCG/2ML NEBU, Take 2 mLs (15 mcg total) by nebulization 2 (two) times daily., Disp: 120 mL, Rfl: 11 .  aspirin EC 81 MG tablet, Take 1 tablet (81 mg total) by mouth every Monday, Wednesday, and Friday., Disp: , Rfl:  .  budesonide (PULMICORT) 0.5 MG/2ML nebulizer solution, Take 2 mLs (0.5 mg total) by nebulization 2 (two) times daily., Disp: 60 mL, Rfl: 12 .  buPROPion (WELLBUTRIN SR) 150 MG 12 hr tablet, TAKE ONE TABLET BY MOUTH TWICE DAILY, Disp: 60 tablet, Rfl: 11 .  CRANBERRY PO, Take 2 tablets by mouth every morning. , Disp: , Rfl:  .  ezetimibe (ZETIA) 10 MG tablet, Take 1 tablet (10 mg total) by mouth daily., Disp: 30 tablet, Rfl: 11 .  gabapentin (NEURONTIN) 100 MG capsule, Take 2 capsules (200 mg total) by mouth 2 (two) times daily., Disp: , Rfl:  .  losartan (COZAAR) 50 MG tablet, TAKE ONE TABLET BY MOUTH EVERY DAY, Disp: 30 tablet, Rfl: 11 .  metoprolol tartrate (LOPRESSOR) 25 MG tablet, TAKE ONE TABLET BY MOUTH TWICE DAILY, Disp: 60 tablet, Rfl: 11 .  MYRBETRIQ 25 MG TB24 tablet, TAKE ONE TABLET BY MOUTH EVERY DAY, Disp: 30  tablet, Rfl: 11 .  omeprazole (PRILOSEC) 40 MG capsule, TAKE ONE CAPSULE BY MOUTH EVERY DAY, Disp: 30 capsule, Rfl: 11 .  pravastatin (PRAVACHOL) 80 MG tablet, ONE TABLET BY MOUTH AT BEDTIME, Disp: 90 tablet, Rfl: 1 .  PROAIR HFA 108 (90 Base) MCG/ACT inhaler, INHALE 2 PUFFS EVERY 4 HOURS AS NEEDED, Disp: 8.5 g, Rfl: 5 .  psyllium (METAMUCIL) 58.6 % powder, Take 1 packet by mouth at bedtime. , Disp: , Rfl:  .  sertraline (ZOLOFT) 100 MG tablet, Take one tablet in the morning and one-half at bedtime, Disp: 45 tablet, Rfl: 11 .  SPIRIVA HANDIHALER 18 MCG inhalation capsule, INHALE CONTENTS OF 1 CAPSULE ONCE DAILY., Disp: 30 capsule, Rfl: 5 .  triamcinolone (NASACORT ALLERGY 24HR) 55 MCG/ACT AERO nasal inhaler, Place 2 sprays into the nose daily., Disp: , Rfl:  .  vitamin B-12 (CYANOCOBALAMIN) 1000 MCG tablet, Take 1,000 mcg by mouth daily., Disp: , Rfl:   Past Medical History: Past Medical History:  Diagnosis Date  . Chronic cystitis    recurrent UTI (4+ in last year), on suppressive doxy (uro Cope)  . Colon polyp   . 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 Depression with anxiety   . Diverticular disease 10/09/09   CT abd no sigmoid mass DDD L5/S1  . Dyslipidemia   . GERD (gastroesophageal reflux disease)   . Hematuria    h/o kidney  stones  . Hiatal hernia   . Hypertension   . Left scapholunate ligament tear 2014   found on imaging  . Lung cancer ([place]) 1987   Partial RLL Lobectomy.  . Osteoarthritis   . Osteopenia 08/2011   DEXA: L femur -1.6  . Peptic ulcer   . Spinal abscess ([place]) 2000   secondary to periodontal disease    Tobacco Use: History  Smoking Status  . Former Smoker  . Packs/day: 3.00  . Years: 34.00  . Types: Cigarettes  . Quit date: 05/29/1995  Smokeless Tobacco  . Never Used    Labs: Recent Review Flowsheet Data    Labs for ITP Cardiac and Pulmonary Rehab Latest Ref Rng & Units 09/06/2011 11/13/2012 02/13/2014 02/25/2015 09/02/2015   Cholestrol 0 - 200 mg/dL 228(H)  188 219(H) 204(H) 147   LDLCALC 0 - 99 mg/dL - 88 108(H) 100(H) 57   LDLDIRECT mg/dL 135.9 - - - -   HDL >39.00 mg/dL 65.50 64.60 71.30 76.90 67.90   Trlycerides 0.0 - 149.0 mg/dL 166.0(H) 175.0(H) 200.0(H) 132.0 113.0   Hemoglobin A1c 4.6 - 6.5 % - - 5.5 - -       ADL UCSD:     Pulmonary Assessment Scores    Row Name 12/16/15 1511         ADL UCSD   ADL Phase Entry     SOB Score total 74     Rest 0     Walk 0     Stairs 0     Bath 2     Dress 4     Shop 5        Pulmonary Function Assessment:     Pulmonary Function Assessment - 12/16/15 1511      Initial Spirometry Results   Comments Test Date: 12/16/2015      Exercise Target Goals:    Exercise Program Goal: Individual exercise prescription set with THRR, safety & activity barriers. Participant demonstrates ability to understand and report RPE using BORG scale, to self-measure pulse accurately, and to acknowledge the importance of the exercise prescription.  Exercise Prescription Goal: Starting with aerobic activity 30 plus minutes a day, 3 days per week for initial exercise prescription. Provide home exercise prescription and guidelines that participant acknowledges understanding prior to discharge.  Activity Barriers & Risk Stratification:     Activity Barriers & Cardiac Risk Stratification - 12/16/15 1507      Activity Barriers & Cardiac Risk Stratification   Activity Barriers Deconditioning;Shortness of Breath   Cardiac Risk Stratification Moderate      6 Minute Walk:     6 Minute Walk    Row Name 12/16/15 1544         6 Minute Walk   Phase Initial     Distance 1000 feet     Walk Time 6 minutes     # of Rest Breaks 0     MPH 1.89     METS 2.1     RPE 17     Perceived Dyspnea  2     VO2 Peak 7.35     Symptoms Yes (comment)     Comments fatigued     Resting HR 69 bpm     Resting BP 116/54     Max Ex. HR 91 bpm     Max Ex. BP 144/56     2 Minute Post BP 144/60       Interval HR    Baseline HR 69  1 Minute HR 80     2 Minute HR 85     3 Minute HR 84     4 Minute HR 89     5 Minute HR 89     6 Minute HR 91     2 Minute Post HR 82     Interval Heart Rate? Yes       Interval Oxygen   Interval Oxygen? Yes     Baseline Oxygen Saturation % 97 %     Baseline Liters of Oxygen 4 L     1 Minute Oxygen Saturation % 95 %     1 Minute Liters of Oxygen 4 L     2 Minute Oxygen Saturation % 94 %     2 Minute Liters of Oxygen 4 L     3 Minute Oxygen Saturation % 94 %     3 Minute Liters of Oxygen 4 L     4 Minute Oxygen Saturation % 95 %     4 Minute Liters of Oxygen 4 L     5 Minute Oxygen Saturation % 94 %     5 Minute Liters of Oxygen 4 L     6 Minute Oxygen Saturation % 96 %     6 Minute Liters of Oxygen 4 L     2 Minute Post Oxygen Saturation % 96 %     2 Minute Post Liters of Oxygen 4 L        Initial Exercise Prescription:     Initial Exercise Prescription - 12/16/15 1500      Date of Initial Exercise RX and Referring Provider   Date 12/16/15   Referring Provider Simonne Maffucci MD     Oxygen   Oxygen Continuous   Liters 4     Treadmill   MPH 1.4   Grade 0   Minutes 15   METs 2.07     NuStep   Level 1   Minutes 15   METs 2     Recumbant Elliptical   Level 1   RPM 50   Minutes 15   METs 2     Prescription Details   Frequency (times per week) 3   Duration Progress to 45 minutes of aerobic exercise without signs/symptoms of physical distress     Intensity   THRR 40-80% of Max Heartrate 99-131   Ratings of Perceived Exertion 11-13   Perceived Dyspnea 0-4     Progression   Progression Continue to progress workloads to maintain intensity without signs/symptoms of physical distress.     Resistance Training   Training Prescription Yes   Weight 2 lbs   Reps 10-12      Perform Capillary Blood Glucose checks as needed.  Exercise Prescription Changes:     Exercise Prescription Changes    Row Name 12/24/15 1400              Response to Exercise   Blood Pressure (Admit) 138/68       Blood Pressure (Exercise) 142/68       Blood Pressure (Exit) 122/68       Heart Rate (Admit) 67 bpm       Heart Rate (Exercise) 82 bpm       Heart Rate (Exit) 67 bpm       Oxygen Saturation (Admit) 97 %       Oxygen Saturation (Exercise) 94 %       Oxygen Saturation (Exit) 99 %  Rating of Perceived Exertion (Exercise) 13       Perceived Dyspnea (Exercise) 2       Symptoms none       Duration Progress to 45 minutes of aerobic exercise without signs/symptoms of physical distress       Intensity THRR unchanged         Progression   Progression Continue to progress workloads to maintain intensity without signs/symptoms of physical distress.       Average METs 2.1         Resistance Training   Training Prescription Yes       Weight 2 lbs       Reps 10-12         Interval Training   Interval Training No         Oxygen   Oxygen Continuous       Liters 4         Treadmill   MPH 1.6       Grade 0       Minutes 15       METs 2.23         NuStep   Level 4       Minutes 15       METs 1.8         REL-XR   Level 1       Minutes 15       METs 1.8          Exercise Comments:     Exercise Comments    Row Name 12/16/15 1548 12/24/15 1404         Exercise Comments Faelyn wants to improve her ability to walk to be able to go shopping again. First full day of exercise!  Patient was oriented to gym and equipment including functions, settings, policies, and procedures.  Patient's individual exercise prescription and treatment plan were reviewed.  All starting workloads were established based on the results of the 6 minute walk test done at initial orientation visit.  The plan for exercise progression was also introduced and progression will be customized based on patient's performance and goals.         Discharge Exercise Prescription (Final Exercise Prescription Changes):     Exercise Prescription Changes -  12/24/15 1400      Response to Exercise   Blood Pressure (Admit) 138/68   Blood Pressure (Exercise) 142/68   Blood Pressure (Exit) 122/68   Heart Rate (Admit) 67 bpm   Heart Rate (Exercise) 82 bpm   Heart Rate (Exit) 67 bpm   Oxygen Saturation (Admit) 97 %   Oxygen Saturation (Exercise) 94 %   Oxygen Saturation (Exit) 99 %   Rating of Perceived Exertion (Exercise) 13   Perceived Dyspnea (Exercise) 2   Symptoms none   Duration Progress to 45 minutes of aerobic exercise without signs/symptoms of physical distress   Intensity THRR unchanged     Progression   Progression Continue to progress workloads to maintain intensity without signs/symptoms of physical distress.   Average METs 2.1     Resistance Training   Training Prescription Yes   Weight 2 lbs   Reps 10-12     Interval Training   Interval Training No     Oxygen   Oxygen Continuous   Liters 4     Treadmill   MPH 1.6   Grade 0   Minutes 15   METs 2.23     NuStep   Level 4  Minutes 15   METs 1.8     REL-XR   Level 1   Minutes 15   METs 1.8       Nutrition:  Target Goals: Understanding of nutrition guidelines, daily intake of sodium '1500mg'$ , cholesterol '200mg'$ , calories 30% from fat and 7% or less from saturated fats, daily to have 5 or more servings of fruits and vegetables.  Biometrics:    Nutrition Therapy Plan and Nutrition Goals:     Nutrition Therapy & Goals - 12/16/15 1515      Nutrition Therapy   Diet Ms An prefers not to meet with the dietitian. They are residents at a retirement community and eat their lunch at their dining facility.      Nutrition Discharge: Rate Your Plate Scores:   Psychosocial: Target Goals: Acknowledge presence or absence of depression, maximize coping skills, provide positive support system. Participant is able to verbalize types and ability to use techniques and skills needed for reducing stress and depression.  Initial Review & Psychosocial  Screening:     Initial Psych Review & Screening - 12/16/15 [place]? Yes   Comments Ms Kivett has good support from her husband. She states She does not have depression, but medicates with Zoloft and Wellbutrin. Due to her shortness of breath, Ms Elenbaas misses certain activities she use to perform.     Barriers   Psychosocial barriers to participate in program The patient should benefit from training in stress management and relaxation.     Screening Interventions   Interventions Encouraged to exercise;Program counselor consult      Quality of Life Scores:     Quality of Life - 12/16/15 1524      Quality of Life Scores   Health/Function Pre 19.63 %   Socioeconomic Pre 20.33 %   Psych/Spiritual Pre 19.29 %   Family Pre 21 %   GLOBAL Pre 19.88 %      PHQ-9: Recent Review Flowsheet Data    Depression screen [facility] 2/9 12/16/2015 03/04/2015 02/18/2014 11/13/2012   Decreased Interest 2 0  0 1   Down, Depressed, Hopeless 1 1 0 1   PHQ - 2 Score 3 1 0 2   Altered sleeping 0 - - 0   Tired, decreased energy 2 - - 2   Change in appetite 0 - - 0   Feeling bad or failure about yourself  0 - - 1   Trouble concentrating 0 - - 0   Moving slowly or fidgety/restless 0 - - 0   Suicidal thoughts 1 - - 0   PHQ-9 Score 6 - - 5   Difficult doing work/chores Very difficult - - -      Psychosocial Evaluation and Intervention:   Psychosocial Re-Evaluation:  Education: Education Goals: Education classes will be provided on a weekly basis, covering required topics. Participant will state understanding/return demonstration of topics presented.  Learning Barriers/Preferences:     Learning Barriers/Preferences - 12/16/15 1507      Learning Barriers/Preferences   Learning Barriers None   Learning Preferences Group Instruction;Individual Instruction;Pictoral;Skilled Demonstration;Verbal Instruction;Video;Written Material      Education  Topics: Initial Evaluation Education: - Verbal, written and demonstration of respiratory meds, RPE/PD scales, oximetry and breathing techniques. Instruction on use of nebulizers and MDIs: cleaning and proper use, rinsing mouth with steroid doses and importance of monitoring MDI activations. Flowsheet Row Pulmonary Rehab from 12/26/2015 in [facility] Cardiac and Pulmonary Rehab  Date  12/16/15  Educator  LB  Instruction Review Code  2- meets goals/outcomes      General Nutrition Guidelines/Fats and Fiber: -Group instruction provided by verbal, written material, models and posters to present the general guidelines for heart healthy nutrition. Gives an explanation and review of dietary fats and fiber.   Controlling Sodium/Reading Food Labels: -Group verbal and written material supporting the discussion of sodium use in heart healthy nutrition. Review and explanation with models, verbal and written materials for utilization of the food label.   Exercise Physiology & Risk Factors: - Group verbal and written instruction with models to review the exercise physiology of the cardiovascular system and associated critical values. Details cardiovascular disease risk factors and the goals associated with each risk factor.   Aerobic Exercise & Resistance Training: - Gives group verbal and written discussion on the health impact of inactivity. On the components of aerobic and resistive training programs and the benefits of this training and how to safely progress through these programs.   Flexibility, Balance, General Exercise Guidelines: - Provides group verbal and written instruction on the benefits of flexibility and balance training programs. Provides general exercise guidelines with specific guidelines to those with heart or lung disease. Demonstration and skill practice provided.   Stress Management: - Provides group verbal and written instruction about the health risks of elevated stress, cause of  high stress, and healthy ways to reduce stress.   Depression: - Provides group verbal and written instruction on the correlation between heart/lung disease and depressed mood, treatment options, and the stigmas associated with seeking treatment.   Exercise & Equipment Safety: - Individual verbal instruction and demonstration of equipment use and safety with use of the equipment.   Infection Prevention: - Provides verbal and written material to individual with discussion of infection control including proper hand washing and proper equipment cleaning during exercise session. Flowsheet Row Pulmonary Rehab from 12/26/2015 in [facility] Cardiac and Pulmonary Rehab  Date  12/16/15  Educator  LB  Instruction Review Code  2- meets goals/outcomes      Falls Prevention: - Provides verbal and written material to individual with discussion of falls prevention and safety. Flowsheet Row Pulmonary Rehab from 12/26/2015 in [facility] Cardiac and Pulmonary Rehab  Date  12/16/15  Educator  LB  Instruction Review Code  2- meets goals/outcomes      Diabetes: - Individual verbal and written instruction to review signs/symptoms of diabetes, desired ranges of glucose level fasting, after meals and with exercise. Advice that pre and post exercise glucose checks will be done for 3 sessions at entry of program.   Chronic Lung Diseases: - Group verbal and written instruction to review new updates, new respiratory medications, new advancements in procedures and treatments. Provide informative websites and "800" numbers of self-education.   Lung Procedures: - Group verbal and written instruction to describe testing methods done to diagnose lung disease. Review the outcome of test results. Describe the treatment choices: Pulmonary Function Tests, ABGs and oximetry.   Energy Conservation: - Provide group verbal and written instruction for methods to conserve energy, plan and organize activities. Instruct on pacing  techniques, use of adaptive equipment and posture/positioning to relieve shortness of breath.   Triggers: - Group verbal and written instruction to review types of environmental controls: home humidity, furnaces, filters, dust mite/pet prevention, HEPA vacuums. To discuss weather changes, air quality and the benefits of nasal washing.   Exacerbations: - Group verbal and written instruction to provide: warning signs, infection  symptoms, calling MD promptly, preventive modes, and value of vaccinations. Review: effective airway clearance, coughing and/or vibration techniques. Create an Sports administrator.   Oxygen: - Individual and group verbal and written instruction on oxygen therapy. Includes supplement oxygen, available portable oxygen systems, continuous and intermittent flow rates, oxygen safety, concentrators, and Medicare reimbursement for oxygen. Flowsheet Row Pulmonary Rehab from 12/26/2015 in [facility] Cardiac and Pulmonary Rehab  Date  12/16/15  Educator  LB  Instruction Review Code  2- meets goals/outcomes      Respiratory Medications: - Group verbal and written instruction to review medications for lung disease. Drug class, frequency, complications, importance of spacers, rinsing mouth after steroid MDI's, and proper cleaning methods for nebulizers. Flowsheet Row Pulmonary Rehab from 12/26/2015 in [facility] Cardiac and Pulmonary Rehab  Date  12/16/15  Educator  LB  Instruction Review Code  2- meets goals/outcomes      AED/CPR: - Group verbal and written instruction with the use of models to demonstrate the basic use of the AED with the basic ABC's of resuscitation. Flowsheet Row Pulmonary Rehab from 12/26/2015 in [facility] Cardiac and Pulmonary Rehab  Date  12/26/15  Educator  CE  Instruction Review Code  2- meets goals/outcomes      Breathing Retraining: - Provides individuals verbal and written instruction on purpose, frequency, and proper technique of diaphragmatic breathing and  pursed-lipped breathing. Applies individual practice skills. Flowsheet Row Pulmonary Rehab from 12/26/2015 in [facility] Cardiac and Pulmonary Rehab  Date  12/24/15  Educator  LB  Instruction Review Code  2- meets goals/outcomes      Anatomy and Physiology of the Lungs: - Group verbal and written instruction with the use of models to provide basic lung anatomy and physiology related to function, structure and complications of lung disease.   Heart Failure: - Group verbal and written instruction on the basics of heart failure: signs/symptoms, treatments, explanation of ejection fraction, enlarged heart and cardiomyopathy.   Sleep Apnea: - Individual verbal and written instruction to review Obstructive Sleep Apnea. Review of risk factors, methods for diagnosing and types of masks and machines for OSA.   Anxiety: - Provides group, verbal and written instruction on the correlation between heart/lung disease and anxiety, treatment options, and management of anxiety.   Relaxation: - Provides group, verbal and written instruction about the benefits of relaxation for patients with heart/lung disease. Also provides patients with examples of relaxation techniques.   Knowledge Questionnaire Score:     Knowledge Questionnaire Score - 12/16/15 1508      Knowledge Questionnaire Score   Pre Score 8/10       Core Components/Risk Factors/Patient Goals at Admission:     Personal Goals and Risk Factors at Admission - 12/16/15 1517      Core Components/Risk Factors/Patient Goals on Admission   Sedentary Yes  Residents at [facility], so access to their gym facility   Intervention Provide advice, education, support and counseling about physical activity/exercise needs.;Develop an individualized exercise prescription for aerobic and resistive training based on initial evaluation findings, risk stratification, comorbidities and participant's personal goals.   Expected Outcomes Achievement of  increased cardiorespiratory fitness and enhanced flexibility, muscular endurance and strength shown through measurements of functional capacity and personal statement of participant.   Increase Strength and Stamina Yes   Intervention Provide advice, education, support and counseling about physical activity/exercise needs.;Develop an individualized exercise prescription for aerobic and resistive training based on initial evaluation findings, risk stratification, comorbidities and participant's personal goals.   Expected Outcomes  Achievement of increased cardiorespiratory fitness and enhanced flexibility, muscular endurance and strength shown through measurements of functional capacity and personal statement of participant.   Improve shortness of breath with ADL's Yes   Intervention Provide education, individualized exercise plan and daily activity instruction to help decrease symptoms of SOB with activities of daily living.   Expected Outcomes Short Term: Achieves a reduction of symptoms when performing activities of daily living.   Develop more efficient breathing techniques such as purse lipped breathing and diaphragmatic breathing; and practicing self-pacing with activity Yes   Intervention Provide education, demonstration and support about specific breathing techniuqes utilized for more efficient breathing. Include techniques such as pursed lipped breathing, diaphragmatic breathing and self-pacing activity.   Expected Outcomes Short Term: Participant will be able to demonstrate and use breathing techniques as needed throughout daily activities.   Increase knowledge of respiratory medications and ability to use respiratory devices properly  Yes  Uses SVN with Albuterol, Brovana, and Pulmicort; uses Spiriva; Albuterol MDI and spacer given. Oxygen 4-5l/m; portable concentrator   Intervention Provide education and demonstration as needed of appropriate use of medications, inhalers, and oxygen therapy.    Expected Outcomes Short Term: Achieves understanding of medications use. Understands that oxygen is a medication prescribed by physician. Demonstrates appropriate use of inhaler and oxygen therapy.   Personal Goal Other Yes   Personal Goal Able to go shopping again   Intervention Provide exercise prescription and education to increase functional capacity and improved shortness of breath   Expected Outcomes Able to gain strength and stamina to shopping.      Core Components/Risk Factors/Patient Goals Review:      Goals and Risk Factor Review    Row Name 12/24/15 1401             Core Components/Risk Factors/Patient Goals Review   Personal Goals Review Develop more efficient breathing techniques such as purse lipped breathing and diaphragmatic breathing and practicing self-pacing with activity.       Review Reivewed PLB with pt today.  Reviewed technique and how it can help with reducing shortness of breath.       Expected Outcomes Able to use PLB independently without queuing          Core Components/Risk Factors/Patient Goals at Discharge (Final Review):      Goals and Risk Factor Review - 12/24/15 1401      Core Components/Risk Factors/Patient Goals Review   Personal Goals Review Develop more efficient breathing techniques such as purse lipped breathing and diaphragmatic breathing and practicing self-pacing with activity.   Review Reivewed PLB with pt today.  Reviewed technique and how it can help with reducing shortness of breath.   Expected Outcomes Able to use PLB independently without queuing      ITP Comments:   Comments: 30 Day Review

## 2015-12-31 DIAGNOSIS — J449 Chronic obstructive pulmonary disease, unspecified: Secondary | ICD-10-CM

## 2015-12-31 DIAGNOSIS — J432 Centrilobular emphysema: Secondary | ICD-10-CM | POA: Diagnosis not present

## 2015-12-31 NOTE — Progress Notes (Signed)
Daily Session Note  Patient Details  Name: [name] [name] MRN: [id number] Date of Birth: November 24, 1940 Referring Provider:   Flowsheet Row Pulmonary Rehab from 12/16/2015 in [facility] Cardiac and Pulmonary Rehab  Referring Provider  Simonne Maffucci MD      Encounter Date: 12/31/2015  Check In:     Session Check In - 12/31/15 1247      Check-In   Location [facility]-Cardiac & Pulmonary Rehab   Staff Present Heath Lark, RN, BSN, CCRP;Laureen Owens Shark, BS, RRT, Respiratory Dareen Piano, BA, ACSM CEP, Exercise Physiologist   Supervising physician immediately available to respond to emergencies [facility] immediately available ER MD   Physician(s) Marcelene Butte and Jimmye Norman   Medication changes reported     No   Fall or balance concerns reported    No   Warm-up and Cool-down Performed on first and last piece of equipment   Resistance Training Performed Yes   VAD Patient? No     Pain Assessment   Currently in Pain? No/denies   Multiple Pain Sites No         Goals Met:  Proper associated with RPD/PD & O2 Sat Independence with exercise equipment Exercise tolerated well Strength training completed today  Goals Unmet:  Not Applicable  Comments: Pt able to follow exercise prescription today without complaint.  Will continue to monitor for progression.    Dr. Emily Filbert is Medical Director for [place] and [facility].

## 2016-01-01 ENCOUNTER — Encounter: Payer: Self-pay | Admitting: Urology

## 2016-01-01 ENCOUNTER — Ambulatory Visit (INDEPENDENT_AMBULATORY_CARE_PROVIDER_SITE_OTHER): Payer: Medicare Other | Admitting: Urology

## 2016-01-01 VITALS — BP 135/68 | HR 62 | Ht 61.0 in | Wt 154.9 lb

## 2016-01-01 DIAGNOSIS — N952 Postmenopausal atrophic vaginitis: Secondary | ICD-10-CM

## 2016-01-01 DIAGNOSIS — N3941 Urge incontinence: Secondary | ICD-10-CM | POA: Diagnosis not present

## 2016-01-01 DIAGNOSIS — N390 Urinary tract infection, site not specified: Secondary | ICD-10-CM | POA: Diagnosis not present

## 2016-01-01 LAB — BLADDER SCAN AMB NON-IMAGING: SCAN RESULT: 15

## 2016-01-01 MED ORDER — ESTRADIOL 0.1 MG/GM VA CREA: TOPICAL_CREAM | VAGINAL | 12 refills | Status: DC

## 2016-01-01 MED ORDER — ESTROGENS, CONJUGATED 0.625 MG/GM VA CREA: 1.0000 | TOPICAL_CREAM | Freq: Every day | VAGINAL | 12 refills | Status: DC

## 2016-01-01 NOTE — Progress Notes (Signed)
01/01/2016 12:02 PM   [name] [name] [birth year] [id number]  Referring provider: Ria Bush, MD [address] [place], [place] [address]  Chief Complaint  Patient presents with  . Recurrent UTI    New Patient referred by [facility]    HPI: Patient is a 75 -year-old Caucasian female who is referred to [place] by, Dr. Danise Mina, for recurrent urinary tract infections.  Patient states that she has had three urinary tract infections over the last month.  Her symptoms with a urinary tract infection consist of worsening urgency, frequency, and incontinence. She also experiences dysuria and intermittency during infections..  She denies gross hematuria, suprapubic pain, back pain, abdominal pain or flank pain.   She has not had any recent fevers, chills, nausea or vomiting.   She does have a history of GU surgery (bladder sling with Dr. Jacqlyn Larsen 2005).   She does not have a history of nephrolithiasis or GU trauma.   Reviewing her records,  she has had three infections.  Each positive for E. Coli.      She is not sexually active.    She is post menopausal.   She admits to diarrhea.    She does engage in good perineal hygiene. She does not take tub baths.   She has urge incontinence.  She is using incontinence pads.   She has not had any recent imaging studies.  Her PVR today is 15 mL.    PMH: Past Medical History:  Diagnosis Date  . Chronic cystitis    recurrent UTI (4+ in last year), on suppressive doxy (uro Cope)  . Colon polyp   . 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 Depression with anxiety   . Diverticular disease 10/09/09   CT abd no sigmoid mass DDD L5/S1  . Dyslipidemia   . GERD (gastroesophageal reflux disease)   . Hematuria    h/o kidney stones  . Hiatal hernia   . Hypertension   . Left scapholunate ligament tear 2014   found on imaging  . Lung cancer ([place]) 1987   Partial RLL Lobectomy.  . Osteoarthritis   . Osteopenia 08/2011   DEXA: L femur -1.6    . Peptic ulcer   . Spinal abscess ([place]) 2000   secondary to periodontal disease    Surgical History: Past Surgical History:  Procedure Laterality Date  . BLADDER SURGERY    . BREAST BIOPSY Left 03/99   neg  . CHOLECYSTECTOMY  02/10/09  . COLONOSCOPY WITH PROPOFOL N/A 04/30/2014   Procedure: COLONOSCOPY WITH PROPOFOL;  Surgeon: Irene Shipper, MD;  Location: [facility];  Service: Endoscopy;  Laterality: N/A;  . CYSTOSCOPY  02/11/07 and 09/25/08   Dr. Jacqlyn Larsen, normal  . DEXA  08/2011   T femur -1.6, T spine -0.6  . [place] pneumonia sepsis due to gum surgery  . REPLACEMENT TOTAL KNEE  09/2009   Right  . TOTAL ABDOMINAL HYSTERECTOMY  07/18/01   benign reason, BSO, vag repair secondary prolapse  . TUBAL LIGATION     bilateral    Home Medications:    Medication List       Accurate as of 01/01/16 12:02 PM. Always use your most recent med list.          albuterol 108 (90 Base) MCG/ACT inhaler Commonly known as:  PROAIR HFA Inhale 2 puffs into the lungs every 4 (four) hours as needed.   albuterol (2.5 MG/3ML)  0.083% nebulizer solution Commonly known as:  PROVENTIL Take every 4-6 hours as needed for shortness of breath   arformoterol 15 MCG/2ML Nebu Commonly known as:  BROVANA Take 2 mLs (15 mcg total) by nebulization 2 (two) times daily.   aspirin EC 81 MG tablet Take 1 tablet (81 mg total) by mouth every Monday, Wednesday, and Friday.   benzonatate 100 MG capsule Commonly known as:  TESSALON Take by mouth 3 (three) times daily as needed for cough.   budesonide 0.5 MG/2ML nebulizer solution Commonly known as:  PULMICORT Take 2 mLs (0.5 mg total) by nebulization 2 (two) times daily.   buPROPion 150 MG 12 hr tablet Commonly known as:  WELLBUTRIN SR TAKE ONE TABLET BY MOUTH TWICE DAILY   CRANBERRY PO Take 2 tablets by mouth every morning.   guaifenesin 400 MG Tabs tablet Commonly known as:  HUMIBID E Take 400 mg by mouth every 4  (four) hours.   HYDROcodone-acetaminophen 5-325 MG tablet Commonly known as:  NORCO/VICODIN Take 1 tablet by mouth every 6 (six) hours as needed for moderate pain.   levofloxacin 500 MG tablet Commonly known as:  LEVAQUIN   losartan 50 MG tablet Commonly known as:  COZAAR TAKE ONE TABLET BY MOUTH EVERY DAY   metoprolol tartrate 25 MG tablet Commonly known as:  LOPRESSOR TAKE ONE TABLET BY MOUTH TWICE DAILY   MYRBETRIQ 25 MG Tb24 tablet Generic drug:  mirabegron ER TAKE ONE TABLET BY MOUTH EVERY DAY   omeprazole 40 MG capsule Commonly known as:  PRILOSEC TAKE ONE CAPSULE BY MOUTH EVERY DAY   pravastatin 80 MG tablet Commonly known as:  PRAVACHOL ONE TABLET BY MOUTH AT BEDTIME   predniSONE 10 MG tablet Commonly known as:  DELTASONE   psyllium 58.6 % powder Commonly known as:  METAMUCIL Take 1 packet by mouth at bedtime.   sertraline 100 MG tablet Commonly known as:  ZOLOFT Take one tablet in the morning and one-half at bedtime   SPIRIVA HANDIHALER 18 MCG inhalation capsule Generic drug:  tiotropium INHALE CONTENTS OF 1 CAPSULE ONCE DAILY.   vitamin B-12 1000 MCG tablet Commonly known as:  CYANOCOBALAMIN Take 1,000 mcg by mouth daily.       Allergies:  Allergies  Allergen Reactions  . Keflex [Cephalexin] Shortness Of Breath  . Penicillins     REACTION: hives    Family History: Family History  Problem Relation Age of Onset  . Pancreatitis Mother   . Heart disease Father   . Hypertension Father   . Emphysema Father   . Arthritis Brother   . Hypertension Brother   . Cancer Maternal Aunt     ovarian cancer  . Kidney disease Neg Hx     Social History:  reports that she quit smoking about 20 years ago. Her smoking use included Cigarettes. She has a 102.00 pack-year smoking history. She has never used smokeless tobacco. She reports that she drinks alcohol. She reports that she does not use drugs.  ROS: UROLOGY Frequent Urination?: Yes Hard to  postpone urination?: Yes Burning/pain with urination?: No Get up at night to urinate?: Yes Leakage of urine?: Yes Urine stream starts and stops?: No Trouble starting stream?: No Do you have to strain to urinate?: No Blood in urine?: Yes Urinary tract infection?: Yes Sexually transmitted disease?: No Injury to kidneys or bladder?: No Painful intercourse?: Yes Weak stream?: No Currently pregnant?: No Vaginal bleeding?: No Last menstrual period?: n  Gastrointestinal Nausea?: No Vomiting?: No Indigestion/heartburn?: Yes  Diarrhea?: Yes Constipation?: No  Constitutional Fever: No Night sweats?: No Weight loss?: No Fatigue?: Yes  Skin Skin rash/lesions?: Yes Itching?: Yes  Eyes Blurred vision?: No Double vision?: No  Ears/Nose/Throat Sore throat?: Yes Sinus problems?: Yes  Hematologic/Lymphatic Swollen glands?: No Easy bruising?: Yes  Cardiovascular Leg swelling?: No Chest pain?: No  Respiratory Cough?: Yes Shortness of breath?: Yes  Endocrine Excessive thirst?: No  Musculoskeletal Back pain?: Yes Joint pain?: Yes  Neurological Headaches?: No Dizziness?: No  Psychologic Depression?: Yes Anxiety?: Yes  Physical Exam: BP 135/68   Pulse 62   Ht '5\' 1"'$  (1.549 m)   Wt 154 lb 14.4 oz (70.3 kg)   BMI 29.27 kg/m   Constitutional: Well nourished. Alert and oriented, No acute distress. HEENT: [place] AT, moist mucus membranes. Trachea midline, no masses. Cardiovascular: No clubbing, cyanosis, or edema. Respiratory: Normal respiratory effort, no increased work of breathing. GI: Abdomen is soft, non tender, non distended, no abdominal masses. Liver and spleen not palpable.  No hernias appreciated.  Stool sample for occult testing is not indicated.   GU: No CVA tenderness.  No bladder fullness or masses.    Atrophic external genitalia, normal pubic hair distribution, no lesions.  Normal urethral meatus, no lesions, no prolapse, no discharge.   No urethral  masses, tenderness and/or tenderness. No bladder fullness, tenderness or masses. Pale vagina mucosa, poor estrogen effect, no discharge, no lesions, good pelvic support, no cystocele or rectocele noted.   Cervix and uterus are surgically absent.  No pelvic masses or tenderness noted.  Anus and perineum are without rashes or lesions.    Skin: No rashes, bruises or suspicious lesions. Lymph: No cervical or inguinal adenopathy. Neurologic: Grossly intact, no focal deficits, moving all 4 extremities. Psychiatric: Normal mood and affect.  Laboratory Data: Lab Results  Component Value Date   WBC 6.4 02/25/2015   HGB 13.7 02/25/2015   HCT 41.3 02/25/2015   MCV 92.2 02/25/2015   PLT 147.0 (L) 02/25/2015    Lab Results  Component Value Date   CREATININE 0.71 09/02/2015     Lab Results  Component Value Date   HGBA1C 5.5 02/13/2014    Lab Results  Component Value Date   TSH 1.43 02/25/2015       Component Value Date/Time   CHOL 147 09/02/2015 1153   HDL 67.90 09/02/2015 1153   CHOLHDL 2 09/02/2015 1153   VLDL 22.6 09/02/2015 1153   LDLCALC 57 09/02/2015 1153    Lab Results  Component Value Date   AST 17 09/02/2015   Lab Results  Component Value Date   ALT 12 09/02/2015     Pertinent Imaging: Results for CHAELI, JUDY (MRN [id number]) as of 01/05/2016 22:31  Ref. Range 01/01/2016 11:34  Scan Result Unknown 15    Assessment & Plan:    1. Recurrent UTI  - Patient is instructed to increase her water intake until the urine is pale yellow or clear.  I have advised her to take probiotics (yogurt, oral pills or vaginal suppositories), take cranberry pills or drink the juice and use the estrogen cream.  She is to take Vitamin C 1,000 mg daily to acidify the urine.  She should also avoid soaking in tubs and wipe front to back after urinating.    Because of her history of recurrent UTIs, I have asked the patient to contact our office if she should experience symptoms of  urinary tract infection so that we can CATH her for an urine specimen  for urinalysis and culture. This is to prevent a skin contaminant from showing up in the urine culture.  If she should have her symptoms after hours or cannot get to our office, she should notify her other providers that she needs a catheterized specimen for UA and culture.   I reviewed the symptoms of a urinary tract infection, such as a worsening of urinary urgency and frequency, dysuria, which is painful urination and not the pain of urine hitting sensitive perineal skin, hematuria, foul-smelling urine, suprapubic pain or mental status changes. Fevers, chills, nausea and or vomiting can also be signs of a possible UTI.  Positive urinalyses and positive urine cultures that are not associated with urinary symptoms should not be treated with antibiotics.    I explained to the patient that being exposed to unnecessary antibiotics can put her at risk for increasing resistance of the bacteria to antibiotics, C. difficile and the side effects of the antibiotics.    We will also obtain a RUS and KUB to rule out GU abnormalities.    2. Vaginal atrophy  - I explained to the patient that when women go through menopause and her estrogen levels are severely diminished, the normal vaginal flora will change.  This is due to an increase of the vaginal canal's pH. Because of this, the vaginal canal may be colonized by bacteria from the rectum instead of the protective lactobacillus.  This accompanied by the loss of the mucus barrier with vaginal atrophy is a cause of recurrent urinary tract infections.  In some studies, it has been demonstrated that patients can experience a reduction in recurrent urinary tract infections to one a year.   Patient was given a sample of vaginal estrogen cream (Estrace) and instructed to apply 0.'5mg'$  (pea-sized amount)  just inside the vaginal introitus with a finger-tip every night for two weeks and then Monday,  Wednesday and Friday nights.  I explained to the patient that vaginally administered estrogen, which causes only a slight increase in the blood estrogen levels, have fewer contraindications and adverse systemic effects that oral HT.  I have also given prescriptions for the Estrace cream and Premarin cream, so that the patient may carry them to the pharmacy to see which one of the branded creams would be most economical for her.  She will return in 3 weeks for symptom recheck, exam and report if she was available to require the vaginal cream by prescription.  3. Urge incontinence  - increase the Myrbetriq to 50 mg    - RTC in 3 weeks for PVR and symptom recheck     - BLADDER SCAN AMB NON-IMAGING   Return in about 3 weeks (around 01/22/2016) for RUS report, KUB report and exam.  These notes generated with voice recognition software. I apologize for typographical errors.  Zara Council, [facility] [address], [place], [place] [address] [phone]

## 2016-01-01 NOTE — Patient Instructions (Signed)
I have given you two prescriptions for a vaginal estrogen cream.  Estrace and Premarin.  Please take these to your pharmacy and see which one your insurance covers.  If both are too expensive, please call the office at [phone] for an alternative.  You are given a sample of vaginal estrogen cream (tEstrace) and instructed to apply 0.'5mg'$  (pea-sized amount)  just inside the vaginal introitus with a finger-tip every night for two weeks.

## 2016-01-02 ENCOUNTER — Encounter: Payer: Medicare Other | Admitting: *Deleted

## 2016-01-02 DIAGNOSIS — J449 Chronic obstructive pulmonary disease, unspecified: Secondary | ICD-10-CM

## 2016-01-02 DIAGNOSIS — J432 Centrilobular emphysema: Secondary | ICD-10-CM | POA: Diagnosis not present

## 2016-01-02 NOTE — Progress Notes (Signed)
Daily Session Note  Patient Details  Name: [name] [name] MRN: [id number] Date of Birth: [birth year] Referring Provider:   Flowsheet Row Pulmonary Rehab from 12/16/2015 in [facility] Cardiac and Pulmonary Rehab  Referring Provider  Simonne Maffucci MD      Encounter Date: 01/02/2016  Check In:     Session Check In - 01/02/16 1121      Check-In   Staff Present Heath Lark, RN, BSN, CCRP;Laureen Owens Shark, BS, RRT, Respiratory Therapist;Marquail Bradwell, RN, BSN   Supervising physician immediately available to respond to emergencies [facility] immediately available ER MD   Physician(s) Dr. Jacqualine Code and Dr. Corky Downs   Medication changes reported     No   Fall or balance concerns reported    No   Warm-up and Cool-down Performed on first and last piece of equipment   Resistance Training Performed Yes   VAD Patient? No     Pain Assessment   Currently in Pain? No/denies         Goals Met:  Proper associated with RPD/PD & O2 Sat Exercise tolerated well Strength training completed today  Goals Unmet:  Not Applicable  Comments: Doing well with exercise Progression   Dr. Emily Filbert is Medical Director for [place] and [facility].

## 2016-01-05 ENCOUNTER — Encounter: Payer: Medicare Other | Admitting: *Deleted

## 2016-01-05 DIAGNOSIS — J449 Chronic obstructive pulmonary disease, unspecified: Secondary | ICD-10-CM

## 2016-01-05 DIAGNOSIS — J432 Centrilobular emphysema: Secondary | ICD-10-CM | POA: Diagnosis not present

## 2016-01-05 NOTE — Progress Notes (Signed)
Daily Session Note  Patient Details  Name: [name] [name] MRN: [id number] Date of Birth: 1941/02/24 Referring Provider:   Flowsheet Row Pulmonary Rehab from 12/16/2015 in [facility] Cardiac and Pulmonary Rehab  Referring Provider  Simonne Maffucci MD      Encounter Date: 01/05/2016  Check In:     Session Check In - 01/05/16 1029      Check-In   Location [facility]-Cardiac & Pulmonary Rehab   Staff Present Carson Myrtle, BS, RRT, Respiratory Bertis Ruddy, BS, ACSM CEP, Exercise Physiologist;Rebecca Brayton El, DPT, CEEA   Supervising physician immediately available to respond to emergencies [facility] immediately available ER MD   Physician(s) Dr. Bayard Beaver and Dr. Marcelene Butte   Medication changes reported     No   Fall or balance concerns reported    No   Warm-up and Cool-down Performed on first and last piece of equipment   Resistance Training Performed Yes   VAD Patient? No     Pain Assessment   Currently in Pain? No/denies   Multiple Pain Sites No         Goals Met:  Proper associated with RPD/PD & O2 Sat Independence with exercise equipment Exercise tolerated well Strength training completed today  Goals Unmet:  Not Applicable  Comments: Pt able to follow exercise prescription today without complaint.  Will continue to monitor for progression.    Dr. Emily Filbert is Medical Director for [place] and [facility].

## 2016-01-07 ENCOUNTER — Encounter: Payer: Medicare Other | Admitting: *Deleted

## 2016-01-07 DIAGNOSIS — J432 Centrilobular emphysema: Secondary | ICD-10-CM | POA: Diagnosis not present

## 2016-01-07 DIAGNOSIS — J449 Chronic obstructive pulmonary disease, unspecified: Secondary | ICD-10-CM

## 2016-01-07 NOTE — Progress Notes (Signed)
Daily Session Note  Patient Details  Name: NIKOLA BLACKSTON MRN: [id number] Date of Birth: 08-May-1941 Referring Provider:   Flowsheet Row Pulmonary Rehab from 12/16/2015 in [facility] Cardiac and Pulmonary Rehab  Referring Provider  Simonne Maffucci MD      Encounter Date: 01/07/2016  Check In:     Session Check In - 01/07/16 1013      Check-In   Location [facility]-Cardiac & Pulmonary Rehab   Staff Present Alberteen Sam, MA, ACSM RCEP, Exercise Physiologist;Amanda Oletta Darter, BA, ACSM CEP, Exercise Physiologist;Laureen Owens Shark, BS, RRT, Respiratory Therapist   Supervising physician immediately available to respond to emergencies [facility] immediately available ER MD   Physician(s) Drs. Edd Fabian and McShane   Medication changes reported     No   Fall or balance concerns reported    No   Warm-up and Cool-down Performed as group-led Location manager Performed Yes   VAD Patient? No     Pain Assessment   Currently in Pain? No/denies   Multiple Pain Sites No           Exercise Prescription Changes - 01/06/16 1400      Exercise Review   Progression Yes     Response to Exercise   Blood Pressure (Admit) 142/68   Blood Pressure (Exercise) 148/70   Blood Pressure (Exit) 110/62   Heart Rate (Admit) 64 bpm   Heart Rate (Exercise) 83 bpm   Heart Rate (Exit) 70 bpm   Oxygen Saturation (Admit) 97 %   Oxygen Saturation (Exercise) 96 %   Oxygen Saturation (Exit) 97 %   Rating of Perceived Exertion (Exercise) 13   Perceived Dyspnea (Exercise) 4   Symptoms none   Duration Progress to 45 minutes of aerobic exercise without signs/symptoms of physical distress   Intensity THRR unchanged     Progression   Progression Continue to progress workloads to maintain intensity without signs/symptoms of physical distress.   Average METs 2.64     Resistance Training   Training Prescription Yes   Weight 2 lbs   Reps 10-15     Interval Training   Interval Training No     Oxygen   Oxygen Continuous   Liters 4     Treadmill   MPH 1.7   Grade 0.5   Minutes 15   METs 2.3     NuStep   Level 2   Minutes 15   METs 2     REL-XR   Level 2   Minutes 15   METs 3.4      Goals Met:  Proper associated with RPD/PD & O2 Sat Independence with exercise equipment Using PLB without cueing & demonstrates good technique Exercise tolerated well Strength training completed today  Goals Unmet:  Not Applicable  Comments: Pt able to follow exercise prescription today without complaint.  Will continue to monitor for progression.  Reviewed home exercise with pt today.  Pt plans to walk and go to complex gym for exercise.  Reviewed THR, pulse, RPE, sign and symptoms, O2 use, and when to call 911 or MD.  Also discussed weather considerations and indoor options.  Pt voiced understanding.   Dr. Emily Filbert is Medical Director for [place] and [facility].

## 2016-01-09 DIAGNOSIS — J432 Centrilobular emphysema: Secondary | ICD-10-CM | POA: Diagnosis not present

## 2016-01-09 NOTE — Progress Notes (Signed)
Daily Session Note  Patient Details  Name: NIKAYA NASBY MRN: [id number] Date of Birth: 03-04-1941 Referring Provider:   Flowsheet Row Pulmonary Rehab from 12/16/2015 in [facility] Cardiac and Pulmonary Rehab  Referring Provider  Simonne Maffucci MD      Encounter Date: 01/09/2016  Check In:     Session Check In - 01/09/16 1053      Check-In   Location [facility]-Cardiac & Pulmonary Rehab   Staff Present Gerlene Burdock, RN, BSN;Stacey Blanch Media, RRT, RCP, Respiratory Dareen Piano, BA, ACSM CEP, Exercise Physiologist   Supervising physician immediately available to respond to emergencies [facility] immediately available ER MD   Physician(s) Alfred Levins and Jimmye Norman   Medication changes reported     No   Fall or balance concerns reported    No   Warm-up and Cool-down Performed as group-led Location manager Performed Yes   VAD Patient? No     Pain Assessment   Currently in Pain? No/denies         Goals Met:  Proper associated with RPD/PD & O2 Sat Independence with exercise equipment Exercise tolerated well Strength training completed today  Goals Unmet:  Not Applicable  Comments: Pt able to follow exercise prescription today without complaint.  Will continue to monitor for progression.    Dr. Emily Filbert is Medical Director for [place] and [facility].

## 2016-01-12 ENCOUNTER — Encounter: Payer: Medicare Other | Admitting: *Deleted

## 2016-01-12 DIAGNOSIS — J449 Chronic obstructive pulmonary disease, unspecified: Secondary | ICD-10-CM

## 2016-01-12 DIAGNOSIS — J432 Centrilobular emphysema: Secondary | ICD-10-CM | POA: Diagnosis not present

## 2016-01-12 NOTE — Progress Notes (Signed)
Daily Session Note  Patient Details  Name: [name] [name] MRN: [id number] Date of Birth: 12/21/1940 Referring Provider:   Flowsheet Row Pulmonary Rehab from 12/16/2015 in [facility] Cardiac and Pulmonary Rehab  Referring Provider  Simonne Maffucci MD      Encounter Date: 01/12/2016  Check In:     Session Check In - 01/12/16 1104      Check-In   Location [facility]-Card[facility] & Pulmonary Rehab   Staff Present Carson Myrtle, BS, RRT, Respiratory Therapist;Saidy Ormand Amedeo Plenty, BS, ACSM CEP, Exercise Physiologist;Jessica Luan Pulling, MA, ACSM RCEP, Exercise Physiologist   Supervising physician immediately available to respond to emergencies [facility] immediately available ER MD   Physician(s) Alfred Levins and Jimmye Norman    Medication changes reported     No   Fall or balance concerns reported    No   Warm-up and Cool-down Performed on first and last piece of equipment   Resistance Training Performed Yes   VAD Patient? No     Pain Assessment   Currently in Pain? No/denies   Multiple Pain Sites No         Goals Met:  Proper associated with RPD/PD & O2 Sat Independence with exercise equipment Exercise tolerated well Personal goals reviewed Strength training completed today  Goals Unmet:  Not Applicable  Comments: Pt able to follow exercise prescription today without complaint.  Will continue to monitor for progression.  [name] lost her balance during stretching exercises and did fall. She fell back on her buttocks and her head hit several strands of soft resistive bands and bruised her right forarm.  She reported that she felt ok. Safety report will be filed    Dr. Emily Filbert is Medical Director for [place] and [facility].

## 2016-01-13 ENCOUNTER — Ambulatory Visit
Admission: RE | Admit: 2016-01-13 | Discharge: 2016-01-13 | Disposition: A | Discharge status: Home / Self Care | Payer: Medicare Other | Source: Ambulatory Visit | Attending: Urology | Admitting: Urology

## 2016-01-13 DIAGNOSIS — N390 Urinary tract infection, site not specified: Secondary | ICD-10-CM

## 2016-01-13 DIAGNOSIS — R109 Unspecified abdominal pain: Secondary | ICD-10-CM | POA: Insufficient documentation

## 2016-01-13 DIAGNOSIS — R102 Pelvic and perineal pain: Secondary | ICD-10-CM | POA: Diagnosis not present

## 2016-01-14 DIAGNOSIS — R0902 Hypoxemia: Secondary | ICD-10-CM | POA: Diagnosis not present

## 2016-01-14 DIAGNOSIS — J432 Centrilobular emphysema: Secondary | ICD-10-CM | POA: Diagnosis not present

## 2016-01-14 DIAGNOSIS — J309 Allergic rhinitis, unspecified: Secondary | ICD-10-CM | POA: Diagnosis not present

## 2016-01-14 DIAGNOSIS — J449 Chronic obstructive pulmonary disease, unspecified: Secondary | ICD-10-CM

## 2016-01-14 NOTE — Progress Notes (Signed)
Daily Session Note  Patient Details  Name: XIARA KNISLEY MRN: [id number] Date of Birth: 12-05-1940 Referring Provider:   Flowsheet Row Pulmonary Rehab from 12/16/2015 in [facility] Cardiac and Pulmonary Rehab  Referring Provider  Simonne Maffucci MD      Encounter Date: 01/14/2016  Check In:     Session Check In - 01/14/16 1305      Check-In   Location [facility]-Cardiac & Pulmonary Rehab   Staff Present Nyoka Cowden, RN, BSN, [place], BS, RRT, Respiratory Dareen Piano, BA, ACSM CEP, Exercise Physiologist   Supervising physician immediately available to respond to emergencies [facility] immediately available ER MD   Physician(s) Alfred Levins and Jimmye Norman   Medication changes reported     No   Fall or balance concerns reported    No   Warm-up and Cool-down Performed as group-led instruction   Resistance Training Performed Yes   VAD Patient? No     Pain Assessment   Currently in Pain? No/denies         Goals Met:  Proper associated with RPD/PD & O2 Sat Independence with exercise equipment Exercise tolerated well Strength training completed today  Goals Unmet:  Not Applicable  Comments: Pt able to follow exercise prescription today without complaint.  Will continue to monitor for progression.    Dr. Emily Filbert is Medical Director for [place] and [facility].

## 2016-01-16 ENCOUNTER — Encounter: Payer: Medicare Other | Admitting: *Deleted

## 2016-01-16 DIAGNOSIS — J432 Centrilobular emphysema: Secondary | ICD-10-CM | POA: Diagnosis not present

## 2016-01-16 DIAGNOSIS — J449 Chronic obstructive pulmonary disease, unspecified: Secondary | ICD-10-CM

## 2016-01-16 NOTE — Progress Notes (Signed)
Daily Session Note  Patient Details  Name: CARLICIA LEAVENS MRN: [id number] Date of Birth: 02-23-1941 Referring Provider:   Flowsheet Row Pulmonary Rehab from 12/16/2015 in [facility] Cardiac and Pulmonary Rehab  Referring Provider  Simonne Maffucci MD      Encounter Date: 01/16/2016  Check In:     Session Check In - 01/16/16 1205      Check-In   Staff Present Heath Lark, RN, BSN, CCRP;Mary Kellie Shropshire, RN, BSN, Cathlean Cower, RRT, RCP, Respiratory Therapist   Supervising physician immediately available to respond to emergencies [facility] immediately available ER MD   Physician(s) Drs: Clearnce Hasten and Archie Balboa   Medication changes reported     No   Fall or balance concerns reported    No   Warm-up and Cool-down Performed as group-led instruction   Resistance Training Performed Yes   VAD Patient? No     Pain Assessment   Currently in Pain? No/denies         Goals Met:  Proper associated with RPD/PD & O2 Sat Independence with exercise equipment Exercise tolerated well Strength training completed today  Goals Unmet:  Not Applicable  Comments: Doing well with exercise prescription progression.    Dr. Emily Filbert is Medical Director for [name] and [facility].

## 2016-01-19 ENCOUNTER — Encounter: Payer: Medicare Other | Admitting: *Deleted

## 2016-01-19 DIAGNOSIS — J432 Centrilobular emphysema: Secondary | ICD-10-CM | POA: Diagnosis not present

## 2016-01-19 DIAGNOSIS — J449 Chronic obstructive pulmonary disease, unspecified: Secondary | ICD-10-CM

## 2016-01-19 NOTE — Progress Notes (Signed)
Daily Session Note  Patient Details  Name: [name] [name] MRN: [id number] Date of Birth: 07-21-1940 Referring Provider:   Flowsheet Row Pulmonary Rehab from 12/16/2015 in [facility] Cardiac and Pulmonary Rehab  Referring Provider  Simonne Maffucci MD      Encounter Date: 01/19/2016  Check In:     Session Check In - 01/19/16 1016      Check-In   Location [facility]-Cardiac & Pulmonary Rehab   Staff Present Alberteen Sam, MA, ACSM RCEP, Exercise Physiologist;Kelly Amedeo Plenty, BS, ACSM CEP, Exercise Physiologist;Laureen Owens Shark, BS, RRT, Respiratory Therapist   Supervising physician immediately available to respond to emergencies [facility] immediately available ER MD   Physician(s) Drs. Edd Fabian and Yao   Medication changes reported     No   Fall or balance concerns reported    No   Warm-up and Cool-down Performed as group-led Location manager Performed Yes   VAD Patient? No     Pain Assessment   Currently in Pain? No/denies   Multiple Pain Sites No         Goals Met:  Proper associated with RPD/PD & O2 Sat Independence with exercise equipment Using PLB without cueing & demonstrates good technique Exercise tolerated well Strength training completed today  Goals Unmet:  Not Applicable  Comments: Pt able to follow exercise prescription today without complaint.  Will continue to monitor for progression.    Dr. Emily Filbert is Medical Director for [place] and [facility].

## 2016-01-21 ENCOUNTER — Encounter: Payer: Self-pay | Admitting: Pulmonary Disease

## 2016-01-21 ENCOUNTER — Encounter: Payer: Medicare Other | Admitting: *Deleted

## 2016-01-21 ENCOUNTER — Ambulatory Visit (INDEPENDENT_AMBULATORY_CARE_PROVIDER_SITE_OTHER): Payer: Medicare Other | Admitting: Pulmonary Disease

## 2016-01-21 DIAGNOSIS — J432 Centrilobular emphysema: Secondary | ICD-10-CM | POA: Diagnosis not present

## 2016-01-21 DIAGNOSIS — J449 Chronic obstructive pulmonary disease, unspecified: Secondary | ICD-10-CM

## 2016-01-21 DIAGNOSIS — Z23 Encounter for immunization: Secondary | ICD-10-CM

## 2016-01-21 DIAGNOSIS — J9611 Chronic respiratory failure with hypoxia: Secondary | ICD-10-CM

## 2016-01-21 NOTE — Patient Instructions (Signed)
Keep taking her medicines as you're doing Use 3 L of oxygen when at rest, 4 L on exertion Follow-up 4 months

## 2016-01-21 NOTE — Progress Notes (Signed)
Daily Session Note  Patient Details  Name: [name] [name] MRN: [id number] Date of Birth: 1941-05-06 Referring Provider:   Flowsheet Row Pulmonary Rehab from 12/16/2015 in [facility] Cardiac and Pulmonary Rehab  Referring Provider  Simonne Maffucci MD      Encounter Date: 01/21/2016  Check In:     Session Check In - 01/21/16 1436      Check-In   Location [facility]-Cardiac & Pulmonary Rehab   Staff Present Nyoka Cowden, RN, BSN, Kela Millin, BA, ACSM CEP, Exercise Physiologist;Laureen Janell Quiet, RRT, Respiratory Therapist   Supervising physician immediately available to respond to emergencies [facility] immediately available ER MD   Physician(s) Drs. Karma Greaser and Alfred Levins   Medication changes reported     No   Fall or balance concerns reported    No   Warm-up and Cool-down Performed as group-led Location manager Performed Yes   VAD Patient? No     Pain Assessment   Currently in Pain? No/denies   Multiple Pain Sites No         Goals Met:  Proper associated with RPD/PD & O2 Sat Independence with exercise equipment Using PLB without cueing & demonstrates good technique Exercise tolerated well Strength training completed today  Goals Unmet:  Not Applicable  Comments: Pt able to follow exercise prescription today without complaint.  Will continue to monitor for progression.   Dr. Emily Filbert is Medical Director for [place] and [facility].

## 2016-01-21 NOTE — Assessment & Plan Note (Signed)
This has been a stable interval for [name] [name]. She still suffers from breathlessness but it's improved a bit since starting nebulized therapy.  She has been benefiting from pulmonary rehabilitation.  Plan: Continue Brovana, Pulmicort, Spiriva Continue to participate in pulmonary rehabilitation Flu shot today Follow-up 4 months

## 2016-01-21 NOTE — Assessment & Plan Note (Signed)
Continue 3 L of oxygen at rest, 4 L with exertion

## 2016-01-21 NOTE — Progress Notes (Signed)
Subjective:    [name], female    DOB: 1940/10/08, 75 y.o.   MRN: [id number]  Synopsis: [name] [name] has gold stage IV COPD and has followed with Dr. Halford Chessman in [place] since around 2007. [name] [name] uses 4 L of oxygen with exertion and 3 L at rest. [name] [name] participates in pulmonary rehabilitation at [facility]. Because [name] [name] lives in [place] [name] [name] switched her care to Dr. Lake Bells in may of 2014. [name] [name] had a HRCT in 2016 that showed emphysema but no ILD.  HPI  Chief Complaint  Patient presents with  . Follow-up    pt c/o increased SOB, stomach pain with large meals.     Seva has been particpating in pulmonary rehab and has done really well with it. NO flare ups since the last visit.  [name] [name] is still using her inhaled medicines regularly and Spiriva and [name] [name] says they help well.  [name] [name] uses her albuterol rescue inhaler multiple times per day, no night time use.  [name] [name] uses it just for shortness of breath. No leg swelling.  No cardiology visits since the last visit.  Past Medical History:  Diagnosis Date  . Chronic cystitis    recurrent UTI (4+ in last year), on suppressive doxy (uro [place]ope)  . Colon polyp   . 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 Depression with anxiety   . Diverticular disease 10/09/09   CT abd no sigmoid mass DDD L5/S1  . Dyslipidemia   . GERD (gastroesophageal reflux disease)   . Hematuria    h/o kidney stones  . Hiatal hernia   . Hypertension   . Left scapholunate ligament tear 2014   found on imaging  . Lung cancer ([place]) 1987   Partial RLL Lobectomy.  . Osteoarthritis   . Osteopenia 08/2011   DEXA: L femur -1.6  . Peptic ulcer   . Spinal abscess ([place]) 2000   secondary to periodontal disease        Review of Systems  Constitutional: Negative for chills, fever and unexpected weight change.  HENT: Negative for congestion, dental problem, ear pain, nosebleeds, postnasal drip, rhinorrhea, sinus pressure, sneezing, sore throat, trouble  swallowing and voice change.   Respiratory: Positive for shortness of breath. Negative for cough and choking.   Cardiovascular: Negative for chest pain and leg swelling.       Objective:   Physical Exam Vitals:   01/21/16 1417  BP: 116/64  BP Location: Left Arm  Cuff Size: Normal  Pulse: 71  SpO2: 96%  Weight: 157 lb (71.2 kg)  Height: '5\' 1"'$  (1.549 m)  On 4 L [place]  Gen: well appearing, no acute distress HEENT: NCAT,  EOMi, OP clear,  PULM: CTA B today, no wheezing CV: RRR, no mgr, no JVD AB: BS+, soft, nontender, no hsm Ext: warm, no edema, no clubbing, no cyanosis      Assessment & Plan:   GOLD 4 COPD with emphysema This has been a stable interval for [place]. [name] [name] still suffers from breathlessness but it's improved a bit since starting nebulized therapy.  [name] [name] has been benefiting from pulmonary rehabilitation.  Plan: Continue Brovana, Pulmicort, Spiriva Continue to participate in pulmonary rehabilitation Flu shot today Follow-up 4 months  Chronic respiratory failure with hypoxia (HCC) Continue 3 L of oxygen at rest, 4 L with exertion   Updated Medication List Outpatient Encounter Prescriptions as of 01/21/2016  Medication Sig Dispense Refill  . albuterol (PROAIR HFA) 108 (90 BASE) MCG/ACT inhaler  Inhale 2 puffs into the lungs every 4 (four) hours as needed. 1 Inhaler 5  . albuterol (PROVENTIL) (2.5 MG/3ML) 0.083% nebulizer solution Take every 4-6 hours as needed for shortness of breath 75 mL 5  . arformoterol (BROVANA) 15 MCG/2ML NEBU Take 2 mLs (15 mcg total) by nebulization 2 (two) times daily. 120 mL 11  . aspirin EC 81 MG tablet Take 1 tablet (81 mg total) by mouth every Monday, Wednesday, and Friday.    . benzonatate (TESSALON) 100 MG capsule Take by mouth 3 (three) times daily as needed for cough.    . budesonide (PULMICORT) 0.5 MG/2ML nebulizer solution Take 2 mLs (0.5 mg total) by nebulization 2 (two) times daily. 60 mL 12  . buPROPion (WELLBUTRIN SR) 150 MG  12 hr tablet TAKE ONE TABLET BY MOUTH TWICE DAILY 60 tablet 11  . conjugated estrogens (PREMARIN) vaginal cream Place 1 Applicatorful vaginally daily. Apply 0.'5mg'$  (pea-sized amount)  just inside the vaginal introitus with a finger-tip every night for two weeks and then Monday, Wednesday and Friday nights. 30 g 12  . CRANBERRY PO Take 2 tablets by mouth every morning.     Marland Kitchen estradiol (ESTRACE VAGINAL) 0.1 MG/GM vaginal cream Apply 0.'5mg'$  (pea-sized amount)  just inside the vaginal introitus with a finger-tip every night for two weeks and then Monday, Wednesday and Friday nights. 30 g 12  . guaifenesin (HUMIBID E) 400 MG TABS tablet Take 400 mg by mouth every 4 (four) hours.    Marland Kitchen losartan (COZAAR) 50 MG tablet TAKE ONE TABLET BY MOUTH EVERY DAY 30 tablet 11  . metoprolol tartrate (LOPRESSOR) 25 MG tablet TAKE ONE TABLET BY MOUTH TWICE DAILY 60 tablet 11  . MYRBETRIQ 25 MG TB24 tablet TAKE ONE TABLET BY MOUTH EVERY DAY 30 tablet 11  . omeprazole (PRILOSEC) 40 MG capsule TAKE ONE CAPSULE BY MOUTH EVERY DAY 30 capsule 11  . pravastatin (PRAVACHOL) 80 MG tablet ONE TABLET BY MOUTH AT BEDTIME 90 tablet 1  . psyllium (METAMUCIL) 58.6 % powder Take 1 packet by mouth at bedtime.     . sertraline (ZOLOFT) 100 MG tablet Take one tablet in the morning and one-half at bedtime 45 tablet 11  . SPIRIVA HANDIHALER 18 MCG inhalation capsule INHALE CONTENTS OF 1 CAPSULE ONCE DAILY. 30 capsule 5  . vitamin B-12 (CYANOCOBALAMIN) 1000 MCG tablet Take 1,000 mcg by mouth daily.    . [DISCONTINUED] HYDROcodone-acetaminophen (NORCO/VICODIN) 5-325 MG tablet Take 1 tablet by mouth every 6 (six) hours as needed for moderate pain.    . [DISCONTINUED] levofloxacin (LEVAQUIN) 500 MG tablet     . [DISCONTINUED] predniSONE (DELTASONE) 10 MG tablet      No facility-administered encounter medications on file as of 01/21/2016.

## 2016-01-23 ENCOUNTER — Ambulatory Visit
Admission: RE | Admit: 2016-01-23 | Discharge: 2016-01-23 | Disposition: A | Discharge status: Home / Self Care | Payer: Medicare Other | Source: Ambulatory Visit | Attending: Urology | Admitting: Urology

## 2016-01-23 ENCOUNTER — Encounter: Payer: Medicare Other | Attending: Pulmonary Disease | Admitting: *Deleted

## 2016-01-23 DIAGNOSIS — J449 Chronic obstructive pulmonary disease, unspecified: Secondary | ICD-10-CM

## 2016-01-23 DIAGNOSIS — N2889 Other specified disorders of kidney and ureter: Secondary | ICD-10-CM | POA: Diagnosis not present

## 2016-01-23 DIAGNOSIS — J432 Centrilobular emphysema: Secondary | ICD-10-CM | POA: Insufficient documentation

## 2016-01-23 DIAGNOSIS — R109 Unspecified abdominal pain: Secondary | ICD-10-CM | POA: Insufficient documentation

## 2016-01-23 DIAGNOSIS — N390 Urinary tract infection, site not specified: Secondary | ICD-10-CM

## 2016-01-23 NOTE — Progress Notes (Signed)
Daily Session Note  Patient Details  Name: [name] [name] MRN: [id number] Date of Birth: January 20, 1941 Referring Provider:   Flowsheet Row Pulmonary Rehab from 12/16/2015 in [facility] Cardiac and Pulmonary Rehab  Referring Provider  Simonne Maffucci MD      Encounter Date: 01/23/2016  Check In:     Session Check In - 01/23/16 1054      Check-In   Location [facility]-Cardiac & Pulmonary Rehab   Staff Present Gerlene Burdock, RN, BSN;Stacey Blanch Media, RRT, RCP, Respiratory Dareen Piano, BA, ACSM CEP, Exercise Physiologist   Supervising physician immediately available to respond to emergencies [facility] immediately available ER MD   Physician(s) Drs. Jimmye Norman and Archie Balboa   Medication changes reported     No   Fall or balance concerns reported    No   Warm-up and Cool-down Performed as group-led Location manager Performed Yes   VAD Patient? No     Pain Assessment   Currently in Pain? No/denies   Multiple Pain Sites No         Goals Met:  Independence with exercise equipment Using PLB without cueing & demonstrates good technique Exercise tolerated well Strength training completed today  Goals Unmet:  Not Applicable  Comments: Pt able to follow exercise prescription today without complaint.  Will continue to monitor for progression.    Dr. Emily Filbert is Medical Director for [place] and [facility].

## 2016-01-27 ENCOUNTER — Encounter: Payer: Self-pay | Admitting: *Deleted

## 2016-01-27 DIAGNOSIS — J449 Chronic obstructive pulmonary disease, unspecified: Secondary | ICD-10-CM

## 2016-01-27 NOTE — Progress Notes (Signed)
Pulmonary Individual Treatment Plan  Patient Details  Name: [name] [name] MRN: [id number] Date of Birth: March 02, 1941 Referring Provider:   Flowsheet Row Pulmonary Rehab from 12/16/2015 in [facility] Cardiac and Pulmonary Rehab  Referring Provider  Simonne Maffucci MD      Initial Encounter Date:  Flowsheet Row Pulmonary Rehab from 12/16/2015 in [facility] Cardiac and Pulmonary Rehab  Date  12/16/15  Referring Provider  Simonne Maffucci MD      Visit Diagnosis: COPD, severe ([place])  Patient's Home Medications on Admission:  Current Outpatient Prescriptions:  .  albuterol (PROAIR HFA) 108 (90 BASE) MCG/ACT inhaler, Inhale 2 puffs into the lungs every 4 (four) hours as needed., Disp: 1 Inhaler, Rfl: 5 .  albuterol (PROVENTIL) (2.5 MG/3ML) 0.083% nebulizer solution, Take every 4-6 hours as needed for shortness of breath, Disp: 75 mL, Rfl: 5 .  arformoterol (BROVANA) 15 MCG/2ML NEBU, Take 2 mLs (15 mcg total) by nebulization 2 (two) times daily., Disp: 120 mL, Rfl: 11 .  aspirin EC 81 MG tablet, Take 1 tablet (81 mg total) by mouth every Monday, Wednesday, and Friday., Disp: , Rfl:  .  benzonatate (TESSALON) 100 MG capsule, Take by mouth 3 (three) times daily as needed for cough., Disp: , Rfl:  .  budesonide (PULMICORT) 0.5 MG/2ML nebulizer solution, Take 2 mLs (0.5 mg total) by nebulization 2 (two) times daily., Disp: 60 mL, Rfl: 12 .  buPROPion (WELLBUTRIN SR) 150 MG 12 hr tablet, TAKE ONE TABLET BY MOUTH TWICE DAILY, Disp: 60 tablet, Rfl: 11 .  conjugated estrogens (PREMARIN) vaginal cream, Place 1 Applicatorful vaginally daily. Apply 0.16m (pea-sized amount)  just inside the vaginal introitus with a finger-tip every night for two weeks and then Monday, Wednesday and Friday nights., Disp: 30 g, Rfl: 12 .  CRANBERRY PO, Take 2 tablets by mouth every morning. , Disp: , Rfl:  .  estradiol (ESTRACE VAGINAL) 0.1 MG/GM vaginal cream, Apply 0.579m(pea-sized amount)  just inside the vaginal introitus with a  finger-tip every night for two weeks and then Monday, Wednesday and Friday nights., Disp: 30 g, Rfl: 12 .  guaifenesin (HUMIBID E) 400 MG TABS tablet, Take 400 mg by mouth every 4 (four) hours., Disp: , Rfl:  .  losartan (COZAAR) 50 MG tablet, TAKE ONE TABLET BY MOUTH EVERY DAY, Disp: 30 tablet, Rfl: 11 .  metoprolol tartrate (LOPRESSOR) 25 MG tablet, TAKE ONE TABLET BY MOUTH TWICE DAILY, Disp: 60 tablet, Rfl: 11 .  MYRBETRIQ 25 MG TB24 tablet, TAKE ONE TABLET BY MOUTH EVERY DAY, Disp: 30 tablet, Rfl: 11 .  omeprazole (PRILOSEC) 40 MG capsule, TAKE ONE CAPSULE BY MOUTH EVERY DAY, Disp: 30 capsule, Rfl: 11 .  pravastatin (PRAVACHOL) 80 MG tablet, ONE TABLET BY MOUTH AT BEDTIME, Disp: 90 tablet, Rfl: 1 .  psyllium (METAMUCIL) 58.6 % powder, Take 1 packet by mouth at bedtime. , Disp: , Rfl:  .  sertraline (ZOLOFT) 100 MG tablet, Take one tablet in the morning and one-half at bedtime, Disp: 45 tablet, Rfl: 11 .  SPIRIVA HANDIHALER 18 MCG inhalation capsule, INHALE CONTENTS OF 1 CAPSULE ONCE DAILY., Disp: 30 capsule, Rfl: 5 .  vitamin B-12 (CYANOCOBALAMIN) 1000 MCG tablet, Take 1,000 mcg by mouth daily., Disp: , Rfl:   Past Medical History: Past Medical History:  Diagnosis Date  . Chronic cystitis    recurrent UTI (4+ in last year), on suppressive doxy (uro Cope)  . Colon polyp   . 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  Depression with anxiety   . Diverticular disease 10/09/09   CT abd no sigmoid mass DDD L5/S1  . Dyslipidemia   . GERD (gastroesophageal reflux disease)   . Hematuria    h/o kidney stones  . Hiatal hernia   . Hypertension   . Left scapholunate ligament tear 2014   found on imaging  . Lung cancer ([place]) 1987   Partial RLL Lobectomy.  . Osteoarthritis   . Osteopenia 08/2011   DEXA: L femur -1.6  . Peptic ulcer   . Spinal abscess ([place]) 2000   secondary to periodontal disease    Tobacco Use: History  Smoking Status  . Former Smoker  . Packs/day: 3.00  . Years: 34.00  . Types:  Cigarettes  . Quit date: 05/29/1995  Smokeless Tobacco  . Never Used    Labs: Recent Review Flowsheet Data    Labs for ITP Cardiac and Pulmonary Rehab Latest Ref Rng & Units 09/06/2011 11/13/2012 02/13/2014 02/25/2015 09/02/2015   Cholestrol 0 - 200 mg/dL 228(H) 188 219(H) 204(H) 147   LDLCALC 0 - 99 mg/dL - 88 108(H) 100(H) 57   LDLDIRECT mg/dL 135.9 - - - -   HDL >39.00 mg/dL 65.50 64.60 71.30 76.90 67.90   Trlycerides 0.0 - 149.0 mg/dL 166.0(H) 175.0(H) 200.0(H) 132.0 113.0   Hemoglobin A1c 4.6 - 6.5 % - - 5.5 - -       ADL UCSD:     Pulmonary Assessment Scores    Row Name 12/16/15 1511         ADL UCSD   ADL Phase Entry     SOB Score total 74     Rest 0     Walk 0     Stairs 0     Bath 2     Dress 4     Shop 5        Pulmonary Function Assessment:     Pulmonary Function Assessment - 12/16/15 1511      Initial Spirometry Results   Comments Test Date: 12/16/2015      Exercise Target Goals:    Exercise Program Goal: Individual exercise prescription set with THRR, safety & activity barriers. Participant demonstrates ability to understand and report RPE using BORG scale, to self-measure pulse accurately, and to acknowledge the importance of the exercise prescription.  Exercise Prescription Goal: Starting with aerobic activity 30 plus minutes a day, 3 days per week for initial exercise prescription. Provide home exercise prescription and guidelines that participant acknowledges understanding prior to discharge.  Activity Barriers & Risk Stratification:     Activity Barriers & Cardiac Risk Stratification - 12/16/15 1507      Activity Barriers & Cardiac Risk Stratification   Activity Barriers Deconditioning;Shortness of Breath   Cardiac Risk Stratification Moderate      6 Minute Walk:     6 Minute Walk    Row Name 12/16/15 1544         6 Minute Walk   Phase Initial     Distance 1000 feet     Walk Time 6 minutes     # of Rest Breaks 0     MPH 1.89      METS 2.1     RPE 17     Perceived Dyspnea  2     VO2 Peak 7.35     Symptoms Yes (comment)     Comments fatigued     Resting HR 69 bpm     Resting BP 116/54  Max Ex. HR 91 bpm     Max Ex. BP 144/56     2 Minute Post BP 144/60       Interval HR   Baseline HR 69     1 Minute HR 80     2 Minute HR 85     3 Minute HR 84     4 Minute HR 89     5 Minute HR 89     6 Minute HR 91     2 Minute Post HR 82     Interval Heart Rate? Yes       Interval Oxygen   Interval Oxygen? Yes     Baseline Oxygen Saturation % 97 %     Baseline Liters of Oxygen 4 L     1 Minute Oxygen Saturation % 95 %     1 Minute Liters of Oxygen 4 L     2 Minute Oxygen Saturation % 94 %     2 Minute Liters of Oxygen 4 L     3 Minute Oxygen Saturation % 94 %     3 Minute Liters of Oxygen 4 L     4 Minute Oxygen Saturation % 95 %     4 Minute Liters of Oxygen 4 L     5 Minute Oxygen Saturation % 94 %     5 Minute Liters of Oxygen 4 L     6 Minute Oxygen Saturation % 96 %     6 Minute Liters of Oxygen 4 L     2 Minute Post Oxygen Saturation % 96 %     2 Minute Post Liters of Oxygen 4 L        Initial Exercise Prescription:     Initial Exercise Prescription - 12/16/15 1500      Date of Initial Exercise RX and Referring Provider   Date 12/16/15   Referring Provider Simonne Maffucci MD     Oxygen   Oxygen Continuous   Liters 4     Treadmill   MPH 1.4   Grade 0   Minutes 15   METs 2.07     NuStep   Level 1   Minutes 15   METs 2     Recumbant Elliptical   Level 1   RPM 50   Minutes 15   METs 2     Prescription Details   Frequency (times per week) 3   Duration Progress to 45 minutes of aerobic exercise without signs/symptoms of physical distress     Intensity   THRR 40-80% of Max Heartrate 99-131   Ratings of Perceived Exertion 11-13   Perceived Dyspnea 0-4     Progression   Progression Continue to progress workloads to maintain intensity without signs/symptoms of physical  distress.     Resistance Training   Training Prescription Yes   Weight 2 lbs   Reps 10-12      Perform Capillary Blood Glucose checks as needed.  Exercise Prescription Changes:     Exercise Prescription Changes    Row Name 12/24/15 1400 01/06/16 1400 01/07/16 1400 01/21/16 1400       Exercise Review   Progression  - Yes Yes Yes      Response to Exercise   Blood Pressure (Admit) 138/68 142/68  - 134/62    Blood Pressure (Exercise) 142/68 148/70  - 124/58    Blood Pressure (Exit) 122/68 110/62  - 106/60    Heart Rate (Admit) 67 bpm  64 bpm  - 65 bpm    Heart Rate (Exercise) 82 bpm 83 bpm  - 77 bpm    Heart Rate (Exit) 67 bpm 70 bpm  - 58 bpm    Oxygen Saturation (Admit) 97 % 97 %  - 90 %    Oxygen Saturation (Exercise) 94 % 96 %  - 98 %    Oxygen Saturation (Exit) 99 % 97 %  - 99 %    Rating of Perceived Exertion (Exercise) 13 13  - 13    Perceived Dyspnea (Exercise) 2 4  - 4    Symptoms none none none none    Comments  -  - Home Exercise Guidelines given 01/07/16 Home Exercise Guidelines given 01/07/16    Duration Progress to 45 minutes of aerobic exercise without signs/symptoms of physical distress Progress to 45 minutes of aerobic exercise without signs/symptoms of physical distress Progress to 45 minutes of aerobic exercise without signs/symptoms of physical distress Progress to 45 minutes of aerobic exercise without signs/symptoms of physical distress    Intensity THRR unchanged THRR unchanged THRR unchanged THRR unchanged      Progression   Progression Continue to progress workloads to maintain intensity without signs/symptoms of physical distress. Continue to progress workloads to maintain intensity without signs/symptoms of physical distress. Continue to progress workloads to maintain intensity without signs/symptoms of physical distress. Continue to progress workloads to maintain intensity without signs/symptoms of physical distress.    Average METs 2.1 2.64 2.64 2.2       Resistance Training   Training Prescription Yes Yes Yes Yes    Weight 2 lbs 2 lbs 2 lbs 3 lbs    Reps 10-12 10-15 10-15 10-15      Interval Training   Interval Training No No No No      Oxygen   Oxygen Continuous Continuous Continuous Continuous    Liters _0 Treadmill   MPH 1.6 1.7 1.7 1.8    Grade 0 0.5 0.5 0.5    Minutes _1 METs 2.23 2.3 2.3 2.5      NuStep   Level _2 Minutes _3 METs 1._4 1.9      REL-XR   Level _5 Minutes _6 METs 1.8 3.4 3.4 2.2      Home Exercise Plan   Plans to continue exercise at  -  - Home  walking and gym in complex Home  walking and gym in complex    Frequency  -  - Add 1 additional day to program exercise sessions. Add 1 additional day to program exercise sessions.       Exercise Comments:     Exercise Comments    Row Name 12/16/15 1548 12/24/15 1404 01/06/16 1456 01/07/16 1114 01/21/16 1437   Exercise Comments Jacquelyn wants to improve her ability to walk to be able to go shopping again. First full day of exercise!  Patient was oriented to gym and equipment including functions, settings, policies, and procedures.  Patient's individual exercise prescription and treatment plan were reviewed.  All starting workloads were established based on the results of the 6 minute walk test done at initial orientation visit.  The plan for exercise progression was also introduced and progression will be customized based on patient's performance and goals. Rayelynn is  off to a great start with her exercise.  She is up to a full 15 min on each piece of equipment.  We will continue to monitor for progression. Reviewed home exercise with pt today.  Pt plans to walk and go to complex gym for exercise.  Reviewed THR, pulse, RPE, sign and symptoms, O2 use, and when to call 911 or MD.  Also discussed weather considerations and indoor options.  Pt voiced understanding. Sally-Ann is doing well with exercise. She  continues to move up on her workloads.  She is up to level 6 on the XR.  We will continue to monitor her progression.      Discharge Exercise Prescription (Final Exercise Prescription Changes):     Exercise Prescription Changes - 01/21/16 1400      Exercise Review   Progression Yes     Response to Exercise   Blood Pressure (Admit) 134/62   Blood Pressure (Exercise) 124/58   Blood Pressure (Exit) 106/60   Heart Rate (Admit) 65 bpm   Heart Rate (Exercise) 77 bpm   Heart Rate (Exit) 58 bpm   Oxygen Saturation (Admit) 90 %   Oxygen Saturation (Exercise) 98 %   Oxygen Saturation (Exit) 99 %   Rating of Perceived Exertion (Exercise) 13   Perceived Dyspnea (Exercise) 4   Symptoms none   Comments Home Exercise Guidelines given 01/07/16   Duration Progress to 45 minutes of aerobic exercise without signs/symptoms of physical distress   Intensity THRR unchanged     Progression   Progression Continue to progress workloads to maintain intensity without signs/symptoms of physical distress.   Average METs 2.2     Resistance Training   Training Prescription Yes   Weight 3 lbs   Reps 10-15     Interval Training   Interval Training No     Oxygen   Oxygen Continuous   Liters 4     Treadmill   MPH 1.8   Grade 0.5   Minutes 15   METs 2.5     NuStep   Level 5   Minutes 15   METs 1.9     REL-XR   Level 6   Minutes 15   METs 2.2     Home Exercise Plan   Plans to continue exercise at Home  walking and gym in complex   Frequency Add 1 additional day to program exercise sessions.       Nutrition:  Target Goals: Understanding of nutrition guidelines, daily intake of sodium <1582m, cholesterol <2038m calories 30% from fat and 7% or less from saturated fats, daily to have 5 or more servings of fruits and vegetables.  Biometrics:    Nutrition Therapy Plan and Nutrition Goals:     Nutrition Therapy & Goals - 12/16/15 1515      Nutrition Therapy   Diet Ms Dooner  prefers not to meet with the dietitian. They are residents at a retirement community and eat their lunch at their dining facility.      Nutrition Discharge: Rate Your Plate Scores:   Psychosocial: Target Goals: Acknowledge presence or absence of depression, maximize coping skills, provide positive support system. Participant is able to verbalize types and ability to use techniques and skills needed for reducing stress and depression.  Initial Review & Psychosocial Screening:     Initial Psych Review & Screening - 12/16/15 [place]   Comments Ms Coil has good support from  her husband. She states She does not have depression, but medicates with Zoloft and Wellbutrin. Due to her shortness of breath, Ms Marandola misses certain activities she use to perform.     Barriers   Psychosocial barriers to participate in program The patient should benefit from training in stress management and relaxation.     Screening Interventions   Interventions Encouraged to exercise;Program counselor consult      Quality of Life Scores:     Quality of Life - 12/16/15 1524      Quality of Life Scores   Health/Function Pre 19.63 %   Socioeconomic Pre 20.33 %   Psych/Spiritual Pre 19.29 %   Family Pre 21 %   GLOBAL Pre 19.88 %      PHQ-9: Recent Review Flowsheet Data    Depression screen [facility] 2/9 12/16/2015 03/04/2015 02/18/2014 11/13/2012   Decreased Interest 2 0  0 1   Down, Depressed, Hopeless 1 1 0 1   PHQ - 2 Score 3 1 0 2   Altered sleeping 0 - - 0   Tired, decreased energy 2 - - 2   Change in appetite 0 - - 0   Feeling bad or failure about yourself  0 - - 1   Trouble concentrating 0 - - 0   Moving slowly or fidgety/restless 0 - - 0   Suicidal thoughts 1 - - 0   PHQ-9 Score 6 - - 5   Difficult doing work/chores Very difficult - - -      Psychosocial Evaluation and Intervention:     Psychosocial Evaluation - 12/31/15 1058       Psychosocial Evaluation & Interventions   Interventions Encouraged to exercise with the program and follow exercise prescription   Comments Counselor met with Ms. Quinn (Ms. Mamie Nick) today for initial psychosocial evaluation.  She is a 75 year old who has struggled with COPD since 2011.  She has a strong support system with a spouse of 15 years and adult children who live close by.  Ms. Mamie Nick has bladder problems in addition to Pulmonary issues and will be seeing a Dr. for this tomorrow.  She has a good appetite and sleeps well with the help of Benadryl nightly (due to itching).  Ms. Mamie Nick has a history of depression and reports her symptoms are adequately treated with the help of the current medications she is taking.  She is typically in a positive mood and has minimal stress in her life.  Ms. Mamie Nick has goals to lose some inches and get her energy levels up while in this program.  She lives in a retirement community and will continue exercising with her spouse there upon completion of this program.  Counselor will follow with Ms. P while in this program.        Psychosocial Re-Evaluation:     Psychosocial Re-Evaluation    [place] Name 01/05/16 1128             Psychosocial Re-Evaluation   Comments Follow up with Ms. Mamie Nick today stating she is already seeing some progress since coming into this program.  She has more stamina and is able to do more.  She continues to sleep well and is typically positive.  Counselor commended Ms. P for her hard work and commitment to exercise consistently.           Education: Education Goals: Education classes will be provided on a weekly basis, covering required topics. Participant will state understanding/return demonstration of topics  presented.  Learning Barriers/Preferences:     Learning Barriers/Preferences - 12/16/15 1507      Learning Barriers/Preferences   Learning Barriers None   Learning Preferences Group Instruction;Individual Instruction;Pictoral;Skilled  Demonstration;Verbal Instruction;Video;Written Material      Education Topics: Initial Evaluation Education: - Verbal, written and demonstration of respiratory meds, RPE/PD scales, oximetry and breathing techniques. Instruction on use of nebulizers and MDIs: cleaning and proper use, rinsing mouth with steroid doses and importance of monitoring MDI activations. Flowsheet Row Pulmonary Rehab from 01/23/2016 in [facility] Cardiac and Pulmonary Rehab  Date  12/16/15  Educator  LB  Instruction Review Code  2- meets goals/outcomes      General Nutrition Guidelines/Fats and Fiber: -Group instruction provided by verbal, written material, models and posters to present the general guidelines for heart healthy nutrition. Gives an explanation and review of dietary fats and fiber.   Controlling Sodium/Reading Food Labels: -Group verbal and written material supporting the discussion of sodium use in heart healthy nutrition. Review and explanation with models, verbal and written materials for utilization of the food label.   Exercise Physiology & Risk Factors: - Group verbal and written instruction with models to review the exercise physiology of the cardiovascular system and associated critical values. Details cardiovascular disease risk factors and the goals associated with each risk factor. Flowsheet Row Pulmonary Rehab from 01/23/2016 in [facility] Cardiac and Pulmonary Rehab  Date  01/14/16  Educator  AS  Instruction Review Code  2- meets goals/outcomes      Aerobic Exercise & Resistance Training: - Gives group verbal and written discussion on the health impact of inactivity. On the components of aerobic and resistive training programs and the benefits of this training and how to safely progress through these programs.   Flexibility, Balance, General Exercise Guidelines: - Provides group verbal and written instruction on the benefits of flexibility and balance training programs. Provides general exercise  guidelines with specific guidelines to those with heart or lung disease. Demonstration and skill practice provided.   Stress Management: - Provides group verbal and written instruction about the health risks of elevated stress, cause of high stress, and healthy ways to reduce stress.   Depression: - Provides group verbal and written instruction on the correlation between heart/lung disease and depressed mood, treatment options, and the stigmas associated with seeking treatment.   Exercise & Equipment Safety: - Individual verbal instruction and demonstration of equipment use and safety with use of the equipment.   Infection Prevention: - Provides verbal and written material to individual with discussion of infection control including proper hand washing and proper equipment cleaning during exercise session. Flowsheet Row Pulmonary Rehab from 01/23/2016 in [facility] Cardiac and Pulmonary Rehab  Date  12/16/15  Educator  LB  Instruction Review Code  2- meets goals/outcomes      Falls Prevention: - Provides verbal and written material to individual with discussion of falls prevention and safety. Flowsheet Row Pulmonary Rehab from 01/23/2016 in [facility] Cardiac and Pulmonary Rehab  Date  12/16/15  Educator  LB  Instruction Review Code  2- meets goals/outcomes      Diabetes: - Individual verbal and written instruction to review signs/symptoms of diabetes, desired ranges of glucose level fasting, after meals and with exercise. Advice that pre and post exercise glucose checks will be done for 3 sessions at entry of program.   Chronic Lung Diseases: - Group verbal and written instruction to review new updates, new respiratory medications, new advancements in procedures and treatments. Provide informative websites  and "800" numbers of self-education.   Lung Procedures: - Group verbal and written instruction to describe testing methods done to diagnose lung disease. Review the outcome of test  results. Describe the treatment choices: Pulmonary Function Tests, ABGs and oximetry.   Energy Conservation: - Provide group verbal and written instruction for methods to conserve energy, plan and organize activities. Instruct on pacing techniques, use of adaptive equipment and posture/positioning to relieve shortness of breath.   Triggers: - Group verbal and written instruction to review types of environmental controls: home humidity, furnaces, filters, dust mite/pet prevention, HEPA vacuums. To discuss weather changes, air quality and the benefits of nasal washing.   Exacerbations: - Group verbal and written instruction to provide: warning signs, infection symptoms, calling MD promptly, preventive modes, and value of vaccinations. Review: effective airway clearance, coughing and/or vibration techniques. Create an Sports administrator. Flowsheet Row Pulmonary Rehab from 01/23/2016 in [facility] Cardiac and Pulmonary Rehab  Date  01/05/16  Educator  LB  Instruction Review Code  2- meets goals/outcomes      Oxygen: - Individual and group verbal and written instruction on oxygen therapy. Includes supplement oxygen, available portable oxygen systems, continuous and intermittent flow rates, oxygen safety, concentrators, and Medicare reimbursement for oxygen. Flowsheet Row Pulmonary Rehab from 01/23/2016 in [facility] Cardiac and Pulmonary Rehab  Date  12/16/15  Educator  LB  Instruction Review Code  2- meets goals/outcomes      Respiratory Medications: - Group verbal and written instruction to review medications for lung disease. Drug class, frequency, complications, importance of spacers, rinsing mouth after steroid MDI's, and proper cleaning methods for nebulizers. Flowsheet Row Pulmonary Rehab from 01/23/2016 in [facility] Cardiac and Pulmonary Rehab  Date  12/16/15  Educator  LB  Instruction Review Code  2- meets goals/outcomes      AED/CPR: - Group verbal and written instruction with the use of models to  demonstrate the basic use of the AED with the basic ABC's of resuscitation. Flowsheet Row Pulmonary Rehab from 01/23/2016 in [facility] Cardiac and Pulmonary Rehab  Date  12/26/15  Educator  CE  Instruction Review Code  2- meets goals/outcomes      Breathing Retraining: - Provides individuals verbal and written instruction on purpose, frequency, and proper technique of diaphragmatic breathing and pursed-lipped breathing. Applies individual practice skills. Flowsheet Row Pulmonary Rehab from 01/23/2016 in [facility] Cardiac and Pulmonary Rehab  Date  12/24/15  Educator  LB  Instruction Review Code  2- meets goals/outcomes      Anatomy and Physiology of the Lungs: - Group verbal and written instruction with the use of models to provide basic lung anatomy and physiology related to function, structure and complications of lung disease. Flowsheet Row Pulmonary Rehab from 01/23/2016 in [facility] Cardiac and Pulmonary Rehab  Date  01/16/16  Educator  [place]  Instruction Review Code  2- meets goals/outcomes      Heart Failure: - Group verbal and written instruction on the basics of heart failure: signs/symptoms, treatments, explanation of ejection fraction, enlarged heart and cardiomyopathy. Flowsheet Row Pulmonary Rehab from 01/23/2016 in [facility] Cardiac and Pulmonary Rehab  Date  01/23/16  Educator  CE  Instruction Review Code  2- meets goals/outcomes      Sleep Apnea: - Individual verbal and written instruction to review Obstructive Sleep Apnea. Review of risk factors, methods for diagnosing and types of masks and machines for OSA.   Anxiety: - Provides group, verbal and written instruction on the correlation between heart/lung disease and anxiety, treatment options,  and management of anxiety.   Relaxation: - Provides group, verbal and written instruction about the benefits of relaxation for patients with heart/lung disease. Also provides patients with examples of relaxation techniques. Flowsheet Row  Pulmonary Rehab from 01/23/2016 in [facility] Cardiac and Pulmonary Rehab  Date  12/31/15  Educator  [facility]  Instruction Review Code  2- Meets goals/outcomes      Knowledge Questionnaire Score:     Knowledge Questionnaire Score - 12/16/15 1508      Knowledge Questionnaire Score   Pre Score 8/10       Core Components/Risk Factors/Patient Goals at Admission:     Personal Goals and Risk Factors at Admission - 12/16/15 1517      Core Components/Risk Factors/Patient Goals on Admission   Sedentary Yes  Residents at [facility], so access to their gym facility   Intervention Provide advice, education, support and counseling about physical activity/exercise needs.;Develop an individualized exercise prescription for aerobic and resistive training based on initial evaluation findings, risk stratification, comorbidities and participant's personal goals.   Expected Outcomes Achievement of increased cardiorespiratory fitness and enhanced flexibility, muscular endurance and strength shown through measurements of functional capacity and personal statement of participant.   Increase Strength and Stamina Yes   Intervention Provide advice, education, support and counseling about physical activity/exercise needs.;Develop an individualized exercise prescription for aerobic and resistive training based on initial evaluation findings, risk stratification, comorbidities and participant's personal goals.   Expected Outcomes Achievement of increased cardiorespiratory fitness and enhanced flexibility, muscular endurance and strength shown through measurements of functional capacity and personal statement of participant.   Improve shortness of breath with ADL's Yes   Intervention Provide education, individualized exercise plan and daily activity instruction to help decrease symptoms of SOB with activities of daily living.   Expected Outcomes Short Term: Achieves a reduction of symptoms when performing activities of daily  living.   Develop more efficient breathing techniques such as purse lipped breathing and diaphragmatic breathing; and practicing self-pacing with activity Yes   Intervention Provide education, demonstration and support about specific breathing techniuqes utilized for more efficient breathing. Include techniques such as pursed lipped breathing, diaphragmatic breathing and self-pacing activity.   Expected Outcomes Short Term: Participant will be able to demonstrate and use breathing techniques as needed throughout daily activities.   Increase knowledge of respiratory medications and ability to use respiratory devices properly  Yes  Uses SVN with Albuterol, Brovana, and Pulmicort; uses Spiriva; Albuterol MDI and spacer given. Oxygen 4-5l/m; portable concentrator   Intervention Provide education and demonstration as needed of appropriate use of medications, inhalers, and oxygen therapy.   Expected Outcomes Short Term: Achieves understanding of medications use. Understands that oxygen is a medication prescribed by physician. Demonstrates appropriate use of inhaler and oxygen therapy.   Personal Goal Other Yes   Personal Goal Able to go shopping again   Intervention Provide exercise prescription and education to increase functional capacity and improved shortness of breath   Expected Outcomes Able to gain strength and stamina to shopping.      Core Components/Risk Factors/Patient Goals Review:      Goals and Risk Factor Review    Row Name 12/24/15 1401 01/09/16 1057 01/09/16 1406 01/09/16 1408 01/09/16 1410     Core Components/Risk Factors/Patient Goals Review   Personal Goals Review Develop more efficient breathing techniques such as purse lipped breathing and diaphragmatic breathing and practicing self-pacing with activity. Weight Management/Obesity Improve shortness of breath with ADL's Increase knowledge of respiratory medications and  ability to use respiratory devices properly. Develop more  efficient breathing techniques such as purse lipped breathing and diaphragmatic breathing and practicing self-pacing with activity.   Review Reivewed PLB with pt today.  Reviewed technique and how it can help with reducing shortness of breath. Paullette said it is hard for her to lose weight. Yanelli said "it will probably help me to lose weight with the exercise in [facility]. Discussed writing down her food intake as one option when she asked me how I keep my weight down. Ms. Ostroff is seeing progress in her ADL's.  She says that it is easier for her to walk farther and with less SOB since she has started Pulmonary Rehab.  Ms. Polizzi takes Proair, Brovana, Pulmicort and Spiriva for her respiratory disease.  She has a spacer to use with the Proair.  She understands how to take all of her medications and does not have any questions at this time.  Ms. Cutshaw demonstrated the proper technique for PLB while exercising.   Expected Outcomes Able to use PLB independently without queuing WEight loss of 5lbs. Continued exercise should help her progress with ADL's and decrease her SOB even further.  Continuing to take her medications as prescribed and when needed with help Ms. Nussbaumer decrease her exacerbations and SOB. Understanding the proper technique will help her recover from SOB, exaberations and anxiety if these issues occur.    [place]ber Name 01/12/16 1106             Core Components/Risk Factors/Patient Goals Review   Personal Goals Review Increase Strength and Stamina;Sedentary       Review Home exercise was reviewed with the patient. She has not yet started home exercise but has checked into a fitness complex at her residence. She reported improvements in strength and stamina, as well as having a better attitude.        Expected Outcomes Patient will start home exercise at the complex at her residence, increasing her frequency of exercise, which will aid in her continued progress of increasing her  strength and stamina.           Core Components/Risk Factors/Patient Goals at Discharge (Final Review):      Goals and Risk Factor Review - 01/12/16 1106      Core Components/Risk Factors/Patient Goals Review   Personal Goals Review Increase Strength and Stamina;Sedentary   Review Home exercise was reviewed with the patient. She has not yet started home exercise but has checked into a fitness complex at her residence. She reported improvements in strength and stamina, as well as having a better attitude.    Expected Outcomes Patient will start home exercise at the complex at her residence, increasing her frequency of exercise, which will aid in her continued progress of increasing her strength and stamina.       ITP Comments:     ITP Comments    Row Name 01/12/16 1113           ITP Comments Ms. Mumaw lost her balance during stretching exercises and did fall. She fell back on her buttocks and her head hit several strands of soft resistive bands and bruised her right forarm.  She repored that she felt ok. Safety report will be filed.          Comments: 30 Day Review

## 2016-01-28 ENCOUNTER — Ambulatory Visit (INDEPENDENT_AMBULATORY_CARE_PROVIDER_SITE_OTHER): Payer: Medicare Other | Admitting: Urology

## 2016-01-28 ENCOUNTER — Encounter: Payer: Self-pay | Admitting: Urology

## 2016-01-28 ENCOUNTER — Encounter: Payer: Medicare Other | Admitting: *Deleted

## 2016-01-28 VITALS — BP 134/67 | HR 71 | Ht 63.0 in | Wt 157.4 lb

## 2016-01-28 DIAGNOSIS — J449 Chronic obstructive pulmonary disease, unspecified: Secondary | ICD-10-CM

## 2016-01-28 DIAGNOSIS — N3941 Urge incontinence: Secondary | ICD-10-CM

## 2016-01-28 DIAGNOSIS — N952 Postmenopausal atrophic vaginitis: Secondary | ICD-10-CM

## 2016-01-28 DIAGNOSIS — R350 Frequency of micturition: Secondary | ICD-10-CM

## 2016-01-28 DIAGNOSIS — J432 Centrilobular emphysema: Secondary | ICD-10-CM | POA: Diagnosis not present

## 2016-01-28 DIAGNOSIS — N390 Urinary tract infection, site not specified: Secondary | ICD-10-CM | POA: Diagnosis not present

## 2016-01-28 LAB — BLADDER SCAN AMB NON-IMAGING: Scan Result: 60

## 2016-01-28 NOTE — Progress Notes (Signed)
01/28/2016 3:10 PM   [name] [name] [birth year] [id number]  Referring provider: Ria Bush, MD [address] [place], [place] [address]  Chief Complaint  Patient presents with  . Vaginal Atrophy    3 week follow up   . Results    [place] and KUB    HPI: Patient is a 75 -year-old Caucasian female who presents today for three-week follow-up after initiating the vaginal estrogen cream for vaginal atrophy and recurrent urinary tract infections and increasing Myrbetriq for urge incontinence.   We will also discuss her renal ultrasound and KUB results.   Background history  Patient was referred to [place] by, Dr. Danise Mina, for recurrent urinary tract infections.  Patient states that she has had three urinary tract infections over the last month.  Her symptoms with a urinary tract infection consist of worsening urgency, frequency, and incontinence. She also experiences dysuria and intermittency during infections.   She does have a history of GU surgery (bladder sling with Dr. Jacqlyn Larsen 2005).   She does not have a history of nephrolithiasis or GU trauma.  Reviewing her records,  she has had three infections.  Each positive for E. Coli.   She is not sexually active.  She is post menopausal.   She admits to diarrhea.  She does engage in good perineal hygiene. She does not take tub baths.  She has urge incontinence.  She is using incontinence pads.   Today, she complains of urge incontinence, urgency and nocturia.  She states that she has noted a significant reduction of her urinary symptoms since initiating the Myrbetriq and vaginal estrogen cream.  She is very satisfied with the results at this time.  She denies gross hematuria, suprapubic pain, back pain, abdominal pain or flank pain.   She has not had any recent fevers, chills, nausea or vomiting.   KUB performed on 01/13/2016 noted moderately increased colonic stool burden reflecting constipation. No definite urinary tract stones were seen.    Renal ultrasound performed on 01/23/2016 noted no hydronephrosis or other acute abnormalities of the kidneys.  I have personally reviewed the films.  Patient is using her vaginal estrogen cream 3 nights weekly.  Her PVR today is 60 mL.  She has not had any urinary tract infections or symptoms of urinary tract infections since her last visit with [place] 3 weeks ago.   PMH: Past Medical History:  Diagnosis Date  . Chronic cystitis    recurrent UTI (4+ in last year), on suppressive doxy (uro Cope)  . Colon polyp   . 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 Depression with anxiety   . Diverticular disease 10/09/09   CT abd no sigmoid mass DDD L5/S1  . Dyslipidemia   . GERD (gastroesophageal reflux disease)   . Hematuria    h/o kidney stones  . Hiatal hernia   . Hypertension   . Left scapholunate ligament tear 2014   found on imaging  . Lung cancer ([place]) 1987   Partial RLL Lobectomy.  . Osteoarthritis   . Osteopenia 08/2011   DEXA: L femur -1.6  . Peptic ulcer   . Spinal abscess ([place]) 2000   secondary to periodontal disease    Surgical History: Past Surgical History:  Procedure Laterality Date  . BLADDER SURGERY    . BREAST BIOPSY Left 03/99   neg  . CHOLECYSTECTOMY  02/10/09  . COLONOSCOPY WITH PROPOFOL N/A 04/30/2014   Procedure: COLONOSCOPY WITH PROPOFOL;  Surgeon: Irene Shipper,  MD;  Location: [facility];  Service: Endoscopy;  Laterality: N/A;  . CYSTOSCOPY  02/11/07 and 09/25/08   Dr. Jacqlyn Larsen, normal  . DEXA  08/2011   T femur -1.6, T spine -0.6  . [place] pneumonia sepsis due to gum surgery  . REPLACEMENT TOTAL KNEE  09/2009   Right  . TOTAL ABDOMINAL HYSTERECTOMY  07/18/01   benign reason, BSO, vag repair secondary prolapse  . TUBAL LIGATION     bilateral    Home Medications:    Medication List       Accurate as of 01/28/16  3:10 PM. Always use your most recent med list.          albuterol 108 (90 Base) MCG/ACT inhaler Commonly known as:   PROAIR HFA Inhale 2 puffs into the lungs every 4 (four) hours as needed.   albuterol (2.5 MG/3ML) 0.083% nebulizer solution Commonly known as:  PROVENTIL Take every 4-6 hours as needed for shortness of breath   arformoterol 15 MCG/2ML Nebu Commonly known as:  BROVANA Take 2 mLs (15 mcg total) by nebulization 2 (two) times daily.   aspirin EC 81 MG tablet Take 1 tablet (81 mg total) by mouth every Monday, Wednesday, and Friday.   benzonatate 100 MG capsule Commonly known as:  TESSALON Take by mouth 3 (three) times daily as needed for cough.   budesonide 0.5 MG/2ML nebulizer solution Commonly known as:  PULMICORT Take 2 mLs (0.5 mg total) by nebulization 2 (two) times daily.   buPROPion 150 MG 12 hr tablet Commonly known as:  WELLBUTRIN SR TAKE ONE TABLET BY MOUTH TWICE DAILY   conjugated estrogens vaginal cream Commonly known as:  PREMARIN Place 1 Applicatorful vaginally daily. Apply 0.'5mg'$  (pea-sized amount)  just inside the vaginal introitus with a finger-tip every night for two weeks and then Monday, Wednesday and Friday nights.   CRANBERRY PO Take 2 tablets by mouth every morning.   estradiol 0.1 MG/GM vaginal cream Commonly known as:  ESTRACE VAGINAL Apply 0.'5mg'$  (pea-sized amount)  just inside the vaginal introitus with a finger-tip every night for two weeks and then Monday, Wednesday and Friday nights.   guaifenesin 400 MG Tabs tablet Commonly known as:  HUMIBID E Take 400 mg by mouth every 4 (four) hours.   losartan 50 MG tablet Commonly known as:  COZAAR TAKE ONE TABLET BY MOUTH EVERY DAY   metoprolol tartrate 25 MG tablet Commonly known as:  LOPRESSOR TAKE ONE TABLET BY MOUTH TWICE DAILY   MYRBETRIQ 25 MG Tb24 tablet Generic drug:  mirabegron ER TAKE ONE TABLET BY MOUTH EVERY DAY   omeprazole 40 MG capsule Commonly known as:  PRILOSEC TAKE ONE CAPSULE BY MOUTH EVERY DAY   pravastatin 80 MG tablet Commonly known as:  PRAVACHOL ONE TABLET BY MOUTH AT  BEDTIME   psyllium 58.6 % powder Commonly known as:  METAMUCIL Take 1 packet by mouth at bedtime.   sertraline 100 MG tablet Commonly known as:  ZOLOFT Take one tablet in the morning and one-half at bedtime   SPIRIVA HANDIHALER 18 MCG inhalation capsule Generic drug:  tiotropium INHALE CONTENTS OF 1 CAPSULE ONCE DAILY.   vitamin B-12 1000 MCG tablet Commonly known as:  CYANOCOBALAMIN Take 1,000 mcg by mouth daily.       Allergies:  Allergies  Allergen Reactions  . Keflex [Cephalexin] Shortness Of Breath  . Penicillins     REACTION: hives    Family History: Family History  Problem Relation Age of Onset  . Pancreatitis Mother   . Heart disease Father   . Hypertension Father   . Emphysema Father   . Arthritis Brother   . Hypertension Brother   . Cancer Maternal Aunt     ovarian cancer  . Kidney disease Neg Hx     Social History:  reports that she quit smoking about 20 years ago. Her smoking use included Cigarettes. She has a 102.00 pack-year smoking history. She has never used smokeless tobacco. She reports that she drinks alcohol. She reports that she does not use drugs.  ROS: UROLOGY Frequent Urination?: No Hard to postpone urination?: Yes Burning/pain with urination?: No Get up at night to urinate?: Yes Leakage of urine?: Yes Urine stream starts and stops?: No Trouble starting stream?: No Do you have to strain to urinate?: No Blood in urine?: No Urinary tract infection?: No Sexually transmitted disease?: No Injury to kidneys or bladder?: No Painful intercourse?: Yes Weak stream?: No Currently pregnant?: No Vaginal bleeding?: No Last menstrual period?: n  Gastrointestinal Nausea?: No Vomiting?: No Indigestion/heartburn?: Yes Diarrhea?: No Constipation?: No  Constitutional Fever: No Night sweats?: No Weight loss?: No Fatigue?: Yes  Skin Skin rash/lesions?: No Itching?: Yes  Eyes Blurred vision?: No Double vision?:  No  Ears/Nose/Throat Sore throat?: No Sinus problems?: Yes  Hematologic/Lymphatic Swollen glands?: No Easy bruising?: Yes  Cardiovascular Leg swelling?: No Chest pain?: No  Respiratory Cough?: No Shortness of breath?: Yes  Endocrine Excessive thirst?: No  Musculoskeletal Back pain?: Yes Joint pain?: No  Neurological Headaches?: No Dizziness?: No  Psychologic Depression?: No Anxiety?: No  Physical Exam: BP 134/67   Pulse 71   Ht '5\' 3"'$  (1.6 m)   Wt 157 lb 6.4 oz (71.4 kg)   BMI 27.88 kg/m   Constitutional: Well nourished. Alert and oriented, No acute distress. HEENT: [place] AT, moist mucus membranes. Trachea midline, no masses. Cardiovascular: No clubbing, cyanosis, or edema. Respiratory: Normal respiratory effort, no increased work of breathing. Skin: No rashes, bruises or suspicious lesions. Lymph: No cervical or inguinal adenopathy. Neurologic: Grossly intact, no focal deficits, moving all 4 extremities. Psychiatric: Normal mood and affect.  Laboratory Data: Lab Results  Component Value Date   WBC 6.4 02/25/2015   HGB 13.7 02/25/2015   HCT 41.3 02/25/2015   MCV 92.2 02/25/2015   PLT 147.0 (L) 02/25/2015    Lab Results  Component Value Date   CREATININE 0.71 09/02/2015     Lab Results  Component Value Date   HGBA1C 5.5 02/13/2014    Lab Results  Component Value Date   TSH 1.43 02/25/2015       Component Value Date/Time   CHOL 147 09/02/2015 1153   HDL 67.90 09/02/2015 1153   CHOLHDL 2 09/02/2015 1153   VLDL 22.6 09/02/2015 1153   LDLCALC 57 09/02/2015 1153    Lab Results  Component Value Date   AST 17 09/02/2015   Lab Results  Component Value Date   ALT 12 09/02/2015     Pertinent Imaging: CLINICAL DATA:  Pelvic pain for the past 3 weeks. Hematuria in the past but not currently. History of recurrent urinary tract infections.  EXAM: ABDOMEN - 1 VIEW  COMPARISON:  KUB dated Oct 19, 2009  FINDINGS: The colonic  stool burden is mildly increased. No excessive gas is observed. There is no small or large bowel obstructive pattern. There is stool and gas in the rectum. No definite stones overlie the kidneys. There are calcifications in the  upper aspect of the true bony pelvis which likely lie in the iliac arteries. No definite ureteral stones are observed today. There are surgical clips in the gallbladder fossa. There is blunting of the right lateral costophrenic angle. There is degenerative change of the lower lumbar spine and of the left hip.  IMPRESSION: Moderately increased colonic stool burden may reflect constipation in the appropriate clinical setting. No definite urinary tract stones. If the patient's symptoms persist, abdominal and pelvic CT scanning would be a useful next imaging step.   Electronically Signed   By: David  Martinique M.D.   On: 01/13/2016 15:08  CLINICAL DATA:  Two years of bilateral flank pain.  EXAM: RENAL / URINARY TRACT ULTRASOUND COMPLETE  COMPARISON:  Abdominal and pelvic CT scan of Oct 09, 2009  FINDINGS: Right Kidney:  Length: 9.5 cm. The renal cortical echotexture remains lower than that of the adjacent liver. There is no hydronephrosis. No stones are evident.  Left Kidney:  Length: 10.9 cm. The renal cortical echotexture remains lower than that of the adjacent liver. There is no hydronephrosis. No stones are observed.  Bladder:  The partially distended urinary bladder is normal. Bilateral ureteral jets are observed.  IMPRESSION: No hydronephrosis nor other acute abnormality of either kidney.   Electronically Signed   By: David  Martinique M.D.   On: 01/23/2016 14:48  Results for JALEXIS, BREED (MRN [id number]) as of 01/31/2016 19:28  Ref. Range 01/28/2016 15:08  Scan Result Unknown 60   Assessment & Plan:    1. Recurrent UTI  - Reviewed with preventative strategies   - Reminded the patient to contact our office with symptoms  of urinary tract infections   - Reminded patient that if we are unavailable that she should request a catheterized specimen   - KUB and RUS were normal  2. Vaginal atrophy  - Patient will continue the vaginal estrogen cream 3 nights weekly  - Sample of Estrace cream is given  - Patient is encouraged to contact our office for samples when she is unable to acquire the cream  3. Urge incontinence  - BLADDER SCAN AMB NON-IMAGING  - Prescription for Myrbetriq 50 mg as sent to pharmacy  - Patient given samples of her Myrbetriq 50 mg   Return in about 3 months (around 04/28/2016) for PVR and exam.  These notes generated with voice recognition software. I apologize for typographical errors.  Zara Council, [facility] [address] [place], [place] [address] [phone]

## 2016-01-28 NOTE — Progress Notes (Signed)
Daily Session Note  Patient Details  Name: [name] [name] MRN: [id number] Date of Birth: 12/25/1940 Referring Provider:   Flowsheet Row Pulmonary Rehab from 12/16/2015 in [facility] Cardiac and Pulmonary Rehab  Referring Provider  McQuaid, Douglas MD      Encounter Date: 01/28/2016  Check In:     Session Check In - 01/28/16 1142      Check-In   Location [facility]-Cardiac & Pulmonary Rehab   Staff Present  , MA, ACSM RCEP, Exercise Physiologist;Amanda Sommer, BA, ACSM CEP, Exercise Physiologist;Laureen Brown, BS, RRT, Respiratory Therapist   Supervising physician immediately available to respond to emergencies [facility] immediately available ER MD   Physician(s) Drs. Malinda and Williams   Medication changes reported     No   Fall or balance concerns reported    No   Warm-up and Cool-down Performed as group-led instruction   Resistance Training Performed Yes   VAD Patient? No     Pain Assessment   Currently in Pain? No/denies   Multiple Pain Sites No         Goals Met:  Proper associated with RPD/PD & O2 Sat Independence with exercise equipment Using PLB without cueing & demonstrates good technique Exercise tolerated well Strength training completed today  Goals Unmet:  Not Applicable  Comments: Pt able to follow exercise prescription today without complaint.  Will continue to monitor for progression.    Dr. Mark Miller is Medical Director for [facility] and [facility]. 

## 2016-01-30 ENCOUNTER — Encounter: Payer: Medicare Other | Admitting: *Deleted

## 2016-01-30 DIAGNOSIS — J432 Centrilobular emphysema: Secondary | ICD-10-CM | POA: Diagnosis not present

## 2016-01-30 DIAGNOSIS — J449 Chronic obstructive pulmonary disease, unspecified: Secondary | ICD-10-CM

## 2016-01-30 NOTE — Progress Notes (Signed)
Daily Session Note  Patient Details  Name: [name] [name] MRN: [id number] Date of Birth: May 20, 1941 Referring Provider:   Flowsheet Row Pulmonary Rehab from 12/16/2015 in [facility] Cardiac and Pulmonary Rehab  Referring Provider  Simonne Maffucci MD      Encounter Date: 01/30/2016  Check In:     Session Check In - 01/30/16 1052      Check-In   Location [facility]-Cardiac & Pulmonary Rehab   Staff Present Heath Lark, RN, BSN, CCRP;Trimaine Maser, RN, BSN;Stacey Blanch Media, RRT, RCP, Respiratory Therapist   Supervising physician immediately available to respond to emergencies [facility] immediately available ER MD   Physician(s) Dr. Corky Downs and Dr. Burlene Arnt   Medication changes reported     No   Fall or balance concerns reported    No   Warm-up and Cool-down Performed on first and last piece of equipment   Resistance Training Performed Yes   VAD Patient? No     Pain Assessment   Currently in Pain? No/denies         Goals Met:  Proper associated with RPD/PD & O2 Sat Exercise tolerated well  Goals Unmet:  Not Applicable  Comments:     Dr. Emily Filbert is Medical Director for [place] and [facility].

## 2016-02-02 ENCOUNTER — Encounter: Payer: Medicare Other | Admitting: *Deleted

## 2016-02-02 DIAGNOSIS — J432 Centrilobular emphysema: Secondary | ICD-10-CM | POA: Diagnosis not present

## 2016-02-02 DIAGNOSIS — J449 Chronic obstructive pulmonary disease, unspecified: Secondary | ICD-10-CM

## 2016-02-02 NOTE — Progress Notes (Signed)
Daily Session Note  Patient Details  Name: [name] [name] MRN: [id number] Date of Birth: Aug 26, 1940 Referring Provider:   Flowsheet Row Pulmonary Rehab from 12/16/2015 in [facility] Cardiac and Pulmonary Rehab  Referring Provider  Simonne Maffucci MD      Encounter Date: 02/02/2016  Check In:     Session Check In - 02/02/16 1057      Check-In   Location [facility]-Cardiac & Pulmonary Rehab   Staff Present Carson Myrtle, BS, RRT, Respiratory Therapist;Artesha Wemhoff Amedeo Plenty, BS, ACSM CEP, Exercise Physiologist;Amanda Oletta Darter, BA, ACSM CEP, Exercise Physiologist   Supervising physician immediately available to respond to emergencies [facility] immediately available ER MD   Physician(s) Dr. Quentin Cornwall and Dr. Jimmye Norman    Medication changes reported     No   Fall or balance concerns reported    No   Warm-up and Cool-down Performed on first and last piece of equipment   Resistance Training Performed Yes   VAD Patient? No     Pain Assessment   Currently in Pain? No/denies   Multiple Pain Sites No         Goals Met:  Proper associated with RPD/PD & O2 Sat Independence with exercise equipment Exercise tolerated well Personal goals reviewed Strength training completed today  Goals Unmet:  Not Applicable  Comments: 6 min walk assessment was done by patient today. She did show improvement in her distance and all vitals were within acceptable ranges. See 6 min walk data for detailed report.      [place] Name 12/16/15 1544 02/02/16 1058       6 Minute Walk   Phase Initial Mid Program    Distance 1000 feet 1100 feet    Distance % Change  - 10 %    Walk Time 6 minutes 6 minutes    # of Rest Breaks 0 0    MPH 1.89 2.08    METS 2.1 2.6    RPE 17 13    Perceived Dyspnea  2 4    VO2 Peak 7.35 9.09    Symptoms Yes (comment) No    Comments fatigued  -    Resting HR 69 bpm 74 bpm    Resting BP 116/54 104/80    Max Ex. HR 91 bpm 89 bpm    Max Ex. BP 144/56 142/70    2 Minute Post  BP 144/60 132/70      Interval HR   Baseline HR 69 74    1 Minute HR 80 79    2 Minute HR 85 85    3 Minute HR 84 86    4 Minute HR 89 84    5 Minute HR 89 89    6 Minute HR 91 85    2 Minute Post HR 82 79    Interval Heart Rate? Yes Yes      Interval Oxygen   Interval Oxygen? Yes Yes    Baseline Oxygen Saturation % 97 % 95 %    Baseline Liters of Oxygen 4 L 4 L    1 Minute Oxygen Saturation % 95 % 96 %    1 Minute Liters of Oxygen 4 L 4 L    2 Minute Oxygen Saturation % 94 % 93 %    2 Minute Liters of Oxygen 4 L 4 L    3 Minute Oxygen Saturation % 94 % 91 %    3 Minute Liters of Oxygen 4 L 4 L  4 Minute Oxygen Saturation % 95 % 90 %    4 Minute Liters of Oxygen 4 L 4 L    5 Minute Oxygen Saturation % 94 % 91 %    5 Minute Liters of Oxygen 4 L 4 L    6 Minute Oxygen Saturation % 96 % 90 %    6 Minute Liters of Oxygen 4 L 4 L    2 Minute Post Oxygen Saturation % 96 % 98 %    2 Minute Post Liters of Oxygen 4 L 4 L         Dr. Emily Filbert is Medical Director for [place] and [facility].

## 2016-02-04 DIAGNOSIS — J449 Chronic obstructive pulmonary disease, unspecified: Secondary | ICD-10-CM

## 2016-02-04 DIAGNOSIS — J432 Centrilobular emphysema: Secondary | ICD-10-CM | POA: Diagnosis not present

## 2016-02-04 NOTE — Progress Notes (Signed)
Daily Session Note  Patient Details  Name: [name] [name] MRN: [id number] Date of Birth: [birth year] Referring Provider:   Flowsheet Row Pulmonary Rehab from 12/16/2015 in [facility] Cardiac and Pulmonary Rehab  Referring Provider  Simonne Maffucci MD      Encounter Date: 02/04/2016  Check In:     Session Check In - 02/04/16 1217      Check-In   Location [facility]-Cardiac & Pulmonary Rehab   Staff Present Carson Myrtle, BS, RRT, Respiratory Lennie Hummer, MA, ACSM RCEP, Exercise Physiologist;Morio Widen Oletta Darter, BA, ACSM CEP, Exercise Physiologist   Supervising physician immediately available to respond to emergencies [facility] immediately available ER MD   Physician(s) Paduchowitz and Edd Fabian   Medication changes reported     No   Fall or balance concerns reported    No   Warm-up and Cool-down Performed on first and last piece of equipment   Resistance Training Performed Yes   VAD Patient? No     Pain Assessment   Currently in Pain? No/denies   Multiple Pain Sites No           Exercise Prescription Changes - 02/03/16 1400      Exercise Review   Progression Yes     Response to Exercise   Blood Pressure (Admit) 140/80   Blood Pressure (Exercise) 142/70   Blood Pressure (Exit) 124/64   Heart Rate (Admit) 70 bpm   Heart Rate (Exercise) 89 bpm   Heart Rate (Exit) 71 bpm   Oxygen Saturation (Admit) 95 %   Oxygen Saturation (Exercise) 90 %   Oxygen Saturation (Exit) 98 %   Rating of Perceived Exertion (Exercise) 13   Perceived Dyspnea (Exercise) 4   Symptoms none   Comments Home Exercise Guidelines given 01/07/16   Duration Progress to 45 minutes of aerobic exercise without signs/symptoms of physical distress   Intensity THRR unchanged     Progression   Progression Continue to progress workloads to maintain intensity without signs/symptoms of physical distress.   Average METs 2.6     Resistance Training   Training Prescription Yes   Weight 3 lbs   Reps 10-15     Interval Training   Interval Training No     Oxygen   Oxygen Continuous   Liters 4     Treadmill   MPH 1.8   Grade 0.5   Minutes 15   METs 2.5     NuStep   Level 5   Minutes 15   METs 2.1     REL-XR   Level 6   Minutes 15   METs 3.2     Home Exercise Plan   Plans to continue exercise at Home  walking and gym in complex   Frequency Add 2 additional days to program exercise sessions.      Goals Met:  Proper associated with RPD/PD & O2 Sat Independence with exercise equipment Exercise tolerated well Strength training completed today  Goals Unmet:  Not Applicable  Comments: Pt able to follow exercise prescription today without complaint.  Will continue to monitor for progression.    Dr. Emily Filbert is Medical Director for [place] and [facility].

## 2016-02-06 ENCOUNTER — Encounter: Payer: Medicare Other | Admitting: Respiratory Therapy

## 2016-02-06 DIAGNOSIS — J449 Chronic obstructive pulmonary disease, unspecified: Secondary | ICD-10-CM

## 2016-02-06 DIAGNOSIS — J432 Centrilobular emphysema: Secondary | ICD-10-CM | POA: Diagnosis not present

## 2016-02-06 NOTE — Progress Notes (Signed)
Daily Session Note  Patient Details  Name: [name] [name] MRN: [id number] Date of Birth: 1940-07-06 Referring Provider:   Flowsheet Row Pulmonary Rehab from 12/16/2015 in [facility] Cardiac and Pulmonary Rehab  Referring Provider  Simonne Maffucci MD      Encounter Date: 02/06/2016  Check In:     Session Check In - 02/06/16 1138      Check-In   Location [facility]-Cardiac & Pulmonary Rehab   Staff Present Heath Lark, RN, BSN, CCRP;Carroll Enterkin, RN, Michaela Corner, RRT, RCP, Respiratory Therapist   Supervising physician immediately available to respond to emergencies [facility] immediately available ER MD   Physician(s) Dr. Quentin Cornwall & McShane   Medication changes reported     No   Fall or balance concerns reported    No   Warm-up and Cool-down Performed on first and last piece of equipment   Resistance Training Performed Yes   VAD Patient? No     Pain Assessment   Currently in Pain? No/denies         Goals Met:  Proper associated with RPD/PD & O2 Sat Independence with exercise equipment Using PLB without cueing & demonstrates good technique Exercise tolerated well Strength training completed today  Goals Unmet:  Not Applicable  Comments: Pt able to follow exercise prescription today without complaint.  Will continue to monitor for progression.    Dr. Emily Filbert is Medical Director for [place] and [facility].

## 2016-02-09 DIAGNOSIS — J449 Chronic obstructive pulmonary disease, unspecified: Secondary | ICD-10-CM

## 2016-02-09 DIAGNOSIS — J432 Centrilobular emphysema: Secondary | ICD-10-CM | POA: Diagnosis not present

## 2016-02-09 NOTE — Progress Notes (Signed)
Daily Session Note  Patient Details  Name: [name] [name] MRN: [id number] Date of Birth: Jun 13, 1940 Referring Provider:   Flowsheet Row Pulmonary Rehab from 12/16/2015 in [facility] Cardiac and Pulmonary Rehab  Referring Provider  Simonne Maffucci MD      Encounter Date: 02/09/2016  Check In:     Session Check In - 02/09/16 1409      Check-In   Location [facility]-Cardiac & Pulmonary Rehab   Staff Present Earlean Shawl, BS, ACSM CEP, Exercise Physiologist;Narek Kniss Oletta Darter, BA, ACSM CEP, Exercise Physiologist;Laureen Owens Shark, BS, RRT, Respiratory Therapist   Supervising physician immediately available to respond to emergencies [facility] immediately available ER MD   Physician(s) Alfred Levins and Paduchowski   Medication changes reported     No   Fall or balance concerns reported    No   Warm-up and Cool-down Performed as group-led instruction   Resistance Training Performed Yes   VAD Patient? No     Pain Assessment   Currently in Pain? No/denies   Multiple Pain Sites No         Goals Met:  Proper associated with RPD/PD & O2 Sat Independence with exercise equipment Exercise tolerated well Strength training completed today  Goals Unmet:  Not Applicable  Comments: Pt able to follow exercise prescription today without complaint.  Will continue to monitor for progression.    Dr. Emily Filbert is Medical Director for [place] and [facility].

## 2016-02-13 ENCOUNTER — Encounter: Payer: Medicare Other | Admitting: *Deleted

## 2016-02-13 DIAGNOSIS — J449 Chronic obstructive pulmonary disease, unspecified: Secondary | ICD-10-CM

## 2016-02-13 DIAGNOSIS — J432 Centrilobular emphysema: Secondary | ICD-10-CM | POA: Diagnosis not present

## 2016-02-13 NOTE — Progress Notes (Signed)
Daily Session Note  Patient Details  Name: [name] [name] MRN: [id number] Date of Birth: [birth year] Referring Provider:   Flowsheet Row Pulmonary Rehab from 12/16/2015 in [facility] Cardiac and Pulmonary Rehab  Referring Provider  Simonne Maffucci MD      Encounter Date: 02/13/2016  Check In:     Session Check In - 02/13/16 1118      Check-In   Location [facility]-Cardiac & Pulmonary Rehab   Staff Present Heath Lark, RN, BSN, CCRP;Daeron Carreno, RN, BSN;Stacey Blanch Media, RRT, RCP, Respiratory Therapist   Supervising physician immediately available to respond to emergencies [facility] immediately available ER MD   Physician(s) Dr. Mariea Clonts and Dr. Clearnce Hasten   Medication changes reported     No   Fall or balance concerns reported    No   Warm-up and Cool-down Performed as group-led instruction   Resistance Training Performed Yes   VAD Patient? No     Pain Assessment   Currently in Pain? No/denies         Goals Met:  Proper associated with RPD/PD & O2 Sat Exercise tolerated well  Goals Unmet:  Not Applicable  Comments:     Dr. Emily Filbert is Medical Director for [place] and [facility].

## 2016-02-13 NOTE — Progress Notes (Signed)
Daily Session Note  Patient Details  Name: [name] [name] MRN: [id number] Date of Birth: 1940-12-10 Referring Provider:   Flowsheet Row Pulmonary Rehab from 12/16/2015 in [facility] Cardiac and Pulmonary Rehab  Referring Provider  Simonne Maffucci MD      Encounter Date: 02/13/2016  Check In:     Session Check In - 02/13/16 1118      Check-In   Location [facility]-Cardiac & Pulmonary Rehab   Staff Present Heath Lark, RN, BSN, CCRP;Lakeisha Waldrop, RN, BSN;Stacey Blanch Media, RRT, RCP, Respiratory Therapist   Supervising physician immediately available to respond to emergencies [facility] immediately available ER MD   Physician(s) Dr. Mariea Clonts and Dr. Clearnce Hasten   Medication changes reported     No   Fall or balance concerns reported    No   Warm-up and Cool-down Performed as group-led instruction   Resistance Training Performed Yes   VAD Patient? No     Pain Assessment   Currently in Pain? No/denies         Goals Met:  Proper associated with RPD/PD & O2 Sat Exercise tolerated well  Goals Unmet:  Not Applicable  Comments:     Dr. Emily Filbert is Medical Director for [place] and [facility].

## 2016-02-14 DIAGNOSIS — R0902 Hypoxemia: Secondary | ICD-10-CM | POA: Diagnosis not present

## 2016-02-14 DIAGNOSIS — J309 Allergic rhinitis, unspecified: Secondary | ICD-10-CM | POA: Diagnosis not present

## 2016-02-16 ENCOUNTER — Encounter: Payer: Medicare Other | Admitting: *Deleted

## 2016-02-16 DIAGNOSIS — J449 Chronic obstructive pulmonary disease, unspecified: Secondary | ICD-10-CM

## 2016-02-16 DIAGNOSIS — J432 Centrilobular emphysema: Secondary | ICD-10-CM | POA: Diagnosis not present

## 2016-02-16 NOTE — Progress Notes (Signed)
Daily Session Note  Patient Details  Name: [name] [name] MRN: [id number] Date of Birth: Jul 21, 1940 Referring Provider:   Flowsheet Row Pulmonary Rehab from 12/16/2015 in [facility] Cardiac and Pulmonary Rehab  Referring Provider  Simonne Maffucci MD      Encounter Date: 02/16/2016  Check In:     Session Check In - 02/16/16 1231      Check-In   Location [facility]-Cardiac & Pulmonary Rehab   Staff Present Carson Myrtle, BS, RRT, Respiratory Therapist;Wilda Wetherell Amedeo Plenty, BS, ACSM CEP, Exercise Physiologist;Amanda Oletta Darter, BA, ACSM CEP, Exercise Physiologist   Supervising physician immediately available to respond to emergencies [facility] immediately available ER MD   Physician(s) Mariea Clonts and Clearnce Hasten   Medication changes reported     No   Fall or balance concerns reported    No   Warm-up and Cool-down Performed on first and last piece of equipment   Resistance Training Performed Yes   VAD Patient? No     Pain Assessment   Currently in Pain? No/denies   Multiple Pain Sites No         Goals Met:  Proper associated with RPD/PD & O2 Sat Independence with exercise equipment Exercise tolerated well Strength training completed today  Goals Unmet:  Not Applicable  Comments: Pt able to follow exercise prescription today without complaint.  Will continue to monitor for progression.    Dr. Emily Filbert is Medical Director for [place] and [facility].

## 2016-02-18 DIAGNOSIS — J449 Chronic obstructive pulmonary disease, unspecified: Secondary | ICD-10-CM

## 2016-02-18 DIAGNOSIS — J432 Centrilobular emphysema: Secondary | ICD-10-CM | POA: Diagnosis not present

## 2016-02-18 NOTE — Progress Notes (Signed)
Daily Session Note  Patient Details  Name: ANICE WILSHIRE MRN: [id number] Date of Birth: 08-17-1940 Referring Provider:   Flowsheet Row Pulmonary Rehab from 12/16/2015 in [facility] Cardiac and Pulmonary Rehab  Referring Provider  Simonne Maffucci MD      Encounter Date: 02/18/2016  Check In:     Session Check In - 02/18/16 1251      Check-In   Location [facility]-Cardiac & Pulmonary Rehab   Staff Present Heath Lark, RN, BSN, CCRP;Laureen Owens Shark, BS, RRT, Respiratory Dareen Piano, BA, ACSM CEP, Exercise Physiologist   Supervising physician immediately available to respond to emergencies [facility] immediately available ER MD   Physician(s) Alfred Levins and Joni Fears   Medication changes reported     No   Fall or balance concerns reported    No   Warm-up and Cool-down Performed as group-led Location manager Performed Yes   VAD Patient? No     Pain Assessment   Currently in Pain? No/denies   Multiple Pain Sites No         Goals Met:  Proper associated with RPD/PD & O2 Sat Independence with exercise equipment Exercise tolerated well Strength training completed today  Goals Unmet:  Not Applicable  Comments: Pt able to follow exercise prescription today without complaint.  Will continue to monitor for progression.    Dr. Emily Filbert is Medical Director for [place] and [facility].

## 2016-02-20 ENCOUNTER — Encounter: Payer: Medicare Other | Admitting: Respiratory Therapy

## 2016-02-20 DIAGNOSIS — J432 Centrilobular emphysema: Secondary | ICD-10-CM | POA: Diagnosis not present

## 2016-02-20 DIAGNOSIS — J449 Chronic obstructive pulmonary disease, unspecified: Secondary | ICD-10-CM

## 2016-02-20 NOTE — Progress Notes (Signed)
Daily Session Note  Patient Details  Name: [name] [name] MRN: [id number] Date of Birth: [birth year] Referring Provider:   Flowsheet Row Pulmonary Rehab from 12/16/2015 in [facility] Cardiac and Pulmonary Rehab  Referring Provider  Simonne Maffucci MD      Encounter Date: 02/20/2016  Check In:     Session Check In - 02/20/16 1040      Check-In   Location [facility]-Cardiac & Pulmonary Rehab   Staff Present Nyoka Cowden, RN, BSN, Donavan Foil, RN, BSN, CCRP;Ivo Moga Blanch Media, RRT, RCP, Respiratory Therapist   Supervising physician immediately available to respond to emergencies [facility] immediately available ER MD   Physician(s) Dr. Edd Fabian & Marcelene Butte   Medication changes reported     No   Fall or balance concerns reported    No   Warm-up and Cool-down Performed on first and last piece of equipment   Resistance Training Performed Yes   VAD Patient? No     Pain Assessment   Currently in Pain? No/denies         Goals Met:  Proper associated with RPD/PD & O2 Sat Independence with exercise equipment Using PLB without cueing & demonstrates good technique Exercise tolerated well Strength training completed today  Goals Unmet:  Not Applicable  Comments: [name] is wheezing and SOB today.  She had to stop the TM today for SOB 3 mins early.  Decreasing workloads today due to symptoms and she is also coughing up green, thick sputum.  Encouraged her to see her pulmonologist today.    Dr. Emily Filbert is Medical Director for [place] and [facility].

## 2016-02-23 ENCOUNTER — Ambulatory Visit (INDEPENDENT_AMBULATORY_CARE_PROVIDER_SITE_OTHER): Payer: Medicare Other | Admitting: Internal Medicine

## 2016-02-23 ENCOUNTER — Encounter: Payer: Self-pay | Admitting: Internal Medicine

## 2016-02-23 ENCOUNTER — Encounter: Payer: Self-pay | Admitting: Respiratory Therapy

## 2016-02-23 ENCOUNTER — Encounter: Payer: Medicare Other | Attending: Pulmonary Disease

## 2016-02-23 VITALS — BP 128/74 | HR 68 | Temp 98.1°F | Wt 158.8 lb

## 2016-02-23 DIAGNOSIS — J432 Centrilobular emphysema: Secondary | ICD-10-CM | POA: Insufficient documentation

## 2016-02-23 DIAGNOSIS — J449 Chronic obstructive pulmonary disease, unspecified: Secondary | ICD-10-CM

## 2016-02-23 NOTE — Patient Instructions (Signed)

## 2016-02-23 NOTE — Progress Notes (Signed)
Pulmonary Individual Treatment Plan  Patient Details  Name: [name] [name] MRN: [id number] Date of Birth: 10/10/1940 Referring Provider:   Flowsheet Row Pulmonary Rehab from 12/16/2015 in [facility] Cardiac and Pulmonary Rehab  Referring Provider  Simonne Maffucci MD      Initial Encounter Date:  Flowsheet Row Pulmonary Rehab from 12/16/2015 in [facility] Cardiac and Pulmonary Rehab  Date  12/16/15  Referring Provider  Simonne Maffucci MD      Visit Diagnosis: COPD, severe ([place])  Patient's Home Medications on Admission:  Current Outpatient Prescriptions:    albuterol (PROAIR HFA) 108 (90 BASE) MCG/ACT inhaler, Inhale 2 puffs into the lungs every 4 (four) hours as needed., Disp: 1 Inhaler, Rfl: 5   albuterol (PROVENTIL) (2.5 MG/3ML) 0.083% nebulizer solution, Take every 4-6 hours as needed for shortness of breath, Disp: 75 mL, Rfl: 5   arformoterol (BROVANA) 15 MCG/2ML NEBU, Take 2 mLs (15 mcg total) by nebulization 2 (two) times daily., Disp: 120 mL, Rfl: 11   aspirin EC 81 MG tablet, Take 1 tablet (81 mg total) by mouth every Monday, Wednesday, and Friday., Disp: , Rfl:    benzonatate (TESSALON) 100 MG capsule, Take by mouth 3 (three) times daily as needed for cough., Disp: , Rfl:    budesonide (PULMICORT) 0.5 MG/2ML nebulizer solution, Take 2 mLs (0.5 mg total) by nebulization 2 (two) times daily., Disp: 60 mL, Rfl: 12   buPROPion (WELLBUTRIN SR) 150 MG 12 hr tablet, TAKE ONE TABLET BY MOUTH TWICE DAILY, Disp: 60 tablet, Rfl: 11   conjugated estrogens (PREMARIN) vaginal cream, Place 1 Applicatorful vaginally daily. Apply 0.15m (pea-sized amount)  just inside the vaginal introitus with a finger-tip every night for two weeks and then Monday, Wednesday and Friday nights. (Patient not taking: Reported on 01/28/2016), Disp: 30 g, Rfl: 12   CRANBERRY PO, Take 2 tablets by mouth every morning. , Disp: , Rfl:    estradiol (ESTRACE VAGINAL) 0.1 MG/GM vaginal cream, Apply 0.530m(pea-sized amount)   just inside the vaginal introitus with a finger-tip every night for two weeks and then Monday, Wednesday and Friday nights., Disp: 30 g, Rfl: 12   guaifenesin (HUMIBID E) 400 MG TABS tablet, Take 400 mg by mouth every 4 (four) hours., Disp: , Rfl:    losartan (COZAAR) 50 MG tablet, TAKE ONE TABLET BY MOUTH EVERY DAY, Disp: 30 tablet, Rfl: 11   metoprolol tartrate (LOPRESSOR) 25 MG tablet, TAKE ONE TABLET BY MOUTH TWICE DAILY, Disp: 60 tablet, Rfl: 11   MYRBETRIQ 25 MG TB24 tablet, TAKE ONE TABLET BY MOUTH EVERY DAY, Disp: 30 tablet, Rfl: 11   omeprazole (PRILOSEC) 40 MG capsule, TAKE ONE CAPSULE BY MOUTH EVERY DAY, Disp: 30 capsule, Rfl: 11   pravastatin (PRAVACHOL) 80 MG tablet, ONE TABLET BY MOUTH AT BEDTIME, Disp: 90 tablet, Rfl: 1   psyllium (METAMUCIL) 58.6 % powder, Take 1 packet by mouth at bedtime. , Disp: , Rfl:    sertraline (ZOLOFT) 100 MG tablet, Take one tablet in the morning and one-half at bedtime, Disp: 45 tablet, Rfl: 11   SPIRIVA HANDIHALER 18 MCG inhalation capsule, INHALE CONTENTS OF 1 CAPSULE ONCE DAILY., Disp: 30 capsule, Rfl: 5   vitamin B-12 (CYANOCOBALAMIN) 1000 MCG tablet, Take 1,000 mcg by mouth daily., Disp: , Rfl:   Past Medical History: Past Medical History:  Diagnosis Date   Chronic cystitis    recurrent UTI (4+ in last year), on suppressive doxy (uro Cope)   Colon polyp    COPD, severe 01/27/07  Depression with anxiety    Diverticular disease 10/09/09   CT abd no sigmoid mass DDD L5/S1   Dyslipidemia    GERD (gastroesophageal reflux disease)    Hematuria    h/o kidney stones   Hiatal hernia    Hypertension    Left scapholunate ligament tear 2014   found on imaging   Lung cancer ([place]) 1987   Partial RLL Lobectomy.   Osteoarthritis    Osteopenia 08/2011   DEXA: L femur -1.6   Peptic ulcer    Spinal abscess ([place]) 2000   secondary to periodontal disease    Tobacco Use: History  Smoking Status   Former Smoker    Packs/day: 3.00   Years: 34.00   Types: Cigarettes   Quit date: 05/29/1995  Smokeless Tobacco   Never Used    Labs: Recent Review Flowsheet Data    Labs for ITP Cardiac and Pulmonary Rehab Latest Ref Rng & Units 09/06/2011 11/13/2012 02/13/2014 02/25/2015 09/02/2015   Cholestrol 0 - 200 mg/dL 228(H) 188 219(H) 204(H) 147   LDLCALC 0 - 99 mg/dL - 88 108(H) 100(H) 57   LDLDIRECT mg/dL 135.9 - - - -   HDL >39.00 mg/dL 65.50 64.60 71.30 76.90 67.90   Trlycerides 0.0 - 149.0 mg/dL 166.0(H) 175.0(H) 200.0(H) 132.0 113.0   Hemoglobin A1c 4.6 - 6.5 % - - 5.5 - -       ADL UCSD:     Pulmonary Assessment Scores    Row Name 12/16/15 1511 01/30/16 1352       ADL UCSD   ADL Phase Entry Mid    SOB Score total 74 65    Rest 0 0    Walk 0 0    Stairs 0 5    Bath 2 0    Dress 4 3    Shop 5 3       Pulmonary Function Assessment:     Pulmonary Function Assessment - 12/16/15 1511      Initial Spirometry Results   Comments Test Date: 12/16/2015      Exercise Target Goals:    Exercise Program Goal: Individual exercise prescription set with THRR, safety & activity barriers. Participant demonstrates ability to understand and report RPE using BORG scale, to self-measure pulse accurately, and to acknowledge the importance of the exercise prescription.  Exercise Prescription Goal: Starting with aerobic activity 30 plus minutes a day, 3 days per week for initial exercise prescription. Provide home exercise prescription and guidelines that participant acknowledges understanding prior to discharge.  Activity Barriers & Risk Stratification:     Activity Barriers & Cardiac Risk Stratification - 12/16/15 1507      Activity Barriers & Cardiac Risk Stratification   Activity Barriers Deconditioning;Shortness of Breath   Cardiac Risk Stratification Moderate      6 Minute Walk:     6 Minute Walk    Row Name 12/16/15 1544 02/02/16 1058       6 Minute Walk   Phase Initial Mid  Program    Distance 1000 feet 1100 feet    Distance % Change  -- 10 %    Walk Time 6 minutes 6 minutes    # of Rest Breaks 0 0    MPH 1.89 2.08    METS 2.1 2.6    RPE 17 13    Perceived Dyspnea  2 4    VO2 Peak 7.35 9.09    Symptoms Yes (comment) No    Comments fatigued  --  Resting HR 69 bpm 74 bpm    Resting BP 116/54 104/80    Max Ex. HR 91 bpm 89 bpm    Max Ex. BP 144/56 142/70    2 Minute Post BP 144/60 132/70      Interval HR   Baseline HR 69 74    1 Minute HR 80 79    2 Minute HR 85 85    3 Minute HR 84 86    4 Minute HR 89 84    5 Minute HR 89 89    6 Minute HR 91 85    2 Minute Post HR 82 79    Interval Heart Rate? Yes Yes      Interval Oxygen   Interval Oxygen? Yes Yes    Baseline Oxygen Saturation % 97 % 95 %    Baseline Liters of Oxygen 4 L 4 L    1 Minute Oxygen Saturation % 95 % 96 %    1 Minute Liters of Oxygen 4 L 4 L    2 Minute Oxygen Saturation % 94 % 93 %    2 Minute Liters of Oxygen 4 L 4 L    3 Minute Oxygen Saturation % 94 % 91 %    3 Minute Liters of Oxygen 4 L 4 L    4 Minute Oxygen Saturation % 95 % 90 %    4 Minute Liters of Oxygen 4 L 4 L    5 Minute Oxygen Saturation % 94 % 91 %    5 Minute Liters of Oxygen 4 L 4 L    6 Minute Oxygen Saturation % 96 % 90 %    6 Minute Liters of Oxygen 4 L 4 L    2 Minute Post Oxygen Saturation % 96 % 98 %    2 Minute Post Liters of Oxygen 4 L 4 L       Initial Exercise Prescription:     Initial Exercise Prescription - 12/16/15 1500      Date of Initial Exercise RX and Referring Provider   Date 12/16/15   Referring Provider Simonne Maffucci MD     Oxygen   Oxygen Continuous   Liters 4     Treadmill   MPH 1.4   Grade 0   Minutes 15   METs 2.07     NuStep   Level 1   Minutes 15   METs 2     Recumbant Elliptical   Level 1   RPM 50   Minutes 15   METs 2     Prescription Details   Frequency (times per week) 3   Duration Progress to 45 minutes of aerobic exercise without  signs/symptoms of physical distress     Intensity   THRR 40-80% of Max Heartrate 99-131   Ratings of Perceived Exertion 11-13   Perceived Dyspnea 0-4     Progression   Progression Continue to progress workloads to maintain intensity without signs/symptoms of physical distress.     Resistance Training   Training Prescription Yes   Weight 2 lbs   Reps 10-12      Perform Capillary Blood Glucose checks as needed.  Exercise Prescription Changes:     Exercise Prescription Changes    Row Name 12/24/15 1400 01/06/16 1400 01/07/16 1400 01/21/16 1400 02/03/16 1400     Exercise Review   Progression  -- Yes Yes Yes Yes     Response to Exercise   Blood Pressure (Admit) 138/68 142/68  --  134/62 140/80   Blood Pressure (Exercise) 142/68 148/70  -- 124/58 142/70   Blood Pressure (Exit) 122/68 110/62  -- 106/60 124/64   Heart Rate (Admit) 67 bpm 64 bpm  -- 65 bpm 70 bpm   Heart Rate (Exercise) 82 bpm 83 bpm  -- 77 bpm 89 bpm   Heart Rate (Exit) 67 bpm 70 bpm  -- 58 bpm 71 bpm   Oxygen Saturation (Admit) 97 % 97 %  -- 90 % 95 %   Oxygen Saturation (Exercise) 94 % 96 %  -- 98 % 90 %   Oxygen Saturation (Exit) 99 % 97 %  -- 99 % 98 %   Rating of Perceived Exertion (Exercise) 13 13  -- 13 13   Perceived Dyspnea (Exercise) 2 4  -- 4 4   Symptoms none none none none none   Comments  --  -- Home Exercise Guidelines given 01/07/16 Home Exercise Guidelines given 01/07/16 Home Exercise Guidelines given 01/07/16   Duration Progress to 45 minutes of aerobic exercise without signs/symptoms of physical distress Progress to 45 minutes of aerobic exercise without signs/symptoms of physical distress Progress to 45 minutes of aerobic exercise without signs/symptoms of physical distress Progress to 45 minutes of aerobic exercise without signs/symptoms of physical distress Progress to 45 minutes of aerobic exercise without signs/symptoms of physical distress   Intensity THRR unchanged THRR unchanged THRR  unchanged THRR unchanged THRR unchanged     Progression   Progression Continue to progress workloads to maintain intensity without signs/symptoms of physical distress. Continue to progress workloads to maintain intensity without signs/symptoms of physical distress. Continue to progress workloads to maintain intensity without signs/symptoms of physical distress. Continue to progress workloads to maintain intensity without signs/symptoms of physical distress. Continue to progress workloads to maintain intensity without signs/symptoms of physical distress.   Average METs 2.1 2.64 2.64 2.2 2.6     Resistance Training   Training Prescription Yes Yes Yes Yes Yes   Weight 2 lbs 2 lbs 2 lbs 3 lbs 3 lbs   Reps 10-12 10-15 10-15 10-15 10-15     Interval Training   Interval Training No No No No No     Oxygen   Oxygen Continuous Continuous Continuous Continuous Continuous   Liters 4 4 4 4 4      Treadmill   MPH 1.6 1.7 1.7 1.8 1.8   Grade 0 0.5 0.5 0.5 0.5   Minutes 15 15 15 15 15    METs 2.23 2.3 2.3 2.5 2.5     NuStep   Level 4 2 2 5 5    Minutes 15 15 15 15 15    METs 1.8 2 2  1.9 2.1     REL-XR   Level 1 2 2 6 6    Minutes 15 15 15 15 15    METs 1.8 3.4 3.4 2.2 3.2     Home Exercise Plan   Plans to continue exercise at  --  -- Home  walking and gym in complex Home  walking and gym in complex Home  walking and gym in complex   Frequency  --  -- Add 1 additional day to program exercise sessions. Add 1 additional day to program exercise sessions. Add 2 additional days to program exercise sessions.   [place] Name 02/18/16 1200             Exercise Review   Progression Yes         Response to Exercise   Blood Pressure (Admit) 132/64  Blood Pressure (Exercise) 158/68       Blood Pressure (Exit) 128/68       Heart Rate (Admit) 72 bpm       Heart Rate (Exercise) 93 bpm       Heart Rate (Exit) 73 bpm       Oxygen Saturation (Admit) 97 %       Oxygen Saturation (Exercise) 95 %        Oxygen Saturation (Exit) 98 %       Rating of Perceived Exertion (Exercise) 13       Perceived Dyspnea (Exercise) 4       Symptoms none       Duration Progress to 45 minutes of aerobic exercise without signs/symptoms of physical distress       Intensity THRR unchanged         Progression   Progression Continue to progress workloads to maintain intensity without signs/symptoms of physical distress.         Resistance Training   Training Prescription Yes       Weight 3       Reps 10-15         Interval Training   Interval Training No         Oxygen   Oxygen Continuous       Liters 4         Treadmill   MPH 1.9       Grade 0.5       Minutes 15       METs 2.59         NuStep   Level 5       Minutes 15         REL-XR   Level 6       Minutes 15          Exercise Comments:     Exercise Comments    Row Name 12/16/15 1548 12/24/15 1404 01/06/16 1456 01/07/16 1114 01/21/16 1437   Exercise Comments Lorita wants to improve her ability to walk to be able to go shopping again. First full day of exercise!  Patient was oriented to gym and equipment including functions, settings, policies, and procedures.  Patient's individual exercise prescription and treatment plan were reviewed.  All starting workloads were established based on the results of the 6 minute walk test done at initial orientation visit.  The plan for exercise progression was also introduced and progression will be customized based on patient's performance and goals. Louanne is off to a great start with her exercise.  She is up to a full 15 min on each piece of equipment.  We will continue to monitor for progression. Reviewed home exercise with pt today.  Pt plans to walk and go to complex gym for exercise.  Reviewed THR, pulse, RPE, sign and symptoms, O2 use, and when to call 911 or MD.  Also discussed weather considerations and indoor options.  Pt voiced understanding. Kiyla is doing well with exercise. She continues to move  up on her workloads.  She is up to level 6 on the XR.  We will continue to monitor her progression.   [place] Name 02/02/16 1106 02/03/16 1437 02/18/16 1255       Exercise Comments 6 min walk assessment was done by patient today. She did show improvement in her distance and all vitals were within acceptable ranges. See 6 min walk data for detailed report.  Lougenia has been doing well with exercise.  Her walk test results prove that exercise is helping!!  We will continue to monitor her progress. Kania is progressing well with exercise.        Discharge Exercise Prescription (Final Exercise Prescription Changes):     Exercise Prescription Changes - 02/18/16 1200      Exercise Review   Progression Yes     Response to Exercise   Blood Pressure (Admit) 132/64   Blood Pressure (Exercise) 158/68   Blood Pressure (Exit) 128/68   Heart Rate (Admit) 72 bpm   Heart Rate (Exercise) 93 bpm   Heart Rate (Exit) 73 bpm   Oxygen Saturation (Admit) 97 %   Oxygen Saturation (Exercise) 95 %   Oxygen Saturation (Exit) 98 %   Rating of Perceived Exertion (Exercise) 13   Perceived Dyspnea (Exercise) 4   Symptoms none   Duration Progress to 45 minutes of aerobic exercise without signs/symptoms of physical distress   Intensity THRR unchanged     Progression   Progression Continue to progress workloads to maintain intensity without signs/symptoms of physical distress.     Resistance Training   Training Prescription Yes   Weight 3   Reps 10-15     Interval Training   Interval Training No     Oxygen   Oxygen Continuous   Liters 4     Treadmill   MPH 1.9   Grade 0.5   Minutes 15   METs 2.59     NuStep   Level 5   Minutes 15     REL-XR   Level 6   Minutes 15       Nutrition:  Target Goals: Understanding of nutrition guidelines, daily intake of sodium <1587m, cholesterol <2027m calories 30% from fat and 7% or less from saturated fats, daily to have 5 or more servings of fruits and  vegetables.  Biometrics:    Nutrition Therapy Plan and Nutrition Goals:     Nutrition Therapy & Goals - 12/16/15 1515      Nutrition Therapy   Diet Ms Bechard prefers not to meet with the dietitian. They are residents at a retirement community and eat their lunch at their dining facility.      Nutrition Discharge: Rate Your Plate Scores:   Psychosocial: Target Goals: Acknowledge presence or absence of depression, maximize coping skills, provide positive support system. Participant is able to verbalize types and ability to use techniques and skills needed for reducing stress and depression.  Initial Review & Psychosocial Screening:     Initial Psych Review & Screening - 12/16/15 [place]   Comments Ms Rasheed has good support from her husband. She states She does not have depression, but medicates with Zoloft and Wellbutrin. Due to her shortness of breath, Ms Taulbee misses certain activities she use to perform.     Barriers   Psychosocial barriers to participate in program The patient should benefit from training in stress management and relaxation.     Screening Interventions   Interventions Encouraged to exercise;Program counselor consult      Quality of Life Scores:     Quality of Life - 12/16/15 1524      Quality of Life Scores   Health/Function Pre 19.63 %   Socioeconomic Pre 20.33 %   Psych/Spiritual Pre 19.29 %   Family Pre 21 %   GLOBAL Pre 19.88 %      PHQ-9: Recent Review Flowsheet Data  Depression screen [facility] 2/9 12/16/2015 03/04/2015 02/18/2014 11/13/2012   Decreased Interest 2 0  0 1   Down, Depressed, Hopeless 1 1 0 1   PHQ - 2 Score 3 1 0 2   Altered sleeping 0 - - 0   Tired, decreased energy 2 - - 2   Change in appetite 0 - - 0   Feeling bad or failure about yourself  0 - - 1   Trouble concentrating 0 - - 0   Moving slowly or fidgety/restless 0 - - 0   Suicidal thoughts 1 - - 0   PHQ-9  Score 6 - - 5   Difficult doing work/chores Very difficult - - -      Psychosocial Evaluation and Intervention:     Psychosocial Evaluation - 12/31/15 1058      Psychosocial Evaluation & Interventions   Interventions Encouraged to exercise with the program and follow exercise prescription   Comments Counselor met with Ms. Seoane (Ms. Mamie Nick) today for initial psychosocial evaluation.  She is a 75 year old who has struggled with COPD since 2011.  She has a strong support system with a spouse of 15 years and adult children who live close by.  Ms. Mamie Nick has bladder problems in addition to Pulmonary issues and will be seeing a Dr. for this tomorrow.  She has a good appetite and sleeps well with the help of Benadryl nightly (due to itching).  Ms. Mamie Nick has a history of depression and reports her symptoms are adequately treated with the help of the current medications she is taking.  She is typically in a positive mood and has minimal stress in her life.  Ms. Mamie Nick has goals to lose some inches and get her energy levels up while in this program.  She lives in a retirement community and will continue exercising with her spouse there upon completion of this program.  Counselor will follow with Ms. P while in this program.        Psychosocial Re-Evaluation:     Psychosocial Re-Evaluation    [place] Name 01/05/16 1128 01/28/16 1218           Psychosocial Re-Evaluation   Comments Follow up with Ms. Mamie Nick today stating she is already seeing some progress since coming into this program.  She has more stamina and is able to do more.  She continues to sleep well and is typically positive.  Counselor commended Ms. P for her hard work and commitment to exercise consistently.   Counselor follow up with Ms. Chivers today reporting improved breathing and less anxiety since coming into this program.  She states she generally feels better and has more energy and her spouse has remarked noticing this in her.  Counselor commended Ms.  P for her progress made and will continue to follow with her throughout the course of this program.          Education: Education Goals: Education classes will be provided on a weekly basis, covering required topics. Participant will state understanding/return demonstration of topics presented.  Learning Barriers/Preferences:     Learning Barriers/Preferences - 12/16/15 1507      Learning Barriers/Preferences   Learning Barriers None   Learning Preferences Group Instruction;Individual Instruction;Pictoral;Skilled Demonstration;Verbal Instruction;Video;Written Material      Education Topics: Initial Evaluation Education: - Verbal, written and demonstration of respiratory meds, RPE/PD scales, oximetry and breathing techniques. Instruction on use of nebulizers and MDIs: cleaning and proper use, rinsing mouth with steroid doses and importance  of monitoring MDI activations. Flowsheet Row Pulmonary Rehab from 02/06/2016 in [facility] Cardiac and Pulmonary Rehab  Date  12/16/15  Educator  LB  Instruction Review Code  2- meets goals/outcomes      General Nutrition Guidelines/Fats and Fiber: -Group instruction provided by verbal, written material, models and posters to present the general guidelines for heart healthy nutrition. Gives an explanation and review of dietary fats and fiber.   Controlling Sodium/Reading Food Labels: -Group verbal and written material supporting the discussion of sodium use in heart healthy nutrition. Review and explanation with models, verbal and written materials for utilization of the food label.   Exercise Physiology & Risk Factors: - Group verbal and written instruction with models to review the exercise physiology of the cardiovascular system and associated critical values. Details cardiovascular disease risk factors and the goals associated with each risk factor. Flowsheet Row Pulmonary Rehab from 02/06/2016 in [facility] Cardiac and Pulmonary Rehab  Date  01/14/16   Educator  AS  Instruction Review Code  2- meets goals/outcomes      Aerobic Exercise & Resistance Training: - Gives group verbal and written discussion on the health impact of inactivity. On the components of aerobic and resistive training programs and the benefits of this training and how to safely progress through these programs.   Flexibility, Balance, General Exercise Guidelines: - Provides group verbal and written instruction on the benefits of flexibility and balance training programs. Provides general exercise guidelines with specific guidelines to those with heart or lung disease. Demonstration and skill practice provided.   Stress Management: - Provides group verbal and written instruction about the health risks of elevated stress, cause of high stress, and healthy ways to reduce stress.   Depression: - Provides group verbal and written instruction on the correlation between heart/lung disease and depressed mood, treatment options, and the stigmas associated with seeking treatment. Flowsheet Row Pulmonary Rehab from 02/06/2016 in [facility] Cardiac and Pulmonary Rehab  Date  01/28/16  Educator  [facility]  Instruction Review Code  2- meets goals/outcomes      Exercise & Equipment Safety: - Individual verbal instruction and demonstration of equipment use and safety with use of the equipment.   Infection Prevention: - Provides verbal and written material to individual with discussion of infection control including proper hand washing and proper equipment cleaning during exercise session. Flowsheet Row Pulmonary Rehab from 02/06/2016 in [facility] Cardiac and Pulmonary Rehab  Date  12/16/15  Educator  LB  Instruction Review Code  2- meets goals/outcomes      Falls Prevention: - Provides verbal and written material to individual with discussion of falls prevention and safety. Flowsheet Row Pulmonary Rehab from 02/06/2016 in [facility] Cardiac and Pulmonary Rehab  Date  12/16/15  Educator  LB   Instruction Review Code  2- meets goals/outcomes      Diabetes: - Individual verbal and written instruction to review signs/symptoms of diabetes, desired ranges of glucose level fasting, after meals and with exercise. Advice that pre and post exercise glucose checks will be done for 3 sessions at entry of program.   Chronic Lung Diseases: - Group verbal and written instruction to review new updates, new respiratory medications, new advancements in procedures and treatments. Provide informative websites and "800" numbers of self-education.   Lung Procedures: - Group verbal and written instruction to describe testing methods done to diagnose lung disease. Review the outcome of test results. Describe the treatment choices: Pulmonary Function Tests, ABGs and oximetry.   Energy Conservation: - Provide  group verbal and written instruction for methods to conserve energy, plan and organize activities. Instruct on pacing techniques, use of adaptive equipment and posture/positioning to relieve shortness of breath.   Triggers: - Group verbal and written instruction to review types of environmental controls: home humidity, furnaces, filters, dust mite/pet prevention, HEPA vacuums. To discuss weather changes, air quality and the benefits of nasal washing. Flowsheet Row Pulmonary Rehab from 02/06/2016 in [facility] Cardiac and Pulmonary Rehab  Date  02/02/16  Educator  LB  Instruction Review Code  2- meets goals/outcomes      Exacerbations: - Group verbal and written instruction to provide: warning signs, infection symptoms, calling MD promptly, preventive modes, and value of vaccinations. Review: effective airway clearance, coughing and/or vibration techniques. Create an Sports administrator. Flowsheet Row Pulmonary Rehab from 02/06/2016 in [facility] Cardiac and Pulmonary Rehab  Date  01/05/16  Educator  LB  Instruction Review Code  2- meets goals/outcomes      Oxygen: - Individual and group verbal and written  instruction on oxygen therapy. Includes supplement oxygen, available portable oxygen systems, continuous and intermittent flow rates, oxygen safety, concentrators, and Medicare reimbursement for oxygen. Flowsheet Row Pulmonary Rehab from 02/06/2016 in [facility] Cardiac and Pulmonary Rehab  Date  12/16/15  Educator  LB  Instruction Review Code  2- meets goals/outcomes      Respiratory Medications: - Group verbal and written instruction to review medications for lung disease. Drug class, frequency, complications, importance of spacers, rinsing mouth after steroid MDI's, and proper cleaning methods for nebulizers. Flowsheet Row Pulmonary Rehab from 02/06/2016 in [facility] Cardiac and Pulmonary Rehab  Date  12/16/15  Educator  LB  Instruction Review Code  2- meets goals/outcomes      AED/CPR: - Group verbal and written instruction with the use of models to demonstrate the basic use of the AED with the basic ABC's of resuscitation. Flowsheet Row Pulmonary Rehab from 02/06/2016 in [facility] Cardiac and Pulmonary Rehab  Date  12/26/15  Educator  CE  Instruction Review Code  2- meets goals/outcomes      Breathing Retraining: - Provides individuals verbal and written instruction on purpose, frequency, and proper technique of diaphragmatic breathing and pursed-lipped breathing. Applies individual practice skills. Flowsheet Row Pulmonary Rehab from 02/06/2016 in [facility] Cardiac and Pulmonary Rehab  Date  12/24/15  Educator  LB  Instruction Review Code  2- meets goals/outcomes      Anatomy and Physiology of the Lungs: - Group verbal and written instruction with the use of models to provide basic lung anatomy and physiology related to function, structure and complications of lung disease. Flowsheet Row Pulmonary Rehab from 02/06/2016 in [facility] Cardiac and Pulmonary Rehab  Date  01/16/16  Educator  Beg[place]  Instruction Review Code  2- meets goals/outcomes      Heart Failure: - Group verbal and written instruction  on the basics of heart failure: signs/symptoms, treatments, explanation of ejection fraction, enlarged heart and cardiomyopathy. Flowsheet Row Pulmonary Rehab from 02/06/2016 in [facility] Cardiac and Pulmonary Rehab  Date  01/23/16  Educator  CE  Instruction Review Code  2- meets goals/outcomes      Sleep Apnea: - Individual verbal and written instruction to review Obstructive Sleep Apnea. Review of risk factors, methods for diagnosing and types of masks and machines for OSA.   Anxiety: - Provides group, verbal and written instruction on the correlation between heart/lung disease and anxiety, treatment options, and management of anxiety.   Relaxation: - Provides group, verbal and written instruction about  the benefits of relaxation for patients with heart/lung disease. Also provides patients with examples of relaxation techniques. Flowsheet Row Pulmonary Rehab from 02/06/2016 in [facility] Cardiac and Pulmonary Rehab  Date  12/31/15  Educator  [facility]  Instruction Review Code  2- Meets goals/outcomes      Knowledge Questionnaire Score:     Knowledge Questionnaire Score - 12/16/15 1508      Knowledge Questionnaire Score   Pre Score 8/10       Core Components/Risk Factors/Patient Goals at Admission:     Personal Goals and Risk Factors at Admission - 12/16/15 1517      Core Components/Risk Factors/Patient Goals on Admission   Sedentary Yes  Residents at [facility], so access to their gym facility   Intervention Provide advice, education, support and counseling about physical activity/exercise needs.;Develop an individualized exercise prescription for aerobic and resistive training based on initial evaluation findings, risk stratification, comorbidities and participant's personal goals.   Expected Outcomes Achievement of increased cardiorespiratory fitness and enhanced flexibility, muscular endurance and strength shown through measurements of functional capacity and personal statement of  participant.   Increase Strength and Stamina Yes   Intervention Provide advice, education, support and counseling about physical activity/exercise needs.;Develop an individualized exercise prescription for aerobic and resistive training based on initial evaluation findings, risk stratification, comorbidities and participant's personal goals.   Expected Outcomes Achievement of increased cardiorespiratory fitness and enhanced flexibility, muscular endurance and strength shown through measurements of functional capacity and personal statement of participant.   Improve shortness of breath with ADL's Yes   Intervention Provide education, individualized exercise plan and daily activity instruction to help decrease symptoms of SOB with activities of daily living.   Expected Outcomes Short Term: Achieves a reduction of symptoms when performing activities of daily living.   Develop more efficient breathing techniques such as purse lipped breathing and diaphragmatic breathing; and practicing self-pacing with activity Yes   Intervention Provide education, demonstration and support about specific breathing techniuqes utilized for more efficient breathing. Include techniques such as pursed lipped breathing, diaphragmatic breathing and self-pacing activity.   Expected Outcomes Short Term: Participant will be able to demonstrate and use breathing techniques as needed throughout daily activities.   Increase knowledge of respiratory medications and ability to use respiratory devices properly  Yes  Uses SVN with Albuterol, Brovana, and Pulmicort; uses Spiriva; Albuterol MDI and spacer given. Oxygen 4-5l/m; portable concentrator   Intervention Provide education and demonstration as needed of appropriate use of medications, inhalers, and oxygen therapy.   Expected Outcomes Short Term: Achieves understanding of medications use. Understands that oxygen is a medication prescribed by physician. Demonstrates appropriate use of  inhaler and oxygen therapy.   Personal Goal Other Yes   Personal Goal Able to go shopping again   Intervention Provide exercise prescription and education to increase functional capacity and improved shortness of breath   Expected Outcomes Able to gain strength and stamina to shopping.      Core Components/Risk Factors/Patient Goals Review:      Goals and Risk Factor Review    Row Name 12/24/15 1401 01/09/16 1057 01/09/16 1406 01/09/16 1408 01/09/16 1410     Core Components/Risk Factors/Patient Goals Review   Personal Goals Review Develop more efficient breathing techniques such as purse lipped breathing and diaphragmatic breathing and practicing self-pacing with activity. Weight Management/Obesity Improve shortness of breath with ADL's Increase knowledge of respiratory medications and ability to use respiratory devices properly. Develop more efficient breathing techniques such as purse lipped  breathing and diaphragmatic breathing and practicing self-pacing with activity.   Review Reivewed PLB with pt today.  Reviewed technique and how it can help with reducing shortness of breath. Lynisha said it is hard for her to lose weight. Latrelle said "it will probably help me to lose weight with the exercise in [facility]. Discussed writing down her food intake as one option when she asked me how I keep my weight down. Ms. Stevens is seeing progress in her ADL's.  She says that it is easier for her to walk farther and with less SOB since she has started Pulmonary Rehab.  Ms. Arment takes Proair, Brovana, Pulmicort and Spiriva for her respiratory disease.  She has a spacer to use with the Proair.  She understands how to take all of her medications and does not have any questions at this time.  Ms. Stenberg demonstrated the proper technique for PLB while exercising.   Expected Outcomes Able to use PLB independently without queuing WEight loss of 5lbs. Continued exercise should help her progress with ADL's  and decrease her SOB even further.  Continuing to take her medications as prescribed and when needed with help Ms. Redcay decrease her exacerbations and SOB. Understanding the proper technique will help her recover from SOB, exaberations and anxiety if these issues occur.    [place] Name 01/12/16 1106 02/09/16 1411 02/13/16 1517 02/13/16 1519 02/13/16 1522     Core Components/Risk Factors/Patient Goals Review   Personal Goals Review Increase Strength and Stamina;Sedentary Sedentary;Weight Management/Obesity;Increase Strength and Stamina Improve shortness of breath with ADL's Increase knowledge of respiratory medications and ability to use respiratory devices properly. Develop more efficient breathing techniques such as purse lipped breathing and diaphragmatic breathing and practicing self-pacing with activity.   Review Home exercise was reviewed with the patient. She has not yet started home exercise but has checked into a fitness complex at her residence. She reported improvements in strength and stamina, as well as having a better attitude.  Amayah is feeling much better overall; not as depressed.  She states she stil doesnt have a lot of energy - has trouble if she sits too long after meals - sometimes short of breath.  She wants to work on her waist. Caroline is finding it easier to walk longer distances without so much SOB. Theta takes Albuterol MDI, Brovana, Pulmicort, Spiriva and ProAir at home for her respiratory issues.  She has a spacer for her MDIs and knowes to rinse her mouth after taking the seroid inhalers.  She also wears 4L O2 at all times.  Lorrena demonstrates proper technique of PLB.   Expected Outcomes Patient will start home exercise at the complex at her residence, increasing her frequency of exercise, which will aid in her continued progress of increasing her strength and stamina.  Arlett will continue to improve her fitness and see imoprovement in energy levels. Continued strength training and  cardiovascular exercise will help Janaye's SOB improve with ADLs.  Taking her respiratory medications as prescribed by her doctor will help decrease Deaja's SOB and chances of exacerbation.  PLB will help Taisia regain her breath during exercise.       Core Components/Risk Factors/Patient Goals at Discharge (Final Review):      Goals and Risk Factor Review - 02/13/16 1522      Core Components/Risk Factors/Patient Goals Review   Personal Goals Review Develop more efficient breathing techniques such as purse lipped breathing and diaphragmatic breathing and practicing self-pacing with activity.   Review  Dalayza demonstrates proper technique of PLB.   Expected Outcomes PLB will help Meghann regain her breath during exercise.       ITP Comments:     ITP Comments    Row Name 01/12/16 1113 02/18/16 1255         ITP Comments Ms. Brumett lost her balance during stretching exercises and did fall. She fell back on her buttocks and her head hit several strands of soft resistive bands and bruised her right forarm.  She repored that she felt ok. Safety report will be filed. Clorissa is progressing well with exercise.         Comments: 30 day note review

## 2016-02-23 NOTE — Progress Notes (Signed)
HPI  Pt presents to the clinic today with c/o cough and shortness of breath. This seems to be an ongoing issue with her COPD but has gotten worse in the last week. The cough is mostly non productive, but she occasionally gets up green mucous. She is short of breath, but no more short of breath than usual. She denies runny nose, ear pain or sore throat. She has not tried anything OTC. She takes Spiriva and Albuterol as prescribed. She is currently in cardiac rehab. She follows with pulmonology- last note reviewed. She has not had sick contacts that she is aware of.  Review of Systems      Past Medical History:  Diagnosis Date  . Chronic cystitis    recurrent UTI (4+ in last year), on suppressive doxy (uro Cope)  . Colon polyp   . 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 Depression with anxiety   . Diverticular disease 10/09/09   CT abd no sigmoid mass DDD L5/S1  . Dyslipidemia   . GERD (gastroesophageal reflux disease)   . Hematuria    h/o kidney stones  . Hiatal hernia   . Hypertension   . Left scapholunate ligament tear 2014   found on imaging  . Lung cancer ([place]) 1987   Partial RLL Lobectomy.  . Osteoarthritis   . Osteopenia 08/2011   DEXA: L femur -1.6  . Peptic ulcer   . Spinal abscess ([place]) 2000   secondary to periodontal disease    Family History  Problem Relation Age of Onset  . Pancreatitis Mother   . Heart disease Father   . Hypertension Father   . Emphysema Father   . Arthritis Brother   . Hypertension Brother   . Cancer Maternal Aunt     ovarian cancer  . Kidney disease Neg Hx     Social History   Social History  . Marital status: Married    Spouse name: N/A  . Number of children: 2  . Years of education: N/A   Occupational History  . Retired Retired    prior Glass blower/designer at [employer] History Main Topics  . Smoking status: Former Smoker    Packs/day: 3.00    Years: 34.00    Types: Cigarettes    Quit date: 05/29/1995  . Smokeless  tobacco: Never Used  . Alcohol use 0.0 oz/week     Comment: wine daily 2-3 glasses/day  . Drug use: No  . Sexual activity: Not on file   Other Topics Concern  . Not on file   Social History Narrative   Caffeine: 4 cups coffee   Remarried for the third time, 2nd husband died of heart attack. Two stepsons   Activity: Walks for exercise   Diet: good water, fruits/vegetables daily      Advanced directed - would want HCPOA to be husband, Karlton Lemon. Does not have set up - requests handout.    Allergies  Allergen Reactions  . Keflex [Cephalexin] Shortness Of Breath  . Penicillins     REACTION: hives     Constitutional: Positive fatigue. Denies headache, fever or  abrupt weight changes.  HEENT: . Denies eye redness, eye pain, pressure behind the eyes, facial pain, nasal congestion, ear pain, ringing in the ears, wax buildup, runny nose or sore throat. Respiratory: Positive cough and shortness of breath. Denies difficulty breathing.  Cardiovascular: Denies chest pain, chest tightness, palpitations or swelling in the hands or feet.   No other specific  complaints in a complete review of systems (except as listed in HPI above).  Objective:   BP 128/74   Pulse 68   Temp 98.1 F (36.7 C) (Oral)   Wt 158 lb 12 oz (72 kg)   SpO2 97%   BMI 28.12 kg/m   Wt Readings from Last 3 Encounters:  02/23/16 158 lb 12 oz (72 kg)  01/28/16 157 lb 6.4 oz (71.4 kg)  01/21/16 157 lb (71.2 kg)     General: Appears her stated age, chronically ill appearing in NAD. HEENT: Head: normal shape and size; Eyes: sclera white, no icterus, conjunctiva pink; Ears: Tm's gray and intact, normal light reflex; Throat/Mouth: + PND. Teeth present, mucosa pink and moist, no exudate noted, no lesions or ulcerations noted.  Neck: No cervical lymphadenopathy.  Cardiovascular: Normal rate and rhythm. S1,S2 noted.  No murmur, rubs or gallops noted.  Pulmonary/Chest: Slightly increased effort with diminished expiratory  phase. No respiratory distress. No wheezes, rales or ronchi noted.      Assessment & Plan:   COPD:  Get some rest and drink plenty of water Continue Spiriva and Albuterol Mucinex 600 mg every 12 hours x 3 days Return precautions discussed  RTC as needed or if symptoms persist.   Webb Silversmith, NP

## 2016-02-25 DIAGNOSIS — J449 Chronic obstructive pulmonary disease, unspecified: Secondary | ICD-10-CM

## 2016-02-25 DIAGNOSIS — J432 Centrilobular emphysema: Secondary | ICD-10-CM | POA: Diagnosis not present

## 2016-02-25 NOTE — Progress Notes (Signed)
Daily Session Note  Patient Details  Name: [name] [name] MRN: [id number] Date of Birth: [birth year] Referring Provider:   Flowsheet Row Pulmonary Rehab from 12/16/2015 in [facility] Cardiac and Pulmonary Rehab  Referring Provider  Simonne Maffucci MD      Encounter Date: 02/25/2016  Check In:     Session Check In - 02/25/16 1211      Check-In   Location [facility]-Cardiac & Pulmonary Rehab   Staff Present Carson Myrtle, BS, RRT, Respiratory Lennie Hummer, MA, ACSM RCEP, Exercise Physiologist;Mayumi Summerson Oletta Darter, BA, ACSM CEP, Exercise Physiologist   Supervising physician immediately available to respond to emergencies [facility] immediately available ER MD   Physician(s) Edd Fabian and Corky Downs   Medication changes reported     No   Fall or balance concerns reported    No   Warm-up and Cool-down Performed as group-led Location manager Performed Yes   VAD Patient? No     Pain Assessment   Currently in Pain? No/denies   Multiple Pain Sites No         Goals Met:  Proper associated with RPD/PD & O2 Sat Independence with exercise equipment Exercise tolerated well Strength training completed today  Goals Unmet:  Not Applicable  Comments: Pt able to follow exercise prescription today without complaint.  Will continue to monitor for progression.    Dr. Emily Filbert is Medical Director for [place] and [facility] Pulmonary Rehabilitation.

## 2016-02-27 ENCOUNTER — Encounter: Payer: Medicare Other | Admitting: *Deleted

## 2016-02-27 DIAGNOSIS — J432 Centrilobular emphysema: Secondary | ICD-10-CM | POA: Diagnosis not present

## 2016-02-27 DIAGNOSIS — J449 Chronic obstructive pulmonary disease, unspecified: Secondary | ICD-10-CM

## 2016-02-27 NOTE — Progress Notes (Signed)
Daily Session Note  Patient Details  Name: [name] [name] MRN: [id number] Date of Birth: 1941/04/21 Referring Provider:   Flowsheet Row Pulmonary Rehab from 12/16/2015 in [facility] Cardiac and Pulmonary Rehab  Referring Provider  Simonne Maffucci MD      Encounter Date: 02/27/2016  Check In:     Session Check In - 02/27/16 1043      Check-In   Location [facility]-Cardiac & Pulmonary Rehab   Staff Present Carson Myrtle, BS, RRT, Respiratory Therapist;Zaray Gatchel, RN, Levie Heritage, MA, ACSM RCEP, Exercise Physiologist   Supervising physician immediately available to respond to emergencies [facility] immediately available ER MD   Physician(s) Dr. Izola Price and Dr. Reita Cliche   Medication changes reported     No   Fall or balance concerns reported    No   Warm-up and Cool-down Performed as group-led instruction   VAD Patient? No     Pain Assessment   Currently in Pain? No/denies         Goals Met:  Proper associated with RPD/PD & O2 Sat Exercise tolerated well  Goals Unmet:  Not Applicable  Comments:     Dr. Emily Filbert is Medical Director for [place] and [facility] Pulmonary Rehabilitation.

## 2016-03-01 ENCOUNTER — Encounter: Payer: Medicare Other | Admitting: *Deleted

## 2016-03-01 VITALS — Ht 63.25 in | Wt 155.5 lb

## 2016-03-01 DIAGNOSIS — J449 Chronic obstructive pulmonary disease, unspecified: Secondary | ICD-10-CM

## 2016-03-01 DIAGNOSIS — J432 Centrilobular emphysema: Secondary | ICD-10-CM | POA: Diagnosis not present

## 2016-03-01 NOTE — Progress Notes (Signed)
Daily Session Note  Patient Details  Name: [name] [name] MRN: [id number] Date of Birth: 1941-01-29 Referring Provider:   Flowsheet Row Pulmonary Rehab from 12/16/2015 in [facility] Cardiac and Pulmonary Rehab  Referring Provider  Simonne Maffucci MD      Encounter Date: 03/01/2016  Check In:     Session Check In - 03/01/16 1139      Check-In   Location [facility]-Cardiac & Pulmonary Rehab   Staff Present Carson Myrtle, BS, RRT, Respiratory Therapist;Yelina Sarratt Amedeo Plenty, BS, ACSM CEP, Exercise Physiologist;Amanda Oletta Darter, BA, ACSM CEP, Exercise Physiologist   Supervising physician immediately available to respond to emergencies [facility] immediately available ER MD   Physician(s) Dr. Burlene Arnt and Dr. Marcelene Butte   Medication changes reported     No   Fall or balance concerns reported    No   Warm-up and Cool-down Performed on first and last piece of equipment   Resistance Training Performed Yes   VAD Patient? No     Pain Assessment   Currently in Pain? No/denies   Multiple Pain Sites No         Goals Met:  Proper associated with RPD/PD & O2 Sat Independence with exercise equipment Exercise tolerated well Personal goals reviewed Strength training completed today  Goals Unmet:  Not Applicable  Comments: Pt able to follow exercise prescription today without complaint.  Will continue to monitor for progression.  6 min walk done today. See 6 min data sheet for detailed report on progress. Upon graduation Norva plans to exercise at the fitness facility in her community.       [place] Name 12/16/15 1544 02/02/16 1058 03/01/16 1237     6 Minute Walk   Phase Initial Mid Program Discharge   Distance 1000 feet 1100 feet 1100 feet   Distance % Change  - 10 % 10 %   Walk Time 6 minutes 6 minutes 6 minutes   # of Rest Breaks 0 0 0   MPH 1.89 2.08 2.08   METS 2.1 2.6 2.6   RPE 17 13 13    Perceived Dyspnea  2 4 4    VO2 Peak 7.35 9.09 9.09   Symptoms Yes (comment) No No   Comments fatigued  -  -   Resting HR 69 bpm 74 bpm 68 bpm   Resting BP 116/54 104/80 138/80   Max Ex. HR 91 bpm 89 bpm 88 bpm   Max Ex. BP 144/56 142/70 148/82   2 Minute Post BP 144/60 132/70 122/80     Interval HR   Baseline HR 69 74 68   1 Minute HR 80 79 85   2 Minute HR 85 85 81   3 Minute HR 84 86 85   4 Minute HR 89 84 85   5 Minute HR 89 89 87   6 Minute HR 91 85 88   2 Minute Post HR 82 79 79   Interval Heart Rate? Yes Yes Yes     Interval Oxygen   Interval Oxygen? Yes Yes Yes   Baseline Oxygen Saturation % 97 % 95 % 99 %   Baseline Liters of Oxygen 4 L 4 L 4 L   1 Minute Oxygen Saturation % 95 % 96 % 96 %   1 Minute Liters of Oxygen 4 L 4 L 4 L   2 Minute Oxygen Saturation % 94 % 93 % 94 %   2 Minute Liters of Oxygen 4 L 4 L 4 L  3 Minute Oxygen Saturation % 94 % 91 % 93 %   3 Minute Liters of Oxygen 4 L 4 L 4 L   4 Minute Oxygen Saturation % 95 % 90 % 93 %   4 Minute Liters of Oxygen 4 L 4 L 4 L   5 Minute Oxygen Saturation % 94 % 91 % 92 %   5 Minute Liters of Oxygen 4 L 4 L 4 L   6 Minute Oxygen Saturation % 96 % 90 % 91 %   6 Minute Liters of Oxygen 4 L 4 L 88 L   2 Minute Post Oxygen Saturation % 96 % 98 % 96 %   2 Minute Post Liters of Oxygen 4 L 4 L 4 L         Dr. Emily Filbert is Medical Director for [place] and [facility] Pulmonary Rehabilitation.

## 2016-03-02 ENCOUNTER — Other Ambulatory Visit: Payer: Self-pay | Admitting: Family Medicine

## 2016-03-03 ENCOUNTER — Encounter: Payer: Medicare Other | Admitting: *Deleted

## 2016-03-03 DIAGNOSIS — J432 Centrilobular emphysema: Secondary | ICD-10-CM | POA: Diagnosis not present

## 2016-03-03 DIAGNOSIS — J449 Chronic obstructive pulmonary disease, unspecified: Secondary | ICD-10-CM

## 2016-03-03 NOTE — Progress Notes (Signed)
Daily Session Note  Patient Details  Name: [name] [name] MRN: [id number] Date of Birth: 1940-06-10 Referring Provider:   Flowsheet Row Pulmonary Rehab from 12/16/2015 in [facility] Cardiac and Pulmonary Rehab  Referring Provider  Simonne Maffucci MD      Encounter Date: 03/03/2016  Check In:     Session Check In - 03/03/16 1050      Check-In   Location [facility]-Cardiac & Pulmonary Rehab   Staff Present Alberteen Sam, MA, ACSM RCEP, Exercise Physiologist;Laureen Owens Shark, BS, RRT, Respiratory Therapist;Carroll Enterkin, RN, BSN   Supervising physician immediately available to respond to emergencies [facility] immediately available ER MD   Physician(s) Drs. Lord and [employer]   Medication changes reported     No   Fall or balance concerns reported    No   Warm-up and Cool-down Performed as group-led Location manager Performed Yes   VAD Patient? No     Pain Assessment   Currently in Pain? No/denies   Multiple Pain Sites No         Goals Met:  Proper associated with RPD/PD & O2 Sat Independence with exercise equipment Using PLB without cueing & demonstrates good technique Exercise tolerated well Strength training completed today  Goals Unmet:  Not Applicable  Comments: Pt able to follow exercise prescription today without complaint.  Will continue to monitor for progression.    Dr. Emily Filbert is Medical Director for [place] and [facility] Pulmonary Rehabilitation.

## 2016-03-05 DIAGNOSIS — J432 Centrilobular emphysema: Secondary | ICD-10-CM | POA: Diagnosis not present

## 2016-03-05 DIAGNOSIS — J449 Chronic obstructive pulmonary disease, unspecified: Secondary | ICD-10-CM

## 2016-03-05 NOTE — Progress Notes (Signed)
Daily Session Note  Patient Details  Name: [name] [name] MRN: [id number] Date of Birth: 08-03-1940 Referring Provider:   Flowsheet Row Pulmonary Rehab from 12/16/2015 in [facility] Cardiac and Pulmonary Rehab  Referring Provider  Simonne Maffucci MD      Encounter Date: 03/05/2016  Check In:     Session Check In - 03/05/16 1215      Check-In   Location [facility]-Cardiac & Pulmonary Rehab   Staff Present Heath Lark, RN, BSN, CCRP;Carroll Enterkin, RN, Vickki Hearing, BA, ACSM CEP, Exercise Physiologist   Supervising physician immediately available to respond to emergencies [facility] immediately available ER MD   Physician(s) Corky Downs and Archie Balboa   Medication changes reported     No   Fall or balance concerns reported    No   Warm-up and Cool-down Performed as group-led Location manager Performed Yes   VAD Patient? No     Pain Assessment   Currently in Pain? No/denies         Goals Met:  Proper associated with RPD/PD & O2 Sat Independence with exercise equipment Exercise tolerated well Strength training completed today  Goals Unmet:  Not Applicable  Comments: Pt able to follow exercise prescription today without complaint.  Will continue to monitor for progression.    Dr. Emily Filbert is Medical Director for [place] and [facility].

## 2016-03-08 ENCOUNTER — Encounter: Payer: Medicare Other | Admitting: *Deleted

## 2016-03-08 DIAGNOSIS — J432 Centrilobular emphysema: Secondary | ICD-10-CM | POA: Diagnosis not present

## 2016-03-08 DIAGNOSIS — J449 Chronic obstructive pulmonary disease, unspecified: Secondary | ICD-10-CM

## 2016-03-08 NOTE — Progress Notes (Signed)
Daily Session Note  Patient Details  Name: [name] [name] MRN: [id number] Date of Birth: 1940-09-23 Referring Provider:   Flowsheet Row Pulmonary Rehab from 12/16/2015 in [facility] Cardiac and Pulmonary Rehab  Referring Provider  Simonne Maffucci MD      Encounter Date: 03/08/2016  Check In:     Session Check In - 03/08/16 1018      Check-In   Location [facility]-Cardiac & Pulmonary Rehab   Staff Present Carson Myrtle, BS, RRT, Respiratory Therapist;Fawne Hughley Amedeo Plenty, BS, ACSM CEP, Exercise Physiologist;Amanda Oletta Darter, BA, ACSM CEP, Exercise Physiologist   Supervising physician immediately available to respond to emergencies [facility] immediately available ER MD   Physician(s) Joni Fears and Jimmye Norman   Medication changes reported     No   Fall or balance concerns reported    No   Warm-up and Cool-down Performed on first and last piece of equipment   Resistance Training Performed Yes   VAD Patient? No     Pain Assessment   Currently in Pain? No/denies   Multiple Pain Sites No         Goals Met:  Proper associated with RPD/PD & O2 Sat Independence with exercise equipment Exercise tolerated well Strength training completed today  Goals Unmet:  Not Applicable  Comments: Pt able to follow exercise prescription today without complaint.  Will continue to monitor for progression.    Dr. Emily Filbert is Medical Director for [place] and [facility].

## 2016-03-10 DIAGNOSIS — J432 Centrilobular emphysema: Secondary | ICD-10-CM | POA: Diagnosis not present

## 2016-03-10 DIAGNOSIS — J449 Chronic obstructive pulmonary disease, unspecified: Secondary | ICD-10-CM

## 2016-03-10 NOTE — Progress Notes (Signed)
Daily Session Note  Patient Details  Name: [name] [name] MRN: [id number] Date of Birth: [birth year] Referring Provider:   Flowsheet Row Pulmonary Rehab from 12/16/2015 in [facility] Cardiac and Pulmonary Rehab  Referring Provider  Simonne Maffucci MD      Encounter Date: 03/10/2016  Check In:     Session Check In - 03/10/16 1151      Check-In   Location [facility]-Cardiac & Pulmonary Rehab   Staff Present Carson Myrtle, BS, RRT, Respiratory Lennie Hummer, MA, ACSM RCEP, Exercise Physiologist;Pattrick Bady Oletta Darter, BA, ACSM CEP, Exercise Physiologist   Supervising physician immediately available to respond to emergencies [facility] immediately available ER MD   Physician(s) Quentin Cornwall and Joni Fears   Medication changes reported     No   Fall or balance concerns reported    No   Warm-up and Cool-down Performed as group-led Location manager Performed Yes   VAD Patient? No     Pain Assessment   Currently in Pain? No/denies   Multiple Pain Sites No         Goals Met:  Proper associated with RPD/PD & O2 Sat Independence with exercise equipment Exercise tolerated well Strength training completed today  Goals Unmet:  Not Applicable  Comments: Pt able to follow exercise prescription today without complaint.  Will continue to monitor for progression.    Dr. Emily Filbert is Medical Director for [place] and [facility].

## 2016-03-12 ENCOUNTER — Encounter: Payer: Medicare Other | Admitting: *Deleted

## 2016-03-12 DIAGNOSIS — J449 Chronic obstructive pulmonary disease, unspecified: Secondary | ICD-10-CM

## 2016-03-12 DIAGNOSIS — J432 Centrilobular emphysema: Secondary | ICD-10-CM | POA: Diagnosis not present

## 2016-03-12 NOTE — Progress Notes (Signed)
Daily Session Note  Patient Details  Name: [name] [name] MRN: [id number] Date of Birth: 07-26-1940 Referring Provider:   Flowsheet Row Pulmonary Rehab from 12/16/2015 in [facility] Cardiac and Pulmonary Rehab  Referring Provider  Simonne Maffucci MD      Encounter Date: 03/12/2016  Check In:     Session Check In - 03/12/16 1016      Check-In   Location [facility]-Cardiac & Pulmonary Rehab   Staff Present Alberteen Sam, MA, ACSM RCEP, Exercise Physiologist;Amanda Oletta Darter, BA, ACSM CEP, Exercise Physiologist;Carroll Enterkin, RN, BSN   Supervising physician immediately available to respond to emergencies [facility] immediately available ER MD   Physician(s) Drs. Quentin Cornwall and Quale   Medication changes reported     No   Fall or balance concerns reported    No   Warm-up and Cool-down Performed as group-led Location manager Performed Yes   VAD Patient? No     Pain Assessment   Currently in Pain? No/denies   Multiple Pain Sites No         Goals Met:  Proper associated with RPD/PD & O2 Sat Independence with exercise equipment Using PLB without cueing & demonstrates good technique Exercise tolerated well Strength training completed today  Goals Unmet:  Not Applicable  Comments: Pt able to follow exercise prescription today without complaint.  Will continue to monitor for progression.    Dr. Emily Filbert is Medical Director for [place] and [facility].

## 2016-03-15 ENCOUNTER — Encounter: Payer: Self-pay | Admitting: Family Medicine

## 2016-03-15 ENCOUNTER — Ambulatory Visit (INDEPENDENT_AMBULATORY_CARE_PROVIDER_SITE_OTHER): Payer: Medicare Other | Admitting: Family Medicine

## 2016-03-15 VITALS — BP 130/60 | HR 84 | Temp 98.2°F | Wt 157.8 lb

## 2016-03-15 DIAGNOSIS — J432 Centrilobular emphysema: Secondary | ICD-10-CM | POA: Diagnosis not present

## 2016-03-15 DIAGNOSIS — G500 Trigeminal neuralgia: Secondary | ICD-10-CM | POA: Diagnosis not present

## 2016-03-15 DIAGNOSIS — J449 Chronic obstructive pulmonary disease, unspecified: Secondary | ICD-10-CM

## 2016-03-15 DIAGNOSIS — J309 Allergic rhinitis, unspecified: Secondary | ICD-10-CM | POA: Diagnosis not present

## 2016-03-15 DIAGNOSIS — R0902 Hypoxemia: Secondary | ICD-10-CM | POA: Diagnosis not present

## 2016-03-15 NOTE — Progress Notes (Signed)
Pre visit review using our clinic review tool, if applicable. No additional management support is needed unless otherwise documented below in the visit note. 

## 2016-03-15 NOTE — Patient Instructions (Signed)
I think you have recurrent trigeminal neuralgia Treat with restarting gabapentin '100mg'$  twice daily. You can increase to three times daily if needed. If ongoing symptoms despite this, let me know.  Trigeminal Neuralgia Trigeminal neuralgia is a nerve disorder that causes attacks of severe facial pain. The attacks last from a few seconds to several minutes. They can happen for days, weeks, or months and then go away for months or years. Trigeminal neuralgia is also called tic douloureux. CAUSES This condition is caused by damage to a nerve in the face that is called the trigeminal nerve. An attack can be triggered by:  Talking.  Chewing.  Putting on makeup.  Washing your face.  Shaving your face.  Brushing your teeth.  Touching your face. RISK FACTORS This condition is more likely to develop in:  Women.  People who are 49 years of age or older. SYMPTOMS The main symptom of this condition is pain in the jaw, lips, eyes, nose, scalp, forehead, and face. The pain may be intense, stabbing, electric, or shock-like. DIAGNOSIS This condition is diagnosed with a physical exam. A CT scan or MRI may be done to rule out other conditions that can cause facial pain. TREATMENT This condition may be treated with:  Avoiding the things that trigger your attacks.  Pain medicine.  Surgery. This may be done in severe cases if other medical treatment does not provide relief. HOME CARE INSTRUCTIONS  Take over-the-counter and prescription medicines only as told by your health care provider.  If you wish to get pregnant, talk with your health care provider before you start trying to get pregnant.  Avoid the things that trigger your attacks. It may help to:  Chew on the unaffected side of your mouth.  Avoid touching your face.  Avoid blasts of hot or cold air. SEEK MEDICAL CARE IF:  Your pain medicine is not helping.  You develop new, unexplained symptoms, such as:  Double  vision.  Facial weakness.  Changes in hearing or balance.  You become pregnant. SEEK IMMEDIATE MEDICAL CARE IF:  Your pain is unbearable, and your pain medicine does not help.   This information is not intended to replace advice given to you by your health care provider. Make sure you discuss any questions you have with your health care provider.   Document Released: 05/07/2000 Document Revised: 01/29/2015 Document Reviewed: 09/02/2014 Elsevier Interactive Patient Education [employer].

## 2016-03-15 NOTE — Progress Notes (Signed)
Daily Session Note  Patient Details  Name: ESTELLA MALATESTA MRN: [id number] Date of Birth: [birth year] Referring Provider:   Flowsheet Row Pulmonary Rehab from 12/16/2015 in [facility] Cardiac and Pulmonary Rehab  Referring Provider  Simonne Maffucci MD      Encounter Date: 03/15/2016  Check In:     Session Check In - 03/15/16 1142      Check-In   Location [facility]-Cardiac & Pulmonary Rehab   Staff Present Carson Myrtle, BS, RRT, Respiratory Therapist;Kelly Amedeo Plenty, BS, ACSM CEP, Exercise Physiologist;Amanda Oletta Darter, BA, ACSM CEP, Exercise Physiologist   Supervising physician immediately available to respond to emergencies [facility] immediately available ER MD   Physician(s) Kerman Passey and Yow   Medication changes reported     No   Fall or balance concerns reported    No   Warm-up and Cool-down Performed as group-led instruction   Resistance Training Performed Yes   VAD Patient? No     Pain Assessment   Currently in Pain? No/denies   Multiple Pain Sites No         Goals Met:  Proper associated with RPD/PD & O2 Sat Independence with exercise equipment Exercise tolerated well Strength training completed today  Goals Unmet:  Not Applicable  Comments: Pt able to follow exercise prescription today without complaint.  Will continue to monitor for progression.    Dr. Emily Filbert is Medical Director for [place] and [facility].

## 2016-03-15 NOTE — Progress Notes (Signed)
BP 130/60   Pulse 84   Temp 98.2 F (36.8 C) (Oral)   Wt 157 lb 12 oz (71.6 kg)   BMI 27.72 kg/m    CC: headache Subjective:    [name], female    DOB: Jan 06, 1941, 75 y.o.   MRN: [id number]  HPI: [name] [name] is a 75 y.o. female presenting on 03/15/2016 for Headache (shooting pain on right side since yesterday)   H/o R trigeminal neuralgia seen 11/18/2015. Treated with gabapentin '100mg'$  bid and symptoms improved so she stopped medication.   Symptoms recurring. Describes lightning shock electrical sensation across R head from posterior ear to top of head. Chills, no fevers. No hearing changes. No slurred speech, facial weakness, or AMS/confusion. Took '200mg'$  gabapentin last night - slept well.   Continues pulm rehab at [facility].   Relevant past medical, surgical, family and social history reviewed and updated as indicated. Interim medical history since our last visit reviewed. Allergies and medications reviewed and updated. Current Outpatient Prescriptions on File Prior to Visit  Medication Sig  . albuterol (PROAIR HFA) 108 (90 BASE) MCG/ACT inhaler Inhale 2 puffs into the lungs every 4 (four) hours as needed.  Marland Kitchen albuterol (PROVENTIL) (2.5 MG/3ML) 0.083% nebulizer solution Take every 4-6 hours as needed for shortness of breath  . arformoterol (BROVANA) 15 MCG/2ML NEBU Take 2 mLs (15 mcg total) by nebulization 2 (two) times daily.  Marland Kitchen aspirin EC 81 MG tablet Take 1 tablet (81 mg total) by mouth every Monday, Wednesday, and Friday.  . benzonatate (TESSALON) 100 MG capsule Take by mouth 3 (three) times daily as needed for cough.  . budesonide (PULMICORT) 0.5 MG/2ML nebulizer solution Take 2 mLs (0.5 mg total) by nebulization 2 (two) times daily.  Marland Kitchen buPROPion (WELLBUTRIN SR) 150 MG 12 hr tablet TAKE ONE TABLET BY MOUTH TWICE DAILY  . conjugated estrogens (PREMARIN) vaginal cream Place 1 Applicatorful vaginally daily. Apply 0.'5mg'$  (pea-sized amount)  just inside the  vaginal introitus with a finger-tip every night for two weeks and then Monday, Wednesday and Friday nights.  . CRANBERRY PO Take 2 tablets by mouth every morning.   Marland Kitchen estradiol (ESTRACE VAGINAL) 0.1 MG/GM vaginal cream Apply 0.'5mg'$  (pea-sized amount)  just inside the vaginal introitus with a finger-tip every night for two weeks and then Monday, Wednesday and Friday nights.  Marland Kitchen guaifenesin (HUMIBID E) 400 MG TABS tablet Take 400 mg by mouth every 4 (four) hours.  Marland Kitchen losartan (COZAAR) 50 MG tablet TAKE ONE TABLET BY MOUTH EVERY DAY  . metoprolol tartrate (LOPRESSOR) 25 MG tablet TAKE ONE TABLET BY MOUTH TWICE DAILY  . MYRBETRIQ 25 MG TB24 tablet TAKE ONE TABLET BY MOUTH EVERY DAY  . omeprazole (PRILOSEC) 40 MG capsule TAKE ONE CAPSULE BY MOUTH EVERY DAY  . pravastatin (PRAVACHOL) 80 MG tablet ONE TABLET BY MOUTH AT BEDTIME  . psyllium (METAMUCIL) 58.6 % powder Take 1 packet by mouth at bedtime.   . sertraline (ZOLOFT) 100 MG tablet Take one tablet in the morning and one-half at bedtime  . SPIRIVA HANDIHALER 18 MCG inhalation capsule INHALE CONTENTS OF 1 CAPSULE ONCE DAILY.  . vitamin B-12 (CYANOCOBALAMIN) 1000 MCG tablet Take 1,000 mcg by mouth daily.   No current facility-administered medications on file prior to visit.     Review of Systems Per HPI unless specifically indicated in ROS section     Objective:    BP 130/60   Pulse 84   Temp 98.2 F (36.8 C) (Oral)  Wt 157 lb 12 oz (71.6 kg)   BMI 27.72 kg/m   Wt Readings from Last 3 Encounters:  03/15/16 157 lb 12 oz (71.6 kg)  03/01/16 155 lb 8 oz (70.5 kg)  02/23/16 158 lb 12 oz (72 kg)    Physical Exam  Constitutional: She is oriented to person, place, and time. She appears well-developed and well-nourished. No distress.  HENT:  Head: Normocephalic and atraumatic.  Right Ear: Hearing, tympanic membrane, external ear and ear canal normal.  Left Ear: Hearing, tympanic membrane, external ear and ear canal normal.  Nose: Nose  normal. No mucosal edema or rhinorrhea. Right sinus exhibits no maxillary sinus tenderness and no frontal sinus tenderness. Left sinus exhibits no maxillary sinus tenderness and no frontal sinus tenderness.  Mouth/Throat: Uvula is midline, oropharynx is clear and moist and mucous membranes are normal. No oropharyngeal exudate, posterior oropharyngeal edema, posterior oropharyngeal erythema or tonsillar abscesses.  Eyes: Conjunctivae and EOM are normal. Pupils are equal, round, and reactive to light. No scleral icterus.  Neck: Normal range of motion. Neck supple.  Cardiovascular: Normal rate, regular rhythm, normal heart sounds and intact distal pulses.   No murmur heard. Pulmonary/Chest: Effort normal and breath sounds normal. No respiratory distress. She has no wheezes. She has no rales.  Musculoskeletal: She exhibits no edema.  Lymphadenopathy:    She has no cervical adenopathy.  Neurological: She is alert and oriented to person, place, and time. No cranial nerve deficit.  CN 2-12 intact EOMI  Skin: Skin is warm and dry. No rash noted.  Psychiatric: She has a normal mood and affect.  Nursing note and vitals reviewed.     Assessment & Plan:   Problem List Items Addressed This Visit    Trigeminal neuralgia of right side of face - Primary    Recurrent. Anticipate V3 distribution. Handout provided. Restart gabapentin '100mg'$  BID to TID. Update with effect.       Other Visit Diagnoses   None.      Follow up plan: Return if symptoms worsen or fail to improve.  Ria Bush, MD

## 2016-03-15 NOTE — Assessment & Plan Note (Addendum)
Recurrent. Anticipate V3 distribution. Handout provided. Restart gabapentin '100mg'$  BID to TID. Update with effect.

## 2016-03-16 NOTE — Progress Notes (Signed)
Discharge Summary  Patient Details  Name: [name] [name] MRN: [id number] Date of Birth: 1941-01-30 Referring Provider:   Flowsheet Row Pulmonary Rehab from 12/16/2015 in [facility]  Referring Provider  Simonne Maffucci MD       Number of Visits: 36  Reason for Discharge:  Patient reached a stable level of exercise. Patient independent in their exercise.  Smoking History:  History  Smoking Status  . Former Smoker  . Packs/day: 3.00  . Years: 34.00  . Types: Cigarettes  . Quit date: 05/29/1995  Smokeless Tobacco  . Never Used    Diagnosis:  No diagnosis found.  ADL [facility]:     Pulmonary Assessment Scores    Row Name 12/16/15 1511 01/30/16 1352 03/10/16 1536     ADL [facility]   ADL Phase Entry Mid Exit   SOB Score total 74 65 64   Rest 0 0 0   Walk 0 0 1   Stairs 0 5 5   Bath 2 0 1   Dress '4 3 3   '$ Shop '5 3 4      '$ Initial Exercise Prescription:     Initial Exercise Prescription - 12/16/15 1500      Date of Initial Exercise RX and Referring Provider   Date 12/16/15   Referring Provider Simonne Maffucci MD     Oxygen   Oxygen Continuous   Liters 4     Treadmill   MPH 1.4   Grade 0   Minutes 15   METs 2.07     NuStep   Level 1   Minutes 15   METs 2     Recumbant Elliptical   Level 1   RPM 50   Minutes 15   METs 2     Prescription Details   Frequency (times per week) 3   Duration Progress to 45 minutes of aerobic exercise without signs/symptoms of physical distress     Intensity   THRR 40-80% of Max Heartrate 99-131   Ratings of Perceived Exertion 11-13   Perceived Dyspnea 0-4     Progression   Progression Continue to progress workloads to maintain intensity without signs/symptoms of physical distress.     Resistance Training   Training Prescription Yes   Weight 2 lbs   Reps 10-12      Discharge Exercise Prescription (Final Exercise Prescription Changes):     Exercise Prescription Changes - 03/04/16 1100       Exercise Review   Progression Yes     Response to Exercise   Blood Pressure (Admit) 158/72   Blood Pressure (Exercise) 160/70   Blood Pressure (Exit) 122/74   Heart Rate (Admit) 59 bpm   Heart Rate (Exercise) 98 bpm   Heart Rate (Exit) 60 bpm   Oxygen Saturation (Admit) 98 %   Oxygen Saturation (Exercise) 93 %   Oxygen Saturation (Exit) 98 %   Rating of Perceived Exertion (Exercise) 13   Perceived Dyspnea (Exercise) 3   Symptoms none   Duration Progress to 45 minutes of aerobic exercise without signs/symptoms of physical distress   Intensity THRR unchanged     Progression   Progression Continue to progress workloads to maintain intensity without signs/symptoms of physical distress.     Resistance Training   Training Prescription Yes   Weight 3   Reps 10-15     Interval Training   Interval Training No     Oxygen   Oxygen Continuous   Liters 4  Treadmill   MPH 2   Grade 1.5   Minutes 15   METs 2.94     NuStep   Level 5   Minutes 15   METs 2     REL-XR   Level 6   Minutes 15   METs 2.1      Functional Capacity:     6 Minute Walk    Row Name 12/16/15 1544 02/02/16 1058 03/01/16 1237     6 Minute Walk   Phase Initial Mid Program Discharge   Distance 1000 feet 1100 feet 1100 feet   Distance % Change  - 10 % 10 %   Walk Time 6 minutes 6 minutes 6 minutes   # of Rest Breaks 0 0 0   MPH 1.89 2.08 2.08   METS 2.1 2.6 2.6   RPE '17 13 13   '$ Perceived Dyspnea  '2 4 4   '$ VO2 Peak 7.35 9.09 9.09   Symptoms Yes (comment) No No   Comments fatigued  -  -   Resting HR 69 bpm 74 bpm 68 bpm   Resting BP 116/54 104/80 138/80   Max Ex. HR 91 bpm 89 bpm 88 bpm   Max Ex. BP 144/56 142/70 148/82   2 Minute Post BP 144/60 132/70 122/80     Interval HR   Baseline HR 69 74 68   1 Minute HR 80 79 85   2 Minute HR 85 85 81   3 Minute HR 84 86 85   4 Minute HR 89 84 85   5 Minute HR 89 89 87   6 Minute HR 91 85 88   2 Minute Post HR 82 79 79   Interval Heart  Rate? Yes Yes Yes     Interval Oxygen   Interval Oxygen? Yes Yes Yes   Baseline Oxygen Saturation % 97 % 95 % 99 %   Baseline Liters of Oxygen 4 L 4 L 4 L   1 Minute Oxygen Saturation % 95 % 96 % 96 %   1 Minute Liters of Oxygen 4 L 4 L 4 L   2 Minute Oxygen Saturation % 94 % 93 % 94 %   2 Minute Liters of Oxygen 4 L 4 L 4 L   3 Minute Oxygen Saturation % 94 % 91 % 93 %   3 Minute Liters of Oxygen 4 L 4 L 4 L   4 Minute Oxygen Saturation % 95 % 90 % 93 %   4 Minute Liters of Oxygen 4 L 4 L 4 L   5 Minute Oxygen Saturation % 94 % 91 % 92 %   5 Minute Liters of Oxygen 4 L 4 L 4 L   6 Minute Oxygen Saturation % 96 % 90 % 91 %   6 Minute Liters of Oxygen 4 L 4 L 88 L   2 Minute Post Oxygen Saturation % 96 % 98 % 96 %   2 Minute Post Liters of Oxygen 4 L 4 L 4 L      Psychological, QOL, Others - Outcomes: PHQ 2/9: Depression screen [facility] 2/9 03/10/2016 12/16/2015 03/04/2015 02/18/2014 11/13/2012  Decreased Interest 0 2 0 0 1  Down, Depressed, Hopeless 0 1 1 0 1  PHQ - 2 Score 0 3 1 0 2  Altered sleeping 1 0 - - 0  Tired, decreased energy 1 2 - - 2  Change in appetite 0 0 - - 0  Feeling bad or  failure about yourself  0 0 - - 1  Trouble concentrating 0 0 - - 0  Moving slowly or fidgety/restless 0 0 - - 0  Suicidal thoughts 0 1 - - 0  PHQ-9 Score 2 6 - - 5  Difficult doing work/chores Somewhat difficult Very difficult - - -    Quality of Life:     Quality of Life - 03/10/16 1533      Quality of Life Scores   Health/Function Pre 19.63 %   Health/Function Post 20.75 %   Health/Function % Change 5.71 %   Socioeconomic Pre 20.33 %   Socioeconomic Post 20.71 %   Socioeconomic % Change  1.87 %   Psych/Spiritual Pre 19.29 %   Psych/Spiritual Post 21 %   Psych/Spiritual % Change 8.86 %   Family Pre 21 %   Family Post 21 %   Family % Change 0 %   GLOBAL Pre 19.88 %   GLOBAL Post 20.83 %   GLOBAL % Change 4.78 %      Personal Goals: Goals established at orientation with  interventions provided to work toward goal.     Personal Goals and Risk Factors at Admission - 12/16/15 1517      Core Components/Risk Factors/Patient Goals on Admission   Sedentary Yes  Residents at [facility], so access to their gym facility   Intervention Provide advice, education, support and counseling about physical activity/exercise needs.;Develop an individualized exercise prescription for aerobic and resistive training based on initial evaluation findings, risk stratification, comorbidities and participant's personal goals.   Expected Outcomes Achievement of increased cardiorespiratory fitness and enhanced flexibility, muscular endurance and strength shown through measurements of functional capacity and personal statement of participant.   Increase Strength and Stamina Yes   Intervention Provide advice, education, support and counseling about physical activity/exercise needs.;Develop an individualized exercise prescription for aerobic and resistive training based on initial evaluation findings, risk stratification, comorbidities and participant's personal goals.   Expected Outcomes Achievement of increased cardiorespiratory fitness and enhanced flexibility, muscular endurance and strength shown through measurements of functional capacity and personal statement of participant.   Improve shortness of breath with ADL's Yes   Intervention Provide education, individualized exercise plan and daily activity instruction to help decrease symptoms of SOB with activities of daily living.   Expected Outcomes Short Term: Achieves a reduction of symptoms when performing activities of daily living.   Develop more efficient breathing techniques such as purse lipped breathing and diaphragmatic breathing; and practicing self-pacing with activity Yes   Intervention Provide education, demonstration and support about specific breathing techniuqes utilized for more efficient breathing. Include techniques such  as pursed lipped breathing, diaphragmatic breathing and self-pacing activity.   Expected Outcomes Short Term: Participant will be able to demonstrate and use breathing techniques as needed throughout daily activities.   Increase knowledge of respiratory medications and ability to use respiratory devices properly  Yes  Uses SVN with Albuterol, Brovana, and Pulmicort; uses Spiriva; Albuterol MDI and spacer given. Oxygen 4-5l/m; portable concentrator   Intervention Provide education and demonstration as needed of appropriate use of medications, inhalers, and oxygen therapy.   Expected Outcomes Short Term: Achieves understanding of medications use. Understands that oxygen is a medication prescribed by physician. Demonstrates appropriate use of inhaler and oxygen therapy.   Personal Goal Other Yes   Personal Goal Able to go shopping again   Intervention Provide exercise prescription and education to increase functional capacity and improved shortness of breath   Expected  Outcomes Able to gain strength and stamina to shopping.       Personal Goals Discharge:     Goals and Risk Factor Review    Row Name 12/24/15 1401 01/09/16 1057 01/09/16 1406 01/09/16 1408 01/09/16 1410     Core Components/Risk Factors/Patient Goals Review   Personal Goals Review Develop more efficient breathing techniques such as purse lipped breathing and diaphragmatic breathing and practicing self-pacing with activity. Weight Management/Obesity Improve shortness of breath with ADL's Increase knowledge of respiratory medications and ability to use respiratory devices properly. Develop more efficient breathing techniques such as purse lipped breathing and diaphragmatic breathing and practicing self-pacing with activity.   Review Reivewed PLB with pt today.  Reviewed technique and how it can help with reducing shortness of breath. Khelani said it is hard for her to lose weight. Wafa said "it will probably help me to lose weight with  the exercise in [facility]. Discussed writing down her food intake as one option when she asked me how I keep my weight down. Ms. Haffey is seeing progress in her ADL's.  She says that it is easier for her to walk farther and with less SOB since she has started Pulmonary Rehab.  Ms. Sorenson takes Proair, Brovana, Pulmicort and Spiriva for her respiratory disease.  She has a spacer to use with the Proair.  She understands how to take all of her medications and does not have any questions at this time.  Ms. Younts demonstrated the proper technique for PLB while exercising.   Expected Outcomes Able to use PLB independently without queuing WEight loss of 5lbs. Continued exercise should help her progress with ADL's and decrease her SOB even further.  Continuing to take her medications as prescribed and when needed with help Ms. Lalla decrease her exacerbations and SOB. Understanding the proper technique will help her recover from SOB, exaberations and anxiety if these issues occur.    [place] Name 01/12/16 1106 02/09/16 1411 02/13/16 1517 02/13/16 1519 02/13/16 1522     Core Components/Risk Factors/Patient Goals Review   Personal Goals Review Increase Strength and Stamina;Sedentary Sedentary;Weight Management/Obesity;Increase Strength and Stamina Improve shortness of breath with ADL's Increase knowledge of respiratory medications and ability to use respiratory devices properly. Develop more efficient breathing techniques such as purse lipped breathing and diaphragmatic breathing and practicing self-pacing with activity.   Review Home exercise was reviewed with the patient. She has not yet started home exercise but has checked into a fitness complex at her residence. She reported improvements in strength and stamina, as well as having a better attitude.  Kambryn is feeling much better overall; not as depressed.  She states she stil doesnt have a lot of energy - has trouble if she sits too long after meals -  sometimes short of breath.  She wants to work on her waist. Nery is finding it easier to walk longer distances without so much SOB. Keyly takes Albuterol MDI, Brovana, Pulmicort, Spiriva and ProAir at home for her respiratory issues.  She has a spacer for her MDIs and knowes to rinse her mouth after taking the seroid inhalers.  She also wears 4L O2 at all times.  Jon demonstrates proper technique of PLB.   Expected Outcomes Patient will start home exercise at the complex at her residence, increasing her frequency of exercise, which will aid in her continued progress of increasing her strength and stamina.  Marya will continue to improve her fitness and see imoprovement in energy levels. Continued strength  training and cardiovascular exercise will help Keilah's SOB improve with ADLs.  Taking her respiratory medications as prescribed by her doctor will help decrease Patrisha's SOB and chances of exacerbation.  PLB will help Chelli regain her breath during exercise.    [place] Name 02/25/16 1504 03/03/16 1457           Core Components/Risk Factors/Patient Goals Review   Personal Goals Review Increase knowledge of respiratory medications and ability to use respiratory devices properly.;Improve shortness of breath with ADL's;Develop more efficient breathing techniques such as purse lipped breathing and diaphragmatic breathing and practicing self-pacing with activity. Weight Management/Obesity;Sedentary;Develop more efficient breathing techniques such as purse lipped breathing and diaphragmatic breathing and practicing self-pacing with activity.;Improve shortness of breath with ADL's;Increase knowledge of respiratory medications and ability to use respiratory devices properly.;Increase Strength and Stamina      Review Ms Shimon has had increased shortness of breath and is pacing and using PLB. Today she took her rescue inhale during LW. She used her spacer, but did  realize she should wait in between puffs for about   a minute. She was educated on the benefits of waiting and will take her MDI as directed. Ms Benway will graduate next week. She continues to manage her weight with healthy eatting and manage her COPD with PLB and pacing. Ms Daughtrey has good understanding of her oxygen and MDI's and the importance of exercise. She will exercise at the gym facility at [facility] . [employer] has helped her to do more cooking and generally feel better about herself.      Expected Outcomes  - Continue managing her COPD          Nutrition & Weight - Outcomes:      Post Biometrics - 03/01/16 1248       Post  Biometrics   Height 5' 3.25" (1.607 m)   Weight 155 lb 8 oz (70.5 kg)   Waist Circumference 37 inches   Hip Circumference 42.5 inches   Waist to Hip Ratio 0.87 %   BMI (Calculated) 27.4      Nutrition:     Nutrition Therapy & Goals - 12/16/15 1515      Nutrition Therapy   Diet Ms Stefanelli prefers not to meet with the dietitian. They are residents at a retirement community and eat their lunch at their dining facility.      Nutrition Discharge:   Education Questionnaire Score:     Knowledge Questionnaire Score - 12/16/15 1508      Knowledge Questionnaire Score   Pre Score 8/10      Goals reviewed with patient; copy given to patient.

## 2016-03-16 NOTE — Progress Notes (Unsigned)
Discharge Summary  Patient Details  Name: [name] [name] MRN: [id number] Date of Birth: May 09, 1941 Referring Provider:   Flowsheet Row Pulmonary Rehab from 12/16/2015 in [facility]  Referring Provider  Simonne Maffucci MD       Number of Visits: 36  Reason for Discharge:  {CHL AMB CP REHAB REASON FOR [phone]}  Smoking History:  History  Smoking Status  . Former Smoker  . Packs/day: 3.00  . Years: 34.00  . Types: Cigarettes  . Quit date: 05/29/1995  Smokeless Tobacco  . Never Used    Diagnosis:  No diagnosis found.  ADL [facility]:     Pulmonary Assessment Scores    Row Name 12/16/15 1511 01/30/16 1352 03/10/16 1536     ADL [facility]SD   ADL Phase Entry Mid Exit   SOB Score total 74 65 64   Rest 0 0 0   Walk 0 0 1   Stairs 0 5 5   Bath 2 0 1   Dress '4 3 3   '$ Shop '5 3 4      '$ Initial Exercise Prescription:     Initial Exercise Prescription - 12/16/15 1500      Date of Initial Exercise RX and Referring Provider   Date 12/16/15   Referring Provider Simonne Maffucci MD     Oxygen   Oxygen Continuous   Liters 4     Treadmill   MPH 1.4   Grade 0   Minutes 15   METs 2.07     NuStep   Level 1   Minutes 15   METs 2     Recumbant Elliptical   Level 1   RPM 50   Minutes 15   METs 2     Prescription Details   Frequency (times per week) 3   Duration Progress to 45 minutes of aerobic exercise without signs/symptoms of physical distress     Intensity   THRR 40-80% of Max Heartrate 99-131   Ratings of Perceived Exertion 11-13   Perceived Dyspnea 0-4     Progression   Progression Continue to progress workloads to maintain intensity without signs/symptoms of physical distress.     Resistance Training   Training Prescription Yes   Weight 2 lbs   Reps 10-12      Discharge Exercise Prescription (Final Exercise Prescription Changes):     Exercise Prescription Changes - 03/04/16 1100      Exercise Review   Progression  Yes     Response to Exercise   Blood Pressure (Admit) 158/72   Blood Pressure (Exercise) 160/70   Blood Pressure (Exit) 122/74   Heart Rate (Admit) 59 bpm   Heart Rate (Exercise) 98 bpm   Heart Rate (Exit) 60 bpm   Oxygen Saturation (Admit) 98 %   Oxygen Saturation (Exercise) 93 %   Oxygen Saturation (Exit) 98 %   Rating of Perceived Exertion (Exercise) 13   Perceived Dyspnea (Exercise) 3   Symptoms none   Duration Progress to 45 minutes of aerobic exercise without signs/symptoms of physical distress   Intensity THRR unchanged     Progression   Progression Continue to progress workloads to maintain intensity without signs/symptoms of physical distress.     Resistance Training   Training Prescription Yes   Weight 3   Reps 10-15     Interval Training   Interval Training No     Oxygen   Oxygen Continuous   Liters 4     Treadmill  MPH 2   Grade 1.5   Minutes 15   METs 2.94     NuStep   Level 5   Minutes 15   METs 2     REL-XR   Level 6   Minutes 15   METs 2.1      Functional Capacity:     6 Minute Walk    Row Name 12/16/15 1544 02/02/16 1058 03/01/16 1237     6 Minute Walk   Phase Initial Mid Program Discharge   Distance 1000 feet 1100 feet 1100 feet   Distance % Change  - 10 % 10 %   Walk Time 6 minutes 6 minutes 6 minutes   # of Rest Breaks 0 0 0   MPH 1.89 2.08 2.08   METS 2.1 2.6 2.6   RPE '17 13 13   '$ Perceived Dyspnea  '2 4 4   '$ VO2 Peak 7.35 9.09 9.09   Symptoms Yes (comment) No No   Comments fatigued  -  -   Resting HR 69 bpm 74 bpm 68 bpm   Resting BP 116/54 104/80 138/80   Max Ex. HR 91 bpm 89 bpm 88 bpm   Max Ex. BP 144/56 142/70 148/82   2 Minute Post BP 144/60 132/70 122/80     Interval HR   Baseline HR 69 74 68   1 Minute HR 80 79 85   2 Minute HR 85 85 81   3 Minute HR 84 86 85   4 Minute HR 89 84 85   5 Minute HR 89 89 87   6 Minute HR 91 85 88   2 Minute Post HR 82 79 79   Interval Heart Rate? Yes Yes Yes     Interval  Oxygen   Interval Oxygen? Yes Yes Yes   Baseline Oxygen Saturation % 97 % 95 % 99 %   Baseline Liters of Oxygen 4 L 4 L 4 L   1 Minute Oxygen Saturation % 95 % 96 % 96 %   1 Minute Liters of Oxygen 4 L 4 L 4 L   2 Minute Oxygen Saturation % 94 % 93 % 94 %   2 Minute Liters of Oxygen 4 L 4 L 4 L   3 Minute Oxygen Saturation % 94 % 91 % 93 %   3 Minute Liters of Oxygen 4 L 4 L 4 L   4 Minute Oxygen Saturation % 95 % 90 % 93 %   4 Minute Liters of Oxygen 4 L 4 L 4 L   5 Minute Oxygen Saturation % 94 % 91 % 92 %   5 Minute Liters of Oxygen 4 L 4 L 4 L   6 Minute Oxygen Saturation % 96 % 90 % 91 %   6 Minute Liters of Oxygen 4 L 4 L 88 L   2 Minute Post Oxygen Saturation % 96 % 98 % 96 %   2 Minute Post Liters of Oxygen 4 L 4 L 4 L      Psychological, QOL, Others - Outcomes: PHQ 2/9: Depression screen [facility] 2/9 03/10/2016 12/16/2015 03/04/2015 02/18/2014 11/13/2012  Decreased Interest 0 2 0 0 1  Down, Depressed, Hopeless 0 1 1 0 1  PHQ - 2 Score 0 3 1 0 2  Altered sleeping 1 0 - - 0  Tired, decreased energy 1 2 - - 2  Change in appetite 0 0 - - 0  Feeling bad or failure about yourself  0 0 - - 1  Trouble concentrating 0 0 - - 0  Moving slowly or fidgety/restless 0 0 - - 0  Suicidal thoughts 0 1 - - 0  PHQ-9 Score 2 6 - - 5  Difficult doing work/chores Somewhat difficult Very difficult - - -    Quality of Life:     Quality of Life - 03/10/16 1533      Quality of Life Scores   Health/Function Pre 19.63 %   Health/Function Post 20.75 %   Health/Function % Change 5.71 %   Socioeconomic Pre 20.33 %   Socioeconomic Post 20.71 %   Socioeconomic % Change  1.87 %   Psych/Spiritual Pre 19.29 %   Psych/Spiritual Post 21 %   Psych/Spiritual % Change 8.86 %   Family Pre 21 %   Family Post 21 %   Family % Change 0 %   GLOBAL Pre 19.88 %   GLOBAL Post 20.83 %   GLOBAL % Change 4.78 %      Personal Goals: Goals established at orientation with interventions provided to work toward  goal.     Personal Goals and Risk Factors at Admission - 12/16/15 1517      Core Components/Risk Factors/Patient Goals on Admission   Sedentary Yes  Residents at [facility], so access to their gym facility   Intervention Provide advice, education, support and counseling about physical activity/exercise needs.;Develop an individualized exercise prescription for aerobic and resistive training based on initial evaluation findings, risk stratification, comorbidities and participant's personal goals.   Expected Outcomes Achievement of increased cardiorespiratory fitness and enhanced flexibility, muscular endurance and strength shown through measurements of functional capacity and personal statement of participant.   Increase Strength and Stamina Yes   Intervention Provide advice, education, support and counseling about physical activity/exercise needs.;Develop an individualized exercise prescription for aerobic and resistive training based on initial evaluation findings, risk stratification, comorbidities and participant's personal goals.   Expected Outcomes Achievement of increased cardiorespiratory fitness and enhanced flexibility, muscular endurance and strength shown through measurements of functional capacity and personal statement of participant.   Improve shortness of breath with ADL's Yes   Intervention Provide education, individualized exercise plan and daily activity instruction to help decrease symptoms of SOB with activities of daily living.   Expected Outcomes Short Term: Achieves a reduction of symptoms when performing activities of daily living.   Develop more efficient breathing techniques such as purse lipped breathing and diaphragmatic breathing; and practicing self-pacing with activity Yes   Intervention Provide education, demonstration and support about specific breathing techniuqes utilized for more efficient breathing. Include techniques such as pursed lipped breathing,  diaphragmatic breathing and self-pacing activity.   Expected Outcomes Short Term: Participant will be able to demonstrate and use breathing techniques as needed throughout daily activities.   Increase knowledge of respiratory medications and ability to use respiratory devices properly  Yes  Uses SVN with Albuterol, Brovana, and Pulmicort; uses Spiriva; Albuterol MDI and spacer given. Oxygen 4-5l/m; portable concentrator   Intervention Provide education and demonstration as needed of appropriate use of medications, inhalers, and oxygen therapy.   Expected Outcomes Short Term: Achieves understanding of medications use. Understands that oxygen is a medication prescribed by physician. Demonstrates appropriate use of inhaler and oxygen therapy.   Personal Goal Other Yes   Personal Goal Able to go shopping again   Intervention Provide exercise prescription and education to increase functional capacity and improved shortness of breath   Expected Outcomes Able to gain  strength and stamina to shopping.       Personal Goals Discharge:     Goals and Risk Factor Review    Row Name 12/24/15 1401 01/09/16 1057 01/09/16 1406 01/09/16 1408 01/09/16 1410     Core Components/Risk Factors/Patient Goals Review   Personal Goals Review Develop more efficient breathing techniques such as purse lipped breathing and diaphragmatic breathing and practicing self-pacing with activity. Weight Management/Obesity Improve shortness of breath with ADL's Increase knowledge of respiratory medications and ability to use respiratory devices properly. Develop more efficient breathing techniques such as purse lipped breathing and diaphragmatic breathing and practicing self-pacing with activity.   Review Reivewed PLB with pt today.  Reviewed technique and how it can help with reducing shortness of breath. [name] [name] said it is hard for her to lose weight. [name] [name] said "it will probably help me to lose weight with the exercise in [facility].  Discussed writing down her food intake as one option when she asked me how I keep my weight down. [name] [name] is seeing progress in her ADL's.  She says that it is easier for her to walk farther and with less SOB since she has started Pulmonary Rehab.  [name] [name] takes Proair, Brovana, Pulmicort and Spiriva for her respiratory disease.  She has a spacer to use with the Proair.  She understands how to take all of her medications and does not have any questions at this time.  [name] [name] demonstrated the proper technique for PLB while exercising.   Expected Outcomes Able to use PLB independently without queuing WEight loss of 5lbs. Continued exercise should help her progress with ADL's and decrease her SOB even further.  Continuing to take her medications as prescribed and when needed with help [name] [name] decrease her exacerbations and SOB. Understanding the proper technique will help her recover from SOB, exaberations and anxiety if these issues occur.    [name] [name] Name 01/12/16 1106 02/09/16 1411 02/13/16 1517 02/13/16 1519 02/13/16 1522     Core Components/Risk Factors/Patient Goals Review   Personal Goals Review Increase Strength and Stamina;Sedentary Sedentary;Weight Management/Obesity;Increase Strength and Stamina Improve shortness of breath with ADL's Increase knowledge of respiratory medications and ability to use respiratory devices properly. Develop more efficient breathing techniques such as purse lipped breathing and diaphragmatic breathing and practicing self-pacing with activity.   Review Home exercise was reviewed with the patient. She has not yet started home exercise but has checked into a fitness complex at her residence. She reported improvements in strength and stamina, as well as having a better attitude.  [name] [name] is feeling much better overall; not as depressed.  She states she stil doesnt have a lot of energy - has trouble if she sits too long after meals - sometimes short of breath.   She wants to work on her waist. [name] [name] is finding it easier to walk longer distances without so much SOB. [name] [name] takes Albuterol MDI, Brovana, Pulmicort, Spiriva and ProAir at home for her respiratory issues.  She has a spacer for her MDIs and knowes to rinse her mouth after taking the seroid inhalers.  She also wears 4L O2 at all times.  Thirza demonstrates proper technique of PLB.   Expected Outcomes Patient will start home exercise at the complex at her residence, increasing her frequency of exercise, which will aid in her continued progress of increasing her strength and stamina.  Tashauna will continue to improve her fitness and see imoprovement in energy levels. Continued strength training and cardiovascular exercise  will help Kayln's SOB improve with ADLs.  Taking her respiratory medications as prescribed by her doctor will help decrease Anjenette's SOB and chances of exacerbation.  PLB will help Vennessa regain her breath during exercise.    [place] Name 02/25/16 1504 03/03/16 1457           Core Components/Risk Factors/Patient Goals Review   Personal Goals Review Increase knowledge of respiratory medications and ability to use respiratory devices properly.;Improve shortness of breath with ADL's;Develop more efficient breathing techniques such as purse lipped breathing and diaphragmatic breathing and practicing self-pacing with activity. Weight Management/Obesity;Sedentary;Develop more efficient breathing techniques such as purse lipped breathing and diaphragmatic breathing and practicing self-pacing with activity.;Improve shortness of breath with ADL's;Increase knowledge of respiratory medications and ability to use respiratory devices properly.;Increase Strength and Stamina      Review [name] [name] has had increased shortness of breath and is pacing and using PLB. Today she took her rescue inhale during LW. She used her spacer, but did  realize she should wait in between puffs for about  a minute. She was educated  on the benefits of waiting and will take her MDI as directed. [name] [name] will graduate next week. She continues to manage her weight with healthy eatting and manage her COPD with PLB and pacing. [name] [name] has good understanding of her oxygen and MDI's and the importance of exercise. She will exercise at the gym facility at [facility] . [employer] has helped her to do more cooking and generally feel better about herself.      Expected Outcomes  - Continue managing her COPD          Nutrition & Weight - Outcomes:      Post Biometrics - 03/01/16 1248       Post  Biometrics   Height 5' 3.25" (1.607 m)   Weight 155 lb 8 oz (70.5 kg)   Waist Circumference 37 inches   Hip Circumference 42.5 inches   Waist to Hip Ratio 0.87 %   BMI (Calculated) 27.4      Nutrition:     Nutrition Therapy & Goals - 12/16/15 1515      Nutrition Therapy   Diet [name] Slaugh prefers not to meet with the dietitian. They are residents at a retirement community and eat their lunch at their dining facility.      Nutrition Discharge:   Education Questionnaire Score:     Knowledge Questionnaire Score - 12/16/15 1508      Knowledge Questionnaire Score   Pre Score 8/10      Goals reviewed with patient; copy given to patient.

## 2016-03-17 DIAGNOSIS — J449 Chronic obstructive pulmonary disease, unspecified: Secondary | ICD-10-CM

## 2016-03-17 DIAGNOSIS — J432 Centrilobular emphysema: Secondary | ICD-10-CM | POA: Diagnosis not present

## 2016-03-17 NOTE — Patient Instructions (Signed)
Discharge Instructions  Patient Details  Name: [name] [name] MRN: [id number] Date of Birth: 11-28-1940 Referring Provider:  Juanito Doom, MD   Number of Visits:36 Reason for Discharge:  Patient reached a stable level of exercise. Patient independent in their exercise.  Smoking History:  History  Smoking Status   Former Smoker   Packs/day: 3.00   Years: 34.00   Types: Cigarettes   Quit date: 05/29/1995  Smokeless Tobacco   Never Used    Diagnosis:  COPD, severe ([place])  Initial Exercise Prescription:     Initial Exercise Prescription - 12/16/15 1500      Date of Initial Exercise RX and Referring Provider   Date 12/16/15   Referring Provider Simonne Maffucci MD     Oxygen   Oxygen Continuous   Liters 4     Treadmill   MPH 1.4   Grade 0   Minutes 15   METs 2.07     NuStep   Level 1   Minutes 15   METs 2     Recumbant Elliptical   Level 1   RPM 50   Minutes 15   METs 2     Prescription Details   Frequency (times per week) 3   Duration Progress to 45 minutes of aerobic exercise without signs/symptoms of physical distress     Intensity   THRR 40-80% of Max Heartrate 99-131   Ratings of Perceived Exertion 11-13   Perceived Dyspnea 0-4     Progression   Progression Continue to progress workloads to maintain intensity without signs/symptoms of physical distress.     Resistance Training   Training Prescription Yes   Weight 2 lbs   Reps 10-12      Discharge Exercise Prescription (Final Exercise Prescription Changes):     Exercise Prescription Changes - 03/16/16 1500      Exercise Review   Progression Yes     Response to Exercise   Blood Pressure (Admit) 150/82   Blood Pressure (Exercise) 152/76   Blood Pressure (Exit) 110/66   Heart Rate (Admit) 59 bpm   Heart Rate (Exercise) 89 bpm   Heart Rate (Exit) 77 bpm   Oxygen Saturation (Admit) 97 %   Oxygen Saturation (Exercise) 97 %   Oxygen Saturation (Exit) 96 %   Rating of  Perceived Exertion (Exercise) 17   Perceived Dyspnea (Exercise) 4   Symptoms cold/sinus and neck pain   Duration Progress to 45 minutes of aerobic exercise without signs/symptoms of physical distress   Intensity THRR unchanged     Progression   Progression Continue to progress workloads to maintain intensity without signs/symptoms of physical distress.     Resistance Training   Training Prescription Yes   Weight 3   Reps 10-15     Treadmill   MPH 2   Grade 1.5   Minutes 15   METs 2.94     NuStep   Level 5   Minutes 15   METs 1.8     REL-XR   Level 7   Minutes 15      Functional Capacity:     6 Minute Walk    Row Name 12/16/15 1544 02/02/16 1058 03/01/16 1237     6 Minute Walk   Phase Initial Mid Program Discharge   Distance 1000 feet 1100 feet 1100 feet   Distance % Change  -- 10 % 10 %   Walk Time 6 minutes 6 minutes 6 minutes   # of Rest Breaks 0  0 0   MPH 1.89 2.08 2.08   METS 2.1 2.6 2.6   RPE '17 13 13   '$ Perceived Dyspnea  '2 4 4   '$ VO2 Peak 7.35 9.09 9.09   Symptoms Yes (comment) No No   Comments fatigued  --  --   Resting HR 69 bpm 74 bpm 68 bpm   Resting BP 116/54 104/80 138/80   Max Ex. HR 91 bpm 89 bpm 88 bpm   Max Ex. BP 144/56 142/70 148/82   2 Minute Post BP 144/60 132/70 122/80     Interval HR   Baseline HR 69 74 68   1 Minute HR 80 79 85   2 Minute HR 85 85 81   3 Minute HR 84 86 85   4 Minute HR 89 84 85   5 Minute HR 89 89 87   6 Minute HR 91 85 88   2 Minute Post HR 82 79 79   Interval Heart Rate? Yes Yes Yes     Interval Oxygen   Interval Oxygen? Yes Yes Yes   Baseline Oxygen Saturation % 97 % 95 % 99 %   Baseline Liters of Oxygen 4 L 4 L 4 L   1 Minute Oxygen Saturation % 95 % 96 % 96 %   1 Minute Liters of Oxygen 4 L 4 L 4 L   2 Minute Oxygen Saturation % 94 % 93 % 94 %   2 Minute Liters of Oxygen 4 L 4 L 4 L   3 Minute Oxygen Saturation % 94 % 91 % 93 %   3 Minute Liters of Oxygen 4 L 4 L 4 L   4 Minute Oxygen Saturation  % 95 % 90 % 93 %   4 Minute Liters of Oxygen 4 L 4 L 4 L   5 Minute Oxygen Saturation % 94 % 91 % 92 %   5 Minute Liters of Oxygen 4 L 4 L 4 L   6 Minute Oxygen Saturation % 96 % 90 % 91 %   6 Minute Liters of Oxygen 4 L 4 L 88 L   2 Minute Post Oxygen Saturation % 96 % 98 % 96 %   2 Minute Post Liters of Oxygen 4 L 4 L 4 L      Quality of Life:     Quality of Life - 03/10/16 1533      Quality of Life Scores   Health/Function Pre 19.63 %   Health/Function Post 20.75 %   Health/Function % Change 5.71 %   Socioeconomic Pre 20.33 %   Socioeconomic Post 20.71 %   Socioeconomic % Change  1.87 %   Psych/Spiritual Pre 19.29 %   Psych/Spiritual Post 21 %   Psych/Spiritual % Change 8.86 %   Family Pre 21 %   Family Post 21 %   Family % Change 0 %   GLOBAL Pre 19.88 %   GLOBAL Post 20.83 %   GLOBAL % Change 4.78 %      Personal Goals: Goals established at orientation with interventions provided to work toward goal.     Personal Goals and Risk Factors at Admission - 12/16/15 1517      Core Components/Risk Factors/Patient Goals on Admission   Sedentary Yes  Residents at [facility], so access to their gym facility   Intervention Provide advice, education, support and counseling about physical activity/exercise needs.;Develop an individualized exercise prescription for aerobic and resistive training based on  initial evaluation findings, risk stratification, comorbidities and participant's personal goals.   Expected Outcomes Achievement of increased cardiorespiratory fitness and enhanced flexibility, muscular endurance and strength shown through measurements of functional capacity and personal statement of participant.   Increase Strength and Stamina Yes   Intervention Provide advice, education, support and counseling about physical activity/exercise needs.;Develop an individualized exercise prescription for aerobic and resistive training based on initial evaluation findings, risk  stratification, comorbidities and participant's personal goals.   Expected Outcomes Achievement of increased cardiorespiratory fitness and enhanced flexibility, muscular endurance and strength shown through measurements of functional capacity and personal statement of participant.   Improve shortness of breath with ADL's Yes   Intervention Provide education, individualized exercise plan and daily activity instruction to help decrease symptoms of SOB with activities of daily living.   Expected Outcomes Short Term: Achieves a reduction of symptoms when performing activities of daily living.   Develop more efficient breathing techniques such as purse lipped breathing and diaphragmatic breathing; and practicing self-pacing with activity Yes   Intervention Provide education, demonstration and support about specific breathing techniuqes utilized for more efficient breathing. Include techniques such as pursed lipped breathing, diaphragmatic breathing and self-pacing activity.   Expected Outcomes Short Term: Participant will be able to demonstrate and use breathing techniques as needed throughout daily activities.   Increase knowledge of respiratory medications and ability to use respiratory devices properly  Yes  Uses SVN with Albuterol, Brovana, and Pulmicort; uses Spiriva; Albuterol MDI and spacer given. Oxygen 4-5l/m; portable concentrator   Intervention Provide education and demonstration as needed of appropriate use of medications, inhalers, and oxygen therapy.   Expected Outcomes Short Term: Achieves understanding of medications use. Understands that oxygen is a medication prescribed by physician. Demonstrates appropriate use of inhaler and oxygen therapy.   Personal Goal Other Yes   Personal Goal Able to go shopping again   Intervention Provide exercise prescription and education to increase functional capacity and improved shortness of breath   Expected Outcomes Able to gain strength and stamina to  shopping.       Personal Goals Discharge:     Goals and Risk Factor Review - 03/03/16 1457      Core Components/Risk Factors/Patient Goals Review   Personal Goals Review Weight Management/Obesity;Sedentary;Develop more efficient breathing techniques such as purse lipped breathing and diaphragmatic breathing and practicing self-pacing with activity.;Improve shortness of breath with ADL's;Increase knowledge of respiratory medications and ability to use respiratory devices properly.;Increase Strength and Stamina   Review Ms Guerrero will graduate next week. She continues to manage her weight with healthy eatting and manage her COPD with PLB and pacing. Ms Hedges has good understanding of her oxygen and MDI's and the importance of exercise. She will exercise at the gym facility at [facility] . [facility] has helped her to do more cooking and generally feel better about herself.   Expected Outcomes Continue managing her COPD       Nutrition & Weight - Outcomes:      Post Biometrics - 03/01/16 1248       Post  Biometrics   Height 5' 3.25" (1.607 m)   Weight 155 lb 8 oz (70.5 kg)   Waist Circumference 37 inches   Hip Circumference 42.5 inches   Waist to Hip Ratio 0.87 %   BMI (Calculated) 27.4      Nutrition:     Nutrition Therapy & Goals - 12/16/15 1515      Nutrition Therapy   Diet Ms Laatsch  prefers not to meet with the dietitian. They are residents at a retirement community and eat their lunch at their dining facility.      Nutrition Discharge:   Education Questionnaire Score:     Knowledge Questionnaire Score - 12/16/15 1508      Knowledge Questionnaire Score   Pre Score 8/10      Goals reviewed with patient; copy given to patient.

## 2016-03-17 NOTE — Progress Notes (Signed)
Pulmonary Individual Treatment Plan  Patient Details  Name: [name] [name] MRN: [id number] Date of Birth: 1941-01-30 Referring Provider:   Flowsheet Row Pulmonary Rehab from 12/16/2015 in [facility] Cardiac and Pulmonary Rehab  Referring Provider  Simonne Maffucci MD      Initial Encounter Date:  Flowsheet Row Pulmonary Rehab from 12/16/2015 in [facility] Cardiac and Pulmonary Rehab  Date  12/16/15  Referring Provider  Simonne Maffucci MD      Visit Diagnosis: COPD, severe ([place])  Patient's Home Medications on Admission:  Current Outpatient Prescriptions:    albuterol (PROAIR HFA) 108 (90 BASE) MCG/ACT inhaler, Inhale 2 puffs into the lungs every 4 (four) hours as needed., Disp: 1 Inhaler, Rfl: 5   albuterol (PROVENTIL) (2.5 MG/3ML) 0.083% nebulizer solution, Take every 4-6 hours as needed for shortness of breath, Disp: 75 mL, Rfl: 5   arformoterol (BROVANA) 15 MCG/2ML NEBU, Take 2 mLs (15 mcg total) by nebulization 2 (two) times daily., Disp: 120 mL, Rfl: 11   aspirin EC 81 MG tablet, Take 1 tablet (81 mg total) by mouth every Monday, Wednesday, and Friday., Disp: , Rfl:    benzonatate (TESSALON) 100 MG capsule, Take by mouth 3 (three) times daily as needed for cough., Disp: , Rfl:    budesonide (PULMICORT) 0.5 MG/2ML nebulizer solution, Take 2 mLs (0.5 mg total) by nebulization 2 (two) times daily., Disp: 60 mL, Rfl: 12   buPROPion (WELLBUTRIN SR) 150 MG 12 hr tablet, TAKE ONE TABLET BY MOUTH TWICE DAILY, Disp: 60 tablet, Rfl: 6   conjugated estrogens (PREMARIN) vaginal cream, Place 1 Applicatorful vaginally daily. Apply 0.69m (pea-sized amount)  just inside the vaginal introitus with a finger-tip every night for two weeks and then Monday, Wednesday and Friday nights., Disp: 30 g, Rfl: 12   CRANBERRY PO, Take 2 tablets by mouth every morning. , Disp: , Rfl:    estradiol (ESTRACE VAGINAL) 0.1 MG/GM vaginal cream, Apply 0.581m(pea-sized amount)  just inside the vaginal introitus with a  finger-tip every night for two weeks and then Monday, Wednesday and Friday nights., Disp: 30 g, Rfl: 12   guaifenesin (HUMIBID E) 400 MG TABS tablet, Take 400 mg by mouth every 4 (four) hours., Disp: , Rfl:    losartan (COZAAR) 50 MG tablet, TAKE ONE TABLET BY MOUTH EVERY DAY, Disp: 30 tablet, Rfl: 11   metoprolol tartrate (LOPRESSOR) 25 MG tablet, TAKE ONE TABLET BY MOUTH TWICE DAILY, Disp: 60 tablet, Rfl: 11   MYRBETRIQ 25 MG TB24 tablet, TAKE ONE TABLET BY MOUTH EVERY DAY, Disp: 30 tablet, Rfl: 11   omeprazole (PRILOSEC) 40 MG capsule, TAKE ONE CAPSULE BY MOUTH EVERY DAY, Disp: 30 capsule, Rfl: 11   pravastatin (PRAVACHOL) 80 MG tablet, ONE TABLET BY MOUTH AT BEDTIME, Disp: 90 tablet, Rfl: 1   psyllium (METAMUCIL) 58.6 % powder, Take 1 packet by mouth at bedtime. , Disp: , Rfl:    sertraline (ZOLOFT) 100 MG tablet, Take one tablet in the morning and one-half at bedtime, Disp: 45 tablet, Rfl: 11   SPIRIVA HANDIHALER 18 MCG inhalation capsule, INHALE CONTENTS OF 1 CAPSULE ONCE DAILY., Disp: 30 capsule, Rfl: 5   vitamin B-12 (CYANOCOBALAMIN) 1000 MCG tablet, Take 1,000 mcg by mouth daily., Disp: , Rfl:   Past Medical History: Past Medical History:  Diagnosis Date   Chronic cystitis    recurrent UTI (4+ in last year), on suppressive doxy (uro [place])   Colon polyp    COPD, severe 01/27/07  Depression with anxiety    Diverticular disease 10/09/09   CT abd no sigmoid mass DDD L5/S1   Dyslipidemia    GERD (gastroesophageal reflux disease)    Hematuria    h/o kidney stones   Hiatal hernia    Hypertension    Left scapholunate ligament tear 2014   found on imaging   Lung cancer ([place]) 1987   Partial RLL Lobectomy.   Osteoarthritis    Osteopenia 08/2011   DEXA: L femur -1.6   Peptic ulcer    Spinal abscess ([place]) 2000   secondary to periodontal disease    Tobacco Use: History  Smoking Status   Former Smoker   Packs/day: 3.00   Years: 34.00   Types:  Cigarettes   Quit date: 05/29/1995  Smokeless Tobacco   Never Used    Labs: Recent Review Flowsheet Data    Labs for ITP Cardiac and Pulmonary Rehab Latest Ref Rng & Units 09/06/2011 11/13/2012 02/13/2014 02/25/2015 09/02/2015   Cholestrol 0 - 200 mg/dL 228(H) 188 219(H) 204(H) 147   LDLCALC 0 - 99 mg/dL - 88 108(H) 100(H) 57   LDLDIRECT mg/dL 135.9 - - - -   HDL >39.00 mg/dL 65.50 64.60 71.30 76.90 67.90   Trlycerides 0.0 - 149.0 mg/dL 166.0(H) 175.0(H) 200.0(H) 132.0 113.0   Hemoglobin A1c 4.6 - 6.5 % - - 5.5 - -       ADL UCSD:     Pulmonary Assessment Scores    Row Name 12/16/15 1511 01/30/16 1352 03/10/16 1536     ADL UCSD   ADL Phase Entry Mid Exit   SOB Score total 74 65 64   Rest 0 0 0   Walk 0 0 1   Stairs 0 5 5   Bath 2 0 1   Dress 4 3 3    Shop 5 3 4       Pulmonary Function Assessment:     Pulmonary Function Assessment - 12/16/15 1511      Initial Spirometry Results   Comments Test Date: 12/16/2015      Exercise Target Goals:    Exercise Program Goal: Individual exercise prescription set with THRR, safety & activity barriers. Participant demonstrates ability to understand and report RPE using BORG scale, to self-measure pulse accurately, and to acknowledge the importance of the exercise prescription.  Exercise Prescription Goal: Starting with aerobic activity 30 plus minutes a day, 3 days per week for initial exercise prescription. Provide home exercise prescription and guidelines that participant acknowledges understanding prior to discharge.  Activity Barriers & Risk Stratification:     Activity Barriers & Cardiac Risk Stratification - 12/16/15 1507      Activity Barriers & Cardiac Risk Stratification   Activity Barriers Deconditioning;Shortness of Breath   Cardiac Risk Stratification Moderate      6 Minute Walk:     6 Minute Walk    Row Name 12/16/15 1544 02/02/16 1058 03/01/16 1237     6 Minute Walk   Phase Initial Mid Program  Discharge   Distance 1000 feet 1100 feet 1100 feet   Distance % Change  -- 10 % 10 %   Walk Time 6 minutes 6 minutes 6 minutes   # of Rest Breaks 0 0 0   MPH 1.89 2.08 2.08   METS 2.1 2.6 2.6   RPE 17 13 13    Perceived Dyspnea  2 4 4    VO2 Peak 7.35 9.09 9.09   Symptoms Yes (comment) No No   Comments fatigued  --  --  Resting HR 69 bpm 74 bpm 68 bpm   Resting BP 116/54 104/80 138/80   Max Ex. HR 91 bpm 89 bpm 88 bpm   Max Ex. BP 144/56 142/70 148/82   2 Minute Post BP 144/60 132/70 122/80     Interval HR   Baseline HR 69 74 68   1 Minute HR 80 79 85   2 Minute HR 85 85 81   3 Minute HR 84 86 85   4 Minute HR 89 84 85   5 Minute HR 89 89 87   6 Minute HR 91 85 88   2 Minute Post HR 82 79 79   Interval Heart Rate? Yes Yes Yes     Interval Oxygen   Interval Oxygen? Yes Yes Yes   Baseline Oxygen Saturation % 97 % 95 % 99 %   Baseline Liters of Oxygen 4 L 4 L 4 L   1 Minute Oxygen Saturation % 95 % 96 % 96 %   1 Minute Liters of Oxygen 4 L 4 L 4 L   2 Minute Oxygen Saturation % 94 % 93 % 94 %   2 Minute Liters of Oxygen 4 L 4 L 4 L   3 Minute Oxygen Saturation % 94 % 91 % 93 %   3 Minute Liters of Oxygen 4 L 4 L 4 L   4 Minute Oxygen Saturation % 95 % 90 % 93 %   4 Minute Liters of Oxygen 4 L 4 L 4 L   5 Minute Oxygen Saturation % 94 % 91 % 92 %   5 Minute Liters of Oxygen 4 L 4 L 4 L   6 Minute Oxygen Saturation % 96 % 90 % 91 %   6 Minute Liters of Oxygen 4 L 4 L 88 L   2 Minute Post Oxygen Saturation % 96 % 98 % 96 %   2 Minute Post Liters of Oxygen 4 L 4 L 4 L      Initial Exercise Prescription:     Initial Exercise Prescription - 12/16/15 1500      Date of Initial Exercise RX and Referring Provider   Date 12/16/15   Referring Provider Simonne Maffucci MD     Oxygen   Oxygen Continuous   Liters 4     Treadmill   MPH 1.4   Grade 0   Minutes 15   METs 2.07     NuStep   Level 1   Minutes 15   METs 2     Recumbant Elliptical   Level 1   RPM 50    Minutes 15   METs 2     Prescription Details   Frequency (times per week) 3   Duration Progress to 45 minutes of aerobic exercise without signs/symptoms of physical distress     Intensity   THRR 40-80% of Max Heartrate 99-131   Ratings of Perceived Exertion 11-13   Perceived Dyspnea 0-4     Progression   Progression Continue to progress workloads to maintain intensity without signs/symptoms of physical distress.     Resistance Training   Training Prescription Yes   Weight 2 lbs   Reps 10-12      Perform Capillary Blood Glucose checks as needed.  Exercise Prescription Changes:     Exercise Prescription Changes    Row Name 12/24/15 1400 01/06/16 1400 01/07/16 1400 01/21/16 1400 02/03/16 1400     Exercise Review   Progression  --  Yes Yes Yes Yes     Response to Exercise   Blood Pressure (Admit) 138/68 142/68  -- 134/62 140/80   Blood Pressure (Exercise) 142/68 148/70  -- 124/58 142/70   Blood Pressure (Exit) 122/68 110/62  -- 106/60 124/64   Heart Rate (Admit) 67 bpm 64 bpm  -- 65 bpm 70 bpm   Heart Rate (Exercise) 82 bpm 83 bpm  -- 77 bpm 89 bpm   Heart Rate (Exit) 67 bpm 70 bpm  -- 58 bpm 71 bpm   Oxygen Saturation (Admit) 97 % 97 %  -- 90 % 95 %   Oxygen Saturation (Exercise) 94 % 96 %  -- 98 % 90 %   Oxygen Saturation (Exit) 99 % 97 %  -- 99 % 98 %   Rating of Perceived Exertion (Exercise) 13 13  -- 13 13   Perceived Dyspnea (Exercise) 2 4  -- 4 4   Symptoms none none none none none   Comments  --  -- Home Exercise Guidelines given 01/07/16 Home Exercise Guidelines given 01/07/16 Home Exercise Guidelines given 01/07/16   Duration Progress to 45 minutes of aerobic exercise without signs/symptoms of physical distress Progress to 45 minutes of aerobic exercise without signs/symptoms of physical distress Progress to 45 minutes of aerobic exercise without signs/symptoms of physical distress Progress to 45 minutes of aerobic exercise without signs/symptoms of physical  distress Progress to 45 minutes of aerobic exercise without signs/symptoms of physical distress   Intensity THRR unchanged THRR unchanged THRR unchanged THRR unchanged THRR unchanged     Progression   Progression Continue to progress workloads to maintain intensity without signs/symptoms of physical distress. Continue to progress workloads to maintain intensity without signs/symptoms of physical distress. Continue to progress workloads to maintain intensity without signs/symptoms of physical distress. Continue to progress workloads to maintain intensity without signs/symptoms of physical distress. Continue to progress workloads to maintain intensity without signs/symptoms of physical distress.   Average METs 2.1 2.64 2.64 2.2 2.6     Resistance Training   Training Prescription Yes Yes Yes Yes Yes   Weight 2 lbs 2 lbs 2 lbs 3 lbs 3 lbs   Reps 10-12 10-15 10-15 10-15 10-15     Interval Training   Interval Training No No No No No     Oxygen   Oxygen Continuous Continuous Continuous Continuous Continuous   Liters 4 4 4 4 4      Treadmill   MPH 1.6 1.7 1.7 1.8 1.8   Grade 0 0.5 0.5 0.5 0.5   Minutes 15 15 15 15 15    METs 2.23 2.3 2.3 2.5 2.5     NuStep   Level 4 2 2 5 5    Minutes 15 15 15 15 15    METs 1.8 2 2  1.9 2.1     REL-XR   Level 1 2 2 6 6    Minutes 15 15 15 15 15    METs 1.8 3.4 3.4 2.2 3.2     Home Exercise Plan   Plans to continue exercise at  --  -- Home  walking and gym in complex Home  walking and gym in complex Home  walking and gym in complex   Frequency  --  -- Add 1 additional day to program exercise sessions. Add 1 additional day to program exercise sessions. Add 2 additional days to program exercise sessions.   [place] Name 02/18/16 1200 03/01/16 1200 03/04/16 1100 03/16/16 1500       Exercise Review  Progression Yes Yes Yes Yes      Response to Exercise   Blood Pressure (Admit) 132/64 132/64 158/72 150/82    Blood Pressure (Exercise) 158/68 158/68 160/70  152/76    Blood Pressure (Exit) 128/68 128/68 122/74 110/66    Heart Rate (Admit) 72 bpm 72 bpm 59 bpm 59 bpm    Heart Rate (Exercise) 93 bpm 93 bpm 98 bpm 89 bpm    Heart Rate (Exit) 73 bpm 73 bpm 60 bpm 77 bpm    Oxygen Saturation (Admit) 97 % 97 % 98 % 97 %    Oxygen Saturation (Exercise) 95 % 95 % 93 % 97 %    Oxygen Saturation (Exit) 98 % 98 % 98 % 96 %    Rating of Perceived Exertion (Exercise) 13 13 13 17     Perceived Dyspnea (Exercise) 4 4 3 4     Symptoms none none none cold/sinus and neck pain    Duration Progress to 45 minutes of aerobic exercise without signs/symptoms of physical distress Progress to 45 minutes of aerobic exercise without signs/symptoms of physical distress Progress to 45 minutes of aerobic exercise without signs/symptoms of physical distress Progress to 45 minutes of aerobic exercise without signs/symptoms of physical distress    Intensity THRR unchanged THRR unchanged THRR unchanged THRR unchanged      Progression   Progression Continue to progress workloads to maintain intensity without signs/symptoms of physical distress. Continue to progress workloads to maintain intensity without signs/symptoms of physical distress. Continue to progress workloads to maintain intensity without signs/symptoms of physical distress. Continue to progress workloads to maintain intensity without signs/symptoms of physical distress.      Resistance Training   Training Prescription Yes Yes Yes Yes    Weight 3 3 3 3     Reps 10-15 10-15 10-15 10-15      Interval Training   Interval Training No No No  --      Oxygen   Oxygen Continuous Continuous Continuous  --    Liters 4 4 4   --      Treadmill   MPH 1.9 1.9 2 2     Grade 0.5 0.5 1.5 1.5    Minutes 15 15 15 15     METs 2.59 2.59 2.94 2.94      NuStep   Level 5 5 5 5     Minutes 15 15 15 15     METs  --  -- 2 1.8      REL-XR   Level 6 6 6 7     Minutes 15 15 15 15     METs  --  -- 2.1  --      Home Exercise Plan   Plans  to continue exercise at  -- [employer] (comment)  --  --       Exercise Comments:     Exercise Comments    Row Name 12/16/15 1548 12/24/15 1404 01/06/16 1456 01/07/16 1114 01/21/16 1437   Exercise Comments [name] [name] wants to improve her ability to walk to be able to go shopping again. First full day of exercise!  Patient was oriented to gym and equipment including functions, settings, policies, and procedures.  Patient's individual exercise prescription and treatment plan were reviewed.  All starting workloads were established based on the results of the 6 minute walk test done at initial orientation visit.  The plan for exercise progression was also introduced and progression will be customized based on patient's performance and goals. [name] [name] is off to a great  start with her exercise.  She is up to a full 15 min on each piece of equipment.  We will continue to monitor for progression. Reviewed home exercise with pt today.  Pt plans to walk and go to complex gym for exercise.  Reviewed THR, pulse, RPE, sign and symptoms, O2 use, and when to call 911 or MD.  Also discussed weather considerations and indoor options.  Pt voiced understanding. [name] [name] is doing well with exercise. She continues to move up on her workloads.  She is up to level 6 on the XR.  We will continue to monitor her progression.   [place] Name 02/02/16 1106 02/03/16 1437 02/18/16 1255 03/01/16 1246 03/16/16 1545   Exercise Comments 6 min walk assessment was done by patient today. She did show improvement in her distance and all vitals were within acceptable ranges. See 6 min walk data for detailed report.  [name] [name] has been doing well with exercise.  Her walk test results prove that exercise is helping!!  We will continue to monitor her progress. [name] [name] is progressing well with exercise. 6 min walk done today. See 6 min data sheet for detailed report on progress. Upon graduation [name] [name] plans to exercise at the fitness facility in her community.   [name] [name] has progressed well with exrecise and plans to continue at the facility where she lives.      Discharge Exercise Prescription (Final Exercise Prescription Changes):     Exercise Prescription Changes - 03/16/16 1500      Exercise Review   Progression Yes     Response to Exercise   Blood Pressure (Admit) 150/82   Blood Pressure (Exercise) 152/76   Blood Pressure (Exit) 110/66   Heart Rate (Admit) 59 bpm   Heart Rate (Exercise) 89 bpm   Heart Rate (Exit) 77 bpm   Oxygen Saturation (Admit) 97 %   Oxygen Saturation (Exercise) 97 %   Oxygen Saturation (Exit) 96 %   Rating of Perceived Exertion (Exercise) 17   Perceived Dyspnea (Exercise) 4   Symptoms cold/sinus and neck pain   Duration Progress to 45 minutes of aerobic exercise without signs/symptoms of physical distress   Intensity THRR unchanged     Progression   Progression Continue to progress workloads to maintain intensity without signs/symptoms of physical distress.     Resistance Training   Training Prescription Yes   Weight 3   Reps 10-15     Treadmill   MPH 2   Grade 1.5   Minutes 15   METs 2.94     NuStep   Level 5   Minutes 15   METs 1.8     REL-XR   Level 7   Minutes 15       Nutrition:  Target Goals: Understanding of nutrition guidelines, daily intake of sodium <1514m, cholesterol <2035m calories 30% from fat and 7% or less from saturated fats, daily to have 5 or more servings of fruits and vegetables.  Biometrics:      Post Biometrics - 03/01/16 1248       Post  Biometrics   Height 5' 3.25" (1.607 m)   Weight 155 lb 8 oz (70.5 kg)   Waist Circumference 37 inches   Hip Circumference 42.5 inches   Waist to Hip Ratio 0.87 %   BMI (Calculated) 27.4      Nutrition Therapy Plan and Nutrition Goals:     Nutrition Therapy & Goals - 12/16/15 1515      Nutrition Therapy   Diet  [name] [name] prefers not to meet with the dietitian. They are residents at a retirement community and eat  their lunch at their dining facility.      Nutrition Discharge: Rate Your Plate Scores:   Psychosocial: Target Goals: Acknowledge presence or absence of depression, maximize coping skills, provide positive support system. Participant is able to verbalize types and ability to use techniques and skills needed for reducing stress and depression.  Initial Review & Psychosocial Screening:     Initial Psych Review & Screening - 12/16/15 [place]? Yes   Comments [name] [name] has good support from her husband. She states She does not have depression, but medicates with Zoloft and Wellbutrin. Due to her shortness of breath, [name] [name] misses certain activities she use to perform.     Barriers   Psychosocial barriers to participate in program The patient should benefit from training in stress management and relaxation.     Screening Interventions   Interventions Encouraged to exercise;Program counselor consult      Quality of Life Scores:     Quality of Life - 03/10/16 1533      Quality of Life Scores   Health/Function Pre 19.63 %   Health/Function Post 20.75 %   Health/Function % Change 5.71 %   Socioeconomic Pre 20.33 %   Socioeconomic Post 20.71 %   Socioeconomic % Change  1.87 %   Psych/Spiritual Pre 19.29 %   Psych/Spiritual Post 21 %   Psych/Spiritual % Change 8.86 %   Family Pre 21 %   Family Post 21 %   Family % Change 0 %   GLOBAL Pre 19.88 %   GLOBAL Post 20.83 %   GLOBAL % Change 4.78 %      PHQ-9: Recent Review Flowsheet Data    Depression screen [facility] 2/9 03/10/2016 12/16/2015 03/04/2015 02/18/2014 11/13/2012   Decreased Interest 0 2 0  0 1   Down, Depressed, Hopeless 0 1 1 0 1   PHQ - 2 Score 0 3 1 0 2   Altered sleeping 1 0 - - 0   Tired, decreased energy 1 2 - - 2   Change in appetite 0 0 - - 0   Feeling bad or failure about yourself  0 0 - - 1   Trouble concentrating 0 0 - - 0   Moving slowly or fidgety/restless  0 0 - - 0   Suicidal thoughts 0 1 - - 0   PHQ-9 Score 2 6 - - 5   Difficult doing work/chores Somewhat difficult Very difficult - - -      Psychosocial Evaluation and Intervention:     Psychosocial Evaluation - 03/03/16 1019      Discharge Psychosocial Assessment & Intervention   Comments Counselor met with [name] [name] today for final summary prior to discharge.  She reports increased energy as a result of this program and waking up feeling more rested now.  She reports her mood has consistently been positive and her medications are working; in addition to this class.  [name] [name] also reports she has enjoyed working out consistently and the educational components of this class have been helpful.  She plans to work out at the gym in her retirement community along with her spouse upon discharge.  Counselor commended [name] [name] for her hard work and progress made while in Pulmonary Rehab and encouraged her to continue exercising consistently to maintain this progress.  Psychosocial Re-Evaluation:     Psychosocial Re-Evaluation    [place] Name 01/05/16 1128 01/28/16 1218           Psychosocial Re-Evaluation   Comments Follow up with [name] [name] today stating she is already seeing some progress since coming into this program.  She has more stamina and is able to do more.  She continues to sleep well and is typically positive.  Counselor commended [name] [name] for her hard work and commitment to exercise consistently.   Counselor follow up with [name] [name] today reporting improved breathing and less anxiety since coming into this program.  She states she generally feels better and has more energy and her spouse has remarked noticing this in her.  Counselor commended [name] [name] for her progress made and will continue to follow with her throughout the course of this program.          Education: Education Goals: Education classes will be provided on a weekly basis, covering required topics. Participant will state  understanding/return demonstration of topics presented.  Learning Barriers/Preferences:     Learning Barriers/Preferences - 12/16/15 1507      Learning Barriers/Preferences   Learning Barriers None   Learning Preferences Group Instruction;Individual Instruction;Pictoral;Skilled Demonstration;Verbal Instruction;Video;Written Material      Education Topics: Initial Evaluation Education: - Verbal, written and demonstration of respiratory meds, RPE/PD scales, oximetry and breathing techniques. Instruction on use of nebulizers and MDIs: cleaning and proper use, rinsing mouth with steroid doses and importance of monitoring MDI activations. Flowsheet Row Pulmonary Rehab from 03/08/2016 in [facility] Cardiac and Pulmonary Rehab  Date  12/16/15  Educator  LB  Instruction Review Code  2- meets goals/outcomes      General Nutrition Guidelines/Fats and Fiber: -Group instruction provided by verbal, written material, models and posters to present the general guidelines for heart healthy nutrition. Gives an explanation and review of dietary fats and fiber.   Controlling Sodium/Reading Food Labels: -Group verbal and written material supporting the discussion of sodium use in heart healthy nutrition. Review and explanation with models, verbal and written materials for utilization of the food label.   Exercise Physiology & Risk Factors: - Group verbal and written instruction with models to review the exercise physiology of the cardiovascular system and associated critical values. Details cardiovascular disease risk factors and the goals associated with each risk factor. Flowsheet Row Pulmonary Rehab from 03/08/2016 in [facility] Cardiac and Pulmonary Rehab  Date  01/14/16  Educator  AS  Instruction Review Code  2- meets goals/outcomes      Aerobic Exercise & Resistance Training: - Gives group verbal and written discussion on the health impact of inactivity. On the components of aerobic and resistive  training programs and the benefits of this training and how to safely progress through these programs.   Flexibility, Balance, General Exercise Guidelines: - Provides group verbal and written instruction on the benefits of flexibility and balance training programs. Provides general exercise guidelines with specific guidelines to those with heart or lung disease. Demonstration and skill practice provided. Flowsheet Row Pulmonary Rehab from 03/08/2016 in [facility] Cardiac and Pulmonary Rehab  Date  03/03/16  Educator  [facility]  Instruction Review Code  2- meets goals/outcomes      Stress Management: - Provides group verbal and written instruction about the health risks of elevated stress, cause of high stress, and healthy ways to reduce stress.   Depression: - Provides group verbal and written instruction on the correlation between heart/lung disease and depressed mood, treatment  options, and the stigmas associated with seeking treatment. Flowsheet Row Pulmonary Rehab from 03/08/2016 in [facility] Cardiac and Pulmonary Rehab  Date  01/28/16  Educator  [facility]  Instruction Review Code  2- meets goals/outcomes      Exercise & Equipment Safety: - Individual verbal instruction and demonstration of equipment use and safety with use of the equipment.   Infection Prevention: - Provides verbal and written material to individual with discussion of infection control including proper hand washing and proper equipment cleaning during exercise session. Flowsheet Row Pulmonary Rehab from 03/08/2016 in [facility] Cardiac and Pulmonary Rehab  Date  12/16/15  Educator  LB  Instruction Review Code  2- meets goals/outcomes      Falls Prevention: - Provides verbal and written material to individual with discussion of falls prevention and safety. Flowsheet Row Pulmonary Rehab from 03/08/2016 in [facility] Cardiac and Pulmonary Rehab  Date  12/16/15  Educator  LB  Instruction Review Code  2- meets goals/outcomes       Diabetes: - Individual verbal and written instruction to review signs/symptoms of diabetes, desired ranges of glucose level fasting, after meals and with exercise. Advice that pre and post exercise glucose checks will be done for 3 sessions at entry of program.   Chronic Lung Diseases: - Group verbal and written instruction to review new updates, new respiratory medications, new advancements in procedures and treatments. Provide informative websites and "800" numbers of self-education. Flowsheet Row Pulmonary Rehab from 03/08/2016 in [facility] Cardiac and Pulmonary Rehab  Date  03/08/16  Educator  LB  Instruction Review Code  2- meets goals/outcomes      Lung Procedures: - Group verbal and written instruction to describe testing methods done to diagnose lung disease. Review the outcome of test results. Describe the treatment choices: Pulmonary Function Tests, ABGs and oximetry.   Energy Conservation: - Provide group verbal and written instruction for methods to conserve energy, plan and organize activities. Instruct on pacing techniques, use of adaptive equipment and posture/positioning to relieve shortness of breath. Flowsheet Row Pulmonary Rehab from 03/08/2016 in [facility] Cardiac and Pulmonary Rehab  Date  03/03/16  Educator  [facility]  Instruction Review Code  2- meets goals/outcomes      Triggers: - Group verbal and written instruction to review types of environmental controls: home humidity, furnaces, filters, dust mite/pet prevention, HEPA vacuums. To discuss weather changes, air quality and the benefits of nasal washing. Flowsheet Row Pulmonary Rehab from 03/08/2016 in [facility] Cardiac and Pulmonary Rehab  Date  02/02/16  Educator  LB  Instruction Review Code  2- meets goals/outcomes      Exacerbations: - Group verbal and written instruction to provide: warning signs, infection symptoms, calling MD promptly, preventive modes, and value of vaccinations. Review: effective airway  clearance, coughing and/or vibration techniques. Create an Sports administrator. Flowsheet Row Pulmonary Rehab from 03/08/2016 in [facility] Cardiac and Pulmonary Rehab  Date  01/05/16  Educator  LB  Instruction Review Code  2- meets goals/outcomes      Oxygen: - Individual and group verbal and written instruction on oxygen therapy. Includes supplement oxygen, available portable oxygen systems, continuous and intermittent flow rates, oxygen safety, concentrators, and Medicare reimbursement for oxygen. Flowsheet Row Pulmonary Rehab from 03/08/2016 in [facility] Cardiac and Pulmonary Rehab  Date  12/16/15  Educator  LB  Instruction Review Code  2- meets goals/outcomes      Respiratory Medications: - Group verbal and written instruction to review medications for lung disease. Drug class, frequency, complications, importance of  spacers, rinsing mouth after steroid MDI's, and proper cleaning methods for nebulizers. Flowsheet Row Pulmonary Rehab from 03/08/2016 in [facility] Cardiac and Pulmonary Rehab  Date  12/16/15  Educator  LB  Instruction Review Code  2- meets goals/outcomes      AED/CPR: - Group verbal and written instruction with the use of models to demonstrate the basic use of the AED with the basic ABC's of resuscitation. Flowsheet Row Pulmonary Rehab from 03/08/2016 in [facility] Cardiac and Pulmonary Rehab  Date  12/26/15  Educator  CE  Instruction Review Code  2- meets goals/outcomes      Breathing Retraining: - Provides individuals verbal and written instruction on purpose, frequency, and proper technique of diaphragmatic breathing and pursed-lipped breathing. Applies individual practice skills. Flowsheet Row Pulmonary Rehab from 03/08/2016 in [facility] Cardiac and Pulmonary Rehab  Date  12/24/15  Educator  LB  Instruction Review Code  2- meets goals/outcomes      Anatomy and Physiology of the Lungs: - Group verbal and written instruction with the use of models to provide basic lung anatomy and  physiology related to function, structure and complications of lung disease. Flowsheet Row Pulmonary Rehab from 03/08/2016 in [facility] Cardiac and Pulmonary Rehab  Date  01/16/16  Educator  [facility]  Instruction Review Code  2- meets goals/outcomes      Heart Failure: - Group verbal and written instruction on the basics of heart failure: signs/symptoms, treatments, explanation of ejection fraction, enlarged heart and cardiomyopathy. Flowsheet Row Pulmonary Rehab from 03/08/2016 in [facility] Cardiac and Pulmonary Rehab  Date  01/23/16  Educator  CE  Instruction Review Code  2- meets goals/outcomes      Sleep Apnea: - Individual verbal and written instruction to review Obstructive Sleep Apnea. Review of risk factors, methods for diagnosing and types of masks and machines for OSA.   Anxiety: - Provides group, verbal and written instruction on the correlation between heart/lung disease and anxiety, treatment options, and management of anxiety. Flowsheet Row Pulmonary Rehab from 03/08/2016 in [facility] Cardiac and Pulmonary Rehab  Date  02/25/16  Educator  [facility]  Instruction Review Code  2- Meets goals/outcomes      Relaxation: - Provides group, verbal and written instruction about the benefits of relaxation for patients with heart/lung disease. Also provides patients with examples of relaxation techniques. Flowsheet Row Pulmonary Rehab from 03/08/2016 in [facility] Cardiac and Pulmonary Rehab  Date  03/01/16  Educator  [facility]  Instruction Review Code  2- Meets goals/outcomes      Knowledge Questionnaire Score:     Knowledge Questionnaire Score - 12/16/15 1508      Knowledge Questionnaire Score   Pre Score 8/10       Core Components/Risk Factors/Patient Goals at Admission:     Personal Goals and Risk Factors at Admission - 12/16/15 1517      Core Components/Risk Factors/Patient Goals on Admission   Sedentary Yes  Residents at [facility], so access to their gym facility   Intervention Provide  advice, education, support and counseling about physical activity/exercise needs.;Develop an individualized exercise prescription for aerobic and resistive training based on initial evaluation findings, risk stratification, comorbidities and participant's personal goals.   Expected Outcomes Achievement of increased cardiorespiratory fitness and enhanced flexibility, muscular endurance and strength shown through measurements of functional capacity and personal statement of participant.   Increase Strength and Stamina Yes   Intervention Provide advice, education, support and counseling about physical activity/exercise needs.;Develop an individualized exercise prescription for aerobic and resistive training based  on initial evaluation findings, risk stratification, comorbidities and participant's personal goals.   Expected Outcomes Achievement of increased cardiorespiratory fitness and enhanced flexibility, muscular endurance and strength shown through measurements of functional capacity and personal statement of participant.   Improve shortness of breath with ADL's Yes   Intervention Provide education, individualized exercise plan and daily activity instruction to help decrease symptoms of SOB with activities of daily living.   Expected Outcomes Short Term: Achieves a reduction of symptoms when performing activities of daily living.   Develop more efficient breathing techniques such as purse lipped breathing and diaphragmatic breathing; and practicing self-pacing with activity Yes   Intervention Provide education, demonstration and support about specific breathing techniuqes utilized for more efficient breathing. Include techniques such as pursed lipped breathing, diaphragmatic breathing and self-pacing activity.   Expected Outcomes Short Term: Participant will be able to demonstrate and use breathing techniques as needed throughout daily activities.   Increase knowledge of respiratory medications and  ability to use respiratory devices properly  Yes  Uses SVN with Albuterol, Brovana, and Pulmicort; uses Spiriva; Albuterol MDI and spacer given. Oxygen 4-5l/m; portable concentrator   Intervention Provide education and demonstration as needed of appropriate use of medications, inhalers, and oxygen therapy.   Expected Outcomes Short Term: Achieves understanding of medications use. Understands that oxygen is a medication prescribed by physician. Demonstrates appropriate use of inhaler and oxygen therapy.   Personal Goal Other Yes   Personal Goal Able to go shopping again   Intervention Provide exercise prescription and education to increase functional capacity and improved shortness of breath   Expected Outcomes Able to gain strength and stamina to shopping.      Core Components/Risk Factors/Patient Goals Review:      Goals and Risk Factor Review    Row Name 12/24/15 1401 01/09/16 1057 01/09/16 1406 01/09/16 1408 01/09/16 1410     Core Components/Risk Factors/Patient Goals Review   Personal Goals Review Develop more efficient breathing techniques such as purse lipped breathing and diaphragmatic breathing and practicing self-pacing with activity. Weight Management/Obesity Improve shortness of breath with ADL's Increase knowledge of respiratory medications and ability to use respiratory devices properly. Develop more efficient breathing techniques such as purse lipped breathing and diaphragmatic breathing and practicing self-pacing with activity.   Review Reivewed PLB with pt today.  Reviewed technique and how it can help with reducing shortness of breath. [name] [name] said it is hard for her to lose weight. [name] [name] said "it will probably help me to lose weight with the exercise in [facility]. Discussed writing down her food intake as one option when she asked me how I keep my weight down. [name]. [name] is seeing progress in her ADL's.  She says that it is easier for her to walk farther and with less SOB since  she has started Pulmonary Rehab.  [name] [name] takes Proair, Brovana, Pulmicort and Spiriva for her respiratory disease.  She has a spacer to use with the Proair.  She understands how to take all of her medications and does not have any questions at this time.  [name]. [name] demonstrated the proper technique for PLB while exercising.   Expected Outcomes Able to use PLB independently without queuing WEight loss of 5lbs. Continued exercise should help her progress with ADL's and decrease her SOB even further.  Continuing to take her medications as prescribed and when needed with help [name]. [name] decrease her exacerbations and SOB. Understanding the proper technique will help her recover from SOB, exaberations and  anxiety if these issues occur.    [place] Name 01/12/16 1106 02/09/16 1411 02/13/16 1517 02/13/16 1519 02/13/16 1522     Core Components/Risk Factors/Patient Goals Review   Personal Goals Review Increase Strength and Stamina;Sedentary Sedentary;Weight Management/Obesity;Increase Strength and Stamina Improve shortness of breath with ADL's Increase knowledge of respiratory medications and ability to use respiratory devices properly. Develop more efficient breathing techniques such as purse lipped breathing and diaphragmatic breathing and practicing self-pacing with activity.   Review Home exercise was reviewed with the patient. She has not yet started home exercise but has checked into a fitness complex at her residence. She reported improvements in strength and stamina, as well as having a better attitude.  Shaindel is feeling much better overall; not as depressed.  She states she stil doesnt have a lot of energy - has trouble if she sits too long after meals - sometimes short of breath.  She wants to work on her waist. Yvaine is finding it easier to walk longer distances without so much SOB. Lakeasha takes Albuterol MDI, Brovana, Pulmicort, Spiriva and ProAir at home for her respiratory issues.  She has a  spacer for her MDIs and knowes to rinse her mouth after taking the seroid inhalers.  She also wears 4L O2 at all times.  Adalyn demonstrates proper technique of PLB.   Expected Outcomes Patient will start home exercise at the complex at her residence, increasing her frequency of exercise, which will aid in her continued progress of increasing her strength and stamina.  Shantina will continue to improve her fitness and see imoprovement in energy levels. Continued strength training and cardiovascular exercise will help Maridee's SOB improve with ADLs.  Taking her respiratory medications as prescribed by her doctor will help decrease Tallula's SOB and chances of exacerbation.  PLB will help Vicenta regain her breath during exercise.    [place] Name 02/25/16 1504 03/03/16 1457           Core Components/Risk Factors/Patient Goals Review   Personal Goals Review Increase knowledge of respiratory medications and ability to use respiratory devices properly.;Improve shortness of breath with ADL's;Develop more efficient breathing techniques such as purse lipped breathing and diaphragmatic breathing and practicing self-pacing with activity. Weight Management/Obesity;Sedentary;Develop more efficient breathing techniques such as purse lipped breathing and diaphragmatic breathing and practicing self-pacing with activity.;Improve shortness of breath with ADL's;Increase knowledge of respiratory medications and ability to use respiratory devices properly.;Increase Strength and Stamina      Review [name] Saldierna has had increased shortness of breath and is pacing and using PLB. Today she took her rescue inhale during LW. She used her spacer, but did  realize she should wait in between puffs for about  a minute. She was educated on the benefits of waiting and will take her MDI as directed. [name] Sauve will graduate next week. She continues to manage her weight with healthy eatting and manage her COPD with PLB and pacing. [name] Albro has  good understanding of her oxygen and MDI's and the importance of exercise. She will exercise at the gym facility at [facility] . [facility] has helped her to do more cooking and generally feel better about herself.      Expected Outcomes  -- Continue managing her COPD          Core Components/Risk Factors/Patient Goals at Discharge (Final Review):      Goals and Risk Factor Review - 03/03/16 1457      Core Components/Risk Factors/Patient Goals Review   Personal  Goals Review Weight Management/Obesity;Sedentary;Develop more efficient breathing techniques such as purse lipped breathing and diaphragmatic breathing and practicing self-pacing with activity.;Improve shortness of breath with ADL's;Increase knowledge of respiratory medications and ability to use respiratory devices properly.;Increase Strength and Stamina   Review [name] Hott will graduate next week. She continues to manage her weight with healthy eatting and manage her COPD with PLB and pacing. [name] Eckard has good understanding of her oxygen and MDI's and the importance of exercise. She will exercise at the gym facility at [facility] . [facility] has helped her to do more cooking and generally feel better about herself.   Expected Outcomes Continue managing her COPD       ITP Comments:     ITP Comments    Row Name 01/12/16 1113 02/18/16 1255 03/05/16 1217 03/15/16 1143     ITP Comments [name]. Conway lost her balance during stretching exercises and did fall. She fell back on her buttocks and her head hit several strands of soft resistive bands and bruised her right forarm.  She repored that she felt ok. Safety report will be filed. Jennings is progressing well with exercise. Reyna did not get to use her nebulizer before coming to class.  Collie Siad adminisitered a breathing treatment and her breathing sounds improved. Idona left eraly today to go to the Dr for possible sinus/neck problem.       Comments: [name] Moronta  graduated from  [employer] today and plans to continue exercise at her [employer].

## 2016-03-17 NOTE — Progress Notes (Signed)
Daily Session Note  Patient Details  Name: [name] [name] MRN: [id number] Date of Birth: [birth year] Referring Provider:   Flowsheet Row Pulmonary Rehab from 12/16/2015 in [facility] Cardiac and Pulmonary Rehab  Referring Provider  [name] Maffucci MD      Encounter Date: 03/17/2016  Check In:     Session Check In - 03/17/16 1303      Check-In   Location [facility]-Cardiac & Pulmonary Rehab   Staff Present [name] [name], BS, RRT, Respiratory [name] Hummer, MA, ACSM RCEP, Exercise Physiologist;[name] [name], BA, ACSM CEP, Exercise Physiologist   Supervising physician immediately available to respond to emergencies [facility] immediately available ER MD   Physician(s) [name] [name] and [name] [name]   Medication changes reported     No   Fall or balance concerns reported    No   Warm-up and Cool-down Performed as group-led Location manager Performed Yes   VAD Patient? No     Pain Assessment   Currently in Pain? No/denies   Multiple Pain Sites No           Exercise Prescription Changes - 03/16/16 1500      Exercise Review   Progression Yes     Response to Exercise   Blood Pressure (Admit) 150/82   Blood Pressure (Exercise) 152/76   Blood Pressure (Exit) 110/66   Heart Rate (Admit) 59 bpm   Heart Rate (Exercise) 89 bpm   Heart Rate (Exit) 77 bpm   Oxygen Saturation (Admit) 97 %   Oxygen Saturation (Exercise) 97 %   Oxygen Saturation (Exit) 96 %   Rating of Perceived Exertion (Exercise) 17   Perceived Dyspnea (Exercise) 4   Symptoms cold/sinus and neck pain   Duration Progress to 45 minutes of aerobic exercise without signs/symptoms of physical distress   Intensity THRR unchanged     Progression   Progression Continue to progress workloads to maintain intensity without signs/symptoms of physical distress.     Resistance Training   Training Prescription Yes   Weight 3   Reps 10-15     Treadmill   MPH 2   Grade 1.5   Minutes 15   METs 2.94     NuStep   Level 5   Minutes 15   METs 1.8     REL-XR   Level 7   Minutes 15      Goals Met:  Exercise tolerated well Strength training completed today  Goals Unmet:  Not Applicable  Comments:  [name] [name] graduated today from pulmonary rehab with 36 sessions completed.  Details of the patient's exercise prescription and what she needs to do in order to continue the prescription and progress were discussed with patient.  Patient was given a copy of prescription and goals.  Patient verbalized understanding.  [name] [name] plans to continue to exercise by using the fitness center where she lives..    Dr. Emily [name] is Medical Director for [place] and [facility].

## 2016-03-23 ENCOUNTER — Other Ambulatory Visit: Payer: Self-pay | Admitting: Family Medicine

## 2016-03-29 DIAGNOSIS — J432 Centrilobular emphysema: Secondary | ICD-10-CM | POA: Diagnosis not present

## 2016-03-30 DIAGNOSIS — J432 Centrilobular emphysema: Secondary | ICD-10-CM | POA: Diagnosis not present

## 2016-04-15 DIAGNOSIS — R0902 Hypoxemia: Secondary | ICD-10-CM | POA: Diagnosis not present

## 2016-04-15 DIAGNOSIS — J309 Allergic rhinitis, unspecified: Secondary | ICD-10-CM | POA: Diagnosis not present

## 2016-04-19 ENCOUNTER — Encounter: Payer: Self-pay | Admitting: Internal Medicine

## 2016-04-19 ENCOUNTER — Ambulatory Visit (INDEPENDENT_AMBULATORY_CARE_PROVIDER_SITE_OTHER): Payer: Medicare Other | Admitting: Internal Medicine

## 2016-04-19 VITALS — BP 132/80 | HR 79 | Temp 98.0°F | Wt 158.0 lb

## 2016-04-19 DIAGNOSIS — J0140 Acute pansinusitis, unspecified: Secondary | ICD-10-CM | POA: Diagnosis not present

## 2016-04-19 MED ORDER — DOXYCYCLINE HYCLATE 100 MG PO TABS: 100.0000 mg | ORAL_TABLET | Freq: Two times a day (BID) | ORAL | 0 refills | Status: DC

## 2016-04-19 NOTE — Progress Notes (Signed)
Pre visit review using our clinic review tool, if applicable. No additional management support is needed unless otherwise documented below in the visit note. 

## 2016-04-19 NOTE — Progress Notes (Signed)
Subjective:    [name], female    DOB: [birth year], 75 y.o.   MRN: [id number]  HPI Here due to left ear pain Started about a week ago Pain radiates down towards jaw Tender on left neck--but no mass Some pain with swallowing  May have had low grade fever Slight chills Chronic sinus drainage---down throat Yellow--- when she coughs it up  Hasn't tried anything for this Hearing is the same  Current Outpatient Prescriptions on File Prior to Visit  Medication Sig Dispense Refill  . albuterol (PROAIR HFA) 108 (90 BASE) MCG/ACT inhaler Inhale 2 puffs into the lungs every 4 (four) hours as needed. 1 Inhaler 5  . albuterol (PROVENTIL) (2.5 MG/3ML) 0.083% nebulizer solution Take every 4-6 hours as needed for shortness of breath 75 mL 5  . arformoterol (BROVANA) 15 MCG/2ML NEBU Take 2 mLs (15 mcg total) by nebulization 2 (two) times daily. 120 mL 11  . aspirin EC 81 MG tablet Take 1 tablet (81 mg total) by mouth every Monday, Wednesday, and Friday.    . budesonide (PULMICORT) 0.5 MG/2ML nebulizer solution Take 2 mLs (0.5 mg total) by nebulization 2 (two) times daily. 60 mL 12  . buPROPion (WELLBUTRIN SR) 150 MG 12 hr tablet TAKE ONE TABLET BY MOUTH TWICE DAILY 60 tablet 6  . conjugated estrogens (PREMARIN) vaginal cream Place 1 Applicatorful vaginally daily. Apply 0.'5mg'$  (pea-sized amount)  just inside the vaginal introitus with a finger-tip every night for two weeks and then Monday, Wednesday and Friday nights. 30 g 12  . CRANBERRY PO Take 2 tablets by mouth every morning.     Marland Kitchen estradiol (ESTRACE VAGINAL) 0.1 MG/GM vaginal cream Apply 0.'5mg'$  (pea-sized amount)  just inside the vaginal introitus with a finger-tip every night for two weeks and then Monday, Wednesday and Friday nights. 30 g 12  . guaifenesin (HUMIBID E) 400 MG TABS tablet Take 400 mg by mouth every 4 (four) hours.    Marland Kitchen losartan (COZAAR) 50 MG tablet TAKE ONE TABLET BY MOUTH EVERY DAY 30 tablet 11  . metoprolol  tartrate (LOPRESSOR) 25 MG tablet TAKE ONE TABLET BY MOUTH TWICE DAILY 60 tablet 11  . MYRBETRIQ 25 MG TB24 tablet TAKE ONE TABLET BY MOUTH EVERY DAY 30 tablet 11  . omeprazole (PRILOSEC) 40 MG capsule TAKE ONE CAPSULE BY MOUTH EVERY DAY 30 capsule 11  . pravastatin (PRAVACHOL) 80 MG tablet ONE TABLET BY MOUTH AT BEDTIME 90 tablet 1  . psyllium (METAMUCIL) 58.6 % powder Take 1 packet by mouth at bedtime.     . sertraline (ZOLOFT) 100 MG tablet Take one tablet in the morning and one-half tablet at bedtime 45 tablet 3  . SPIRIVA HANDIHALER 18 MCG inhalation capsule INHALE CONTENTS OF 1 CAPSULE ONCE DAILY. 30 capsule 5  . vitamin B-12 (CYANOCOBALAMIN) 1000 MCG tablet Take 1,000 mcg by mouth daily.    . benzonatate (TESSALON) 100 MG capsule Take by mouth 3 (three) times daily as needed for cough.     No current facility-administered medications on file prior to visit.     Allergies  Allergen Reactions  . Keflex [Cephalexin] Shortness Of Breath  . Penicillins     REACTION: hives    Past Medical History:  Diagnosis Date  . Chronic cystitis    recurrent UTI (4+ in last year), on suppressive doxy (uro Cope)  . Colon polyp   . 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 Depression with anxiety   .  Diverticular disease 10/09/09   CT abd no sigmoid mass DDD L5/S1  . Dyslipidemia   . GERD (gastroesophageal reflux disease)   . Hematuria    h/o kidney stones  . Hiatal hernia   . Hypertension   . Left scapholunate ligament tear 2014   found on imaging  . Lung cancer ([place]) 1987   Partial RLL Lobectomy.  . Osteoarthritis   . Osteopenia 08/2011   DEXA: L femur -1.6  . Peptic ulcer   . Spinal abscess ([place]) 2000   secondary to periodontal disease    Past Surgical History:  Procedure Laterality Date  . BLADDER SURGERY    . BREAST BIOPSY Left 03/99   neg  . CHOLECYSTECTOMY  02/10/09  . COLONOSCOPY WITH PROPOFOL N/A 04/30/2014   Procedure: COLONOSCOPY WITH PROPOFOL;  Surgeon: Irene Shipper, MD;   Location: [facility];  Service: Endoscopy;  Laterality: N/A;  . CYSTOSCOPY  02/11/07 and 09/25/08   Dr. Jacqlyn Larsen, normal  . DEXA  08/2011   T femur -1.6, T spine -0.6  . [place] pneumonia sepsis due to gum surgery  . REPLACEMENT TOTAL KNEE  09/2009   Right  . TOTAL ABDOMINAL HYSTERECTOMY  07/18/01   benign reason, BSO, vag repair secondary prolapse  . TUBAL LIGATION     bilateral    Family History  Problem Relation Age of Onset  . Pancreatitis Mother   . Heart disease Father   . Hypertension Father   . Emphysema Father   . Arthritis Brother   . Hypertension Brother   . Cancer Maternal Aunt     ovarian cancer  . Kidney disease Neg Hx     Social History   Social History  . Marital status: Married    Spouse name: N/A  . Number of children: 2  . Years of education: N/A   Occupational History  . Retired Retired    prior Glass blower/designer at [employer] History Main Topics  . Smoking status: Former Smoker    Packs/day: 3.00    Years: 34.00    Types: Cigarettes    Quit date: 05/29/1995  . Smokeless tobacco: Never Used  . Alcohol use 0.0 oz/week     Comment: wine daily 2-3 glasses/day  . Drug use: No  . Sexual activity: Not on file   Other Topics Concern  . Not on file   Social History Narrative   Caffeine: 4 cups coffee   Remarried for the third time, 2nd husband died of heart attack. Two stepsons   Activity: Walks for exercise   Diet: good water, fruits/vegetables daily      Advanced directed - would want HCPOA to be husband, Karlton Lemon. Does not have set up - requests handout.    Review of Systems Usual SOB--no increase No rash No vomiting but has some diarrhea ("it dumps down there after eating" ). Not new Appetite is okay    Objective:   Physical Exam  Constitutional: She appears well-developed and well-nourished. No distress.  HENT:  Mouth/Throat: Oropharynx is clear and moist. No oropharyngeal exudate.  Eyes:    TMs normal Canals normal Slight nasal inflammation  Neck: Normal range of motion.  Tender anterior cervical node on the right  Pulmonary/Chest:  Decreased breath sounds but clear          Assessment & Plan:

## 2016-04-19 NOTE — Assessment & Plan Note (Signed)
Has tender node and pressure in ear from this Will go ahead with treatment with doxy (due to allergies)

## 2016-04-19 NOTE — Patient Instructions (Signed)
If you feel better quickly, you can stop the antibiotic after 5-7 days.

## 2016-04-28 ENCOUNTER — Encounter: Payer: Self-pay | Admitting: Urology

## 2016-04-28 ENCOUNTER — Ambulatory Visit: Payer: Medicare Other | Admitting: Urology

## 2016-04-28 VITALS — BP 136/61 | HR 65 | Ht 65.0 in | Wt 157.7 lb

## 2016-04-28 DIAGNOSIS — N952 Postmenopausal atrophic vaginitis: Secondary | ICD-10-CM | POA: Diagnosis not present

## 2016-04-28 DIAGNOSIS — N390 Urinary tract infection, site not specified: Secondary | ICD-10-CM | POA: Diagnosis not present

## 2016-04-28 DIAGNOSIS — N3941 Urge incontinence: Secondary | ICD-10-CM | POA: Diagnosis not present

## 2016-04-28 LAB — BLADDER SCAN AMB NON-IMAGING: Scan Result: 17

## 2016-04-28 MED ORDER — MIRABEGRON ER 50 MG PO TB24: 50.0000 mg | ORAL_TABLET | Freq: Every day | ORAL | 12 refills | Status: DC

## 2016-04-28 MED ORDER — ESTRADIOL 0.1 MG/GM VA CREA: TOPICAL_CREAM | VAGINAL | 12 refills | Status: DC

## 2016-04-28 NOTE — Progress Notes (Signed)
04/28/2016 3:11 PM   [name] [name] Dec 12, 1940 [id number]  Referring provider: Ria Bush, MD [address] [place], [place] [address]  Chief Complaint  Patient presents with  . Vaginal Atrophy    3 month follow up  . Urinary Incontinence    Urge    HPI: Patient is a 75 -year-old Caucasian female who presents today for three month follow-up after for vaginal atrophy, a history of recurrent urinary tract infections and urge incontinence.     Vaginal atrophy Patient is using her vaginal estrogen cream 3 nights weekly. She has not had an UTI since her last visit with [place].    History of recurrent urinary tract infections Patient was referred to [place] by, Dr. Danise Mina, for recurrent urinary tract infections.  She does have a history of GU surgery (bladder sling with Dr. Jacqlyn Larsen 2005).  Reviewing her records,  she has had three infections.  Each positive for E. Coli.  KUB performed on 01/13/2016 noted moderately increased colonic stool burden reflecting constipation. No definite urinary tract stones were seen.   Renal ultrasound performed on 01/23/2016 noted no hydronephrosis or other acute abnormalities of the kidneys.   She has not had an UTI since her last visit with [place].   Urge incontinence Her PVR today is 17 mL.  She is having urgency and urge incontinence.  She is finding the Myrbetriq 50 mg daily very effective.  She would like to continue the medication.     PMH: Past Medical History:  Diagnosis Date  . Chronic cystitis    recurrent UTI (4+ in last year), on suppressive doxy (uro Cope)  . Colon polyp   . 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 Depression with anxiety   . Diverticular disease 10/09/09   CT abd no sigmoid mass DDD L5/S1  . Dyslipidemia   . GERD (gastroesophageal reflux disease)   . Hematuria    h/o kidney stones  . Hiatal hernia   . Hypertension   . Left scapholunate ligament tear 2014   found on imaging  . Lung cancer ([place]) 1987   Partial RLL  Lobectomy.  . Osteoarthritis   . Osteopenia 08/2011   DEXA: L femur -1.6  . Peptic ulcer   . Spinal abscess ([place]) 2000   secondary to periodontal disease    Surgical History: Past Surgical History:  Procedure Laterality Date  . BLADDER SURGERY    . BREAST BIOPSY Left 03/99   neg  . CHOLECYSTECTOMY  02/10/09  . COLONOSCOPY WITH PROPOFOL N/A 04/30/2014   Procedure: COLONOSCOPY WITH PROPOFOL;  Surgeon: Irene Shipper, MD;  Location: [facility];  Service: Endoscopy;  Laterality: N/A;  . CYSTOSCOPY  02/11/07 and 09/25/08   Dr. Jacqlyn Larsen, normal  . DEXA  08/2011   T femur -1.6, T spine -0.6  . [place] pneumonia sepsis due to gum surgery  . REPLACEMENT TOTAL KNEE  09/2009   Right  . TOTAL ABDOMINAL HYSTERECTOMY  07/18/01   benign reason, BSO, vag repair secondary prolapse  . TUBAL LIGATION     bilateral    Home Medications:    Medication List       Accurate as of 04/28/16  3:11 PM. Always use your most recent med list.          albuterol 108 (90 Base) MCG/ACT inhaler Commonly known as:  PROAIR HFA Inhale 2 puffs into the lungs every 4 (four) hours as needed.  albuterol (2.5 MG/3ML) 0.083% nebulizer solution Commonly known as:  PROVENTIL Take every 4-6 hours as needed for shortness of breath   arformoterol 15 MCG/2ML Nebu Commonly known as:  BROVANA Take 2 mLs (15 mcg total) by nebulization 2 (two) times daily.   aspirin EC 81 MG tablet Take 1 tablet (81 mg total) by mouth every Monday, Wednesday, and Friday.   benzonatate 100 MG capsule Commonly known as:  TESSALON Take by mouth 3 (three) times daily as needed for cough.   budesonide 0.5 MG/2ML nebulizer solution Commonly known as:  PULMICORT Take 2 mLs (0.5 mg total) by nebulization 2 (two) times daily.   buPROPion 150 MG 12 hr tablet Commonly known as:  WELLBUTRIN SR TAKE ONE TABLET BY MOUTH TWICE DAILY   conjugated estrogens vaginal cream Commonly known as:  PREMARIN Place 1  Applicatorful vaginally daily. Apply 0.'5mg'$  (pea-sized amount)  just inside the vaginal introitus with a finger-tip every night for two weeks and then Monday, Wednesday and Friday nights.   CRANBERRY PO Take 2 tablets by mouth every morning.   doxycycline 100 MG tablet Commonly known as:  VIBRA-TABS Take 1 tablet (100 mg total) by mouth 2 (two) times daily.   estradiol 0.1 MG/GM vaginal cream Commonly known as:  ESTRACE VAGINAL Apply 0.'5mg'$  (pea-sized amount)  just inside the vaginal introitus with a finger-tip every night for two weeks and then Monday, Wednesday and Friday nights.   guaifenesin 400 MG Tabs tablet Commonly known as:  HUMIBID E Take 400 mg by mouth every 4 (four) hours.   losartan 50 MG tablet Commonly known as:  COZAAR TAKE ONE TABLET BY MOUTH EVERY DAY   metoprolol tartrate 25 MG tablet Commonly known as:  LOPRESSOR TAKE ONE TABLET BY MOUTH TWICE DAILY   MYRBETRIQ 25 MG Tb24 tablet Generic drug:  mirabegron ER TAKE ONE TABLET BY MOUTH EVERY DAY   omeprazole 40 MG capsule Commonly known as:  PRILOSEC TAKE ONE CAPSULE BY MOUTH EVERY DAY   pravastatin 80 MG tablet Commonly known as:  PRAVACHOL ONE TABLET BY MOUTH AT BEDTIME   psyllium 58.6 % powder Commonly known as:  METAMUCIL Take 1 packet by mouth at bedtime.   sertraline 100 MG tablet Commonly known as:  ZOLOFT Take one tablet in the morning and one-half tablet at bedtime   SPIRIVA HANDIHALER 18 MCG inhalation capsule Generic drug:  tiotropium INHALE CONTENTS OF 1 CAPSULE ONCE DAILY.   vitamin B-12 1000 MCG tablet Commonly known as:  CYANOCOBALAMIN Take 1,000 mcg by mouth daily.       Allergies:  Allergies  Allergen Reactions  . Keflex [Cephalexin] Shortness Of Breath  . Penicillins     REACTION: hives    Family History: Family History  Problem Relation Age of Onset  . Pancreatitis Mother   . Heart disease Father   . Hypertension Father   . Emphysema Father   . Arthritis Brother    . Hypertension Brother   . Cancer Maternal Aunt     ovarian cancer  . Kidney disease Neg Hx   . Prostate cancer Neg Hx   . Bladder Cancer Neg Hx     Social History:  reports that she quit smoking about 20 years ago. Her smoking use included Cigarettes. She has a 102.00 pack-year smoking history. She has never used smokeless tobacco. She reports that she drinks alcohol. She reports that she does not use drugs.  ROS: UROLOGY Frequent Urination?: No Hard to postpone urination?: Yes Burning/pain with  urination?: No Get up at night to urinate?: Yes Leakage of urine?: Yes Urine stream starts and stops?: No Trouble starting stream?: No Do you have to strain to urinate?: No Blood in urine?: No Urinary tract infection?: No Sexually transmitted disease?: No Injury to kidneys or bladder?: No Painful intercourse?: Yes Weak stream?: No Currently pregnant?: No Vaginal bleeding?: No Last menstrual period?: n  Gastrointestinal Nausea?: No Vomiting?: No Indigestion/heartburn?: No Diarrhea?: No Constipation?: No  Constitutional Fever: No Night sweats?: No Weight loss?: No Fatigue?: Yes  Skin Skin rash/lesions?: No Itching?: No  Eyes Blurred vision?: No Double vision?: No  Ears/Nose/Throat Sore throat?: No Sinus problems?: Yes  Hematologic/Lymphatic Swollen glands?: Yes Easy bruising?: Yes  Cardiovascular Leg swelling?: No Chest pain?: No  Respiratory Cough?: No Shortness of breath?: Yes  Endocrine Excessive thirst?: No  Musculoskeletal Back pain?: No Joint pain?: Yes  Neurological Headaches?: No Dizziness?: No  Psychologic Depression?: No Anxiety?: No  Physical Exam: BP 136/61   Pulse 65   Ht '5\' 5"'$  (1.651 m)   Wt 157 lb 11.2 oz (71.5 kg)   BMI 26.24 kg/m   Constitutional: Well nourished. Alert and oriented, No acute distress. HEENT: [place] AT, moist mucus membranes. Trachea midline, no masses. Cardiovascular: No clubbing, cyanosis, or  edema. Respiratory: Normal respiratory effort, no increased work of breathing. Skin: No rashes, bruises or suspicious lesions. Lymph: No cervical or inguinal adenopathy. Neurologic: Grossly intact, no focal deficits, moving all 4 extremities. Psychiatric: Normal mood and affect.  Laboratory Data: Lab Results  Component Value Date   WBC 6.4 02/25/2015   HGB 13.7 02/25/2015   HCT 41.3 02/25/2015   MCV 92.2 02/25/2015   PLT 147.0 (L) 02/25/2015    Lab Results  Component Value Date   CREATININE 0.71 09/02/2015     Lab Results  Component Value Date   HGBA1C 5.5 02/13/2014    Lab Results  Component Value Date   TSH 1.43 02/25/2015       Component Value Date/Time   CHOL 147 09/02/2015 1153   HDL 67.90 09/02/2015 1153   CHOLHDL 2 09/02/2015 1153   VLDL 22.6 09/02/2015 1153   LDLCALC 57 09/02/2015 1153    Lab Results  Component Value Date   AST 17 09/02/2015   Lab Results  Component Value Date   ALT 12 09/02/2015     Pertinent Imaging: CLINICAL DATA:  Pelvic pain for the past 3 weeks. Hematuria in the past but not currently. History of recurrent urinary tract infections.  EXAM: ABDOMEN - 1 VIEW  COMPARISON:  KUB dated Oct 19, 2009  FINDINGS: The colonic stool burden is mildly increased. No excessive gas is observed. There is no small or large bowel obstructive pattern. There is stool and gas in the rectum. No definite stones overlie the kidneys. There are calcifications in the upper aspect of the true bony pelvis which likely lie in the iliac arteries. No definite ureteral stones are observed today. There are surgical clips in the gallbladder fossa. There is blunting of the right lateral costophrenic angle. There is degenerative change of the lower lumbar spine and of the left hip.  IMPRESSION: Moderately increased colonic stool burden may reflect constipation in the appropriate clinical setting. No definite urinary tract stones. If the patient's  symptoms persist, abdominal and pelvic CT scanning would be a useful next imaging step.   Electronically Signed   By: David  Martinique M.D.   On: 01/13/2016 15:08  CLINICAL DATA:  Two years of bilateral flank  pain.  EXAM: RENAL / URINARY TRACT ULTRASOUND COMPLETE  COMPARISON:  Abdominal and pelvic CT scan of Oct 09, 2009  FINDINGS: Right Kidney:  Length: 9.5 cm. The renal cortical echotexture remains lower than that of the adjacent liver. There is no hydronephrosis. No stones are evident.  Left Kidney:  Length: 10.9 cm. The renal cortical echotexture remains lower than that of the adjacent liver. There is no hydronephrosis. No stones are observed.  Bladder:  The partially distended urinary bladder is normal. Bilateral ureteral jets are observed.  IMPRESSION: No hydronephrosis nor other acute abnormality of either kidney.   Electronically Signed   By: David  Martinique M.D.   On: 01/23/2016 14:48  Results for PAVIELLE, BIGGAR (MRN [id number]) as of 04/28/2016 15:13  Ref. Range 04/28/2016 15:06  Scan Result Unknown 17    Assessment & Plan:    1. Recurrent UTI  - Reviewed with preventative strategies   - Reminded the patient to contact our office with symptoms of urinary tract infections   - Reminded patient that if we are unavailable that she should request a catheterized specimen   2. Vaginal atrophy  - Patient will continue the vaginal estrogen cream 3 nights weekly  - Sample of Estrace cream is given; refills given  - Patient is encouraged to contact our office for samples when she is unable to acquire the cream  - RTC in one year for exam  3. Urge incontinence  - BLADDER SCAN AMB NON-IMAGING  - Prescription for Myrbetriq 50 mg as sent to pharmacy  - Patient given samples of her Myrbetriq 50 mg  - RTC in one year for PVR and OAB questionnaire  Return in about 1 year (around 04/28/2017) for PVR, exam and OAB questionnaire.  These notes generated  with voice recognition software. I apologize for typographical errors.  Zara Council, [facility] [address] [place], [place] [address] [phone]

## 2016-04-29 ENCOUNTER — Ambulatory Visit: Payer: Medicare Other | Admitting: Cardiovascular Disease

## 2016-04-30 ENCOUNTER — Ambulatory Visit: Payer: Medicare Other | Admitting: Cardiovascular Disease

## 2016-05-10 DIAGNOSIS — M79674 Pain in right toe(s): Secondary | ICD-10-CM | POA: Diagnosis not present

## 2016-05-10 DIAGNOSIS — L600 Ingrowing nail: Secondary | ICD-10-CM | POA: Diagnosis not present

## 2016-05-11 ENCOUNTER — Ambulatory Visit (INDEPENDENT_AMBULATORY_CARE_PROVIDER_SITE_OTHER): Payer: Medicare Other | Admitting: Family Medicine

## 2016-05-11 ENCOUNTER — Encounter: Payer: Self-pay | Admitting: Family Medicine

## 2016-05-11 VITALS — BP 130/68 | HR 70 | Temp 98.2°F | Ht 65.0 in | Wt 155.5 lb

## 2016-05-11 DIAGNOSIS — J069 Acute upper respiratory infection, unspecified: Secondary | ICD-10-CM | POA: Diagnosis not present

## 2016-05-11 DIAGNOSIS — J441 Chronic obstructive pulmonary disease with (acute) exacerbation: Secondary | ICD-10-CM

## 2016-05-11 MED ORDER — BENZONATATE 100 MG PO CAPS: 100.0000 mg | ORAL_CAPSULE | Freq: Three times a day (TID) | ORAL | 0 refills | Status: DC | PRN

## 2016-05-11 MED ORDER — DOXYCYCLINE HYCLATE 100 MG PO TABS: 100.0000 mg | ORAL_TABLET | Freq: Two times a day (BID) | ORAL | 0 refills | Status: DC

## 2016-05-11 MED ORDER — PREDNISONE 10 MG PO TABS: ORAL_TABLET | ORAL | 0 refills | Status: DC

## 2016-05-11 NOTE — Assessment & Plan Note (Signed)
Disc symptomatic care - see instructions on AVS  Cover with doxycycline in light of copd and risk of pneumonia Re assuring exam Prednisone px if needed  Tessalon prn cough  Update if not starting to improve in a week or if worsening

## 2016-05-11 NOTE — Progress Notes (Signed)
Subjective:    [name], female    DOB: May 04, 1941, 75 y.o.   MRN: [id number]  HPI Here with uri symptoms   Woke up Sunday night with a ST  Coughed all night long last night  Mucous is green/ brown  Has copd - not a lot of wheezing/ 3L of 02     Pulse ox is 94% and she will turn it up if needed Not a smoker  Wears 02 Lost voice yesterday (but st is improved)   Ears are itchy and popping Nose is congested  No facial pain   ? If temp elevation - last night did have some sweats   She uses spiriva nad pulmicort  Albuterol nmt 2-3 times per day   Used guifenesin otc  Used tessalon last night   Has tussionex at home   Patient Active Problem List   Diagnosis Date Noted  . URI with cough and congestion 05/11/2016  . Acute pansinusitis, unspecified 04/19/2016  . Trigeminal neuralgia of right side of face 11/18/2015  . COPD exacerbation ([place]) 07/29/2015  . CAD (coronary artery disease) 03/04/2015  . Health care maintenance 03/04/2015  . MAI (mycobacterium avium-intracellulare) ([place]) 10/23/2014  . Hx of colonic polyps 04/23/2014  . Advanced care planning/counseling discussion 02/18/2014  . Allergic rhinitis 02/05/2014  . MRSA colonization 02/05/2014  . Urinary urgency 03/23/2013  . Urinary incontinence 02/21/2013  . Recurrent UTI 07/27/2012  . Chest tightness 07/11/2012  . Chronic respiratory failure with hypoxia ([place]) 04/27/2012  . Medicare annual wellness visit, subsequent 09/10/2011  . Dyslipidemia 09/01/2011  . Osteopenia 08/23/2011  . Chronic fatigue 06/19/2008  . GOLD 4 COPD with emphysema 02/01/2008  . ANXIETY 11/21/2006  . MDD (major depressive disorder), recurrent episode, moderate ([place]) 11/21/2006  . Essential hypertension 11/21/2006  . Hyperglycemia 11/21/2006  . Abnormal blood chemistry level 11/21/2006   Past Medical History:  Diagnosis Date  . Chronic cystitis    recurrent UTI (4+ in last year), on suppressive doxy (uro Cope)  .  Colon polyp   . 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 Depression with anxiety   . Diverticular disease 10/09/09   CT abd no sigmoid mass DDD L5/S1  . Dyslipidemia   . GERD (gastroesophageal reflux disease)   . Hematuria    h/o kidney stones  . Hiatal hernia   . Hypertension   . Left scapholunate ligament tear 2014   found on imaging  . Lung cancer ([place]) 1987   Partial RLL Lobectomy.  . Osteoarthritis   . Osteopenia 08/2011   DEXA: L femur -1.6  . Peptic ulcer   . Spinal abscess ([place]) 2000   secondary to periodontal disease   Past Surgical History:  Procedure Laterality Date  . BLADDER SURGERY    . BREAST BIOPSY Left 03/99   neg  . CHOLECYSTECTOMY  02/10/09  . COLONOSCOPY WITH PROPOFOL N/A 04/30/2014   Procedure: COLONOSCOPY WITH PROPOFOL;  Surgeon: Irene Shipper, MD;  Location: [facility];  Service: Endoscopy;  Laterality: N/A;  . CYSTOSCOPY  02/11/07 and 09/25/08   Dr. Jacqlyn Larsen, normal  . DEXA  08/2011   T femur -1.6, T spine -0.6  . [place] pneumonia sepsis due to gum surgery  . REPLACEMENT TOTAL KNEE  09/2009   Right  . TOTAL ABDOMINAL HYSTERECTOMY  07/18/01   benign reason, BSO, vag repair secondary prolapse  . TUBAL LIGATION     bilateral  Social History  Substance Use Topics  . Smoking status: Former Smoker    Packs/day: 3.00    Years: 34.00    Types: Cigarettes    Quit date: 05/29/1995  . Smokeless tobacco: Never Used  . Alcohol use 0.0 oz/week     Comment: wine daily 2-3 glasses/day   Family History  Problem Relation Age of Onset  . Pancreatitis Mother   . Heart disease Father   . Hypertension Father   . Emphysema Father   . Arthritis Brother   . Hypertension Brother   . Cancer Maternal Aunt     ovarian cancer  . Kidney disease Neg Hx   . Prostate cancer Neg Hx   . Bladder Cancer Neg Hx    Allergies  Allergen Reactions  . Keflex [Cephalexin] Shortness Of Breath  . Penicillins     REACTION: hives   Current Outpatient  Prescriptions on File Prior to Visit  Medication Sig Dispense Refill  . albuterol (PROAIR HFA) 108 (90 BASE) MCG/ACT inhaler Inhale 2 puffs into the lungs every 4 (four) hours as needed. 1 Inhaler 5  . albuterol (PROVENTIL) (2.5 MG/3ML) 0.083% nebulizer solution Take every 4-6 hours as needed for shortness of breath 75 mL 5  . arformoterol (BROVANA) 15 MCG/2ML NEBU Take 2 mLs (15 mcg total) by nebulization 2 (two) times daily. 120 mL 11  . aspirin EC 81 MG tablet Take 1 tablet (81 mg total) by mouth every Monday, Wednesday, and Friday.    . budesonide (PULMICORT) 0.5 MG/2ML nebulizer solution Take 2 mLs (0.5 mg total) by nebulization 2 (two) times daily. 60 mL 12  . buPROPion (WELLBUTRIN SR) 150 MG 12 hr tablet TAKE ONE TABLET BY MOUTH TWICE DAILY 60 tablet 6  . conjugated estrogens (PREMARIN) vaginal cream Place 1 Applicatorful vaginally daily. Apply 0.'5mg'$  (pea-sized amount)  just inside the vaginal introitus with a finger-tip every night for two weeks and then Monday, Wednesday and Friday nights. 30 g 12  . CRANBERRY PO Take 2 tablets by mouth every morning.     Marland Kitchen estradiol (ESTRACE VAGINAL) 0.1 MG/GM vaginal cream Apply 0.'5mg'$  (pea-sized amount)  just inside the vaginal introitus with a finger-tip every night for two weeks and then Monday, Wednesday and Friday nights. 30 g 12  . guaifenesin (HUMIBID E) 400 MG TABS tablet Take 400 mg by mouth every 4 (four) hours.    Marland Kitchen losartan (COZAAR) 50 MG tablet TAKE ONE TABLET BY MOUTH EVERY DAY 30 tablet 11  . metoprolol tartrate (LOPRESSOR) 25 MG tablet TAKE ONE TABLET BY MOUTH TWICE DAILY 60 tablet 11  . mirabegron ER (MYRBETRIQ) 50 MG TB24 tablet Take 1 tablet (50 mg total) by mouth daily. 30 tablet 12  . omeprazole (PRILOSEC) 40 MG capsule TAKE ONE CAPSULE BY MOUTH EVERY DAY 30 capsule 11  . pravastatin (PRAVACHOL) 80 MG tablet ONE TABLET BY MOUTH AT BEDTIME 90 tablet 1  . psyllium (METAMUCIL) 58.6 % powder Take 1 packet by mouth at bedtime.     .  sertraline (ZOLOFT) 100 MG tablet Take one tablet in the morning and one-half tablet at bedtime 45 tablet 3  . SPIRIVA HANDIHALER 18 MCG inhalation capsule INHALE CONTENTS OF 1 CAPSULE ONCE DAILY. 30 capsule 5  . vitamin B-12 (CYANOCOBALAMIN) 1000 MCG tablet Take 1,000 mcg by mouth daily.     No current facility-administered medications on file prior to visit.      Review of Systems  Constitutional: Positive for appetite change and fatigue. Negative  for fever.  HENT: Positive for congestion, postnasal drip, rhinorrhea, sinus pressure, sneezing and sore throat. Negative for ear pain.   Eyes: Negative for pain and discharge.  Respiratory: Positive for cough and shortness of breath. Negative for wheezing and stridor.   Cardiovascular: Negative for chest pain.  Gastrointestinal: Negative for diarrhea, nausea and vomiting.  Genitourinary: Negative for frequency, hematuria and urgency.  Musculoskeletal: Negative for arthralgias and myalgias.  Skin: Negative for rash.  Neurological: Positive for headaches. Negative for dizziness, weakness and light-headedness.  Psychiatric/Behavioral: Negative for confusion and dysphoric mood.       Objective:   Physical Exam  Constitutional: She appears well-developed and well-nourished. No distress.  Well appearing elderly female wearing 02   HENT:  Head: Normocephalic and atraumatic.  Right Ear: External ear normal.  Left Ear: External ear normal.  Mouth/Throat: Oropharynx is clear and moist.  Nares are injected and congested  No sinus tenderness Clear rhinorrhea and post nasal drip   Eyes: Conjunctivae and EOM are normal. Pupils are equal, round, and reactive to light. Right eye exhibits no discharge. Left eye exhibits no discharge.  Neck: Normal range of motion. Neck supple.  Cardiovascular: Normal rate and normal heart sounds.   Pulmonary/Chest: Effort normal and breath sounds normal. No respiratory distress. She has no wheezes. She has no rales.  She exhibits no tenderness.  Diffusely distant bs Fair air exch Wheeze only on forced exp  Few scattered rhonchi No rales  Mildly prolonged exp phase Not sob Wearing 02 by [place]  Lymphadenopathy:    She has no cervical adenopathy.  Neurological: She is alert.  Skin: Skin is warm and dry. No rash noted.  Psychiatric: She has a normal mood and affect.          Assessment & Plan:   Problem List Items Addressed This Visit      Respiratory   URI with cough and congestion    Disc symptomatic care - see instructions on AVS  Cover with doxycycline in light of copd and risk of pneumonia Re assuring exam Prednisone px if needed  Tessalon prn cough  Update if not starting to improve in a week or if worsening        COPD exacerbation ([place]) - Primary    With uri/bronchitis  Continue inhalers Px for prednisone to fill if symptoms worsen  Continue 02      Relevant Medications   benzonatate (TESSALON) 100 MG capsule   predniSONE (DELTASONE) 10 MG tablet

## 2016-05-11 NOTE — Patient Instructions (Signed)
Take the doxycycline for bronchitis  Rest and get fluids  Take tessalon as needed for cough Also guaifenesin   If needed - fill the px for prednisone and keep [place] updated  Disc symptomatic care - see instructions on AVS

## 2016-05-11 NOTE — Progress Notes (Signed)
Pre visit review using our clinic review tool, if applicable. No additional management support is needed unless otherwise documented below in the visit note. 

## 2016-05-11 NOTE — Assessment & Plan Note (Signed)
With uri/bronchitis  Continue inhalers Px for prednisone to fill if symptoms worsen  Continue 02

## 2016-05-12 ENCOUNTER — Ambulatory Visit: Payer: Medicare Other | Admitting: Family Medicine

## 2016-05-15 DIAGNOSIS — J309 Allergic rhinitis, unspecified: Secondary | ICD-10-CM | POA: Diagnosis not present

## 2016-05-15 DIAGNOSIS — R0902 Hypoxemia: Secondary | ICD-10-CM | POA: Diagnosis not present

## 2016-05-20 ENCOUNTER — Encounter: Payer: Self-pay | Admitting: Pulmonary Disease

## 2016-05-20 ENCOUNTER — Ambulatory Visit (INDEPENDENT_AMBULATORY_CARE_PROVIDER_SITE_OTHER): Payer: Medicare Other | Admitting: Pulmonary Disease

## 2016-05-20 DIAGNOSIS — J441 Chronic obstructive pulmonary disease with (acute) exacerbation: Secondary | ICD-10-CM

## 2016-05-20 MED ORDER — PREDNISONE 20 MG PO TABS: 20.0000 mg | ORAL_TABLET | Freq: Every day | ORAL | 0 refills | Status: DC

## 2016-05-20 MED ORDER — LEVOFLOXACIN 500 MG PO TABS: 500.0000 mg | ORAL_TABLET | Freq: Every day | ORAL | 0 refills | Status: DC

## 2016-05-20 NOTE — Assessment & Plan Note (Signed)
She is having an acute exacerbation of her COPD despite a recent prescription for doxycycline. She has some mild wheezing on exam and continues to produce purulent mucus. Patients with her degree of COPD are at increased risk for chronic Pseudomonas colonization and cause of exacerbations.  Plan: Take the Levaquin '500mg'$  daily x5 days Take the prednisone 20 mg daily 5 days Let us know if your symptoms are not getting better We will see you back in 6 weeks with a nurse practitioner, I will see you 6 weeks after that

## 2016-05-20 NOTE — Patient Instructions (Signed)
Take the Levaquin '500mg'$  daily x5 days Take the prednisone 20 mg daily 5 days Let us know if your symptoms are not getting better Use Chlortrimazole cream for the rash on your nose We will see you back in 6 weeks with a nurse practitioner, I will see you 6 weeks after that

## 2016-05-20 NOTE — Progress Notes (Signed)
Subjective:    [name], female    DOB: 1940-08-18, 75 y.o.   MRN: [id number]  Synopsis: [name] [name] has gold stage IV COPD and has followed with Dr. Halford [name] in [place] since around 2007. She uses 4 L of oxygen with exertion and 3 L at rest. She participates in pulmonary rehabilitation at [facility]. Because she lives in [place] she switched her care to Dr. [place] [name] in may of 2014. She had a HRCT in 2016 that showed emphysema but no ILD.  HPI  Chief Complaint  Patient presents with  . Follow-up    pt c/o increased sinus and chest congestion, prod cough with green mucus, fatigue X2 weeks.     [name] [name] had to take doxycyline recently due to increased chest congestion, cough, mucus production. She says she has been experiecing sinus congestion in addition to the chest symptoms as well.  She has some subjective fevers and chills.  She is producing thick green mucus.  She finished the doxycycline over a week ago.  She notes a lot of sick contacts.  She has turned her oxygen up to 5Lpm.  She is using 3 LPM now.    She is using albuterol frequently, 2-3 times per day.    She has not taken prednisone since the spring.    Past Medical History:  Diagnosis Date  . Chronic cystitis    recurrent UTI (4+ in last year), on suppressive doxy (uro Cope)  . Colon polyp   . 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 Depression with anxiety   . Diverticular disease 10/09/09   CT abd no sigmoid mass DDD L5/S1  . Dyslipidemia   . GERD (gastroesophageal reflux disease)   . Hematuria    h/o kidney stones  . Hiatal hernia   . Hypertension   . Left scapholunate ligament tear 2014   found on imaging  . Lung cancer ([place]) 1987   Partial RLL Lobectomy.  . Osteoarthritis   . Osteopenia 08/2011   DEXA: L femur -1.6  . Peptic ulcer   . Spinal abscess ([place]) 2000   secondary to periodontal disease        Review of Systems  Constitutional: Negative for chills, fever and  unexpected weight change.  HENT: Negative for congestion, dental problem, ear pain, nosebleeds, postnasal drip, rhinorrhea, sinus pressure, sneezing, sore throat, trouble swallowing and voice change.   Respiratory: Positive for shortness of breath. Negative for cough and choking.   Cardiovascular: Negative for chest pain and leg swelling.       Objective:   Physical Exam Vitals:   05/20/16 1402  BP: 136/64  BP Location: Left Arm  Cuff Size: Normal  Pulse: 71  SpO2: 96%  Weight: 68.9 kg (152 lb)  Height: '5\' 5"'$  (1.651 m)  On 4 L [place]  Gen: well appearing, no acute distress HEENT: NCAT,  EOMi, OP clear,  PULM: Mild wheezing, good air movement CV: RRR, no mgr, no JVD AB: BS+, soft, nontender, no hsm Ext: warm, no edema, no clubbing, no cyanosis      Assessment & Plan:   COPD exacerbation ([place]) She is having an acute exacerbation of her COPD despite a recent prescription for doxycycline. She has some mild wheezing on exam and continues to produce purulent mucus. Patients with her degree of COPD are at increased risk for chronic Pseudomonas colonization and cause of exacerbations.  Plan: Take the Levaquin '500mg'$  daily x5 days Take the prednisone  20 mg daily 5 days Let us know if your symptoms are not getting better We will see you back in 6 weeks with a nurse practitioner, I will see you 6 weeks after that   Updated Medication List Outpatient Encounter Prescriptions as of 05/20/2016  Medication Sig Dispense Refill  . albuterol (PROAIR HFA) 108 (90 BASE) MCG/ACT inhaler Inhale 2 puffs into the lungs every 4 (four) hours as needed. 1 Inhaler 5  . albuterol (PROVENTIL) (2.5 MG/3ML) 0.083% nebulizer solution Take every 4-6 hours as needed for shortness of breath 75 mL 5  . arformoterol (BROVANA) 15 MCG/2ML NEBU Take 2 mLs (15 mcg total) by nebulization 2 (two) times daily. 120 mL 11  . aspirin EC 81 MG tablet Take 1 tablet (81 mg total) by mouth every Monday, Wednesday, and Friday.     . benzonatate (TESSALON) 100 MG capsule Take 1 capsule (100 mg total) by mouth 3 (three) times daily as needed for cough. Swallow whole-do not bite pill 30 capsule 0  . budesonide (PULMICORT) 0.5 MG/2ML nebulizer solution Take 2 mLs (0.5 mg total) by nebulization 2 (two) times daily. 60 mL 12  . buPROPion (WELLBUTRIN SR) 150 MG 12 hr tablet TAKE ONE TABLET BY MOUTH TWICE DAILY 60 tablet 6  . conjugated estrogens (PREMARIN) vaginal cream Place 1 Applicatorful vaginally daily. Apply 0.'5mg'$  (pea-sized amount)  just inside the vaginal introitus with a finger-tip every night for two weeks and then Monday, Wednesday and Friday nights. 30 g 12  . CRANBERRY PO Take 2 tablets by mouth every morning.     Marland Kitchen estradiol (ESTRACE VAGINAL) 0.1 MG/GM vaginal cream Apply 0.'5mg'$  (pea-sized amount)  just inside the vaginal introitus with a finger-tip every night for two weeks and then Monday, Wednesday and Friday nights. 30 g 12  . guaifenesin (HUMIBID E) 400 MG TABS tablet Take 400 mg by mouth every 4 (four) hours.    Marland Kitchen losartan (COZAAR) 50 MG tablet TAKE ONE TABLET BY MOUTH EVERY DAY 30 tablet 11  . metoprolol tartrate (LOPRESSOR) 25 MG tablet TAKE ONE TABLET BY MOUTH TWICE DAILY 60 tablet 11  . mirabegron ER (MYRBETRIQ) 50 MG TB24 tablet Take 1 tablet (50 mg total) by mouth daily. 30 tablet 12  . omeprazole (PRILOSEC) 40 MG capsule TAKE ONE CAPSULE BY MOUTH EVERY DAY 30 capsule 11  . pravastatin (PRAVACHOL) 80 MG tablet ONE TABLET BY MOUTH AT BEDTIME 90 tablet 1  . psyllium (METAMUCIL) 58.6 % powder Take 1 packet by mouth at bedtime.     . sertraline (ZOLOFT) 100 MG tablet Take one tablet in the morning and one-half tablet at bedtime 45 tablet 3  . SPIRIVA HANDIHALER 18 MCG inhalation capsule INHALE CONTENTS OF 1 CAPSULE ONCE DAILY. 30 capsule 5  . vitamin B-12 (CYANOCOBALAMIN) 1000 MCG tablet Take 1,000 mcg by mouth daily.    Marland Kitchen levofloxacin (LEVAQUIN) 500 MG tablet Take 1 tablet (500 mg total) by mouth daily. 5  tablet 0  . predniSONE (DELTASONE) 20 MG tablet Take 1 tablet (20 mg total) by mouth daily with breakfast. 5 tablet 0  . [DISCONTINUED] doxycycline (VIBRA-TABS) 100 MG tablet Take 1 tablet (100 mg total) by mouth 2 (two) times daily. Take with non dairy food (Patient not taking: Reported on 05/20/2016) 20 tablet 0  . [DISCONTINUED] predniSONE (DELTASONE) 10 MG tablet Take 3 pills once daily by mouth for 3 days, then 2 pills once daily for 3 days, then 1 pill once daily for 3 days and then  stop (Patient not taking: Reported on 05/20/2016) 18 tablet 0   No facility-administered encounter medications on file as of 05/20/2016.

## 2016-05-26 ENCOUNTER — Other Ambulatory Visit: Payer: Self-pay | Admitting: Family Medicine

## 2016-06-03 ENCOUNTER — Ambulatory Visit (INDEPENDENT_AMBULATORY_CARE_PROVIDER_SITE_OTHER)
Admission: RE | Admit: 2016-06-03 | Discharge: 2016-06-03 | Disposition: A | Discharge status: Home / Self Care | Payer: PPO | Source: Ambulatory Visit | Attending: Family Medicine | Admitting: Family Medicine

## 2016-06-03 ENCOUNTER — Ambulatory Visit (INDEPENDENT_AMBULATORY_CARE_PROVIDER_SITE_OTHER): Payer: PPO | Admitting: Family Medicine

## 2016-06-03 ENCOUNTER — Encounter: Payer: Self-pay | Admitting: Family Medicine

## 2016-06-03 VITALS — BP 136/76 | HR 67 | Temp 98.3°F | Wt 149.5 lb

## 2016-06-03 DIAGNOSIS — M545 Low back pain, unspecified: Secondary | ICD-10-CM

## 2016-06-03 MED ORDER — METHOCARBAMOL 500 MG PO TABS: 250.0000 mg | ORAL_TABLET | Freq: Two times a day (BID) | ORAL | 0 refills | Status: DC | PRN

## 2016-06-03 NOTE — Patient Instructions (Signed)
I do think you have lumbar strain with muscle spasms - may try low dose muscle relaxant but careful as it can make you more unsteady - start at 1/2 tablet at a time. Heating pad to back (never directly on the skin)  Gentle stretches to keep spine limber.  Xrays of lumbar spine today to ensure no fractures.  Let us know if no better with this.

## 2016-06-03 NOTE — Assessment & Plan Note (Addendum)
Anticipate lumbar strain however given comorbidities and midline point tenderness, will check lumbar films to r/o compression fracture. rec heating pad, gentle stretching, staying active, and 1/2 dose robaxin PRN. Continue tylenol.

## 2016-06-03 NOTE — Progress Notes (Addendum)
BP 136/76   Pulse 67   Temp 98.3 F (36.8 C) (Oral)   Wt 149 lb 8 oz (67.8 kg)   SpO2 97%   BMI 24.88 kg/m    CC: LBP Subjective:    [name], female    DOB: Sep 29, 1940, 76 y.o.   MRN: [id number]  HPI: [name] [name] is a 76 y.o. female presenting on 06/03/2016 for Back Pain (LBP x 5 days; feels like muscle spasms)   5d h/o midline lower back spasms with any position. Denies inciting trauma/injury or falls. No heavy lifting recently. Gabapentin '100mg'$  BID does help. No urinary symptoms.   No fevers/chills, shooting pain down legs, numbness or weakness down legs, or new bowel/bladder incontinence.   Relevant past medical, surgical, family and social history reviewed and updated as indicated. Interim medical history since our last visit reviewed. Allergies and medications reviewed and updated. Current Outpatient Prescriptions on File Prior to Visit  Medication Sig  . albuterol (PROAIR HFA) 108 (90 BASE) MCG/ACT inhaler Inhale 2 puffs into the lungs every 4 (four) hours as needed.  Marland Kitchen albuterol (PROVENTIL) (2.5 MG/3ML) 0.083% nebulizer solution Take every 4-6 hours as needed for shortness of breath  . arformoterol (BROVANA) 15 MCG/2ML NEBU Take 2 mLs (15 mcg total) by nebulization 2 (two) times daily.  Marland Kitchen aspirin EC 81 MG tablet Take 1 tablet (81 mg total) by mouth every Monday, Wednesday, and Friday.  . benzonatate (TESSALON) 100 MG capsule Take 1 capsule (100 mg total) by mouth 3 (three) times daily as needed for cough. Swallow whole-do not bite pill  . budesonide (PULMICORT) 0.5 MG/2ML nebulizer solution Take 2 mLs (0.5 mg total) by nebulization 2 (two) times daily.  Marland Kitchen buPROPion (WELLBUTRIN SR) 150 MG 12 hr tablet TAKE ONE TABLET BY MOUTH TWICE DAILY  . conjugated estrogens (PREMARIN) vaginal cream Place 1 Applicatorful vaginally daily. Apply 0.'5mg'$  (pea-sized amount)  just inside the vaginal introitus with a finger-tip every night for two weeks and then Monday,  Wednesday and Friday nights.  . CRANBERRY PO Take 2 tablets by mouth every morning.   Marland Kitchen estradiol (ESTRACE VAGINAL) 0.1 MG/GM vaginal cream Apply 0.'5mg'$  (pea-sized amount)  just inside the vaginal introitus with a finger-tip every night for two weeks and then Monday, Wednesday and Friday nights.  Marland Kitchen guaifenesin (HUMIBID E) 400 MG TABS tablet Take 400 mg by mouth every 4 (four) hours.  Marland Kitchen losartan (COZAAR) 50 MG tablet TAKE ONE TABLET BY MOUTH EVERY DAY  . metoprolol tartrate (LOPRESSOR) 25 MG tablet TAKE ONE TABLET BY MOUTH TWICE DAILY  . mirabegron ER (MYRBETRIQ) 50 MG TB24 tablet Take 1 tablet (50 mg total) by mouth daily.  Marland Kitchen omeprazole (PRILOSEC) 40 MG capsule TAKE ONE CAPSULE BY MOUTH EVERY DAY  . pravastatin (PRAVACHOL) 80 MG tablet ONE TABLET BY MOUTH AT BEDTIME  . psyllium (METAMUCIL) 58.6 % powder Take 1 packet by mouth at bedtime.   . sertraline (ZOLOFT) 100 MG tablet Take one tablet in the morning and one-half tablet at bedtime  . SPIRIVA HANDIHALER 18 MCG inhalation capsule INHALE CONTENTS OF 1 CAPSULE ONCE DAILY.  . vitamin B-12 (CYANOCOBALAMIN) 1000 MCG tablet Take 1,000 mcg by mouth daily.   No current facility-administered medications on file prior to visit.     Review of Systems Per HPI unless specifically indicated in ROS section     Objective:    BP 136/76   Pulse 67   Temp 98.3 F (36.8 C) (Oral)  Wt 149 lb 8 oz (67.8 kg)   SpO2 97%   BMI 24.88 kg/m   Wt Readings from Last 3 Encounters:  06/03/16 149 lb 8 oz (67.8 kg)  05/20/16 152 lb (68.9 kg)  05/11/16 155 lb 8 oz (70.5 kg)    Physical Exam  Constitutional: She is oriented to person, place, and time. She appears well-developed and well-nourished. No distress.  Musculoskeletal: She exhibits no edema.  Discomfort to palpation midline mid and lower lumbar spine  + lumbar paraspinous mm tenderness and tightness R > L Neg SLR bilaterally. No pain with int/ext rotation at hip.  Neurological: She is alert and  oriented to person, place, and time.  5/5 strength BLE  Skin: Skin is warm and dry. No rash noted.  Nursing note and vitals reviewed.     Assessment & Plan:   Problem List Items Addressed This Visit    Acute midline low back pain without sciatica - Primary    Anticipate lumbar strain however given comorbidities and midline point tenderness, will check lumbar films to r/o compression fracture. rec heating pad, gentle stretching, staying active, and 1/2 dose robaxin PRN. Continue tylenol.       Relevant Medications   methocarbamol (ROBAXIN) 500 MG tablet   Other Relevant Orders   DG Lumbar Spine Complete       Follow up plan: Return if symptoms worsen or fail to improve.  Ria Bush, MD

## 2016-06-03 NOTE — Progress Notes (Signed)
Pre visit review using our clinic review tool, if applicable. No additional management support is needed unless otherwise documented below in the visit note. 

## 2016-06-15 ENCOUNTER — Other Ambulatory Visit: Payer: Self-pay | Admitting: Family Medicine

## 2016-06-15 DIAGNOSIS — R0902 Hypoxemia: Secondary | ICD-10-CM | POA: Diagnosis not present

## 2016-07-01 ENCOUNTER — Ambulatory Visit (INDEPENDENT_AMBULATORY_CARE_PROVIDER_SITE_OTHER): Payer: PPO | Admitting: Adult Health

## 2016-07-01 ENCOUNTER — Encounter: Payer: Self-pay | Admitting: Adult Health

## 2016-07-01 ENCOUNTER — Ambulatory Visit (INDEPENDENT_AMBULATORY_CARE_PROVIDER_SITE_OTHER)
Admission: RE | Admit: 2016-07-01 | Discharge: 2016-07-01 | Disposition: A | Discharge status: Home / Self Care | Payer: PPO | Source: Ambulatory Visit | Attending: Adult Health | Admitting: Adult Health

## 2016-07-01 DIAGNOSIS — J439 Emphysema, unspecified: Secondary | ICD-10-CM | POA: Diagnosis not present

## 2016-07-01 DIAGNOSIS — R05 Cough: Secondary | ICD-10-CM | POA: Diagnosis not present

## 2016-07-01 NOTE — Progress Notes (Signed)
$'@Patient'L$  ID: [name] [name], female    DOB: 1941-01-12, 76 y.o.   MRN: [id number]  Chief Complaint  Patient presents with  . Follow-up    COPD     Referring provider: Ria Bush, MD  HPI: 76 year old female followed for Gold stage IV COPD and oxygen dependent respiratory failure on 4 L of oxygen with activity and 3 L at rest  TEST  HRCT 2016 emphysema no ILD  07/01/2016 follow-up: COPD /O2 RF  Pt presents for a 6 week follow up . She was seen last visit for COPD exacerbation. She was treated with antibiotics and steroids. Patient says he is feeling better. Symptoms improved quite a bit. However, she does have a lingering cough and congestion. It happens almost days. Occasionally she does see some yellow to green mucus. He denies any fever, chest pain, hemoptysis, orthopnea, PND.  Appetite  is good with no nausea, vomiting or diarrhea She remains on Brovana and Pulmicort nebulizer twice daily along with Spiriva daily.    Allergies  Allergen Reactions  . Keflex [Cephalexin] Shortness Of Breath  . Penicillins     REACTION: hives    Immunization History  Administered Date(s) Administered  . Influenza Split 03/27/2012  . Influenza Whole 02/21/2006, 02/21/2008, 02/22/2011  . Influenza,inj,Quad PF,36+ Mos 02/16/2013, 02/18/2014, 03/04/2015, 01/21/2016  . Pneumococcal Conjugate-13 02/18/2014  . Pneumococcal Polysaccharide-23 02/10/2009  . Td 05/25/1998, 09/18/2008  . Zoster 11/13/2012    Past Medical History:  Diagnosis Date  . Chronic cystitis    recurrent UTI (4+ in last year), on suppressive doxy (uro Cope)  . Colon polyp   . 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 Depression with anxiety   . Diverticular disease 10/09/09   CT abd no sigmoid mass DDD L5/S1  . Dyslipidemia   . GERD (gastroesophageal reflux disease)   . Hematuria    h/o kidney stones  . Hiatal hernia   . Hypertension   . Left scapholunate ligament tear 2014   found on imaging  . Lung cancer ([place])  1987   Partial RLL Lobectomy.  . Osteoarthritis   . Osteopenia 08/2011   DEXA: L femur -1.6  . Peptic ulcer   . Spinal abscess ([place]) 2000   secondary to periodontal disease    Tobacco History: History  Smoking Status  . Former Smoker  . Packs/day: 3.00  . Years: 34.00  . Types: Cigarettes  . Quit date: 05/29/1995  Smokeless Tobacco  . Never Used   Counseling given: Not Answered   Outpatient Encounter Prescriptions as of 07/01/2016  Medication Sig  . albuterol (PROAIR HFA) 108 (90 BASE) MCG/ACT inhaler Inhale 2 puffs into the lungs every 4 (four) hours as needed.  Marland Kitchen albuterol (PROVENTIL) (2.5 MG/3ML) 0.083% nebulizer solution Take every 4-6 hours as needed for shortness of breath  . arformoterol (BROVANA) 15 MCG/2ML NEBU Take 2 mLs (15 mcg total) by nebulization 2 (two) times daily.  Marland Kitchen aspirin EC 81 MG tablet Take 1 tablet (81 mg total) by mouth every Monday, Wednesday, and Friday.  . benzonatate (TESSALON) 100 MG capsule Take 1 capsule (100 mg total) by mouth 3 (three) times daily as needed for cough. Swallow whole-do not bite pill  . budesonide (PULMICORT) 0.5 MG/2ML nebulizer solution Take 2 mLs (0.5 mg total) by nebulization 2 (two) times daily.  Marland Kitchen buPROPion (WELLBUTRIN SR) 150 MG 12 hr tablet TAKE ONE TABLET BY MOUTH TWICE DAILY  . conjugated estrogens (PREMARIN) vaginal cream Place 1 Applicatorful vaginally  daily. Apply 0.'5mg'$  (pea-sized amount)  just inside the vaginal introitus with a finger-tip every night for two weeks and then Monday, Wednesday and Friday nights.  . CRANBERRY PO Take 2 tablets by mouth every morning.   Marland Kitchen estradiol (ESTRACE VAGINAL) 0.1 MG/GM vaginal cream Apply 0.'5mg'$  (pea-sized amount)  just inside the vaginal introitus with a finger-tip every night for two weeks and then Monday, Wednesday and Friday nights.  . ezetimibe (ZETIA) 10 MG tablet Take 10 mg by mouth daily.  Marland Kitchen guaifenesin (HUMIBID E) 400 MG TABS tablet Take 400 mg by mouth every 4 (four) hours.  Marland Kitchen  losartan (COZAAR) 50 MG tablet TAKE ONE TABLET BY MOUTH EVERY DAY  . methocarbamol (ROBAXIN) 500 MG tablet Take 0.5-1 tablets (250-500 mg total) by mouth 2 (two) times daily as needed for muscle spasms (sedation precautions).  . metoprolol tartrate (LOPRESSOR) 25 MG tablet TAKE ONE TABLET BY MOUTH TWICE DAILY  . mirabegron ER (MYRBETRIQ) 50 MG TB24 tablet Take 1 tablet (50 mg total) by mouth daily.  Marland Kitchen omeprazole (PRILOSEC) 40 MG capsule TAKE ONE CAPSULE BY MOUTH EVERY DAY  . pravastatin (PRAVACHOL) 80 MG tablet TAKE ONE TABLET BY MOUTH AT BEDTIME  . psyllium (METAMUCIL) 58.6 % powder Take 1 packet by mouth at bedtime.   . sertraline (ZOLOFT) 100 MG tablet Take one tablet in the morning and one-half tablet at bedtime  . SPIRIVA HANDIHALER 18 MCG inhalation capsule INHALE CONTENTS OF 1 CAPSULE ONCE DAILY.  . vitamin B-12 (CYANOCOBALAMIN) 1000 MCG tablet Take 1,000 mcg by mouth daily.   No facility-administered encounter medications on file as of 07/01/2016.      Review of Systems  Constitutional:   No  weight loss, night sweats,  Fevers, chills,  +fatigue, or  lassitude.  HEENT:   No headaches,  Difficulty swallowing,  Tooth/dental problems, or  Sore throat,                No sneezing, itching, ear ache, nasal congestion, post nasal drip,   CV:  No chest pain,  Orthopnea, PND, swelling in lower extremities, anasarca, dizziness, palpitations, syncope.   GI  No heartburn, indigestion, abdominal pain, nausea, vomiting, diarrhea, change in bowel habits, loss of appetite, bloody stools.   Resp:  .  No wheezing.  No chest wall deformity  Skin: no rash or lesions.  GU: no dysuria, change in color of urine, no urgency or frequency.  No flank pain, no hematuria   MS:  No joint pain or swelling.  No decreased range of motion.  No back pain.    Physical Exam  BP 118/74 (BP Location: Left Arm, Cuff Size: Normal)   Pulse 67   Ht '5\' 3"'$  (1.6 m)   Wt 154 lb 3.2 oz (69.9 kg)   SpO2 96%   BMI  27.32 kg/m   GEN: A/Ox3; pleasant , NAD, elderly on O2    HEENT:  [place]/AT,  EACs-clear, TMs-wnl, NOSE-clear, THROAT-clear, no lesions, no postnasal drip or exudate noted.   NECK:  Supple w/ fair ROM; no JVD; normal carotid impulses w/o bruits; no thyromegaly or nodules palpated; no lymphadenopathy.    RESP  Decreased BS in bases ,  w/o, wheezes/ rales/ or rhonchi. no accessory muscle use, no dullness to percussion  CARD:  RRR, no m/r/g, no peripheral edema, pulses intact, no cyanosis or clubbing.  GI:   Soft & nt; nml bowel sounds; no organomegaly or masses detected.   Musco: Warm bil, no deformities or joint  swelling noted.   Neuro: alert, no focal deficits noted.    Skin: Warm, no lesions or rashes  Psych:  No change in mood or affect. No depression or anxiety.  No memory loss.  Lab Results:  ProBNP No results found for: PROBNP  Imaging: Dg Lumbar Spine Complete  Result Date: 06/04/2016 CLINICAL DATA:  Acute low back pain. EXAM: LUMBAR SPINE - COMPLETE 4+ VIEW COMPARISON:  CT scan of Oct 09, 2009. FINDINGS: No fracture or spondylolisthesis is noted. Moderate degenerative disc disease is noted at L1-2 and L2-3 with anterior osteophyte formation. Mild degenerative disc disease is noted at L3-4 with anterior osteophyte formation. Atherosclerosis of abdominal aorta is noted. IMPRESSION: Multilevel degenerative disc disease. Aortic atherosclerosis. No acute abnormality seen in the lumbar spine. Electronically Signed   By: Marijo Conception, M.D.   On: 06/04/2016 08:16     Assessment & Plan:   GOLD 4 COPD with emphysema Recent flare now resolving  Check cxr today   Plan  Patient Instructions  Increase Mucinex Twice daily .  Chest xray today  Continue on Brovana [employer] Twice daily   Continue on Spiriva daily .  Follow up Dr. Lake Bells in 6 months and As needed   Please contact office for sooner follow up if symptoms do not improve or worsen or seek emergency care           Rexene Edison, NP 07/01/2016

## 2016-07-01 NOTE — Addendum Note (Signed)
Addended by: Parke Poisson E on: 07/01/2016 02:53 PM   Modules accepted: Orders

## 2016-07-01 NOTE — Assessment & Plan Note (Signed)
Recent flare now resolving  Check cxr today   Plan  Patient Instructions  Increase Mucinex Twice daily .  Chest xray today  Continue on Brovana [employer] Twice daily   Continue on Spiriva daily .  Follow up Dr. Lake Bells in 6 months and As needed   Please contact office for sooner follow up if symptoms do not improve or worsen or seek emergency care

## 2016-07-01 NOTE — Patient Instructions (Addendum)
Increase Mucinex Twice daily .  Chest xray today  Continue on Brovana [employer] Twice daily   Continue on Spiriva daily .  Follow up Dr. Lake Bells in 6 months and As needed   Please contact office for sooner follow up if symptoms do not improve or worsen or seek emergency care

## 2016-07-02 NOTE — Progress Notes (Signed)
Reviewed, agree 

## 2016-07-16 DIAGNOSIS — R0902 Hypoxemia: Secondary | ICD-10-CM | POA: Diagnosis not present

## 2016-08-10 ENCOUNTER — Other Ambulatory Visit: Payer: Self-pay | Admitting: Family Medicine

## 2016-08-11 ENCOUNTER — Other Ambulatory Visit: Payer: Self-pay | Admitting: Family Medicine

## 2016-08-11 DIAGNOSIS — E785 Hyperlipidemia, unspecified: Secondary | ICD-10-CM

## 2016-08-11 DIAGNOSIS — J9611 Chronic respiratory failure with hypoxia: Secondary | ICD-10-CM

## 2016-08-12 ENCOUNTER — Ambulatory Visit (INDEPENDENT_AMBULATORY_CARE_PROVIDER_SITE_OTHER): Payer: PPO

## 2016-08-12 VITALS — BP 120/70 | HR 65 | Temp 97.7°F | Ht 63.25 in | Wt 151.5 lb

## 2016-08-12 DIAGNOSIS — E785 Hyperlipidemia, unspecified: Secondary | ICD-10-CM | POA: Diagnosis not present

## 2016-08-12 DIAGNOSIS — R309 Painful micturition, unspecified: Secondary | ICD-10-CM

## 2016-08-12 DIAGNOSIS — Z0000 Encounter for general adult medical examination without abnormal findings: Secondary | ICD-10-CM | POA: Diagnosis not present

## 2016-08-12 DIAGNOSIS — R3915 Urgency of urination: Secondary | ICD-10-CM | POA: Diagnosis not present

## 2016-08-12 DIAGNOSIS — R8290 Unspecified abnormal findings in urine: Secondary | ICD-10-CM | POA: Diagnosis not present

## 2016-08-12 DIAGNOSIS — J9611 Chronic respiratory failure with hypoxia: Secondary | ICD-10-CM | POA: Diagnosis not present

## 2016-08-12 LAB — LIPID PANEL
CHOLESTEROL: 172 mg/dL (ref 0–200)
HDL: 75.7 mg/dL (ref >39.00)
LDL CALC: 71 mg/dL (ref 0–99)
NonHDL: 96.54
TRIGLYCERIDES: 126 mg/dL (ref 0.0–149.0)
Total CHOL/HDL Ratio: 2
VLDL: 25.2 mg/dL (ref 0.0–40.0)

## 2016-08-12 LAB — CBC WITH DIFFERENTIAL/PLATELET
Basophils Absolute: 0 10*3/uL (ref 0.0–0.1)
Basophils Relative: 0.5 % (ref 0.0–3.0)
EOS ABS: 0.3 10*3/uL (ref 0.0–0.7)
EOS PCT: 3.5 % (ref 0.0–5.0)
HCT: 39.8 % (ref 36.0–46.0)
Hemoglobin: 13.1 g/dL (ref 12.0–15.0)
LYMPHS ABS: 2.3 10*3/uL (ref 0.7–4.0)
Lymphocytes Relative: 31.2 % (ref 12.0–46.0)
MCHC: 33 g/dL (ref 30.0–36.0)
MCV: 91.3 fl (ref 78.0–100.0)
MONO ABS: 0.7 10*3/uL (ref 0.1–1.0)
Monocytes Relative: 9.2 % (ref 3.0–12.0)
NEUTROS PCT: 55.6 % (ref 43.0–77.0)
Neutro Abs: 4.1 10*3/uL (ref 1.4–7.7)
Platelets: 161 10*3/uL (ref 150.0–400.0)
RBC: 4.35 Mil/uL (ref 3.87–5.11)
RDW: 13.6 % (ref 11.5–15.5)
WBC: 7.3 10*3/uL (ref 4.0–10.5)

## 2016-08-12 LAB — POC URINALSYSI DIPSTICK (AUTOMATED)
Bilirubin, UA: NEGATIVE
Blood, UA: NEGATIVE
Glucose, UA: NEGATIVE
KETONES UA: NEGATIVE
LEUKOCYTES UA: NEGATIVE
Nitrite, UA: POSITIVE
PH UA: 6 (ref 5.0–8.0)
PROTEIN UA: NEGATIVE
SPEC GRAV UA: 1.02 (ref 1.030–1.035)
UROBILINOGEN UA: 0.2 (ref ?–2.0)

## 2016-08-12 LAB — COMPREHENSIVE METABOLIC PANEL
ALBUMIN: 4.3 g/dL (ref 3.5–5.2)
ALT: 18 U/L (ref 0–35)
AST: 19 U/L (ref 0–37)
Alkaline Phosphatase: 37 U/L — ABNORMAL LOW (ref 39–117)
BILIRUBIN TOTAL: 0.4 mg/dL (ref 0.2–1.2)
BUN: 14 mg/dL (ref 6–23)
CHLORIDE: 100 meq/L (ref 96–112)
CO2: 35 mEq/L — ABNORMAL HIGH (ref 19–32)
CREATININE: 0.8 mg/dL (ref 0.40–1.20)
Calcium: 9.8 mg/dL (ref 8.4–10.5)
GFR: 74.24 mL/min (ref >60.00)
Glucose, Bld: 96 mg/dL (ref 70–99)
Potassium: 4.6 mEq/L (ref 3.5–5.1)
Sodium: 140 mEq/L (ref 135–145)
TOTAL PROTEIN: 6.7 g/dL (ref 6.0–8.3)

## 2016-08-12 LAB — TSH: TSH: 1.41 u[IU]/mL (ref 0.35–4.50)

## 2016-08-12 NOTE — Progress Notes (Signed)
Pre visit review using our clinic review tool, if applicable. No additional management support is needed unless otherwise documented below in the visit note. 

## 2016-08-12 NOTE — Patient Instructions (Signed)
Ms. Dettmer , Thank you for taking time to come for your Medicare Wellness Visit. I appreciate your ongoing commitment to your health goals. Please review the following plan we discussed and let me know if I can assist you in the future.   These are the goals we discussed: Goals    . Increase physical activity          When weather permits, I will resume stretching and walking for 30 min 1-2 days per week.        This is a list of the screening recommended for you and due dates:  Health Maintenance  Topic Date Due  . DTaP/Tdap/Td vaccine (1 - Tdap) 09/19/2018*  . Tetanus Vaccine  09/19/2018  . Colon Cancer Screening  04/30/2024  . Flu Shot  Completed  . DEXA scan (bone density measurement)  Completed  . Pneumonia vaccines  Completed  *Topic was postponed. The date shown is not the original due date.   Preventive Care for Adults  A healthy lifestyle and preventive care can promote health and wellness. Preventive health guidelines for adults include the following key practices.  . A routine yearly physical is a good way to check with your health care provider about your health and preventive screening. It is a chance to share any concerns and updates on your health and to receive a thorough exam.  . Visit your dentist for a routine exam and preventive care every 6 months. Brush your teeth twice a day and floss once a day. Good oral hygiene prevents tooth decay and gum disease.  . The frequency of eye exams is based on your age, health, family medical history, use  of contact lenses, and other factors. Follow your health care provider's ecommendations for frequency of eye exams.  . Eat a healthy diet. Foods like vegetables, fruits, whole grains, low-fat dairy products, and lean protein foods contain the nutrients you need without too many calories. Decrease your intake of foods high in solid fats, added sugars, and salt. Eat the right amount of calories for you. Get information  about a proper diet from your health care provider, if necessary.  . Regular physical exercise is one of the most important things you can do for your health. Most adults should get at least 150 minutes of moderate-intensity exercise (any activity that increases your heart rate and causes you to sweat) each week. In addition, most adults need muscle-strengthening exercises on 2 or more days a week.  Silver Sneakers may be a benefit available to you. To determine eligibility, you may visit the website: www.silversneakers.com or contact program at 5143711179 Mon-Fri between 8AM-8PM.   . Maintain a healthy weight. The body mass index (BMI) is a screening tool to identify possible weight problems. It provides an estimate of body fat based on height and weight. Your health care provider can find your BMI and can help you achieve or maintain a healthy weight.   For adults 20 years and older: ? A BMI below 18.5 is considered underweight. ? A BMI of 18.5 to 24.9 is normal. ? A BMI of 25 to 29.9 is considered overweight. ? A BMI of 30 and above is considered obese.   . Maintain normal blood lipids and cholesterol levels by exercising and minimizing your intake of saturated fat. Eat a balanced diet with plenty of fruit and vegetables. Blood tests for lipids and cholesterol should begin at age 76 and be repeated every 5 years. If your lipid or cholesterol  levels are high, you are over 50, or you are at high risk for heart disease, you may need your cholesterol levels checked more frequently. Ongoing high lipid and cholesterol levels should be treated with medicines if diet and exercise are not working.  . If you smoke, find out from your health care provider how to quit. If you do not use tobacco, please do not start.  . If you choose to drink alcohol, please do not consume more than 2 drinks per day. One drink is considered to be 12 ounces (355 mL) of beer, 5 ounces (148 mL) of wine, or 1.5 ounces (44  mL) of liquor.  . If you are 76 years old, ask your health care provider if you should take aspirin to prevent strokes.  . Use sunscreen. Apply sunscreen liberally and repeatedly throughout the day. You should seek shade when your shadow is shorter than you. Protect yourself by wearing long sleeves, pants, a wide-brimmed hat, and sunglasses year round, whenever you are outdoors.  . Once a month, do a whole body skin exam, using a mirror to look at the skin on your back. Tell your health care provider of new moles, moles that have irregular borders, moles that are larger than a pencil eraser, or moles that have changed in shape or color.

## 2016-08-12 NOTE — Progress Notes (Signed)
Subjective:   [name] [name] is a 76 y.o. female who presents for Medicare Annual (Subsequent) preventive examination.  Review of Systems:  N/A Cardiac Risk Factors include: advanced age (>25mn, >>97women);dyslipidemia;hypertension     Objective:     Vitals: BP 120/70 (BP Location: Right Arm, Patient Position: Sitting, Cuff Size: Normal)   Pulse 65   Temp 97.7 F (36.5 C) (Oral)   Ht 5' 3.25" (1.607 m) Comment: no shoes  Wt 151 lb 8 oz (68.7 kg)   SpO2 94%   BMI 26.63 kg/m   Body mass index is 26.63 kg/m.   Tobacco History  Smoking Status  . Former Smoker  . Packs/day: 3.00  . Years: 34.00  . Types: Cigarettes  . Quit date: 05/29/1995  Smokeless Tobacco  . Never Used     Counseling given: No   Past Medical History:  Diagnosis Date  . Chronic cystitis    recurrent UTI (4+ in last year), on suppressive doxy (uro Cope)  . Colon polyp   . 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 Depression with anxiety   . Diverticular disease 10/09/09   CT abd no sigmoid mass DDD L5/S1  . Dyslipidemia   . GERD (gastroesophageal reflux disease)   . Hematuria    h/o kidney stones  . Hiatal hernia   . Hypertension   . Left scapholunate ligament tear 2014   found on imaging  . [place]   Partial RLL Lobectomy.  . Osteoarthritis   . Osteopenia 08/2011   DEXA: L femur -1.6  . Peptic ulcer   . [place]   secondary to periodontal disease   Past Surgical History:  Procedure Laterality Date  . BLADDER SURGERY    . BREAST BIOPSY Left 03/99   neg  . CHOLECYSTECTOMY  02/10/09  . COLONOSCOPY WITH PROPOFOL N/A 04/30/2014   Procedure: COLONOSCOPY WITH PROPOFOL;  Surgeon: JIrene Shipper MD;  Location: [facility];  Service: Endoscopy;  Laterality: N/A;  . CYSTOSCOPY  02/11/07 and 09/25/08   Dr. CJacqlyn Larsen normal  . DEXA  08/2011   T femur -1.6, T spine -0.6  . MWeinerpneumonia sepsis due to gum surgery  . REPLACEMENT TOTAL KNEE   09/2009   Right  . TOTAL ABDOMINAL HYSTERECTOMY  07/18/01   benign reason, BSO, vag repair secondary prolapse  . TUBAL LIGATION     bilateral   Family History  Problem Relation Age of Onset  . Pancreatitis Mother   . Heart disease Father   . Hypertension Father   . Emphysema Father   . Arthritis Brother   . Hypertension Brother   . Cancer Maternal Aunt     ovarian cancer  . Kidney disease Neg Hx   . Prostate cancer Neg Hx   . Bladder Cancer Neg Hx    History  Sexual Activity  . Sexual activity: Not on file    Outpatient Encounter Prescriptions as of 08/12/2016  Medication Sig  . albuterol (PROAIR HFA) 108 (90 BASE) MCG/ACT inhaler Inhale 2 puffs into the lungs every 4 (four) hours as needed.  .Marland Kitchenalbuterol (PROVENTIL) (2.5 MG/3ML) 0.083% nebulizer solution Take every 4-6 hours as needed for shortness of breath  . arformoterol (BROVANA) 15 MCG/2ML NEBU Take 2 mLs (15 mcg total) by nebulization 2 (two) times daily.  .Marland Kitchenaspirin EC 81 MG tablet Take 1 tablet (81 mg total) by mouth every Monday, Wednesday,  and Friday.  . benzonatate (TESSALON) 100 MG capsule Take 1 capsule (100 mg total) by mouth 3 (three) times daily as needed for cough. Swallow whole-do not bite pill  . budesonide (PULMICORT) 0.5 MG/2ML nebulizer solution Take 2 mLs (0.5 mg total) by nebulization 2 (two) times daily.  Marland Kitchen buPROPion (WELLBUTRIN SR) 150 MG 12 hr tablet TAKE ONE TABLET BY MOUTH TWICE DAILY  . conjugated estrogens (PREMARIN) vaginal cream Place 1 Applicatorful vaginally daily. Apply 0.'5mg'$  (pea-sized amount)  just inside the vaginal introitus with a finger-tip every night for two weeks and then Monday, Wednesday and Friday nights.  . CRANBERRY PO Take 2 tablets by mouth every morning.   Marland Kitchen estradiol (ESTRACE VAGINAL) 0.1 MG/GM vaginal cream Apply 0.'5mg'$  (pea-sized amount)  just inside the vaginal introitus with a finger-tip every night for two weeks and then Monday, Wednesday and Friday nights.  . ezetimibe  (ZETIA) 10 MG tablet TAKE ONE TABLET BY MOUTH EVERY DAY  . guaifenesin (HUMIBID E) 400 MG TABS tablet Take 400 mg by mouth every 4 (four) hours.  Marland Kitchen losartan (COZAAR) 50 MG tablet TAKE ONE TABLET BY MOUTH EVERY DAY  . methocarbamol (ROBAXIN) 500 MG tablet Take 0.5-1 tablets (250-500 mg total) by mouth 2 (two) times daily as needed for muscle spasms (sedation precautions).  . metoprolol tartrate (LOPRESSOR) 25 MG tablet TAKE ONE TABLET BY MOUTH TWICE DAILY  . mirabegron ER (MYRBETRIQ) 50 MG TB24 tablet Take 1 tablet (50 mg total) by mouth daily.  Marland Kitchen omeprazole (PRILOSEC) 40 MG capsule TAKE ONE CAPSULE BY MOUTH EVERY DAY  . pravastatin (PRAVACHOL) 80 MG tablet TAKE ONE TABLET BY MOUTH AT BEDTIME  . psyllium (METAMUCIL) 58.6 % powder Take 1 packet by mouth at bedtime.   . sertraline (ZOLOFT) 100 MG tablet Take one tablet in the morning and one-half tablet at bedtime  . SPIRIVA HANDIHALER 18 MCG inhalation capsule INHALE CONTENTS OF 1 CAPSULE ONCE DAILY.  . vitamin B-12 (CYANOCOBALAMIN) 1000 MCG tablet Take 1,000 mcg by mouth daily.   No facility-administered encounter medications on file as of 08/12/2016.     Activities of Daily Living In your present state of health, do you have any difficulty performing the following activities: 08/12/2016  Hearing? N  Vision? N  Difficulty concentrating or making decisions? N  Walking or climbing stairs? Y  Dressing or bathing? N  Doing errands, shopping? N  Preparing Food and eating ? N  Using the Toilet? N  In the past six months, have you accidently leaked urine? Y  Do you have problems with loss of bowel control? N  Managing your Medications? N  Managing your Finances? N  Housekeeping or managing your Housekeeping? N  Some recent data might be hidden    Patient Care Team: Ria Bush, MD as PCP - General (Family Medicine) Minna Merritts, MD as Consulting Physician (Cardiology)    Assessment:     Hearing Screening   '125Hz'$  '250Hz'$  '500Hz'$   '1000Hz'$  '2000Hz'$  '3000Hz'$  '4000Hz'$  '6000Hz'$  '8000Hz'$   Right ear:   40 40 40  40    Left ear:   40 40 40  40      Visual Acuity Screening   Right eye Left eye Both eyes  Without correction:     With correction: '20/30 20/25 20/25 '$    Exercise Activities and Dietary recommendations Current Exercise Habits: The patient does not participate in regular exercise at present, Exercise limited by: None identified  Goals    . Increase physical activity  When weather permits, I will resume stretching and walking for 30 min 1-2 days per week.       Fall Risk Fall Risk  08/12/2016 12/16/2015 03/04/2015 02/18/2014 11/13/2012  Falls in the past year? No No No No No   Depression Screen PHQ 2/9 Scores 08/12/2016 03/10/2016 12/16/2015 03/04/2015  PHQ - 2 Score 0 0 3 1  PHQ- 9 Score - 2 6 -     Cognitive Function MMSE - Mini Mental State Exam 08/12/2016  Orientation to time 5  Orientation to Place 5  Registration 3  Attention/ Calculation 0  Recall 2  Recall-comments pt was unable to recall 1 of 3 words  Language- name 2 objects 0  Language- repeat 1  Language- follow 3 step command 3  Language- read & follow direction 0  Write a sentence 0  Copy design 0  Total score 19       PLEASE NOTE: A Mini-Cog screen was completed. Maximum score is 20. A value of 0 denotes this part of [place] MMSE was not completed or the patient failed this part of the Mini-Cog screening.   Mini-Cog Screening Orientation to Time - Max 5 pts Orientation to Place - Max 5 pts Registration - Max 3 pts Recall - Max 3 pts Language Repeat - Max 1 pts Language Follow 3 Step Command - Max 3 pts   Immunization History  Administered Date(s) Administered  . Influenza Split 03/27/2012  . Influenza Whole 02/21/2006, 02/21/2008, 02/22/2011  . Influenza,inj,Quad PF,36+ Mos 02/16/2013, 02/18/2014, 03/04/2015, 01/21/2016  . Pneumococcal Conjugate-13 02/18/2014  . Pneumococcal Polysaccharide-23 02/10/2009  . Td 05/25/1998,  09/18/2008  . Zoster 11/13/2012   Screening Tests Health Maintenance  Topic Date Due  . DTaP/Tdap/Td (1 - Tdap) 09/19/2018 (Originally 09/19/2008)  . TETANUS/TDAP  09/19/2018  . COLONOSCOPY  04/30/2024  . INFLUENZA VACCINE  Completed  . DEXA SCAN  Completed  . PNA vac Low Risk Adult  Completed      Plan:     I have personally reviewed and addressed the Medicare Annual Wellness questionnaire and have noted the following in the patient's chart:  A. Medical and social history B. Use of alcohol, tobacco or illicit drugs  C. Current medications and supplements D. Functional ability and status E.  Nutritional status F.  Physical activity G. Advance directives H. List of other physicians I.  Hospitalizations, surgeries, and ER visits in previous 12 months J.  [place] to include hearing, vision, cognitive, depression L. Referrals and appointments - none  In addition, I have reviewed and discussed with patient certain preventive protocols, quality metrics, and best practice recommendations. A written personalized care plan for preventive services as well as general preventive health recommendations were provided to patient.  See attached scanned questionnaire for additional information.   Signed,   Lindell Noe, MHA, BS, LPN Health Coach

## 2016-08-12 NOTE — Progress Notes (Signed)
PCP notes:   Health maintenance:  No gaps identified.   Abnormal screenings:   Mini-Cog score: 19/20  Patient concerns:   Patient has complaint about urinary urgency and painful urination. PCP notified. UA ordered. UA revealed urine is positive for nitrites. PCP notified. Urine culture ordered.  Nurse concerns:  None  Next PCP appt:   08/23/16 @ 1400

## 2016-08-13 DIAGNOSIS — R0902 Hypoxemia: Secondary | ICD-10-CM | POA: Diagnosis not present

## 2016-08-13 DIAGNOSIS — J309 Allergic rhinitis, unspecified: Secondary | ICD-10-CM | POA: Diagnosis not present

## 2016-08-15 ENCOUNTER — Other Ambulatory Visit: Payer: Self-pay | Admitting: Family Medicine

## 2016-08-15 DIAGNOSIS — N390 Urinary tract infection, site not specified: Secondary | ICD-10-CM

## 2016-08-15 LAB — URINE CULTURE

## 2016-08-15 MED ORDER — SULFAMETHOXAZOLE-TRIMETHOPRIM 800-160 MG PO TABS: 1.0000 | ORAL_TABLET | Freq: Two times a day (BID) | ORAL | 0 refills | Status: DC

## 2016-08-15 NOTE — Progress Notes (Addendum)
I reviewed health advisor's note, was available for consultation, and agree with documentation and plan.  Noted urinary complaints. UA ordered + nitrates - UCx ordered. See UCx results for plan.

## 2016-08-23 ENCOUNTER — Encounter: Payer: Self-pay | Admitting: Family Medicine

## 2016-08-23 ENCOUNTER — Ambulatory Visit (INDEPENDENT_AMBULATORY_CARE_PROVIDER_SITE_OTHER): Payer: PPO | Admitting: Family Medicine

## 2016-08-23 VITALS — BP 122/68 | HR 61 | Temp 98.1°F | Ht 63.25 in | Wt 152.5 lb

## 2016-08-23 DIAGNOSIS — N390 Urinary tract infection, site not specified: Secondary | ICD-10-CM

## 2016-08-23 DIAGNOSIS — J439 Emphysema, unspecified: Secondary | ICD-10-CM

## 2016-08-23 DIAGNOSIS — I2510 Atherosclerotic heart disease of native coronary artery without angina pectoris: Secondary | ICD-10-CM

## 2016-08-23 DIAGNOSIS — Z7189 Other specified counseling: Secondary | ICD-10-CM

## 2016-08-23 DIAGNOSIS — J9611 Chronic respiratory failure with hypoxia: Secondary | ICD-10-CM

## 2016-08-23 DIAGNOSIS — I10 Essential (primary) hypertension: Secondary | ICD-10-CM

## 2016-08-23 DIAGNOSIS — M8580 Other specified disorders of bone density and structure, unspecified site: Secondary | ICD-10-CM

## 2016-08-23 DIAGNOSIS — F331 Major depressive disorder, recurrent, moderate: Secondary | ICD-10-CM

## 2016-08-23 DIAGNOSIS — E785 Hyperlipidemia, unspecified: Secondary | ICD-10-CM | POA: Diagnosis not present

## 2016-08-23 DIAGNOSIS — S61419A Laceration without foreign body of unspecified hand, initial encounter: Secondary | ICD-10-CM | POA: Insufficient documentation

## 2016-08-23 DIAGNOSIS — Z0000 Encounter for general adult medical examination without abnormal findings: Secondary | ICD-10-CM | POA: Diagnosis not present

## 2016-08-23 NOTE — Assessment & Plan Note (Signed)
Reviewed dietary calcium recommendations and rec start vit D 1000 IU daily. Discussed regular weight bearing exercises. Update DEXA.

## 2016-08-23 NOTE — Progress Notes (Signed)
Pre visit review using our clinic review tool, if applicable. No additional management support is needed unless otherwise documented below in the visit note. 

## 2016-08-23 NOTE — Assessment & Plan Note (Signed)
sxs resolved after 5d bactrim course.

## 2016-08-23 NOTE — Assessment & Plan Note (Signed)
O2 dependent. Continue 3L.

## 2016-08-23 NOTE — Assessment & Plan Note (Signed)
Stable period. Continue wellbutrin.

## 2016-08-23 NOTE — Assessment & Plan Note (Signed)
Advanced directed - would want HCPOA to be husband, [name] [name]. Does not have set up - requests handout. 

## 2016-08-23 NOTE — Assessment & Plan Note (Signed)
Chronic, stable. Continue current regimen. 

## 2016-08-23 NOTE — Assessment & Plan Note (Addendum)
Wound dressed again today. No signs of secondary infection.

## 2016-08-23 NOTE — Progress Notes (Signed)
BP 122/68 (BP Location: Left Arm, Patient Position: Sitting, Cuff Size: Normal)   Pulse 61   Temp 98.1 F (36.7 C) (Oral)   Ht 5' 3.25" (1.607 m)   Wt 152 lb 8 oz (69.2 kg)   SpO2 94% Comment: 3LNC  BMI 26.80 kg/m    CC: CPE Subjective:    [name], female    DOB: 11-20-1940, 76 y.o.   MRN: [id number]  HPI: [name] [name] is a 76 y.o. female presenting on 08/23/2016 for Annual Exam   Saw Katha Cabal last week for medicare wellness visit. Note reviewed. Endorsed dysuria, UA concerning for UTI. UCx grew >100k E coli resistant to keflex. Treated with bactrim 5d course. Symptoms fully resolved.   Pulling clothes out of dryer 4d ago - suffered left hand skin tear. Cleaned with peroxide and abx ointment. Changing daily.   Preventative: COLONOSCOPY WITH PROPOFOL Date: 04/30/2014 Irene Shipper - severe diverticulosis with sigmoid stenosis, no polyps, f/u PRN Well woman - has stopped.  Mammogram - 02/2015 DEXA osteopenia - Date: 08/2011 T femur -1.6, T spine -0.6. Drinks 2 glasses a day.  Flu shot yearly Td 2010  Pneumonia 2010 , prevnar 2015 Shingles - 10/2012  Advanced directed - would want HCPOA to be husband, Karlton Lemon. Does not have set up - requests handout. Seat belt use discussed Sunscreen use discussed. No changing moles on skin. Ex smoker - quit 1997 Alcohol - rare  Caffeine: 4 cups coffee  Remarried for the third time, 2nd husband died of heart attack. Two stepsons  Activity: Walks some for exercise  Diet: good water, fruits/vegetables daily   Relevant past medical, surgical, family and social history reviewed and updated as indicated. Interim medical history since our last visit reviewed. Allergies and medications reviewed and updated. Outpatient Medications Prior to Visit  Medication Sig Dispense Refill  . albuterol (PROAIR HFA) 108 (90 BASE) MCG/ACT inhaler Inhale 2 puffs into the lungs every 4 (four) hours as needed. 1 Inhaler 5  . albuterol  (PROVENTIL) (2.5 MG/3ML) 0.083% nebulizer solution Take every 4-6 hours as needed for shortness of breath 75 mL 5  . arformoterol (BROVANA) 15 MCG/2ML NEBU Take 2 mLs (15 mcg total) by nebulization 2 (two) times daily. 120 mL 11  . aspirin EC 81 MG tablet Take 1 tablet (81 mg total) by mouth every Monday, Wednesday, and Friday.    . benzonatate (TESSALON) 100 MG capsule Take 1 capsule (100 mg total) by mouth 3 (three) times daily as needed for cough. Swallow whole-do not bite pill 30 capsule 0  . budesonide (PULMICORT) 0.5 MG/2ML nebulizer solution Take 2 mLs (0.5 mg total) by nebulization 2 (two) times daily. 60 mL 12  . buPROPion (WELLBUTRIN SR) 150 MG 12 hr tablet TAKE ONE TABLET BY MOUTH TWICE DAILY 60 tablet 6  . conjugated estrogens (PREMARIN) vaginal cream Place 1 Applicatorful vaginally daily. Apply 0.'5mg'$  (pea-sized amount)  just inside the vaginal introitus with a finger-tip every night for two weeks and then Monday, Wednesday and Friday nights. 30 g 12  . CRANBERRY PO Take 2 tablets by mouth every morning.     Marland Kitchen estradiol (ESTRACE VAGINAL) 0.1 MG/GM vaginal cream Apply 0.'5mg'$  (pea-sized amount)  just inside the vaginal introitus with a finger-tip every night for two weeks and then Monday, Wednesday and Friday nights. 30 g 12  . ezetimibe (ZETIA) 10 MG tablet TAKE ONE TABLET BY MOUTH EVERY DAY 30 tablet 3  . guaifenesin (HUMIBID E) 400  MG TABS tablet Take 400 mg by mouth every 4 (four) hours.    Marland Kitchen losartan (COZAAR) 50 MG tablet TAKE ONE TABLET BY MOUTH EVERY DAY 30 tablet 2  . methocarbamol (ROBAXIN) 500 MG tablet Take 0.5-1 tablets (250-500 mg total) by mouth 2 (two) times daily as needed for muscle spasms (sedation precautions). 30 tablet 0  . metoprolol tartrate (LOPRESSOR) 25 MG tablet TAKE ONE TABLET BY MOUTH TWICE DAILY 60 tablet 11  . mirabegron ER (MYRBETRIQ) 50 MG TB24 tablet Take 1 tablet (50 mg total) by mouth daily. 30 tablet 12  . omeprazole (PRILOSEC) 40 MG capsule TAKE ONE  CAPSULE BY MOUTH EVERY DAY 30 capsule 2  . pravastatin (PRAVACHOL) 80 MG tablet TAKE ONE TABLET BY MOUTH AT BEDTIME 90 tablet 1  . psyllium (METAMUCIL) 58.6 % powder Take 1 packet by mouth at bedtime.     . sertraline (ZOLOFT) 100 MG tablet Take one tablet in the morning and one-half tablet at bedtime 45 tablet 3  . SPIRIVA HANDIHALER 18 MCG inhalation capsule INHALE CONTENTS OF 1 CAPSULE ONCE DAILY. 30 capsule 5  . vitamin B-12 (CYANOCOBALAMIN) 1000 MCG tablet Take 1,000 mcg by mouth daily.    Marland Kitchen sulfamethoxazole-trimethoprim (BACTRIM DS,SEPTRA DS) 800-160 MG tablet Take 1 tablet by mouth 2 (two) times daily. 10 tablet 0   No facility-administered medications prior to visit.      Per HPI unless specifically indicated in ROS section below Review of Systems  Constitutional: Negative for activity change, appetite change, chills, fatigue, fever and unexpected weight change.  HENT: Negative for hearing loss.   Eyes: Negative for visual disturbance.  Respiratory: Positive for shortness of breath. Negative for cough, chest tightness and wheezing.   Cardiovascular: Negative for chest pain, palpitations and leg swelling.  Gastrointestinal: Negative for abdominal distention, abdominal pain, blood in stool, constipation, diarrhea, nausea and vomiting.  Genitourinary: Negative for difficulty urinating and hematuria.  Musculoskeletal: Negative for arthralgias, myalgias and neck pain.  Skin: Negative for rash.  Neurological: Negative for dizziness, seizures, syncope and headaches.  Hematological: Negative for adenopathy. Does not bruise/bleed easily.  Psychiatric/Behavioral: Negative for dysphoric mood. The patient is not nervous/anxious.        Objective:    BP 122/68 (BP Location: Left Arm, Patient Position: Sitting, Cuff Size: Normal)   Pulse 61   Temp 98.1 F (36.7 C) (Oral)   Ht 5' 3.25" (1.607 m)   Wt 152 lb 8 oz (69.2 kg)   SpO2 94% Comment: 3LNC  BMI 26.80 kg/m   Wt Readings from  Last 3 Encounters:  08/23/16 152 lb 8 oz (69.2 kg)  08/12/16 151 lb 8 oz (68.7 kg)  07/01/16 154 lb 3.2 oz (69.9 kg)    Physical Exam  Constitutional: She is oriented to person, place, and time. She appears well-developed and well-nourished. No distress.  HENT:  Head: Normocephalic and atraumatic.  Right Ear: Hearing, tympanic membrane, external ear and ear canal normal.  Left Ear: Hearing, tympanic membrane, external ear and ear canal normal.  Nose: Nose normal.  Mouth/Throat: Uvula is midline, oropharynx is clear and moist and mucous membranes are normal. No oropharyngeal exudate, posterior oropharyngeal edema or posterior oropharyngeal erythema.  Eyes: Conjunctivae and EOM are normal. Pupils are equal, round, and reactive to light. No scleral icterus.  Neck: Normal range of motion. Neck supple.  Cardiovascular: Normal rate, regular rhythm, normal heart sounds and intact distal pulses.   No murmur heard. Pulses:      Radial pulses  are 2+ on the right side, and 2+ on the left side.  Pulmonary/Chest: Effort normal and breath sounds normal. No respiratory distress. She has no wheezes. She has no rales.  Abdominal: Soft. Bowel sounds are normal. She exhibits no distension and no mass. There is no tenderness. There is no rebound and no guarding.  Musculoskeletal: Normal range of motion. She exhibits no edema.  Lymphadenopathy:    She has no cervical adenopathy.  Neurological: She is alert and oriented to person, place, and time.  CN grossly intact, station and gait intact  Skin: Skin is warm and dry. No rash noted.  L dorsal medial hand with skin tear without surrounding erythema  Psychiatric: She has a normal mood and affect. Her behavior is normal. Judgment and thought content normal.  Nursing note and vitals reviewed.  Results for orders placed or performed in visit on 08/12/16  Urine culture  Result Value Ref Range   Culture ESCHERICHIA COLI    Colony Count Greater than 100,000  CFU/mL    Organism ID, Bacteria ESCHERICHIA COLI       Susceptibility   Escherichia coli -  (no method available)    AMPICILLIN >=32 Resistant     AMOX/CLAVULANIC >=32 Resistant     AMPICILLIN/SULBACTAM >=32 Resistant     PIP/TAZO 8 Sensitive     IMIPENEM <=0.25 Sensitive     CEFAZOLIN >=64 Resistant     CEFTRIAXONE <=1 Sensitive     CEFTAZIDIME <=1 Sensitive     CEFEPIME <=1 Sensitive     GENTAMICIN <=1 Sensitive     TOBRAMYCIN <=1 Sensitive     CIPROFLOXACIN <=0.25 Sensitive     LEVOFLOXACIN <=0.12 Sensitive     NITROFURANTOIN <=16 Sensitive     TRIMETH/SULFA* <=20 Sensitive      * NR=NOT REPORTABLE,SEE COMMENTORAL therapy:A cefazolin MIC of <32 predicts susceptibility to the oral agents cefaclor,cefdinir,cefpodoxime,cefprozil,cefuroxime,cephalexin,and loracarbef when used for therapy of uncomplicated UTIs due to E.coli,K.pneumomiae,and P.mirabilis. PARENTERAL therapy: A cefazolinMIC of >8 indicates resistance to parenteralcefazolin. An alternate test method must beperformed to confirm susceptibility to parenteralcefazolin.  Lipid panel  Result Value Ref Range   Cholesterol 172 0 - 200 mg/dL   Triglycerides 126.0 0.0 - 149.0 mg/dL   HDL 75.70 >39.00 mg/dL   VLDL 25.2 0.0 - 40.0 mg/dL   LDL Cholesterol 71 0 - 99 mg/dL   Total CHOL/HDL Ratio 2    NonHDL 96.54   Comprehensive metabolic panel  Result Value Ref Range   Sodium 140 135 - 145 mEq/L   Potassium 4.6 3.5 - 5.1 mEq/L   Chloride 100 96 - 112 mEq/L   CO2 35 (H) 19 - 32 mEq/L   Glucose, Bld 96 70 - 99 mg/dL   BUN 14 6 - 23 mg/dL   Creatinine, Ser 0.80 0.40 - 1.20 mg/dL   Total Bilirubin 0.4 0.2 - 1.2 mg/dL   Alkaline Phosphatase 37 (L) 39 - 117 U/L   AST 19 0 - 37 U/L   ALT 18 0 - 35 U/L   Total Protein 6.7 6.0 - 8.3 g/dL   Albumin 4.3 3.5 - 5.2 g/dL   Calcium 9.8 8.4 - 10.5 mg/dL   GFR 74.24 >60.00 mL/min  TSH  Result Value Ref Range   TSH 1.41 0.35 - 4.50 uIU/mL  CBC with Differential/Platelet  Result Value Ref  Range   WBC 7.3 4.0 - 10.5 K/uL   RBC 4.35 3.87 - 5.11 Mil/uL   Hemoglobin 13.1 12.0 - 15.0 g/dL  HCT 39.8 36.0 - 46.0 %   MCV 91.3 78.0 - 100.0 fl   MCHC 33.0 30.0 - 36.0 g/dL   RDW 13.6 11.5 - 15.5 %   Platelets 161.0 150.0 - 400.0 K/uL   Neutrophils Relative % 55.6 43.0 - 77.0 %   Lymphocytes Relative 31.2 12.0 - 46.0 %   Monocytes Relative 9.2 3.0 - 12.0 %   Eosinophils Relative 3.5 0.0 - 5.0 %   Basophils Relative 0.5 0.0 - 3.0 %   Neutro Abs 4.1 1.4 - 7.7 K/uL   Lymphs Abs 2.3 0.7 - 4.0 K/uL   Monocytes Absolute 0.7 0.1 - 1.0 K/uL   Eosinophils Absolute 0.3 0.0 - 0.7 K/uL   Basophils Absolute 0.0 0.0 - 0.1 K/uL  POCT Urinalysis Dipstick (Automated)  Result Value Ref Range   Color, UA yellow    Clarity, UA clear    Glucose, UA Neg    Bilirubin, UA Neg    Ketones, UA Neg    Spec Grav, UA 1.020 1.030 - 1.035   Blood, UA Neg    pH, UA 6.0 5.0 - 8.0   Protein, UA Neg    Urobilinogen, UA 0.2 Negative - 2.0   Nitrite, UA Pos    Leukocytes, UA Negative Negative      Assessment & Plan:   Problem List Items Addressed This Visit    Advanced care planning/counseling discussion    Advanced directed - would want HCPOA to be husband, Karlton Lemon. Does not have set up - requests handout.      CAD (coronary artery disease)   Chronic respiratory failure with hypoxia (HCC)   Dyslipidemia    Chronic, stable. Continue current regimen of pravastatin and zetia.       Essential hypertension    Chronic, stable. Continue current regimen.       GOLD 4 COPD with emphysema    O2 dependent. Continue 3L.       Health care maintenance - Primary    Preventative protocols reviewed and updated unless pt declined. Discussed healthy diet and lifestyle.       MDD (major depressive disorder), recurrent episode, moderate (HCC)    Stable period. Continue wellbutrin.      Osteopenia    Reviewed dietary calcium recommendations and rec start vit D 1000 IU daily. Discussed regular weight  bearing exercises. Update DEXA.       Relevant Orders   DG Bone Density   Recurrent UTI    sxs resolved after 5d bactrim course.       Skin tear of hand without complication, initial encounter    Wound dressed again today. No signs of secondary infection.           Follow up plan: Return in about 6 months (around 02/22/2017) for follow up visit.  Ria Bush, MD

## 2016-08-23 NOTE — Assessment & Plan Note (Signed)
Chronic, stable. Continue current regimen of pravastatin and zetia.

## 2016-08-23 NOTE — Assessment & Plan Note (Signed)
Preventative protocols reviewed and updated unless pt declined. Discussed healthy diet and lifestyle.  

## 2016-08-23 NOTE — Patient Instructions (Addendum)
Schedule mammogram at your convenience.  We will schedule bone density scan at your convenience.  Start vitamin D 1000 units daily. Continue good calcium in the diet.  Work on [employer].   Health Maintenance, Female Adopting a healthy lifestyle and getting preventive care can go a long way to promote health and wellness. Talk with your health care provider about what schedule of regular examinations is right for you. This is a good chance for you to check in with your provider about disease prevention and staying healthy. In between checkups, there are plenty of things you can do on your own. Experts have done a lot of research about which lifestyle changes and preventive measures are most likely to keep you healthy. Ask your health care provider for more information. Weight and diet Eat a healthy diet  Be sure to include plenty of vegetables, fruits, low-fat dairy products, and lean protein.  Do not eat a lot of foods high in solid fats, added sugars, or salt.  Get regular exercise. This is one of the most important things you can do for your health.  Most adults should exercise for at least 150 minutes each week. The exercise should increase your heart rate and make you sweat (moderate-intensity exercise).  Most adults should also do strengthening exercises at least twice a week. This is in addition to the moderate-intensity exercise. Maintain a healthy weight  Body mass index (BMI) is a measurement that can be used to identify possible weight problems. It estimates body fat based on height and weight. Your health care provider can help determine your BMI and help you achieve or maintain a healthy weight.  For females 76 years of age and older:  A BMI below 18.5 is considered underweight.  A BMI of 18.5 to 24.9 is normal.  A BMI of 25 to 29.9 is considered overweight.  A BMI of 30 and above is considered obese. Watch levels of cholesterol and blood lipids  You should  start having your blood tested for lipids and cholesterol at 76 years of age, then have this test every 5 years.  You may need to have your cholesterol levels checked more often if:  Your lipid or cholesterol levels are high.  You are older than 76 years of age.  You are at high risk for heart disease. Cancer screening Lung Cancer  Lung cancer screening is recommended for adults 62-47 years old who are at high risk for lung cancer because of a history of smoking.  A yearly low-dose CT scan of the lungs is recommended for people who:  Currently smoke.  Have quit within the past 15 years.  Have at least a 30-pack-year history of smoking. A pack year is smoking an average of one pack of cigarettes a day for 1 year.  Yearly screening should continue until it has been 15 years since you quit.  Yearly screening should stop if you develop a health problem that would prevent you from having lung cancer treatment. Breast Cancer  Practice breast self-awareness. This means understanding how your breasts normally appear and feel.  It also means doing regular breast self-exams. Let your health care provider know about any changes, no matter how small.  If you are in your 20s or 30s, you should have a clinical breast exam (CBE) by a health care provider every 1-3 years as part of a regular health exam.  If you are 52 or older, have a CBE every year. Also consider having a  breast X-ray (mammogram) every year.  If you have a family history of breast cancer, talk to your health care provider about genetic screening.  If you are at high risk for breast cancer, talk to your health care provider about having an MRI and a mammogram every year.  Breast cancer gene (BRCA) assessment is recommended for women who have family members with BRCA-related cancers. BRCA-related cancers include:  Breast.  Ovarian.  Tubal.  Peritoneal cancers.  Results of the assessment will determine the need for  genetic counseling and BRCA1 and BRCA2 testing. Cervical Cancer  Your health care provider may recommend that you be screened regularly for cancer of the pelvic organs (ovaries, uterus, and vagina). This screening involves a pelvic examination, including checking for microscopic changes to the surface of your cervix (Pap test). You may be encouraged to have this screening done every 3 years, beginning at age 23.  For women ages 58-65, health care providers may recommend pelvic exams and Pap testing every 3 years, or they may recommend the Pap and pelvic exam, combined with testing for human papilloma virus (HPV), every 5 years. Some types of HPV increase your risk of cervical cancer. Testing for HPV may also be done on women of any age with unclear Pap test results.  Other health care providers may not recommend any screening for nonpregnant women who are considered low risk for pelvic cancer and who do not have symptoms. Ask your health care provider if a screening pelvic exam is right for you.  If you have had past treatment for cervical cancer or a condition that could lead to cancer, you need Pap tests and screening for cancer for at least 20 years after your treatment. If Pap tests have been discontinued, your risk factors (such as having a new sexual partner) need to be reassessed to determine if screening should resume. Some women have medical problems that increase the chance of getting cervical cancer. In these cases, your health care provider may recommend more frequent screening and Pap tests. Colorectal Cancer  This type of cancer can be detected and often prevented.  Routine colorectal cancer screening usually begins at 76 years of age and continues through 76 years of age.  Your health care provider may recommend screening at an earlier age if you have risk factors for colon cancer.  Your health care provider may also recommend using home test kits to check for hidden blood in the  stool.  A small camera at the end of a tube can be used to examine your colon directly (sigmoidoscopy or colonoscopy). This is done to check for the earliest forms of colorectal cancer.  Routine screening usually begins at age 39.  Direct examination of the colon should be repeated every 5-10 years through 76 years of age. However, you may need to be screened more often if early forms of precancerous polyps or small growths are found. Skin Cancer  Check your skin from head to toe regularly.  Tell your health care provider about any new moles or changes in moles, especially if there is a change in a mole's shape or color.  Also tell your health care provider if you have a mole that is larger than the size of a pencil eraser.  Always use sunscreen. Apply sunscreen liberally and repeatedly throughout the day.  Protect yourself by wearing long sleeves, pants, a wide-brimmed hat, and sunglasses whenever you are outside. Heart disease, diabetes, and high blood pressure  High blood pressure causes  heart disease and increases the risk of stroke. High blood pressure is more likely to develop in:  People who have blood pressure in the high end of the normal range (130-139/85-89 mm Hg).  People who are overweight or obese.  People who are African American.  If you are 63-19 years of age, have your blood pressure checked every 3-5 years. If you are 28 years of age or older, have your blood pressure checked every year. You should have your blood pressure measured twice-once when you are at a hospital or clinic, and once when you are not at a hospital or clinic. Record the average of the two measurements. To check your blood pressure when you are not at a hospital or clinic, you can use:  An automated blood pressure machine at a pharmacy.  A home blood pressure monitor.  If you are between 12 years and 2 years old, ask your health care provider if you should take aspirin to prevent  strokes.  Have regular diabetes screenings. This involves taking a blood sample to check your fasting blood sugar level.  If you are at a normal weight and have a low risk for diabetes, have this test once every three years after 76 years of age.  If you are overweight and have a high risk for diabetes, consider being tested at a younger age or more often. Preventing infection Hepatitis B  If you have a higher risk for hepatitis B, you should be screened for this virus. You are considered at high risk for hepatitis B if:  You were born in a country where hepatitis B is common. Ask your health care provider which countries are considered high risk.  Your parents were born in a high-risk country, and you have not been immunized against hepatitis B (hepatitis B vaccine).  You have HIV or AIDS.  You use needles to inject street drugs.  You live with someone who has hepatitis B.  You have had sex with someone who has hepatitis B.  You get hemodialysis treatment.  You take certain medicines for conditions, including cancer, organ transplantation, and autoimmune conditions. Hepatitis C  Blood testing is recommended for:  Everyone born from [birth year] through 1965.  Anyone with known risk factors for hepatitis C. Sexually transmitted infections (STIs)  You should be screened for sexually transmitted infections (STIs) including gonorrhea and chlamydia if:  You are sexually active and are younger than 76 years of age.  You are older than 76 years of age and your health care provider tells you that you are at risk for this type of infection.  Your sexual activity has changed since you were last screened and you are at an increased risk for chlamydia or gonorrhea. Ask your health care provider if you are at risk.  If you do not have HIV, but are at risk, it may be recommended that you take a prescription medicine daily to prevent HIV infection. This is called pre-exposure prophylaxis  (PrEP). You are considered at risk if:  You are sexually active and do not regularly use condoms or know the HIV status of your partner(s).  You take drugs by injection.  You are sexually active with a partner who has HIV. Talk with your health care provider about whether you are at high risk of being infected with HIV. If you choose to begin PrEP, you should first be tested for HIV. You should then be tested every 3 months for as long as you are taking  PrEP. Pregnancy  If you are premenopausal and you may become pregnant, ask your health care provider about preconception counseling.  If you may become pregnant, take 400 to 800 micrograms (mcg) of folic acid every day.  If you want to prevent pregnancy, talk to your health care provider about birth control (contraception). Osteoporosis and menopause  Osteoporosis is a disease in which the bones lose minerals and strength with aging. This can result in serious bone fractures. Your risk for osteoporosis can be identified using a bone density scan.  If you are 27 years of age or older, or if you are at risk for osteoporosis and fractures, ask your health care provider if you should be screened.  Ask your health care provider whether you should take a calcium or vitamin D supplement to lower your risk for osteoporosis.  Menopause may have certain physical symptoms and risks.  Hormone replacement therapy may reduce some of these symptoms and risks. Talk to your health care provider about whether hormone replacement therapy is right for you. Follow these instructions at home:  Schedule regular health, dental, and eye exams.  Stay current with your immunizations.  Do not use any tobacco products including cigarettes, chewing tobacco, or electronic cigarettes.  If you are pregnant, do not drink alcohol.  If you are breastfeeding, limit how much and how often you drink alcohol.  Limit alcohol intake to no more than 1 drink per day for  nonpregnant women. One drink equals 12 ounces of beer, 5 ounces of wine, or 1 ounces of hard liquor.  Do not use street drugs.  Do not share needles.  Ask your health care provider for help if you need support or information about quitting drugs.  Tell your health care provider if you often feel depressed.  Tell your health care provider if you have ever been abused or do not feel safe at home. This information is not intended to replace advice given to you by your health care provider. Make sure you discuss any questions you have with your health care provider. Document Released: 11/23/2010 Document Revised: 10/16/2015 Document Reviewed: 02/11/2015 Elsevier Interactive Patient Education  2017 [employer].

## 2016-08-24 ENCOUNTER — Other Ambulatory Visit: Payer: Self-pay | Admitting: Family Medicine

## 2016-08-26 ENCOUNTER — Other Ambulatory Visit: Payer: Self-pay | Admitting: Family Medicine

## 2016-08-26 DIAGNOSIS — Z1231 Encounter for screening mammogram for malignant neoplasm of breast: Secondary | ICD-10-CM

## 2016-09-13 DIAGNOSIS — J309 Allergic rhinitis, unspecified: Secondary | ICD-10-CM | POA: Diagnosis not present

## 2016-09-13 DIAGNOSIS — R0902 Hypoxemia: Secondary | ICD-10-CM | POA: Diagnosis not present

## 2016-09-14 ENCOUNTER — Other Ambulatory Visit: Payer: Self-pay | Admitting: Family Medicine

## 2016-09-20 ENCOUNTER — Telehealth: Payer: Self-pay | Admitting: Family Medicine

## 2016-09-20 NOTE — Telephone Encounter (Signed)
Patient Name: [name] [name]  DOB: 28-Sep-1940    Initial Comment Caller states she fell very hard on Friday. Her husband said she passed out. She is very bruised on her back head and neck and her butt.    Nurse Assessment  Nurse: Raphael Gibney, RN, Vanita Ingles Date/Time (Eastern Time): 09/20/2016 10:16:25 AM  Confirm and document reason for call. If symptomatic, describe symptoms. ---Caller states she fell on Friday. Does not remember falling. thinks she landed on her right side. Has bruises on the right side of her back, bottom and head.  Does the patient have any new or worsening symptoms? ---Yes  Will a triage be completed? ---Yes  Related visit to physician within the last 2 weeks? ---No  Does the PT have any chronic conditions? (i.e. diabetes, asthma, etc.) ---Yes  List chronic conditions. ---COPD  Is this a behavioral health or substance abuse call? ---No     Guidelines    Guideline Title Affirmed Question Affirmed Notes  Head Injury Scalp swelling, bruise or pain    Final Disposition User   See PCP When Office is Open (within 3 days) Raphael Gibney, RN, Vanita Ingles    Comments  triage outcome upgraded to see physician within 72 hrs as pt has bruise on bottom that is 3 inches by 4 inches.  appt scheduled for 09/21/2016 at 2:45 pm with Dr. Ria Bush   Referrals  REFERRED TO PCP OFFICE   Disagree/Comply: Comply

## 2016-09-20 NOTE — Telephone Encounter (Signed)
Pt has appt with Dr Darnell Level on 09/21/16 at 2:45.

## 2016-09-21 ENCOUNTER — Encounter: Payer: Self-pay | Admitting: Family Medicine

## 2016-09-21 ENCOUNTER — Ambulatory Visit (INDEPENDENT_AMBULATORY_CARE_PROVIDER_SITE_OTHER): Payer: PPO | Admitting: Family Medicine

## 2016-09-21 VITALS — BP 120/60 | HR 62 | Temp 98.0°F | Wt 153.0 lb

## 2016-09-21 DIAGNOSIS — J9611 Chronic respiratory failure with hypoxia: Secondary | ICD-10-CM | POA: Diagnosis not present

## 2016-09-21 DIAGNOSIS — S0003XA Contusion of scalp, initial encounter: Secondary | ICD-10-CM | POA: Diagnosis not present

## 2016-09-21 DIAGNOSIS — I951 Orthostatic hypotension: Secondary | ICD-10-CM | POA: Diagnosis not present

## 2016-09-21 DIAGNOSIS — J439 Emphysema, unspecified: Secondary | ICD-10-CM | POA: Diagnosis not present

## 2016-09-21 MED ORDER — TIOTROPIUM BROMIDE MONOHYDRATE 18 MCG IN CAPS: ORAL_CAPSULE | RESPIRATORY_TRACT | 6 refills | Status: DC

## 2016-09-21 NOTE — Progress Notes (Signed)
BP 120/60 (BP Location: Right Arm, Cuff Size: Large)   Pulse 62   Temp 98 F (36.7 C) (Oral)   Wt 153 lb (69.4 kg)   SpO2 97% Comment: 3 liters of oxygen  BMI 26.89 kg/m    CC: fall with dizziness Subjective:    Patient ID: KARLEIGH BUNTE, female    DOB: [birth year], 76 y.o.   MRN: [id number]  HPI: SHARDAE KLEINMAN is a 76 y.o. female presenting on 09/21/2016 for Fall (Friday afternoon, bruise on bottom, head and neck)   DOI: 09/17/2016 While walking out of beauty shop she had a fall. Thinks she passed out. Husband saw her.  She felt lightheaded but doesn't remember blacking out. She hit her head and R buttock on cement and has some bruising to posterior neck and right buttock. No residual headache, vision changes, slurred speech, numbness or weakness.   Denies seizure.  Trying to stay well hydrated.  Denies chest pain, tightness, dyspnea, coughing, no recent UTI sxs.  Ongoing urinary accidents.  No med changes. No diet changes.   h/o recurrent UTI - latest late 07/2016 treated with 5d bactrim course (>100k Ecoli).  She stopped baby aspirin  Relevant past medical, surgical, family and social history reviewed and updated as indicated. Interim medical history since our last visit reviewed. Allergies and medications reviewed and updated. Outpatient Medications Prior to Visit  Medication Sig Dispense Refill  . albuterol (PROAIR HFA) 108 (90 BASE) MCG/ACT inhaler Inhale 2 puffs into the lungs every 4 (four) hours as needed. 1 Inhaler 5  . albuterol (PROVENTIL) (2.5 MG/3ML) 0.083% nebulizer solution Take every 4-6 hours as needed for shortness of breath 75 mL 5  . arformoterol (BROVANA) 15 MCG/2ML NEBU Take 2 mLs (15 mcg total) by nebulization 2 (two) times daily. 120 mL 11  . benzonatate (TESSALON) 100 MG capsule Take 1 capsule (100 mg total) by mouth 3 (three) times daily as needed for cough. Swallow whole-do not bite pill 30 capsule 0  . budesonide (PULMICORT) 0.5 MG/2ML nebulizer  solution Take 2 mLs (0.5 mg total) by nebulization 2 (two) times daily. 60 mL 12  . buPROPion (WELLBUTRIN SR) 150 MG 12 hr tablet TAKE ONE TABLET BY MOUTH TWICE DAILY 60 tablet 6  . cholecalciferol (VITAMIN D) 1000 units tablet Take 2,000 Units by mouth daily.     Marland Kitchen conjugated estrogens (PREMARIN) vaginal cream Place 1 Applicatorful vaginally daily. Apply 0.'5mg'$  (pea-sized amount)  just inside the vaginal introitus with a finger-tip every night for two weeks and then Monday, Wednesday and Friday nights. 30 g 12  . CRANBERRY PO Take 2 tablets by mouth every morning.     Marland Kitchen estradiol (ESTRACE VAGINAL) 0.1 MG/GM vaginal cream Apply 0.'5mg'$  (pea-sized amount)  just inside the vaginal introitus with a finger-tip every night for two weeks and then Monday, Wednesday and Friday nights. 30 g 12  . ezetimibe (ZETIA) 10 MG tablet TAKE ONE TABLET BY MOUTH EVERY DAY 30 tablet 3  . guaifenesin (HUMIBID E) 400 MG TABS tablet Take 400 mg by mouth every 4 (four) hours.    . metoprolol tartrate (LOPRESSOR) 25 MG tablet TAKE ONE TABLET BY MOUTH TWICE DAILY 60 tablet 11  . mirabegron ER (MYRBETRIQ) 50 MG TB24 tablet Take 1 tablet (50 mg total) by mouth daily. 30 tablet 12  . omeprazole (PRILOSEC) 40 MG capsule TAKE ONE CAPSULE BY MOUTH EVERY DAY 30 capsule 2  . pravastatin (PRAVACHOL) 80 MG tablet TAKE ONE TABLET BY MOUTH  AT BEDTIME 90 tablet 1  . psyllium (METAMUCIL) 58.6 % powder Take 1 packet by mouth at bedtime.     . sertraline (ZOLOFT) 100 MG tablet TAKE 1 TABLET IN THE MORNING AND 1/2 TABLET AT BEDTIME 45 tablet 11  . vitamin B-12 (CYANOCOBALAMIN) 1000 MCG tablet Take 1,000 mcg by mouth daily.    Marland Kitchen losartan (COZAAR) 50 MG tablet TAKE ONE TABLET BY MOUTH EVERY DAY 30 tablet 3  . methocarbamol (ROBAXIN) 500 MG tablet Take 0.5-1 tablets (250-500 mg total) by mouth 2 (two) times daily as needed for muscle spasms (sedation precautions). 30 tablet 0  . SPIRIVA HANDIHALER 18 MCG inhalation capsule INHALE CONTENTS OF 1  CAPSULE ONCE DAILY. 30 capsule 5  . aspirin EC 81 MG tablet Take 1 tablet (81 mg total) by mouth every Monday, Wednesday, and Friday.     No facility-administered medications prior to visit.      Per HPI unless specifically indicated in ROS section below Review of Systems     Objective:    BP 120/60 (BP Location: Right Arm, Cuff Size: Large)   Pulse 62   Temp 98 F (36.7 C) (Oral)   Wt 153 lb (69.4 kg)   SpO2 97% Comment: 3 liters of oxygen  BMI 26.89 kg/m   Wt Readings from Last 3 Encounters:  09/21/16 153 lb (69.4 kg)  08/23/16 152 lb 8 oz (69.2 kg)  08/12/16 151 lb 8 oz (68.7 kg)    BP Readings from Last 3 Encounters:  09/21/16 120/60  08/23/16 122/68  08/12/16 120/70    Physical Exam  Constitutional: She is oriented to person, place, and time. She appears well-developed and well-nourished. No distress.  HENT:  Mouth/Throat: Oropharynx is clear and moist. No oropharyngeal exudate.  Resolving goose egg vertex of scalp tender to palpation with surrounding ecchymosis/bruising  Neck: Normal range of motion. Neck supple.  FROM at neck   Cardiovascular: Normal rate, regular rhythm, normal heart sounds and intact distal pulses.   No murmur heard. Pulmonary/Chest: Effort normal and breath sounds normal. No respiratory distress. She has no wheezes. She has no rales.  Musculoskeletal: She exhibits no edema.  No pain midline spine No paraspinous mm tenderness Neg SLR bilaterally. No pain with int/ext rotation at hip. Tender to palpation at R greater trochanteric bursa  Neurological: She is alert and oriented to person, place, and time.  Skin: Skin is warm and dry. Bruising and ecchymosis noted. No rash noted.  Psychiatric: She has a normal mood and affect.  Nursing note and vitals reviewed.  Lab Results  Component Value Date   CREATININE 0.80 08/12/2016    Lab Results  Component Value Date   WBC 7.3 08/12/2016   HGB 13.1 08/12/2016   HCT 39.8 08/12/2016   MCV 91.3  08/12/2016   PLT 161.0 08/12/2016       Assessment & Plan:   Problem List Items Addressed This Visit    Chronic respiratory failure with hypoxia (HCC)   Contusion of scalp    No headaches or vision changes or confusion. Anticipate simple scalp contusion. Not currently on anti platelet agent. rec hold starting aspirin for another 2-3 weeks to allow time for full healing. Pt agrees with plan.       GOLD 4 COPD with emphysema   Relevant Medications   tiotropium (SPIRIVA HANDIHALER) 18 MCG inhalation capsule   Orthostatic syncope - Primary    Anticipate cause of fall. Blood pressure low in office today with positive orthostatic  vital signs - will decrease losartan to '25mg'$  daily (1/2 of current dose). Continue metoprolol '25mg'$  bid.  Discussed importance of good hydration status - pt will work towards increased water intake.  Update if recurrent symptoms for further evaluation/work up. Pt agrees with plan.       Relevant Medications   aspirin EC 81 MG tablet   losartan (COZAAR) 25 MG tablet       Follow up plan: Return if symptoms worsen or fail to improve.  Ria Bush, MD

## 2016-09-21 NOTE — Patient Instructions (Addendum)
Fall may have been from blood pressure dropping too much. Cut losartan in half - total '25mg'$  daily. New dose sent to pharmacy.  Increase water intake. Ok to use ice pack to head and hip.  I think bruising should continue to improve slowly - let us know if any worsening symptoms.

## 2016-09-21 NOTE — Progress Notes (Signed)
Pre visit review using our clinic review tool, if applicable. No additional management support is needed unless otherwise documented below in the visit note. 

## 2016-09-21 NOTE — Assessment & Plan Note (Signed)
No headaches or vision changes or confusion. Anticipate simple scalp contusion. Not currently on anti platelet agent. rec hold starting aspirin for another 2-3 weeks to allow time for full healing. Pt agrees with plan.

## 2016-09-21 NOTE — Assessment & Plan Note (Addendum)
Anticipate cause of fall. Blood pressure low in office today with positive orthostatic vital signs - will decrease losartan to '25mg'$  daily (1/2 of current dose). Continue metoprolol '25mg'$  bid.  Discussed importance of good hydration status - pt will work towards increased water intake.  Update if recurrent symptoms for further evaluation/work up. Pt agrees with plan.

## 2016-09-27 ENCOUNTER — Other Ambulatory Visit: Payer: Self-pay | Admitting: Family Medicine

## 2016-10-04 ENCOUNTER — Ambulatory Visit
Admission: RE | Admit: 2016-10-04 | Discharge: 2016-10-04 | Disposition: A | Discharge status: Home / Self Care | Payer: PPO | Source: Ambulatory Visit | Attending: Family Medicine | Admitting: Family Medicine

## 2016-10-04 DIAGNOSIS — M85851 Other specified disorders of bone density and structure, right thigh: Secondary | ICD-10-CM | POA: Insufficient documentation

## 2016-10-04 DIAGNOSIS — M8580 Other specified disorders of bone density and structure, unspecified site: Secondary | ICD-10-CM

## 2016-10-04 DIAGNOSIS — Z1231 Encounter for screening mammogram for malignant neoplasm of breast: Secondary | ICD-10-CM | POA: Insufficient documentation

## 2016-10-04 DIAGNOSIS — Z780 Asymptomatic menopausal state: Secondary | ICD-10-CM | POA: Insufficient documentation

## 2016-10-12 DIAGNOSIS — J449 Chronic obstructive pulmonary disease, unspecified: Secondary | ICD-10-CM | POA: Diagnosis not present

## 2016-10-13 DIAGNOSIS — J309 Allergic rhinitis, unspecified: Secondary | ICD-10-CM | POA: Diagnosis not present

## 2016-10-13 DIAGNOSIS — R0902 Hypoxemia: Secondary | ICD-10-CM | POA: Diagnosis not present

## 2016-10-15 ENCOUNTER — Telehealth: Payer: Self-pay | Admitting: Pulmonary Disease

## 2016-10-15 NOTE — Telephone Encounter (Signed)
lmtcb x1 for [employer] with [facility].

## 2016-10-19 ENCOUNTER — Telehealth: Payer: Self-pay | Admitting: Pulmonary Disease

## 2016-10-19 MED ORDER — ALBUTEROL SULFATE HFA 108 (90 BASE) MCG/ACT IN AERS: 2.0000 | INHALATION_SPRAY | RESPIRATORY_TRACT | 5 refills | Status: DC | PRN

## 2016-10-19 NOTE — Telephone Encounter (Signed)
I do have this CMN for Dr. Lake Bells to sign and I have already faxed the last office note to them

## 2016-10-19 NOTE — Telephone Encounter (Signed)
Spoke with [facility], who is requesting refills on proair. Rx has been sent to preferred pharmacy. Nothing further needed.

## 2016-10-19 NOTE — Telephone Encounter (Signed)
Called and spoke with Apolonio Schneiders from [place] and she stated that she faxed over a CMN on Friday and will need this signed and faxed back with OV notes on this pt.  Rodena Piety please advise if you have received the CMN.  thanks

## 2016-10-21 NOTE — Telephone Encounter (Signed)
BQ will not be in the office the rest of the week, will hold message until he returns to clinic

## 2016-10-25 ENCOUNTER — Other Ambulatory Visit: Payer: Self-pay | Admitting: Family Medicine

## 2016-10-26 NOTE — Telephone Encounter (Signed)
[name] [name], can you remind BQ to sign these forms tomorrow when he returns? Thanks!

## 2016-10-28 NOTE — Telephone Encounter (Signed)
I did not receive any CMN's for BQ to sign yesterday- Rodena Piety please advise if you still have these.  Thanks.

## 2016-10-28 NOTE — Telephone Encounter (Signed)
[name] [name] have these forms been signed by BQ?  Thanks

## 2016-10-29 NOTE — Telephone Encounter (Signed)
Rodena Piety please advise. thanks

## 2016-11-01 NOTE — Telephone Encounter (Signed)
All the forms that I had for Dr. Lake Bells to sign was given to him on Wednesday morning while he was sitting in Dr. Gustavus Bryant area. I haven't gotten anything back from him yet

## 2016-11-01 NOTE — Telephone Encounter (Signed)
[name] [name] please advise if you still have these CMN's for BQ to sign, or if we need to re-request them.  BQ will be in office tomorrow (11/02/16) to sign.  Thanks!

## 2016-11-02 NOTE — Telephone Encounter (Signed)
All signed forms have been returned to [place] for further completion.

## 2016-11-02 NOTE — Telephone Encounter (Signed)
BQ has his CMN folder and I have confirmed that this CMN is in the folder  Will forward to Ash to let her know to have him sign when he can, thanks

## 2016-11-04 NOTE — Telephone Encounter (Signed)
Rodena Piety please advise if these forms have been completed. Thanks.

## 2016-11-04 NOTE — Telephone Encounter (Signed)
I just called and spoke with [name] [name] with [facility] and she did get the CMN that I faxed to her on 11/02/2016

## 2016-11-13 DIAGNOSIS — R0902 Hypoxemia: Secondary | ICD-10-CM | POA: Diagnosis not present

## 2016-11-13 DIAGNOSIS — J309 Allergic rhinitis, unspecified: Secondary | ICD-10-CM | POA: Diagnosis not present

## 2016-12-13 DIAGNOSIS — R0902 Hypoxemia: Secondary | ICD-10-CM | POA: Diagnosis not present

## 2016-12-13 DIAGNOSIS — J309 Allergic rhinitis, unspecified: Secondary | ICD-10-CM | POA: Diagnosis not present

## 2016-12-28 ENCOUNTER — Other Ambulatory Visit: Payer: Self-pay | Admitting: Family Medicine

## 2016-12-29 ENCOUNTER — Telehealth: Payer: Self-pay | Admitting: Pulmonary Disease

## 2016-12-29 ENCOUNTER — Telehealth: Payer: Self-pay | Admitting: Family Medicine

## 2016-12-29 DIAGNOSIS — J439 Emphysema, unspecified: Secondary | ICD-10-CM

## 2016-12-29 DIAGNOSIS — J9611 Chronic respiratory failure with hypoxia: Secondary | ICD-10-CM

## 2016-12-29 NOTE — Telephone Encounter (Signed)
Pt has  appt on 12/30/16 at 10:45 with Dr Deborra Medina.

## 2016-12-29 NOTE — Telephone Encounter (Signed)
Spoke with patient. She stated that she is not seeking another portable concentrator, she is asking for the "cylinders" that are placed into her machine. She had a hard time explaining actually what she needed. She tried to get some through [facility], but they advised her that she needed to get an order from our office.   Will need to call [facility] in the AM to see exactly what it is that she is requesting since she was confusing at the time of call.

## 2016-12-29 NOTE — Telephone Encounter (Signed)
Patient Name: [name] [name]  DOB: June 02, 1940    Initial Comment Caller states she has neck pain and stiffness    Nurse Assessment  Nurse: Raphael Gibney, RN, Vera Date/Time (Eastern Time): 12/29/2016 11:47:26 AM  Confirm and document reason for call. If symptomatic, describe symptoms. ---Caller states she has neck pain and stiffness. Has had pain and stiffness since Sunday. Hurts to move her neck. Has been cold. Does not know if she is running a fever. Her spouse died on 01/11/23. Pain level 9 during the night. She has taken Tizanidine which helped the pain.  Does the patient have any new or worsening symptoms? ---Yes  Will a triage be completed? ---Yes  Related visit to physician within the last 2 weeks? ---No  Does the PT have any chronic conditions? (i.e. diabetes, asthma, etc.) ---Yes  List chronic conditions. ---COPD  Is this a behavioral health or substance abuse call? ---No     Guidelines    Guideline Title Affirmed Question Affirmed Notes  Neck Pain or Stiffness [1] MODERATE neck pain (e.g., interferes with normal activities AND [2] present > 3 days    Final Disposition User   See PCP When Office is Open (within 3 days) Raphael Gibney, RN, Vanita Ingles    Comments  Appt scheduled for 12/30/2016 at 10:45 am with Dr. Arnette Norris. Pt has 2 Tizandine left and was wondering if she could get more TIzanidine called in as she has appt tomorrow. Please call pt back regarding medication.   Disagree/Comply: Comply

## 2016-12-30 ENCOUNTER — Encounter: Payer: Self-pay | Admitting: Family Medicine

## 2016-12-30 ENCOUNTER — Ambulatory Visit (INDEPENDENT_AMBULATORY_CARE_PROVIDER_SITE_OTHER): Payer: PPO | Admitting: Family Medicine

## 2016-12-30 VITALS — BP 120/70 | HR 65 | Ht 63.25 in | Wt 149.0 lb

## 2016-12-30 DIAGNOSIS — M62838 Other muscle spasm: Secondary | ICD-10-CM

## 2016-12-30 MED ORDER — TIZANIDINE HCL 4 MG PO TABS: 4.0000 mg | ORAL_TABLET | Freq: Four times a day (QID) | ORAL | 0 refills | Status: DC | PRN

## 2016-12-30 NOTE — Progress Notes (Signed)
SUBJECTIVE:  [name] [name] is a 76 y.o. female who complains neck pain for 4 day(s). The pain is positional with movement of neck without radiation of pain down the arms. No known injury but her husband died very recently.  She feels she has been coping as well as can be expected but has obviously been under more physical and emotional stress.  Zanaflex and heating pad have helped but she is out of zanaflex. There is no numbness, tingling, weakness in the arms.  Current Outpatient Prescriptions on File Prior to Visit  Medication Sig Dispense Refill  . albuterol (PROAIR HFA) 108 (90 Base) MCG/ACT inhaler Inhale 2 puffs into the lungs every 4 (four) hours as needed. 1 Inhaler 5  . albuterol (PROVENTIL) (2.5 MG/3ML) 0.083% nebulizer solution Take every 4-6 hours as needed for shortness of breath 75 mL 5  . arformoterol (BROVANA) 15 MCG/2ML NEBU Take 2 mLs (15 mcg total) by nebulization 2 (two) times daily. 120 mL 11  . aspirin EC 81 MG tablet Take 81 mg by mouth every Monday, Wednesday, and Friday.    . Biotin 10 MG CAPS Take 1 capsule by mouth daily.    . budesonide (PULMICORT) 0.5 MG/2ML nebulizer solution Take 2 mLs (0.5 mg total) by nebulization 2 (two) times daily. 60 mL 12  . buPROPion (WELLBUTRIN SR) 150 MG 12 hr tablet TAKE ONE TABLET BY MOUTH TWICE DAILY 60 tablet 3  . cholecalciferol (VITAMIN D) 1000 units tablet Take 2,000 Units by mouth daily.     Marland Kitchen conjugated estrogens (PREMARIN) vaginal cream Place 1 Applicatorful vaginally daily. Apply 0.5mg  (pea-sized amount)  just inside the vaginal introitus with a finger-tip every night for two weeks and then Monday, Wednesday and Friday nights. 30 g 12  . CRANBERRY PO Take 2 tablets by mouth every morning.     Marland Kitchen estradiol (ESTRACE VAGINAL) 0.1 MG/GM vaginal cream Apply 0.5mg  (pea-sized amount)  just inside the vaginal introitus with a finger-tip every night for two weeks and then Monday, Wednesday and Friday nights. 30 g 12  . ezetimibe (ZETIA)  10 MG tablet TAKE ONE TABLET BY MOUTH EVERY DAY 30 tablet 3  . guaifenesin (HUMIBID E) 400 MG TABS tablet Take 400 mg by mouth every 4 (four) hours.    Marland Kitchen losartan (COZAAR) 25 MG tablet Take 1 tablet (25 mg total) by mouth daily. 30 tablet 6  . metoprolol tartrate (LOPRESSOR) 25 MG tablet TAKE ONE TABLET BY MOUTH TWICE DAILY 60 tablet 6  . mirabegron ER (MYRBETRIQ) 50 MG TB24 tablet Take 1 tablet (50 mg total) by mouth daily. 30 tablet 12  . omeprazole (PRILOSEC) 40 MG capsule TAKE ONE CAPSULE BY MOUTH EVERY DAY 30 capsule 5  . pravastatin (PRAVACHOL) 80 MG tablet TAKE ONE TABLET BY MOUTH AT BEDTIME 90 tablet 1  . psyllium (METAMUCIL) 58.6 % powder Take 1 packet by mouth at bedtime.     . sertraline (ZOLOFT) 100 MG tablet TAKE 1 TABLET IN THE MORNING AND 1/2 TABLET AT BEDTIME 45 tablet 11  . tiotropium (SPIRIVA HANDIHALER) 18 MCG inhalation capsule INHALE CONTENTS OF 1 CAPSULE ONCE DAILY. 30 capsule 6  . vitamin B-12 (CYANOCOBALAMIN) 1000 MCG tablet Take 1,000 mcg by mouth daily.     No current facility-administered medications on file prior to visit.     Allergies  Allergen Reactions  . Keflex [Cephalexin] Shortness Of Breath  . Penicillins     REACTION: hives    Past Medical History:  Diagnosis Date  .  Chronic cystitis    recurrent UTI (4+ in last year), on suppressive doxy (uro Cope)  . Colon polyp   . 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 Depression with anxiety   . Diverticular disease 10/09/09   CT abd no sigmoid mass DDD L5/S1  . Dyslipidemia   . GERD (gastroesophageal reflux disease)   . Hematuria    h/o kidney stones  . Hiatal hernia   . Hypertension   . Left scapholunate ligament tear 2014   found on imaging  . Lung cancer ([place]) 1987   Partial RLL Lobectomy.  . Osteoarthritis   . Osteopenia 08/2011   DEXA: L femur -1.6  . Peptic ulcer   . Spinal abscess ([place]) 2000   secondary to periodontal disease    Past Surgical History:  Procedure Laterality Date  . BLADDER  SURGERY    . BREAST BIOPSY Left 03/99   neg  . CHOLECYSTECTOMY  02/10/09  . COLONOSCOPY WITH PROPOFOL N/A 04/30/2014   Procedure: COLONOSCOPY WITH PROPOFOL;  Surgeon: Irene Shipper, MD;  Location: [facility];  Service: Endoscopy;  Laterality: N/A;  . CYSTOSCOPY  02/11/07 and 09/25/08   Dr. Jacqlyn Larsen, normal  . DEXA  08/2011   T femur -1.6, T spine -0.6  . [place] pneumonia sepsis due to gum surgery  . REPLACEMENT TOTAL KNEE  09/2009   Right  . TOTAL ABDOMINAL HYSTERECTOMY  07/18/01   benign reason, BSO, vag repair secondary prolapse  . TUBAL LIGATION     bilateral    Family History  Problem Relation Age of Onset  . Pancreatitis Mother   . Heart disease Father   . Hypertension Father   . Emphysema Father   . Arthritis Brother   . Hypertension Brother   . Cancer Maternal Aunt        ovarian cancer  . Kidney disease Neg Hx   . Prostate cancer Neg Hx   . Bladder Cancer Neg Hx     Social History   Social History  . Marital status: Married    Spouse name: N/A  . Number of children: 2  . Years of education: N/A   Occupational History  . Retired Retired    prior Glass blower/designer at [employer] History Main Topics  . Smoking status: Former Smoker    Packs/day: 3.00    Years: 34.00    Types: Cigarettes    Quit date: 05/29/1995  . Smokeless tobacco: Never Used  . Alcohol use 0.0 oz/week     Comment: wine daily 2-3 glasses/day  . Drug use: No  . Sexual activity: Not on file   Other Topics Concern  . Not on file   Social History Narrative   Caffeine: 4 cups coffee   Remarried for the third time, 2nd husband died of heart attack. Two stepsons   Activity: Walks for exercise   Diet: good water, fruits/vegetables daily      Advanced directed - would want HCPOA to be husband, Karlton Lemon. Does not have set up - requests handout.   The PMH, PSH, Social History, Family History, Medications, and allergies have been reviewed in [facility], and  have been updated if relevant.  OBJECTIVE: BP 120/70   Pulse 65   Ht 5' 3.25" (1.607 m)   Wt 149 lb (67.6 kg)   SpO2 95%   BMI 26.19 kg/m   Vital signs as noted above. Patient appears to be in  mild to moderate pain.  Neck exam: tenderness over trapezial muscles, reduced painful C-spine range of motion. X-Ray: not indicated.  ASSESSMENT:  cervical strain  PLAN: rest the injured area as much as practical, prescription for muscle relaxant Consider Physical Therapy and XRay studies if not improving.  Call or return to clinic prn if these symptoms worsen or fail to improve as anticipated.

## 2016-12-30 NOTE — Telephone Encounter (Signed)
Called [facility] to see if any clarification could be offered as to what pt is referring to.  Rep states that they will have a rep reach out to pt to see what needs to be repaired.  Rep also noted that if it is a piece that needs replacing that our office will not need to place an order for this.    lmtcb X1 for pt to make aware that [employer] will be reaching out to her.

## 2016-12-31 NOTE — Telephone Encounter (Signed)
Spoke with pt, states that [employer] did in fact come out to pt's home yesterday to evaluate O2 issue.  Pt was requesting additional tanks to have but this is not covered by insurance, so pt has chosen not to receive these.  Pt is however requesting that we place an order for a smaller POC.  Pt states the RT who came to her house yesterday noted that there is a smaller poc available if we order it.    BQ please advise if ok to order.  Thanks!

## 2017-01-03 ENCOUNTER — Other Ambulatory Visit: Payer: Self-pay | Admitting: Family Medicine

## 2017-01-03 NOTE — Telephone Encounter (Signed)
Discussed with [name] - since BQ is out of the office until 8.27.18 will go ahead and send the order for POC eval and titration  Called spoke with patient to discuss and make her aware Order sent to verified DME - [facility] Will sign and route to BQ to make him aware

## 2017-01-13 DIAGNOSIS — R0902 Hypoxemia: Secondary | ICD-10-CM | POA: Diagnosis not present

## 2017-01-13 DIAGNOSIS — J309 Allergic rhinitis, unspecified: Secondary | ICD-10-CM | POA: Diagnosis not present

## 2017-01-14 DIAGNOSIS — K219 Gastro-esophageal reflux disease without esophagitis: Secondary | ICD-10-CM | POA: Diagnosis not present

## 2017-01-14 DIAGNOSIS — E785 Hyperlipidemia, unspecified: Secondary | ICD-10-CM | POA: Diagnosis not present

## 2017-01-14 DIAGNOSIS — I10 Essential (primary) hypertension: Secondary | ICD-10-CM | POA: Diagnosis not present

## 2017-01-14 DIAGNOSIS — J449 Chronic obstructive pulmonary disease, unspecified: Secondary | ICD-10-CM | POA: Diagnosis not present

## 2017-01-17 NOTE — Telephone Encounter (Signed)
agree

## 2017-02-09 ENCOUNTER — Other Ambulatory Visit: Payer: Self-pay | Admitting: Family Medicine

## 2017-02-13 DIAGNOSIS — R0902 Hypoxemia: Secondary | ICD-10-CM | POA: Diagnosis not present

## 2017-02-13 DIAGNOSIS — J309 Allergic rhinitis, unspecified: Secondary | ICD-10-CM | POA: Diagnosis not present

## 2017-02-24 ENCOUNTER — Other Ambulatory Visit: Payer: Self-pay | Admitting: Family Medicine

## 2017-03-02 DIAGNOSIS — D518 Other vitamin B12 deficiency anemias: Secondary | ICD-10-CM | POA: Diagnosis not present

## 2017-03-02 DIAGNOSIS — E7849 Other hyperlipidemia: Secondary | ICD-10-CM | POA: Diagnosis not present

## 2017-03-02 DIAGNOSIS — Z79899 Other long term (current) drug therapy: Secondary | ICD-10-CM | POA: Diagnosis not present

## 2017-03-02 DIAGNOSIS — E119 Type 2 diabetes mellitus without complications: Secondary | ICD-10-CM | POA: Diagnosis not present

## 2017-03-02 DIAGNOSIS — E038 Other specified hypothyroidism: Secondary | ICD-10-CM | POA: Diagnosis not present

## 2017-03-02 DIAGNOSIS — E559 Vitamin D deficiency, unspecified: Secondary | ICD-10-CM | POA: Diagnosis not present

## 2017-03-04 DIAGNOSIS — R278 Other lack of coordination: Secondary | ICD-10-CM | POA: Diagnosis not present

## 2017-03-10 DIAGNOSIS — R278 Other lack of coordination: Secondary | ICD-10-CM | POA: Diagnosis not present

## 2017-03-15 DIAGNOSIS — R0902 Hypoxemia: Secondary | ICD-10-CM | POA: Diagnosis not present

## 2017-03-15 DIAGNOSIS — J309 Allergic rhinitis, unspecified: Secondary | ICD-10-CM | POA: Diagnosis not present

## 2017-03-18 DIAGNOSIS — I10 Essential (primary) hypertension: Secondary | ICD-10-CM | POA: Diagnosis not present

## 2017-03-18 DIAGNOSIS — J449 Chronic obstructive pulmonary disease, unspecified: Secondary | ICD-10-CM | POA: Diagnosis not present

## 2017-03-18 DIAGNOSIS — E785 Hyperlipidemia, unspecified: Secondary | ICD-10-CM | POA: Diagnosis not present

## 2017-03-18 DIAGNOSIS — K219 Gastro-esophageal reflux disease without esophagitis: Secondary | ICD-10-CM | POA: Diagnosis not present

## 2017-03-22 DIAGNOSIS — M79675 Pain in left toe(s): Secondary | ICD-10-CM | POA: Diagnosis not present

## 2017-03-22 DIAGNOSIS — B351 Tinea unguium: Secondary | ICD-10-CM | POA: Diagnosis not present

## 2017-03-22 DIAGNOSIS — M21611 Bunion of right foot: Secondary | ICD-10-CM | POA: Diagnosis not present

## 2017-03-23 DIAGNOSIS — R278 Other lack of coordination: Secondary | ICD-10-CM | POA: Diagnosis not present

## 2017-04-19 DIAGNOSIS — Z0000 Encounter for general adult medical examination without abnormal findings: Secondary | ICD-10-CM | POA: Diagnosis not present

## 2017-04-26 DIAGNOSIS — D518 Other vitamin B12 deficiency anemias: Secondary | ICD-10-CM | POA: Diagnosis not present

## 2017-04-26 DIAGNOSIS — Z79899 Other long term (current) drug therapy: Secondary | ICD-10-CM | POA: Diagnosis not present

## 2017-04-26 DIAGNOSIS — E119 Type 2 diabetes mellitus without complications: Secondary | ICD-10-CM | POA: Diagnosis not present

## 2017-04-26 DIAGNOSIS — E7849 Other hyperlipidemia: Secondary | ICD-10-CM | POA: Diagnosis not present

## 2017-04-26 DIAGNOSIS — E038 Other specified hypothyroidism: Secondary | ICD-10-CM | POA: Diagnosis not present

## 2017-04-26 DIAGNOSIS — E559 Vitamin D deficiency, unspecified: Secondary | ICD-10-CM | POA: Diagnosis not present

## 2017-04-27 DIAGNOSIS — R278 Other lack of coordination: Secondary | ICD-10-CM | POA: Diagnosis not present

## 2017-04-27 NOTE — Progress Notes (Deleted)
04/28/2017 12:53 PM   [name] [name] [birth year] [id number]  Referring provider: Ria Bush, MD [place], [place] [address]  No chief complaint on file.   HPI: Patient is a 76 -year-old Caucasian female who presents today for one year follow-up after for vaginal atrophy, a history of recurrent urinary tract infections and urge incontinence.     Vaginal atrophy Patient is using her vaginal estrogen cream 3 nights weekly. She has not had an UTI since her last visit with [place].    History of recurrent urinary tract infections Patient was referred to [place] by, Dr. Danise Mina, for recurrent urinary tract infections.  She does have a history of GU surgery (bladder sling with Dr. Jacqlyn Larsen 2005).  Reviewing her records,  she has had three infections.  Each positive for E. Coli.  KUB performed on 01/13/2016 noted moderately increased colonic stool burden reflecting constipation. No definite urinary tract stones were seen.   Renal ultrasound performed on 01/23/2016 noted no hydronephrosis or other acute abnormalities of the kidneys.   She has not had an UTI since her last visit with [place].   Urge incontinence Her PVR today is 17 mL.  She is having urgency and urge incontinence.  She is finding the Myrbetriq 50 mg daily very effective.  She would like to continue the medication.  The patient has been experiencing urgency x *** (***), frequency x *** (***), not/is restricting fluids to avoid visits to the restroom ***, not/is engaging in toilet mapping, incontinence x *** (***) and nocturia x *** (***).    Her BP is ***.   Her PVR is ***.     PMH: Past Medical History:  Diagnosis Date  . Chronic cystitis    recurrent UTI (4+ in last year), on suppressive doxy (uro Cope)  . Colon polyp   . 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 Depression with anxiety   . Diverticular disease 10/09/09   CT abd no sigmoid mass DDD L5/S1  . Dyslipidemia   . GERD (gastroesophageal reflux disease)   .  Hematuria    h/o kidney stones  . Hiatal hernia   . Hypertension   . Left scapholunate ligament tear 2014   found on imaging  . Lung cancer ([place]) 1987   Partial RLL Lobectomy.  . Osteoarthritis   . Osteopenia 08/2011   DEXA: L femur -1.6  . Peptic ulcer   . Spinal abscess ([place]) 2000   secondary to periodontal disease    Surgical History: Past Surgical History:  Procedure Laterality Date  . BLADDER SURGERY    . BREAST BIOPSY Left 03/99   neg  . CHOLECYSTECTOMY  02/10/09  . COLONOSCOPY WITH PROPOFOL N/A 04/30/2014   Procedure: COLONOSCOPY WITH PROPOFOL;  Surgeon: Irene Shipper, MD;  Location: [facility];  Service: Endoscopy;  Laterality: N/A;  . CYSTOSCOPY  02/11/07 and 09/25/08   Dr. Jacqlyn Larsen, normal  . DEXA  08/2011   T femur -1.6, T spine -0.6  . [place] pneumonia sepsis due to gum surgery  . REPLACEMENT TOTAL KNEE  09/2009   Right  . TOTAL ABDOMINAL HYSTERECTOMY  07/18/01   benign reason, BSO, vag repair secondary prolapse  . TUBAL LIGATION     bilateral    Home Medications:  Allergies as of 04/28/2017      Reactions   Keflex [cephalexin] Shortness Of Breath   Penicillins    REACTION: hives  Medication List        Accurate as of 04/27/17 12:53 PM. Always use your most recent med list.          albuterol (2.5 MG/3ML) 0.083% nebulizer solution Commonly known as:  PROVENTIL Take every 4-6 hours as needed for shortness of breath   albuterol 108 (90 Base) MCG/ACT inhaler Commonly known as:  PROAIR HFA Inhale 2 puffs into the lungs every 4 (four) hours as needed.   arformoterol 15 MCG/2ML Nebu Commonly known as:  BROVANA Take 2 mLs (15 mcg total) by nebulization 2 (two) times daily.   aspirin EC 81 MG tablet Take 81 mg by mouth every Monday, Wednesday, and Friday.   Biotin 10 MG Caps Take 1 capsule by mouth daily.   budesonide 0.5 MG/2ML nebulizer solution Commonly known as:  PULMICORT USE THE CONTENTS OF ONE VIAL (2ML) BY  NEBULIZATION TWICE A DAY.   buPROPion 150 MG 12 hr tablet Commonly known as:  WELLBUTRIN SR TAKE ONE TABLET BY MOUTH TWICE DAILY   cholecalciferol 1000 units tablet Commonly known as:  VITAMIN D Take 2,000 Units by mouth daily.   conjugated estrogens vaginal cream Commonly known as:  PREMARIN Place 1 Applicatorful vaginally daily. Apply 0.5mg  (pea-sized amount)  just inside the vaginal introitus with a finger-tip every night for two weeks and then Monday, Wednesday and Friday nights.   CRANBERRY PO Take 2 tablets by mouth every morning.   estradiol 0.1 MG/GM vaginal cream Commonly known as:  ESTRACE VAGINAL Apply 0.5mg  (pea-sized amount)  just inside the vaginal introitus with a finger-tip every night for two weeks and then Monday, Wednesday and Friday nights.   ezetimibe 10 MG tablet Commonly known as:  ZETIA TAKE ONE TABLET BY MOUTH EVERY DAY   guaifenesin 400 MG Tabs tablet Commonly known as:  HUMIBID E Take 400 mg by mouth every 4 (four) hours.   losartan 25 MG tablet Commonly known as:  COZAAR Take 1 tablet (25 mg total) by mouth daily.   metoprolol tartrate 25 MG tablet Commonly known as:  LOPRESSOR TAKE ONE TABLET BY MOUTH TWICE DAILY   mirabegron ER 50 MG Tb24 tablet Commonly known as:  MYRBETRIQ Take 1 tablet (50 mg total) by mouth daily.   omeprazole 40 MG capsule Commonly known as:  PRILOSEC TAKE ONE CAPSULE BY MOUTH EVERY DAY   pravastatin 80 MG tablet Commonly known as:  PRAVACHOL TAKE ONE TABLET BY MOUTH AT BEDTIME   psyllium 58.6 % powder Commonly known as:  METAMUCIL Take 1 packet by mouth at bedtime.   sertraline 100 MG tablet Commonly known as:  ZOLOFT TAKE 1 TABLET IN THE MORNING AND 1/2 TABLET AT BEDTIME   tiotropium 18 MCG inhalation capsule Commonly known as:  SPIRIVA HANDIHALER INHALE CONTENTS OF 1 CAPSULE ONCE DAILY.   tiZANidine 4 MG tablet Commonly known as:  ZANAFLEX Take 1 tablet (4 mg total) by mouth every 6 (six) hours as  needed for muscle spasms.   vitamin B-12 1000 MCG tablet Commonly known as:  CYANOCOBALAMIN Take 1,000 mcg by mouth daily.       Allergies:  Allergies  Allergen Reactions  . Keflex [Cephalexin] Shortness Of Breath  . Penicillins     REACTION: hives    Family History: Family History  Problem Relation Age of Onset  . Pancreatitis Mother   . Heart disease Father   . Hypertension Father   . Emphysema Father   . Arthritis Brother   . Hypertension Brother   .  Cancer Maternal Aunt        ovarian cancer  . Kidney disease Neg Hx   . Prostate cancer Neg Hx   . Bladder Cancer Neg Hx     Social History:  reports that she quit smoking about 21 years ago. Her smoking use included cigarettes. She has a 102.00 pack-year smoking history. she has never used smokeless tobacco. She reports that she drinks alcohol. She reports that she does not use drugs.  ROS:                                        Physical Exam: There were no vitals taken for this visit.  Constitutional: Well nourished. Alert and oriented, No acute distress. HEENT: [place] AT, moist mucus membranes. Trachea midline, no masses. Cardiovascular: No clubbing, cyanosis, or edema. Respiratory: Normal respiratory effort, no increased work of breathing. Skin: No rashes, bruises or suspicious lesions. Lymph: No cervical or inguinal adenopathy. Neurologic: Grossly intact, no focal deficits, moving all 4 extremities. Psychiatric: Normal mood and affect.  Laboratory Data: Lab Results  Component Value Date   WBC 7.3 08/12/2016   HGB 13.1 08/12/2016   HCT 39.8 08/12/2016   MCV 91.3 08/12/2016   PLT 161.0 08/12/2016    Lab Results  Component Value Date   CREATININE 0.80 08/12/2016     Lab Results  Component Value Date   HGBA1C 5.5 02/13/2014    Lab Results  Component Value Date   TSH 1.41 08/12/2016       Component Value Date/Time   CHOL 172 08/12/2016 1442   HDL 75.70 08/12/2016 1442     CHOLHDL 2 08/12/2016 1442   VLDL 25.2 08/12/2016 1442   LDLCALC 71 08/12/2016 1442    Lab Results  Component Value Date   AST 19 08/12/2016   Lab Results  Component Value Date   ALT 18 08/12/2016     Pertinent Imaging: CLINICAL DATA:  Pelvic pain for the past 3 weeks. Hematuria in the past but not currently. History of recurrent urinary tract infections.  EXAM: ABDOMEN - 1 VIEW  COMPARISON:  KUB dated Oct 19, 2009  FINDINGS: The colonic stool burden is mildly increased. No excessive gas is observed. There is no small or large bowel obstructive pattern. There is stool and gas in the rectum. No definite stones overlie the kidneys. There are calcifications in the upper aspect of the true bony pelvis which likely lie in the iliac arteries. No definite ureteral stones are observed today. There are surgical clips in the gallbladder fossa. There is blunting of the right lateral costophrenic angle. There is degenerative change of the lower lumbar spine and of the left hip.  IMPRESSION: Moderately increased colonic stool burden may reflect constipation in the appropriate clinical setting. No definite urinary tract stones. If the patient's symptoms persist, abdominal and pelvic CT scanning would be a useful next imaging step.   Electronically Signed   By: David  Martinique M.D.   On: 01/13/2016 15:08  CLINICAL DATA:  Two years of bilateral flank pain.  EXAM: RENAL / URINARY TRACT ULTRASOUND COMPLETE  COMPARISON:  Abdominal and pelvic CT scan of Oct 09, 2009  FINDINGS: Right Kidney:  Length: 9.5 cm. The renal cortical echotexture remains lower than that of the adjacent liver. There is no hydronephrosis. No stones are evident.  Left Kidney:  Length: 10.9 cm. The renal cortical echotexture remains lower  than that of the adjacent liver. There is no hydronephrosis. No stones are observed.  Bladder:  The partially distended urinary bladder is normal.  Bilateral ureteral jets are observed.  IMPRESSION: No hydronephrosis nor other acute abnormality of either kidney.   Electronically Signed   By: David  Martinique M.D.   On: 01/23/2016 14:48  Results for [name] [name], [name] [name] (MRN [id number]) as of 04/28/2016 15:13  Ref. Range 04/28/2016 15:06  Scan Result Unknown 17    Assessment & Plan:    1. Recurrent UTI  - Reviewed with preventative strategies   - Reminded the patient to contact our office with symptoms of urinary tract infections   - Reminded patient that if we are unavailable that she should request a catheterized specimen   2. Vaginal atrophy  - Patient will continue the vaginal estrogen cream 3 nights weekly  - Sample of Estrace cream is given; refills given  - Patient is encouraged to contact our office for samples when she is unable to acquire the cream  - RTC in one year for exam  3. Urge incontinence  - BLADDER SCAN AMB NON-IMAGING  - Prescription for Myrbetriq 50 mg as sent to pharmacy  - Patient given samples of her Myrbetriq 50 mg  - RTC in one year for PVR and OAB questionnaire  No Follow-up on file.  These notes generated with voice recognition software. I apologize for typographical errors.  Zara Council, [facility] [address] [place], [place] [address] [phone]

## 2017-04-28 ENCOUNTER — Ambulatory Visit: Payer: Medicare Other | Admitting: Urology

## 2017-04-28 ENCOUNTER — Encounter: Payer: Self-pay | Admitting: Urology

## 2017-05-02 NOTE — Progress Notes (Deleted)
05/03/2017 11:55 AM   [name] [name] 1940-09-25 [id number]  Referring provider: Ria Bush, MD [place], [place] [address]  No chief complaint on file.   HPI: Patient is a 76 -year-old Caucasian female who presents today for one year follow-up after for vaginal atrophy, a history of recurrent urinary tract infections and urge incontinence.     Vaginal atrophy Patient is using her vaginal estrogen cream 3 nights weekly. She has not had an UTI since her last visit with [place].    History of recurrent urinary tract infections Patient was referred to [place] by, Dr. Danise Mina, for recurrent urinary tract infections.  She does have a history of GU surgery (bladder sling with Dr. Jacqlyn Larsen 2005).  Reviewing her records,  she has had three infections.  Each positive for E. Coli.  KUB performed on 01/13/2016 noted moderately increased colonic stool burden reflecting constipation. No definite urinary tract stones were seen.   Renal ultrasound performed on 01/23/2016 noted no hydronephrosis or other acute abnormalities of the kidneys.   She has not had an UTI since her last visit with [place].   Urge incontinence Her PVR today is 17 mL.  She is having urgency and urge incontinence.  She is finding the Myrbetriq 50 mg daily very effective.  She would like to continue the medication.  The patient has been experiencing urgency x *** (***), frequency x *** (***), not/is restricting fluids to avoid visits to the restroom ***, not/is engaging in toilet mapping, incontinence x *** (***) and nocturia x *** (***).    Her BP is ***.   Her PVR is ***.     PMH: Past Medical History:  Diagnosis Date  . Chronic cystitis    recurrent UTI (4+ in last year), on suppressive doxy (uro Cope)  . Colon polyp   . 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 Depression with anxiety   . Diverticular disease 10/09/09   CT abd no sigmoid mass DDD L5/S1  . Dyslipidemia   . GERD (gastroesophageal reflux disease)   .  Hematuria    h/o kidney stones  . Hiatal hernia   . Hypertension   . Left scapholunate ligament tear 2014   found on imaging  . Lung cancer ([place]) 1987   Partial RLL Lobectomy.  . Osteoarthritis   . Osteopenia 08/2011   DEXA: L femur -1.6  . Peptic ulcer   . Spinal abscess ([place]) 2000   secondary to periodontal disease    Surgical History: Past Surgical History:  Procedure Laterality Date  . BLADDER SURGERY    . BREAST BIOPSY Left 03/99   neg  . CHOLECYSTECTOMY  02/10/09  . COLONOSCOPY WITH PROPOFOL N/A 04/30/2014   Procedure: COLONOSCOPY WITH PROPOFOL;  Surgeon: Irene Shipper, MD;  Location: [facility];  Service: Endoscopy;  Laterality: N/A;  . CYSTOSCOPY  02/11/07 and 09/25/08   Dr. Jacqlyn Larsen, normal  . DEXA  08/2011   T femur -1.6, T spine -0.6  . [place] pneumonia sepsis due to gum surgery  . REPLACEMENT TOTAL KNEE  09/2009   Right  . TOTAL ABDOMINAL HYSTERECTOMY  07/18/01   benign reason, BSO, vag repair secondary prolapse  . TUBAL LIGATION     bilateral    Home Medications:  Allergies as of 05/03/2017      Reactions   Keflex [cephalexin] Shortness Of Breath   Penicillins    REACTION: hives  Medication List        Accurate as of 05/02/17 11:55 AM. Always use your most recent med list.          albuterol (2.5 MG/3ML) 0.083% nebulizer solution Commonly known as:  PROVENTIL Take every 4-6 hours as needed for shortness of breath   albuterol 108 (90 Base) MCG/ACT inhaler Commonly known as:  PROAIR HFA Inhale 2 puffs into the lungs every 4 (four) hours as needed.   arformoterol 15 MCG/2ML Nebu Commonly known as:  BROVANA Take 2 mLs (15 mcg total) by nebulization 2 (two) times daily.   aspirin EC 81 MG tablet Take 81 mg by mouth every Monday, Wednesday, and Friday.   Biotin 10 MG Caps Take 1 capsule by mouth daily.   budesonide 0.5 MG/2ML nebulizer solution Commonly known as:  PULMICORT USE THE CONTENTS OF ONE VIAL (2ML)  BY NEBULIZATION TWICE A DAY.   buPROPion 150 MG 12 hr tablet Commonly known as:  WELLBUTRIN SR TAKE ONE TABLET BY MOUTH TWICE DAILY   cholecalciferol 1000 units tablet Commonly known as:  VITAMIN D Take 2,000 Units by mouth daily.   conjugated estrogens vaginal cream Commonly known as:  PREMARIN Place 1 Applicatorful vaginally daily. Apply 0.5mg  (pea-sized amount)  just inside the vaginal introitus with a finger-tip every night for two weeks and then Monday, Wednesday and Friday nights.   CRANBERRY PO Take 2 tablets by mouth every morning.   estradiol 0.1 MG/GM vaginal cream Commonly known as:  ESTRACE VAGINAL Apply 0.5mg  (pea-sized amount)  just inside the vaginal introitus with a finger-tip every night for two weeks and then Monday, Wednesday and Friday nights.   ezetimibe 10 MG tablet Commonly known as:  ZETIA TAKE ONE TABLET BY MOUTH EVERY DAY   guaifenesin 400 MG Tabs tablet Commonly known as:  HUMIBID E Take 400 mg by mouth every 4 (four) hours.   losartan 25 MG tablet Commonly known as:  COZAAR Take 1 tablet (25 mg total) by mouth daily.   metoprolol tartrate 25 MG tablet Commonly known as:  LOPRESSOR TAKE ONE TABLET BY MOUTH TWICE DAILY   mirabegron ER 50 MG Tb24 tablet Commonly known as:  MYRBETRIQ Take 1 tablet (50 mg total) by mouth daily.   omeprazole 40 MG capsule Commonly known as:  PRILOSEC TAKE ONE CAPSULE BY MOUTH EVERY DAY   pravastatin 80 MG tablet Commonly known as:  PRAVACHOL TAKE ONE TABLET BY MOUTH AT BEDTIME   psyllium 58.6 % powder Commonly known as:  METAMUCIL Take 1 packet by mouth at bedtime.   sertraline 100 MG tablet Commonly known as:  ZOLOFT TAKE 1 TABLET IN THE MORNING AND 1/2 TABLET AT BEDTIME   tiotropium 18 MCG inhalation capsule Commonly known as:  SPIRIVA HANDIHALER INHALE CONTENTS OF 1 CAPSULE ONCE DAILY.   tiZANidine 4 MG tablet Commonly known as:  ZANAFLEX Take 1 tablet (4 mg total) by mouth every 6 (six) hours as  needed for muscle spasms.   vitamin B-12 1000 MCG tablet Commonly known as:  CYANOCOBALAMIN Take 1,000 mcg by mouth daily.       Allergies:  Allergies  Allergen Reactions  . Keflex [Cephalexin] Shortness Of Breath  . Penicillins     REACTION: hives    Family History: Family History  Problem Relation Age of Onset  . Pancreatitis Mother   . Heart disease Father   . Hypertension Father   . Emphysema Father   . Arthritis Brother   . Hypertension Brother   .  Cancer Maternal Aunt        ovarian cancer  . Kidney disease Neg Hx   . Prostate cancer Neg Hx   . Bladder Cancer Neg Hx     Social History:  reports that she quit smoking about 21 years ago. Her smoking use included cigarettes. She has a 102.00 pack-year smoking history. she has never used smokeless tobacco. She reports that she drinks alcohol. She reports that she does not use drugs.  ROS:                                        Physical Exam: There were no vitals taken for this visit.  Constitutional: Well nourished. Alert and oriented, No acute distress. HEENT: [place] AT, moist mucus membranes. Trachea midline, no masses. Cardiovascular: No clubbing, cyanosis, or edema. Respiratory: Normal respiratory effort, no increased work of breathing. Skin: No rashes, bruises or suspicious lesions. Lymph: No cervical or inguinal adenopathy. Neurologic: Grossly intact, no focal deficits, moving all 4 extremities. Psychiatric: Normal mood and affect.  Laboratory Data: Lab Results  Component Value Date   WBC 7.3 08/12/2016   HGB 13.1 08/12/2016   HCT 39.8 08/12/2016   MCV 91.3 08/12/2016   PLT 161.0 08/12/2016    Lab Results  Component Value Date   CREATININE 0.80 08/12/2016     Lab Results  Component Value Date   HGBA1C 5.5 02/13/2014    Lab Results  Component Value Date   TSH 1.41 08/12/2016       Component Value Date/Time   CHOL 172 08/12/2016 1442   HDL 75.70 08/12/2016 1442     CHOLHDL 2 08/12/2016 1442   VLDL 25.2 08/12/2016 1442   LDLCALC 71 08/12/2016 1442    Lab Results  Component Value Date   AST 19 08/12/2016   Lab Results  Component Value Date   ALT 18 08/12/2016     Pertinent Imaging: CLINICAL DATA:  Pelvic pain for the past 3 weeks. Hematuria in the past but not currently. History of recurrent urinary tract infections.  EXAM: ABDOMEN - 1 VIEW  COMPARISON:  KUB dated Oct 19, 2009  FINDINGS: The colonic stool burden is mildly increased. No excessive gas is observed. There is no small or large bowel obstructive pattern. There is stool and gas in the rectum. No definite stones overlie the kidneys. There are calcifications in the upper aspect of the true bony pelvis which likely lie in the iliac arteries. No definite ureteral stones are observed today. There are surgical clips in the gallbladder fossa. There is blunting of the right lateral costophrenic angle. There is degenerative change of the lower lumbar spine and of the left hip.  IMPRESSION: Moderately increased colonic stool burden may reflect constipation in the appropriate clinical setting. No definite urinary tract stones. If the patient's symptoms persist, abdominal and pelvic CT scanning would be a useful next imaging step.   Electronically Signed   By: David  Martinique M.D.   On: 01/13/2016 15:08  CLINICAL DATA:  Two years of bilateral flank pain.  EXAM: RENAL / URINARY TRACT ULTRASOUND COMPLETE  COMPARISON:  Abdominal and pelvic CT scan of Oct 09, 2009  FINDINGS: Right Kidney:  Length: 9.5 cm. The renal cortical echotexture remains lower than that of the adjacent liver. There is no hydronephrosis. No stones are evident.  Left Kidney:  Length: 10.9 cm. The renal cortical echotexture remains lower  than that of the adjacent liver. There is no hydronephrosis. No stones are observed.  Bladder:  The partially distended urinary bladder is normal.  Bilateral ureteral jets are observed.  IMPRESSION: No hydronephrosis nor other acute abnormality of either kidney.   Electronically Signed   By: David  Martinique M.D.   On: 01/23/2016 14:48  Results for DIAHN, WAIDELICH (MRN [id number]) as of 04/28/2016 15:13  Ref. Range 04/28/2016 15:06  Scan Result Unknown 17    Assessment & Plan:    1. Recurrent UTI  - Reviewed with preventative strategies   - Reminded the patient to contact our office with symptoms of urinary tract infections   - Reminded patient that if we are unavailable that she should request a catheterized specimen   2. Vaginal atrophy  - Patient will continue the vaginal estrogen cream 3 nights weekly  - Sample of Estrace cream is given; refills given  - Patient is encouraged to contact our office for samples when she is unable to acquire the cream  - RTC in one year for exam  3. Urge incontinence  - BLADDER SCAN AMB NON-IMAGING  - Prescription for Myrbetriq 50 mg as sent to pharmacy  - Patient given samples of her Myrbetriq 50 mg  - RTC in one year for PVR and OAB questionnaire  No Follow-up on file.  These notes generated with voice recognition software. I apologize for typographical errors.  Zara Council, [facility] [address], [place], [place] [address] [phone]

## 2017-05-03 ENCOUNTER — Ambulatory Visit: Payer: Self-pay | Admitting: Urology

## 2017-05-10 NOTE — Progress Notes (Signed)
05/11/2017 2:33 PM   [name] [name] [birth year] [id number]  Referring provider: Ria Bush, MD [address] [place], [place] [address]  Chief Complaint  Patient presents with  . Urinary Incontinence    1year    HPI: Patient is a 76 -year-old Caucasian female who presents today for one year follow-up after for vaginal atrophy, a history of recurrent urinary tract infections and urge incontinence.     Vaginal atrophy Patient is using her vaginal estrogen occasionally.  She has not had an UTI since her last visit with [place].    History of recurrent urinary tract infections Patient was referred to [place] by, Dr. Danise Mina, for recurrent urinary tract infections.  She does have a history of GU surgery (bladder sling with Dr. Jacqlyn Larsen 2005).  Reviewing her records,  she has had three infections.  Each positive for E. Coli.  KUB performed on 01/13/2016 noted moderately increased colonic stool burden reflecting constipation. No definite urinary tract stones were seen.   Renal ultrasound performed on 01/23/2016 noted no hydronephrosis or other acute abnormalities of the kidneys.   She has not had an UTI since her last visit with [place].   Urge incontinence She is experiencing urgency x 0-3, frequency x 4-7, not restricting fluids to avoid visits to the restroom, is engaging in toilet mapping, incontinence x 4-7 and nocturia x 0-3.    Her BP is 176/74.   She is no longer taking the Myrbetriq as it was cost prohibitive.  She has actually noted improvement in her urinary symptoms over the last few months.     PMH: Past Medical History:  Diagnosis Date  . Chronic cystitis    recurrent UTI (4+ in last year), on suppressive doxy (uro Cope)  . Colon polyp   . 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 Depression with anxiety   . Diverticular disease 10/09/09   CT abd no sigmoid mass DDD L5/S1  . Dyslipidemia   . GERD (gastroesophageal reflux disease)   . Hematuria    h/o kidney stones  . Hiatal  hernia   . Hypertension   . Left scapholunate ligament tear 2014   found on imaging  . Lung cancer ([place]) 1987   Partial RLL Lobectomy.  . Osteoarthritis   . Osteopenia 08/2011   DEXA: L femur -1.6  . Peptic ulcer   . Spinal abscess ([place]) 2000   secondary to periodontal disease    Surgical History: Past Surgical History:  Procedure Laterality Date  . BLADDER SURGERY    . BREAST BIOPSY Left 03/99   neg  . CHOLECYSTECTOMY  02/10/09  . COLONOSCOPY WITH PROPOFOL N/A 04/30/2014   Procedure: COLONOSCOPY WITH PROPOFOL;  Surgeon: Irene Shipper, MD;  Location: [facility];  Service: Endoscopy;  Laterality: N/A;  . CYSTOSCOPY  02/11/07 and 09/25/08   Dr. Jacqlyn Larsen, normal  . DEXA  08/2011   T femur -1.6, T spine -0.6  . [place] pneumonia sepsis due to gum surgery  . REPLACEMENT TOTAL KNEE  09/2009   Right  . TOTAL ABDOMINAL HYSTERECTOMY  07/18/01   benign reason, BSO, vag repair secondary prolapse  . TUBAL LIGATION     bilateral    Home Medications:  Allergies as of 05/11/2017      Reactions   Keflex [cephalexin] Shortness Of Breath   Penicillins    REACTION: hives      Medication List  Accurate as of 05/11/17  2:33 PM. Always use your most recent med list.          albuterol (2.5 MG/3ML) 0.083% nebulizer solution Commonly known as:  PROVENTIL Take every 4-6 hours as needed for shortness of breath   albuterol 108 (90 Base) MCG/ACT inhaler Commonly known as:  PROAIR HFA Inhale 2 puffs into the lungs every 4 (four) hours as needed.   arformoterol 15 MCG/2ML Nebu Commonly known as:  BROVANA Take 2 mLs (15 mcg total) by nebulization 2 (two) times daily.   Biotin 10 MG Caps Take 1 capsule by mouth daily.   budesonide 0.5 MG/2ML nebulizer solution Commonly known as:  PULMICORT USE THE CONTENTS OF ONE VIAL (2ML) BY NEBULIZATION TWICE A DAY.   buPROPion 150 MG 12 hr tablet Commonly known as:  WELLBUTRIN SR TAKE ONE TABLET BY MOUTH TWICE  DAILY   cholecalciferol 1000 units tablet Commonly known as:  VITAMIN D Take 2,000 Units by mouth daily.   CRANBERRY PO Take 2 tablets by mouth every morning.   estradiol 0.1 MG/GM vaginal cream Commonly known as:  ESTRACE VAGINAL Apply 0.5mg  (pea-sized amount)  just inside the vaginal introitus with a finger-tip every night for two weeks and then Monday, Wednesday and Friday nights.   ezetimibe 10 MG tablet Commonly known as:  ZETIA TAKE ONE TABLET BY MOUTH EVERY DAY   losartan 25 MG tablet Commonly known as:  COZAAR Take 1 tablet (25 mg total) by mouth daily.   metoprolol tartrate 25 MG tablet Commonly known as:  LOPRESSOR TAKE ONE TABLET BY MOUTH TWICE DAILY   omeprazole 40 MG capsule Commonly known as:  PRILOSEC TAKE ONE CAPSULE BY MOUTH EVERY DAY   pravastatin 80 MG tablet Commonly known as:  PRAVACHOL TAKE ONE TABLET BY MOUTH AT BEDTIME   psyllium 58.6 % powder Commonly known as:  METAMUCIL Take 1 packet by mouth at bedtime.   sertraline 100 MG tablet Commonly known as:  ZOLOFT TAKE 1 TABLET IN THE MORNING AND 1/2 TABLET AT BEDTIME   tiotropium 18 MCG inhalation capsule Commonly known as:  SPIRIVA HANDIHALER INHALE CONTENTS OF 1 CAPSULE ONCE DAILY.       Allergies:  Allergies  Allergen Reactions  . Keflex [Cephalexin] Shortness Of Breath  . Penicillins     REACTION: hives    Family History: Family History  Problem Relation Age of Onset  . Pancreatitis Mother   . Heart disease Father   . Hypertension Father   . Emphysema Father   . Arthritis Brother   . Hypertension Brother   . Cancer Maternal Aunt        ovarian cancer  . Kidney disease Neg Hx   . Prostate cancer Neg Hx   . Bladder Cancer Neg Hx     Social History:  reports that she quit smoking about 21 years ago. Her smoking use included cigarettes. She has a 102.00 pack-year smoking history. she has never used smokeless tobacco. She reports that she drinks alcohol. She reports that she  does not use drugs.  ROS: UROLOGY Frequent Urination?: No Hard to postpone urination?: No Burning/pain with urination?: No Get up at night to urinate?: No Leakage of urine?: No Urine stream starts and stops?: No Trouble starting stream?: No Do you have to strain to urinate?: No Blood in urine?: No Urinary tract infection?: No Sexually transmitted disease?: No Injury to kidneys or bladder?: No Painful intercourse?: No Weak stream?: No Currently pregnant?: No Vaginal bleeding?: No  Last menstrual period?: n  Gastrointestinal Nausea?: No Vomiting?: No Indigestion/heartburn?: No Diarrhea?: No Constipation?: No  Constitutional Fever: No Night sweats?: No Weight loss?: No Fatigue?: No  Skin Skin rash/lesions?: No Itching?: No  Eyes Blurred vision?: No Double vision?: No  Ears/Nose/Throat Sore throat?: No Sinus problems?: Yes  Hematologic/Lymphatic Swollen glands?: No Easy bruising?: No  Cardiovascular Leg swelling?: No Chest pain?: No  Respiratory Cough?: No Shortness of breath?: Yes  Endocrine Excessive thirst?: No  Musculoskeletal Back pain?: No Joint pain?: Yes  Neurological Headaches?: No Dizziness?: No  Psychologic Depression?: Yes Anxiety?: No  Physical Exam: BP (!) 176/74   Pulse 67   Ht 5\' 3"  (1.6 m)   Wt 138 lb (62.6 kg)   BMI 24.45 kg/m   Constitutional: Well nourished. Alert and oriented, No acute distress. HEENT: [place] AT, moist mucus membranes. Trachea midline, no masses. Cardiovascular: No clubbing, cyanosis, or edema. Respiratory: Normal respiratory effort, no increased work of breathing. Skin: No rashes, bruises or suspicious lesions. Lymph: No cervical or inguinal adenopathy. Neurologic: Grossly intact, no focal deficits, moving all 4 extremities. Psychiatric: Normal mood and affect.  Laboratory Data: Lab Results  Component Value Date   WBC 7.3 08/12/2016   HGB 13.1 08/12/2016   HCT 39.8 08/12/2016   MCV 91.3  08/12/2016   PLT 161.0 08/12/2016    Lab Results  Component Value Date   CREATININE 0.80 08/12/2016     Lab Results  Component Value Date   TSH 1.41 08/12/2016       Component Value Date/Time   CHOL 172 08/12/2016 1442   HDL 75.70 08/12/2016 1442   CHOLHDL 2 08/12/2016 1442   VLDL 25.2 08/12/2016 1442   LDLCALC 71 08/12/2016 1442    Lab Results  Component Value Date   AST 19 08/12/2016   Lab Results  Component Value Date   ALT 18 08/12/2016     I have reviewed the labs.  Assessment & Plan:    1. History of recurrent UTI  - Reviewed with preventative strategies   - Reminded the patient to contact our office with symptoms of urinary tract infections   - Reminded patient that if we are unavailable that she should request a catheterized specimen   2. Vaginal atrophy  - Patient will continue the vaginal estrogen cream  - RTC in one year   3. Urge incontinence  - resolved   - RTC in one year for PVR and OAB questionnaire  Return in about 1 year (around 05/11/2018) for PVR and OAB questionnaire.  These notes generated with voice recognition software. I apologize for typographical errors.  Zara Council, [facility] [address] [place], [place] [address] [phone]

## 2017-05-11 ENCOUNTER — Ambulatory Visit (INDEPENDENT_AMBULATORY_CARE_PROVIDER_SITE_OTHER): Payer: PPO | Admitting: Urology

## 2017-05-11 ENCOUNTER — Encounter: Payer: Self-pay | Admitting: Urology

## 2017-05-11 ENCOUNTER — Other Ambulatory Visit: Payer: Self-pay | Admitting: Family Medicine

## 2017-05-11 VITALS — BP 176/74 | HR 67 | Ht 63.0 in | Wt 138.0 lb

## 2017-05-11 DIAGNOSIS — N3941 Urge incontinence: Secondary | ICD-10-CM | POA: Diagnosis not present

## 2017-05-11 DIAGNOSIS — N952 Postmenopausal atrophic vaginitis: Secondary | ICD-10-CM | POA: Diagnosis not present

## 2017-05-11 DIAGNOSIS — Z87440 Personal history of urinary (tract) infections: Secondary | ICD-10-CM

## 2017-05-15 DIAGNOSIS — R0902 Hypoxemia: Secondary | ICD-10-CM | POA: Diagnosis not present

## 2017-05-15 DIAGNOSIS — J309 Allergic rhinitis, unspecified: Secondary | ICD-10-CM | POA: Diagnosis not present

## 2017-06-10 DIAGNOSIS — I10 Essential (primary) hypertension: Secondary | ICD-10-CM | POA: Diagnosis not present

## 2017-06-10 DIAGNOSIS — J449 Chronic obstructive pulmonary disease, unspecified: Secondary | ICD-10-CM | POA: Diagnosis not present

## 2017-06-10 DIAGNOSIS — E785 Hyperlipidemia, unspecified: Secondary | ICD-10-CM | POA: Diagnosis not present

## 2017-06-10 DIAGNOSIS — K219 Gastro-esophageal reflux disease without esophagitis: Secondary | ICD-10-CM | POA: Diagnosis not present

## 2017-06-15 DIAGNOSIS — J309 Allergic rhinitis, unspecified: Secondary | ICD-10-CM | POA: Diagnosis not present

## 2017-06-15 DIAGNOSIS — R0902 Hypoxemia: Secondary | ICD-10-CM | POA: Diagnosis not present

## 2017-06-16 DIAGNOSIS — J449 Chronic obstructive pulmonary disease, unspecified: Secondary | ICD-10-CM | POA: Diagnosis not present

## 2017-07-01 ENCOUNTER — Other Ambulatory Visit: Payer: Self-pay | Admitting: Family Medicine

## 2017-07-14 DIAGNOSIS — J449 Chronic obstructive pulmonary disease, unspecified: Secondary | ICD-10-CM | POA: Diagnosis not present

## 2017-07-16 DIAGNOSIS — R0902 Hypoxemia: Secondary | ICD-10-CM | POA: Diagnosis not present

## 2017-07-16 DIAGNOSIS — J309 Allergic rhinitis, unspecified: Secondary | ICD-10-CM | POA: Diagnosis not present

## 2017-07-25 DIAGNOSIS — D518 Other vitamin B12 deficiency anemias: Secondary | ICD-10-CM | POA: Diagnosis not present

## 2017-07-25 DIAGNOSIS — E559 Vitamin D deficiency, unspecified: Secondary | ICD-10-CM | POA: Diagnosis not present

## 2017-07-25 DIAGNOSIS — E038 Other specified hypothyroidism: Secondary | ICD-10-CM | POA: Diagnosis not present

## 2017-07-25 DIAGNOSIS — E119 Type 2 diabetes mellitus without complications: Secondary | ICD-10-CM | POA: Diagnosis not present

## 2017-07-25 DIAGNOSIS — E7849 Other hyperlipidemia: Secondary | ICD-10-CM | POA: Diagnosis not present

## 2017-07-25 DIAGNOSIS — Z79899 Other long term (current) drug therapy: Secondary | ICD-10-CM | POA: Diagnosis not present

## 2017-08-04 ENCOUNTER — Other Ambulatory Visit: Payer: Self-pay | Admitting: Family Medicine

## 2017-08-05 DIAGNOSIS — E785 Hyperlipidemia, unspecified: Secondary | ICD-10-CM | POA: Diagnosis not present

## 2017-08-05 DIAGNOSIS — I10 Essential (primary) hypertension: Secondary | ICD-10-CM | POA: Diagnosis not present

## 2017-08-05 DIAGNOSIS — K219 Gastro-esophageal reflux disease without esophagitis: Secondary | ICD-10-CM | POA: Diagnosis not present

## 2017-08-05 DIAGNOSIS — J449 Chronic obstructive pulmonary disease, unspecified: Secondary | ICD-10-CM | POA: Diagnosis not present

## 2017-08-13 DIAGNOSIS — R0902 Hypoxemia: Secondary | ICD-10-CM | POA: Diagnosis not present

## 2017-08-13 DIAGNOSIS — J309 Allergic rhinitis, unspecified: Secondary | ICD-10-CM | POA: Diagnosis not present

## 2017-08-26 DIAGNOSIS — J449 Chronic obstructive pulmonary disease, unspecified: Secondary | ICD-10-CM | POA: Diagnosis not present

## 2017-09-02 DIAGNOSIS — J449 Chronic obstructive pulmonary disease, unspecified: Secondary | ICD-10-CM | POA: Diagnosis not present

## 2017-09-02 DIAGNOSIS — E785 Hyperlipidemia, unspecified: Secondary | ICD-10-CM | POA: Diagnosis not present

## 2017-09-02 DIAGNOSIS — K219 Gastro-esophageal reflux disease without esophagitis: Secondary | ICD-10-CM | POA: Diagnosis not present

## 2017-09-03 IMAGING — CR DG ABDOMEN 1V
1 series · 1 of 1 positions shown · non-contrast
Comparison: KUB dated October 19, 2009

CLINICAL DATA: Pelvic pain for the past 3 weeks. Hematuria in the
past but not currently. History of recurrent urinary tract
infections.

EXAM:
ABDOMEN - 1 VIEW

[dg abd 1 view]
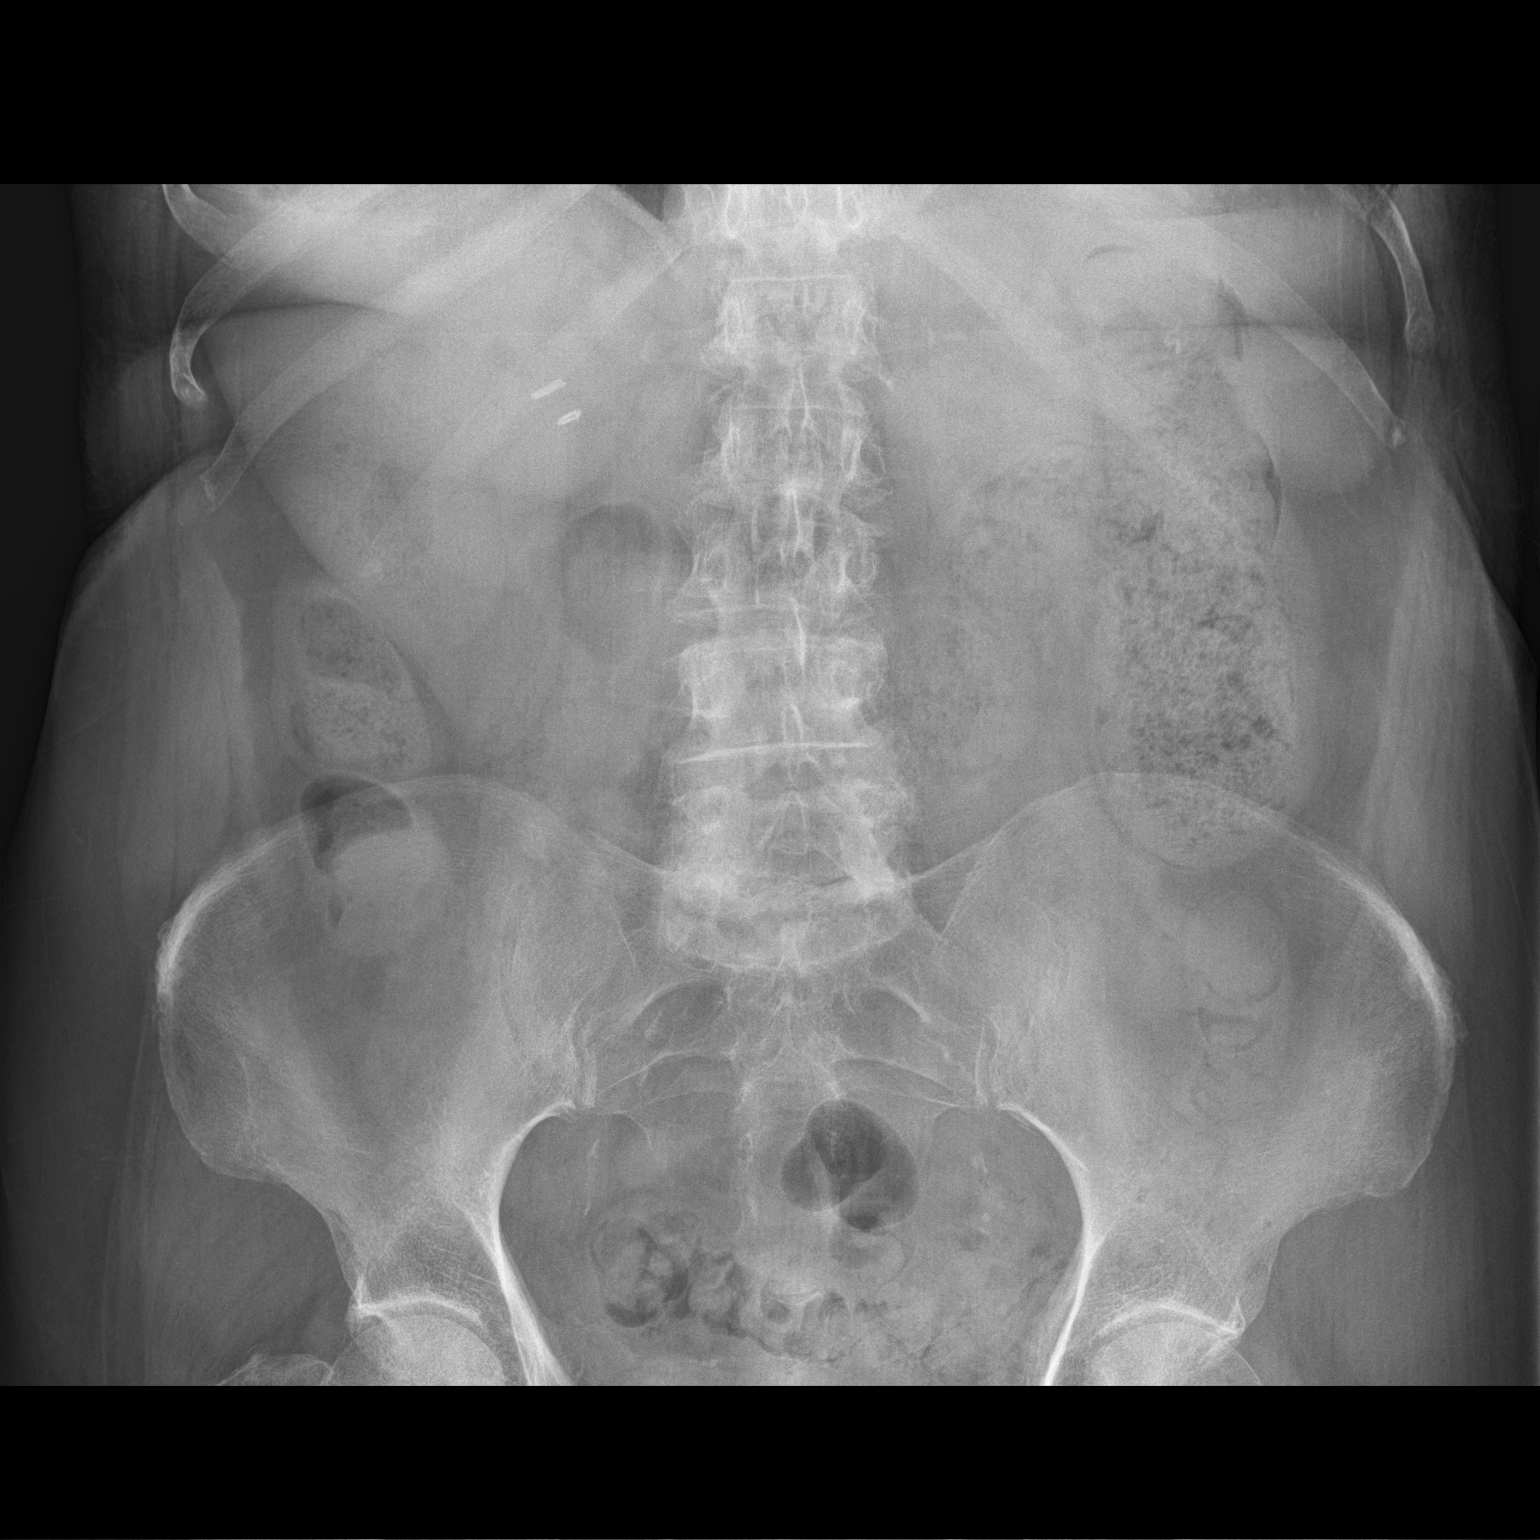

[1 of 1 positions shown; findings below may reference images not displayed]

FINDINGS: The colonic stool burden is mildly increased. No excessive gas is
observed. There is no small or large bowel obstructive pattern.
There is stool and gas in the rectum. No definite stones overlie the
kidneys. There are calcifications in the upper aspect of the true
bony pelvis which likely lie in the iliac arteries. No definite
ureteral stones are observed today. There are surgical clips in the
gallbladder fossa. There is blunting of the right lateral
costophrenic angle. There is degenerative change of the lower lumbar
spine and of the left hip.
IMPRESSION: Moderately increased colonic stool burden may reflect constipation
in the appropriate clinical setting. No definite urinary tract
stones. If the patient's symptoms persist, abdominal and pelvic CT
scanning would be a useful next imaging step.

## 2017-09-13 DIAGNOSIS — J309 Allergic rhinitis, unspecified: Secondary | ICD-10-CM | POA: Diagnosis not present

## 2017-09-13 DIAGNOSIS — R0902 Hypoxemia: Secondary | ICD-10-CM | POA: Diagnosis not present

## 2017-09-16 ENCOUNTER — Other Ambulatory Visit: Payer: Self-pay | Admitting: Family Medicine

## 2017-09-18 IMAGING — US US RENAL
1 series · 14 of 25 positions shown · non-contrast
Comparison: Abdominal and pelvic CT scan October 09, 2009

CLINICAL DATA: Two years of bilateral flank pain.

EXAM:
RENAL / URINARY TRACT ULTRASOUND COMPLETE

[Series 1: us renal · 0.26mm/px · 14 of 47 slices shown]
[im 1/47]
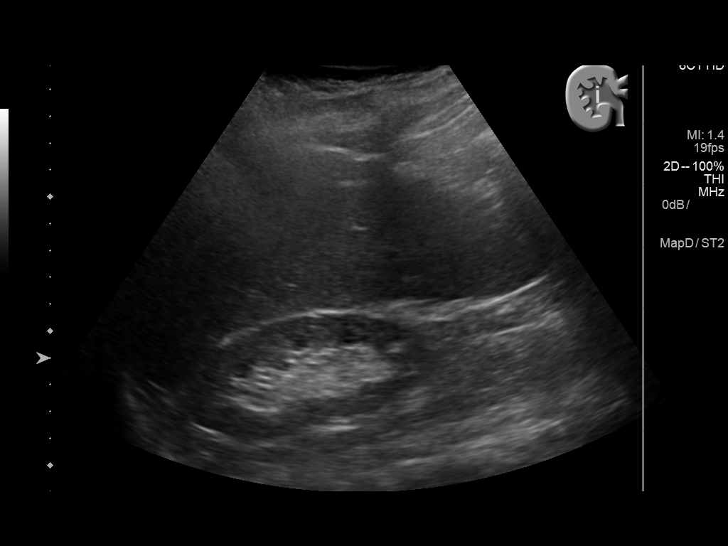
[im 4/47]
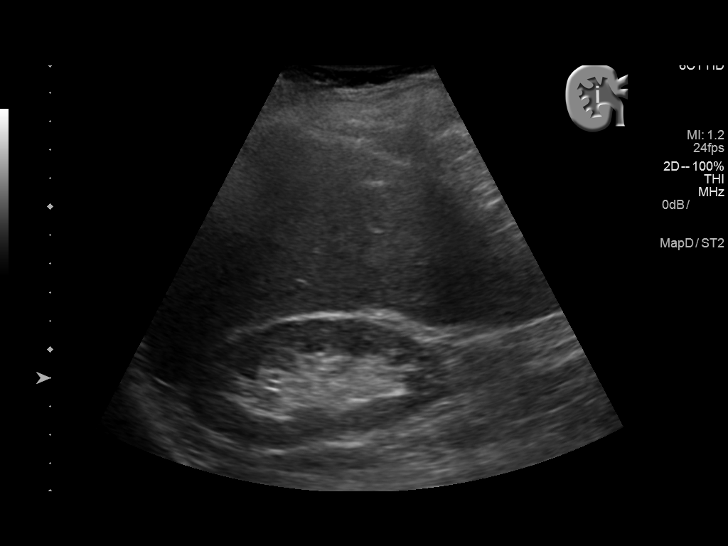
[im 8/47]
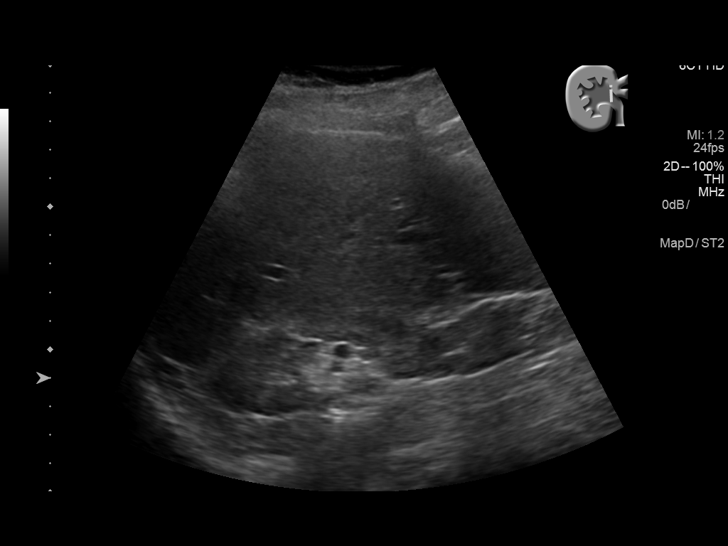
[im 12/47]
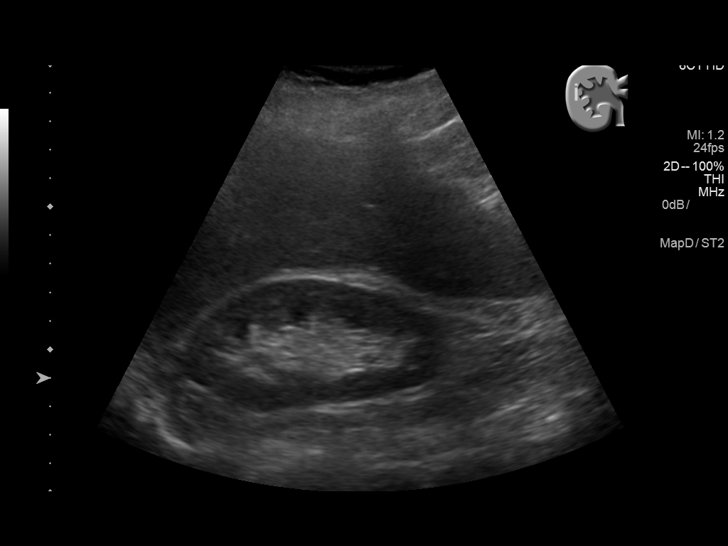
[im 16/47]
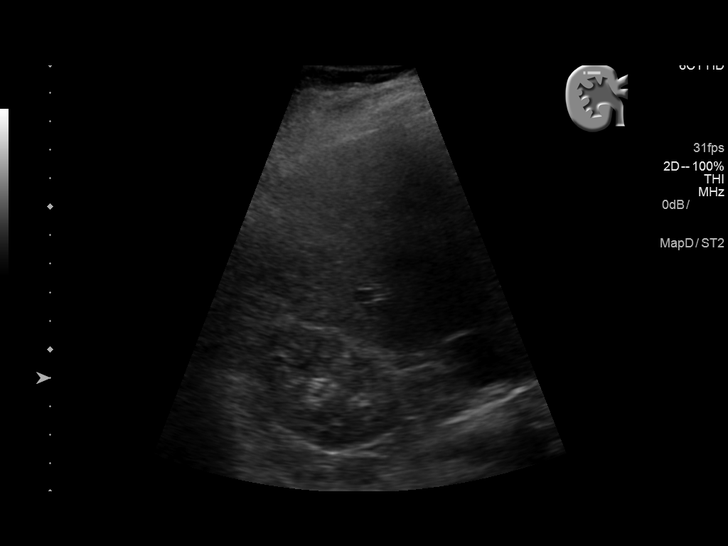
[im 18/47]
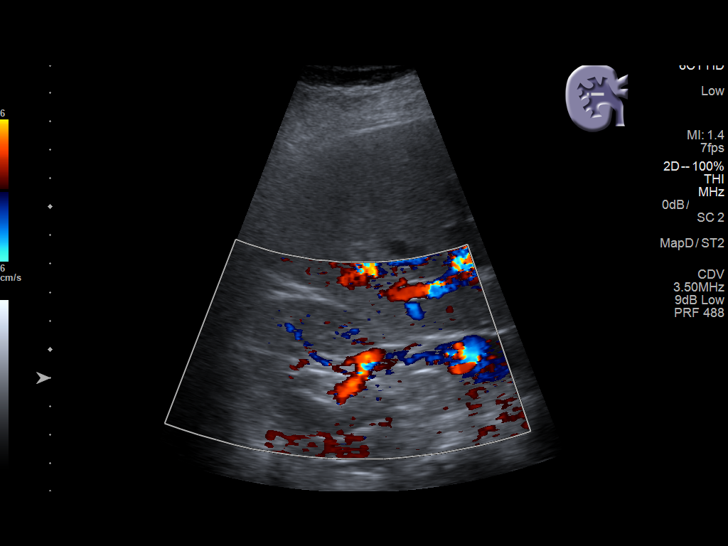
[im 22/47]
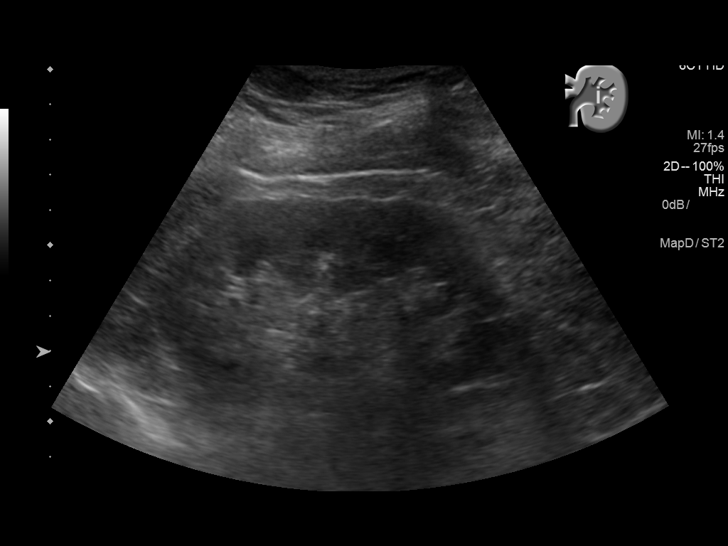
[im 25/47]
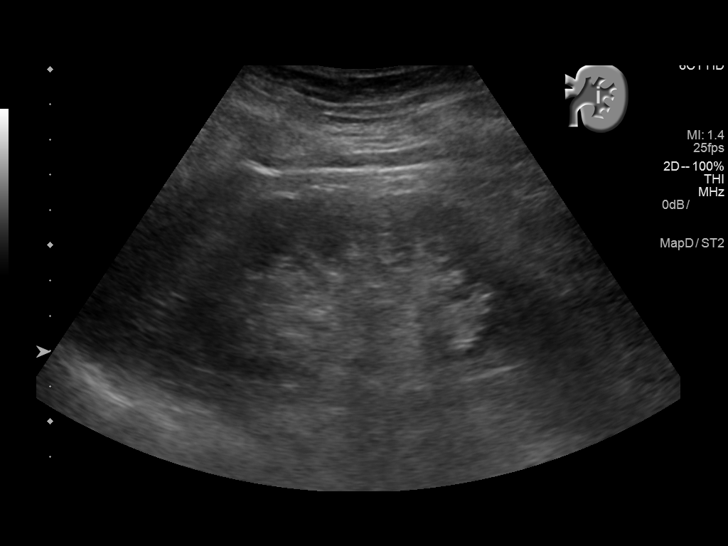
[im 29/47]
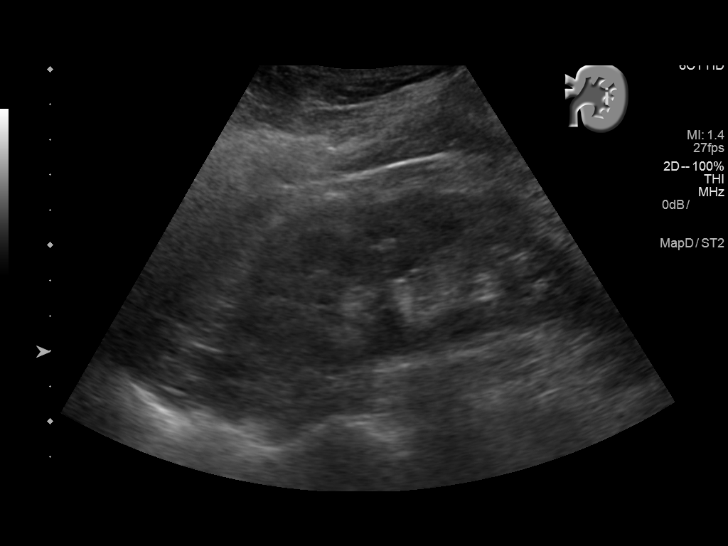
[im 31/47]
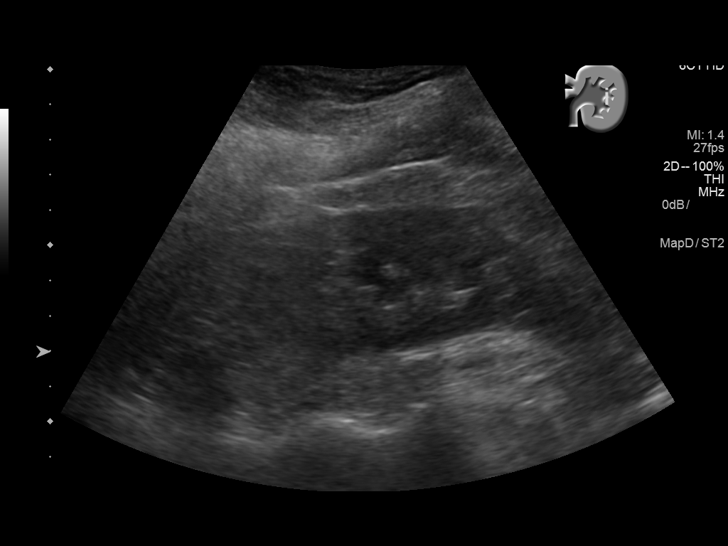
[im 35/47]
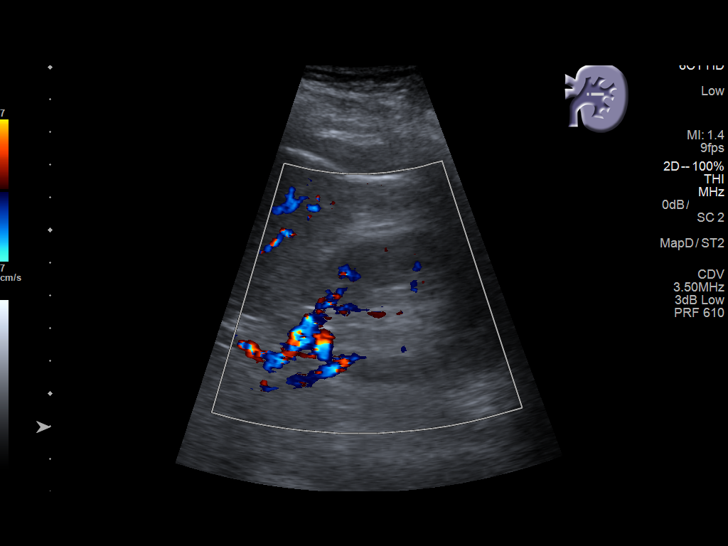
[im 39/47]
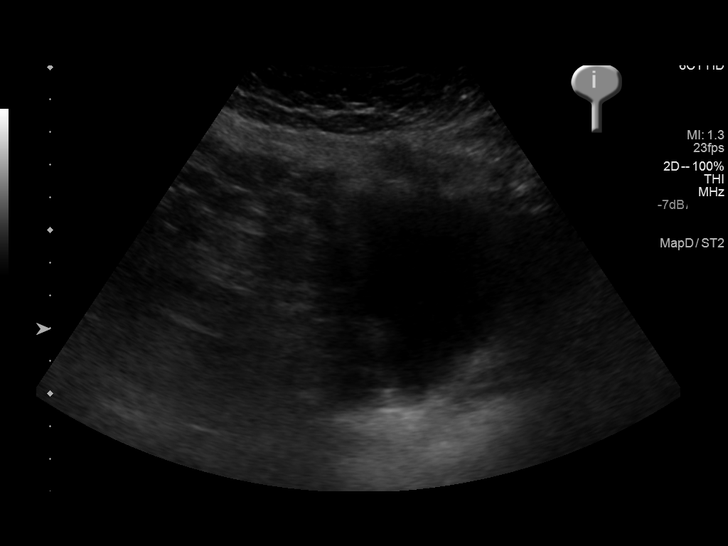
[im 43/47]
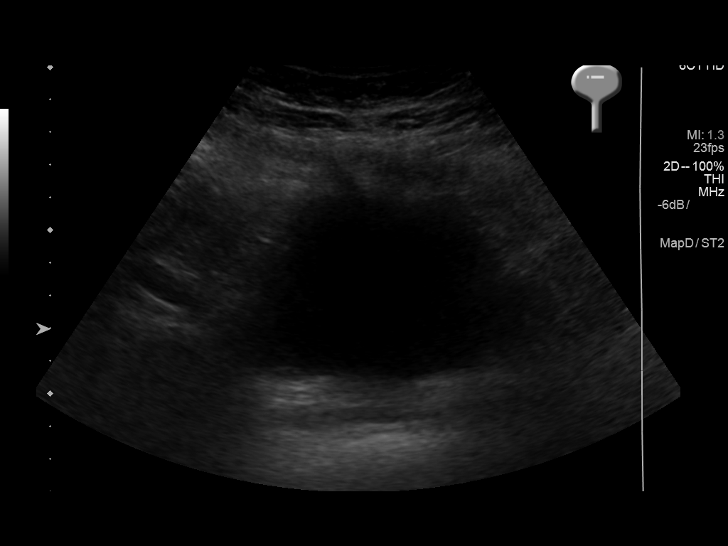
[im 47/47]
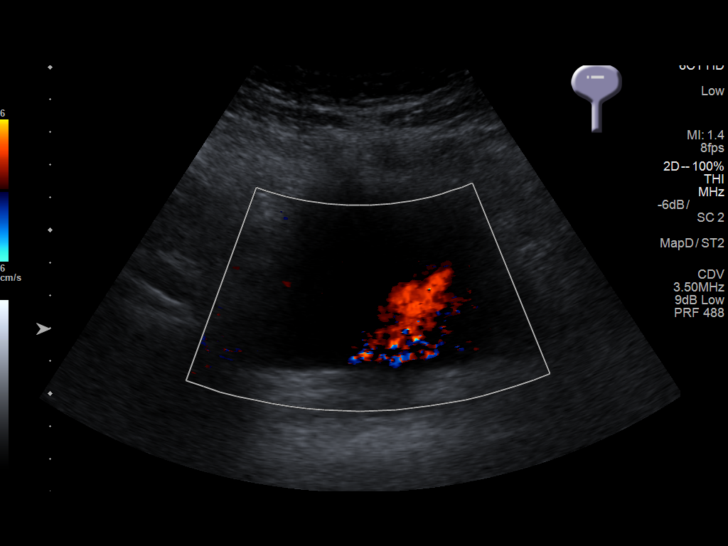

[14 of 25 positions shown; findings below may reference images not displayed]

FINDINGS: Right Kidney:

Length: 9.5 cm. The renal cortical echotexture remains lower than
that of the adjacent liver. There is no hydronephrosis. No stones
are evident.

Left Kidney:

Length: 10.9 cm. The renal cortical echotexture remains lower than
that of the adjacent liver. There is no hydronephrosis. No stones
are observed.

Bladder:

The partially distended urinary bladder is normal. Bilateral
ureteral jets are observed.
IMPRESSION: No hydronephrosis nor other acute abnormality of either kidney.

## 2017-09-21 ENCOUNTER — Telehealth: Payer: Self-pay | Admitting: Pulmonary Disease

## 2017-09-21 NOTE — Telephone Encounter (Signed)
Pt requesting a referral to a pulmonologist in our [place] office, would like BQ's suggestion on who to see. BQ please advise. Thanks.

## 2017-09-22 NOTE — Telephone Encounter (Signed)
Called and spoke to patient. Relayed BQ's message to patient. Patient thanked staff for getting to back to her. Nothing further needed at this time.

## 2017-09-22 NOTE — Telephone Encounter (Signed)
They are all great: [facility], [employer] or [employer]

## 2017-09-26 NOTE — Progress Notes (Signed)
[facility]     Assessment and Plan:  Severe emphysema with chronic hypoxic respiratory failure.  Group B. - Currently she is on nebulized Brovana, Pulmicort and albuterol, however she is not using them regularly. -She continues to have significant dyspnea on exertion, encouraged to use her nebulizers more regularly. -Up-to-date on Prevnar and Pneumovax.  Meds ordered this encounter  Medications  . albuterol (PROVENTIL) (2.5 MG/3ML) 0.083% nebulizer solution    Sig: Take every 4-6 hours as needed for shortness of breath    Dispense:  75 mL    Refill:  5  . arformoterol (BROVANA) 15 MCG/2ML NEBU    Sig: Take 2 mLs (15 mcg total) by nebulization 2 (two) times daily.    Dispense:  120 mL    Refill:  11  . budesonide (PULMICORT) 0.5 MG/2ML nebulizer solution    Sig: USE THE CONTENTS OF ONE VIAL (2ML) BY NEBULIZATION TWICE A DAY.    Dispense:  60 mL    Refill:  2   Return in about 6 months (around 03/30/2018).     Date: 09/27/2017  MRN# [id number] [name] [name] 11/12/1940    [name] [name] is a 77 y.o. old female seen in consultation for chief complaint of:    Chief Complaint  Patient presents with  . COPD    pt switching from Dr. [place] Bells to [place] office:  . Emphysema    pt is on 02 24 hrs per day. Pt DME [employer]; she uses nebs    HPI:   The patient is a 77 year old female with a history of severe COPD, previously seeing Dr. Curt Jews in [place], wanting to transfer her care to our [facility] office which is closer to home for her.  She has been normally maintained on nebulized Brovana, Pulmicort, Spiriva.  She also takes Mucinex twice daily.  She is a former smoker. She is here for routine follow up.  She feels that her breathing has been about stable to slightly worse. She has not had a flare up or hospitalization in the past few years.  She uses 3 nebs but not very regularly. She uses oxygen at 3L, sometimes to 4L with exertion. She  takes it off to walk about her 60 foot driveway.  She drives a car to pharmacy, church, and wears her oxygen at those times. Her children do her groceries. She has helps with vaccuuming, she lives at [facility] and has helps with many ADL's.  She did pulm rehab twice.  She think that she has had a pneumonia vaccine in 2010 and prevnar in 2015.   Imaging personally reviewed 10/30/2014; findings of severe emphysema without significant interstitial lung disease.   PMHX:   Past Medical History:  Diagnosis Date  . Chronic cystitis    recurrent UTI (4+ in last year), on suppressive doxy (uro Cope)  . Colon polyp   . 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 Depression with anxiety   . Diverticular disease 10/09/09   CT abd no sigmoid mass DDD L5/S1  . Dyslipidemia   . GERD (gastroesophageal reflux disease)   . Hematuria    h/o kidney stones  . Hiatal hernia   . Hypertension   . Left scapholunate ligament tear 2014   found on imaging  . Lung cancer ([place]) 1987   Partial RLL Lobectomy.  . Osteoarthritis   . Osteopenia 08/2011   DEXA: L femur -1.6  . Peptic ulcer   . Spinal  abscess ([place]) 2000   secondary to periodontal disease   Surgical Hx:  Past Surgical History:  Procedure Laterality Date  . BLADDER SURGERY    . BREAST BIOPSY Left 03/99   neg  . CHOLECYSTECTOMY  02/10/09  . COLONOSCOPY WITH PROPOFOL N/A 04/30/2014   Procedure: COLONOSCOPY WITH PROPOFOL;  Surgeon: Irene Shipper, MD;  Location: [facility];  Service: Endoscopy;  Laterality: N/A;  . CYSTOSCOPY  02/11/07 and 09/25/08   Dr. Jacqlyn Larsen, normal  . DEXA  08/2011   T femur -1.6, T spine -0.6  . [place] pneumonia sepsis due to gum surgery  . REPLACEMENT TOTAL KNEE  09/2009   Right  . TOTAL ABDOMINAL HYSTERECTOMY  07/18/01   benign reason, BSO, vag repair secondary prolapse  . TUBAL LIGATION     bilateral   Family Hx:  Family History  Problem Relation Age of Onset  . Pancreatitis Mother   . Heart  disease Father   . Hypertension Father   . Emphysema Father   . Arthritis Brother   . Hypertension Brother   . Cancer Maternal Aunt        ovarian cancer  . Kidney disease Neg Hx   . Prostate cancer Neg Hx   . Bladder Cancer Neg Hx    Social Hx:   Social History   Tobacco Use  . Smoking status: Former Smoker    Packs/day: 3.00    Years: 34.00    Pack years: 102.00    Types: Cigarettes    Last attempt to quit: 05/29/1995    Years since quitting: 22.3  . Smokeless tobacco: Never Used  Substance Use Topics  . Alcohol use: Yes    Alcohol/week: 0.0 oz    Comment: wine daily 2-3 glasses/day  . Drug use: No   Medication:    Current Outpatient Medications:  .  albuterol (PROAIR HFA) 108 (90 Base) MCG/ACT inhaler, Inhale 2 puffs into the lungs every 4 (four) hours as needed., Disp: 1 Inhaler, Rfl: 5 .  albuterol (PROVENTIL) (2.5 MG/3ML) 0.083% nebulizer solution, Take every 4-6 hours as needed for shortness of breath, Disp: 75 mL, Rfl: 5 .  arformoterol (BROVANA) 15 MCG/2ML NEBU, Take 2 mLs (15 mcg total) by nebulization 2 (two) times daily., Disp: 120 mL, Rfl: 11 .  aspirin EC 81 MG tablet, Take 81 mg by mouth daily., Disp: , Rfl:  .  Biotin 10 MG CAPS, Take 1 capsule by mouth daily., Disp: , Rfl:  .  budesonide (PULMICORT) 0.5 MG/2ML nebulizer solution, USE THE CONTENTS OF ONE VIAL (2ML) BY NEBULIZATION TWICE A DAY., Disp: 60 mL, Rfl: 2 .  buPROPion (WELLBUTRIN SR) 150 MG 12 hr tablet, TAKE ONE TABLET BY MOUTH TWICE DAILY, Disp: 60 tablet, Rfl: 4 .  Calcium Carb-Cholecalciferol (CALCIUM 600+D3) 600-200 MG-UNIT TABS, Take 1 tablet by mouth daily., Disp: , Rfl:  .  cholecalciferol (VITAMIN D) 1000 units tablet, Take 2,000 Units by mouth daily. , Disp: , Rfl:  .  CRANBERRY PO, Take 2 tablets by mouth every morning. , Disp: , Rfl:  .  estradiol (ESTRACE VAGINAL) 0.1 MG/GM vaginal cream, Apply 0.5mg  (pea-sized amount)  just inside the vaginal introitus with a finger-tip every night for  two weeks and then Monday, Wednesday and Friday nights., Disp: 30 g, Rfl: 12 .  ezetimibe (ZETIA) 10 MG tablet, TAKE ONE TABLET BY MOUTH EVERY DAY, Disp: 90 tablet, Rfl: 0 .  losartan (COZAAR) 25 MG tablet, Take  1 tablet (25 mg total) by mouth daily., Disp: 30 tablet, Rfl: 6 .  metoprolol tartrate (LOPRESSOR) 25 MG tablet, TAKE ONE TABLET BY MOUTH TWICE DAILY, Disp: 60 tablet, Rfl: 5 .  omeprazole (PRILOSEC) 40 MG capsule, TAKE ONE CAPSULE BY MOUTH EVERY DAY, Disp: 30 capsule, Rfl: 5 .  pravastatin (PRAVACHOL) 80 MG tablet, TAKE ONE TABLET BY MOUTH AT BEDTIME, Disp: 90 tablet, Rfl: 0 .  psyllium (METAMUCIL) 58.6 % powder, Take 1 packet by mouth at bedtime. , Disp: , Rfl:  .  sertraline (ZOLOFT) 100 MG tablet, TAKE 1 TABLET EVERY MORNING AND 1/2 TABLET EVERY EVENING, Disp: 45 tablet, Rfl: 6 .  tiotropium (SPIRIVA HANDIHALER) 18 MCG inhalation capsule, INHALE CONTENTS OF 1 CAPSULE ONCE DAILY., Disp: 30 capsule, Rfl: 6   Allergies:  Keflex [cephalexin] and Penicillins  Review of Systems: Gen:  Denies  fever, sweats, chills HEENT: Denies blurred vision, double vision. bleeds, sore throat Cvc:  No dizziness, chest pain. Resp:   Denies cough or sputum production, shortness of breath Gi: Denies swallowing difficulty, stomach pain. Gu:  Denies bladder incontinence, burning urine Ext:   No Joint pain, stiffness. Skin: No skin rash,  hives  Endoc:  No polyuria, polydipsia. Psych: No depression, insomnia. Other:  All other systems were reviewed with the patient and were negative other that what is mentioned in the HPI.   Physical Examination:   VS: BP (!) 148/88 (BP Location: Left Arm, Cuff Size: Normal)   Pulse 74   Resp 16   Ht 5\' 3"  (1.6 m)   Wt 131 lb (59.4 kg)   SpO2 97%   BMI 23.21 kg/m   General Appearance: No distress  Neuro:without focal findings,  speech normal,  HEENT: PERRLA, EOM intact.   Pulmonary: normal breath sounds, No wheezing.  CardiovascularNormal S1,S2.  No m/r/g.    Abdomen: Benign, Soft, non-tender. Renal:  No costovertebral tenderness  GU:  No performed at this time. Endoc: No evident thyromegaly, no signs of acromegaly. Skin:   warm, no rashes, no ecchymosis  Extremities: normal, no cyanosis, clubbing.  Other findings:    LABORATORY PANEL:   CBC No results for input(s): WBC, HGB, HCT, PLT in the last 168 hours. ------------------------------------------------------------------------------------------------------------------  Chemistries  No results for input(s): NA, K, CL, CO2, GLUCOSE, BUN, CREATININE, CALCIUM, MG, AST, ALT, ALKPHOS, BILITOT in the last 168 hours.  Invalid input(s): GFRCGP ------------------------------------------------------------------------------------------------------------------  Cardiac Enzymes No results for input(s): TROPONINI in the last 168 hours. ------------------------------------------------------------  RADIOLOGY:  No results found.     Thank  you for the consultation and for allowing [facility] to assist in the care of your patient. Our recommendations are noted above.  Please contact us if we can be of further service.   Marda Stalker, MD.  Board Certified in Internal Medicine, Pulmonary Medicine, [place], and Sleep Medicine.  [facility] Office Number: [phone]  Patricia Pesa, M.D.  Merton Border, M.D  09/27/2017

## 2017-09-27 ENCOUNTER — Ambulatory Visit: Payer: PPO | Admitting: Internal Medicine

## 2017-09-27 ENCOUNTER — Other Ambulatory Visit: Payer: Self-pay | Admitting: Family Medicine

## 2017-09-27 ENCOUNTER — Encounter: Payer: Self-pay | Admitting: Internal Medicine

## 2017-09-27 VITALS — BP 148/88 | HR 74 | Resp 16 | Ht 63.0 in | Wt 131.0 lb

## 2017-09-27 DIAGNOSIS — J439 Emphysema, unspecified: Secondary | ICD-10-CM | POA: Diagnosis not present

## 2017-09-27 MED ORDER — BUDESONIDE 0.5 MG/2ML IN SUSP: RESPIRATORY_TRACT | 2 refills | Status: DC

## 2017-09-27 MED ORDER — ALBUTEROL SULFATE (2.5 MG/3ML) 0.083% IN NEBU: INHALATION_SOLUTION | RESPIRATORY_TRACT | 5 refills | Status: DC

## 2017-09-27 MED ORDER — ARFORMOTEROL TARTRATE 15 MCG/2ML IN NEBU: 15.0000 ug | INHALATION_SOLUTION | Freq: Two times a day (BID) | RESPIRATORY_TRACT | 11 refills | Status: DC

## 2017-09-27 NOTE — Patient Instructions (Signed)
Try to remember to use your nebulizers twice daily.

## 2017-09-30 DIAGNOSIS — J449 Chronic obstructive pulmonary disease, unspecified: Secondary | ICD-10-CM | POA: Diagnosis not present

## 2017-09-30 DIAGNOSIS — K219 Gastro-esophageal reflux disease without esophagitis: Secondary | ICD-10-CM | POA: Diagnosis not present

## 2017-09-30 DIAGNOSIS — I10 Essential (primary) hypertension: Secondary | ICD-10-CM | POA: Diagnosis not present

## 2017-09-30 DIAGNOSIS — E785 Hyperlipidemia, unspecified: Secondary | ICD-10-CM | POA: Diagnosis not present

## 2017-10-28 DIAGNOSIS — K219 Gastro-esophageal reflux disease without esophagitis: Secondary | ICD-10-CM | POA: Diagnosis not present

## 2017-10-28 DIAGNOSIS — J449 Chronic obstructive pulmonary disease, unspecified: Secondary | ICD-10-CM | POA: Diagnosis not present

## 2017-10-28 DIAGNOSIS — I10 Essential (primary) hypertension: Secondary | ICD-10-CM | POA: Diagnosis not present

## 2017-10-28 DIAGNOSIS — E785 Hyperlipidemia, unspecified: Secondary | ICD-10-CM | POA: Diagnosis not present

## 2017-11-05 ENCOUNTER — Other Ambulatory Visit: Payer: Self-pay | Admitting: Family Medicine

## 2017-11-09 ENCOUNTER — Other Ambulatory Visit: Payer: Self-pay | Admitting: Family Medicine

## 2017-11-09 DIAGNOSIS — E7849 Other hyperlipidemia: Secondary | ICD-10-CM | POA: Diagnosis not present

## 2017-11-09 DIAGNOSIS — E559 Vitamin D deficiency, unspecified: Secondary | ICD-10-CM | POA: Diagnosis not present

## 2017-11-09 DIAGNOSIS — D518 Other vitamin B12 deficiency anemias: Secondary | ICD-10-CM | POA: Diagnosis not present

## 2017-11-09 DIAGNOSIS — Z79899 Other long term (current) drug therapy: Secondary | ICD-10-CM | POA: Diagnosis not present

## 2017-11-09 DIAGNOSIS — E038 Other specified hypothyroidism: Secondary | ICD-10-CM | POA: Diagnosis not present

## 2017-11-09 DIAGNOSIS — E119 Type 2 diabetes mellitus without complications: Secondary | ICD-10-CM | POA: Diagnosis not present

## 2017-11-10 ENCOUNTER — Other Ambulatory Visit: Payer: Self-pay | Admitting: Family Medicine

## 2017-11-13 DIAGNOSIS — R0902 Hypoxemia: Secondary | ICD-10-CM | POA: Diagnosis not present

## 2017-11-13 DIAGNOSIS — J309 Allergic rhinitis, unspecified: Secondary | ICD-10-CM | POA: Diagnosis not present

## 2017-11-17 ENCOUNTER — Telehealth: Payer: Self-pay

## 2017-11-17 DIAGNOSIS — R1310 Dysphagia, unspecified: Secondary | ICD-10-CM | POA: Insufficient documentation

## 2017-11-17 NOTE — Telephone Encounter (Signed)
Elmyra Ricks with [facility] stopped by the office in regards to the patient. Elmyra Ricks states that patient is at [facility] independent side and patient is been having an issue with choking while trying to eat 2 times recently. They are wanting to start Speech therapy for the patient ASAP to help with this issue. She dropped off a form to be signed. I advised Elmyra Ricks that patient has not seen Dr Darnell Level since May 2018. Elmyra Ricks stated if the form is signed off and filled out for all the parts except an encounter face to face line they would make sure that patient gets in here ASAP for an office visit. Home Health has to have an office note within 30 days of the start of the service for the patient. I advised Elmyra Ricks that I would send the form for review and patient may need to be seen before form can be filled out. Nicole's call back number is [phone]. If form is filled out please fax it to the number on the form. Thank Edrick Kins, RMA Form placed for Dr Darnell Level review, in-basket

## 2017-11-17 NOTE — Telephone Encounter (Signed)
Faxed form.    Spoke with Elmyra Ricks with [facility] notifying her the form was faxed.  Also, mentioned the pt needs and OV with Dr. Darnell Level.  Says she understands and will have pt call and schedule.  Expresses her thanks for all the help.

## 2017-11-17 NOTE — Telephone Encounter (Signed)
Filled and in Lisa's box.  Pt will need to come in for office visit.

## 2017-11-18 DIAGNOSIS — J449 Chronic obstructive pulmonary disease, unspecified: Secondary | ICD-10-CM | POA: Diagnosis not present

## 2017-11-18 NOTE — Telephone Encounter (Signed)
Pt scheduled on 02/01/18 at 12:30 PM.

## 2017-11-25 DIAGNOSIS — J449 Chronic obstructive pulmonary disease, unspecified: Secondary | ICD-10-CM | POA: Diagnosis not present

## 2017-11-25 DIAGNOSIS — I10 Essential (primary) hypertension: Secondary | ICD-10-CM | POA: Diagnosis not present

## 2017-11-25 DIAGNOSIS — E785 Hyperlipidemia, unspecified: Secondary | ICD-10-CM | POA: Diagnosis not present

## 2017-11-25 DIAGNOSIS — K219 Gastro-esophageal reflux disease without esophagitis: Secondary | ICD-10-CM | POA: Diagnosis not present

## 2017-12-01 DIAGNOSIS — R1310 Dysphagia, unspecified: Secondary | ICD-10-CM | POA: Diagnosis not present

## 2017-12-01 DIAGNOSIS — Z85118 Personal history of other malignant neoplasm of bronchus and lung: Secondary | ICD-10-CM | POA: Diagnosis not present

## 2017-12-01 DIAGNOSIS — I10 Essential (primary) hypertension: Secondary | ICD-10-CM | POA: Diagnosis not present

## 2017-12-01 DIAGNOSIS — Z9981 Dependence on supplemental oxygen: Secondary | ICD-10-CM | POA: Diagnosis not present

## 2017-12-01 DIAGNOSIS — K219 Gastro-esophageal reflux disease without esophagitis: Secondary | ICD-10-CM | POA: Diagnosis not present

## 2017-12-01 DIAGNOSIS — F329 Major depressive disorder, single episode, unspecified: Secondary | ICD-10-CM | POA: Diagnosis not present

## 2017-12-01 DIAGNOSIS — Z87891 Personal history of nicotine dependence: Secondary | ICD-10-CM | POA: Diagnosis not present

## 2017-12-01 DIAGNOSIS — J449 Chronic obstructive pulmonary disease, unspecified: Secondary | ICD-10-CM | POA: Diagnosis not present

## 2017-12-13 DIAGNOSIS — J309 Allergic rhinitis, unspecified: Secondary | ICD-10-CM | POA: Diagnosis not present

## 2017-12-13 DIAGNOSIS — J449 Chronic obstructive pulmonary disease, unspecified: Secondary | ICD-10-CM | POA: Diagnosis not present

## 2017-12-13 DIAGNOSIS — R0902 Hypoxemia: Secondary | ICD-10-CM | POA: Diagnosis not present

## 2017-12-22 DIAGNOSIS — Z87891 Personal history of nicotine dependence: Secondary | ICD-10-CM

## 2017-12-22 DIAGNOSIS — J449 Chronic obstructive pulmonary disease, unspecified: Secondary | ICD-10-CM | POA: Diagnosis not present

## 2017-12-22 DIAGNOSIS — K219 Gastro-esophageal reflux disease without esophagitis: Secondary | ICD-10-CM | POA: Diagnosis not present

## 2017-12-22 DIAGNOSIS — I10 Essential (primary) hypertension: Secondary | ICD-10-CM | POA: Diagnosis not present

## 2017-12-22 DIAGNOSIS — F329 Major depressive disorder, single episode, unspecified: Secondary | ICD-10-CM | POA: Diagnosis not present

## 2017-12-22 DIAGNOSIS — R1310 Dysphagia, unspecified: Secondary | ICD-10-CM | POA: Diagnosis not present

## 2017-12-22 DIAGNOSIS — Z85118 Personal history of other malignant neoplasm of bronchus and lung: Secondary | ICD-10-CM | POA: Diagnosis not present

## 2017-12-22 DIAGNOSIS — Z9981 Dependence on supplemental oxygen: Secondary | ICD-10-CM | POA: Diagnosis not present

## 2017-12-22 NOTE — Telephone Encounter (Signed)
Noted  

## 2017-12-22 NOTE — Telephone Encounter (Addendum)
Home health service started 12/01/2017. Pt has not scheduled appt. I believe she needs within 30 days to get [facility] covered? Please schedule.

## 2017-12-22 NOTE — Telephone Encounter (Signed)
Appointment 12/29/17 Pt aware

## 2017-12-23 DIAGNOSIS — K219 Gastro-esophageal reflux disease without esophagitis: Secondary | ICD-10-CM | POA: Diagnosis not present

## 2017-12-23 DIAGNOSIS — J309 Allergic rhinitis, unspecified: Secondary | ICD-10-CM | POA: Diagnosis not present

## 2017-12-23 DIAGNOSIS — J449 Chronic obstructive pulmonary disease, unspecified: Secondary | ICD-10-CM | POA: Diagnosis not present

## 2017-12-29 ENCOUNTER — Ambulatory Visit: Payer: PPO | Admitting: Family Medicine

## 2017-12-29 ENCOUNTER — Encounter: Payer: Self-pay | Admitting: Family Medicine

## 2017-12-29 VITALS — BP 156/88 | HR 73 | Temp 97.6°F | Ht 63.0 in | Wt 123.5 lb

## 2017-12-29 DIAGNOSIS — R1310 Dysphagia, unspecified: Secondary | ICD-10-CM

## 2017-12-29 DIAGNOSIS — I10 Essential (primary) hypertension: Secondary | ICD-10-CM | POA: Diagnosis not present

## 2017-12-29 DIAGNOSIS — J9611 Chronic respiratory failure with hypoxia: Secondary | ICD-10-CM

## 2017-12-29 DIAGNOSIS — F331 Major depressive disorder, recurrent, moderate: Secondary | ICD-10-CM

## 2017-12-29 DIAGNOSIS — E785 Hyperlipidemia, unspecified: Secondary | ICD-10-CM | POA: Diagnosis not present

## 2017-12-29 DIAGNOSIS — J439 Emphysema, unspecified: Secondary | ICD-10-CM

## 2017-12-29 DIAGNOSIS — R490 Dysphonia: Secondary | ICD-10-CM | POA: Diagnosis not present

## 2017-12-29 DIAGNOSIS — R634 Abnormal weight loss: Secondary | ICD-10-CM

## 2017-12-29 LAB — CBC WITH DIFFERENTIAL/PLATELET
BASOS PCT: 0.5 % (ref 0.0–3.0)
Basophils Absolute: 0 10*3/uL (ref 0.0–0.1)
EOS PCT: 2.6 % (ref 0.0–5.0)
Eosinophils Absolute: 0.1 10*3/uL (ref 0.0–0.7)
HCT: 40.4 % (ref 36.0–46.0)
Hemoglobin: 13.4 g/dL (ref 12.0–15.0)
LYMPHS ABS: 1.3 10*3/uL (ref 0.7–4.0)
Lymphocytes Relative: 23.6 % (ref 12.0–46.0)
MCHC: 33.3 g/dL (ref 30.0–36.0)
MCV: 93.1 fl (ref 78.0–100.0)
MONO ABS: 0.4 10*3/uL (ref 0.1–1.0)
Monocytes Relative: 7.7 % (ref 3.0–12.0)
NEUTROS PCT: 65.6 % (ref 43.0–77.0)
Neutro Abs: 3.6 10*3/uL (ref 1.4–7.7)
PLATELETS: 163 10*3/uL (ref 150.0–400.0)
RBC: 4.34 Mil/uL (ref 3.87–5.11)
RDW: 13 % (ref 11.5–15.5)
WBC: 5.5 10*3/uL (ref 4.0–10.5)

## 2017-12-29 LAB — COMPREHENSIVE METABOLIC PANEL
ALBUMIN: 4.3 g/dL (ref 3.5–5.2)
ALT: 11 U/L (ref 0–35)
AST: 16 U/L (ref 0–37)
Alkaline Phosphatase: 38 U/L — ABNORMAL LOW (ref 39–117)
BUN: 10 mg/dL (ref 6–23)
CHLORIDE: 98 meq/L (ref 96–112)
CO2: 36 meq/L — AB (ref 19–32)
CREATININE: 0.76 mg/dL (ref 0.40–1.20)
Calcium: 9.8 mg/dL (ref 8.4–10.5)
GFR: 78.48 mL/min (ref >60.00)
Glucose, Bld: 110 mg/dL — ABNORMAL HIGH (ref 70–99)
Potassium: 4.3 mEq/L (ref 3.5–5.1)
Sodium: 140 mEq/L (ref 135–145)
Total Bilirubin: 0.5 mg/dL (ref 0.2–1.2)
Total Protein: 7.1 g/dL (ref 6.0–8.3)

## 2017-12-29 LAB — LIPID PANEL
CHOLESTEROL: 153 mg/dL (ref 0–200)
HDL: 70.9 mg/dL (ref >39.00)
LDL CALC: 64 mg/dL (ref 0–99)
NonHDL: 82.42
Total CHOL/HDL Ratio: 2
Triglycerides: 93 mg/dL (ref 0.0–149.0)
VLDL: 18.6 mg/dL (ref 0.0–40.0)

## 2017-12-29 LAB — TSH: TSH: 1.2 u[IU]/mL (ref 0.35–4.50)

## 2017-12-29 MED ORDER — BUPROPION HCL ER (SR) 150 MG PO TB12
150.0000 mg | ORAL_TABLET | Freq: Two times a day (BID) | ORAL | 1 refills | Status: DC
Start: 2017-12-29 — End: 2018-07-04

## 2017-12-29 MED ORDER — LOSARTAN POTASSIUM 25 MG PO TABS: 25.0000 mg | ORAL_TABLET | Freq: Every day | ORAL | 1 refills | Status: DC

## 2017-12-29 MED ORDER — METOPROLOL TARTRATE 25 MG PO TABS: 25.0000 mg | ORAL_TABLET | Freq: Two times a day (BID) | ORAL | 1 refills | Status: DC

## 2017-12-29 MED ORDER — EZETIMIBE 10 MG PO TABS: 10.0000 mg | ORAL_TABLET | Freq: Every day | ORAL | 1 refills | Status: DC

## 2017-12-29 MED ORDER — PRAVASTATIN SODIUM 80 MG PO TABS: 80.0000 mg | ORAL_TABLET | Freq: Every day | ORAL | 1 refills | Status: DC

## 2017-12-29 MED ORDER — TIOTROPIUM BROMIDE MONOHYDRATE 18 MCG IN CAPS: ORAL_CAPSULE | RESPIRATORY_TRACT | 6 refills | Status: DC

## 2017-12-29 NOTE — Assessment & Plan Note (Signed)
Deteriorated, she has not been taking medication - ran out. Refilled today.

## 2017-12-29 NOTE — Assessment & Plan Note (Signed)
Continue pravastatin, zetia. She has stopped aspirin.

## 2017-12-29 NOTE — Progress Notes (Signed)
BP (!) 156/88 (BP Location: Right Arm, Patient Position: Sitting, Cuff Size: Normal)   Pulse 73   Temp 97.6 F (36.4 C) (Oral)   Ht 5\' 3"  (1.6 m)   Wt 123 lb 8 oz (56 kg)   SpO2 96% Comment: 3 L, pulsating  BMI 21.88 kg/m    CC: eval for home health - dysphagia Subjective:    [name], female    DOB: 1940/12/22, 77 y.o.   MRN: [id number]  HPI: [name] [name] is a 77 y.o. female presenting on 12/29/2017 for Follow-up (Here for [facility] f/u. Pt provided summary of [place] visit.)   Last seen 06-Oct-2016. Husband passed away last year.  Lives at [facility] - likes this.   Dysphagia - recent choking while trying to eat during lunch. [place] requested ST - services started 12/01/2017. Benign evaluation. Working on [employer] No choking since speech therapy started. Denies true dysphagia. Endorses persistent hoarseness over the past year.   Denies fevers/chills, nausea/ vomiting, abdominal pain, chest pain, cough, dyspnea, malaise, fatigue, or swollen glands. Endorses light night sweats  Weight loss - unclear why. 30 lb weight loss in the past year. Decreased appetite since she lost her husband.   BP elevated today - has not been taking her antihypertensive medications (losartan and metoprolol.  [place] closed.   Has not been taking aspirin - no CAD or CVA hx  She has not been taking omeprazole - no significant GERD off PPI. Fasting today.   Colonoscopy UTD S/p hysterectomy and BSO for benign reason.  Due for mammogram.   Relevant past medical, surgical, family and social history reviewed and updated as indicated. Interim medical history since our last visit reviewed. Allergies and medications reviewed and updated. Outpatient Medications Prior to Visit  Medication Sig Dispense Refill  . albuterol (PROAIR HFA) 108 (90 Base) MCG/ACT inhaler Inhale 2 puffs into the lungs every 4 (four) hours as needed. 1 Inhaler 5  . albuterol (PROVENTIL) (2.5 MG/3ML) 0.083%  nebulizer solution Take every 4-6 hours as needed for shortness of breath 75 mL 5  . arformoterol (BROVANA) 15 MCG/2ML NEBU Take 2 mLs (15 mcg total) by nebulization 2 (two) times daily. 120 mL 11  . Biotin 10 MG CAPS Take 1 capsule by mouth daily.    . budesonide (PULMICORT) 0.5 MG/2ML nebulizer solution USE THE CONTENTS OF ONE VIAL (2ML) BY NEBULIZATION TWICE A DAY. 60 mL 2  . Calcium Carb-Cholecalciferol (CALCIUM 600+D3) 600-200 MG-UNIT TABS Take 1 tablet by mouth daily.    . cholecalciferol (VITAMIN D) 1000 units tablet Take 2,000 Units by mouth daily.     Marland Kitchen CRANBERRY PO Take 2 tablets by mouth every morning.     Marland Kitchen estradiol (ESTRACE VAGINAL) 0.1 MG/GM vaginal cream Apply 0.5mg  (pea-sized amount)  just inside the vaginal introitus with a finger-tip every night for two weeks and then Monday, Wednesday and Friday nights. 30 g 12  . psyllium (METAMUCIL) 58.6 % powder Take 1 packet by mouth at bedtime.     . sertraline (ZOLOFT) 100 MG tablet TAKE 1 TABLET EVERY MORNING AND 1/2 TABLET EVERY EVENING 45 tablet 6  . aspirin EC 81 MG tablet Take 81 mg by mouth daily.    Marland Kitchen buPROPion (WELLBUTRIN SR) 150 MG 12 hr tablet TAKE ONE TABLET BY MOUTH TWICE DAILY 60 tablet 1  . ezetimibe (ZETIA) 10 MG tablet TAKE ONE TABLET BY MOUTH EVERY DAY 90 tablet 0  . losartan (COZAAR) 25  MG tablet Take 1 tablet (25 mg total) by mouth daily. 30 tablet 6  . metoprolol tartrate (LOPRESSOR) 25 MG tablet TAKE ONE TABLET BY MOUTH TWICE DAILY 60 tablet 5  . omeprazole (PRILOSEC) 40 MG capsule TAKE ONE CAPSULE BY MOUTH EVERY DAY 30 capsule 1  . pravastatin (PRAVACHOL) 80 MG tablet TAKE ONE TABLET BY MOUTH AT BEDTIME 90 tablet 0  . tiotropium (SPIRIVA HANDIHALER) 18 MCG inhalation capsule INHALE THE CONTENTS OF 1 CAPSULE ONE DAILY *NOT FOR ORAL USE*. Needs office visit 30 capsule 0   No facility-administered medications prior to visit.      Per HPI unless specifically indicated in ROS section below Review of Systems       Objective:    BP (!) 156/88 (BP Location: Right Arm, Patient Position: Sitting, Cuff Size: Normal)   Pulse 73   Temp 97.6 F (36.4 C) (Oral)   Ht 5\' 3"  (1.6 m)   Wt 123 lb 8 oz (56 kg)   SpO2 96% Comment: 3 L, pulsating  BMI 21.88 kg/m   Wt Readings from Last 3 Encounters:  12/29/17 123 lb 8 oz (56 kg)  09/27/17 131 lb (59.4 kg)  05/11/17 138 lb (62.6 kg)    Physical Exam  Constitutional: She appears well-developed and well-nourished. No distress.  HENT:  Mouth/Throat: Oropharynx is clear and moist. No oropharyngeal exudate.  Neck: Normal range of motion. Neck supple. No thyromegaly present.  Cardiovascular: Normal rate, regular rhythm and normal heart sounds.  No murmur heard. Pulmonary/Chest: Effort normal and breath sounds normal. No respiratory distress. She has no wheezes. She has no rales.  Abdominal: Soft. Bowel sounds are normal. She exhibits no distension and no mass. There is no tenderness. There is no rebound and no guarding. No hernia.  Musculoskeletal: She exhibits no edema.  Lymphadenopathy:    She has no cervical adenopathy.  Skin: No rash noted.  Psychiatric: She has a normal mood and affect.  Nursing note and vitals reviewed.     Assessment & Plan:   Problem List Items Addressed This Visit    Weight loss, unintentional - Primary    Overall benign exam. Check labs today. See below.       Relevant Orders   Comprehensive metabolic panel   TSH   CBC with Differential/Platelet   Ambulatory referral to ENT   MDD (major depressive disorder), recurrent episode, moderate ([place])    Struggle after husband's death - continue current regimen of wellbutrin and sertraline, refilled today.       Relevant Medications   buPROPion (WELLBUTRIN SR) 150 MG 12 hr tablet   Hyperlipidemia, unspecified   Relevant Medications   losartan (COZAAR) 25 MG tablet   metoprolol tartrate (LOPRESSOR) 25 MG tablet   pravastatin (PRAVACHOL) 80 MG tablet   ezetimibe (ZETIA) 10 MG  tablet   Other Relevant Orders   Lipid panel   Hoarseness, persistent    Persistent - she had stopped PPI due to no GERD sxs. Discussed this may be hidden reflux and may need to restart PPI. However, with noted weight loss, prudent for ENT evaluation of hoarseness. Pt agrees with plan. Referral placed today.       Relevant Orders   Ambulatory referral to ENT   GOLD 4 COPD with emphysema    Stable respiratory period on current regimen. Continue O2 at pulse of 3L       Relevant Medications   tiotropium (SPIRIVA HANDIHALER) 18 MCG inhalation capsule   Essential hypertension  Deteriorated, she has not been taking medication - ran out. Refilled today.       Relevant Medications   losartan (COZAAR) 25 MG tablet   metoprolol tartrate (LOPRESSOR) 25 MG tablet   pravastatin (PRAVACHOL) 80 MG tablet   ezetimibe (ZETIA) 10 MG tablet   Dysphagia    Not true dysphagia, rather isolated choking episodes that have improved after speech therapy coaching. See above.       Dyslipidemia    Continue pravastatin, zetia. She has stopped aspirin.      Relevant Medications   pravastatin (PRAVACHOL) 80 MG tablet   ezetimibe (ZETIA) 10 MG tablet   Chronic respiratory failure with hypoxia (HCC)       Meds ordered this encounter  Medications  . buPROPion (WELLBUTRIN SR) 150 MG 12 hr tablet    Sig: Take 1 tablet (150 mg total) by mouth 2 (two) times daily.    Dispense:  180 tablet    Refill:  1  . losartan (COZAAR) 25 MG tablet    Sig: Take 1 tablet (25 mg total) by mouth daily.    Dispense:  90 tablet    Refill:  1  . metoprolol tartrate (LOPRESSOR) 25 MG tablet    Sig: Take 1 tablet (25 mg total) by mouth 2 (two) times daily.    Dispense:  180 tablet    Refill:  1  . pravastatin (PRAVACHOL) 80 MG tablet    Sig: Take 1 tablet (80 mg total) by mouth at bedtime.    Dispense:  90 tablet    Refill:  1  . ezetimibe (ZETIA) 10 MG tablet    Sig: Take 1 tablet (10 mg total) by mouth daily.     Dispense:  90 tablet    Refill:  1  . tiotropium (SPIRIVA HANDIHALER) 18 MCG inhalation capsule    Sig: INHALE THE CONTENTS OF 1 CAPSULE ONE DAILY *NOT FOR ORAL USE*.    Dispense:  30 capsule    Refill:  6   Orders Placed This Encounter  Procedures  . Lipid panel  . Comprehensive metabolic panel  . TSH  . CBC with Differential/Platelet  . Ambulatory referral to ENT    Referral Priority:   Routine    Referral Type:   Consultation    Referral Reason:   Specialty Services Required    Requested Specialty:   Otolaryngology    Number of Visits Requested:   1    Follow up plan: Return in about 3 months (around 03/31/2018) for annual exam, prior fasting for blood work, medicare wellness visit.  Ria Bush, MD

## 2017-12-29 NOTE — Assessment & Plan Note (Signed)
Struggle after husband's death - continue current regimen of wellbutrin and sertraline, refilled today.

## 2017-12-29 NOTE — Assessment & Plan Note (Signed)
Persistent - she had stopped PPI due to no GERD sxs. Discussed this may be hidden reflux and may need to restart PPI. However, with noted weight loss, prudent for ENT evaluation of hoarseness. Pt agrees with plan. Referral placed today.

## 2017-12-29 NOTE — Assessment & Plan Note (Signed)
Not true dysphagia, rather isolated choking episodes that have improved after speech therapy coaching. See above.

## 2017-12-29 NOTE — Assessment & Plan Note (Signed)
Stable respiratory period on current regimen. Continue O2 at pulse of 3L

## 2017-12-29 NOTE — Patient Instructions (Addendum)
Medicines refilled today.  Call and schedule mammogram.  Labs today.  Return in 3 months for medicare wellness and physical.  We will refer you to ENT for persistent hoarseness.

## 2017-12-29 NOTE — Assessment & Plan Note (Signed)
Overall benign exam. Check labs today. See below.

## 2018-01-02 ENCOUNTER — Other Ambulatory Visit: Payer: Self-pay | Admitting: Family Medicine

## 2018-01-02 ENCOUNTER — Ambulatory Visit: Payer: PPO | Admitting: Family Medicine

## 2018-01-02 DIAGNOSIS — Z1231 Encounter for screening mammogram for malignant neoplasm of breast: Secondary | ICD-10-CM

## 2018-01-03 ENCOUNTER — Other Ambulatory Visit: Payer: Self-pay | Admitting: Family Medicine

## 2018-01-03 NOTE — Telephone Encounter (Signed)
Electronic refill request Last office visit 12/29/17 (See note) Medication is no longer on the med sheet.

## 2018-01-12 DIAGNOSIS — K219 Gastro-esophageal reflux disease without esophagitis: Secondary | ICD-10-CM | POA: Diagnosis not present

## 2018-01-12 DIAGNOSIS — R490 Dysphonia: Secondary | ICD-10-CM | POA: Diagnosis not present

## 2018-01-13 DIAGNOSIS — J309 Allergic rhinitis, unspecified: Secondary | ICD-10-CM | POA: Diagnosis not present

## 2018-01-13 DIAGNOSIS — R0902 Hypoxemia: Secondary | ICD-10-CM | POA: Diagnosis not present

## 2018-01-18 ENCOUNTER — Other Ambulatory Visit: Payer: Self-pay | Admitting: Family Medicine

## 2018-01-18 ENCOUNTER — Ambulatory Visit
Admission: RE | Admit: 2018-01-18 | Discharge: 2018-01-18 | Disposition: A | Discharge status: Home / Self Care | Payer: PPO | Source: Ambulatory Visit | Attending: Family Medicine | Admitting: Family Medicine

## 2018-01-18 DIAGNOSIS — Z1231 Encounter for screening mammogram for malignant neoplasm of breast: Secondary | ICD-10-CM | POA: Insufficient documentation

## 2018-01-18 DIAGNOSIS — R928 Other abnormal and inconclusive findings on diagnostic imaging of breast: Secondary | ICD-10-CM

## 2018-01-27 DIAGNOSIS — E785 Hyperlipidemia, unspecified: Secondary | ICD-10-CM | POA: Diagnosis not present

## 2018-01-27 DIAGNOSIS — K219 Gastro-esophageal reflux disease without esophagitis: Secondary | ICD-10-CM | POA: Diagnosis not present

## 2018-01-27 DIAGNOSIS — I10 Essential (primary) hypertension: Secondary | ICD-10-CM | POA: Diagnosis not present

## 2018-01-27 DIAGNOSIS — J449 Chronic obstructive pulmonary disease, unspecified: Secondary | ICD-10-CM | POA: Diagnosis not present

## 2018-01-30 ENCOUNTER — Ambulatory Visit
Admission: RE | Admit: 2018-01-30 | Discharge: 2018-01-30 | Disposition: A | Discharge status: Home / Self Care | Payer: PPO | Source: Ambulatory Visit | Attending: Family Medicine | Admitting: Family Medicine

## 2018-01-30 DIAGNOSIS — R928 Other abnormal and inconclusive findings on diagnostic imaging of breast: Secondary | ICD-10-CM

## 2018-01-30 DIAGNOSIS — R922 Inconclusive mammogram: Secondary | ICD-10-CM | POA: Diagnosis not present

## 2018-02-01 ENCOUNTER — Ambulatory Visit (INDEPENDENT_AMBULATORY_CARE_PROVIDER_SITE_OTHER): Payer: PPO | Admitting: Family Medicine

## 2018-02-01 ENCOUNTER — Encounter: Payer: Self-pay | Admitting: Family Medicine

## 2018-02-01 VITALS — BP 140/78 | HR 58 | Temp 97.8°F | Ht 63.0 in | Wt 120.5 lb

## 2018-02-01 DIAGNOSIS — Z23 Encounter for immunization: Secondary | ICD-10-CM

## 2018-02-01 DIAGNOSIS — L720 Epidermal cyst: Secondary | ICD-10-CM | POA: Insufficient documentation

## 2018-02-01 DIAGNOSIS — R490 Dysphonia: Secondary | ICD-10-CM

## 2018-02-01 DIAGNOSIS — R634 Abnormal weight loss: Secondary | ICD-10-CM

## 2018-02-01 DIAGNOSIS — M542 Cervicalgia: Secondary | ICD-10-CM | POA: Diagnosis not present

## 2018-02-01 NOTE — Progress Notes (Signed)
BP 140/78 (BP Location: Left Arm, Patient Position: Sitting, Cuff Size: Normal)   Pulse (!) 58   Temp 97.8 F (36.6 C) (Oral)   Ht 5\' 3"  (1.6 m)   Wt 120 lb 8 oz (54.7 kg)   SpO2 98% Comment: 3 L, pulsating  BMI 21.35 kg/m    CC: f/u labs Subjective:    [name], female    DOB: 1940-09-30, 77 y.o.   MRN: [id number]  HPI: [name] [name] is a 77 y.o. female presenting on 02/01/2018 for Follow-up (Says she was told Dr. Darnell Level wanted to see her after having blood work. )   Chronic hoarseness - saw ENT Richardson Landry) - thought LPR related. Restarted taking PPI omeprazole 40mg  QOD.   Worsening neck pain over the past 1 wk, tends to flare every 6 months. Treating with hot compresses and salon pas patch which helped. She hasn't tried medication for pain. No inciting trauma/injury. Known swelling L occipital scalp, not bothersome.  3 lb weight loss - appetite ok, eats until she's full.   Relevant past medical, surgical, family and social history reviewed and updated as indicated. Interim medical history since our last visit reviewed. Allergies and medications reviewed and updated. Outpatient Medications Prior to Visit  Medication Sig Dispense Refill  . albuterol (PROAIR HFA) 108 (90 Base) MCG/ACT inhaler Inhale 2 puffs into the lungs every 4 (four) hours as needed. 1 Inhaler 5  . albuterol (PROVENTIL) (2.5 MG/3ML) 0.083% nebulizer solution Take every 4-6 hours as needed for shortness of breath 75 mL 5  . arformoterol (BROVANA) 15 MCG/2ML NEBU Take 2 mLs (15 mcg total) by nebulization 2 (two) times daily. 120 mL 11  . Biotin 10 MG CAPS Take 1 capsule by mouth daily.    . budesonide (PULMICORT) 0.5 MG/2ML nebulizer solution USE THE CONTENTS OF ONE VIAL (2ML) BY NEBULIZATION TWICE A DAY. 60 mL 2  . buPROPion (WELLBUTRIN SR) 150 MG 12 hr tablet Take 1 tablet (150 mg total) by mouth 2 (two) times daily. 180 tablet 1  . Calcium Carb-Cholecalciferol (CALCIUM 600+D3) 600-200 MG-UNIT  TABS Take 1 tablet by mouth daily.    . cholecalciferol (VITAMIN D) 1000 units tablet Take 2,000 Units by mouth daily.     Marland Kitchen CRANBERRY PO Take 2 tablets by mouth every morning.     Marland Kitchen estradiol (ESTRACE VAGINAL) 0.1 MG/GM vaginal cream Apply 0.5mg  (pea-sized amount)  just inside the vaginal introitus with a finger-tip every night for two weeks and then Monday, Wednesday and Friday nights. 30 g 12  . ezetimibe (ZETIA) 10 MG tablet Take 1 tablet (10 mg total) by mouth daily. 90 tablet 1  . losartan (COZAAR) 25 MG tablet Take 1 tablet (25 mg total) by mouth daily. 90 tablet 1  . metoprolol tartrate (LOPRESSOR) 25 MG tablet Take 1 tablet (25 mg total) by mouth 2 (two) times daily. 180 tablet 1  . omeprazole (PRILOSEC) 40 MG capsule Take 1 capsule (40 mg total) by mouth daily as needed (heartburn). 30 capsule 0  . pravastatin (PRAVACHOL) 80 MG tablet Take 1 tablet (80 mg total) by mouth at bedtime. 90 tablet 1  . psyllium (METAMUCIL) 58.6 % powder Take 1 packet by mouth at bedtime.     . sertraline (ZOLOFT) 100 MG tablet TAKE 1 TABLET EVERY MORNING AND 1/2 TABLET EVERY EVENING 45 tablet 6  . tiotropium (SPIRIVA HANDIHALER) 18 MCG inhalation capsule INHALE THE CONTENTS OF 1 CAPSULE ONE DAILY *NOT FOR ORAL USE*.  30 capsule 6   No facility-administered medications prior to visit.      Per HPI unless specifically indicated in ROS section below Review of Systems     Objective:    BP 140/78 (BP Location: Left Arm, Patient Position: Sitting, Cuff Size: Normal)   Pulse (!) 58   Temp 97.8 F (36.6 C) (Oral)   Ht 5\' 3"  (1.6 m)   Wt 120 lb 8 oz (54.7 kg)   SpO2 98% Comment: 3 L, pulsating  BMI 21.35 kg/m   Wt Readings from Last 3 Encounters:  02/01/18 120 lb 8 oz (54.7 kg)  12/29/17 123 lb 8 oz (56 kg)  09/27/17 131 lb (59.4 kg)    Physical Exam  Constitutional: She appears well-developed and well-nourished. No distress.  HENT:  Head: Normocephalic and atraumatic.  Large mobile cyst below skin  L occipital scalp without erythema or warmth  Musculoskeletal: Normal range of motion. She exhibits no edema.  FROM cervical neck, discomfort with movement but no limitations of movement.  No midline cervical spine tenderness, mild discomfort upper midline thoracic spine, no paracervical mm tenderness. Discomfort at L trap belly.   Neurological: She is alert.  Skin: Skin is warm and dry. No erythema.  Psychiatric: She has a normal mood and affect.  Nursing note and vitals reviewed.  Results for orders placed or performed in visit on 12/29/17  Lipid panel  Result Value Ref Range   Cholesterol 153 0 - 200 mg/dL   Triglycerides 93.0 0.0 - 149.0 mg/dL   HDL 70.90 >39.00 mg/dL   VLDL 18.6 0.0 - 40.0 mg/dL   LDL Cholesterol 64 0 - 99 mg/dL   Total CHOL/HDL Ratio 2    NonHDL 82.42   Comprehensive metabolic panel  Result Value Ref Range   Sodium 140 135 - 145 mEq/L   Potassium 4.3 3.5 - 5.1 mEq/L   Chloride 98 96 - 112 mEq/L   CO2 36 (H) 19 - 32 mEq/L   Glucose, Bld 110 (H) 70 - 99 mg/dL   BUN 10 6 - 23 mg/dL   Creatinine, Ser 0.76 0.40 - 1.20 mg/dL   Total Bilirubin 0.5 0.2 - 1.2 mg/dL   Alkaline Phosphatase 38 (L) 39 - 117 U/L   AST 16 0 - 37 U/L   ALT 11 0 - 35 U/L   Total Protein 7.1 6.0 - 8.3 g/dL   Albumin 4.3 3.5 - 5.2 g/dL   Calcium 9.8 8.4 - 10.5 mg/dL   GFR 78.48 >60.00 mL/min  TSH  Result Value Ref Range   TSH 1.20 0.35 - 4.50 uIU/mL  CBC with Differential/Platelet  Result Value Ref Range   WBC 5.5 4.0 - 10.5 K/uL   RBC 4.34 3.87 - 5.11 Mil/uL   Hemoglobin 13.4 12.0 - 15.0 g/dL   HCT 40.4 36.0 - 46.0 %   MCV 93.1 78.0 - 100.0 fl   MCHC 33.3 30.0 - 36.0 g/dL   RDW 13.0 11.5 - 15.5 %   Platelets 163.0 150.0 - 400.0 K/uL   Neutrophils Relative % 65.6 43.0 - 77.0 %   Lymphocytes Relative 23.6 12.0 - 46.0 %   Monocytes Relative 7.7 3.0 - 12.0 %   Eosinophils Relative 2.6 0.0 - 5.0 %   Basophils Relative 0.5 0.0 - 3.0 %   Neutro Abs 3.6 1.4 - 7.7 K/uL   Lymphs Abs  1.3 0.7 - 4.0 K/uL   Monocytes Absolute 0.4 0.1 - 1.0 K/uL   Eosinophils Absolute 0.1 0.0 -  0.7 K/uL   Basophils Absolute 0.0 0.0 - 0.1 K/uL      Assessment & Plan:   Problem List Items Addressed This Visit    Weight loss, unintentional    Recent labwork reassuring. Will further assess at CPE next month.       Neck pain    No red flags. Anticipate cervical strain in setting of cervical DDD. Supportive care recommended - heating pad, salon pas, tylenol or nsaid PRN. Provided with cervical strain exercises to try. Update if worsening symptoms.       Hoarseness, persistent - Primary    Saw ENT with reassuring flex laryngoscopy - thought LPR related. I advised she take PPI daily for 1 month and then assess response.       Epidermal cyst    L occipital scalp, large size but not currently infected or inflamed. rec monitoring for now.        Other Visit Diagnoses    Need for influenza vaccination       Relevant Orders   Flu Vaccine QUAD 36+ mos IM (Completed)       No orders of the defined types were placed in this encounter.  Orders Placed This Encounter  Procedures  . Flu Vaccine QUAD 36+ mos IM    Follow up plan: Return if symptoms worsen or fail to improve.  Ria Bush, MD

## 2018-02-01 NOTE — Patient Instructions (Addendum)
Flu shot today Increase omeprazole to 40mg  daily at dinner time for 1 month and monitor effect on voice. If no better, then back down to every other day.  Return in October for physical.  For neck pain - try tylenol 500mg  with meals or aleve 220mg  once a day. Do exercises provided today.

## 2018-02-01 NOTE — Assessment & Plan Note (Signed)
L occipital scalp, large size but not currently infected or inflamed. rec monitoring for now.

## 2018-02-01 NOTE — Assessment & Plan Note (Addendum)
Saw ENT with reassuring flex laryngoscopy - thought LPR related. I advised she take PPI daily for 1 month and then assess response.

## 2018-02-01 NOTE — Assessment & Plan Note (Addendum)
Recent labwork reassuring. Will further assess at CPE next month.

## 2018-02-01 NOTE — Assessment & Plan Note (Addendum)
No red flags. Anticipate cervical strain in setting of cervical DDD. Supportive care recommended - heating pad, salon pas, tylenol or nsaid PRN. Provided with cervical strain exercises to try. Update if worsening symptoms.

## 2018-02-03 ENCOUNTER — Other Ambulatory Visit: Payer: Self-pay | Admitting: Family Medicine

## 2018-02-10 DIAGNOSIS — J449 Chronic obstructive pulmonary disease, unspecified: Secondary | ICD-10-CM | POA: Diagnosis not present

## 2018-02-13 ENCOUNTER — Other Ambulatory Visit: Payer: Self-pay | Admitting: Internal Medicine

## 2018-02-13 DIAGNOSIS — R0902 Hypoxemia: Secondary | ICD-10-CM | POA: Diagnosis not present

## 2018-02-13 DIAGNOSIS — J309 Allergic rhinitis, unspecified: Secondary | ICD-10-CM | POA: Diagnosis not present

## 2018-02-13 MED ORDER — BUDESONIDE 0.5 MG/2ML IN SUSP: RESPIRATORY_TRACT | 5 refills | Status: DC

## 2018-03-13 DIAGNOSIS — J449 Chronic obstructive pulmonary disease, unspecified: Secondary | ICD-10-CM | POA: Diagnosis not present

## 2018-03-15 DIAGNOSIS — R0902 Hypoxemia: Secondary | ICD-10-CM | POA: Diagnosis not present

## 2018-03-15 DIAGNOSIS — J309 Allergic rhinitis, unspecified: Secondary | ICD-10-CM | POA: Diagnosis not present

## 2018-03-16 ENCOUNTER — Ambulatory Visit (INDEPENDENT_AMBULATORY_CARE_PROVIDER_SITE_OTHER): Payer: PPO

## 2018-03-16 VITALS — BP 122/80 | HR 53 | Temp 97.4°F | Ht 64.0 in | Wt 121.5 lb

## 2018-03-16 DIAGNOSIS — Z0000 Encounter for general adult medical examination without abnormal findings: Secondary | ICD-10-CM | POA: Diagnosis not present

## 2018-03-16 NOTE — Patient Instructions (Signed)
[name] [name] , Thank you for taking time to come for your Medicare Wellness Visit. I appreciate your ongoing commitment to your health goals. Please review the following plan we discussed and let me know if I can assist you in the future.   These are the goals we discussed: Goals    . Increase physical activity     When weather permits, I will resume stretching and walking for 30 min 1-2 days per week.        This is a list of the screening recommended for you and due dates:  Health Maintenance  Topic Date Due  . DTaP/Tdap/Td vaccine (1 - Tdap) 09/19/2018*  . Tetanus Vaccine  09/19/2018  . Mammogram  01/19/2019  . Flu Shot  Completed  . DEXA scan (bone density measurement)  Completed  . Pneumonia vaccines  Completed  *Topic was postponed. The date shown is not the original due date.   Preventive Care for Adults  A healthy lifestyle and preventive care can promote health and wellness. Preventive health guidelines for adults include the following key practices.  . A routine yearly physical is a good way to check with your health care provider about your health and preventive screening. It is a chance to share any concerns and updates on your health and to receive a thorough exam.  . Visit your dentist for a routine exam and preventive care every 6 months. Brush your teeth twice a day and floss once a day. Good oral hygiene prevents tooth decay and gum disease.  . The frequency of eye exams is based on your age, health, family medical history, use  of contact lenses, and other factors. Follow your health care provider's recommendations for frequency of eye exams.  . Eat a healthy diet. Foods like vegetables, fruits, whole grains, low-fat dairy products, and lean protein foods contain the nutrients you need without too many calories. Decrease your intake of foods high in solid fats, added sugars, and salt. Eat the right amount of calories for you. Get information about a proper diet  from your health care provider, if necessary.  . Regular physical exercise is one of the most important things you can do for your health. Most adults should get at least 150 minutes of moderate-intensity exercise (any activity that increases your heart rate and causes you to sweat) each week. In addition, most adults need muscle-strengthening exercises on 2 or more days a week.  Silver Sneakers may be a benefit available to you. To determine eligibility, you may visit the website: www.silversneakers.com or contact program at 7047199631 Mon-Fri between 8AM-8PM.   . Maintain a healthy weight. The body mass index (BMI) is a screening tool to identify possible weight problems. It provides an estimate of body fat based on height and weight. Your health care provider can find your BMI and can help you achieve or maintain a healthy weight.   For adults 20 years and older: ? A BMI below 18.5 is considered underweight. ? A BMI of 18.5 to 24.9 is normal. ? A BMI of 25 to 29.9 is considered overweight. ? A BMI of 30 and above is considered obese.   . Maintain normal blood lipids and cholesterol levels by exercising and minimizing your intake of saturated fat. Eat a balanced diet with plenty of fruit and vegetables. Blood tests for lipids and cholesterol should begin at age 29 and be repeated every 5 years. If your lipid or cholesterol levels are high, you are over 50,  or you are at high risk for heart disease, you may need your cholesterol levels checked more frequently. Ongoing high lipid and cholesterol levels should be treated with medicines if diet and exercise are not working.  . If you smoke, find out from your health care provider how to quit. If you do not use tobacco, please do not start.  . If you choose to drink alcohol, please do not consume more than 2 drinks per day. One drink is considered to be 12 ounces (355 mL) of beer, 5 ounces (148 mL) of wine, or 1.5 ounces (44 mL) of liquor.  .  If you are 57-57 years old, ask your health care provider if you should take aspirin to prevent strokes.  . Use sunscreen. Apply sunscreen liberally and repeatedly throughout the day. You should seek shade when your shadow is shorter than you. Protect yourself by wearing long sleeves, pants, a wide-brimmed hat, and sunglasses year round, whenever you are outdoors.  . Once a month, do a whole body skin exam, using a mirror to look at the skin on your back. Tell your health care provider of new moles, moles that have irregular borders, moles that are larger than a pencil eraser, or moles that have changed in shape or color.

## 2018-03-16 NOTE — Progress Notes (Signed)
PCP notes:   Health maintenance:  No gaps identified.  Abnormal screenings:   Fall risk - hx of multiple falls Fall Risk  03/16/2018 08/12/2016 12/16/2015 03/04/2015 02/18/2014  Falls in the past year? Yes No No No No  Comment 2-3 falls due to loss of balance - - - -  Number falls in past yr: 2 or more - - - -  Injury with Fall? No - - - -  Risk Factor Category  High Fall Risk - - - -  Risk for fall due to : History of fall(s);Impaired balance/gait - - - -   Depression score: 5 Depression screen [facility] 2/9 03/16/2018 08/12/2016 03/10/2016 12/16/2015 03/04/2015  Decreased Interest 0 0 0 2 0  Down, Depressed, Hopeless 0 0 0 1 1  PHQ - 2 Score 0 0 0 3 1  Altered sleeping 0 - 1 0 -  Tired, decreased energy 1 - 1 2 -  Change in appetite 0 - 0 0 -  Feeling bad or failure about yourself  0 - 0 0 -  Trouble concentrating 2 - 0 0 -  Moving slowly or fidgety/restless 0 - 0 0 -  Suicidal thoughts 2 - 0 1 -  PHQ-9 Score 5 - 2 6 -  Difficult doing work/chores Somewhat difficult - Somewhat difficult Very difficult -  Some recent data might be hidden   Patient concerns:   None  Nurse concerns:  None  Next PCP appt:   03/22/18 @ 1230

## 2018-03-16 NOTE — Progress Notes (Signed)
Subjective:   CENA BRUHN is a 77 y.o. female who presents for Medicare Annual (Subsequent) preventive examination.  Review of Systems:  N/A Cardiac Risk Factors include: advanced age (>62men, >40 women);dyslipidemia;hypertension     Objective:     Vitals: BP 122/80 (BP Location: Right Arm, Patient Position: Sitting, Cuff Size: Normal)   Pulse (!) 53   Temp (!) 97.4 F (36.3 C) (Oral)   Ht 5\' 4"  (1.626 m) Comment: SHOES  Wt 121 lb 8 oz (55.1 kg)   SpO2 97%   BMI 20.86 kg/m   Body mass index is 20.86 kg/m.  Advanced Directives 03/16/2018 08/12/2016 04/30/2014 04/24/2014  Does Patient Have a Medical Advance Directive? Yes No No No  Type of Paramedic of [place];Living will - - -  Copy of [place] in Chart? No - copy requested - - -  Would patient like information on creating a medical advance directive? - - - No - patient declined information    Tobacco Social History   Tobacco Use  Smoking Status Former Smoker  . Packs/day: 3.00  . Years: 34.00  . Pack years: 102.00  . Types: Cigarettes  . Last attempt to quit: 05/29/1995  . Years since quitting: 22.8  Smokeless Tobacco Never Used     Counseling given: No   Clinical Intake:  Pre-visit preparation completed: Yes  Pain : No/denies pain Pain Score: 0-No pain     Nutritional Status: BMI of 19-24  Normal Nutritional Risks: None Diabetes: No  How often do you need to have someone help you when you read instructions, pamphlets, or other written materials from your doctor or pharmacy?: 1 - Never What is the last grade level you completed in school?: 12th grade + 3 yrs college  Interpreter Needed?: No  Comments: pt is a widow and lives alone Information entered by :: LPinson, LPN  Past Medical History:  Diagnosis Date  . Chronic cystitis    recurrent UTI (4+ in last year), on suppressive doxy (uro Cope)  . Colon polyp   . 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  Depression with anxiety   . Diverticular disease 10/09/09   CT abd no sigmoid mass DDD L5/S1  . Dyslipidemia   . GERD (gastroesophageal reflux disease)   . Hematuria    h/o kidney stones  . Hiatal hernia   . Hypertension   . Left scapholunate ligament tear 2014   found on imaging  . Lung cancer ([place]) 1987   Partial RLL Lobectomy.  . Osteoarthritis   . Osteopenia 08/2011   DEXA: L femur -1.6  . Peptic ulcer   . Spinal abscess ([place]) 2000   secondary to periodontal disease   Past Surgical History:  Procedure Laterality Date  . BLADDER SURGERY    . BREAST BIOPSY Left 03/99   neg  . CHOLECYSTECTOMY  02/10/09  . COLONOSCOPY WITH PROPOFOL N/A 04/30/2014   Procedure: COLONOSCOPY WITH PROPOFOL;  Surgeon: Irene Shipper, MD;  Location: [facility];  Service: Endoscopy;  Laterality: N/A;  . CYSTOSCOPY  02/11/07 and 09/25/08   Dr. Jacqlyn Larsen, normal  . DEXA  08/2011   T femur -1.6, T spine -0.6  . Broxton pneumonia sepsis due to gum surgery  . OOPHORECTOMY    . REPLACEMENT TOTAL KNEE  09/2009   Right  . TOTAL ABDOMINAL HYSTERECTOMY  07/18/01   benign reason, BSO, vag repair secondary prolapse  .  TUBAL LIGATION     bilateral   Family History  Problem Relation Age of Onset  . Pancreatitis Mother   . Heart disease Father   . Hypertension Father   . Emphysema Father   . Arthritis Brother   . Hypertension Brother   . Cancer Maternal Aunt        ovarian cancer  . Kidney disease Neg Hx   . Prostate cancer Neg Hx   . Bladder Cancer Neg Hx    Social History   Socioeconomic History  . Marital status: Married    Spouse name: Not on file  . Number of children: 2  . Years of education: Not on file  . Highest education level: Not on file  Occupational History  . Occupation: Retired    Fish farm manager: RETIRED    Comment: prior Glass blower/designer at [employer]  . Financial resource strain: Not on file  . Food insecurity:    Worry: Not on file      Inability: Not on file  . Transportation needs:    Medical: Not on file    Non-medical: Not on file  Tobacco Use  . Smoking status: Former Smoker    Packs/day: 3.00    Years: 34.00    Pack years: 102.00    Types: Cigarettes    Last attempt to quit: 05/29/1995    Years since quitting: 22.8  . Smokeless tobacco: Never Used  Substance and Sexual Activity  . Alcohol use: Yes    Alcohol/week: 0.0 standard drinks    Comment: wine occasionally  . Drug use: No  . Sexual activity: Not Currently  Lifestyle  . Physical activity:    Days per week: Not on file    Minutes per session: Not on file  . Stress: Not on file  Relationships  . Social connections:    Talks on phone: Not on file    Gets together: Not on file    Attends religious service: Not on file    Active member of club or organization: Not on file    Attends meetings of clubs or organizations: Not on file    Relationship status: Not on file  Other Topics Concern  . Not on file  Social History Narrative   Caffeine: 4 cups coffee   L[facility] in independent living at [facility], 2nd husband died of heart attack. Two stepsons. 3rd husband died summer 2018   Activity: Walks for exercise   Diet: good water, fruits/vegetables daily      Advanced directed - would want HCPOA to be husband, Karlton Lemon. Does not have set up - requests handout.    Outpatient Encounter Medications as of 03/16/2018  Medication Sig  . albuterol (PROAIR HFA) 108 (90 Base) MCG/ACT inhaler Inhale 2 puffs into the lungs every 4 (four) hours as needed.  Marland Kitchen albuterol (PROVENTIL) (2.5 MG/3ML) 0.083% nebulizer solution Take every 4-6 hours as needed for shortness of breath  . arformoterol (BROVANA) 15 MCG/2ML NEBU Take 2 mLs (15 mcg total) by nebulization 2 (two) times daily.  . Biotin 10 MG CAPS Take 1 capsule by mouth daily.  . budesonide (PULMICORT) 0.5 MG/2ML nebulizer solution USE THE CONTENTS OF ONE VIAL (2ML) BY NEBULIZATION  TWICE A DAY.  Marland Kitchen buPROPion (WELLBUTRIN SR) 150 MG 12 hr tablet Take 1 tablet (150 mg total) by mouth 2 (two) times daily.  . Calcium Carb-Cholecalciferol (CALCIUM 600+D3) 600-200 MG-UNIT TABS Take 1 tablet by  mouth daily.  . cholecalciferol (VITAMIN D) 1000 units tablet Take 2,000 Units by mouth daily.   Marland Kitchen CRANBERRY PO Take 2 tablets by mouth every morning.   Marland Kitchen estradiol (ESTRACE VAGINAL) 0.1 MG/GM vaginal cream Apply 0.5mg  (pea-sized amount)  just inside the vaginal introitus with a finger-tip every night for two weeks and then Monday, Wednesday and Friday nights.  . ezetimibe (ZETIA) 10 MG tablet Take 1 tablet (10 mg total) by mouth daily.  Marland Kitchen losartan (COZAAR) 25 MG tablet Take 1 tablet (25 mg total) by mouth daily.  . metoprolol tartrate (LOPRESSOR) 25 MG tablet Take 1 tablet (25 mg total) by mouth 2 (two) times daily.  Marland Kitchen omeprazole (PRILOSEC) 40 MG capsule TAKE 1 CAPSULE (40 MG TOTAL) BY MOUTH DAILY AS NEEDED (HEARTBURN).  . pravastatin (PRAVACHOL) 80 MG tablet Take 1 tablet (80 mg total) by mouth at bedtime.  . psyllium (METAMUCIL) 58.6 % powder Take 1 packet by mouth at bedtime.   . sertraline (ZOLOFT) 100 MG tablet TAKE 1 TABLET EVERY MORNING AND 1/2 TABLET EVERY EVENING  . tiotropium (SPIRIVA HANDIHALER) 18 MCG inhalation capsule INHALE THE CONTENTS OF 1 CAPSULE ONE DAILY *NOT FOR ORAL USE*.   No facility-administered encounter medications on file as of 03/16/2018.     Activities of Daily Living In your present state of health, do you have any difficulty performing the following activities: 03/16/2018  Hearing? N  Vision? Y  Difficulty concentrating or making decisions? Y  Walking or climbing stairs? Y  Dressing or bathing? N  Doing errands, shopping? N  Preparing Food and eating ? N  Using the Toilet? N  In the past six months, have you accidently leaked urine? Y  Do you have problems with loss of bowel control? N  Managing your Medications? N  Managing your Finances? N    Housekeeping or managing your Housekeeping? N  Some recent data might be hidden    Patient Care Team: Ria Bush, MD as PCP - General (Family Medicine) Minna Merritts, MD as Consulting Physician (Cardiology)    Assessment:   This is a routine wellness examination for [place].  Hearing Screening   125Hz  250Hz  500Hz  1000Hz  2000Hz  3000Hz  4000Hz  6000Hz  8000Hz   Right ear:   40 40 40  40    Left ear:   40 40 40  40      Visual Acuity Screening   Right eye Left eye Both eyes  Without correction:     With correction: 20/70 20/25-1 20/20     Exercise Activities and Dietary recommendations Current Exercise Habits: Home exercise routine, Type of exercise: walking, Time (Minutes): 10, Frequency (Times/Week): 7, Weekly Exercise (Minutes/Week): 70, Intensity: Mild, Exercise limited by: None identified  Goals    . Increase physical activity     When weather permits, I will resume stretching and walking for 30 min 1-2 days per week.        Fall Risk Fall Risk  03/16/2018 08/12/2016 12/16/2015 03/04/2015 02/18/2014  Falls in the past year? Yes No No No No  Comment 2-3 falls due to loss of balance - - - -  Number falls in past yr: 2 or more - - - -  Injury with Fall? No - - - -  Risk Factor Category  High Fall Risk - - - -  Risk for fall due to : History of fall(s);Impaired balance/gait - - - -   Depression Screen PHQ 2/9 Scores 03/16/2018 08/12/2016 03/10/2016 12/16/2015  PHQ - 2  Score 0 0 0 3  PHQ- 9 Score 5 - 2 6     Cognitive Function MMSE - Mini Mental State Exam 03/16/2018 08/12/2016  Orientation to time 5 5  Orientation to Place 5 5  Registration 3 3  Attention/ Calculation 0 0  Recall 3 2  Recall-comments - pt was unable to recall 1 of 3 words  Language- name 2 objects 0 0  Language- repeat 1 1  Language- follow 3 step command 3 3  Language- read & follow direction 0 0  Write a sentence 0 0  Copy design 0 0  Total score 20 19     PLEASE NOTE: A Mini-Cog screen  was completed. Maximum score is 20. A value of 0 denotes this part of Folstein MMSE was not completed or the patient failed this part of the Mini-Cog screening.   Mini-Cog Screening Orientation to Time - Max 5 pts Orientation to Place - Max 5 pts Registration - Max 3 pts Recall - Max 3 pts Language Repeat - Max 1 pts Language Follow 3 Step Command - Max 3 pts     Immunization History  Administered Date(s) Administered  . Influenza Split 03/27/2012  . Influenza Whole 02/21/2006, 02/21/2008, 02/22/2011  . Influenza,inj,Quad PF,6+ Mos 02/16/2013, 02/18/2014, 03/04/2015, 01/21/2016, 06/15/2016, 02/01/2018  . Pneumococcal Conjugate-13 02/18/2014  . Pneumococcal Polysaccharide-23 02/10/2009  . Td 05/25/1998, 09/18/2008  . Zoster 11/13/2012      Screening Tests Health Maintenance  Topic Date Due  . DTaP/Tdap/Td (1 - Tdap) 09/19/2018 (Originally 09/19/2008)  . TETANUS/TDAP  09/19/2018  . MAMMOGRAM  01/19/2019  . INFLUENZA VACCINE  Completed  . DEXA SCAN  Completed  . PNA vac Low Risk Adult  Completed      Plan:     I have personally reviewed, addressed, and noted the following in the patient's chart:  A. Medical and social history B. Use of alcohol, tobacco or illicit drugs  C. Current medications and supplements D. Functional ability and status E.  Nutritional status F.  Physical activity G. Advance directives H. List of other physicians I.  Hospitalizations, surgeries, and ER visits in previous 12 months J.  [place] to include hearing, vision, cognitive, depression L. Referrals and appointments - none  In addition, I have reviewed and discussed with patient certain preventive protocols, quality metrics, and best practice recommendations. A written personalized care plan for preventive services as well as general preventive health recommendations were provided to patient.  See attached scanned questionnaire for additional information.   Signed,   Lindell Noe, MHA, BS, LPN Health Coach

## 2018-03-21 NOTE — Assessment & Plan Note (Addendum)
Advanced directed - HCPOA is [name] [name], son. Asked to bring [place] a copy to update chart.

## 2018-03-21 NOTE — Progress Notes (Signed)
BP 130/78 (BP Location: Left Arm, Patient Position: Sitting, Cuff Size: Normal)   Pulse 63   Temp 97.6 F (36.4 C) (Oral)   Ht 5\' 4"  (1.626 m)   Wt 121 lb (54.9 kg)   SpO2 96%   BMI 20.77 kg/m    CC: CPE Subjective:    Patient ID: Norman Herrlich, female    DOB: 04/27/1941, 77 y.o.   MRN: [id number]  HPI: SHATANA SAXTON is a 77 y.o. female presenting on 03/22/2018 for Annual Exam (Pt 2. )   Saw Katha Cabal last week for medicare wellness visit. Note reviewed.   Several falls due to balance.  Ongoing hoarseness - did not try daily PPI.  Uses rolling walker.  Improved urinary urgency, no recent UTIs - has changed wiping and has started taking cranberry tablets daily.   Preventative: COLONOSCOPY WITH PROPOFOL Date: 04/30/2014 Irene Shipper - severe diverticulosis with sigmoid stenosis, no polyps, f/u PRN Well woman - has stopped.  Mammogram 01/2018 (s/p diagnostic mammo L sided) - rpt 1 yr DEXA osteopenia - Date: 5/20218 DEXA T -0.6 spine, -1.7 hip stable, drinks 1 glass of milk daily. She continues calcium tablet daily and vitamin D daily.  Flu shot yearly Td 2010  Pneumonia 2010 , prevnar 2015 Shingles - 10/2012  shingrix - discussed Advanced directed - HCPOA is [name], son. Asked to bring [place] a copy to update chart.  Seat belt use discussed Sunscreen use discussed. No changing moles on skin. Ex smoker - quit 1997 Alcohol - rare Dentist yearly - due Eye exam yearly - due  Caffeine: 4 cups coffee  Remarried for the third time, 2nd husband died of heart attack. Two stepsons  Activity: Walks some for exercise - 15 min/day Diet: good water, fruits/vegetables daily   Relevant past medical, surgical, family and social history reviewed and updated as indicated. Interim medical history since our last visit reviewed. Allergies and medications reviewed and updated. Outpatient Medications Prior to Visit  Medication Sig Dispense Refill  . albuterol (PROVENTIL) (2.5 MG/3ML)  0.083% nebulizer solution Take every 4-6 hours as needed for shortness of breath 75 mL 5  . arformoterol (BROVANA) 15 MCG/2ML NEBU Take 2 mLs (15 mcg total) by nebulization 2 (two) times daily. 120 mL 11  . Biotin 10 MG CAPS Take 1 capsule by mouth daily.    . budesonide (PULMICORT) 0.5 MG/2ML nebulizer solution USE THE CONTENTS OF ONE VIAL (2ML) BY NEBULIZATION TWICE A DAY. 60 mL 5  . buPROPion (WELLBUTRIN SR) 150 MG 12 hr tablet Take 1 tablet (150 mg total) by mouth 2 (two) times daily. 180 tablet 1  . Calcium Carb-Cholecalciferol (CALCIUM 600+D3) 600-200 MG-UNIT TABS Take 1 tablet by mouth daily.    . cholecalciferol (VITAMIN D) 1000 units tablet Take 2,000 Units by mouth daily.     Marland Kitchen CRANBERRY PO Take 2 tablets by mouth every morning.     Marland Kitchen estradiol (ESTRACE VAGINAL) 0.1 MG/GM vaginal cream Apply 0.5mg  (pea-sized amount)  just inside the vaginal introitus with a finger-tip every night for two weeks and then Monday, Wednesday and Friday nights. 30 g 12  . ezetimibe (ZETIA) 10 MG tablet Take 1 tablet (10 mg total) by mouth daily. 90 tablet 1  . losartan (COZAAR) 25 MG tablet Take 1 tablet (25 mg total) by mouth daily. 90 tablet 1  . metoprolol tartrate (LOPRESSOR) 25 MG tablet Take 1 tablet (25 mg total) by mouth 2 (two) times daily. 180 tablet 1  .  omeprazole (PRILOSEC) 40 MG capsule TAKE 1 CAPSULE (40 MG TOTAL) BY MOUTH DAILY AS NEEDED (HEARTBURN). 30 capsule 1  . pravastatin (PRAVACHOL) 80 MG tablet Take 1 tablet (80 mg total) by mouth at bedtime. 90 tablet 1  . psyllium (METAMUCIL) 58.6 % powder Take 1 packet by mouth at bedtime.     . sertraline (ZOLOFT) 100 MG tablet TAKE 1 TABLET EVERY MORNING AND 1/2 TABLET EVERY EVENING 45 tablet 6  . tiotropium (SPIRIVA HANDIHALER) 18 MCG inhalation capsule INHALE THE CONTENTS OF 1 CAPSULE ONE DAILY *NOT FOR ORAL USE*. 30 capsule 6  . albuterol (PROAIR HFA) 108 (90 Base) MCG/ACT inhaler Inhale 2 puffs into the lungs every 4 (four) hours as needed. 1  Inhaler 5   No facility-administered medications prior to visit.      Per HPI unless specifically indicated in ROS section below Review of Systems  Constitutional: Negative for activity change, appetite change, chills, fatigue, fever and unexpected weight change.  HENT: Negative for hearing loss.   Eyes: Negative for visual disturbance.  Respiratory: Positive for chest tightness, shortness of breath (exertional) and wheezing. Negative for cough.   Cardiovascular: Negative for chest pain, palpitations and leg swelling.  Gastrointestinal: Negative for abdominal distention, abdominal pain, blood in stool, constipation, diarrhea, nausea and vomiting.  Genitourinary: Negative for difficulty urinating and hematuria.  Musculoskeletal: Negative for arthralgias, myalgias and neck pain.  Skin: Negative for rash.  Neurological: Negative for dizziness, seizures, syncope and headaches.  Hematological: Negative for adenopathy. Does not bruise/bleed easily.  Psychiatric/Behavioral: Negative for dysphoric mood. The patient is not nervous/anxious.        Objective:    BP 130/78 (BP Location: Left Arm, Patient Position: Sitting, Cuff Size: Normal)   Pulse 63   Temp 97.6 F (36.4 C) (Oral)   Ht 5\' 4"  (1.626 m)   Wt 121 lb (54.9 kg)   SpO2 96%   BMI 20.77 kg/m   Wt Readings from Last 3 Encounters:  03/22/18 121 lb (54.9 kg)  03/16/18 121 lb 8 oz (55.1 kg)  02/01/18 120 lb 8 oz (54.7 kg)    Physical Exam  Constitutional: She is oriented to person, place, and time. She appears well-developed and well-nourished. No distress.  HENT:  Head: Normocephalic and atraumatic.  Right Ear: Hearing, tympanic membrane, external ear and ear canal normal.  Left Ear: Hearing, tympanic membrane, external ear and ear canal normal.  Nose: Nose normal.  Mouth/Throat: Uvula is midline, oropharynx is clear and moist and mucous membranes are normal. No oropharyngeal exudate, posterior oropharyngeal edema or  posterior oropharyngeal erythema.  Eyes: Pupils are equal, round, and reactive to light. Conjunctivae and EOM are normal. No scleral icterus.  Neck: Normal range of motion. Neck supple. Carotid bruit is not present. No thyromegaly present.  Cardiovascular: Normal rate, regular rhythm, normal heart sounds and intact distal pulses.  No murmur heard. Pulses:      Radial pulses are 2+ on the right side, and 2+ on the left side.  Pulmonary/Chest: Effort normal. No respiratory distress. She has wheezes (minimal expiratory). She has no rales.  Abdominal: Soft. Bowel sounds are normal. She exhibits no distension and no mass. There is no tenderness. There is no rebound and no guarding.  Musculoskeletal: Normal range of motion. She exhibits no edema.  Lymphadenopathy:    She has no cervical adenopathy.  Neurological: She is alert and oriented to person, place, and time.  CN grossly intact, station and gait intact  Skin: Skin is  warm and dry. No rash noted.  Psychiatric: She has a normal mood and affect. Her behavior is normal. Judgment and thought content normal.  Nursing note and vitals reviewed.  Results for orders placed or performed in visit on 12/29/17  Lipid panel  Result Value Ref Range   Cholesterol 153 0 - 200 mg/dL   Triglycerides 93.0 0.0 - 149.0 mg/dL   HDL 70.90 >39.00 mg/dL   VLDL 18.6 0.0 - 40.0 mg/dL   LDL Cholesterol 64 0 - 99 mg/dL   Total CHOL/HDL Ratio 2    NonHDL 82.42   Comprehensive metabolic panel  Result Value Ref Range   Sodium 140 135 - 145 mEq/L   Potassium 4.3 3.5 - 5.1 mEq/L   Chloride 98 96 - 112 mEq/L   CO2 36 (H) 19 - 32 mEq/L   Glucose, Bld 110 (H) 70 - 99 mg/dL   BUN 10 6 - 23 mg/dL   Creatinine, Ser 0.76 0.40 - 1.20 mg/dL   Total Bilirubin 0.5 0.2 - 1.2 mg/dL   Alkaline Phosphatase 38 (L) 39 - 117 U/L   AST 16 0 - 37 U/L   ALT 11 0 - 35 U/L   Total Protein 7.1 6.0 - 8.3 g/dL   Albumin 4.3 3.5 - 5.2 g/dL   Calcium 9.8 8.4 - 10.5 mg/dL   GFR 78.48  >60.00 mL/min  TSH  Result Value Ref Range   TSH 1.20 0.35 - 4.50 uIU/mL  CBC with Differential/Platelet  Result Value Ref Range   WBC 5.5 4.0 - 10.5 K/uL   RBC 4.34 3.87 - 5.11 Mil/uL   Hemoglobin 13.4 12.0 - 15.0 g/dL   HCT 40.4 36.0 - 46.0 %   MCV 93.1 78.0 - 100.0 fl   MCHC 33.3 30.0 - 36.0 g/dL   RDW 13.0 11.5 - 15.5 %   Platelets 163.0 150.0 - 400.0 K/uL   Neutrophils Relative % 65.6 43.0 - 77.0 %   Lymphocytes Relative 23.6 12.0 - 46.0 %   Monocytes Relative 7.7 3.0 - 12.0 %   Eosinophils Relative 2.6 0.0 - 5.0 %   Basophils Relative 0.5 0.0 - 3.0 %   Neutro Abs 3.6 1.4 - 7.7 K/uL   Lymphs Abs 1.3 0.7 - 4.0 K/uL   Monocytes Absolute 0.4 0.1 - 1.0 K/uL   Eosinophils Absolute 0.1 0.0 - 0.7 K/uL   Basophils Absolute 0.0 0.0 - 0.1 K/uL      Assessment & Plan:   Problem List Items Addressed This Visit    Weight loss, unintentional    This has stabilized - pt attributes to eating less since husband passed (husband had high metabolism)       Urinary urgency    Symptoms have improved with daily cranberry tablets.       Osteopenia    Reviewed with patient - continue cal/vit D.       MDD (major depressive disorder), recurrent episode, moderate (HCC)    Stable period - continue sertraline and wellbutrin.       Hoarseness, persistent    Suggested daily PPI x 1 month.       Health care maintenance - Primary    Preventative protocols reviewed and updated unless pt declined. Discussed healthy diet and lifestyle.       GOLD 4 COPD with emphysema    Appreciate pulm care - continue pulmicort, brovana nebs and spiriva with PRN albuterol/duonebs.      Relevant Medications   albuterol (PROAIR HFA) 108 (90  Base) MCG/ACT inhaler   Essential hypertension    Chronic, stable. Continue current regimen.       Dyslipidemia    Chronic, stable on current regimen. The 10-year ASCVD risk score Mikey Bussing DC Brooke Bonito., et al., 2013) is: 23.9%   Values used to calculate the score:      Age: 59 years     Sex: Female     Is Non-Hispanic African American: No     Diabetic: No     Tobacco smoker: No     Systolic Blood Pressure: 871 mmHg     Is BP treated: Yes     HDL Cholesterol: 70.9 mg/dL     Total Cholesterol: 153 mg/dL       Chronic respiratory failure with hypoxia (HCC)    Continue oxygen use.       CAD (coronary artery disease)   Advanced care planning/counseling discussion    Advanced directed - HCPOA is [name], son. Asked to bring [place] a copy to update chart.           Meds ordered this encounter  Medications  . albuterol (PROAIR HFA) 108 (90 Base) MCG/ACT inhaler    Sig: Inhale 2 puffs into the lungs every 4 (four) hours as needed.    Dispense:  1 Inhaler    Refill:  11   No orders of the defined types were placed in this encounter.   Follow up plan: Return in about 6 months (around 09/21/2018), or if symptoms worsen or fail to improve, for follow up visit.  Ria Bush, MD

## 2018-03-21 NOTE — Assessment & Plan Note (Signed)
Preventative protocols reviewed and updated unless pt declined. Discussed healthy diet and lifestyle.  

## 2018-03-22 ENCOUNTER — Ambulatory Visit (INDEPENDENT_AMBULATORY_CARE_PROVIDER_SITE_OTHER): Payer: PPO | Admitting: Family Medicine

## 2018-03-22 VITALS — BP 130/78 | HR 63 | Temp 97.6°F | Ht 64.0 in | Wt 121.0 lb

## 2018-03-22 DIAGNOSIS — J439 Emphysema, unspecified: Secondary | ICD-10-CM

## 2018-03-22 DIAGNOSIS — M8580 Other specified disorders of bone density and structure, unspecified site: Secondary | ICD-10-CM

## 2018-03-22 DIAGNOSIS — I2510 Atherosclerotic heart disease of native coronary artery without angina pectoris: Secondary | ICD-10-CM

## 2018-03-22 DIAGNOSIS — Z0000 Encounter for general adult medical examination without abnormal findings: Secondary | ICD-10-CM

## 2018-03-22 DIAGNOSIS — Z7189 Other specified counseling: Secondary | ICD-10-CM | POA: Diagnosis not present

## 2018-03-22 DIAGNOSIS — R490 Dysphonia: Secondary | ICD-10-CM

## 2018-03-22 DIAGNOSIS — I10 Essential (primary) hypertension: Secondary | ICD-10-CM | POA: Diagnosis not present

## 2018-03-22 DIAGNOSIS — J9611 Chronic respiratory failure with hypoxia: Secondary | ICD-10-CM | POA: Diagnosis not present

## 2018-03-22 DIAGNOSIS — E785 Hyperlipidemia, unspecified: Secondary | ICD-10-CM | POA: Diagnosis not present

## 2018-03-22 DIAGNOSIS — F331 Major depressive disorder, recurrent, moderate: Secondary | ICD-10-CM | POA: Diagnosis not present

## 2018-03-22 DIAGNOSIS — R634 Abnormal weight loss: Secondary | ICD-10-CM

## 2018-03-22 DIAGNOSIS — R3915 Urgency of urination: Secondary | ICD-10-CM

## 2018-03-22 MED ORDER — ALBUTEROL SULFATE HFA 108 (90 BASE) MCG/ACT IN AERS: 2.0000 | INHALATION_SPRAY | RESPIRATORY_TRACT | 11 refills | Status: DC | PRN

## 2018-03-22 NOTE — Assessment & Plan Note (Signed)
Suggested daily PPI x 1 month.

## 2018-03-22 NOTE — Assessment & Plan Note (Signed)
Continue oxygen use.

## 2018-03-22 NOTE — Assessment & Plan Note (Addendum)
This has stabilized - pt attributes to eating less since husband passed (husband had high metabolism)

## 2018-03-22 NOTE — Assessment & Plan Note (Deleted)
Chronic, stable on statin and zetia. The 10-year ASCVD risk score Mikey Bussing DC Brooke Bonito., et al., 2013) is: 23.9%   Values used to calculate the score:     Age: 77 years     Sex: Female     Is Non-Hispanic African American: No     Diabetic: No     Tobacco smoker: No     Systolic Blood Pressure: 749 mmHg     Is BP treated: Yes     HDL Cholesterol: 70.9 mg/dL     Total Cholesterol: 153 mg/dL

## 2018-03-22 NOTE — Patient Instructions (Addendum)
Increase omeprazole to '40mg'$  daily at dinner time for 1 month and monitor effect on voice. If no better, then back down to every other day.  If interested, check with pharmacy about new 2 shot shingles series (shingrix).   Schedule dental and eye doctor appointment.   Health Maintenance, Female Adopting a healthy lifestyle and getting preventive care can go a long way to promote health and wellness. Talk with your health care provider about what schedule of regular examinations is right for you. This is a good chance for you to check in with your provider about disease prevention and staying healthy. In between checkups, there are plenty of things you can do on your own. Experts have done a lot of research about which lifestyle changes and preventive measures are most likely to keep you healthy. Ask your health care provider for more information. Weight and diet Eat a healthy diet  Be sure to include plenty of vegetables, fruits, low-fat dairy products, and lean protein.  Do not eat a lot of foods high in solid fats, added sugars, or salt.  Get regular exercise. This is one of the most important things you can do for your health. ? Most adults should exercise for at least 150 minutes each week. The exercise should increase your heart rate and make you sweat (moderate-intensity exercise). ? Most adults should also do strengthening exercises at least twice a week. This is in addition to the moderate-intensity exercise.  Maintain a healthy weight  Body mass index (BMI) is a measurement that can be used to identify possible weight problems. It estimates body fat based on height and weight. Your health care provider can help determine your BMI and help you achieve or maintain a healthy weight.  For females 64 years of age and older: ? A BMI below 18.5 is considered underweight. ? A BMI of 18.5 to 24.9 is normal. ? A BMI of 25 to 29.9 is considered overweight. ? A BMI of 30 and above is considered  obese.  Watch levels of cholesterol and blood lipids  You should start having your blood tested for lipids and cholesterol at 77 years of age, then have this test every 5 years.  You may need to have your cholesterol levels checked more often if: ? Your lipid or cholesterol levels are high. ? You are older than 77 years of age. ? You are at high risk for heart disease.  Cancer screening Lung Cancer  Lung cancer screening is recommended for adults 25-44 years old who are at high risk for lung cancer because of a history of smoking.  A yearly low-dose CT scan of the lungs is recommended for people who: ? Currently smoke. ? Have quit within the past 15 years. ? Have at least a 30-pack-year history of smoking. A pack year is smoking an average of one pack of cigarettes a day for 1 year.  Yearly screening should continue until it has been 15 years since you quit.  Yearly screening should stop if you develop a health problem that would prevent you from having lung cancer treatment.  Breast Cancer  Practice breast self-awareness. This means understanding how your breasts normally appear and feel.  It also means doing regular breast self-exams. Let your health care provider know about any changes, no matter how small.  If you are in your 20s or 30s, you should have a clinical breast exam (CBE) by a health care provider every 1-3 years as part of a regular  health exam.  If you are 40 or older, have a CBE every year. Also consider having a breast X-ray (mammogram) every year.  If you have a family history of breast cancer, talk to your health care provider about genetic screening.  If you are at high risk for breast cancer, talk to your health care provider about having an MRI and a mammogram every year.  Breast cancer gene (BRCA) assessment is recommended for women who have family members with BRCA-related cancers. BRCA-related cancers  include: ? Breast. ? Ovarian. ? Tubal. ? Peritoneal cancers.  Results of the assessment will determine the need for genetic counseling and BRCA1 and BRCA2 testing.  Cervical Cancer Your health care provider may recommend that you be screened regularly for cancer of the pelvic organs (ovaries, uterus, and vagina). This screening involves a pelvic examination, including checking for microscopic changes to the surface of your cervix (Pap test). You may be encouraged to have this screening done every 3 years, beginning at age 21.  For women ages 30-65, health care providers may recommend pelvic exams and Pap testing every 3 years, or they may recommend the Pap and pelvic exam, combined with testing for human papilloma virus (HPV), every 5 years. Some types of HPV increase your risk of cervical cancer. Testing for HPV may also be done on women of any age with unclear Pap test results.  Other health care providers may not recommend any screening for nonpregnant women who are considered low risk for pelvic cancer and who do not have symptoms. Ask your health care provider if a screening pelvic exam is right for you.  If you have had past treatment for cervical cancer or a condition that could lead to cancer, you need Pap tests and screening for cancer for at least 20 years after your treatment. If Pap tests have been discontinued, your risk factors (such as having a new sexual partner) need to be reassessed to determine if screening should resume. Some women have medical problems that increase the chance of getting cervical cancer. In these cases, your health care provider may recommend more frequent screening and Pap tests.  Colorectal Cancer  This type of cancer can be detected and often prevented.  Routine colorectal cancer screening usually begins at 77 years of age and continues through 77 years of age.  Your health care provider may recommend screening at an earlier age if you have risk factors  for colon cancer.  Your health care provider may also recommend using home test kits to check for hidden blood in the stool.  A small camera at the end of a tube can be used to examine your colon directly (sigmoidoscopy or colonoscopy). This is done to check for the earliest forms of colorectal cancer.  Routine screening usually begins at age 50.  Direct examination of the colon should be repeated every 5-10 years through 77 years of age. However, you may need to be screened more often if early forms of precancerous polyps or small growths are found.  Skin Cancer  Check your skin from head to toe regularly.  Tell your health care provider about any new moles or changes in moles, especially if there is a change in a mole's shape or color.  Also tell your health care provider if you have a mole that is larger than the size of a pencil eraser.  Always use sunscreen. Apply sunscreen liberally and repeatedly throughout the day.  Protect yourself by wearing long sleeves, pants, a   wide-brimmed hat, and sunglasses whenever you are outside.  Heart disease, diabetes, and high blood pressure  High blood pressure causes heart disease and increases the risk of stroke. High blood pressure is more likely to develop in: ? People who have blood pressure in the high end of the normal range (130-139/85-89 mm Hg). ? People who are overweight or obese. ? People who are African American.  If you are 54-30 years of age, have your blood pressure checked every 3-5 years. If you are 3 years of age or older, have your blood pressure checked every year. You should have your blood pressure measured twice-once when you are at a hospital or clinic, and once when you are not at a hospital or clinic. Record the average of the two measurements. To check your blood pressure when you are not at a hospital or clinic, you can use: ? An automated blood pressure machine at a pharmacy. ? A home blood pressure monitor.  If  you are between 55 years and 21 years old, ask your health care provider if you should take aspirin to prevent strokes.  Have regular diabetes screenings. This involves taking a blood sample to check your fasting blood sugar level. ? If you are at a normal weight and have a low risk for diabetes, have this test once every three years after 77 years of age. ? If you are overweight and have a high risk for diabetes, consider being tested at a younger age or more often. Preventing infection Hepatitis B  If you have a higher risk for hepatitis B, you should be screened for this virus. You are considered at high risk for hepatitis B if: ? You were born in a country where hepatitis B is common. Ask your health care provider which countries are considered high risk. ? Your parents were born in a high-risk country, and you have not been immunized against hepatitis B (hepatitis B vaccine). ? You have HIV or AIDS. ? You use needles to inject street drugs. ? You live with someone who has hepatitis B. ? You have had sex with someone who has hepatitis B. ? You get hemodialysis treatment. ? You take certain medicines for conditions, including cancer, organ transplantation, and autoimmune conditions.  Hepatitis C  Blood testing is recommended for: ? Everyone born from [birth year] through 1965. ? Anyone with known risk factors for hepatitis C.  Sexually transmitted infections (STIs)  You should be screened for sexually transmitted infections (STIs) including gonorrhea and chlamydia if: ? You are sexually active and are younger than 77 years of age. ? You are older than 77 years of age and your health care provider tells you that you are at risk for this type of infection. ? Your sexual activity has changed since you were last screened and you are at an increased risk for chlamydia or gonorrhea. Ask your health care provider if you are at risk.  If you do not have HIV, but are at risk, it may be recommended  that you take a prescription medicine daily to prevent HIV infection. This is called pre-exposure prophylaxis (PrEP). You are considered at risk if: ? You are sexually active and do not regularly use condoms or know the HIV status of your partner(s). ? You take drugs by injection. ? You are sexually active with a partner who has HIV.  Talk with your health care provider about whether you are at high risk of being infected with HIV. If you choose to  begin PrEP, you should first be tested for HIV. You should then be tested every 3 months for as long as you are taking PrEP. Pregnancy  If you are premenopausal and you may become pregnant, ask your health care provider about preconception counseling.  If you may become pregnant, take 400 to 800 micrograms (mcg) of folic acid every day.  If you want to prevent pregnancy, talk to your health care provider about birth control (contraception). Osteoporosis and menopause  Osteoporosis is a disease in which the bones lose minerals and strength with aging. This can result in serious bone fractures. Your risk for osteoporosis can be identified using a bone density scan.  If you are 65 years of age or older, or if you are at risk for osteoporosis and fractures, ask your health care provider if you should be screened.  Ask your health care provider whether you should take a calcium or vitamin D supplement to lower your risk for osteoporosis.  Menopause may have certain physical symptoms and risks.  Hormone replacement therapy may reduce some of these symptoms and risks. Talk to your health care provider about whether hormone replacement therapy is right for you. Follow these instructions at home:  Schedule regular health, dental, and eye exams.  Stay current with your immunizations.  Do not use any tobacco products including cigarettes, chewing tobacco, or electronic cigarettes.  If you are pregnant, do not drink alcohol.  If you are  breastfeeding, limit how much and how often you drink alcohol.  Limit alcohol intake to no more than 1 drink per day for nonpregnant women. One drink equals 12 ounces of beer, 5 ounces of wine, or 1 ounces of hard liquor.  Do not use street drugs.  Do not share needles.  Ask your health care provider for help if you need support or information about quitting drugs.  Tell your health care provider if you often feel depressed.  Tell your health care provider if you have ever been abused or do not feel safe at home. This information is not intended to replace advice given to you by your health care provider. Make sure you discuss any questions you have with your health care provider. Document Released: 11/23/2010 Document Revised: 10/16/2015 Document Reviewed: 02/11/2015 Elsevier Interactive Patient Education  2018 [employer]  

## 2018-03-22 NOTE — Assessment & Plan Note (Signed)
Appreciate pulm care - continue pulmicort, brovana nebs and spiriva with PRN albuterol/duonebs.

## 2018-03-22 NOTE — Assessment & Plan Note (Signed)
Symptoms have improved with daily cranberry tablets.

## 2018-03-22 NOTE — Assessment & Plan Note (Signed)
Reviewed with patient - continue cal/vit D.

## 2018-03-22 NOTE — Assessment & Plan Note (Signed)
Chronic, stable. Continue current regimen. 

## 2018-03-22 NOTE — Assessment & Plan Note (Signed)
Stable period - continue sertraline and wellbutrin.

## 2018-03-22 NOTE — Assessment & Plan Note (Signed)
Chronic, stable on current regimen. The 10-year ASCVD risk score Mikey Bussing DC Brooke Bonito., et al., 2013) is: 23.9%   Values used to calculate the score:     Age: 77 years     Sex: Female     Is Non-Hispanic African American: No     Diabetic: No     Tobacco smoker: No     Systolic Blood Pressure: 486 mmHg     Is BP treated: Yes     HDL Cholesterol: 70.9 mg/dL     Total Cholesterol: 153 mg/dL

## 2018-04-04 NOTE — Progress Notes (Signed)
[facility]     Assessment and Plan:  Severe emphysema  group B. - Currently she is on nebulized Brovana, Pulmicort and albuterol, however she is not using them regularly. -She continues to have significant dyspnea on exertion, encouraged to use her nebulizers more regularly. -PCV13 02/18/14; PPSV 02/10/2009. Flu shot up to day.   Dyspnea on exertion.  --Likely multifactorial due to copd and deconditioning.  --Encouraged to try to continue to be active, and increase as tolerated.   Chronic hypoxic respiratory failure.  --Appears controlled with oxygen at 3L.  --Continue oxygen continuous, can use pulse dose via POC when active.    Return in about 1 year (around 04/06/2019).   Greater than 50% of the 25 minute visit was spent in counseling/coordination of care regarding copd, hypoxia, dyspnea.    Date: 04/04/2018  MRN# [id number] [name] [name] Apr 10, 1941    [name] [name] is a 77 y.o. old female seen in consultation for chief complaint of:    Chief Complaint  Patient presents with  . Emphysema    pt has some wheezing occasionally along with sob. She is on 3 L 02 24/7.    HPI:   The patient is a 77 year old female with a history of severe COPD.  She has been maintained on nebulized Brovana, Pulmicort, Spiriva, Mucinex twice daily.  She uses oxygen at 3 L sometimes to 4 L with exertion. She is using brovana, pulmicort bid. She also uses albuterol MDI and nebulized about 2 to 3 times per day. She tries to remain active, walking with a walker. She lives in independent living, [place].  She continues to drive.    CXR 07/01/16>>findings of severe emphysema without significant interstitial lung disease.  Medication:    Current Outpatient Medications:  .  albuterol (PROAIR HFA) 108 (90 Base) MCG/ACT inhaler, Inhale 2 puffs into the lungs every 4 (four) hours as needed., Disp: 1 Inhaler, Rfl: 11 .  albuterol (PROVENTIL) (2.5 MG/3ML) 0.083%  nebulizer solution, Take every 4-6 hours as needed for shortness of breath, Disp: 75 mL, Rfl: 5 .  arformoterol (BROVANA) 15 MCG/2ML NEBU, Take 2 mLs (15 mcg total) by nebulization 2 (two) times daily., Disp: 120 mL, Rfl: 11 .  Biotin 10 MG CAPS, Take 1 capsule by mouth daily., Disp: , Rfl:  .  budesonide (PULMICORT) 0.5 MG/2ML nebulizer solution, USE THE CONTENTS OF ONE VIAL (2ML) BY NEBULIZATION TWICE A DAY., Disp: 60 mL, Rfl: 5 .  buPROPion (WELLBUTRIN SR) 150 MG 12 hr tablet, Take 1 tablet (150 mg total) by mouth 2 (two) times daily., Disp: 180 tablet, Rfl: 1 .  Calcium Carb-Cholecalciferol (CALCIUM 600+D3) 600-200 MG-UNIT TABS, Take 1 tablet by mouth daily., Disp: , Rfl:  .  cholecalciferol (VITAMIN D) 1000 units tablet, Take 2,000 Units by mouth daily. , Disp: , Rfl:  .  CRANBERRY PO, Take 2 tablets by mouth every morning. , Disp: , Rfl:  .  estradiol (ESTRACE VAGINAL) 0.1 MG/GM vaginal cream, Apply 0.5mg  (pea-sized amount)  just inside the vaginal introitus with a finger-tip every night for two weeks and then Monday, Wednesday and Friday nights., Disp: 30 g, Rfl: 12 .  ezetimibe (ZETIA) 10 MG tablet, Take 1 tablet (10 mg total) by mouth daily., Disp: 90 tablet, Rfl: 1 .  losartan (COZAAR) 25 MG tablet, Take 1 tablet (25 mg total) by mouth daily., Disp: 90 tablet, Rfl: 1 .  metoprolol tartrate (LOPRESSOR) 25 MG tablet, Take 1 tablet (25  mg total) by mouth 2 (two) times daily., Disp: 180 tablet, Rfl: 1 .  omeprazole (PRILOSEC) 40 MG capsule, TAKE 1 CAPSULE (40 MG TOTAL) BY MOUTH DAILY AS NEEDED (HEARTBURN)., Disp: 30 capsule, Rfl: 1 .  pravastatin (PRAVACHOL) 80 MG tablet, Take 1 tablet (80 mg total) by mouth at bedtime., Disp: 90 tablet, Rfl: 1 .  psyllium (METAMUCIL) 58.6 % powder, Take 1 packet by mouth at bedtime. , Disp: , Rfl:  .  sertraline (ZOLOFT) 100 MG tablet, TAKE 1 TABLET EVERY MORNING AND 1/2 TABLET EVERY EVENING, Disp: 45 tablet, Rfl: 6 .  tiotropium (SPIRIVA HANDIHALER) 18 MCG  inhalation capsule, INHALE THE CONTENTS OF 1 CAPSULE ONE DAILY *NOT FOR ORAL USE*., Disp: 30 capsule, Rfl: 6   Allergies:  Keflex [cephalexin] and Penicillins  Review of Systems:  Constitutional: Feels well. Cardiovascular: Denies chest pain, exertional chest pain.  Pulmonary: Denies hemoptysis, pleuritic chest pain.   The remainder of systems were reviewed and were found to be negative other than what is documented in the HPI.    Physical Examination:   VS: BP 130/70 (BP Location: Left Arm, Cuff Size: Normal)   Pulse (!) 58   Resp 16   Ht 5\' 4"  (1.626 m)   Wt 122 lb (55.3 kg)   SpO2 94%   BMI 20.94 kg/m   General Appearance: No distress  Neuro:without focal findings, mental status, speech normal, alert and oriented HEENT: PERRLA, EOM intact Pulmonary: decreased air entry bilaterally.  CardiovascularNormal S1,S2.  No m/r/g.  Abdomen: Benign, Soft, non-tender, No masses Renal:  No costovertebral tenderness  GU:  No performed at this time. Endoc: No evident thyromegaly, no signs of acromegaly or Cushing features Skin:   warm, no rashes, no ecchymosis  Extremities: normal, no cyanosis, clubbing.    LABORATORY PANEL:   CBC No results for input(s): WBC, HGB, HCT, PLT in the last 168 hours. ------------------------------------------------------------------------------------------------------------------  Chemistries  No results for input(s): NA, K, CL, CO2, GLUCOSE, BUN, CREATININE, CALCIUM, MG, AST, ALT, ALKPHOS, BILITOT in the last 168 hours.  Invalid input(s): GFRCGP ------------------------------------------------------------------------------------------------------------------  Cardiac Enzymes No results for input(s): TROPONINI in the last 168 hours. ------------------------------------------------------------  RADIOLOGY:  No results found.     Thank  you for the consultation and for allowing [facility] to assist in the care of  your patient. Our recommendations are noted above.  Please contact us if we can be of further service.  Marda Stalker, M.D., F.C.C.P.  Board Certified in Internal Medicine, Pulmonary Medicine, [place], and Sleep Medicine.  [facility] Office Number: [phone]   04/04/2018

## 2018-04-05 ENCOUNTER — Ambulatory Visit (INDEPENDENT_AMBULATORY_CARE_PROVIDER_SITE_OTHER): Payer: PPO | Admitting: Internal Medicine

## 2018-04-05 ENCOUNTER — Encounter: Payer: Self-pay | Admitting: Internal Medicine

## 2018-04-05 VITALS — BP 130/70 | HR 58 | Resp 16 | Ht 64.0 in | Wt 122.0 lb

## 2018-04-05 DIAGNOSIS — J9611 Chronic respiratory failure with hypoxia: Secondary | ICD-10-CM | POA: Diagnosis not present

## 2018-04-05 DIAGNOSIS — J439 Emphysema, unspecified: Secondary | ICD-10-CM | POA: Diagnosis not present

## 2018-04-05 NOTE — Patient Instructions (Signed)
Continue current inhaled medications and oxygen.

## 2018-04-15 DIAGNOSIS — R0902 Hypoxemia: Secondary | ICD-10-CM | POA: Diagnosis not present

## 2018-04-15 DIAGNOSIS — J309 Allergic rhinitis, unspecified: Secondary | ICD-10-CM | POA: Diagnosis not present

## 2018-05-08 NOTE — Progress Notes (Signed)
I reviewed health advisor's note, was available for consultation, and agree with documentation and plan.  

## 2018-05-10 DIAGNOSIS — J449 Chronic obstructive pulmonary disease, unspecified: Secondary | ICD-10-CM | POA: Diagnosis not present

## 2018-05-11 ENCOUNTER — Other Ambulatory Visit: Payer: PPO

## 2018-05-11 ENCOUNTER — Telehealth: Payer: Self-pay | Admitting: Urology

## 2018-05-11 ENCOUNTER — Ambulatory Visit: Payer: PPO | Admitting: Urology

## 2018-05-11 DIAGNOSIS — Z87440 Personal history of urinary (tract) infections: Secondary | ICD-10-CM

## 2018-05-11 LAB — URINALYSIS, COMPLETE
Bilirubin, UA: NEGATIVE
GLUCOSE, UA: NEGATIVE
KETONES UA: NEGATIVE
Nitrite, UA: NEGATIVE
Protein, UA: NEGATIVE
RBC, UA: NEGATIVE
Specific Gravity, UA: 1.015 (ref 1.005–1.030)
Urobilinogen, Ur: 0.2 mg/dL (ref 0.2–1.0)
pH, UA: 7.5 (ref 5.0–7.5)

## 2018-05-11 LAB — MICROSCOPIC EXAMINATION
Bacteria, UA: NONE SEEN
RBC, UA: NONE SEEN /hpf (ref 0–2)

## 2018-05-11 NOTE — Telephone Encounter (Signed)
Pt came in to office today, thinking she had appt w/Shannon.  She said no one called her.  She insisted on leaving a urine, stating she felt like she had a UTI.  She is having frequency and burning.

## 2018-05-11 NOTE — Telephone Encounter (Signed)
Orders placed, will have Zara Council review.

## 2018-05-15 DIAGNOSIS — R0902 Hypoxemia: Secondary | ICD-10-CM | POA: Diagnosis not present

## 2018-05-15 DIAGNOSIS — J309 Allergic rhinitis, unspecified: Secondary | ICD-10-CM | POA: Diagnosis not present

## 2018-05-16 LAB — CULTURE, URINE COMPREHENSIVE

## 2018-05-18 ENCOUNTER — Telehealth: Payer: Self-pay

## 2018-05-18 NOTE — Telephone Encounter (Signed)
Patient notified

## 2018-05-18 NOTE — Telephone Encounter (Signed)
-----   Message from Nori Riis, PA-C sent at 05/18/2018  7:00 AM EST ----- Please let Mrs. Brickman know that her urine culture is negative.

## 2018-06-05 ENCOUNTER — Ambulatory Visit (INDEPENDENT_AMBULATORY_CARE_PROVIDER_SITE_OTHER)
Admission: RE | Admit: 2018-06-05 | Discharge: 2018-06-05 | Disposition: A | Discharge status: Home / Self Care | Payer: PPO | Source: Ambulatory Visit | Attending: Internal Medicine | Admitting: Internal Medicine

## 2018-06-05 ENCOUNTER — Encounter: Payer: Self-pay | Admitting: Internal Medicine

## 2018-06-05 ENCOUNTER — Other Ambulatory Visit: Payer: Self-pay | Admitting: Internal Medicine

## 2018-06-05 ENCOUNTER — Ambulatory Visit (INDEPENDENT_AMBULATORY_CARE_PROVIDER_SITE_OTHER): Payer: PPO | Admitting: Internal Medicine

## 2018-06-05 VITALS — BP 132/70 | HR 71 | Temp 97.6°F | Wt 118.0 lb

## 2018-06-05 DIAGNOSIS — R0602 Shortness of breath: Secondary | ICD-10-CM | POA: Diagnosis not present

## 2018-06-05 DIAGNOSIS — J449 Chronic obstructive pulmonary disease, unspecified: Secondary | ICD-10-CM | POA: Diagnosis not present

## 2018-06-05 DIAGNOSIS — R059 Cough, unspecified: Secondary | ICD-10-CM

## 2018-06-05 DIAGNOSIS — R05 Cough: Secondary | ICD-10-CM | POA: Diagnosis not present

## 2018-06-05 MED ORDER — PREDNISONE 10 MG PO TABS: ORAL_TABLET | ORAL | 0 refills | Status: DC

## 2018-06-05 NOTE — Patient Instructions (Addendum)

## 2018-06-05 NOTE — Progress Notes (Signed)
HPI  Pt presents to the clinic today with c/o runny nose, nasal congestion and cough. She reports this started 3 days ago. She is unable to blow anything out of her nose. The cough is productive of tan sputum. She denies ear pain or sore throat. She is chronically SOB, on continuous 3L of O2. She denies fever, chills or body aches. She denies nausea, vomiting or diarrhea. She has tried Mucinex, her inhalers, nebulizer's and Dramamine with some relief. She has a history of COPD and lung cancer s/p RLL lobectomy. She does not think she has had sick contacts.  Review of Systems      Past Medical History:  Diagnosis Date  . Chronic cystitis    recurrent UTI (4+ in last year), on suppressive doxy (uro Cope)  . Colon polyp   . 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 Depression with anxiety   . Diverticular disease 10/09/09   CT abd no sigmoid mass DDD L5/S1  . Dyslipidemia   . GERD (gastroesophageal reflux disease)   . Hematuria    h/o kidney stones  . Hiatal hernia   . Hypertension   . Left scapholunate ligament tear 2014   found on imaging  . Lung cancer ([place]) 1987   Partial RLL Lobectomy.  . Osteoarthritis   . Osteopenia 08/2011   DEXA: L femur -1.6  . Peptic ulcer   . Spinal abscess ([place]) 2000   secondary to periodontal disease    Family History  Problem Relation Age of Onset  . Pancreatitis Mother   . Heart disease Father   . Hypertension Father   . Emphysema Father   . Arthritis Brother   . Hypertension Brother   . Cancer Maternal Aunt        ovarian cancer  . Kidney disease Neg Hx   . Prostate cancer Neg Hx   . Bladder Cancer Neg Hx     Social History   Socioeconomic History  . Marital status: Married    Spouse name: Not on file  . Number of children: 2  . Years of education: Not on file  . Highest education level: Not on file  Occupational History  . Occupation: Retired    Fish farm manager: RETIRED    Comment: prior Glass blower/designer at [employer]  .  Financial resource strain: Not on file  . Food insecurity:    Worry: Not on file    Inability: Not on file  . Transportation needs:    Medical: Not on file    Non-medical: Not on file  Tobacco Use  . Smoking status: Former Smoker    Packs/day: 3.00    Years: 34.00    Pack years: 102.00    Types: Cigarettes    Last attempt to quit: 05/29/1995    Years since quitting: 23.0  . Smokeless tobacco: Never Used  Substance and Sexual Activity  . Alcohol use: Yes    Alcohol/week: 0.0 standard drinks    Comment: wine occasionally  . Drug use: No  . Sexual activity: Not Currently  Lifestyle  . Physical activity:    Days per week: Not on file    Minutes per session: Not on file  . Stress: Not on file  Relationships  . Social connections:    Talks on phone: Not on file    Gets together: Not on file    Attends religious service: Not on file    Active member of club or organization: Not  on file    Attends meetings of clubs or organizations: Not on file    Relationship status: Not on file  . Intimate partner violence:    Fear of current or ex partner: Not on file    Emotionally abused: Not on file    Physically abused: Not on file    Forced sexual activity: Not on file  Other Topics Concern  . Not on file  Social History Narrative   Caffeine: 4 cups coffee   Lives in independent living at [facility] for the third time, 2nd husband died of heart attack. Two stepsons. 3rd husband died summer 2018   Activity: Walks for exercise   Diet: good water, fruits/vegetables daily      Advanced directed - would want HCPOA to be husband, Karlton Lemon. Does not have set up - requests handout.    Allergies  Allergen Reactions  . Keflex [Cephalexin] Shortness Of Breath  . Penicillins     REACTION: hives     Constitutional: Positive fatigue. Denies headache, fever or abrupt weight changes.  HEENT:  Positive runny nose, nasal congestion. Denies eye redness, eye pain, pressure behind  the eyes, facial pain, ear pain, ringing in the ears, wax buildup, or bloody nose. Respiratory: Positive cough, shortness of breath. Denies difficulty breathing.  Cardiovascular: Denies chest pain, chest tightness, palpitations or swelling in the hands or feet.   No other specific complaints in a complete review of systems (except as listed in HPI above).  Objective:   BP 132/70   Pulse 71   Temp 97.6 F (36.4 C) (Oral)   Wt 118 lb (53.5 kg)   SpO2 96%   BMI 20.25 kg/m   Wt Readings from Last 3 Encounters:  06/05/18 118 lb (53.5 kg)  04/05/18 122 lb (55.3 kg)  03/22/18 121 lb (54.9 kg)     General: Appears her stated age, chronically ill appearing, in NAD. HEENT: Head: normal shape and size, no sinus tenderness; Nose: mucosa pink and moist, septum midline; Throat/Mouth: + PND. Teeth present, mucosa pink and moist, no exudate noted, no lesions or ulcerations noted.  Neck: No cervical lymphadenopathy.  Cardiovascular: Normal rate and rhythm. Distant S1,S2 noted.  No murmur, rubs or gallops noted.  Pulmonary/Chest: Normal effort with diminished breath sounds. No respiratory distress. No wheezes, rales or ronchi noted.       Assessment & Plan:   Cough, SOB, COPD:  Get some rest and drink plenty of water Continue inhalers, neb treatments and daily antihistamine Continue Mucinex 600 mg every 12 hours Chest xray today to r/o pneumonia No indication for steroids or antibiotics at this time unless chest xray shows infiltrates Delsym as needed for cough  RTC as needed or if symptoms persist.   Webb Silversmith, NP

## 2018-06-06 ENCOUNTER — Telehealth: Payer: Self-pay | Admitting: Family Medicine

## 2018-06-06 NOTE — Telephone Encounter (Signed)
Need a face to face note for 8.8.19 fax to [phone]

## 2018-06-07 NOTE — Telephone Encounter (Signed)
Faxed OV note to [facility], of [facility].

## 2018-06-12 NOTE — Progress Notes (Signed)
06/14/2018  3:31 PM   [name] [name] 1940-12-07 [id number]  Referring provider: Ria Bush, MD [address] [place], [place] [address]  Chief Complaint  Patient presents with  . Follow-up    urge incontinence    HPI: [name] [name] is a 78 y.o. female Caucasian who presents today for annual follow-up for vaginal atrophy, a history of recurrent urinary tract infections and urge incontinence.     Vaginal atrophy Patient is using her vaginal estrogen occasionally.  She has not had an UTI since 08/12/2016 with [place].  History of recurrent urinary tract infections Patient was referred to [place] by, Dr. Danise Mina, for recurrent urinary tract infections.  She does have a history of GU surgery (bladder sling with Dr. Jacqlyn Larsen 2005).  Reviewing her records,  she has had three infections.  Each positive for E. Coli.  KUB performed on 01/13/2016 noted moderately increased colonic stool burden reflecting constipation. No definite urinary tract stones were seen.   Renal ultrasound performed on 01/23/2016 noted no hydronephrosis or other acute abnormalities of the kidneys.   She has not had an UTI since her last visit with [place].   Urge incontinence On 05/11/2017, she reported that she was experiencing urgency x 0-3, frequency x 4-7, not restricting fluids to avoid visits to the restroom, was engaging in toilet mapping, incontinence x 4-7 and nocturia x 0-3.    Her BP was 176/74.   She was no longer taking the Myrbetriq as it was cost prohibitive.  She had actually noted improvement in her urinary symptoms over the last few months.    The patient is experiencing urgency x 0-3 (stable), frequency x 4-7 (stable), is restricting fluids to avoid visits to the restroom x 0-3, is engaging in toilet mapping, incontinence x 0-3 (improved) and nocturia x 0-3 (stable).   Her BP is 147/84.   Her PVR is 21 mL.  Patient denies vaginal burning and itching, infections.  She is using the vaginal cream  sparingly.  Patient is hopeful that treatment is being effective and has no desire to go back on Myrbetriq.  Patient notes that her appetite has not completely returned since she was widowed 11/2016; she lives in an apartment community with social opportunities that she enjoys.  She has not had an UTI, since her last visit with [place] one year ago.   PMH: Past Medical History:  Diagnosis Date  . Chronic cystitis    recurrent UTI (4+ in last year), on suppressive doxy (uro Cope)  . Colon polyp   . 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 Depression with anxiety   . Diverticular disease 10/09/09   CT abd no sigmoid mass DDD L5/S1  . Dyslipidemia   . GERD (gastroesophageal reflux disease)   . Hematuria    h/o kidney stones  . Hiatal hernia   . Hypertension   . Left scapholunate ligament tear 2014   found on imaging  . Lung cancer ([place]) 1987   Partial RLL Lobectomy.  . Osteoarthritis   . Osteopenia 08/2011   DEXA: L femur -1.6  . Peptic ulcer   . Spinal abscess ([place]) 2000   secondary to periodontal disease    Surgical History: Past Surgical History:  Procedure Laterality Date  . BLADDER SURGERY    . BREAST BIOPSY Left 03/99   neg  . CHOLECYSTECTOMY  02/10/09  . COLONOSCOPY WITH PROPOFOL N/A 04/30/2014   Procedure: COLONOSCOPY WITH PROPOFOL;  Surgeon: Irene Shipper, MD;  Location: [facility];  Service: Endoscopy;  Laterality: N/A;  . CYSTOSCOPY  02/11/07 and 09/25/08   Dr. Jacqlyn Larsen, normal  . DEXA  08/2011   T femur -1.6, T spine -0.6  . [place] pneumonia sepsis due to gum surgery  . OOPHORECTOMY    . REPLACEMENT TOTAL KNEE  09/2009   Right  . TOTAL ABDOMINAL HYSTERECTOMY  07/18/01   benign reason, BSO, vag repair secondary prolapse  . TUBAL LIGATION     bilateral    Home Medications:  Allergies as of 06/14/2018      Reactions   Keflex [cephalexin] Shortness Of Breath   Penicillins    REACTION: hives      Medication List       Accurate as of June 14, 2018  3:31 PM. Always use your most recent med list.        albuterol (2.5 MG/3ML) 0.083% nebulizer solution Commonly known as:  PROVENTIL Take every 4-6 hours as needed for shortness of breath   albuterol 108 (90 Base) MCG/ACT inhaler Commonly known as:  PROAIR HFA Inhale 2 puffs into the lungs every 4 (four) hours as needed.   arformoterol 15 MCG/2ML Nebu Commonly known as:  BROVANA Take 2 mLs (15 mcg total) by nebulization 2 (two) times daily.   Biotin 10 MG Caps Take 1 capsule by mouth daily.   budesonide 0.5 MG/2ML nebulizer solution Commonly known as:  PULMICORT USE THE CONTENTS OF ONE VIAL (2ML) BY NEBULIZATION TWICE A DAY.   buPROPion 150 MG 12 hr tablet Commonly known as:  WELLBUTRIN SR Take 1 tablet (150 mg total) by mouth 2 (two) times daily.   CALCIUM 600+D3 600-200 MG-UNIT Tabs Generic drug:  Calcium Carb-Cholecalciferol Take 1 tablet by mouth daily.   cholecalciferol 1000 units tablet Commonly known as:  VITAMIN D Take 2,000 Units by mouth daily.   CRANBERRY PO Take 2 tablets by mouth every morning.   estradiol 0.1 MG/GM vaginal cream Commonly known as:  ESTRACE VAGINAL Apply 0.5mg  (pea-sized amount)  just inside the vaginal introitus with a finger-tip every night for two weeks and then Monday, Wednesday and Friday nights.   ezetimibe 10 MG tablet Commonly known as:  ZETIA Take 1 tablet (10 mg total) by mouth daily.   losartan 25 MG tablet Commonly known as:  COZAAR Take 1 tablet (25 mg total) by mouth daily.   metoprolol tartrate 25 MG tablet Commonly known as:  LOPRESSOR Take 1 tablet (25 mg total) by mouth 2 (two) times daily.   omeprazole 40 MG capsule Commonly known as:  PRILOSEC TAKE 1 CAPSULE (40 MG TOTAL) BY MOUTH DAILY AS NEEDED (HEARTBURN).   pravastatin 80 MG tablet Commonly known as:  PRAVACHOL Take 1 tablet (80 mg total) by mouth at bedtime.   predniSONE 10 MG tablet Commonly known as:  DELTASONE Take 6 tabs day 1, 5 tabs  day 2, 4 tabs day 3, 3 tabs day 4, 2 tabs day 5, 1 tab day 6   psyllium 58.6 % powder Commonly known as:  METAMUCIL Take 1 packet by mouth at bedtime.   sertraline 100 MG tablet Commonly known as:  ZOLOFT TAKE 1 TABLET EVERY MORNING AND 1/2 TABLET EVERY EVENING   tiotropium 18 MCG inhalation capsule Commonly known as:  SPIRIVA HANDIHALER INHALE THE CONTENTS OF 1 CAPSULE ONE DAILY *NOT FOR ORAL USE*.       Allergies:  Allergies  Allergen Reactions  . Keflex [Cephalexin]  Shortness Of Breath  . Penicillins     REACTION: hives    Family History: Family History  Problem Relation Age of Onset  . Pancreatitis Mother   . Heart disease Father   . Hypertension Father   . Emphysema Father   . Arthritis Brother   . Hypertension Brother   . Cancer Maternal Aunt        ovarian cancer  . Kidney disease Neg Hx   . Prostate cancer Neg Hx   . Bladder Cancer Neg Hx     Social History:  reports that she quit smoking about 23 years ago. Her smoking use included cigarettes. She has a 102.00 pack-year smoking history. She has never used smokeless tobacco. She reports current alcohol use. She reports that she does not use drugs.  ROS: UROLOGY Frequent Urination?: No Hard to postpone urination?: No Burning/pain with urination?: No Get up at night to urinate?: No Leakage of urine?: No Urine stream starts and stops?: No Trouble starting stream?: No Do you have to strain to urinate?: No Blood in urine?: No Urinary tract infection?: No Sexually transmitted disease?: No Injury to kidneys or bladder?: No Painful intercourse?: No Weak stream?: No Currently pregnant?: No Vaginal bleeding?: No Last menstrual period?: n  Gastrointestinal Nausea?: No Vomiting?: No Indigestion/heartburn?: No Diarrhea?: No Constipation?: No  Constitutional Fever: No Night sweats?: No Weight loss?: No Fatigue?: No  Skin Skin rash/lesions?: No Itching?: No  Eyes Blurred vision?: No Double  vision?: No  Ears/Nose/Throat Sore throat?: No Sinus problems?: No  Hematologic/Lymphatic Swollen glands?: No Easy bruising?: No  Cardiovascular Leg swelling?: No Chest pain?: No  Respiratory Cough?: No Shortness of breath?: No  Endocrine Excessive thirst?: No  Musculoskeletal Back pain?: No Joint pain?: No  Neurological Headaches?: No Dizziness?: No  Psychologic Depression?: No Anxiety?: No  Physical Exam: BP (!) 147/84   Pulse 64   Ht 5\' 4"  (1.626 m)   Wt 118 lb (53.5 kg)   BMI 20.25 kg/m   Constitutional: Well nourished. Alert and oriented, No acute distress. Cardiovascular: No clubbing, cyanosis, or edema. Respiratory: Normal respiratory effort, no increased work of breathing. Skin: No rashes, bruises or suspicious lesions. Neurologic: Grossly intact, no focal deficits, moving all 4 extremities. Psychiatric: Normal mood and affect.   Laboratory Data: Lab Results  Component Value Date   WBC 5.5 12/29/2017   HGB 13.4 12/29/2017   HCT 40.4 12/29/2017   MCV 93.1 12/29/2017   PLT 163.0 12/29/2017    Lab Results  Component Value Date   CREATININE 0.76 12/29/2017     Lab Results  Component Value Date   TSH 1.20 12/29/2017       Component Value Date/Time   CHOL 153 12/29/2017 1219   HDL 70.90 12/29/2017 1219   CHOLHDL 2 12/29/2017 1219   VLDL 18.6 12/29/2017 1219   LDLCALC 64 12/29/2017 1219    Lab Results  Component Value Date   AST 16 12/29/2017   Lab Results  Component Value Date   ALT 11 12/29/2017     I have reviewed the labs.  Assessment & Plan:    1. History of recurrent UTI - Patient has not had a UTI since 08/12/2016  2. Vaginal atrophy - Patient will continue the vaginal estrogen cream - RTC in one year   3. Urge incontinence - Resolved  - RTC in one year for PVR and OAB questionnaire  No follow-ups on file.  These notes generated with voice recognition software. I apologize for  typographical  errors.  Zara Council, PA-C  [facility] [address], [place], [place] [address] [phone]  I, Adele Schilder, am acting as a Education administrator for [employer], PA-C.   I have reviewed the above documentation for accuracy and completeness, and I agree with the above.    Zara Council, PA-C

## 2018-06-14 ENCOUNTER — Encounter: Payer: Self-pay | Admitting: Urology

## 2018-06-14 ENCOUNTER — Ambulatory Visit: Payer: PPO | Admitting: Urology

## 2018-06-14 VITALS — BP 147/84 | HR 64 | Ht 64.0 in | Wt 118.0 lb

## 2018-06-14 DIAGNOSIS — N952 Postmenopausal atrophic vaginitis: Secondary | ICD-10-CM

## 2018-06-14 DIAGNOSIS — N3941 Urge incontinence: Secondary | ICD-10-CM

## 2018-06-14 DIAGNOSIS — Z87440 Personal history of urinary (tract) infections: Secondary | ICD-10-CM | POA: Diagnosis not present

## 2018-06-14 LAB — BLADDER SCAN AMB NON-IMAGING

## 2018-06-15 ENCOUNTER — Other Ambulatory Visit: Payer: Self-pay | Admitting: Family Medicine

## 2018-06-15 DIAGNOSIS — R0902 Hypoxemia: Secondary | ICD-10-CM | POA: Diagnosis not present

## 2018-06-15 DIAGNOSIS — J309 Allergic rhinitis, unspecified: Secondary | ICD-10-CM | POA: Diagnosis not present

## 2018-06-16 ENCOUNTER — Encounter: Payer: Self-pay | Admitting: Family Medicine

## 2018-06-16 ENCOUNTER — Ambulatory Visit (INDEPENDENT_AMBULATORY_CARE_PROVIDER_SITE_OTHER): Payer: PPO | Admitting: Family Medicine

## 2018-06-16 VITALS — BP 158/80 | HR 79 | Temp 97.8°F | Ht 64.0 in | Wt 118.5 lb

## 2018-06-16 DIAGNOSIS — J441 Chronic obstructive pulmonary disease with (acute) exacerbation: Secondary | ICD-10-CM | POA: Diagnosis not present

## 2018-06-16 DIAGNOSIS — R634 Abnormal weight loss: Secondary | ICD-10-CM | POA: Diagnosis not present

## 2018-06-16 MED ORDER — DOXYCYCLINE HYCLATE 100 MG PO TABS: 100.0000 mg | ORAL_TABLET | Freq: Two times a day (BID) | ORAL | 0 refills | Status: DC

## 2018-06-16 MED ORDER — PREDNISONE 20 MG PO TABS: ORAL_TABLET | ORAL | 0 refills | Status: DC

## 2018-06-16 NOTE — Assessment & Plan Note (Signed)
Mild weight loss in last month attributable to acute illness. They will continue to monitor this.

## 2018-06-16 NOTE — Progress Notes (Addendum)
BP (!) 158/80 (BP Location: Right Arm, Patient Position: Sitting, Cuff Size: Normal)   Pulse 79   Temp 97.8 F (36.6 C) (Oral)   Ht 5\' 4"  (1.626 m)   Wt 118 lb 8 oz (53.8 kg)   SpO2 95% Comment: 3 L, pulsating  BMI 20.34 kg/m    CC: cough Subjective:    [name], female    DOB: January 21, 1941, 78 y.o.   MRN: [id number]  HPI: [name] [name] is a 78 y.o. female presenting on 06/16/2018 for Cough (C/o productive cough with green mucous and sinus drainage. Was seen 06/05/18 for same sxs, treated with prednisone, which she has finished. States she is no better. Pt accompanied by her son, Aaron Edelman. )   Here with son [name]  1 mo h/o productive cough with increased sputum production, dyspnea, wheezing and sinus drainage. Some headache.   No fevers/chills. No ear or tooth pain, abd pain or nausea, bowel changes.   Seen 10d ago with dx cough in COPD treated with prednisone taper without significant improvement.   Ongoing weight loss noted - 3 lbs since last month. 30 lbs since 10/18/2016 - after husband passed away.     Relevant past medical, surgical, family and social history reviewed and updated as indicated. Interim medical history since our last visit reviewed. Allergies and medications reviewed and updated. Outpatient Medications Prior to Visit  Medication Sig Dispense Refill  . albuterol (PROAIR HFA) 108 (90 Base) MCG/ACT inhaler Inhale 2 puffs into the lungs every 4 (four) hours as needed. 1 Inhaler 11  . albuterol (PROVENTIL) (2.5 MG/3ML) 0.083% nebulizer solution Take every 4-6 hours as needed for shortness of breath 75 mL 5  . arformoterol (BROVANA) 15 MCG/2ML NEBU Take 2 mLs (15 mcg total) by nebulization 2 (two) times daily. 120 mL 11  . Biotin 10 MG CAPS Take 1 capsule by mouth daily.    . budesonide (PULMICORT) 0.5 MG/2ML nebulizer solution USE THE CONTENTS OF ONE VIAL (2ML) BY NEBULIZATION TWICE A DAY. 60 mL 5  . buPROPion (WELLBUTRIN SR) 150 MG 12 hr  tablet Take 1 tablet (150 mg total) by mouth 2 (two) times daily. 180 tablet 1  . Calcium Carb-Cholecalciferol (CALCIUM 600+D3) 600-200 MG-UNIT TABS Take 1 tablet by mouth daily.    . cholecalciferol (VITAMIN D) 1000 units tablet Take 2,000 Units by mouth daily.     Marland Kitchen CRANBERRY PO Take 2 tablets by mouth every morning.     Marland Kitchen estradiol (ESTRACE VAGINAL) 0.1 MG/GM vaginal cream Apply 0.5mg  (pea-sized amount)  just inside the vaginal introitus with a finger-tip every night for two weeks and then Monday, Wednesday and Friday nights. 30 g 12  . ezetimibe (ZETIA) 10 MG tablet TAKE 1 TABLET BY MOUTH EVERY DAY 90 tablet 3  . losartan (COZAAR) 25 MG tablet Take 1 tablet (25 mg total) by mouth daily. 90 tablet 1  . metoprolol tartrate (LOPRESSOR) 25 MG tablet Take 1 tablet (25 mg total) by mouth 2 (two) times daily. 180 tablet 1  . omeprazole (PRILOSEC) 40 MG capsule TAKE 1 CAPSULE (40 MG TOTAL) BY MOUTH DAILY AS NEEDED (HEARTBURN). 30 capsule 1  . pravastatin (PRAVACHOL) 80 MG tablet Take 1 tablet (80 mg total) by mouth at bedtime. 90 tablet 1  . psyllium (METAMUCIL) 58.6 % powder Take 1 packet by mouth at bedtime.     . sertraline (ZOLOFT) 100 MG tablet TAKE 1 TABLET EVERY MORNING AND 1/2 TABLET EVERY EVENING 45  tablet 6  . tiotropium (SPIRIVA HANDIHALER) 18 MCG inhalation capsule INHALE THE CONTENTS OF 1 CAPSULE ONE DAILY *NOT FOR ORAL USE*. 30 capsule 6  . predniSONE (DELTASONE) 10 MG tablet Take 6 tabs day 1, 5 tabs day 2, 4 tabs day 3, 3 tabs day 4, 2 tabs day 5, 1 tab day 6 21 tablet 0   No facility-administered medications prior to visit.      Per HPI unless specifically indicated in ROS section below Review of Systems Objective:    BP (!) 158/80 (BP Location: Right Arm, Patient Position: Sitting, Cuff Size: Normal)   Pulse 79   Temp 97.8 F (36.6 C) (Oral)   Ht 5\' 4"  (1.626 m)   Wt 118 lb 8 oz (53.8 kg)   SpO2 95% Comment: 3 L, pulsating  BMI 20.34 kg/m   Wt Readings from Last 3  Encounters:  06/16/18 118 lb 8 oz (53.8 kg)  06/14/18 118 lb (53.5 kg)  06/05/18 118 lb (53.5 kg)    Physical Exam Vitals signs and nursing note reviewed.  Constitutional:      General: She is not in acute distress.    Appearance: Normal appearance. She is well-developed.     Comments: Tired appearing  HENT:     Head: Normocephalic and atraumatic.     Right Ear: Hearing, tympanic membrane, ear canal and external ear normal.     Left Ear: Hearing, tympanic membrane, ear canal and external ear normal.     Nose: Nose normal. No mucosal edema or rhinorrhea.     Right Sinus: No maxillary sinus tenderness or frontal sinus tenderness.     Left Sinus: No maxillary sinus tenderness or frontal sinus tenderness.     Mouth/Throat:     Mouth: Mucous membranes are moist.     Pharynx: Oropharynx is clear. Uvula midline. No oropharyngeal exudate or posterior oropharyngeal erythema.     Tonsils: No tonsillar abscesses.  Eyes:     General: No scleral icterus.    Conjunctiva/sclera: Conjunctivae normal.     Pupils: Pupils are equal, round, and reactive to light.  Neck:     Musculoskeletal: Normal range of motion and neck supple.  Cardiovascular:     Rate and Rhythm: Normal rate and regular rhythm.     Pulses: Normal pulses.     Heart sounds: Normal heart sounds. No murmur.  Pulmonary:     Effort: Pulmonary effort is normal. No respiratory distress.     Breath sounds: Decreased air movement present. Wheezing (diffuse throughout) present. No rhonchi or rales.     Comments: [place] in place. Normal work of breathing.  Lymphadenopathy:     Cervical: No cervical adenopathy.  Skin:    General: Skin is warm and dry.     Findings: No rash.  Neurological:     Mental Status: She is alert.   DG Chest 2 View CLINICAL DATA:  Cough and shortness of breath  EXAM: CHEST - 2 VIEW  COMPARISON:  November 28, 2013 and July 01, 2016  FINDINGS: No pneumothorax. The heart, hila, and mediastinum are  normal. Nodular density over the lateral left upper lung is unchanged since 2015. A CT scan from June of 2016 demonstrated this was associated with a healed rib fracture. No other nodules or masses on the left. There is a rounded density over the right base favored to represent a nipple shadow and/or confluence of shadows. No other nodules. No masses. Hyperinflation of the lungs consistent with COPD/emphysema. Pleural  thickening versus tiny pleural effusions. No other acute abnormalities.  IMPRESSION: 1. A rounded density over the lateral right lung base is favored to represent a nipple shadow and/or confluence of shadows. Recommend a repeat study with nipple markers for confirmation. 2. Emphysema/COPD with hyperinflation of the lungs. 3. Pleural thickening versus tiny pleural effusions, unchanged since 2015.  Electronically Signed   By: Dorise Bullion III M.D   On: 06/05/2018 14:02      Assessment & Plan:   Problem List Items Addressed This Visit    Weight loss, unintentional    Mild weight loss in last month attributable to acute illness. They will continue to monitor this.      COPD exacerbation ([place]) - Primary    Persistent acute COPD flare not improved with initial prednisone taper. Will Rx doxycycline and repeat prednisone course. Update if not improving with treatment. Pt agrees with plan.       Relevant Medications   predniSONE (DELTASONE) 20 MG tablet       Meds ordered this encounter  Medications  . doxycycline (VIBRA-TABS) 100 MG tablet    Sig: Take 1 tablet (100 mg total) by mouth 2 (two) times daily.    Dispense:  20 tablet    Refill:  0  . predniSONE (DELTASONE) 20 MG tablet    Sig: Take two tablets daily for 4 days followed by one tablet daily for 4 days    Dispense:  12 tablet    Refill:  0   No orders of the defined types were placed in this encounter.   Patient Instructions  I think you have COPD flare. Treat with another prednisone taper as  well as doxycycline antibiotic sent to pharmacy. Schedule albuterol nebulizer every 6-8 hours for next 2-3 days then as needed.  Let us know if fever >101, worsening productive cough or not improving with treatment.    Follow up plan: Return if symptoms worsen or fail to improve.  Ria Bush, MD

## 2018-06-16 NOTE — Patient Instructions (Addendum)
I think you have COPD flare. Treat with another prednisone taper as well as doxycycline antibiotic sent to pharmacy. Schedule albuterol nebulizer every 6-8 hours for next 2-3 days then as needed.  Let us know if fever >101, worsening productive cough or not improving with treatment.

## 2018-06-16 NOTE — Assessment & Plan Note (Signed)
Persistent acute COPD flare not improved with initial prednisone taper. Will Rx doxycycline and repeat prednisone course. Update if not improving with treatment. Pt agrees with plan.

## 2018-06-27 ENCOUNTER — Other Ambulatory Visit: Payer: Self-pay

## 2018-06-27 MED ORDER — SERTRALINE HCL 100 MG PO TABS: ORAL_TABLET | ORAL | 2 refills | Status: DC

## 2018-06-27 NOTE — Telephone Encounter (Signed)
E-scribed refill 

## 2018-07-04 ENCOUNTER — Other Ambulatory Visit: Payer: Self-pay | Admitting: Family Medicine

## 2018-07-16 DIAGNOSIS — J309 Allergic rhinitis, unspecified: Secondary | ICD-10-CM | POA: Diagnosis not present

## 2018-07-16 DIAGNOSIS — R0902 Hypoxemia: Secondary | ICD-10-CM | POA: Diagnosis not present

## 2018-08-14 DIAGNOSIS — J309 Allergic rhinitis, unspecified: Secondary | ICD-10-CM | POA: Diagnosis not present

## 2018-08-14 DIAGNOSIS — R0902 Hypoxemia: Secondary | ICD-10-CM | POA: Diagnosis not present

## 2018-08-18 ENCOUNTER — Other Ambulatory Visit: Payer: Self-pay | Admitting: Family Medicine

## 2018-08-28 ENCOUNTER — Other Ambulatory Visit: Payer: Self-pay | Admitting: Internal Medicine

## 2018-09-12 ENCOUNTER — Other Ambulatory Visit: Payer: Self-pay | Admitting: Family Medicine

## 2018-09-14 DIAGNOSIS — R0902 Hypoxemia: Secondary | ICD-10-CM | POA: Diagnosis not present

## 2018-09-14 DIAGNOSIS — J309 Allergic rhinitis, unspecified: Secondary | ICD-10-CM | POA: Diagnosis not present

## 2018-09-20 ENCOUNTER — Other Ambulatory Visit: Payer: Self-pay | Admitting: Family Medicine

## 2018-09-21 ENCOUNTER — Other Ambulatory Visit: Payer: Self-pay | Admitting: Family Medicine

## 2018-09-22 DIAGNOSIS — L853 Xerosis cutis: Secondary | ICD-10-CM | POA: Diagnosis not present

## 2018-09-22 DIAGNOSIS — L7211 Pilar cyst: Secondary | ICD-10-CM | POA: Diagnosis not present

## 2018-10-14 ENCOUNTER — Other Ambulatory Visit: Payer: Self-pay | Admitting: Family Medicine

## 2018-10-14 DIAGNOSIS — J309 Allergic rhinitis, unspecified: Secondary | ICD-10-CM | POA: Diagnosis not present

## 2018-10-14 DIAGNOSIS — R0902 Hypoxemia: Secondary | ICD-10-CM | POA: Diagnosis not present

## 2018-11-09 ENCOUNTER — Ambulatory Visit: Payer: Self-pay | Admitting: *Deleted

## 2018-11-09 NOTE — Telephone Encounter (Signed)
Patient states that she was notified on today that her hairdresser was positive for COVID-19. Pt states that she was in contact with there hairdresser last Friday. Pt denies any symptoms at this time. Pt has a history of COPD.Pt advised that she would need a virtual visit with PCP for recommendations on testing.Pt given home care advice and advised to monitor symptoms within 14 days of exposure. Pt advised to return call to office if symptoms develop. Pt verbalized understanding. Pt can be contacted at [phone].  Reason for Disposition . [1] COVID-19 EXPOSURE (Close Contact) AND [2] within last 14 days BUT [3] NO symptoms  Answer Assessment - Initial Assessment Questions 1. CLOSE CONTACT: "Who is the person with the confirmed or suspected COVID-19 infection that you were exposed to?"     Hairdrsser 2. PLACE of CONTACT: "Where were you when you were exposed to COVID-19?" (e.g., home, school, medical waiting room; which city?)     At the salon 3. TYPE of CONTACT: "How much contact was there?" (e.g., sitting next to, live in same house, work in same office, same building)     Contact during appt with hairdresser 4. DURATION of CONTACT: "How long were you in contact with the COVID-19 patient?" (e.g., a few seconds, passed by person, a few minutes, live with the patient)    Close contact 5. DATE of CONTACT: "When did you have contact with a COVID-19 patient?" (e.g., how many days ago)     Last Friday 6. TRAVEL: "Have you traveled out of the country recently?" If so, "When and where?"     * Also ask about out-of-state travel, since the CDC has identified some high-risk cities for community spread in the [place].     * Note: Travel becomes less relevant if there is widespread community transmission where the patient lives.     No 7. COMMUNITY SPREAD: "Are there lots of cases of COVID-19 (community spread) where you live?" (See public health department website, if unsure)       Ye 8. SYMPTOMS: "Do you  have any symptoms?" (e.g., fever, cough, breathing difficulty)    Fatigue and headache but that is gone at this tie 9. PREGNANCY OR POSTPARTUM: "Is there any chance you are pregnant?" "When was your last menstrual period?" "Did you deliver in the last 2 weeks?"     n/a 10. HIGH RISK: "Do you have any heart or lung problems? Do you have a weak immune system?" (e.g., CHF, COPD, asthma, HIV positive, chemotherapy, renal failure, diabetes mellitus, sickle cell anemia) COPD  Protocols used: CORONAVIRUS (COVID-19) EXPOSURE-A-AH

## 2018-11-10 ENCOUNTER — Encounter: Payer: Self-pay | Admitting: Family Medicine

## 2018-11-10 ENCOUNTER — Ambulatory Visit (INDEPENDENT_AMBULATORY_CARE_PROVIDER_SITE_OTHER): Payer: PPO | Admitting: Family Medicine

## 2018-11-10 ENCOUNTER — Telehealth: Payer: Self-pay | Admitting: *Deleted

## 2018-11-10 DIAGNOSIS — Z20828 Contact with and (suspected) exposure to other viral communicable diseases: Secondary | ICD-10-CM

## 2018-11-10 DIAGNOSIS — Z20822 Contact with and (suspected) exposure to covid-19: Secondary | ICD-10-CM

## 2018-11-10 NOTE — Assessment & Plan Note (Signed)
Largely asxs at this time. Recommend home quarantine for 2 weeks from last date of exposure (so 1 more week from today). Reviewed symptoms suggestive of Covid infection, advised to call us right away if any symptoms develop.  Given comorbidities, did recommend testing - will send contact info to [facility] call center. Pt agrees with plan.

## 2018-11-10 NOTE — Telephone Encounter (Addendum)
Dr Darnell Level said he would do virtual appt today at 4 PM which pt scheduled . No fever, chills,S/T,muscle pain,diarrhea,H/A and no loss of taste/smell. Pt has slight prod cough with brownish phlegm, SOB as usual with COPD. No travel.FYI to Dr Darnell Level.

## 2018-11-10 NOTE — Telephone Encounter (Signed)
Notified pt for scheduling of covid-19 test. Advise that this is a drive thru test site, and she should stay in the car with masks on and windows rolled up until time for testing. She is scheduled for 9:45 am at [facility] in [place]. Pt voiced understanding.

## 2018-11-10 NOTE — Telephone Encounter (Signed)
-----   Message from Ria Bush, MD sent at 11/10/2018  4:26 PM EDT ----- Regarding: Covid testing I would like to have patient tested - COPD hx, COVID19 exposure on 11/03/2018. Thank you, Garlon Hatchet

## 2018-11-10 NOTE — Progress Notes (Signed)
Virtual visit attempted through [employer]. Due to national recommendations of social distancing due to COVID-19, a virtual visit is felt to be most appropriate for this patient at this time. Reviewed limitations of a virtual visit. Interactive audio and video telecommunications were attempted between myself and ALLE DIFABIO, however failed due to patient having technical difficulties.  We continued and completed visit with audio only.  Time: 4:17pm - 4:26pm   Patient location: home  Provider location:  at [facility], office  If any vitals were documented, they were collected by patient at home unless specified below.    BP (!) 156/89 (BP Location: Left Arm, Patient Position: Sitting)   Pulse 66   Temp (!) 97 F (36.1 C)   Ht 5\' 4"  (1.626 m)   Wt 120 lb (54.4 kg)   BMI 20.60 kg/m    CC: covid19 exposure Subjective:    [name], female    DOB: 1940-08-10, 78 y.o.   MRN: [id number]  HPI: [name] [name] is a 78 y.o. female presenting on 11/10/2018 for COVID-19 Exposure (C/o COVID-19 exposure.  States she found out 11/09/18 that her hairdresser tested positive.  Pt went to see her on 11/03/18.  Pt asx. )   Went to [place] [facility] on 11/03/2018. Just found out hairdresser tested positive for [place].  Pt did not have mask, hairdresser did have mask on.   Some chronic dyspnea, known O2 dependent COPD well controlled on respiratory regimen. No fevers/chills, cough, dyspnea, ST, HA, new body aches, nausea, or diarrhea. No loss of taste or smell.       Relevant past medical, surgical, family and social history reviewed and updated as indicated. Interim medical history since our last visit reviewed. Allergies and medications reviewed and updated. Outpatient Medications Prior to Visit  Medication Sig Dispense Refill  . albuterol (PROAIR HFA) 108 (90 Base) MCG/ACT inhaler Inhale 2 puffs into the lungs every 4 (four) hours as needed. 1 Inhaler 11  . albuterol  (PROVENTIL) (2.5 MG/3ML) 0.083% nebulizer solution Take every 4-6 hours as needed for shortness of breath 75 mL 5  . arformoterol (BROVANA) 15 MCG/2ML NEBU Take 2 mLs (15 mcg total) by nebulization 2 (two) times daily. 120 mL 11  . Biotin 10 MG CAPS Take 1 capsule by mouth daily.    . budesonide (PULMICORT) 0.5 MG/2ML nebulizer solution USE THE CONTENTS OF ONE VIAL (2ML) BY NEBULIZATION TWICE A DAY. 60 mL 5  . buPROPion (WELLBUTRIN SR) 150 MG 12 hr tablet TAKE 1 TABLET BY MOUTH TWICE A DAY 180 tablet 1  . Calcium Carb-Cholecalciferol (CALCIUM 600+D3) 600-200 MG-UNIT TABS Take 1 tablet by mouth daily.    . cholecalciferol (VITAMIN D) 1000 units tablet Take 2,000 Units by mouth daily.     Marland Kitchen CRANBERRY PO Take 2 tablets by mouth every morning.     Marland Kitchen doxycycline (VIBRA-TABS) 100 MG tablet Take 1 tablet (100 mg total) by mouth 2 (two) times daily. 20 tablet 0  . estradiol (ESTRACE VAGINAL) 0.1 MG/GM vaginal cream Apply 0.5mg  (pea-sized amount)  just inside the vaginal introitus with a finger-tip every night for two weeks and then Monday, Wednesday and Friday nights. 30 g 12  . ezetimibe (ZETIA) 10 MG tablet TAKE 1 TABLET BY MOUTH EVERY DAY 90 tablet 3  . losartan (COZAAR) 25 MG tablet TAKE 1 TABLET BY MOUTH EVERY DAY 90 tablet 2  . metoprolol tartrate (LOPRESSOR) 25 MG tablet TAKE 1 TABLET BY MOUTH TWICE A  DAY 180 tablet 1  . omeprazole (PRILOSEC) 40 MG capsule TAKE 1 CAPSULE (40 MG TOTAL) BY MOUTH DAILY AS NEEDED (HEARTBURN). 30 capsule 1  . pravastatin (PRAVACHOL) 80 MG tablet TAKE 1 TABLET BY MOUTH EVERYDAY AT BEDTIME 90 tablet 1  . predniSONE (DELTASONE) 20 MG tablet Take two tablets daily for 4 days followed by one tablet daily for 4 days 12 tablet 0  . psyllium (METAMUCIL) 58.6 % powder Take 1 packet by mouth at bedtime.     . sertraline (ZOLOFT) 100 MG tablet TAKE 1 TABLET EVERY MORNING AND 1/2 TABLET EVERY EVENING 135 tablet 1  . tiotropium (SPIRIVA) 18 MCG inhalation capsule INHALE THE CONTENTS  OF 1 CAPSULE ONE DAILY *NOT FOR ORAL USE*. 90 capsule 2   No facility-administered medications prior to visit.      Per HPI unless specifically indicated in ROS section below Review of Systems Objective:    BP (!) 156/89 (BP Location: Left Arm, Patient Position: Sitting)   Pulse 66   Temp (!) 97 F (36.1 C)   Ht 5\' 4"  (1.626 m)   Wt 120 lb (54.4 kg)   BMI 20.60 kg/m   Wt Readings from Last 3 Encounters:  11/10/18 120 lb (54.4 kg)  06/16/18 118 lb 8 oz (53.8 kg)  06/14/18 118 lb (53.5 kg)     Physical exam: Gen: alert, NAD, not ill appearing Pulm: speaks in complete sentences without increased work of breathing Psych: normal mood, normal thought content      Results for orders placed or performed in visit on 06/14/18  BLADDER SCAN AMB NON-IMAGING  Result Value Ref Range   Scan Result 108ml    Assessment & Plan:   Problem List Items Addressed This Visit    Exposure to Covid-19 Virus    Largely asxs at this time. Recommend home quarantine for 2 weeks from last date of exposure (so 1 more week from today). Reviewed symptoms suggestive of Covid infection, advised to call us right away if any symptoms develop.  Given comorbidities, did recommend testing - will send contact info to [facility] call center. Pt agrees with plan.           No orders of the defined types were placed in this encounter.  No orders of the defined types were placed in this encounter.   I discussed the assessment and treatment plan with the patient. The patient was provided an opportunity to ask questions and all were answered. The patient agreed with the plan and demonstrated an understanding of the instructions. The patient was advised to call back or seek an in-person evaluation if the symptoms worsen or if the condition fails to improve as anticipated.  Follow up plan: No follow-ups on file.  Ria Bush, MD

## 2018-11-13 ENCOUNTER — Other Ambulatory Visit: Payer: PPO

## 2018-11-13 DIAGNOSIS — Z20822 Contact with and (suspected) exposure to covid-19: Secondary | ICD-10-CM

## 2018-11-13 DIAGNOSIS — R6889 Other general symptoms and signs: Secondary | ICD-10-CM | POA: Diagnosis not present

## 2018-11-14 ENCOUNTER — Telehealth: Payer: Self-pay

## 2018-11-14 DIAGNOSIS — J309 Allergic rhinitis, unspecified: Secondary | ICD-10-CM | POA: Diagnosis not present

## 2018-11-14 DIAGNOSIS — R0902 Hypoxemia: Secondary | ICD-10-CM | POA: Diagnosis not present

## 2018-11-14 NOTE — Telephone Encounter (Signed)
Spoke with pt asking how she is feeling.  Say she is doing fine. Denies any sxs. Temp is 97.7 today and has been in that range about a wk.    Pt mentioned she has some irritation around her nose from her nasal cannula.

## 2018-11-15 NOTE — Telephone Encounter (Signed)
May try vaseline if oxygen is drying out nose.

## 2018-11-16 DIAGNOSIS — J449 Chronic obstructive pulmonary disease, unspecified: Secondary | ICD-10-CM | POA: Diagnosis not present

## 2018-11-16 NOTE — Telephone Encounter (Signed)
Spoke with pt relaying Dr. G's message. Pt verbalizes understanding.  

## 2018-11-17 ENCOUNTER — Telehealth: Payer: Self-pay | Admitting: *Deleted

## 2018-11-17 NOTE — Telephone Encounter (Signed)
Results aren't back yet. Can we check with the lab about results? Should have been back by now.

## 2018-11-17 NOTE — Telephone Encounter (Signed)
Spoke with pt relaying Dr. G's message. Pt verbalizes understanding.  

## 2018-11-17 NOTE — Telephone Encounter (Signed)
Patient left a voicemail wanting to know if her Covid test results are back? Patient stated that she is anxious for her result.

## 2018-11-17 NOTE — Telephone Encounter (Signed)
plz notify patient - [name] [name] checked with lab, results not back yet. Hopefully she will receive a call tomorrow when results are expected. If not let us know on Monday

## 2018-11-18 LAB — NOVEL CORONAVIRUS, NAA: SARS-CoV-2, NAA: NOT DETECTED

## 2018-12-12 DIAGNOSIS — J449 Chronic obstructive pulmonary disease, unspecified: Secondary | ICD-10-CM | POA: Diagnosis not present

## 2018-12-14 DIAGNOSIS — J309 Allergic rhinitis, unspecified: Secondary | ICD-10-CM | POA: Diagnosis not present

## 2018-12-14 DIAGNOSIS — R0902 Hypoxemia: Secondary | ICD-10-CM | POA: Diagnosis not present

## 2018-12-26 DIAGNOSIS — L853 Xerosis cutis: Secondary | ICD-10-CM | POA: Diagnosis not present

## 2018-12-26 DIAGNOSIS — L7211 Pilar cyst: Secondary | ICD-10-CM | POA: Diagnosis not present

## 2019-01-05 ENCOUNTER — Telehealth: Payer: Self-pay

## 2019-01-05 ENCOUNTER — Encounter: Payer: Self-pay | Admitting: Family Medicine

## 2019-01-05 DIAGNOSIS — I739 Peripheral vascular disease, unspecified: Secondary | ICD-10-CM | POA: Insufficient documentation

## 2019-01-05 NOTE — Telephone Encounter (Signed)
Noted. As asymptomatic, will await final report from [place].

## 2019-01-05 NOTE — Telephone Encounter (Signed)
[facility] Night - Client TELEPHONE ADVICE RECORD [facility] Patient Name: [name] [name] Gender: Female DOB: 10-30-1940 Age: 78 Y 9 M 12 D Return Phone Number: Address: City/State/Zip: [place] Client [place] Night - Client Client Site [place] Physician Ria Bush - MD Contact Type Call Who Is Calling Lab Lab Name [facility] Calls Lab Phone Number [phone] Lab Tech Name NP Collie Siad Lab Reference Number Chief Complaint Lab Result (Critical or Stat) Call Type Lab Send to RN Reason for Call Report lab results Initial Comment NP Collie Siad with [facility] at [phone]. Caller is reporting significant finding. Additional Comment Translation No Nurse Assessment Guidelines Guideline Title Affirmed Question Affirmed Notes Nurse Date/Time (Eastern Time) Disp. Time [name] Time) Disposition Final User 01/04/2019 5:18:45 PM Called On-Call Provider Sarina Ill, RN, [place] 01/04/2019 5:21:34 PM Lab Call Sarina Ill, RN, [place] Reason: NP called in PAD screening results of restricted flow both legs, no symptoms, no claudication when ambulatory. Stated required to call in positive results. Non emergent. MD notified and will receive report when completed by NP. 01/04/2019 5:21:42 PM Clinical Call Yes Sarina Ill, RN, [facility] Phone DateTime Result/Outcome Message Type Notes Thersa Salt- MD [phone] 01/04/2019 5:18:45 PM Called On Call Provider - Reached Doctor Paged Thersa Salt- MD 01/04/2019 5:19:31 PM Spoke with On Call - General Message Result Non emergent positive finding on PAD screening in the home. Will follow up when report received. PLEASE NOTE: All timestamps contained within this report are represented as [place] Standard Time. CONFIDENTIALTY NOTICE: This fax transmission is intended only for the addressee. It contains information that is legally  privileged, confidential or otherwise protected from use or disclosure. If you are not the intended recipient, you are strictly prohibited from reviewing, disclosing, copying using or disseminating any of this information or taking any action in reliance on or regarding this information. If you have received this fax in error, please notify us immediately by telephone so that we can arrange for its return to [place]. Phone: [phone], Toll-Free: [phone], Fax: [phone] Page: 2 of 2 Call Id:

## 2019-01-10 DIAGNOSIS — R208 Other disturbances of skin sensation: Secondary | ICD-10-CM | POA: Diagnosis not present

## 2019-01-10 DIAGNOSIS — L7211 Pilar cyst: Secondary | ICD-10-CM | POA: Diagnosis not present

## 2019-01-14 DIAGNOSIS — J309 Allergic rhinitis, unspecified: Secondary | ICD-10-CM | POA: Diagnosis not present

## 2019-01-14 DIAGNOSIS — R0902 Hypoxemia: Secondary | ICD-10-CM | POA: Diagnosis not present

## 2019-01-25 ENCOUNTER — Other Ambulatory Visit: Payer: Self-pay | Admitting: Family Medicine

## 2019-02-14 ENCOUNTER — Other Ambulatory Visit: Payer: Self-pay | Admitting: Family Medicine

## 2019-02-14 DIAGNOSIS — J309 Allergic rhinitis, unspecified: Secondary | ICD-10-CM | POA: Diagnosis not present

## 2019-02-14 DIAGNOSIS — R0902 Hypoxemia: Secondary | ICD-10-CM | POA: Diagnosis not present

## 2019-02-15 ENCOUNTER — Other Ambulatory Visit: Payer: Self-pay | Admitting: Family Medicine

## 2019-03-05 ENCOUNTER — Other Ambulatory Visit: Payer: Self-pay

## 2019-03-05 ENCOUNTER — Ambulatory Visit (INDEPENDENT_AMBULATORY_CARE_PROVIDER_SITE_OTHER): Payer: PPO | Admitting: Family Medicine

## 2019-03-05 ENCOUNTER — Encounter: Payer: Self-pay | Admitting: Family Medicine

## 2019-03-05 VITALS — BP 148/72 | HR 62 | Temp 97.1°F | Resp 20 | Wt 119.5 lb

## 2019-03-05 DIAGNOSIS — Z23 Encounter for immunization: Secondary | ICD-10-CM | POA: Diagnosis not present

## 2019-03-05 DIAGNOSIS — I739 Peripheral vascular disease, unspecified: Secondary | ICD-10-CM

## 2019-03-05 DIAGNOSIS — K529 Noninfective gastroenteritis and colitis, unspecified: Secondary | ICD-10-CM | POA: Diagnosis not present

## 2019-03-05 MED ORDER — SERTRALINE HCL 100 MG PO TABS: 100.0000 mg | ORAL_TABLET | Freq: Every day | ORAL | 1 refills | Status: DC

## 2019-03-05 NOTE — Progress Notes (Signed)
This visit was conducted in person.  BP (!) 148/72   Pulse 62   Temp (!) 97.1 F (36.2 C)   Resp 20   Wt 119 lb 8 oz (54.2 kg)   SpO2 98% Comment: on 3 liters of O2  BMI 20.51 kg/m    CC: discuss PAD Subjective:    [name], female    DOB: Jun 30, 1940, 78 y.o.   MRN: [id number]  HPI: [name] [name] is a 78 y.o. female presenting on 03/05/2019 for Discuss arterial disease and Diarrhea (x 1 year)   Received call from insurance NP 12/2018 about positive PAD screen performed at home - positive for restricted blood flow to both legs. Patient denied symptoms of claudication with exertion. Reviewed [facility] records showing ABI: L severe 0.19, R significant 0.52. She continues to deny any leg pain or claudication symptoms, or open sores on feet or legs.   Diarrhea - present for the past year. Loose and watery stools. 1-2 stools a day. Worse with certain foods but unsure which specifically. Mainly daytime symptoms. Rare stool accidents. Had severe LLQ pain last week, led to vomiting and she felt better. No fevers/chills, other nausea/vomiting, blood or mucous in the stool, no urinary symptoms. She has recently restarted metamucil to see if that would help. No unexpected weight loss.   She is on zetia and pravastatin 80mg .  Lab Results  Component Value Date   CHOL 153 12/29/2017   HDL 70.90 12/29/2017   LDLCALC 64 12/29/2017   LDLDIRECT 135.9 09/06/2011   TRIG 93.0 12/29/2017   CHOLHDL 2 12/29/2017        Relevant past medical, surgical, family and social history reviewed and updated as indicated. Interim medical history since our last visit reviewed. Allergies and medications reviewed and updated. Outpatient Medications Prior to Visit  Medication Sig Dispense Refill  . albuterol (PROAIR HFA) 108 (90 Base) MCG/ACT inhaler Inhale 2 puffs into the lungs every 4 (four) hours as needed. 1 Inhaler 11  . albuterol (PROVENTIL) (2.5 MG/3ML) 0.083% nebulizer solution  Take every 4-6 hours as needed for shortness of breath 75 mL 5  . arformoterol (BROVANA) 15 MCG/2ML NEBU Take 2 mLs (15 mcg total) by nebulization 2 (two) times daily. 120 mL 11  . Biotin 10 MG CAPS Take 1 capsule by mouth daily.    . budesonide (PULMICORT) 0.5 MG/2ML nebulizer solution USE THE CONTENTS OF ONE VIAL (2ML) BY NEBULIZATION TWICE A DAY. 60 mL 5  . buPROPion (WELLBUTRIN SR) 150 MG 12 hr tablet TAKE 1 TABLET BY MOUTH TWICE A DAY 180 tablet 0  . Calcium Carb-Cholecalciferol (CALCIUM 600+D3) 600-200 MG-UNIT TABS Take 1 tablet by mouth daily.    . cholecalciferol (VITAMIN D) 1000 units tablet Take 2,000 Units by mouth daily.     Marland Kitchen CRANBERRY PO Take 2 tablets by mouth every morning.     Marland Kitchen estradiol (ESTRACE VAGINAL) 0.1 MG/GM vaginal cream Apply 0.5mg  (pea-sized amount)  just inside the vaginal introitus with a finger-tip every night for two weeks and then Monday, Wednesday and Friday nights. 30 g 12  . ezetimibe (ZETIA) 10 MG tablet TAKE 1 TABLET BY MOUTH EVERY DAY 90 tablet 3  . losartan (COZAAR) 25 MG tablet TAKE 1 TABLET BY MOUTH EVERY DAY 90 tablet 2  . metoprolol tartrate (LOPRESSOR) 25 MG tablet TAKE 1 TABLET BY MOUTH TWICE A DAY 180 tablet 0  . pravastatin (PRAVACHOL) 80 MG tablet TAKE 1 TABLET BY MOUTH EVERYDAY AT  BEDTIME 90 tablet 0  . psyllium (METAMUCIL) 58.6 % powder Take 1 packet by mouth at bedtime.     Marland Kitchen tiotropium (SPIRIVA) 18 MCG inhalation capsule INHALE THE CONTENTS OF 1 CAPSULE ONE DAILY *NOT FOR ORAL USE*. 90 capsule 2  . sertraline (ZOLOFT) 100 MG tablet TAKE 1 TABLET EVERY MORNING AND 1/2 TABLET EVERY EVENING 135 tablet 1  . doxycycline (VIBRA-TABS) 100 MG tablet Take 1 tablet (100 mg total) by mouth 2 (two) times daily. 20 tablet 0  . omeprazole (PRILOSEC) 40 MG capsule TAKE 1 CAPSULE (40 MG TOTAL) BY MOUTH DAILY AS NEEDED (HEARTBURN). 30 capsule 1  . predniSONE (DELTASONE) 20 MG tablet Take two tablets daily for 4 days followed by one tablet daily for 4 days 12  tablet 0   No facility-administered medications prior to visit.      Per HPI unless specifically indicated in ROS section below Review of Systems Objective:    BP (!) 148/72   Pulse 62   Temp (!) 97.1 F (36.2 C)   Resp 20   Wt 119 lb 8 oz (54.2 kg)   SpO2 98% Comment: on 3 liters of O2  BMI 20.51 kg/m   Wt Readings from Last 3 Encounters:  03/05/19 119 lb 8 oz (54.2 kg)  11/10/18 120 lb (54.4 kg)  06/16/18 118 lb 8 oz (53.8 kg)    Physical Exam Vitals signs and nursing note reviewed.  Constitutional:      General: She is not in acute distress.    Appearance: Normal appearance. She is underweight. She is not ill-appearing.     Comments: Supplemental O2 via nasal cannula at 3L  HENT:     Mouth/Throat:     Mouth: Mucous membranes are moist.  Cardiovascular:     Rate and Rhythm: Normal rate and regular rhythm.     Pulses: Normal pulses.     Heart sounds: Normal heart sounds. No murmur.  Pulmonary:     Effort: Pulmonary effort is normal. No respiratory distress.     Breath sounds: Normal breath sounds. No wheezing, rhonchi or rales.  Musculoskeletal:     Right lower leg: No edema.     Left lower leg: No edema.     Comments: 2+ DP on right, diminished DP on left (unable to palpate)   Skin:    General: Skin is warm and dry.     Capillary Refill: Capillary refill takes less than 2 seconds.     Findings: No erythema or rash.  Neurological:     Mental Status: She is alert.  Psychiatric:        Mood and Affect: Mood normal.        Behavior: Behavior normal.       Results for orders placed or performed in visit on 11/13/18  Novel Coronavirus, NAA ([facility])  Result Value Ref Range   SARS-CoV-2, NAA Not Detected Not Detected   Lab Results  Component Value Date   HGBA1C 5.5 02/13/2014    Assessment & Plan:   Problem List Items Addressed This Visit    PAD (peripheral artery disease) ([place]) - Primary    Reviewed house calls result. L>>R PAD. She has diminished L  pedal pulses but is currently asymptomatic. Discussed treating blood pressure, cholesterol to limit plaque buildup. Will repeat ABI and consider vascular eval if remaining abnormal.       Relevant Orders   VAS [place] LE ART SEG MULTI ([facility])   Chronic diarrhea  Mild, longstanding. ?food related - advised to keep food diary to see what types of foods are contributing. Possible SSRI related - will decrease sertraline from 150mg  to 100mg  daily.  Monitor effect of above.        Other Visit Diagnoses    Need for influenza vaccination       Relevant Orders   Flu Vaccine QUAD High Dose(Fluad) (Completed)       Meds ordered this encounter  Medications  . sertraline (ZOLOFT) 100 MG tablet    Sig: Take 1 tablet (100 mg total) by mouth daily.    Dispense:  90 tablet    Refill:  1    Note new sig   Orders Placed This Encounter  Procedures  . Flu Vaccine QUAD High Dose(Fluad)    Patient instructions: Flu shot today Drop sertraline to 100mg  once daily. Let us know how this affects diarrhea.  Try to monitor what type of foods makes the diarrhea worse and let me know.  For feet - let's repeat artery evaluation of legs. We will call you to schedule this.   Follow up plan: Return if symptoms worsen or fail to improve.  Ria Bush, MD

## 2019-03-05 NOTE — Patient Instructions (Addendum)
Flu shot today Drop sertraline to 100mg  once daily. Let us know how this affects diarrhea.  Try to monitor what type of foods makes the diarrhea worse and let me know.  For feet - let's repeat artery evaluation of legs. We will call you to schedule this.   Chronic Diarrhea Diarrhea is a condition in which a person passes frequent loose and watery stools. It can cause you to feel weak and dehydrated. Dehydration can make you tired and thirsty. It can also cause a dry mouth, decreased urination, and dark yellow urine. Diarrhea is a sign of another underlying problem, such as:  Infection.  Medication side effects.  Dietary intolerance, such as lactose intolerance.  Conditions such as celiac disease, irritable bowel syndrome (IBS), or inflammatory bowel disease (IBD). In most cases, diarrhea lasts 2-3 days. Diarrhea that lasts longer than 4 weeks is called long-lasting (chronic) diarrhea. It is important that you treat your diarrhea as told by your health care provider. Follow these instructions at home: Follow these recommendations as told by your health care provider. Eating and drinking  Take an oral rehydration solution (ORS). This is a drink that is designed to keep you hydrated. It can be found at pharmacies and retail stores.  Drink clear fluids, such as water, ice chips, diluted fruit juice, and low-calorie sports drinks.  Follow the diet recommended by your health care provider. You may need to avoid foods that trigger diarrhea for you.  Avoid foods and beverages that contain a lot of sugar or caffeine.  Avoid alcohol.  Avoid spicy or fatty foods. General instructions      Drink enough fluid to keep your urine clear or pale yellow.  Wash your hands often and after each diarrhea episode. If soap and water are not available, use hand sanitizer.  Make sure that all people in your household wash their hands well and often.  Take over-the-counter and prescription medicines  only as told by your health care provider.  If you were prescribed an antibiotic medicine, take it as told by your health care provider. Do not stop taking the antibiotic even if you start to feel better.  Rest at home while you recover.  Watch your condition for any changes.  Take a warm bath to relieve any burning or pain from frequent diarrhea episodes.  Keep all follow-up visits as told by your health care provider. This is important. Contact a health care provider if:  You have a fever.  Your diarrhea gets worse or does not get better.  You have new symptoms.  You cannot drink fluids without vomiting.  You feel light-headed or dizzy.  You have a headache.  You have muscle cramps.  You have severe pain in the rectum. Get help right away if:  You have persistent vomiting.  You have chest pain.  You feel extremely weak or you faint.  You have bloody or black stools, or stools that look like tar.  You have severe pain, cramping, or bloating in your abdomen, or pain that stays in one place.  You have trouble breathing or you are breathing very quickly.  Your heart is beating very quickly.  Your skin feels cold and clammy.  You feel confused.  You have a severe headache.  You have signs of dehydration, such as: ? Dark urine, very little urine, or no urine. ? Cracked lips. ? Dry mouth. ? Sunken eyes. ? Sleepiness. ? Weakness. Summary  Chronic diarrhea is a condition in which a  person passes frequent loose and watery stools for more than 4 weeks.  Diarrhea is a sign of another underlying problem.  Drink enough fluid to keep your urine clear or pale yellow to avoid dehydration.  Wash your hands often and after each diarrhea episode. If soap and water are not available, use hand sanitizer.  It is important that you treat your diarrhea as told by your health care provider. This information is not intended to replace advice given to you by your health care  provider. Make sure you discuss any questions you have with your health care provider. Document Released: 07/31/2003 Document Revised: 08/05/2017 Document Reviewed: 03/29/2016 Elsevier Patient Education  2020 [employer].

## 2019-03-05 NOTE — Assessment & Plan Note (Signed)
Reviewed house calls result. L>>R PAD. She has diminished L pedal pulses but is currently asymptomatic. Discussed treating blood pressure, cholesterol to limit plaque buildup. Will repeat ABI and consider vascular eval if remaining abnormal.

## 2019-03-05 NOTE — Assessment & Plan Note (Signed)
Mild, longstanding. ?food related - advised to keep food diary to see what types of foods are contributing. Possible SSRI related - will decrease sertraline from 150mg  to 100mg  daily.  Monitor effect of above.

## 2019-03-16 DIAGNOSIS — R0902 Hypoxemia: Secondary | ICD-10-CM | POA: Diagnosis not present

## 2019-03-16 DIAGNOSIS — J309 Allergic rhinitis, unspecified: Secondary | ICD-10-CM | POA: Diagnosis not present

## 2019-03-21 ENCOUNTER — Ambulatory Visit: Payer: PPO

## 2019-03-25 ENCOUNTER — Other Ambulatory Visit: Payer: Self-pay | Admitting: Family Medicine

## 2019-03-25 DIAGNOSIS — R739 Hyperglycemia, unspecified: Secondary | ICD-10-CM

## 2019-03-25 DIAGNOSIS — E785 Hyperlipidemia, unspecified: Secondary | ICD-10-CM

## 2019-03-25 DIAGNOSIS — K529 Noninfective gastroenteritis and colitis, unspecified: Secondary | ICD-10-CM

## 2019-03-26 ENCOUNTER — Encounter: Payer: PPO | Admitting: Family Medicine

## 2019-03-27 ENCOUNTER — Ambulatory Visit (INDEPENDENT_AMBULATORY_CARE_PROVIDER_SITE_OTHER): Payer: PPO

## 2019-03-27 ENCOUNTER — Other Ambulatory Visit (INDEPENDENT_AMBULATORY_CARE_PROVIDER_SITE_OTHER): Payer: PPO

## 2019-03-27 DIAGNOSIS — K529 Noninfective gastroenteritis and colitis, unspecified: Secondary | ICD-10-CM | POA: Diagnosis not present

## 2019-03-27 DIAGNOSIS — E785 Hyperlipidemia, unspecified: Secondary | ICD-10-CM

## 2019-03-27 DIAGNOSIS — Z0000 Encounter for general adult medical examination without abnormal findings: Secondary | ICD-10-CM

## 2019-03-27 DIAGNOSIS — R739 Hyperglycemia, unspecified: Secondary | ICD-10-CM | POA: Diagnosis not present

## 2019-03-27 LAB — TSH: TSH: 0.96 u[IU]/mL (ref 0.35–4.50)

## 2019-03-27 LAB — LIPID PANEL
Cholesterol: 156 mg/dL (ref 0–200)
HDL: 71.7 mg/dL (ref >39.00)
LDL Cholesterol: 68 mg/dL (ref 0–99)
NonHDL: 84.43
Total CHOL/HDL Ratio: 2
Triglycerides: 83 mg/dL (ref 0.0–149.0)
VLDL: 16.6 mg/dL (ref 0.0–40.0)

## 2019-03-27 LAB — COMPREHENSIVE METABOLIC PANEL
ALT: 18 U/L (ref 0–35)
AST: 23 U/L (ref 0–37)
Albumin: 4.5 g/dL (ref 3.5–5.2)
Alkaline Phosphatase: 35 U/L — ABNORMAL LOW (ref 39–117)
BUN: 11 mg/dL (ref 6–23)
CO2: 34 mEq/L — ABNORMAL HIGH (ref 19–32)
Calcium: 9.4 mg/dL (ref 8.4–10.5)
Chloride: 100 mEq/L (ref 96–112)
Creatinine, Ser: 0.84 mg/dL (ref 0.40–1.20)
GFR: 65.57 mL/min (ref >60.00)
Glucose, Bld: 108 mg/dL — ABNORMAL HIGH (ref 70–99)
Potassium: 4.5 mEq/L (ref 3.5–5.1)
Sodium: 139 mEq/L (ref 135–145)
Total Bilirubin: 0.5 mg/dL (ref 0.2–1.2)
Total Protein: 6.9 g/dL (ref 6.0–8.3)

## 2019-03-27 LAB — CBC WITH DIFFERENTIAL/PLATELET
Basophils Absolute: 0 10*3/uL (ref 0.0–0.1)
Basophils Relative: 0.4 % (ref 0.0–3.0)
Eosinophils Absolute: 0.2 10*3/uL (ref 0.0–0.7)
Eosinophils Relative: 2.6 % (ref 0.0–5.0)
HCT: 40.9 % (ref 36.0–46.0)
Hemoglobin: 13.6 g/dL (ref 12.0–15.0)
Lymphocytes Relative: 29.5 % (ref 12.0–46.0)
Lymphs Abs: 2.5 10*3/uL (ref 0.7–4.0)
MCHC: 33.3 g/dL (ref 30.0–36.0)
MCV: 94.3 fl (ref 78.0–100.0)
Monocytes Absolute: 0.6 10*3/uL (ref 0.1–1.0)
Monocytes Relative: 7.4 % (ref 3.0–12.0)
Neutro Abs: 5.1 10*3/uL (ref 1.4–7.7)
Neutrophils Relative %: 60.1 % (ref 43.0–77.0)
Platelets: 164 10*3/uL (ref 150.0–400.0)
RBC: 4.33 Mil/uL (ref 3.87–5.11)
RDW: 13 % (ref 11.5–15.5)
WBC: 8.6 10*3/uL (ref 4.0–10.5)

## 2019-03-27 NOTE — Patient Instructions (Signed)
[name] [name] , Thank you for taking time to come for your Medicare Wellness Visit. I appreciate your ongoing commitment to your health goals. Please review the following plan we discussed and let me know if I can assist you in the future.   Screening recommendations/referrals: Colonoscopy: up to date, completed 04/30/2014 Mammogram: Patient will schedule her own appointment.  Bone Density: up to date, completed 10/04/2016 Recommended yearly ophthalmology/optometry visit for glaucoma screening and checkup Recommended yearly dental visit for hygiene and checkup  Vaccinations: Influenza vaccine: up to date, completed 03/05/2019 Pneumococcal vaccine: Completed series Tdap vaccine: decline Shingles vaccine: will get if insurance covers this    Advanced directives: Please bring a copy of your POA (Power of Attorney) and/or Living Will to your next appointment.   Conditions/risks identified: hypertension, hyperlipidemia  Next appointment: 04/03/2019 @ 12 pm    Preventive Care 65 Years and Older, Female Preventive care refers to lifestyle choices and visits with your health care provider that can promote health and wellness. What does preventive care include?  A yearly physical exam. This is also called an annual well check.  Dental exams once or twice a year.  Routine eye exams. Ask your health care provider how often you should have your eyes checked.  Personal lifestyle choices, including:  Daily care of your teeth and gums.  Regular physical activity.  Eating a healthy diet.  Avoiding tobacco and drug use.  Limiting alcohol use.  Practicing safe sex.  Taking low-dose aspirin every day.  Taking vitamin and mineral supplements as recommended by your health care provider. What happens during an annual well check? The services and screenings done by your health care provider during your annual well check will depend on your age, overall health, lifestyle risk factors, and  family history of disease. Counseling  Your health care provider may ask you questions about your:  Alcohol use.  Tobacco use.  Drug use.  Emotional well-being.  Home and relationship well-being.  Sexual activity.  Eating habits.  History of falls.  Memory and ability to understand (cognition).  Work and work Statistician.  Reproductive health. Screening  You may have the following tests or measurements:  Height, weight, and BMI.  Blood pressure.  Lipid and cholesterol levels. These may be checked every 5 years, or more frequently if you are over 81 years old.  Skin check.  Lung cancer screening. You may have this screening every year starting at age 96 if you have a 30-pack-year history of smoking and currently smoke or have quit within the past 15 years.  Fecal occult blood test (FOBT) of the stool. You may have this test every year starting at age 32.  Flexible sigmoidoscopy or colonoscopy. You may have a sigmoidoscopy every 5 years or a colonoscopy every 10 years starting at age 19.  Hepatitis C blood test.  Hepatitis B blood test.  Sexually transmitted disease (STD) testing.  Diabetes screening. This is done by checking your blood sugar (glucose) after you have not eaten for a while (fasting). You may have this done every 1-3 years.  Bone density scan. This is done to screen for osteoporosis. You may have this done starting at age 26.  Mammogram. This may be done every 1-2 years. Talk to your health care provider about how often you should have regular mammograms. Talk with your health care provider about your test results, treatment options, and if necessary, the need for more tests. Vaccines  Your health care provider may recommend certain  vaccines, such as:  Influenza vaccine. This is recommended every year.  Tetanus, diphtheria, and acellular pertussis (Tdap, Td) vaccine. You may need a Td booster every 10 years.  Zoster vaccine. You may need this  after age 56.  Pneumococcal 13-valent conjugate (PCV13) vaccine. One dose is recommended after age 22.  Pneumococcal polysaccharide (PPSV23) vaccine. One dose is recommended after age 74. Talk to your health care provider about which screenings and vaccines you need and how often you need them. This information is not intended to replace advice given to you by your health care provider. Make sure you discuss any questions you have with your health care provider. Document Released: 06/06/2015 Document Revised: 01/28/2016 Document Reviewed: 03/11/2015 Elsevier Interactive Patient Education  2017 [place] Prevention in the Home Falls can cause injuries. They can happen to people of all ages. There are many things you can do to make your home safe and to help prevent falls. What can I do on the outside of my home?  Regularly fix the edges of walkways and driveways and fix any cracks.  Remove anything that might make you trip as you walk through a door, such as a raised step or threshold.  Trim any bushes or trees on the path to your home.  Use bright outdoor lighting.  Clear any walking paths of anything that might make someone trip, such as rocks or tools.  Regularly check to see if handrails are loose or broken. Make sure that both sides of any steps have handrails.  Any raised decks and porches should have guardrails on the edges.  Have any leaves, snow, or ice cleared regularly.  Use sand or salt on walking paths during winter.  Clean up any spills in your garage right away. This includes oil or grease spills. What can I do in the bathroom?  Use night lights.  Install grab bars by the toilet and in the tub and shower. Do not use towel bars as grab bars.  Use non-skid mats or decals in the tub or shower.  If you need to sit down in the shower, use a plastic, non-slip stool.  Keep the floor dry. Clean up any water that spills on the floor as soon as it happens.   Remove soap buildup in the tub or shower regularly.  Attach bath mats securely with double-sided non-slip rug tape.  Do not have throw rugs and other things on the floor that can make you trip. What can I do in the bedroom?  Use night lights.  Make sure that you have a light by your bed that is easy to reach.  Do not use any sheets or blankets that are too big for your bed. They should not hang down onto the floor.  Have a firm chair that has side arms. You can use this for support while you get dressed.  Do not have throw rugs and other things on the floor that can make you trip. What can I do in the kitchen?  Clean up any spills right away.  Avoid walking on wet floors.  Keep items that you use a lot in easy-to-reach places.  If you need to reach something above you, use a strong step stool that has a grab bar.  Keep electrical cords out of the way.  Do not use floor polish or wax that makes floors slippery. If you must use wax, use non-skid floor wax.  Do not have throw rugs and other things  on the floor that can make you trip. What can I do with my stairs?  Do not leave any items on the stairs.  Make sure that there are handrails on both sides of the stairs and use them. Fix handrails that are broken or loose. Make sure that handrails are as long as the stairways.  Check any carpeting to make sure that it is firmly attached to the stairs. Fix any carpet that is loose or worn.  Avoid having throw rugs at the top or bottom of the stairs. If you do have throw rugs, attach them to the floor with carpet tape.  Make sure that you have a light switch at the top of the stairs and the bottom of the stairs. If you do not have them, ask someone to add them for you. What else can I do to help prevent falls?  Wear shoes that:  Do not have high heels.  Have rubber bottoms.  Are comfortable and fit you well.  Are closed at the toe. Do not wear sandals.  If you use a  stepladder:  Make sure that it is fully opened. Do not climb a closed stepladder.  Make sure that both sides of the stepladder are locked into place.  Ask someone to hold it for you, if possible.  Clearly mark and make sure that you can see:  Any grab bars or handrails.  First and last steps.  Where the edge of each step is.  Use tools that help you move around (mobility aids) if they are needed. These include:  Canes.  Walkers.  Scooters.  Crutches.  Turn on the lights when you go into a dark area. Replace any light bulbs as soon as they burn out.  Set up your furniture so you have a clear path. Avoid moving your furniture around.  If any of your floors are uneven, fix them.  If there are any pets around you, be aware of where they are.  Review your medicines with your doctor. Some medicines can make you feel dizzy. This can increase your chance of falling. Ask your doctor what other things that you can do to help prevent falls. This information is not intended to replace advice given to you by your health care provider. Make sure you discuss any questions you have with your health care provider. Document Released: 03/06/2009 Document Revised: 10/16/2015 Document Reviewed: 06/14/2014 Elsevier Interactive Patient Education  2017 [employer].

## 2019-03-27 NOTE — Progress Notes (Signed)
Subjective:   ANAYI BRICCO is a 78 y.o. female who presents for Medicare Annual (Subsequent) preventive examination.  Review of Systems: N/A   This visit is being conducted through telemedicine via telephone at the nurse health advisor's home address due to the COVID-19 pandemic. This patient has given me verbal consent via doximity to conduct this visit, patient states they are participating from their home address. Patient and myself are on the telephone call. There is no referral for this visit. Some vital signs may be absent or patient reported.    Patient identification: identified by name, DOB, and current address   Cardiac Risk Factors include: advanced age (>67men, >67 women);hypertension;dyslipidemia     Objective:     Vitals: There were no vitals taken for this visit.  There is no height or weight on file to calculate BMI.  Advanced Directives 03/27/2019 03/16/2018 08/12/2016 04/30/2014 04/24/2014  Does Patient Have a Medical Advance Directive? Yes Yes No No No  Type of Paramedic of [place];Living will [place];Living will - - -  Copy of [place] in Chart? No - copy requested No - copy requested - - -  Would patient like information on creating a medical advance directive? - - - - No - patient declined information    Tobacco Social History   Tobacco Use  Smoking Status Former Smoker  . Packs/day: 3.00  . Years: 34.00  . Pack years: 102.00  . Types: Cigarettes  . Quit date: 05/29/1995  . Years since quitting: 23.8  Smokeless Tobacco Never Used     Counseling given: Not Answered   Clinical Intake:  Pre-visit preparation completed: Yes  Pain : No/denies pain     Nutritional Risks: Nausea/ vomitting/ diarrhea(diarrhea x 1 year) Diabetes: No  How often do you need to have someone help you when you read instructions, pamphlets, or other written materials from your doctor or pharmacy?: 1 -  Never What is the last grade level you completed in school?: 3 years of college  Interpreter Needed?: No  Information entered by :: CJohnson, LPN  Past Medical History:  Diagnosis Date  . Chronic cystitis    recurrent UTI (4+ in last year), on suppressive doxy (uro Cope)  . Colon polyp   . 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 Depression with anxiety   . Diverticular disease 10/09/09   CT abd no sigmoid mass DDD L5/S1  . Dyslipidemia   . GERD (gastroesophageal reflux disease)   . Hematuria    h/o kidney stones  . Hiatal hernia   . Hypertension   . Left scapholunate ligament tear 2014   found on imaging  . Lung cancer ([place]) 1987   Partial RLL Lobectomy.  . Osteoarthritis   . Osteopenia 08/2011   DEXA: L femur -1.6  . Peptic ulcer   . Spinal abscess ([place]) 2000   secondary to periodontal disease   Past Surgical History:  Procedure Laterality Date  . BLADDER SURGERY    . BREAST BIOPSY Left 03/99   neg  . CHOLECYSTECTOMY  02/10/09  . COLONOSCOPY WITH PROPOFOL N/A 04/30/2014   Procedure: COLONOSCOPY WITH PROPOFOL;  Surgeon: Irene Shipper, MD;  Location: [facility];  Service: Endoscopy;  Laterality: N/A;  . CYSTOSCOPY  02/11/07 and 09/25/08   Dr. Jacqlyn Larsen, normal  . DEXA  08/2011   T femur -1.6, T spine -0.6  . [place] pneumonia  sepsis due to gum surgery  . OOPHORECTOMY    . REPLACEMENT TOTAL KNEE  09/2009   Right  . TOTAL ABDOMINAL HYSTERECTOMY  07/18/01   benign reason, BSO, vag repair secondary prolapse  . TUBAL LIGATION     bilateral   Family History  Problem Relation Age of Onset  . Pancreatitis Mother   . Heart disease Father   . Hypertension Father   . Emphysema Father   . Arthritis Brother   . Hypertension Brother   . Cancer Maternal Aunt        ovarian cancer  . Kidney disease Neg Hx   . Prostate cancer Neg Hx   . Bladder Cancer Neg Hx    Social History   Socioeconomic History  . Marital status: Married    Spouse name: Not on  file  . Number of children: 2  . Years of education: Not on file  . Highest education level: Not on file  Occupational History  . Occupation: Retired    Fish farm manager: RETIRED    Comment: prior Glass blower/designer at [employer]  . Financial resource strain: Not hard at all  . Food insecurity    Worry: Never true    Inability: Never true  . Transportation needs    Medical: No    Non-medical: No  Tobacco Use  . Smoking status: Former Smoker    Packs/day: 3.00    Years: 34.00    Pack years: 102.00    Types: Cigarettes    Quit date: 05/29/1995    Years since quitting: 23.8  . Smokeless tobacco: Never Used  Substance and Sexual Activity  . Alcohol use: Yes    Alcohol/week: 0.0 standard drinks    Comment: wine occasionally  . Drug use: No  . Sexual activity: Not Currently  Lifestyle  . Physical activity    Days per week: 0 days    Minutes per session: 0 min  . Stress: Not at all  Relationships  . Social Herbalist on phone: Not on file    Gets together: Not on file    Attends religious service: Not on file    Active member of club or organization: Not on file    Attends meetings of clubs or organizations: Not on file    Relationship status: Not on file  Other Topics Concern  . Not on file  Social History Narrative   Caffeine: 4 cups coffee   Lives in independent living at [facility] for the third time, 2nd husband died of heart attack. Two stepsons. 3rd husband died summer 2018   Activity: Walks for exercise   Diet: good water, fruits/vegetables daily      Advanced directed - would want HCPOA to be husband, Karlton Lemon. Does not have set up - requests handout.    Outpatient Encounter Medications as of 03/27/2019  Medication Sig  . albuterol (PROAIR HFA) 108 (90 Base) MCG/ACT inhaler Inhale 2 puffs into the lungs every 4 (four) hours as needed.  Marland Kitchen albuterol (PROVENTIL) (2.5 MG/3ML) 0.083% nebulizer solution Take every 4-6 hours as needed  for shortness of breath  . arformoterol (BROVANA) 15 MCG/2ML NEBU Take 2 mLs (15 mcg total) by nebulization 2 (two) times daily.  . Biotin 10 MG CAPS Take 1 capsule by mouth daily.  . budesonide (PULMICORT) 0.5 MG/2ML nebulizer solution USE THE CONTENTS OF ONE VIAL (2ML) BY NEBULIZATION TWICE A DAY.  Marland Kitchen buPROPion (WELLBUTRIN SR) 150 MG  12 hr tablet TAKE 1 TABLET BY MOUTH TWICE A DAY  . Calcium Carb-Cholecalciferol (CALCIUM 600+D3) 600-200 MG-UNIT TABS Take 1 tablet by mouth daily.  . cholecalciferol (VITAMIN D) 1000 units tablet Take 2,000 Units by mouth daily.   Marland Kitchen CRANBERRY PO Take 2 tablets by mouth every morning.   Marland Kitchen estradiol (ESTRACE VAGINAL) 0.1 MG/GM vaginal cream Apply 0.5mg  (pea-sized amount)  just inside the vaginal introitus with a finger-tip every night for two weeks and then Monday, Wednesday and Friday nights.  . ezetimibe (ZETIA) 10 MG tablet TAKE 1 TABLET BY MOUTH EVERY DAY  . losartan (COZAAR) 25 MG tablet TAKE 1 TABLET BY MOUTH EVERY DAY  . metoprolol tartrate (LOPRESSOR) 25 MG tablet TAKE 1 TABLET BY MOUTH TWICE A DAY  . pravastatin (PRAVACHOL) 80 MG tablet TAKE 1 TABLET BY MOUTH EVERYDAY AT BEDTIME  . psyllium (METAMUCIL) 58.6 % powder Take 1 packet by mouth at bedtime.   . sertraline (ZOLOFT) 100 MG tablet Take 1 tablet (100 mg total) by mouth daily.  Marland Kitchen tiotropium (SPIRIVA) 18 MCG inhalation capsule INHALE THE CONTENTS OF 1 CAPSULE ONE DAILY *NOT FOR ORAL USE*.   No facility-administered encounter medications on file as of 03/27/2019.     Activities of Daily Living In your present state of health, do you have any difficulty performing the following activities: 03/27/2019  Hearing? N  Vision? N  Difficulty concentrating or making decisions? N  Walking or climbing stairs? N  Dressing or bathing? N  Doing errands, shopping? N  Preparing Food and eating ? N  Using the Toilet? N  In the past six months, have you accidently leaked urine? N  Do you have problems with loss of  bowel control? N  Managing your Medications? N  Managing your Finances? N  Housekeeping or managing your Housekeeping? N  Some recent data might be hidden    Patient Care Team: Ria Bush, MD as PCP - General (Family Medicine) Minna Merritts, MD as Consulting Physician (Cardiology)    Assessment:   This is a routine wellness examination for Myliah.  Exercise Activities and Dietary recommendations Current Exercise Habits: The patient does not participate in regular exercise at present, Exercise limited by: None identified  Goals    . Increase physical activity     When weather permits, I will resume stretching and walking for 30 min 1-2 days per week.     . Patient Stated     03/27/2019, I will maintain and continue medications as prescribed.        Fall Risk Fall Risk  03/27/2019 03/16/2018 08/12/2016 12/16/2015 03/04/2015  Falls in the past year? 0 Yes No No No  Comment - 2-3 falls due to loss of balance - - -  Number falls in past yr: 0 2 or more - - -  Injury with Fall? 0 No - - -  Risk Factor Category  - High Fall Risk - - -  Risk for fall due to : Medication side effect History of fall(s);Impaired balance/gait - - -  Follow up Falls evaluation completed;Falls prevention discussed - - - -   Is the patient's home free of loose throw rugs in walkways, pet beds, electrical cords, etc?   yes      Grab bars in the bathroom? yes      Handrails on the stairs?   yes      Adequate lighting?   yes  Timed Get Up and Go performed: N/A  Depression Screen  PHQ 2/9 Scores 03/27/2019 03/16/2018 08/12/2016 03/10/2016  PHQ - 2 Score 0 0 0 0  PHQ- 9 Score 0 5 - 2     Cognitive Function MMSE - Mini Mental State Exam 03/27/2019 03/16/2018 08/12/2016  Orientation to time 5 5 5   Orientation to Place 5 5 5   Registration 3 3 3   Attention/ Calculation 5 0 0  Recall 3 3 2   Recall-comments - - pt was unable to recall 1 of 3 words  Language- name 2 objects - 0 0  Language- repeat 1  1 1   Language- follow 3 step command - 3 3  Language- read & follow direction - 0 0  Write a sentence - 0 0  Copy design - 0 0  Total score - 20 19  Mini Cog  Mini-Cog screen was completed. Maximum score is 22. A value of 0 denotes this part of the MMSE was not completed or the patient failed this part of the Mini-Cog screening.      Immunization History  Administered Date(s) Administered  . Fluad Quad(high Dose 65+) 03/05/2019  . Influenza Split 03/27/2012  . Influenza Whole 02/21/2006, 02/21/2008, 02/22/2011  . Influenza,inj,Quad PF,6+ Mos 02/16/2013, 02/18/2014, 03/04/2015, 01/21/2016, 06/15/2016, 02/01/2018  . Pneumococcal Conjugate-13 02/18/2014  . Pneumococcal Polysaccharide-23 02/10/2009  . Td 05/25/1998, 09/18/2008  . Zoster 11/13/2012    Qualifies for Shingles Vaccine? Yes  Screening Tests Health Maintenance  Topic Date Due  . MAMMOGRAM  01/19/2019  . DTaP/Tdap/Td (1 - Tdap) 03/26/2020 (Originally 03/24/1960)  . TETANUS/TDAP  03/26/2020 (Originally 09/19/2018)  . INFLUENZA VACCINE  Completed  . DEXA SCAN  Completed  . PNA vac Low Risk Adult  Completed    Cancer Screenings: Lung: Low Dose CT Chest recommended if Age 69-80 years, 30 pack-year currently smoking OR have quit w/in 15years. Patient does not qualify. Breast:  Up to date on Mammogram? No, Patient will schedule appointment.    Up to date of Bone Density/Dexa? Yes Colorectal: completed 04/30/2014  Additional Screenings:  Hepatitis C Screening: N/A     Plan:    Patient wants to maintain and continue medications as prescribed.    I have personally reviewed and noted the following in the patient's chart:   . Medical and social history . Use of alcohol, tobacco or illicit drugs  . Current medications and supplements . Functional ability and status . Nutritional status . Physical activity . Advanced directives . List of other physicians . Hospitalizations, surgeries, and ER visits in previous 12  months . Vitals . Screenings to include cognitive, depression, and falls . Referrals and appointments  In addition, I have reviewed and discussed with patient certain preventive protocols, quality metrics, and best practice recommendations. A written personalized care plan for preventive services as well as general preventive health recommendations were provided to patient.     Andrez Grime, LPN  [id number]

## 2019-03-27 NOTE — Progress Notes (Signed)
PCP notes:  Health Maintenance: Patient will schedule her own mammogram appointment. Patient wants to get the Shingrix vaccine if it is covered by her insurance.   Abnormal Screenings: none    Patient concerns: Patient is having brittle hair, skin and significant weight loss. Patient  wants to start back taking Omeprazole for acid reflux.     Nurse concerns: none   Next PCP appt.: 04/03/2019 @ 12 pm

## 2019-03-28 LAB — HEMOGLOBIN A1C: Hgb A1c MFr Bld: 5.3 % (ref 4.6–6.5)

## 2019-04-03 ENCOUNTER — Encounter: Payer: Self-pay | Admitting: Family Medicine

## 2019-04-03 ENCOUNTER — Other Ambulatory Visit: Payer: Self-pay

## 2019-04-03 ENCOUNTER — Ambulatory Visit (INDEPENDENT_AMBULATORY_CARE_PROVIDER_SITE_OTHER): Payer: PPO | Admitting: Family Medicine

## 2019-04-03 VITALS — BP 140/70 | HR 88 | Temp 97.9°F | Ht 63.5 in | Wt 118.6 lb

## 2019-04-03 DIAGNOSIS — F331 Major depressive disorder, recurrent, moderate: Secondary | ICD-10-CM

## 2019-04-03 DIAGNOSIS — I739 Peripheral vascular disease, unspecified: Secondary | ICD-10-CM | POA: Diagnosis not present

## 2019-04-03 DIAGNOSIS — Z0001 Encounter for general adult medical examination with abnormal findings: Secondary | ICD-10-CM | POA: Diagnosis not present

## 2019-04-03 DIAGNOSIS — N390 Urinary tract infection, site not specified: Secondary | ICD-10-CM | POA: Diagnosis not present

## 2019-04-03 DIAGNOSIS — Z0000 Encounter for general adult medical examination without abnormal findings: Secondary | ICD-10-CM

## 2019-04-03 DIAGNOSIS — R739 Hyperglycemia, unspecified: Secondary | ICD-10-CM

## 2019-04-03 DIAGNOSIS — J439 Emphysema, unspecified: Secondary | ICD-10-CM | POA: Diagnosis not present

## 2019-04-03 DIAGNOSIS — J9611 Chronic respiratory failure with hypoxia: Secondary | ICD-10-CM

## 2019-04-03 DIAGNOSIS — I10 Essential (primary) hypertension: Secondary | ICD-10-CM | POA: Diagnosis not present

## 2019-04-03 DIAGNOSIS — M8580 Other specified disorders of bone density and structure, unspecified site: Secondary | ICD-10-CM | POA: Diagnosis not present

## 2019-04-03 DIAGNOSIS — Z7189 Other specified counseling: Secondary | ICD-10-CM | POA: Diagnosis not present

## 2019-04-03 DIAGNOSIS — I2510 Atherosclerotic heart disease of native coronary artery without angina pectoris: Secondary | ICD-10-CM

## 2019-04-03 DIAGNOSIS — K529 Noninfective gastroenteritis and colitis, unspecified: Secondary | ICD-10-CM

## 2019-04-03 DIAGNOSIS — E785 Hyperlipidemia, unspecified: Secondary | ICD-10-CM

## 2019-04-03 DIAGNOSIS — J449 Chronic obstructive pulmonary disease, unspecified: Secondary | ICD-10-CM | POA: Diagnosis not present

## 2019-04-03 DIAGNOSIS — K219 Gastro-esophageal reflux disease without esophagitis: Secondary | ICD-10-CM

## 2019-04-03 MED ORDER — ZOSTER VAC RECOMB ADJUVANTED 50 MCG/0.5ML IM SUSR: 0.5000 mL | Freq: Once | INTRAMUSCULAR | 1 refills | Status: AC

## 2019-04-03 MED ORDER — OMEPRAZOLE 40 MG PO CPDR: 40.0000 mg | DELAYED_RELEASE_CAPSULE | Freq: Every day | ORAL | 3 refills | Status: DC | PRN

## 2019-04-03 MED ORDER — ALBUTEROL SULFATE HFA 108 (90 BASE) MCG/ACT IN AERS: 2.0000 | INHALATION_SPRAY | RESPIRATORY_TRACT | 3 refills | Status: DC | PRN

## 2019-04-03 NOTE — Assessment & Plan Note (Signed)
Significant improvement on lower SSRI dose - anticipate SSRI related diarrhea.

## 2019-04-03 NOTE — Assessment & Plan Note (Signed)
Continue zetia, statin. Trouble tolerating aspirin.

## 2019-04-03 NOTE — Assessment & Plan Note (Signed)
Continue cal/vit D.

## 2019-04-03 NOTE — Assessment & Plan Note (Addendum)
A1c in normal range.

## 2019-04-03 NOTE — Assessment & Plan Note (Signed)
Oxygen dependent COPD. Continue current regimen of spiriva, brovana, pulicort and PRN albuterol.

## 2019-04-03 NOTE — Assessment & Plan Note (Signed)
Advanced directed - HCPOA is [name], son. Asked to bring [place] a copy to update chart.

## 2019-04-03 NOTE — Assessment & Plan Note (Signed)
Chronic, stable on current regimen of statin and zetia.  The 10-year ASCVD risk score Mikey Bussing DC Brooke Bonito., et al., 2013) is: 55.3%   Values used to calculate the score:     Age: 78 years     Sex: Female     Is Non-Hispanic African American: No     Diabetic: Yes     Tobacco smoker: No     Systolic Blood Pressure: 443 mmHg     Is BP treated: Yes     HDL Cholesterol: 71.7 mg/dL     Total Cholesterol: 156 mg/dL

## 2019-04-03 NOTE — Assessment & Plan Note (Signed)
Pending ABI for abnormal home screening for PAD. Pt remains asxs.

## 2019-04-03 NOTE — Assessment & Plan Note (Signed)
Oxygen dependent COPD.

## 2019-04-03 NOTE — Progress Notes (Signed)
This visit was conducted in person.  BP 140/70 (BP Location: Left Arm, Patient Position: Sitting, Cuff Size: Normal)   Pulse 88   Temp 97.9 F (36.6 C) (Temporal)   Ht 5' 3.5" (1.613 m)   Wt 118 lb 9 oz (53.8 kg)   SpO2 99% Comment: 3 L  BMI 20.67 kg/m   CC: CPE Subjective:    [name], female    DOB: [birth year], 78 y.o.   MRN: [id number]  HPI: [name] [name] is a 78 y.o. female presenting on 04/03/2019 for Annual Exam   Saw health advisor last week for medicare wellness visit. Note reviewed. Wants shingrix vaccine. Ongoing weight loss, brittle hair and thinning skin. wants to restart omeprazole.   She just recently restarted biotin and vitamin D.  Intermittent GERD - wants to restart omeprazole.   No exam data present    Clinical Support from 03/27/2019 in [place] at [place]  PHQ-2 Total Score  0      Fall Risk  03/27/2019 03/16/2018 08/12/2016 12/16/2015 03/04/2015  Falls in the past year? 0 Yes No No No  Comment - 2-3 falls due to loss of balance - - -  Number falls in past yr: 0 2 or more - - -  Injury with Fall? 0 No - - -  Risk Factor Category  - High Fall Risk - - -  Risk for fall due to : Medication side effect History of fall(s);Impaired balance/gait - - -  Follow up Falls evaluation completed;Falls prevention discussed - - - -      Ongoing diarrhea - ?food related vs SSRI related - we decreased sertraline to 100mg  daily with drastic improvement!  Preventative: COLONOSCOPY WITH PROPOFOL Date: 04/30/2014 Irene Shipper - severe diverticulosis with sigmoid stenosis, no polyps, f/u PRN Well woman - has stopped.  Mammogram9/2019 (s/p diagnostic mammo L sided) - rpt 1 yr - will self schedule.  DEXAosteopenia -Date: 5/20218 DEXA T -0.6 spine, -1.7 hip stable, drinks 1 glass of milk daily. She continues calcium tablet daily and vitamin D daily.  Flu shotyearly  Td 2010  Pneumonia 2010 , prevnar 2015 Shingles - 10/2012   shingrix - interested - requests Rx to pharmacy.  Advanced directed - HCPOA is [name], son. Asked to bring [place] a copy to update chart.  Seat belt use discussed Sunscreen use discussed. No changing moles on skin. Ex smoker - quit 1997 Alcohol - 1 glass of wine occasionally Dentist yearly - due Eye exam yearly - scheduled later this month  Bowel - no constipation, diarrhea better Bladder - no recent UTIs - this has improved since taking vinegar tablets.   Caffeine: 4 cups coffee  Remarried for the third time, 2nd husband died of heart attack. Two stepsons  Activity: Walkssomefor exercise - 15 min/day Diet: good water, fruits/vegetables daily     Relevant past medical, surgical, family and social history reviewed and updated as indicated. Interim medical history since our last visit reviewed. Allergies and medications reviewed and updated. Outpatient Medications Prior to Visit  Medication Sig Dispense Refill  . albuterol (PROVENTIL) (2.5 MG/3ML) 0.083% nebulizer solution Take every 4-6 hours as needed for shortness of breath 75 mL 5  . arformoterol (BROVANA) 15 MCG/2ML NEBU Take 2 mLs (15 mcg total) by nebulization 2 (two) times daily. 120 mL 11  . Biotin 10 MG CAPS Take 1 capsule by mouth daily.    . budesonide (PULMICORT) 0.5 MG/2ML nebulizer solution USE  THE CONTENTS OF ONE VIAL (2ML) BY NEBULIZATION TWICE A DAY. 60 mL 5  . buPROPion (WELLBUTRIN SR) 150 MG 12 hr tablet TAKE 1 TABLET BY MOUTH TWICE A DAY 180 tablet 0  . Calcium Carb-Cholecalciferol (CALCIUM 600+D3) 600-200 MG-UNIT TABS Take 1 tablet by mouth daily.    . cholecalciferol (VITAMIN D) 1000 units tablet Take 2,000 Units by mouth daily.     Marland Kitchen CRANBERRY PO Take 2 tablets by mouth every morning.     Marland Kitchen estradiol (ESTRACE VAGINAL) 0.1 MG/GM vaginal cream Apply 0.5mg  (pea-sized amount)  just inside the vaginal introitus with a finger-tip every night for two weeks and then Monday, Wednesday and Friday nights. 30 g 12  .  ezetimibe (ZETIA) 10 MG tablet TAKE 1 TABLET BY MOUTH EVERY DAY 90 tablet 3  . losartan (COZAAR) 25 MG tablet TAKE 1 TABLET BY MOUTH EVERY DAY 90 tablet 2  . metoprolol tartrate (LOPRESSOR) 25 MG tablet TAKE 1 TABLET BY MOUTH TWICE A DAY 180 tablet 0  . pravastatin (PRAVACHOL) 80 MG tablet TAKE 1 TABLET BY MOUTH EVERYDAY AT BEDTIME 90 tablet 0  . psyllium (METAMUCIL) 58.6 % powder Take 1 packet by mouth at bedtime.     . sertraline (ZOLOFT) 100 MG tablet Take 1 tablet (100 mg total) by mouth daily. 90 tablet 1  . tiotropium (SPIRIVA) 18 MCG inhalation capsule INHALE THE CONTENTS OF 1 CAPSULE ONE DAILY *NOT FOR ORAL USE*. 90 capsule 2  . albuterol (PROAIR HFA) 108 (90 Base) MCG/ACT inhaler Inhale 2 puffs into the lungs every 4 (four) hours as needed. 1 Inhaler 11   No facility-administered medications prior to visit.      Per HPI unless specifically indicated in ROS section below Review of Systems  Constitutional: Negative for activity change, appetite change, chills, fatigue, fever and unexpected weight change.  HENT: Negative for hearing loss.   Eyes: Negative for visual disturbance.  Respiratory: Positive for shortness of breath and wheezing. Negative for cough and chest tightness.   Cardiovascular: Negative for chest pain, palpitations and leg swelling.  Gastrointestinal: Negative for abdominal distention, abdominal pain, blood in stool, constipation, diarrhea, nausea and vomiting.  Genitourinary: Negative for difficulty urinating and hematuria.  Musculoskeletal: Negative for arthralgias, myalgias and neck pain.  Skin: Negative for rash.  Neurological: Positive for headaches (frontal). Negative for dizziness, seizures and syncope.  Hematological: Negative for adenopathy. Bruises/bleeds easily (easy nosebleeds due to O2 use).  Psychiatric/Behavioral: Negative for dysphoric mood. The patient is not nervous/anxious.    Objective:    BP 140/70 (BP Location: Left Arm, Patient Position:  Sitting, Cuff Size: Normal)   Pulse 88   Temp 97.9 F (36.6 C) (Temporal)   Ht 5' 3.5" (1.613 m)   Wt 118 lb 9 oz (53.8 kg)   SpO2 99% Comment: 3 L  BMI 20.67 kg/m   Wt Readings from Last 3 Encounters:  04/03/19 118 lb 9 oz (53.8 kg)  03/05/19 119 lb 8 oz (54.2 kg)  11/10/18 120 lb (54.4 kg)    Physical Exam Vitals signs and nursing note reviewed.  Constitutional:      General: She is not in acute distress.    Appearance: Normal appearance. She is well-developed. She is not ill-appearing.     Comments:  Supplemental O2 via [place] in place Marked dyspnea with walking out to and back from car to replace her O2 battery  HENT:     Head: Normocephalic and atraumatic.     Right Ear: Hearing,  tympanic membrane, ear canal and external ear normal.     Left Ear: Hearing, tympanic membrane, ear canal and external ear normal.     Nose: Nose normal.     Mouth/Throat:     Mouth: Mucous membranes are moist.     Pharynx: Oropharynx is clear. Uvula midline. No posterior oropharyngeal erythema.  Eyes:     General: No scleral icterus.    Extraocular Movements: Extraocular movements intact.     Conjunctiva/sclera: Conjunctivae normal.     Pupils: Pupils are equal, round, and reactive to light.  Neck:     Musculoskeletal: Normal range of motion and neck supple.     Vascular: No carotid bruit.  Cardiovascular:     Rate and Rhythm: Normal rate and regular rhythm.     Pulses: Normal pulses.          Radial pulses are 2+ on the right side and 2+ on the left side.     Heart sounds: Normal heart sounds. No murmur.  Pulmonary:     Effort: Pulmonary effort is normal. No respiratory distress.     Breath sounds: Normal breath sounds. No wheezing, rhonchi or rales.  Abdominal:     General: Abdomen is flat. Bowel sounds are normal. There is no distension.     Palpations: Abdomen is soft. There is no mass.     Tenderness: There is no abdominal tenderness. There is no guarding or rebound.     Hernia: No  hernia is present.  Musculoskeletal: Normal range of motion.  Lymphadenopathy:     Cervical: No cervical adenopathy.  Skin:    General: Skin is warm and dry.     Findings: No rash.  Neurological:     Mental Status: She is alert and oriented to person, place, and time.     Comments: CN grossly intact, station and gait intact  Psychiatric:        Behavior: Behavior normal.        Thought Content: Thought content normal.        Judgment: Judgment normal.       Results for orders placed or performed in visit on 03/27/19  TSH  Result Value Ref Range   TSH 0.96 0.35 - 4.50 uIU/mL  CBC with Differential/Platelet  Result Value Ref Range   WBC 8.6 4.0 - 10.5 K/uL   RBC 4.33 3.87 - 5.11 Mil/uL   Hemoglobin 13.6 12.0 - 15.0 g/dL   HCT 40.9 36.0 - 46.0 %   MCV 94.3 78.0 - 100.0 fl   MCHC 33.3 30.0 - 36.0 g/dL   RDW 13.0 11.5 - 15.5 %   Platelets 164.0 150.0 - 400.0 K/uL   Neutrophils Relative % 60.1 43.0 - 77.0 %   Lymphocytes Relative 29.5 12.0 - 46.0 %   Monocytes Relative 7.4 3.0 - 12.0 %   Eosinophils Relative 2.6 0.0 - 5.0 %   Basophils Relative 0.4 0.0 - 3.0 %   Neutro Abs 5.1 1.4 - 7.7 K/uL   Lymphs Abs 2.5 0.7 - 4.0 K/uL   Monocytes Absolute 0.6 0.1 - 1.0 K/uL   Eosinophils Absolute 0.2 0.0 - 0.7 K/uL   Basophils Absolute 0.0 0.0 - 0.1 K/uL  Hemoglobin A1c  Result Value Ref Range   Hgb A1c MFr Bld 5.3 4.6 - 6.5 %  Comprehensive metabolic panel  Result Value Ref Range   Sodium 139 135 - 145 mEq/L   Potassium 4.5 3.5 - 5.1 mEq/L   Chloride 100 96 -  112 mEq/L   CO2 34 (H) 19 - 32 mEq/L   Glucose, Bld 108 (H) 70 - 99 mg/dL   BUN 11 6 - 23 mg/dL   Creatinine, Ser 0.84 0.40 - 1.20 mg/dL   Total Bilirubin 0.5 0.2 - 1.2 mg/dL   Alkaline Phosphatase 35 (L) 39 - 117 U/L   AST 23 0 - 37 U/L   ALT 18 0 - 35 U/L   Total Protein 6.9 6.0 - 8.3 g/dL   Albumin 4.5 3.5 - 5.2 g/dL   Calcium 9.4 8.4 - 10.5 mg/dL   GFR 65.57 >60.00 mL/min  Lipid panel  Result Value Ref Range    Cholesterol 156 0 - 200 mg/dL   Triglycerides 83.0 0.0 - 149.0 mg/dL   HDL 71.70 >39.00 mg/dL   VLDL 16.6 0.0 - 40.0 mg/dL   LDL Cholesterol 68 0 - 99 mg/dL   Total CHOL/HDL Ratio 2    NonHDL 84.43    Assessment & Plan:   Problem List Items Addressed This Visit    Recurrent UTI    Significant improvement with vinegar tablets - no recent UTI.       Relevant Medications   Zoster Vaccine Adjuvanted [facility]) injection   PAD (peripheral artery disease) ([place])    Pending ABI for abnormal home screening for PAD. Pt remains asxs.       Osteopenia    Continue cal/vit D.       MDD (major depressive disorder), recurrent episode, moderate (HCC)    Chronic, stable period on lower sertraline dose. Continue this and wellbutrin.       Hyperglycemia    A1c in normal range.       Health maintenance examination - Primary    Preventative protocols reviewed and updated unless pt declined. Discussed healthy diet and lifestyle.       GOLD 4 COPD with emphysema    Oxygen dependent COPD. Continue current regimen of spiriva, brovana, pulicort and PRN albuterol.       Relevant Medications   albuterol (PROAIR HFA) 108 (90 Base) MCG/ACT inhaler   GERD (gastroesophageal reflux disease)    GERD vs LPR. Restart omeprazole to use PRN.       Relevant Medications   omeprazole (PRILOSEC) 40 MG capsule   Essential hypertension    Chronic, stable on current regimen.       Dyslipidemia    Chronic, stable on current regimen of statin and zetia.  The 10-year ASCVD risk score Mikey Bussing DC Brooke Bonito., et al., 2013) is: 55.3%   Values used to calculate the score:     Age: 20 years     Sex: Female     Is Non-Hispanic African American: No     Diabetic: Yes     Tobacco smoker: No     Systolic Blood Pressure: 947 mmHg     Is BP treated: Yes     HDL Cholesterol: 71.7 mg/dL     Total Cholesterol: 156 mg/dL       Chronic respiratory failure with hypoxia (HCC)    Oxygen dependent COPD.       Chronic  diarrhea    Significant improvement on lower SSRI dose - anticipate SSRI related diarrhea.       CAD (coronary artery disease)    Continue zetia, statin. Trouble tolerating aspirin.       Advanced care planning/counseling discussion    Advanced directed - HCPOA is [name], son. Asked to bring [place] a copy to update chart.  Meds ordered this encounter  Medications  . albuterol (PROAIR HFA) 108 (90 Base) MCG/ACT inhaler    Sig: Inhale 2 puffs into the lungs every 4 (four) hours as needed.    Dispense:  18 g    Refill:  3  . omeprazole (PRILOSEC) 40 MG capsule    Sig: Take 1 capsule (40 mg total) by mouth daily as needed (heartburn).    Dispense:  30 capsule    Refill:  3  . Zoster Vaccine Adjuvanted [facility]) injection    Sig: Inject 0.5 mLs into the muscle once for 1 dose. Repeat in 2-6 months    Dispense:  0.5 mL    Refill:  1   No orders of the defined types were placed in this encounter.   Patient instructions: Bring [place] a copy of your advanced directive to update your chart. I have sent in shingrix vaccine prescription to your local pharmacy.  For wrist pain - likely from osteoarthritis (wear and tear arthritis). Use tylenol as needed for pain, warm compresses, try glucosamine supplement for joint health.  You are doing well today. Return as needed or in 6 months for follow up visit.   Follow up plan: Return in about 6 months (around 10/01/2019) for follow up visit.  Ria Bush, MD

## 2019-04-03 NOTE — Assessment & Plan Note (Signed)
Preventative protocols reviewed and updated unless pt declined. Discussed healthy diet and lifestyle.  

## 2019-04-03 NOTE — Assessment & Plan Note (Signed)
Chronic, stable period on lower sertraline dose. Continue this and wellbutrin.

## 2019-04-03 NOTE — Assessment & Plan Note (Addendum)
Significant improvement with vinegar tablets - no recent UTI.

## 2019-04-03 NOTE — Assessment & Plan Note (Signed)
Chronic, stable on current regimen.  

## 2019-04-03 NOTE — Patient Instructions (Addendum)
Bring [place] a copy of your advanced directive to update your chart. I have sent in shingrix vaccine prescription to your local pharmacy.  For wrist pain - likely from osteoarthritis (wear and tear arthritis). Use tylenol as needed for pain, warm compresses, try glucosamine supplement for joint health.  You are doing well today. Return as needed or in 6 months for follow up visit.   Health Maintenance After Age 78 After age 55, you are at a higher risk for certain long-term diseases and infections as well as injuries from falls. Falls are a major cause of broken bones and head injuries in people who are older than age 66. Getting regular preventive care can help to keep you healthy and well. Preventive care includes getting regular testing and making lifestyle changes as recommended by your health care provider. Talk with your health care provider about:  Which screenings and tests you should have. A screening is a test that checks for a disease when you have no symptoms.  A diet and exercise plan that is right for you. What should I know about screenings and tests to prevent falls? Screening and testing are the best ways to find a health problem early. Early diagnosis and treatment give you the best chance of managing medical conditions that are common after age 7. Certain conditions and lifestyle choices may make you more likely to have a fall. Your health care provider may recommend:  Regular vision checks. Poor vision and conditions such as cataracts can make you more likely to have a fall. If you wear glasses, make sure to get your prescription updated if your vision changes.  Medicine review. Work with your health care provider to regularly review all of the medicines you are taking, including over-the-counter medicines. Ask your health care provider about any side effects that may make you more likely to have a fall. Tell your health care provider if any medicines that you take make you feel  dizzy or sleepy.  Osteoporosis screening. Osteoporosis is a condition that causes the bones to get weaker. This can make the bones weak and cause them to break more easily.  Blood pressure screening. Blood pressure changes and medicines to control blood pressure can make you feel dizzy.  Strength and balance checks. Your health care provider may recommend certain tests to check your strength and balance while standing, walking, or changing positions.  Foot health exam. Foot pain and numbness, as well as not wearing proper footwear, can make you more likely to have a fall.  Depression screening. You may be more likely to have a fall if you have a fear of falling, feel emotionally low, or feel unable to do activities that you used to do.  Alcohol use screening. Using too much alcohol can affect your balance and may make you more likely to have a fall. What actions can I take to lower my risk of falls? General instructions  Talk with your health care provider about your risks for falling. Tell your health care provider if: ? You fall. Be sure to tell your health care provider about all falls, even ones that seem minor. ? You feel dizzy, sleepy, or off-balance.  Take over-the-counter and prescription medicines only as told by your health care provider. These include any supplements.  Eat a healthy diet and maintain a healthy weight. A healthy diet includes low-fat dairy products, low-fat (lean) meats, and fiber from whole grains, beans, and lots of fruits and vegetables. Home safety  Remove  any tripping hazards, such as rugs, cords, and clutter.  Install safety equipment such as grab bars in bathrooms and safety rails on stairs.  Keep rooms and walkways well-lit. Activity   Follow a regular exercise program to stay fit. This will help you maintain your balance. Ask your health care provider what types of exercise are appropriate for you.  If you need a cane or walker, use it as  recommended by your health care provider.  Wear supportive shoes that have nonskid soles. Lifestyle  Do not drink alcohol if your health care provider tells you not to drink.  If you drink alcohol, limit how much you have: ? 0-1 drink a day for women. ? 0-2 drinks a day for men.  Be aware of how much alcohol is in your drink. In the [place] one drink equals one typical bottle of beer (12 oz), one-half glass of wine (5 oz), or one shot of hard liquor (1 oz).  Do not use any products that contain nicotine or tobacco, such as cigarettes and e-cigarettes. If you need help quitting, ask your health care provider. Summary  Having a healthy lifestyle and getting preventive care can help to protect your health and wellness after age 75.  Screening and testing are the best way to find a health problem early and help you avoid having a fall. Early diagnosis and treatment give you the best chance for managing medical conditions that are more common for people who are older than age 6.  Falls are a major cause of broken bones and head injuries in people who are older than age 38. Take precautions to prevent a fall at home.  Work with your health care provider to learn what changes you can make to improve your health and wellness and to prevent falls. This information is not intended to replace advice given to you by your health care provider. Make sure you discuss any questions you have with your health care provider. Document Released: 03/23/2017 Document Revised: 08/31/2018 Document Reviewed: 03/23/2017 Elsevier Patient Education  2020 [employer].

## 2019-04-03 NOTE — Assessment & Plan Note (Addendum)
GERD vs LPR. Restart omeprazole to use PRN.

## 2019-04-07 ENCOUNTER — Other Ambulatory Visit: Payer: Self-pay | Admitting: Internal Medicine

## 2019-04-09 ENCOUNTER — Other Ambulatory Visit: Payer: Self-pay | Admitting: Family Medicine

## 2019-04-09 DIAGNOSIS — Z1231 Encounter for screening mammogram for malignant neoplasm of breast: Secondary | ICD-10-CM

## 2019-04-11 ENCOUNTER — Ambulatory Visit (INDEPENDENT_AMBULATORY_CARE_PROVIDER_SITE_OTHER): Payer: PPO

## 2019-04-11 ENCOUNTER — Other Ambulatory Visit: Payer: Self-pay

## 2019-04-11 DIAGNOSIS — I739 Peripheral vascular disease, unspecified: Secondary | ICD-10-CM | POA: Diagnosis not present

## 2019-04-13 ENCOUNTER — Ambulatory Visit
Admission: RE | Admit: 2019-04-13 | Discharge: 2019-04-13 | Disposition: A | Discharge status: Home / Self Care | Payer: PPO | Source: Ambulatory Visit | Attending: Family Medicine | Admitting: Family Medicine

## 2019-04-13 DIAGNOSIS — Z1231 Encounter for screening mammogram for malignant neoplasm of breast: Secondary | ICD-10-CM | POA: Insufficient documentation

## 2019-04-13 LAB — HM MAMMOGRAPHY

## 2019-04-16 ENCOUNTER — Encounter: Payer: Self-pay | Admitting: Family Medicine

## 2019-04-16 ENCOUNTER — Other Ambulatory Visit: Payer: Self-pay

## 2019-04-20 ENCOUNTER — Other Ambulatory Visit: Payer: Self-pay | Admitting: Family Medicine

## 2019-04-20 DIAGNOSIS — I739 Peripheral vascular disease, unspecified: Secondary | ICD-10-CM

## 2019-04-25 ENCOUNTER — Encounter: Payer: Self-pay | Admitting: Internal Medicine

## 2019-04-25 ENCOUNTER — Ambulatory Visit (INDEPENDENT_AMBULATORY_CARE_PROVIDER_SITE_OTHER): Payer: PPO | Admitting: Internal Medicine

## 2019-04-25 DIAGNOSIS — J449 Chronic obstructive pulmonary disease, unspecified: Secondary | ICD-10-CM

## 2019-04-25 DIAGNOSIS — J9611 Chronic respiratory failure with hypoxia: Secondary | ICD-10-CM

## 2019-04-25 NOTE — Patient Instructions (Signed)
MEDICATION ADJUSTMENTS/LABS AND TESTS ORDERED: Continue nebulizers as prescribed Continue oxygen as prescribed Avoid sick contacts

## 2019-04-25 NOTE — Progress Notes (Signed)
[facility]     I connected with the patient by telephone enabled telemedicine visit and verified that I am speaking with the correct person using two identifiers.    I discussed the limitations, risks, security and privacy concerns of performing an evaluation and management service by telemedicine and the availability of in-person appointments. I also discussed with the patient that there may be a patient responsible charge related to this service. The patient expressed understanding and agreed to proceed.  PATIENT AGREES AND CONFIRMS -YES   Other persons participating in the visit and their role in the encounter: Patient, nursing  This visit type was conducted due to national recommendations for restrictions regarding the COVID-19 Pandemic (e.g. social distancing).  This format is felt to be most appropriate for this patient at this time.  All issues noted in this document were discussed and addressed.       Date: 04/25/2019  MRN# [id number] [name] [name] 22-Apr-1941     SYNOPSIS The patient is a 78 year old female with a history of severe COPD maintained on nebulized Brovana, Pulmicort, Spiriva, Mucinex twice daily She uses oxygen at 3 L sometimes to 4 L with exertion. She is using brovana, pulmicort bid. She also uses albuterol MDI and nebulized about 2 to 3 times per day. She tries to remain active, walking with a walker. She lives in independent living, [place].  She continues to drive.   CC  follow up COPD  HPI No COPD exacerbation at this time No evidence of heart failure at this time No evidence or signs of infection at this time No respiratory distress No fevers, chills, nausea, vomiting, diarrhea No evidence of lower extremity edema No evidence hemoptysis  Uses oxygen to survive  UP TO DATE ON FLU SHOT OCT 2020  CXR 07/01/16>>findings of severe emphysema without significant interstitial lung disease.  Medication:    Current  Outpatient Medications:  .  albuterol (PROAIR HFA) 108 (90 Base) MCG/ACT inhaler, Inhale 2 puffs into the lungs every 4 (four) hours as needed., Disp: 18 g, Rfl: 3 .  albuterol (PROVENTIL) (2.5 MG/3ML) 0.083% nebulizer solution, Take every 4-6 hours as needed for shortness of breath, Disp: 75 mL, Rfl: 5 .  arformoterol (BROVANA) 15 MCG/2ML NEBU, Take 2 mLs (15 mcg total) by nebulization 2 (two) times daily., Disp: 120 mL, Rfl: 11 .  Biotin 10 MG CAPS, Take 1 capsule by mouth daily., Disp: , Rfl:  .  budesonide (PULMICORT) 0.5 MG/2ML nebulizer solution, USE THE CONTENTS OF ONE VIAL (2ML) BY NEBULIZATION TWICE A DAY., Disp: 60 mL, Rfl: 5 .  buPROPion (WELLBUTRIN SR) 150 MG 12 hr tablet, TAKE 1 TABLET BY MOUTH TWICE A DAY, Disp: 180 tablet, Rfl: 0 .  Calcium Carb-Cholecalciferol (CALCIUM 600+D3) 600-200 MG-UNIT TABS, Take 1 tablet by mouth daily., Disp: , Rfl:  .  cholecalciferol (VITAMIN D) 1000 units tablet, Take 2,000 Units by mouth daily. , Disp: , Rfl:  .  CRANBERRY PO, Take 2 tablets by mouth every morning. , Disp: , Rfl:  .  estradiol (ESTRACE VAGINAL) 0.1 MG/GM vaginal cream, Apply 0.5mg  (pea-sized amount)  just inside the vaginal introitus with a finger-tip every night for two weeks and then Monday, Wednesday and Friday nights., Disp: 30 g, Rfl: 12 .  ezetimibe (ZETIA) 10 MG tablet, TAKE 1 TABLET BY MOUTH EVERY DAY, Disp: 90 tablet, Rfl: 3 .  losartan (COZAAR) 25 MG tablet, TAKE 1 TABLET BY MOUTH EVERY DAY, Disp: 90 tablet,  Rfl: 2 .  metoprolol tartrate (LOPRESSOR) 25 MG tablet, TAKE 1 TABLET BY MOUTH TWICE A DAY, Disp: 180 tablet, Rfl: 0 .  omeprazole (PRILOSEC) 40 MG capsule, Take 1 capsule (40 mg total) by mouth daily as needed (heartburn)., Disp: 30 capsule, Rfl: 3 .  pravastatin (PRAVACHOL) 80 MG tablet, TAKE 1 TABLET BY MOUTH EVERYDAY AT BEDTIME, Disp: 90 tablet, Rfl: 0 .  psyllium (METAMUCIL) 58.6 % powder, Take 1 packet by mouth at bedtime. , Disp: , Rfl:  .  sertraline (ZOLOFT) 100 MG  tablet, Take 1 tablet (100 mg total) by mouth daily., Disp: 90 tablet, Rfl: 1 .  tiotropium (SPIRIVA) 18 MCG inhalation capsule, INHALE THE CONTENTS OF 1 CAPSULE ONE DAILY *NOT FOR ORAL USE*., Disp: 90 capsule, Rfl: 2   Allergies:  Keflex [cephalexin] and Penicillins   Review of Systems:  Gen:  Denies  fever, sweats, chills weight loss  HEENT: Denies blurred vision, double vision, ear pain, eye pain, hearing loss, nose bleeds, sore throat Cardiac:  No dizziness, chest pain or heaviness, chest tightness,edema, No JVD Resp:   No cough, -sputum production, +shortness of breath,-wheezing, -hemoptysis,  Gi: Denies swallowing difficulty, stomach pain, nausea or vomiting, diarrhea, constipation, bowel incontinence Gu:  Denies bladder incontinence, burning urine Ext:   Denies Joint pain, stiffness or swelling Skin: Denies  skin rash, easy bruising or bleeding or hives Endoc:  Denies polyuria, polydipsia , polyphagia or weight change Psych:   Denies depression, insomnia or hallucinations  Other:  All other systems negative      Assessment and Plan:  Severe emphysema Gold stage C Patient continues to take nebulized medications due to the fact that she has poor respiratory insufficiency and can no longer use traditional inhaler therapy Continue Brovana and Pulmicort nebs No signs of exacerbation at this time Flu shot up-to-date  Chronic shortness of breath and dyspnea exertion Multifactorial related to COPD and deconditioned state I have encouraged increased activity and exercise as tolerated  Hypoxic respiratory failure from COPD Patient benefits and uses 3 L oxygen She needs this to survive    COVID-19 EDUCATION: The signs and symptoms of COVID-19 were discussed with the patient and how to seek care for testing.  The importance of social distancing was discussed today. Hand Washing Techniques and avoid touching face was advised.     MEDICATION ADJUSTMENTS/LABS AND TESTS  ORDERED: Continue nebulizers as prescribed Continue oxygen as prescribed Avoid sick contacts   [name]   Patient satisfied with Plan of action and management. All questions answered  Follow up in 6 months  TOTAL TIME Spent 23 minutes  Corrin Parker, M.D.  [facility]  Medical Director [place] Director [facility] Cardio-Pulmonary Department

## 2019-05-02 DIAGNOSIS — J449 Chronic obstructive pulmonary disease, unspecified: Secondary | ICD-10-CM | POA: Diagnosis not present

## 2019-05-10 ENCOUNTER — Other Ambulatory Visit: Payer: Self-pay | Admitting: Family Medicine

## 2019-05-14 ENCOUNTER — Other Ambulatory Visit: Payer: Self-pay | Admitting: Family Medicine

## 2019-05-24 ENCOUNTER — Other Ambulatory Visit: Payer: PPO

## 2019-06-01 ENCOUNTER — Other Ambulatory Visit: Payer: PPO

## 2019-06-01 ENCOUNTER — Encounter: Payer: Self-pay | Admitting: Family Medicine

## 2019-06-01 ENCOUNTER — Ambulatory Visit (INDEPENDENT_AMBULATORY_CARE_PROVIDER_SITE_OTHER): Payer: PPO | Admitting: Family Medicine

## 2019-06-01 VITALS — Ht 63.5 in | Wt 125.0 lb

## 2019-06-01 DIAGNOSIS — N3000 Acute cystitis without hematuria: Secondary | ICD-10-CM | POA: Diagnosis not present

## 2019-06-01 DIAGNOSIS — M545 Low back pain, unspecified: Secondary | ICD-10-CM

## 2019-06-01 DIAGNOSIS — N390 Urinary tract infection, site not specified: Secondary | ICD-10-CM | POA: Diagnosis not present

## 2019-06-01 LAB — POC URINALSYSI DIPSTICK (AUTOMATED)
Bilirubin, UA: NEGATIVE
Glucose, UA: NEGATIVE
Ketones, UA: NEGATIVE
Nitrite, UA: POSITIVE
Protein, UA: POSITIVE — AB
Spec Grav, UA: 1.01 (ref 1.010–1.025)
Urobilinogen, UA: 0.2 E.U./dL
pH, UA: 8.5 — AB (ref 5.0–8.0)

## 2019-06-01 MED ORDER — SULFAMETHOXAZOLE-TRIMETHOPRIM 800-160 MG PO TABS: 1.0000 | ORAL_TABLET | Freq: Two times a day (BID) | ORAL | 0 refills | Status: DC

## 2019-06-01 NOTE — Assessment & Plan Note (Addendum)
Presume recurrent UTI after she stopped her vinegar tablets she uses preventatively. UA/micro suspicous for infection - UCx sent. WBC casts on microscopy - will treat with bactrim DS 10d course (in allergy to keflex/penicillins). Push fluids, update if not improving with treatment. Pt agrees with plan.

## 2019-06-01 NOTE — Progress Notes (Signed)
[name] [name] - 79 y.o. female  MRN [id number]  Date of Birth: 04/10/1941  PCP: Ria Bush, MD  This service was provided via telemedicine. Phone Visit performed on 06/01/2019    Rationale for phone visit along with limitations reviewed. Patient consented to telephone encounter.    Location of patient: at home  Location of provider: in car outside of office, [place] [facility]  Name of referring provider: N/A    Names of persons and role in encounter: Provider: Ria Bush, MD  Patient: [name] [name]  Other: N/A   Time on call: 2:36pm - 2:44pm   Subjective: Chief Complaint  Patient presents with  . Back Pain    C/o mid low back pain and burning at end of urination.  Sxs started about 1.5 wks ago.      HPI:  1.5 wk h/o significant lower back pain associated with dysuria at end of stream, some urgency and leaking. Some left flank discomfort. No frequency, hematuria, fevers/chills, nausea/vomiting, or abdominal pain. No recent abx use.  Recent trip to the beach. She delayed medical evaluation due to this.   H/o recurrent UTI, improved with vinegar tablets. She had run out but has restarted since new symptoms developed.   Ongoing rhinorrhea.    Objective/Observations:  No physical exam or vital signs collected unless specifically identified below.   Ht 5' 3.5" (1.613 m)   Wt 125 lb (56.7 kg)   BMI 21.80 kg/m   Wt Readings from Last 3 Encounters:  06/01/19 125 lb (56.7 kg)  04/03/19 118 lb 9 oz (53.8 kg)  03/05/19 119 lb 8 oz (54.2 kg)    Respiratory status: speaks in complete sentences without evident shortness of breath.   Results for orders placed or performed in visit on 06/01/19  POCT Urinalysis Dipstick (Automated)  Result Value Ref Range   Color, UA yellow    Clarity, UA cloudy    Glucose, UA Negative Negative   Bilirubin, UA negative    Ketones, UA negative    Spec Grav, UA 1.010 1.010 - 1.025   Blood, UA +/-    pH, UA 8.5  (A) 5.0 - 8.0   Protein, UA Positive (A) Negative   Urobilinogen, UA 0.2 0.2 or 1.0 E.U./dL   Nitrite, UA positive    Leukocytes, UA Small (1+) (A) Negative  UCx sent  Assessment/Plan:  Recurrent UTI Presume recurrent UTI after she stopped her vinegar tablets she uses preventatively. UA/micro suspicous for infection - UCx sent. WBC casts on microscopy - will treat with bactrim DS 10d course (in allergy to keflex/penicillins). Push fluids, update if not improving with treatment. Pt agrees with plan.    I discussed the assessment and treatment plan with the patient. The patient was provided an opportunity to ask questions and all were answered. The patient agreed with the plan and demonstrated an understanding of the instructions.   Lab Orders     Urine culture     POCT Urinalysis Dipstick (Automated)  Meds ordered this encounter  Medications  . DISCONTD: sulfamethoxazole-trimethoprim (BACTRIM DS) 800-160 MG tablet    Sig: Take 1 tablet by mouth 2 (two) times daily.    Dispense:  10 tablet    Refill:  0  . sulfamethoxazole-trimethoprim (BACTRIM DS) 800-160 MG tablet    Sig: Take 1 tablet by mouth 2 (two) times daily.    Dispense:  20 tablet    Refill:  0    Use this # for total  10 days    The patient was advised to call back or seek an in-person evaluation if the symptoms worsen or if the condition fails to improve as anticipated.  Ria Bush, MD

## 2019-06-03 LAB — URINE CULTURE
MICRO NUMBER:: 10022704
SPECIMEN QUALITY:: ADEQUATE

## 2019-06-04 ENCOUNTER — Telehealth: Payer: Self-pay | Admitting: *Deleted

## 2019-06-04 NOTE — Telephone Encounter (Addendum)
Patient left a voicemail stating that she needs to let [name] [name] know that the pharmacy did get the other antibiotic sent in for her. Patient stated that her son is going to pick the medication up for her and she will start taking it.

## 2019-06-04 NOTE — Telephone Encounter (Signed)
Noted  

## 2019-06-13 NOTE — Progress Notes (Signed)
06/14/2018  1:37 PM   [name] October 30, 1940 [id number]  Referring provider: Ria Bush, MD [address] [place],  [place] [address]  Chief Complaint  Patient presents with  . Urinary Incontinence    HPI: [name] [name] is a 79 y.o. female who presents today for annual follow-up for vaginal atrophy, a history of recurrent urinary tract infections and urge incontinence.     Vaginal atrophy Patient is using her vaginal estrogen occasionally.    History of recurrent urinary tract infections Patient was referred to [place] by, Dr. Danise Mina, for recurrent urinary tract infections.  She does have a history of GU surgery (bladder sling with Dr. Jacqlyn Larsen 2005).   Renal ultrasound performed on 01/23/2016 noted no hydronephrosis or other acute abnormalities of the kidneys.   Proteus UTI in 06/01/2019.    Urge incontinence The patient is experiencing urgency x 0-3 (stable), frequency x 4-7 (stable), is restricting fluids to avoid visits to the restroom x 0-3, is engaging in toilet mapping, incontinence x 0-3 (improved) and nocturia x 0-3 (stable).   Her BP is 141/74  Her PVR is 46 mL.  Patient denies any modifying or aggravating factors.  Patient denies any gross hematuria, dysuria or suprapubic/flank pain.  Patient denies any fevers, chills, nausea or vomiting.    PMH: Past Medical History:  Diagnosis Date  . Chronic cystitis    recurrent UTI (4+ in last year), on suppressive doxy (uro Cope)  . Colon polyp   . 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 Depression with anxiety   . Diverticular disease 10/09/09   CT abd no sigmoid mass DDD L5/S1  . Dyslipidemia   . GERD (gastroesophageal reflux disease)   . Hematuria    h/o kidney stones  . Hiatal hernia   . Hypertension   . Left scapholunate ligament tear 2014   found on imaging  . Lung cancer ([place]) 1987   Partial RLL Lobectomy.  . Osteoarthritis   . Osteopenia 08/2011   DEXA: L femur -1.6  . Peptic ulcer   . Spinal  abscess ([place]) 2000   secondary to periodontal disease    Surgical History: Past Surgical History:  Procedure Laterality Date  . BLADDER SURGERY    . BREAST BIOPSY Left 03/99   neg  . CHOLECYSTECTOMY  02/10/09  . COLONOSCOPY WITH PROPOFOL N/A 04/30/2014   Procedure: COLONOSCOPY WITH PROPOFOL;  Surgeon: Irene Shipper, MD;  Location: [facility];  Service: Endoscopy;  Laterality: N/A;  . CYSTOSCOPY  02/11/07 and 09/25/08   Dr. Jacqlyn Larsen, normal  . DEXA  08/2011   T femur -1.6, T spine -0.6  . [place] pneumonia sepsis due to gum surgery  . OOPHORECTOMY    . REPLACEMENT TOTAL KNEE  09/2009   Right  . TOTAL ABDOMINAL HYSTERECTOMY  07/18/01   benign reason, BSO, vag repair secondary prolapse  . TUBAL LIGATION     bilateral    Home Medications:  Allergies as of 06/14/2019      Reactions   Keflex [cephalexin] Shortness Of Breath   Penicillins    REACTION: hives      Medication List       Accurate as of June 14, 2019 11:59 PM. If you have any questions, ask your nurse or doctor.        albuterol (2.5 MG/3ML) 0.083% nebulizer solution Commonly known as: PROVENTIL Take every 4-6 hours as needed for shortness of breath  albuterol 108 (90 Base) MCG/ACT inhaler Commonly known as: ProAir HFA Inhale 2 puffs into the lungs every 4 (four) hours as needed.   arformoterol 15 MCG/2ML Nebu Commonly known as: BROVANA Take 2 mLs (15 mcg total) by nebulization 2 (two) times daily.   Biotin 10 MG Caps Take 1 capsule by mouth daily.   budesonide 0.5 MG/2ML nebulizer solution Commonly known as: PULMICORT USE THE CONTENTS OF ONE VIAL (2ML) BY NEBULIZATION TWICE A DAY.   buPROPion 150 MG 12 hr tablet Commonly known as: WELLBUTRIN SR TAKE 1 TABLET BY MOUTH TWICE A DAY   Calcium 600+D3 600-200 MG-UNIT Tabs Generic drug: Calcium Carb-Cholecalciferol Take 1 tablet by mouth daily.   cholecalciferol 1000 units tablet Commonly known as: VITAMIN D Take 2,000  Units by mouth daily.   CRANBERRY PO Take 2 tablets by mouth every morning.   estradiol 0.1 MG/GM vaginal cream Commonly known as: ESTRACE VAGINAL Apply 0.5mg  (pea-sized amount)  just inside the vaginal introitus with a finger-tip every night for two weeks and then Monday, Wednesday and Friday nights.   ezetimibe 10 MG tablet Commonly known as: ZETIA TAKE 1 TABLET BY MOUTH EVERY DAY   losartan 25 MG tablet Commonly known as: COZAAR TAKE 1 TABLET BY MOUTH EVERY DAY   metoprolol tartrate 25 MG tablet Commonly known as: LOPRESSOR TAKE 1 TABLET BY MOUTH TWICE A DAY   omeprazole 40 MG capsule Commonly known as: PRILOSEC Take 1 capsule (40 mg total) by mouth daily as needed (heartburn).   pravastatin 80 MG tablet Commonly known as: PRAVACHOL TAKE 1 TABLET BY MOUTH EVERYDAY AT BEDTIME   Premarin vaginal cream Generic drug: conjugated estrogens Place 1 Applicatorful vaginally daily. Apply 0.5mg  (pea-sized amount)  just inside the vaginal introitus with a finger-tip on  Monday, Wednesday and Friday nights. Started by: Zara Council, PA-C   psyllium 58.6 % powder Commonly known as: METAMUCIL Take 1 packet by mouth at bedtime.   sertraline 100 MG tablet Commonly known as: ZOLOFT TAKE 1 TABLET EVERY MORNING AND 1/2 TABLET EVERY EVENING   sulfamethoxazole-trimethoprim 800-160 MG tablet Commonly known as: BACTRIM DS Take 1 tablet by mouth 2 (two) times daily.   tiotropium 18 MCG inhalation capsule Commonly known as: SPIRIVA INHALE THE CONTENTS OF 1 CAPSULE ONE DAILY *NOT FOR ORAL USE*.       Allergies:  Allergies  Allergen Reactions  . Keflex [Cephalexin] Shortness Of Breath  . Penicillins     REACTION: hives    Family History: Family History  Problem Relation Age of Onset  . Pancreatitis Mother   . Heart disease Father   . Hypertension Father   . Emphysema Father   . Arthritis Brother   . Hypertension Brother   . Cancer Maternal Aunt        ovarian cancer    . Kidney disease Neg Hx   . Prostate cancer Neg Hx   . Bladder Cancer Neg Hx     Social History:  reports that she quit smoking about 24 years ago. Her smoking use included cigarettes. She has a 102.00 pack-year smoking history. She has never used smokeless tobacco. She reports current alcohol use. She reports that she does not use drugs.  ROS: UROLOGY Frequent Urination?: No Hard to postpone urination?: No Burning/pain with urination?: No Get up at night to urinate?: Yes Leakage of urine?: No Urine stream starts and stops?: No Trouble starting stream?: No Do you have to strain to urinate?: No Blood in urine?: No Urinary  tract infection?: Yes Sexually transmitted disease?: No Injury to kidneys or bladder?: No Painful intercourse?: No Weak stream?: No Currently pregnant?: No Vaginal bleeding?: No Last menstrual period?: n  Gastrointestinal Nausea?: No Vomiting?: No Indigestion/heartburn?: No Diarrhea?: No Constipation?: No  Constitutional Fever: No Night sweats?: No Weight loss?: Yes Fatigue?: Yes  Skin Skin rash/lesions?: No Itching?: No  Eyes Blurred vision?: No Double vision?: No  Ears/Nose/Throat Sore throat?: No Sinus problems?: Yes  Hematologic/Lymphatic Swollen glands?: No Easy bruising?: No  Cardiovascular Leg swelling?: No Chest pain?: No  Respiratory Cough?: No Shortness of breath?: No  Endocrine Excessive thirst?: No  Musculoskeletal Back pain?: No Joint pain?: No  Neurological Headaches?: No Dizziness?: No  Psychologic Depression?: No Anxiety?: No  Physical Exam: BP (!) 141/74   Pulse 61   Ht 5' 3.5" (1.613 m)   Wt 115 lb (52.2 kg)   BMI 20.05 kg/m   Constitutional:  Well nourished. Alert and oriented, No acute distress. HEENT: [place] AT, mask in place.  Trachea midline, no masses. Cardiovascular: No clubbing, cyanosis, or edema. Respiratory: Normal respiratory effort, no increased work of breathing. Neurologic:  Grossly intact, no focal deficits, moving all 4 extremities. Psychiatric: Normal mood and affect.   Laboratory Data: Lab Results  Component Value Date   WBC 8.6 03/27/2019   HGB 13.6 03/27/2019   HCT 40.9 03/27/2019   MCV 94.3 03/27/2019   PLT 164.0 03/27/2019    Lab Results  Component Value Date   CREATININE 0.84 03/27/2019     Lab Results  Component Value Date   TSH 0.96 03/27/2019       Component Value Date/Time   CHOL 156 03/27/2019 1140   HDL 71.70 03/27/2019 1140   CHOLHDL 2 03/27/2019 1140   VLDL 16.6 03/27/2019 1140   LDLCALC 68 03/27/2019 1140    Lab Results  Component Value Date   AST 23 03/27/2019   Lab Results  Component Value Date   ALT 18 03/27/2019     I have reviewed the labs.  Pertinent Imaging Results for SHONDREA, STEINERT (MRN [id number]) as of 06/20/2019 13:29  Ref. Range 06/14/2019 13:47  Scan Result Unknown 46    Assessment & Plan:    1. Microscopic hematuria - associated with UTI - recheck UA in three weeks  2. History of recurrent UTI -  Proteus UTI in 05/2019  - asymptomatic at this visit   3. Vaginal atrophy - encouraged to apply three nights weekly  - sample and prescription given   Return in about 3 weeks (around 07/05/2019) for UA only .  These notes generated with voice recognition software. I apologize for typographical errors.  Zara Council, PA-C  [facility] [address] [place], [place] [address] [phone]

## 2019-06-14 ENCOUNTER — Ambulatory Visit: Payer: PPO | Admitting: Urology

## 2019-06-14 ENCOUNTER — Other Ambulatory Visit: Payer: Self-pay

## 2019-06-14 ENCOUNTER — Encounter: Payer: Self-pay | Admitting: Urology

## 2019-06-14 VITALS — BP 141/74 | HR 61 | Ht 63.5 in | Wt 115.0 lb

## 2019-06-14 DIAGNOSIS — N3941 Urge incontinence: Secondary | ICD-10-CM

## 2019-06-14 DIAGNOSIS — N952 Postmenopausal atrophic vaginitis: Secondary | ICD-10-CM

## 2019-06-14 DIAGNOSIS — Z87440 Personal history of urinary (tract) infections: Secondary | ICD-10-CM | POA: Diagnosis not present

## 2019-06-14 LAB — BLADDER SCAN AMB NON-IMAGING: Scan Result: 46

## 2019-06-14 MED ORDER — PREMARIN 0.625 MG/GM VA CREA: 1.0000 | TOPICAL_CREAM | Freq: Every day | VAGINAL | 12 refills | Status: DC

## 2019-06-14 NOTE — Patient Instructions (Signed)
  You are given a sample of vaginal estrogen cream Premarin and instructed to apply 0.5mg  (pea-sized amount)  just inside the vaginal introitus with a finger-tip on Monday, Wednesday and Friday nights,

## 2019-06-20 ENCOUNTER — Other Ambulatory Visit: Payer: Self-pay | Admitting: Family Medicine

## 2019-06-20 DIAGNOSIS — I739 Peripheral vascular disease, unspecified: Secondary | ICD-10-CM

## 2019-06-21 ENCOUNTER — Ambulatory Visit (INDEPENDENT_AMBULATORY_CARE_PROVIDER_SITE_OTHER): Payer: PPO

## 2019-06-21 ENCOUNTER — Other Ambulatory Visit: Payer: Self-pay

## 2019-06-21 DIAGNOSIS — I739 Peripheral vascular disease, unspecified: Secondary | ICD-10-CM

## 2019-06-28 ENCOUNTER — Other Ambulatory Visit: Payer: Self-pay | Admitting: Family Medicine

## 2019-07-04 ENCOUNTER — Other Ambulatory Visit: Payer: Self-pay

## 2019-07-04 DIAGNOSIS — Z87440 Personal history of urinary (tract) infections: Secondary | ICD-10-CM

## 2019-07-05 ENCOUNTER — Other Ambulatory Visit: Payer: PPO

## 2019-07-10 ENCOUNTER — Other Ambulatory Visit: Payer: Self-pay

## 2019-07-10 ENCOUNTER — Other Ambulatory Visit: Payer: PPO

## 2019-07-10 DIAGNOSIS — Z87440 Personal history of urinary (tract) infections: Secondary | ICD-10-CM

## 2019-07-11 LAB — URINALYSIS, COMPLETE
Bilirubin, UA: NEGATIVE
Glucose, UA: NEGATIVE
Ketones, UA: NEGATIVE
Nitrite, UA: NEGATIVE
Protein,UA: NEGATIVE
Specific Gravity, UA: 1.015 (ref 1.005–1.030)
Urobilinogen, Ur: 0.2 mg/dL (ref 0.2–1.0)
pH, UA: 6.5 (ref 5.0–7.5)

## 2019-07-11 LAB — MICROSCOPIC EXAMINATION

## 2019-07-14 LAB — CULTURE, URINE COMPREHENSIVE

## 2019-07-16 ENCOUNTER — Telehealth: Payer: Self-pay | Admitting: Family Medicine

## 2019-07-16 ENCOUNTER — Other Ambulatory Visit: Payer: Self-pay | Admitting: Urology

## 2019-07-16 DIAGNOSIS — R3129 Other microscopic hematuria: Secondary | ICD-10-CM

## 2019-07-16 NOTE — Telephone Encounter (Signed)
-----   Message from Nori Riis, PA-C sent at 07/16/2019 10:46 AM EST ----- I have spoken with [name] [name] and notified her of the microscopic hematuria and the recommended work up.  I have placed orders for her CT Urogram and she will need an appointment for a CT urogram report and cystoscopy with one of the physician.  I explained the CT will require contrast and how the cystoscopy is performed.  I have advised her that after the cystoscopy she may have discomfort urinating and she also may see blood in her urine, but she is to increase her water intake and the symptoms should abate by the end of the day.

## 2019-07-16 NOTE — Telephone Encounter (Signed)
Spoke to patient and appointment has been scheduled.

## 2019-07-23 HISTORY — PX: CYSTOSCOPY: SUR368

## 2019-07-30 ENCOUNTER — Other Ambulatory Visit: Payer: Self-pay

## 2019-07-30 ENCOUNTER — Ambulatory Visit
Admission: RE | Admit: 2019-07-30 | Discharge: 2019-07-30 | Disposition: A | Discharge status: Home / Self Care | Payer: PPO | Source: Ambulatory Visit | Attending: Urology | Admitting: Urology

## 2019-07-30 DIAGNOSIS — R3129 Other microscopic hematuria: Secondary | ICD-10-CM | POA: Insufficient documentation

## 2019-07-30 DIAGNOSIS — K5730 Diverticulosis of large intestine without perforation or abscess without bleeding: Secondary | ICD-10-CM | POA: Diagnosis not present

## 2019-07-30 LAB — POCT I-STAT CREATININE: Creatinine, Ser: 0.9 mg/dL (ref 0.44–1.00)

## 2019-07-30 MED ORDER — IOHEXOL 300 MG/ML  SOLN
100.0000 mL | Freq: Once | INTRAMUSCULAR | Status: AC | PRN
  Administered 2019-07-30: 100 mL via INTRAVENOUS

## 2019-08-07 ENCOUNTER — Other Ambulatory Visit: Payer: Self-pay

## 2019-08-07 ENCOUNTER — Ambulatory Visit: Payer: PPO | Admitting: Urology

## 2019-08-07 ENCOUNTER — Encounter: Payer: Self-pay | Admitting: Urology

## 2019-08-07 VITALS — Ht 63.5 in | Wt 115.0 lb

## 2019-08-07 DIAGNOSIS — R3129 Other microscopic hematuria: Secondary | ICD-10-CM

## 2019-08-07 DIAGNOSIS — Z87440 Personal history of urinary (tract) infections: Secondary | ICD-10-CM

## 2019-08-07 NOTE — Progress Notes (Signed)
   08/07/19  CC:  Chief Complaint  Patient presents with  . Cysto    HPI: 79 yo F with recurrent UTI/ microscopic hematuria who present today for cystoscopy.    Please see previous notes from [facility].   CT urogram from 07/30/19 shows thickened bladder c/w cystitis, otherwise unremarkable.  There were no vitals taken for this visit. NED. A&Ox3.   No respiratory distress   Abd soft, NT, ND Normal external genitalia with patent urethral meatus  Cystoscopy Procedure Note  Patient identification was confirmed, informed consent was obtained, and patient was prepped using Betadine solution.  Lidocaine jelly was administered per urethral meatus.    Procedure: - Flexible cystoscope introduced, without any difficulty.   - Thorough search of the bladder revealed:    normal urethral meatus    normal urothelium    no stones    no ulcers     no tumors    no urethral polyps    Mild lateral wall trabeculation  - Ureteral orifices were normal in position and appearance.  Post-Procedure: - Patient tolerated the procedure well  Assessment/ Plan:  1. Microscopic hematuria NED, cystoscopy and CT urogram unremarkable - Urinalysis, Complete  2. History of recurrent UTIs F/u shannon, asymptomatic today Continue estrogren cream Return with UTI symptom recurrence   Return in about 6 months (around 02/07/2020) for with [facility]. or sooner as needed  Hollice Espy, MD

## 2019-08-08 LAB — URINALYSIS, COMPLETE
Bilirubin, UA: NEGATIVE
Glucose, UA: NEGATIVE
Ketones, UA: NEGATIVE
Nitrite, UA: NEGATIVE
Protein,UA: NEGATIVE
Specific Gravity, UA: 1.02 (ref 1.005–1.030)
Urobilinogen, Ur: 0.2 mg/dL (ref 0.2–1.0)
pH, UA: 6 (ref 5.0–7.5)

## 2019-08-08 LAB — MICROSCOPIC EXAMINATION

## 2019-08-14 DIAGNOSIS — R0902 Hypoxemia: Secondary | ICD-10-CM | POA: Diagnosis not present

## 2019-08-14 DIAGNOSIS — J309 Allergic rhinitis, unspecified: Secondary | ICD-10-CM | POA: Diagnosis not present

## 2019-08-25 ENCOUNTER — Encounter: Payer: Self-pay | Admitting: Family Medicine

## 2019-08-25 DIAGNOSIS — I771 Stricture of artery: Secondary | ICD-10-CM | POA: Insufficient documentation

## 2019-08-25 DIAGNOSIS — I700 Atherosclerosis of aorta: Secondary | ICD-10-CM | POA: Insufficient documentation

## 2019-09-09 IMAGING — MG MM DIGITAL SCREENING BILAT W/ TOMO W/ CAD
9 of 13 series · 9 of 33 positions shown · non-contrast
Comparison: Previous exam(s).

CLINICAL DATA: Screening. Remote history of benign left breast
biopsy.

EXAM:
DIGITAL SCREENING BILATERAL MAMMOGRAM WITH TOMO AND CAD

[L MLO synth-2D (1 of 2)]
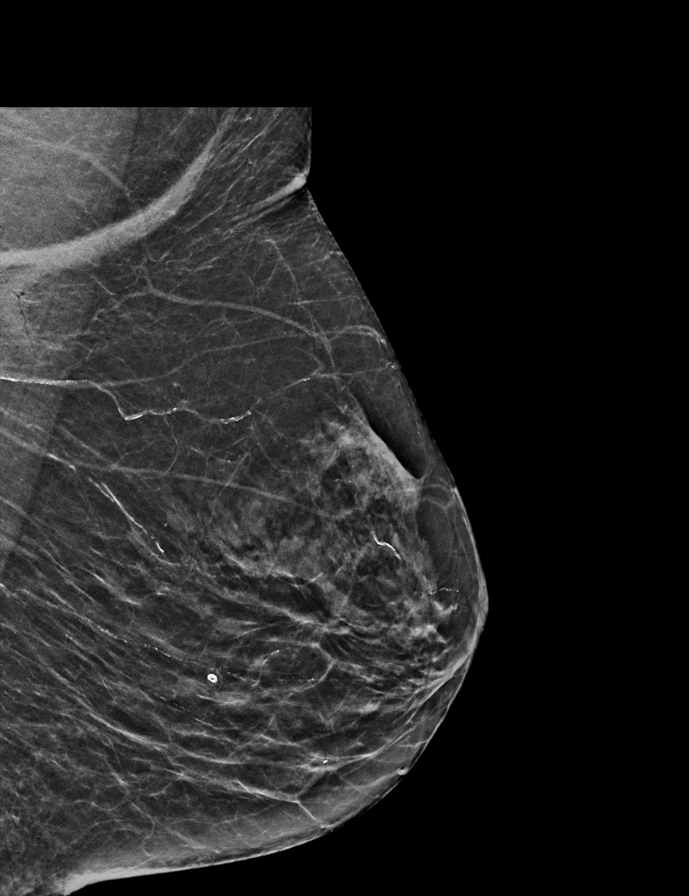

[L CC synth-2D]
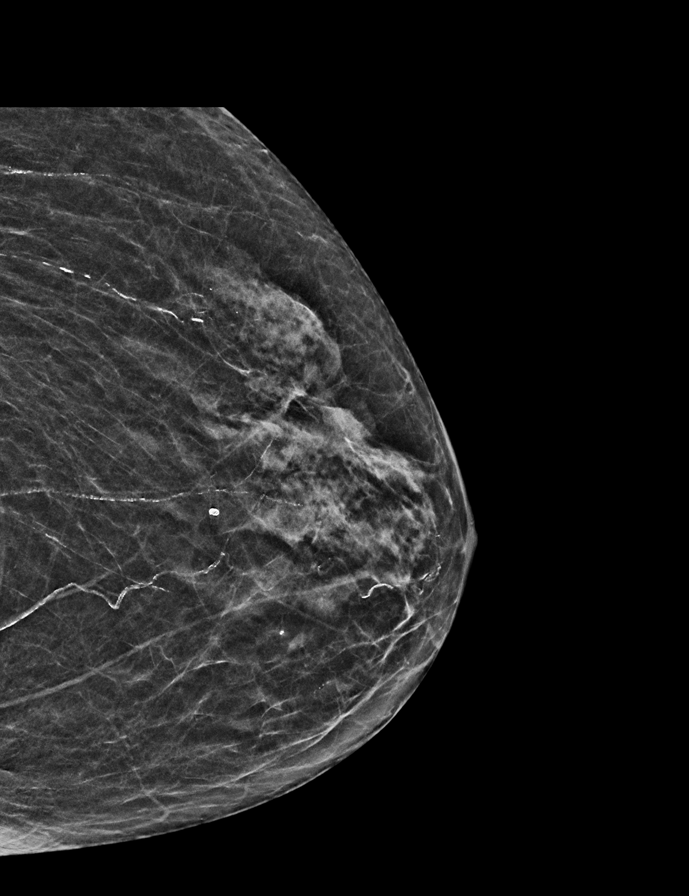

[R MLO synth-2D]
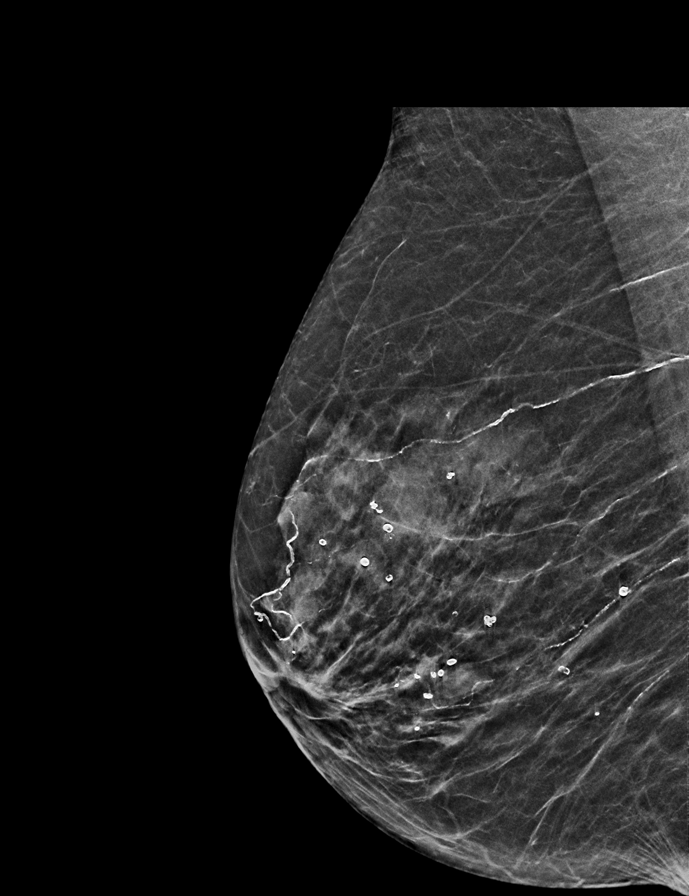

[L MLO synth-2D (2 of 2)]
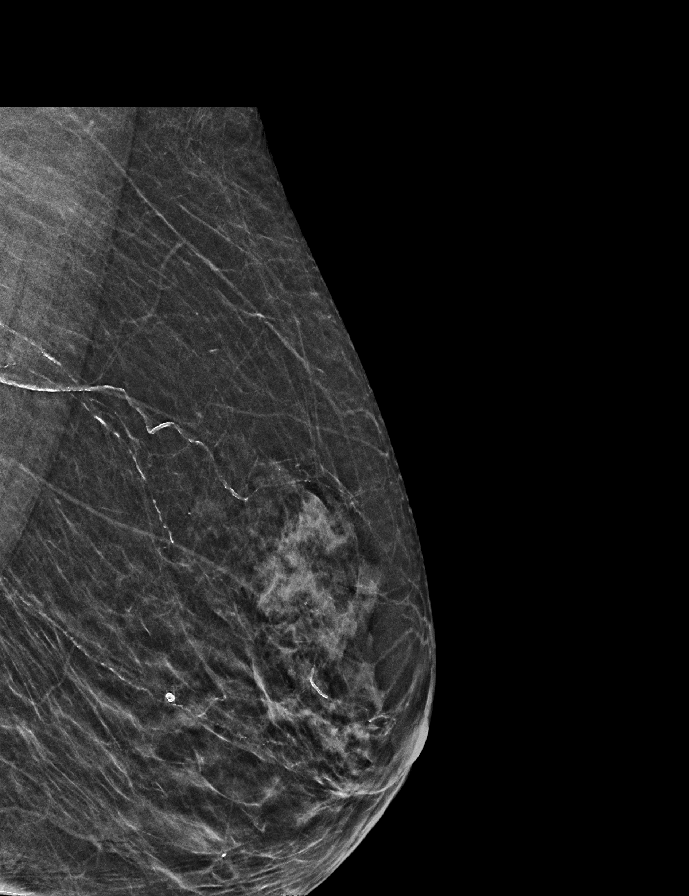

[R CC synth-2D]
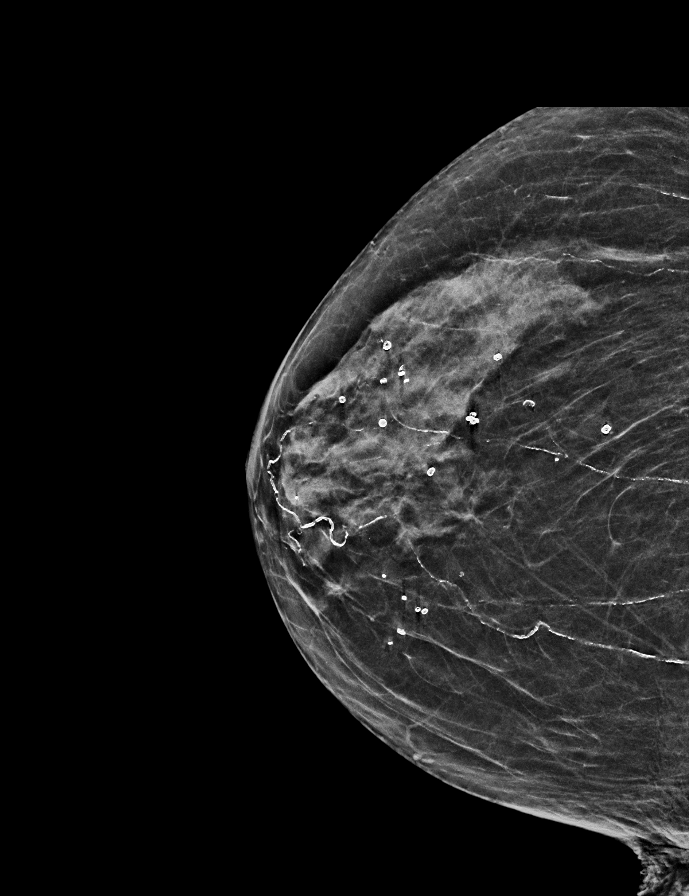

[L MLO tomo · tomo slice 21/41.0]
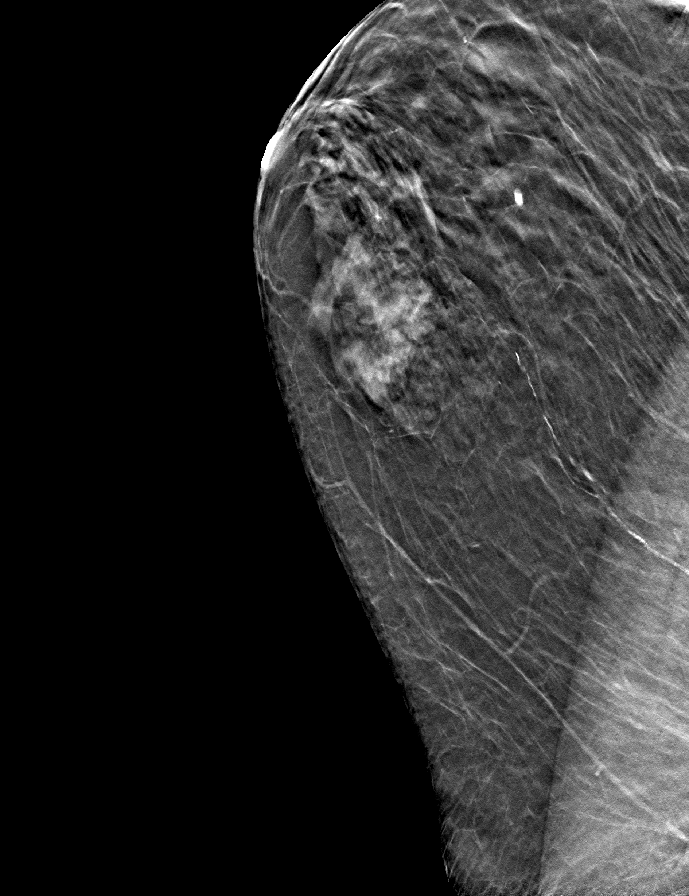

[L MLO]
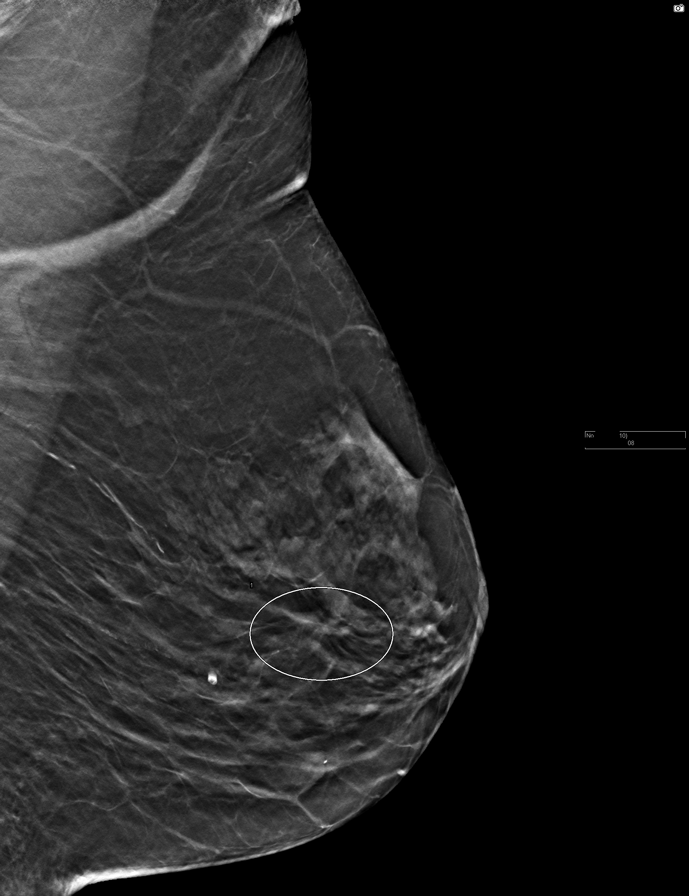

[L CC (1 of 2)]
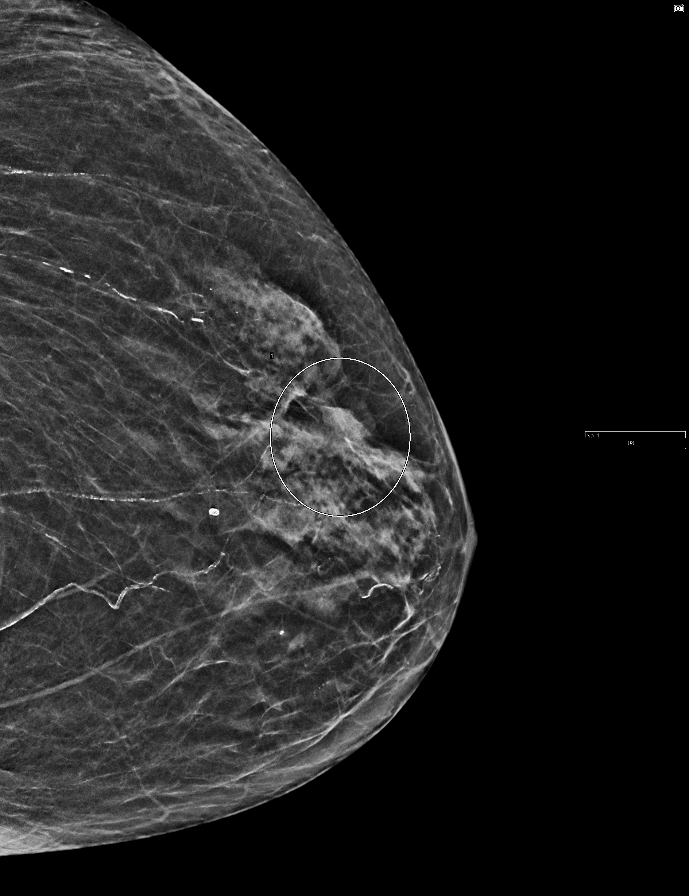

[L CC (2 of 2)]
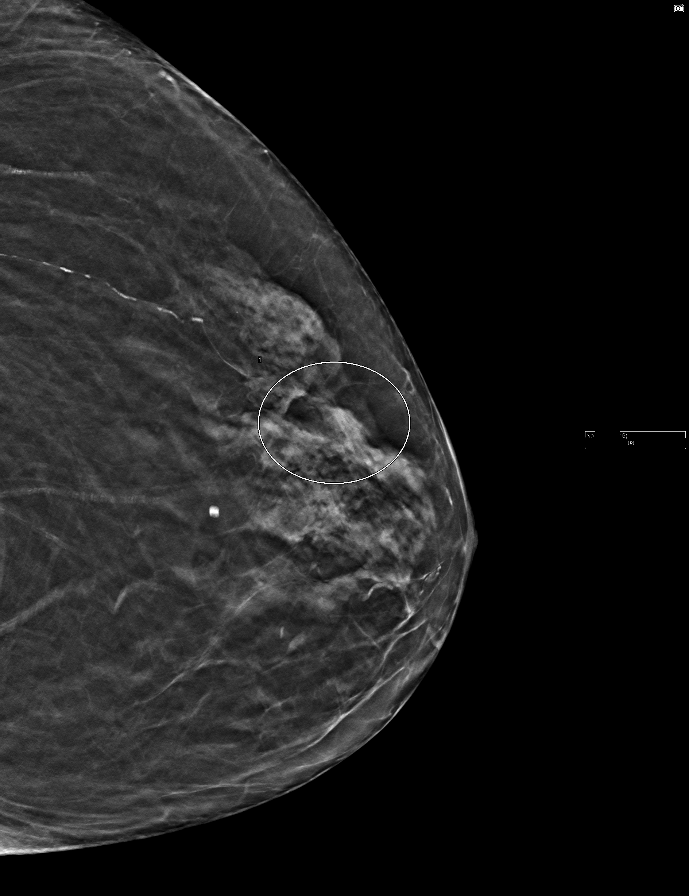

[9 of 33 positions shown; findings below may reference images not displayed]

ACR Breast Density Category c: The breast tissue is heterogeneously
dense, which may obscure small masses.
FINDINGS: In the left breast, possible distortion warrants further evaluation.
In the right breast, no findings suspicious for malignancy. Images
were processed with CAD.
IMPRESSION: Further evaluation is suggested for possible distortion in the left
breast.

RECOMMENDATION:
Diagnostic mammogram and possibly ultrasound of the left breast.
(Code:1X-S-PPH)

The patient will be contacted regarding the findings, and additional
imaging will be scheduled.

BI-RADS CATEGORY  0: Incomplete. Need additional imaging evaluation
and/or prior mammograms for comparison.

## 2019-09-10 DIAGNOSIS — J449 Chronic obstructive pulmonary disease, unspecified: Secondary | ICD-10-CM | POA: Diagnosis not present

## 2019-09-14 DIAGNOSIS — J309 Allergic rhinitis, unspecified: Secondary | ICD-10-CM | POA: Diagnosis not present

## 2019-09-14 DIAGNOSIS — R0902 Hypoxemia: Secondary | ICD-10-CM | POA: Diagnosis not present

## 2019-09-17 ENCOUNTER — Other Ambulatory Visit: Payer: Self-pay

## 2019-09-17 ENCOUNTER — Encounter: Payer: Self-pay | Admitting: Family Medicine

## 2019-09-17 ENCOUNTER — Ambulatory Visit (INDEPENDENT_AMBULATORY_CARE_PROVIDER_SITE_OTHER)
Admission: RE | Admit: 2019-09-17 | Discharge: 2019-09-17 | Disposition: A | Discharge status: Home / Self Care | Payer: PPO | Source: Ambulatory Visit | Attending: Family Medicine | Admitting: Family Medicine

## 2019-09-17 ENCOUNTER — Ambulatory Visit (INDEPENDENT_AMBULATORY_CARE_PROVIDER_SITE_OTHER): Payer: PPO | Admitting: Family Medicine

## 2019-09-17 VITALS — BP 120/62 | HR 64 | Temp 97.7°F | Ht 63.5 in | Wt 122.2 lb

## 2019-09-17 DIAGNOSIS — R0602 Shortness of breath: Secondary | ICD-10-CM

## 2019-09-17 DIAGNOSIS — R0781 Pleurodynia: Secondary | ICD-10-CM | POA: Diagnosis not present

## 2019-09-17 NOTE — Progress Notes (Signed)
[name] Pha T. [name] Courts, MD, [place]  [facility] [place] at [facility] [place], [address]  Phone: [phone]  FAX: [place] - 79 y.o. female  MRN [id number]  Date of Birth: [birth year]  Date: 09/17/2019  PCP: Ria Bush, MD  Referral: Ria Bush, MD  Chief Complaint  Patient presents with  . Fall    2 weeks ago  . Bruised Rib    This visit occurred during the SARS-CoV-2 public health emergency.  Safety protocols were in place, including screening questions prior to the visit, additional usage of staff PPE, and extensive cleaning of exam room while observing appropriate contact time as indicated for disinfecting solutions.   Subjective:   [name] [name] is a 79 y.o. very pleasant female patient with Body mass index is 21.32 kg/m. who presents with the following:  Fall, hurt rib 2 weeks ago.  She fell in the bathroom and struck her anterior thorax.  She does not have any pain elsewhere, and she did not strike anything else including her head at all.  No pain in the arm, shoulder blade, clavicle, neck.  Slipped on bathroom and hit front of chest.     Review of Systems is noted in the HPI, as appropriate   Objective:   BP 120/62   Pulse 64   Temp 97.7 F (36.5 C) (Temporal)   Ht 5' 3.5" (1.613 m)   Wt 122 lb 4 oz (55.5 kg)   SpO2 93% Comment: 3 L O2  BMI 21.32 kg/m   GEN: No acute distress; alert,appropriate. PULM: Breathing comfortably in no respiratory distress PSYCH: Normally interactive.    This portion of the physical examination was chaperoned by Hedy Camara, CMA.  Sternal compression produces no pain.  Entire left side of the patient's ribs are nontender.  Approximately the region of ribs 7 and 8 the patient does have some tenderness to palpation.  Radiology: DG Chest 2 View  Result Date: 09/17/2019 CLINICAL DATA:  Shortness of breath and  suspected right rib fracture. EXAM: CHEST - 2 VIEW COMPARISON:  June 05, 2018 FINDINGS: The lungs are hyperinflated. Mild, stable scarring and/or atelectasis is seen within the lateral aspect of the right lung base. There is a small, stable right pleural effusion. No pneumothorax is identified. The heart size and mediastinal contours are within normal limits. Metallic density surgical clips are seen along the right hilum. A chronic eighth right rib fracture is seen. This is present on the prior study. Radiopaque surgical clips are seen within the right upper quadrant. IMPRESSION: 1. Stable small right pleural effusion with right basilar scarring and/or atelectasis. 2. Stable chronic right eighth rib fracture. Electronically Signed   By: Virgina Norfolk M.D.   On: 09/17/2019 20:03     Assessment and Plan:     ICD-10-CM   1. Rib pain on right side  R07.81 DG Chest 2 View  2. Shortness of breath  R06.02 DG Chest 2 View   Right-sided rib pain, visualized eighth rib fracture, she may have an additional acute fracture not visualized on plain film.  Question chronic versus acute.  Supportive care only.  Likely with her age and medical comorbidities it may take up to 6 to 8 weeks to heal this.  There is no pleural effusion.  Follow-up: No follow-ups on file.  No orders of the defined types were placed in this encounter.  There are no discontinued  medications. Orders Placed This Encounter  Procedures  . DG Chest 2 View    Signed,  Frederico Hamman T. Tambria Pfannenstiel, MD   Outpatient Encounter Medications as of 09/17/2019  Medication Sig  . albuterol (PROAIR HFA) 108 (90 Base) MCG/ACT inhaler Inhale 2 puffs into the lungs every 4 (four) hours as needed.  Marland Kitchen albuterol (PROVENTIL) (2.5 MG/3ML) 0.083% nebulizer solution Take every 4-6 hours as needed for shortness of breath  . arformoterol (BROVANA) 15 MCG/2ML NEBU Take 2 mLs (15 mcg total) by nebulization 2 (two) times daily.  . Biotin 10 MG CAPS Take 1  capsule by mouth daily.  . budesonide (PULMICORT) 0.5 MG/2ML nebulizer solution USE THE CONTENTS OF ONE VIAL (2ML) BY NEBULIZATION TWICE A DAY.  Marland Kitchen buPROPion (WELLBUTRIN SR) 150 MG 12 hr tablet TAKE 1 TABLET BY MOUTH TWICE A DAY  . Calcium Carb-Cholecalciferol (CALCIUM 600+D3) 600-200 MG-UNIT TABS Take 1 tablet by mouth daily.  . cholecalciferol (VITAMIN D) 1000 units tablet Take 2,000 Units by mouth daily.   Marland Kitchen conjugated estrogens (PREMARIN) vaginal cream Place 1 Applicatorful vaginally daily. Apply 0.5mg  (pea-sized amount)  just inside the vaginal introitus with a finger-tip on  Monday, Wednesday and Friday nights.  . CRANBERRY PO Take 2 tablets by mouth every morning.   Marland Kitchen estradiol (ESTRACE VAGINAL) 0.1 MG/GM vaginal cream Apply 0.5mg  (pea-sized amount)  just inside the vaginal introitus with a finger-tip every night for two weeks and then Monday, Wednesday and Friday nights.  . ezetimibe (ZETIA) 10 MG tablet TAKE 1 TABLET BY MOUTH EVERY DAY  . losartan (COZAAR) 25 MG tablet TAKE 1 TABLET BY MOUTH EVERY DAY  . metoprolol tartrate (LOPRESSOR) 25 MG tablet TAKE 1 TABLET BY MOUTH TWICE A DAY  . omeprazole (PRILOSEC) 40 MG capsule TAKE 1 CAPSULE (40 MG TOTAL) BY MOUTH DAILY AS NEEDED (HEARTBURN).  . pravastatin (PRAVACHOL) 80 MG tablet TAKE 1 TABLET BY MOUTH EVERYDAY AT BEDTIME  . psyllium (METAMUCIL) 58.6 % powder Take 1 packet by mouth at bedtime.   . sertraline (ZOLOFT) 100 MG tablet TAKE 1 TABLET EVERY MORNING AND 1/2 TABLET EVERY EVENING  . tiotropium (SPIRIVA) 18 MCG inhalation capsule INHALE THE CONTENTS OF 1 CAPSULE ONE DAILY *NOT FOR ORAL USE*.   No facility-administered encounter medications on file as of 09/17/2019.

## 2019-10-01 ENCOUNTER — Other Ambulatory Visit: Payer: Self-pay

## 2019-10-01 ENCOUNTER — Encounter: Payer: Self-pay | Admitting: Family Medicine

## 2019-10-01 ENCOUNTER — Ambulatory Visit (INDEPENDENT_AMBULATORY_CARE_PROVIDER_SITE_OTHER): Payer: PPO | Admitting: Family Medicine

## 2019-10-01 VITALS — BP 138/80 | HR 68 | Temp 97.8°F | Ht 63.5 in | Wt 122.1 lb

## 2019-10-01 DIAGNOSIS — I739 Peripheral vascular disease, unspecified: Secondary | ICD-10-CM | POA: Diagnosis not present

## 2019-10-01 DIAGNOSIS — J439 Emphysema, unspecified: Secondary | ICD-10-CM | POA: Diagnosis not present

## 2019-10-01 DIAGNOSIS — I700 Atherosclerosis of aorta: Secondary | ICD-10-CM

## 2019-10-01 DIAGNOSIS — I771 Stricture of artery: Secondary | ICD-10-CM

## 2019-10-01 DIAGNOSIS — M8580 Other specified disorders of bone density and structure, unspecified site: Secondary | ICD-10-CM | POA: Diagnosis not present

## 2019-10-01 NOTE — Patient Instructions (Addendum)
You do likely have rib fracture on the right - this will take time to heal.  I do want you to use tylenol 500mg  1-2 a day for next several weeks.

## 2019-10-01 NOTE — Assessment & Plan Note (Signed)
Chronic, stable period on supplemental oxygen at home - continue current regimen.

## 2019-10-01 NOTE — Assessment & Plan Note (Signed)
Reviewed latest dexa - continue calcium/vit D.

## 2019-10-01 NOTE — Assessment & Plan Note (Addendum)
Abd aortic US 05/2019 - >50% R CIA stenosis with significant abd aortic ATH without aneurysm Pt denies claudication symptoms. Will continue to monitor this. Work towards tight BP and cholesterol control. Consider aspirin.

## 2019-10-01 NOTE — Progress Notes (Signed)
This visit was conducted in person.  BP 138/80 (BP Location: Left Arm, Patient Position: Sitting, Cuff Size: Normal)   Pulse 68   Temp 97.8 F (36.6 C) (Temporal)   Ht 5' 3.5" (1.613 m)   Wt 122 lb 2 oz (55.4 kg)   SpO2 93%   BMI 21.29 kg/m    CC: 6 mo f/u visit  Subjective:    [name], female    DOB: Mar 20, 1941, 79 y.o.   MRN: [id number]  HPI: [name] [name] is a 79 y.o. female presenting on 10/01/2019 for Follow-up (Here for 6 mo f/u.)   Oxygen dependent COPD (3L by [place]) - on spiriva, brovana, pulmicort and PRN albuterol.   She had a fall in bathroom - slid off the commode. Rib xray showed R 8th rib fracture. Ongoing pain - not currently taking anything for pain.      Relevant past medical, surgical, family and social history reviewed and updated as indicated. Interim medical history since our last visit reviewed. Allergies and medications reviewed and updated. Outpatient Medications Prior to Visit  Medication Sig Dispense Refill  . albuterol (PROAIR HFA) 108 (90 Base) MCG/ACT inhaler Inhale 2 puffs into the lungs every 4 (four) hours as needed. 18 g 3  . albuterol (PROVENTIL) (2.5 MG/3ML) 0.083% nebulizer solution Take every 4-6 hours as needed for shortness of breath 75 mL 5  . arformoterol (BROVANA) 15 MCG/2ML NEBU Take 2 mLs (15 mcg total) by nebulization 2 (two) times daily. 120 mL 11  . Biotin 10 MG CAPS Take 1 capsule by mouth daily.    . budesonide (PULMICORT) 0.5 MG/2ML nebulizer solution USE THE CONTENTS OF ONE VIAL (2ML) BY NEBULIZATION TWICE A DAY. 60 mL 5  . buPROPion (WELLBUTRIN SR) 150 MG 12 hr tablet TAKE 1 TABLET BY MOUTH TWICE A DAY 180 tablet 1  . Calcium Carb-Cholecalciferol (CALCIUM 600+D3) 600-200 MG-UNIT TABS Take 1 tablet by mouth daily.    . cholecalciferol (VITAMIN D) 1000 units tablet Take 2,000 Units by mouth daily.     Marland Kitchen conjugated estrogens (PREMARIN) vaginal cream Place 1 Applicatorful vaginally daily. Apply 0.5mg   (pea-sized amount)  just inside the vaginal introitus with a finger-tip on  Monday, Wednesday and Friday nights. 30 g 12  . CRANBERRY PO Take 2 tablets by mouth every morning.     Marland Kitchen estradiol (ESTRACE VAGINAL) 0.1 MG/GM vaginal cream Apply 0.5mg  (pea-sized amount)  just inside the vaginal introitus with a finger-tip every night for two weeks and then Monday, Wednesday and Friday nights. 30 g 12  . ezetimibe (ZETIA) 10 MG tablet TAKE 1 TABLET BY MOUTH EVERY DAY 90 tablet 3  . losartan (COZAAR) 25 MG tablet TAKE 1 TABLET BY MOUTH EVERY DAY 90 tablet 3  . metoprolol tartrate (LOPRESSOR) 25 MG tablet TAKE 1 TABLET BY MOUTH TWICE A DAY 180 tablet 3  . omeprazole (PRILOSEC) 40 MG capsule TAKE 1 CAPSULE (40 MG TOTAL) BY MOUTH DAILY AS NEEDED (HEARTBURN). 90 capsule 2  . pravastatin (PRAVACHOL) 80 MG tablet TAKE 1 TABLET BY MOUTH EVERYDAY AT BEDTIME 90 tablet 3  . psyllium (METAMUCIL) 58.6 % powder Take 1 packet by mouth at bedtime.     . sertraline (ZOLOFT) 100 MG tablet TAKE 1 TABLET EVERY MORNING AND 1/2 TABLET EVERY EVENING 135 tablet 1  . tiotropium (SPIRIVA) 18 MCG inhalation capsule INHALE THE CONTENTS OF 1 CAPSULE ONE DAILY *NOT FOR ORAL USE*. 90 capsule 2   No facility-administered medications  prior to visit.     Per HPI unless specifically indicated in ROS section below Review of Systems Objective:  BP 138/80 (BP Location: Left Arm, Patient Position: Sitting, Cuff Size: Normal)   Pulse 68   Temp 97.8 F (36.6 C) (Temporal)   Ht 5' 3.5" (1.613 m)   Wt 122 lb 2 oz (55.4 kg)   SpO2 93%   BMI 21.29 kg/m   Wt Readings from Last 3 Encounters:  10/01/19 122 lb 2 oz (55.4 kg)  09/17/19 122 lb 4 oz (55.5 kg)  08/07/19 115 lb (52.2 kg)      Physical Exam Vitals and nursing note reviewed.  Constitutional:      Appearance: Normal appearance. She is not ill-appearing.     Comments: [place] in place  Cardiovascular:     Rate and Rhythm: Normal rate and regular rhythm.     Pulses: Normal  pulses.     Heart sounds: Normal heart sounds. No murmur.  Pulmonary:     Effort: Pulmonary effort is normal. No respiratory distress.     Breath sounds: No wheezing, rhonchi or rales.     Comments: Coarse bibasilarly Chest:     Chest wall: Tenderness (tender R anterior inferior ribcage) present.  Musculoskeletal:     Right lower leg: No edema.     Left lower leg: No edema.     Comments: 2+ DP on right, diminished on left  Neurological:     Mental Status: She is alert.  Psychiatric:        Mood and Affect: Mood normal.        Behavior: Behavior normal.       DG Chest 2 View CLINICAL DATA:  Shortness of breath and suspected right rib fracture.  EXAM: CHEST - 2 VIEW  COMPARISON:  June 05, 2018  FINDINGS: The lungs are hyperinflated. Mild, stable scarring and/or atelectasis is seen within the lateral aspect of the right lung base. There is a small, stable right pleural effusion. No pneumothorax is identified. The heart size and mediastinal contours are within normal limits. Metallic density surgical clips are seen along the right hilum. A chronic eighth right rib fracture is seen. This is present on the prior study. Radiopaque surgical clips are seen within the right upper quadrant.  IMPRESSION: 1. Stable small right pleural effusion with right basilar scarring and/or atelectasis. 2. Stable chronic right eighth rib fracture.  Electronically Signed   By: Virgina Norfolk M.D.   On: 09/17/2019 20:03   Assessment & Plan:  This visit occurred during the SARS-CoV-2 public health emergency.  Safety protocols were in place, including screening questions prior to the visit, additional usage of staff PPE, and extensive cleaning of exam room while observing appropriate contact time as indicated for disinfecting solutions.   Problem List Items Addressed This Visit    Right iliac artery stenosis (HCC)   PAD (peripheral artery disease) ([place])    Abd aortic US 05/2019 - >50% R  CIA stenosis with significant abd aortic ATH without aneurysm Pt denies claudication symptoms. Will continue to monitor this. Work towards tight BP and cholesterol control. Consider aspirin.       Osteopenia    Reviewed latest dexa - continue calcium/vit D.      GOLD 4 COPD with emphysema - Primary    Chronic, stable period on supplemental oxygen at home - continue current regimen.       Abdominal aortic atherosclerosis ([place])       No orders  of the defined types were placed in this encounter.  No orders of the defined types were placed in this encounter.   Patient Instructions  You do likely have rib fracture on the right - this will take time to heal.  I do want you to use tylenol 500mg  1-2 a day for next several weeks.   Follow up plan: Return in about 6 months (around 04/02/2020) for annual exam, prior fasting for blood work, medicare wellness visit.  Ria Bush, MD

## 2019-10-02 ENCOUNTER — Ambulatory Visit: Payer: PPO | Admitting: Family Medicine

## 2019-10-04 DIAGNOSIS — J449 Chronic obstructive pulmonary disease, unspecified: Secondary | ICD-10-CM | POA: Diagnosis not present

## 2019-10-14 DIAGNOSIS — J309 Allergic rhinitis, unspecified: Secondary | ICD-10-CM | POA: Diagnosis not present

## 2019-10-14 DIAGNOSIS — R0902 Hypoxemia: Secondary | ICD-10-CM | POA: Diagnosis not present

## 2019-10-15 ENCOUNTER — Other Ambulatory Visit: Payer: Self-pay | Admitting: Internal Medicine

## 2019-10-18 DIAGNOSIS — R262 Difficulty in walking, not elsewhere classified: Secondary | ICD-10-CM | POA: Diagnosis not present

## 2019-10-18 DIAGNOSIS — S2241XD Multiple fractures of ribs, right side, subsequent encounter for fracture with routine healing: Secondary | ICD-10-CM | POA: Diagnosis not present

## 2019-10-18 DIAGNOSIS — M6281 Muscle weakness (generalized): Secondary | ICD-10-CM | POA: Diagnosis not present

## 2019-10-24 DIAGNOSIS — S2241XD Multiple fractures of ribs, right side, subsequent encounter for fracture with routine healing: Secondary | ICD-10-CM | POA: Diagnosis not present

## 2019-10-24 DIAGNOSIS — R262 Difficulty in walking, not elsewhere classified: Secondary | ICD-10-CM | POA: Diagnosis not present

## 2019-10-24 DIAGNOSIS — M6281 Muscle weakness (generalized): Secondary | ICD-10-CM | POA: Diagnosis not present

## 2019-10-26 DIAGNOSIS — S2241XD Multiple fractures of ribs, right side, subsequent encounter for fracture with routine healing: Secondary | ICD-10-CM | POA: Diagnosis not present

## 2019-10-26 DIAGNOSIS — R262 Difficulty in walking, not elsewhere classified: Secondary | ICD-10-CM | POA: Diagnosis not present

## 2019-10-26 DIAGNOSIS — M6281 Muscle weakness (generalized): Secondary | ICD-10-CM | POA: Diagnosis not present

## 2019-10-29 DIAGNOSIS — S2241XD Multiple fractures of ribs, right side, subsequent encounter for fracture with routine healing: Secondary | ICD-10-CM | POA: Diagnosis not present

## 2019-10-29 DIAGNOSIS — R262 Difficulty in walking, not elsewhere classified: Secondary | ICD-10-CM | POA: Diagnosis not present

## 2019-10-29 DIAGNOSIS — M6281 Muscle weakness (generalized): Secondary | ICD-10-CM | POA: Diagnosis not present

## 2019-10-31 DIAGNOSIS — M6281 Muscle weakness (generalized): Secondary | ICD-10-CM | POA: Diagnosis not present

## 2019-10-31 DIAGNOSIS — R262 Difficulty in walking, not elsewhere classified: Secondary | ICD-10-CM | POA: Diagnosis not present

## 2019-10-31 DIAGNOSIS — S2241XD Multiple fractures of ribs, right side, subsequent encounter for fracture with routine healing: Secondary | ICD-10-CM | POA: Diagnosis not present

## 2019-11-05 DIAGNOSIS — S2241XD Multiple fractures of ribs, right side, subsequent encounter for fracture with routine healing: Secondary | ICD-10-CM | POA: Diagnosis not present

## 2019-11-05 DIAGNOSIS — M6281 Muscle weakness (generalized): Secondary | ICD-10-CM | POA: Diagnosis not present

## 2019-11-05 DIAGNOSIS — R262 Difficulty in walking, not elsewhere classified: Secondary | ICD-10-CM | POA: Diagnosis not present

## 2019-11-08 DIAGNOSIS — R262 Difficulty in walking, not elsewhere classified: Secondary | ICD-10-CM | POA: Diagnosis not present

## 2019-11-08 DIAGNOSIS — S2241XD Multiple fractures of ribs, right side, subsequent encounter for fracture with routine healing: Secondary | ICD-10-CM | POA: Diagnosis not present

## 2019-11-08 DIAGNOSIS — M6281 Muscle weakness (generalized): Secondary | ICD-10-CM | POA: Diagnosis not present

## 2019-11-12 DIAGNOSIS — S2241XD Multiple fractures of ribs, right side, subsequent encounter for fracture with routine healing: Secondary | ICD-10-CM | POA: Diagnosis not present

## 2019-11-12 DIAGNOSIS — R262 Difficulty in walking, not elsewhere classified: Secondary | ICD-10-CM | POA: Diagnosis not present

## 2019-11-12 DIAGNOSIS — M6281 Muscle weakness (generalized): Secondary | ICD-10-CM | POA: Diagnosis not present

## 2019-11-14 ENCOUNTER — Other Ambulatory Visit: Payer: Self-pay | Admitting: Family Medicine

## 2019-11-14 DIAGNOSIS — J309 Allergic rhinitis, unspecified: Secondary | ICD-10-CM | POA: Diagnosis not present

## 2019-11-14 DIAGNOSIS — R0902 Hypoxemia: Secondary | ICD-10-CM | POA: Diagnosis not present

## 2019-11-14 DIAGNOSIS — M6281 Muscle weakness (generalized): Secondary | ICD-10-CM | POA: Diagnosis not present

## 2019-11-14 DIAGNOSIS — R262 Difficulty in walking, not elsewhere classified: Secondary | ICD-10-CM | POA: Diagnosis not present

## 2019-11-14 DIAGNOSIS — S2241XD Multiple fractures of ribs, right side, subsequent encounter for fracture with routine healing: Secondary | ICD-10-CM | POA: Diagnosis not present

## 2019-11-19 DIAGNOSIS — M6281 Muscle weakness (generalized): Secondary | ICD-10-CM | POA: Diagnosis not present

## 2019-11-19 DIAGNOSIS — R262 Difficulty in walking, not elsewhere classified: Secondary | ICD-10-CM | POA: Diagnosis not present

## 2019-11-19 DIAGNOSIS — S2241XD Multiple fractures of ribs, right side, subsequent encounter for fracture with routine healing: Secondary | ICD-10-CM | POA: Diagnosis not present

## 2019-11-22 DIAGNOSIS — R262 Difficulty in walking, not elsewhere classified: Secondary | ICD-10-CM | POA: Diagnosis not present

## 2019-11-22 DIAGNOSIS — S2241XD Multiple fractures of ribs, right side, subsequent encounter for fracture with routine healing: Secondary | ICD-10-CM | POA: Diagnosis not present

## 2019-11-22 DIAGNOSIS — M6281 Muscle weakness (generalized): Secondary | ICD-10-CM | POA: Diagnosis not present

## 2019-11-27 ENCOUNTER — Telehealth: Payer: Self-pay | Admitting: Internal Medicine

## 2019-11-27 NOTE — Telephone Encounter (Signed)
Called and spoke to pt and relayed below recommendations.  We do not have any availability in July at the [place] office.  Pt declined appt at [facility] office.  Pt stated that she would contact PCP if she felt she needed to be seen.  Pt has been scheduled for OV on 02/20/2020 with Dr. Mortimer Fries for past due 71mo rov.  Nothing further is needed at this time.

## 2019-11-27 NOTE — Telephone Encounter (Signed)
Not seen for greater than 7 months,  Would restart Pulmicort and Brovana nebulizer twice daily. May try Mucinex DM twice daily as needed for cough and congestion. She needs an office visit for evaluation, if not able to come in and will need to contact her primary care provider or urgent care or emergency room visit if we do not have any office visits available  Please contact office for sooner follow up if symptoms do not improve or worsen or seek emergency care

## 2019-11-27 NOTE — Telephone Encounter (Signed)
Dr. Mortimer Fries pt last seen 04/2019 for COPD. Pt is past due for an appt.  Pt reports of increased weakness, sob with exertion, non prod cough and wheezing x1w. She denied fever, chills or sweats.  Using albuterol HFA TID with some relief.  Pt forgets to take her pulmicort and brovana BID. She is only taking PRN.  Not taking any OTC medications.   Tammy Parrett, please advise.

## 2019-11-28 DIAGNOSIS — M6281 Muscle weakness (generalized): Secondary | ICD-10-CM | POA: Diagnosis not present

## 2019-11-28 DIAGNOSIS — S2241XD Multiple fractures of ribs, right side, subsequent encounter for fracture with routine healing: Secondary | ICD-10-CM | POA: Diagnosis not present

## 2019-11-28 DIAGNOSIS — R262 Difficulty in walking, not elsewhere classified: Secondary | ICD-10-CM | POA: Diagnosis not present

## 2019-11-30 DIAGNOSIS — M6281 Muscle weakness (generalized): Secondary | ICD-10-CM | POA: Diagnosis not present

## 2019-11-30 DIAGNOSIS — S2241XD Multiple fractures of ribs, right side, subsequent encounter for fracture with routine healing: Secondary | ICD-10-CM | POA: Diagnosis not present

## 2019-11-30 DIAGNOSIS — R262 Difficulty in walking, not elsewhere classified: Secondary | ICD-10-CM | POA: Diagnosis not present

## 2019-12-03 DIAGNOSIS — J449 Chronic obstructive pulmonary disease, unspecified: Secondary | ICD-10-CM | POA: Diagnosis not present

## 2019-12-04 DIAGNOSIS — S2241XD Multiple fractures of ribs, right side, subsequent encounter for fracture with routine healing: Secondary | ICD-10-CM | POA: Diagnosis not present

## 2019-12-04 DIAGNOSIS — R262 Difficulty in walking, not elsewhere classified: Secondary | ICD-10-CM | POA: Diagnosis not present

## 2019-12-04 DIAGNOSIS — M6281 Muscle weakness (generalized): Secondary | ICD-10-CM | POA: Diagnosis not present

## 2019-12-11 DIAGNOSIS — R262 Difficulty in walking, not elsewhere classified: Secondary | ICD-10-CM | POA: Diagnosis not present

## 2019-12-11 DIAGNOSIS — S2241XD Multiple fractures of ribs, right side, subsequent encounter for fracture with routine healing: Secondary | ICD-10-CM | POA: Diagnosis not present

## 2019-12-11 DIAGNOSIS — M6281 Muscle weakness (generalized): Secondary | ICD-10-CM | POA: Diagnosis not present

## 2019-12-14 DIAGNOSIS — J309 Allergic rhinitis, unspecified: Secondary | ICD-10-CM | POA: Diagnosis not present

## 2019-12-14 DIAGNOSIS — R0902 Hypoxemia: Secondary | ICD-10-CM | POA: Diagnosis not present

## 2019-12-25 ENCOUNTER — Encounter: Payer: Self-pay | Admitting: Family Medicine

## 2019-12-25 ENCOUNTER — Telehealth: Payer: Self-pay

## 2019-12-25 ENCOUNTER — Ambulatory Visit (INDEPENDENT_AMBULATORY_CARE_PROVIDER_SITE_OTHER): Payer: PPO | Admitting: Family Medicine

## 2019-12-25 ENCOUNTER — Ambulatory Visit (INDEPENDENT_AMBULATORY_CARE_PROVIDER_SITE_OTHER)
Admission: RE | Admit: 2019-12-25 | Discharge: 2019-12-25 | Disposition: A | Discharge status: Home / Self Care | Payer: PPO | Source: Ambulatory Visit | Attending: Family Medicine | Admitting: Family Medicine

## 2019-12-25 ENCOUNTER — Other Ambulatory Visit: Payer: Self-pay

## 2019-12-25 VITALS — BP 188/86 | HR 62 | Temp 97.5°F | Ht 63.5 in | Wt 119.3 lb

## 2019-12-25 DIAGNOSIS — J439 Emphysema, unspecified: Secondary | ICD-10-CM

## 2019-12-25 DIAGNOSIS — J449 Chronic obstructive pulmonary disease, unspecified: Secondary | ICD-10-CM | POA: Diagnosis not present

## 2019-12-25 DIAGNOSIS — I10 Essential (primary) hypertension: Secondary | ICD-10-CM

## 2019-12-25 DIAGNOSIS — R5382 Chronic fatigue, unspecified: Secondary | ICD-10-CM | POA: Diagnosis not present

## 2019-12-25 DIAGNOSIS — J9611 Chronic respiratory failure with hypoxia: Secondary | ICD-10-CM | POA: Diagnosis not present

## 2019-12-25 MED ORDER — LOSARTAN POTASSIUM 50 MG PO TABS: 50.0000 mg | ORAL_TABLET | Freq: Every day | ORAL | 1 refills | Status: DC

## 2019-12-25 MED ORDER — SERTRALINE HCL 100 MG PO TABS: 100.0000 mg | ORAL_TABLET | Freq: Every day | ORAL | 1 refills | Status: DC

## 2019-12-25 MED ORDER — PREDNISONE 20 MG PO TABS: ORAL_TABLET | ORAL | 0 refills | Status: DC

## 2019-12-25 NOTE — Progress Notes (Signed)
This visit was conducted in person.  BP (!) 188/86 (BP Location: Right Arm, Patient Position: Sitting, Cuff Size: Normal)   Pulse 62   Temp (!) 97.5 F (36.4 C) (Temporal)   Ht 5' 3.5" (1.613 m)   Wt 119 lb 5 oz (54.1 kg)   SpO2 94% Comment: 3 L  BMI 20.80 kg/m   BP Readings from Last 3 Encounters:  12/25/19 (!) 188/86  10/01/19 138/80  09/17/19 120/62   180/70 on BP recheck CC: fatigue  Subjective:    [name], female    DOB: 23-Apr-1941, 79 y.o.   MRN: [id number]  HPI: [name] [name] is a 79 y.o. female presenting on 12/25/2019 for Fatigue (C/o feeling tired all day, worsening.  Started about 1 yr ago. )   Over the past year noticing increasing fatigue throughout the day. Sleeps well, but doesn't feel rested when awakening. Sleeps on 3L at night. Only sleeps with 1 pillow, no orthopnea or PNdyspnea. Overall steady on her feet, occasionally uses rollator to get to the lunch room.   Known severe COPD with chronic resp failure chronically on 3L O2 by [place].  Also on brovana and pulmicort neb twice daily and spiriva daily with PRN albuterol neb. No fevers, cough but notes increased dyspnea and increased sputum production. Also taking liquid mucinex.  Has pulm appt scheduled 01/2020 in [place] - she is unable to get to [facility] office.  Denies paresthesias.  HTN - Compliant with current antihypertensive regimen of metoprolol 25mg  bid, losartan 25mg  daily. Does not check blood pressures at home but has bp cuff. Denies vision changes, CP/tightness, leg swelling. Some headaches.     Relevant past medical, surgical, family and social history reviewed and updated as indicated. Interim medical history since our last visit reviewed. Allergies and medications reviewed and updated. Outpatient Medications Prior to Visit  Medication Sig Dispense Refill  . albuterol (PROAIR HFA) 108 (90 Base) MCG/ACT inhaler Inhale 2 puffs into the lungs every 4 (four) hours as needed. 18 g 3   . albuterol (PROVENTIL) (2.5 MG/3ML) 0.083% nebulizer solution Take every 4-6 hours as needed for shortness of breath 75 mL 5  . arformoterol (BROVANA) 15 MCG/2ML NEBU Take 2 mLs (15 mcg total) by nebulization 2 (two) times daily. 120 mL 11  . Biotin 10 MG CAPS Take 1 capsule by mouth daily.    . budesonide (PULMICORT) 0.5 MG/2ML nebulizer solution USE THE CONTENTS OF ONE VIAL (2ML) BY NEBULIZATION TWICE A DAY. 180 mL 1  . buPROPion (WELLBUTRIN SR) 150 MG 12 hr tablet TAKE 1 TABLET BY MOUTH TWICE A DAY 180 tablet 1  . Calcium Carb-Cholecalciferol (CALCIUM 600+D3) 600-200 MG-UNIT TABS Take 1 tablet by mouth daily.    . cholecalciferol (VITAMIN D) 1000 units tablet Take 2,000 Units by mouth daily.     Marland Kitchen conjugated estrogens (PREMARIN) vaginal cream Place 1 Applicatorful vaginally daily. Apply 0.5mg  (pea-sized amount)  just inside the vaginal introitus with a finger-tip on  Monday, Wednesday and Friday nights. 30 g 12  . CRANBERRY PO Take 2 tablets by mouth every morning.     . ezetimibe (ZETIA) 10 MG tablet TAKE 1 TABLET BY MOUTH EVERY DAY 90 tablet 3  . metoprolol tartrate (LOPRESSOR) 25 MG tablet TAKE 1 TABLET BY MOUTH TWICE A DAY 180 tablet 3  . pravastatin (PRAVACHOL) 80 MG tablet TAKE 1 TABLET BY MOUTH EVERYDAY AT BEDTIME 90 tablet 3  . psyllium (METAMUCIL) 58.6 % powder Take 1  packet by mouth at bedtime.     Marland Kitchen tiotropium (SPIRIVA) 18 MCG inhalation capsule INHALE THE CONTENTS OF 1 CAPSULE ONE DAILY *NOT FOR ORAL USE*. 90 capsule 2  . losartan (COZAAR) 25 MG tablet TAKE 1 TABLET BY MOUTH EVERY DAY 90 tablet 3  . sertraline (ZOLOFT) 100 MG tablet TAKE 1 TABLET EVERY MORNING AND 1/2 TABLET EVERY EVENING 135 tablet 1  . estradiol (ESTRACE VAGINAL) 0.1 MG/GM vaginal cream Apply 0.5mg  (pea-sized amount)  just inside the vaginal introitus with a finger-tip every night for two weeks and then Monday, Wednesday and Friday nights. (Patient not taking: Reported on 12/25/2019) 30 g 12  . omeprazole  (PRILOSEC) 40 MG capsule TAKE 1 CAPSULE (40 MG TOTAL) BY MOUTH DAILY AS NEEDED (HEARTBURN). (Patient not taking: Reported on 12/25/2019) 90 capsule 2   No facility-administered medications prior to visit.     Per HPI unless specifically indicated in ROS section below Review of Systems Objective:  BP (!) 188/86 (BP Location: Right Arm, Patient Position: Sitting, Cuff Size: Normal)   Pulse 62   Temp (!) 97.5 F (36.4 C) (Temporal)   Ht 5' 3.5" (1.613 m)   Wt 119 lb 5 oz (54.1 kg)   SpO2 94% Comment: 3 L  BMI 20.80 kg/m   Wt Readings from Last 3 Encounters:  12/25/19 119 lb 5 oz (54.1 kg)  10/01/19 122 lb 2 oz (55.4 kg)  09/17/19 122 lb 4 oz (55.5 kg)      Physical Exam Vitals and nursing note reviewed.  Constitutional:      Appearance: Normal appearance. She is underweight. She is not ill-appearing.  Cardiovascular:     Rate and Rhythm: Normal rate and regular rhythm.     Pulses: Normal pulses.     Heart sounds: Normal heart sounds. No murmur heard.   Pulmonary:     Effort: Pulmonary effort is normal. No respiratory distress.     Breath sounds: No wheezing, rhonchi or rales.     Comments: Poor air movement without obvious rales  Musculoskeletal:     Right lower leg: No edema.     Left lower leg: No edema.  Skin:    General: Skin is warm and dry.     Findings: No rash.  Neurological:     Mental Status: She is alert.  Psychiatric:        Mood and Affect: Mood normal.        Behavior: Behavior normal.       Assessment & Plan:  This visit occurred during the SARS-CoV-2 public health emergency.  Safety protocols were in place, including screening questions prior to the visit, additional usage of staff PPE, and extensive cleaning of exam room while observing appropriate contact time as indicated for disinfecting solutions.   Problem List Items Addressed This Visit    GOLD 4 COPD with emphysema - Primary    Chronic on continuous supplemental oxygen at 3L via [place]. Continues  this along with her regular respiratory medication regimen (brovana, pumicort nebs and spiriva nightly). Check ambulatory pulse ox today as well as overnight oximetry (see below). Keep planned pulm f/u.  With increased dyspnea and sputum production, ?mild COPD exacerbation contributing to fatigue - will treat with short prednisone taper.       Relevant Medications   predniSONE (DELTASONE) 20 MG tablet   Other Relevant Orders   DG Chest 2 View (Completed)   Pulse oximetry, overnight   Essential hypertension    Markedly elevated on  exam today - will increase losartan to 50mg  daily and I asked her to start monitoring BP at home. RTC 1 mo HTN f/u visit       Relevant Medications   losartan (COZAAR) 50 MG tablet   Chronic respiratory failure with hypoxia (HCC)   Relevant Orders   Pulse oximetry, overnight   Chronic fatigue    Chronic over the past year - anticipate multifactorial including COPD. Endorses non-restorative sleep - will check overnight oximetry to evaluate for possible hypoxia at night or signs of OSA. Will return for fasting labs, will check for reversible causes of fatigue at that time       Relevant Orders   Vitamin B12 (Completed)   VITAMIN D 25 Hydroxy (Vit-D Deficiency, Fractures) (Completed)   TSH (Completed)   CBC with Differential/Platelet (Completed)   Comprehensive metabolic panel (Completed)       Meds ordered this encounter  Medications  . sertraline (ZOLOFT) 100 MG tablet    Sig: Take 1 tablet (100 mg total) by mouth daily.    Dispense:  90 tablet    Refill:  1    Note new sig  . losartan (COZAAR) 50 MG tablet    Sig: Take 1 tablet (50 mg total) by mouth daily.    Dispense:  90 tablet    Refill:  1    Note new sig  . predniSONE (DELTASONE) 20 MG tablet    Sig: Take two tablets daily for 3 days followed by one tablet daily for 4 days    Dispense:  10 tablet    Refill:  0   Orders Placed This Encounter  Procedures  . DG Chest 2 View    Standing  Status:   Future    Number of Occurrences:   1    Standing Expiration Date:   12/24/2020    Order Specific Question:   Reason for Exam (SYMPTOM  OR DIAGNOSIS REQUIRED)    Answer:   progressive dyspnea known severe COPD    Order Specific Question:   Preferred imaging location?    Answer:   [facility]    Order Specific Question:   Radiology Contrast Protocol - do NOT remove file path    Answer:   [email or web address]  . Vitamin B12    Standing Status:   Future    Number of Occurrences:   1    Standing Expiration Date:   12/24/2020  . VITAMIN D 25 Hydroxy (Vit-D Deficiency, Fractures)    Standing Status:   Future    Number of Occurrences:   1    Standing Expiration Date:   12/24/2020  . TSH    Standing Status:   Future    Number of Occurrences:   1    Standing Expiration Date:   12/24/2020  . CBC with Differential/Platelet    Standing Status:   Future    Number of Occurrences:   1    Standing Expiration Date:   12/24/2020  . Comprehensive metabolic panel    Standing Status:   Future    Number of Occurrences:   1    Standing Expiration Date:   12/24/2020  . Pulse oximetry, overnight    Home overnight oximetry on her baseline 3L supplemental O2 by [place]    Standing Status:   Future    Standing Expiration Date:   12/25/2020    Patient Instructions  Ambulatory pulse ox today.  I will try to schedule you for a home  oxygen test.  Blood pressures were too high today - increase losartan to 50mg  daily - double up on what you have at home, new higher dose will be at pharmacy. Start monitoring blood pressures at home - goal <150/100. Start prednisone course for possible COPD flare contributing to fatigue and shortness of breath.  Return in 1.5 weeks for labs only  Return in 1 month for BP follow up and breathing   Follow up plan: No follow-ups on file.  Ria Bush, MD

## 2019-12-25 NOTE — Telephone Encounter (Signed)
Plz schedule pt for 10 day lab visit and a 1 mo f/u OV.

## 2019-12-25 NOTE — Patient Instructions (Addendum)
Ambulatory pulse ox today.  I will try to schedule you for a home oxygen test.  Blood pressures were too high today - increase losartan to 50mg  daily - double up on what you have at home, new higher dose will be at pharmacy. Start monitoring blood pressures at home - goal <150/100. Start prednisone course for possible COPD flare contributing to fatigue and shortness of breath.  Return in 1.5 weeks for labs only  Return in 1 month for BP follow up and breathing

## 2019-12-28 DIAGNOSIS — J432 Centrilobular emphysema: Secondary | ICD-10-CM | POA: Diagnosis not present

## 2019-12-28 DIAGNOSIS — J309 Allergic rhinitis, unspecified: Secondary | ICD-10-CM | POA: Diagnosis not present

## 2019-12-28 DIAGNOSIS — J449 Chronic obstructive pulmonary disease, unspecified: Secondary | ICD-10-CM | POA: Diagnosis not present

## 2019-12-28 DIAGNOSIS — R0902 Hypoxemia: Secondary | ICD-10-CM | POA: Diagnosis not present

## 2019-12-28 DIAGNOSIS — J439 Emphysema, unspecified: Secondary | ICD-10-CM | POA: Diagnosis not present

## 2020-01-02 DIAGNOSIS — R0683 Snoring: Secondary | ICD-10-CM | POA: Diagnosis not present

## 2020-01-02 DIAGNOSIS — G4730 Sleep apnea, unspecified: Secondary | ICD-10-CM | POA: Diagnosis not present

## 2020-01-03 ENCOUNTER — Other Ambulatory Visit (INDEPENDENT_AMBULATORY_CARE_PROVIDER_SITE_OTHER): Payer: PPO

## 2020-01-03 ENCOUNTER — Other Ambulatory Visit: Payer: Self-pay

## 2020-01-03 DIAGNOSIS — R5382 Chronic fatigue, unspecified: Secondary | ICD-10-CM | POA: Diagnosis not present

## 2020-01-04 LAB — COMPREHENSIVE METABOLIC PANEL
ALT: 20 U/L (ref 0–35)
AST: 20 U/L (ref 0–37)
Albumin: 4.1 g/dL (ref 3.5–5.2)
Alkaline Phosphatase: 31 U/L — ABNORMAL LOW (ref 39–117)
BUN: 14 mg/dL (ref 6–23)
CO2: 37 mEq/L — ABNORMAL HIGH (ref 19–32)
Calcium: 9.1 mg/dL (ref 8.4–10.5)
Chloride: 97 mEq/L (ref 96–112)
Creatinine, Ser: 0.89 mg/dL (ref 0.40–1.20)
GFR: 61.22 mL/min (ref >60.00)
Glucose, Bld: 85 mg/dL (ref 70–99)
Potassium: 4.7 mEq/L (ref 3.5–5.1)
Sodium: 139 mEq/L (ref 135–145)
Total Bilirubin: 0.5 mg/dL (ref 0.2–1.2)
Total Protein: 6.2 g/dL (ref 6.0–8.3)

## 2020-01-04 LAB — CBC WITH DIFFERENTIAL/PLATELET
Basophils Absolute: 0.1 10*3/uL (ref 0.0–0.1)
Basophils Relative: 1.2 % (ref 0.0–3.0)
Eosinophils Absolute: 0.3 10*3/uL (ref 0.0–0.7)
Eosinophils Relative: 3 % (ref 0.0–5.0)
HCT: 38.4 % (ref 36.0–46.0)
Hemoglobin: 12.8 g/dL (ref 12.0–15.0)
Lymphocytes Relative: 31.9 % (ref 12.0–46.0)
Lymphs Abs: 2.7 10*3/uL (ref 0.7–4.0)
MCHC: 33.2 g/dL (ref 30.0–36.0)
MCV: 94.6 fl (ref 78.0–100.0)
Monocytes Absolute: 0.7 10*3/uL (ref 0.1–1.0)
Monocytes Relative: 7.8 % (ref 3.0–12.0)
Neutro Abs: 4.8 10*3/uL (ref 1.4–7.7)
Neutrophils Relative %: 56.1 % (ref 43.0–77.0)
Platelets: 166 10*3/uL (ref 150.0–400.0)
RBC: 4.06 Mil/uL (ref 3.87–5.11)
RDW: 13.1 % (ref 11.5–15.5)
WBC: 8.6 10*3/uL (ref 4.0–10.5)

## 2020-01-04 LAB — TSH: TSH: 1.1 u[IU]/mL (ref 0.35–4.50)

## 2020-01-04 LAB — VITAMIN D 25 HYDROXY (VIT D DEFICIENCY, FRACTURES): VITD: 38.1 ng/mL (ref 30.00–100.00)

## 2020-01-04 LAB — VITAMIN B12: Vitamin B-12: 639 pg/mL (ref 211–911)

## 2020-01-06 NOTE — Assessment & Plan Note (Signed)
Markedly elevated on exam today - will increase losartan to 50mg  daily and I asked her to start monitoring BP at home. RTC 1 mo HTN f/u visit

## 2020-01-06 NOTE — Assessment & Plan Note (Signed)
Chronic over the past year - anticipate multifactorial including COPD. Endorses non-restorative sleep - will check overnight oximetry to evaluate for possible hypoxia at night or signs of OSA. Will return for fasting labs, will check for reversible causes of fatigue at that time

## 2020-01-06 NOTE — Assessment & Plan Note (Addendum)
Chronic on continuous supplemental oxygen at 3L via [place]. Continues this along with her regular respiratory medication regimen (brovana, pumicort nebs and spiriva nightly). Check ambulatory pulse ox today as well as overnight oximetry (see below). Keep planned pulm f/u.  With increased dyspnea and sputum production, ?mild COPD exacerbation contributing to fatigue - will treat with short prednisone taper.

## 2020-01-14 DIAGNOSIS — R0902 Hypoxemia: Secondary | ICD-10-CM | POA: Diagnosis not present

## 2020-01-14 DIAGNOSIS — J309 Allergic rhinitis, unspecified: Secondary | ICD-10-CM | POA: Diagnosis not present

## 2020-01-20 ENCOUNTER — Telehealth: Payer: Self-pay | Admitting: Family Medicine

## 2020-01-20 NOTE — Telephone Encounter (Signed)
plz notify overnight oximetry test returned reassuringly normal - fortunately no signs of overnight oxygen desaturation.

## 2020-01-21 NOTE — Telephone Encounter (Signed)
Spoke with pt relaying Dr. G's message. Pt verbalizes understanding.  

## 2020-01-22 ENCOUNTER — Ambulatory Visit (INDEPENDENT_AMBULATORY_CARE_PROVIDER_SITE_OTHER): Payer: PPO | Admitting: Family Medicine

## 2020-01-22 ENCOUNTER — Other Ambulatory Visit: Payer: Self-pay

## 2020-01-22 ENCOUNTER — Encounter: Payer: Self-pay | Admitting: Family Medicine

## 2020-01-22 VITALS — BP 172/78 | HR 69 | Temp 97.9°F | Ht 63.5 in | Wt 122.3 lb

## 2020-01-22 DIAGNOSIS — R5382 Chronic fatigue, unspecified: Secondary | ICD-10-CM | POA: Diagnosis not present

## 2020-01-22 DIAGNOSIS — J439 Emphysema, unspecified: Secondary | ICD-10-CM

## 2020-01-22 DIAGNOSIS — J9611 Chronic respiratory failure with hypoxia: Secondary | ICD-10-CM

## 2020-01-22 DIAGNOSIS — I10 Essential (primary) hypertension: Secondary | ICD-10-CM | POA: Diagnosis not present

## 2020-01-22 DIAGNOSIS — N3941 Urge incontinence: Secondary | ICD-10-CM | POA: Diagnosis not present

## 2020-01-22 LAB — POC URINALSYSI DIPSTICK (AUTOMATED)
Bilirubin, UA: NEGATIVE
Blood, UA: NEGATIVE
Glucose, UA: NEGATIVE
Ketones, UA: NEGATIVE
Leukocytes, UA: NEGATIVE
Nitrite, UA: NEGATIVE
Protein, UA: NEGATIVE
Spec Grav, UA: 1.015 (ref 1.010–1.025)
Urobilinogen, UA: 0.2 E.U./dL
pH, UA: 7 (ref 5.0–8.0)

## 2020-01-22 MED ORDER — LOSARTAN POTASSIUM 100 MG PO TABS: 100.0000 mg | ORAL_TABLET | Freq: Every day | ORAL | 1 refills | Status: DC

## 2020-01-22 NOTE — Assessment & Plan Note (Addendum)
Chronic, improving. Will increase slowly taper losartan to 75mg  then goal 100mg  dose. I did ask her to monitor blood pressures at home and let me know if consistently >150/100. I also asked her to return 1 wk after starting 100mg  dose for Cr/K check (lab visit only).  Pt agrees with plan.

## 2020-01-22 NOTE — Assessment & Plan Note (Signed)
Notes increasing urinary urgency frequency and incontinence, ongoing for weeks. Check UA to evaluate for UTI.

## 2020-01-22 NOTE — Patient Instructions (Addendum)
Urinalysis today.  Blood pressures are improving but staying too high.  Increase losartan to 75mg  daily (3 of the 25mg  tablets) until you run out, new dose will be 100mg  daily tablets at the pharmacy.  1 week after starting 100mg  dose, come in for lab visit only.

## 2020-01-22 NOTE — Assessment & Plan Note (Signed)
Improved with short prednisone burst - anticipate due to component of COPD exacerbation.

## 2020-01-22 NOTE — Progress Notes (Signed)
This visit was conducted in person.  BP (!) 172/78 (BP Location: Right Arm, Cuff Size: Normal)   Pulse 69   Temp 97.9 F (36.6 C) (Temporal)   Ht 5' 3.5" (1.613 m)   Wt 122 lb 5 oz (55.5 kg)   SpO2 100% Comment: 5 L  BMI 21.33 kg/m   Dropped supplemental O2 down to 3L [place] when she arrives. CC: 1 mo f/u visit Subjective:    [name], female    DOB: January 04, 1941, 79 y.o.   MRN: [id number]  HPI: [name] [name] is a 79 y.o. female presenting on 01/22/2020 for Follow-up (Here for 1 mo f/u.)   See prior note for details.  Recent overnight oximetry returned normal.   Last visit we treated possible mild COPD exacerbation with prednisone course with benefit.   HTN - Compliant with current antihypertensive regimen of metoprolol 25mg  bid, losartan 50mg  daily (recent increase). Does check blood pressures at home and brings log: 130-200s/50-120s. No low blood pressure readings or symptoms of dizziness/syncope.  Denies HA, vision changes, CP/tightness, leg swelling.  She may have forgotten losartan. Occasional dyspnea.   She's been having increased urinary frequency and incontinence. Some nausea. No dysuria, hematuria, abd pain, or flank pain. No fevers.   Recent labwork check for fatigue overall reassuring.      Relevant past medical, surgical, family and social history reviewed and updated as indicated. Interim medical history since our last visit reviewed. Allergies and medications reviewed and updated. Outpatient Medications Prior to Visit  Medication Sig Dispense Refill  . albuterol (PROAIR HFA) 108 (90 Base) MCG/ACT inhaler Inhale 2 puffs into the lungs every 4 (four) hours as needed. 18 g 3  . albuterol (PROVENTIL) (2.5 MG/3ML) 0.083% nebulizer solution Take every 4-6 hours as needed for shortness of breath 75 mL 5  . arformoterol (BROVANA) 15 MCG/2ML NEBU Take 2 mLs (15 mcg total) by nebulization 2 (two) times daily. 120 mL 11  . Biotin 10 MG CAPS Take 1  capsule by mouth daily.    . budesonide (PULMICORT) 0.5 MG/2ML nebulizer solution USE THE CONTENTS OF ONE VIAL (2ML) BY NEBULIZATION TWICE A DAY. 180 mL 1  . buPROPion (WELLBUTRIN SR) 150 MG 12 hr tablet TAKE 1 TABLET BY MOUTH TWICE A DAY 180 tablet 1  . Calcium Carb-Cholecalciferol (CALCIUM 600+D3) 600-200 MG-UNIT TABS Take 1 tablet by mouth daily.    . cholecalciferol (VITAMIN D) 1000 units tablet Take 2,000 Units by mouth daily.     Marland Kitchen conjugated estrogens (PREMARIN) vaginal cream Place 1 Applicatorful vaginally daily. Apply 0.5mg  (pea-sized amount)  just inside the vaginal introitus with a finger-tip on  Monday, Wednesday and Friday nights. 30 g 12  . CRANBERRY PO Take 2 tablets by mouth every morning.     . ezetimibe (ZETIA) 10 MG tablet TAKE 1 TABLET BY MOUTH EVERY DAY 90 tablet 3  . metoprolol tartrate (LOPRESSOR) 25 MG tablet TAKE 1 TABLET BY MOUTH TWICE A DAY 180 tablet 3  . omeprazole (PRILOSEC) 40 MG capsule TAKE 1 CAPSULE (40 MG TOTAL) BY MOUTH DAILY AS NEEDED (HEARTBURN). 90 capsule 2  . pravastatin (PRAVACHOL) 80 MG tablet TAKE 1 TABLET BY MOUTH EVERYDAY AT BEDTIME 90 tablet 3  . psyllium (METAMUCIL) 58.6 % powder Take 1 packet by mouth at bedtime.     . sertraline (ZOLOFT) 100 MG tablet Take 1 tablet (100 mg total) by mouth daily. 90 tablet 1  . tiotropium (SPIRIVA) 18 MCG inhalation capsule  INHALE THE CONTENTS OF 1 CAPSULE ONE DAILY *NOT FOR ORAL USE*. 90 capsule 2  . losartan (COZAAR) 50 MG tablet Take 1 tablet (50 mg total) by mouth daily. 90 tablet 1  . estradiol (ESTRACE VAGINAL) 0.1 MG/GM vaginal cream Apply 0.5mg  (pea-sized amount)  just inside the vaginal introitus with a finger-tip every night for two weeks and then Monday, Wednesday and Friday nights. (Patient not taking: Reported on 12/25/2019) 30 g 12  . predniSONE (DELTASONE) 20 MG tablet Take two tablets daily for 3 days followed by one tablet daily for 4 days 10 tablet 0   No facility-administered medications prior to  visit.     Per HPI unless specifically indicated in ROS section below Review of Systems Objective:  BP (!) 172/78 (BP Location: Right Arm, Cuff Size: Normal)   Pulse 69   Temp 97.9 F (36.6 C) (Temporal)   Ht 5' 3.5" (1.613 m)   Wt 122 lb 5 oz (55.5 kg)   SpO2 100% Comment: 5 L  BMI 21.33 kg/m   Wt Readings from Last 3 Encounters:  01/22/20 122 lb 5 oz (55.5 kg)  12/25/19 119 lb 5 oz (54.1 kg)  10/01/19 122 lb 2 oz (55.4 kg)      Physical Exam Vitals and nursing note reviewed.  Constitutional:      Appearance: Normal appearance. She is not ill-appearing.  Cardiovascular:     Rate and Rhythm: Normal rate and regular rhythm.     Pulses: Normal pulses.     Heart sounds: Normal heart sounds. No murmur heard.   Pulmonary:     Effort: Pulmonary effort is normal. No respiratory distress.     Breath sounds: Wheezing (faint exp) present. No rhonchi or rales.     Comments: Decreased air movement Musculoskeletal:     Right lower leg: No edema.     Left lower leg: No edema.  Neurological:     Mental Status: She is alert.  Psychiatric:        Mood and Affect: Mood normal.        Behavior: Behavior normal.       Results for orders placed or performed in visit on 01/22/20  POCT Urinalysis Dipstick (Automated)  Result Value Ref Range   Color, UA yellow    Clarity, UA clear    Glucose, UA Negative Negative   Bilirubin, UA negative    Ketones, UA negative    Spec Grav, UA 1.015 1.010 - 1.025   Blood, UA negative    pH, UA 7.0 5.0 - 8.0   Protein, UA Negative Negative   Urobilinogen, UA 0.2 0.2 or 1.0 E.U./dL   Nitrite, UA negative    Leukocytes, UA Negative Negative   Assessment & Plan:  This visit occurred during the SARS-CoV-2 public health emergency.  Safety protocols were in place, including screening questions prior to the visit, additional usage of staff PPE, and extensive cleaning of exam room while observing appropriate contact time as indicated for disinfecting  solutions.   Problem List Items Addressed This Visit    Urge incontinence of urine    Notes increasing urinary urgency frequency and incontinence, ongoing for weeks. Check UA to evaluate for UTI.       GOLD 4 COPD with emphysema    Chronic, stable on supplemental 3L O2 by [place] at rest, increases oxygen with exertion.       Essential hypertension - Primary    Chronic, improving. Will increase slowly taper losartan to 75mg   then goal 100mg  dose. I did ask her to monitor blood pressures at home and let me know if consistently >150/100. I also asked her to return 1 wk after starting 100mg  dose for Cr/K check (lab visit only).  Pt agrees with plan.       Relevant Medications   losartan (COZAAR) 100 MG tablet   Other Relevant Orders   Basic metabolic panel   POCT Urinalysis Dipstick (Automated) (Completed)   Chronic respiratory failure with hypoxia (HCC)   Chronic fatigue    Improved with short prednisone burst - anticipate due to component of COPD exacerbation.           Meds ordered this encounter  Medications  . losartan (COZAAR) 100 MG tablet    Sig: Take 1 tablet (100 mg total) by mouth daily.    Dispense:  90 tablet    Refill:  1    Note new sig - fill 100mg  dose not any other   Orders Placed This Encounter  Procedures  . Basic metabolic panel    Standing Status:   Future    Standing Expiration Date:   01/21/2021  . POCT Urinalysis Dipstick (Automated)    Follow up plan: Return in about 11 weeks (around 04/08/2020) for annual exam, prior fasting for blood work, medicare wellness visit.  Ria Bush, MD

## 2020-01-22 NOTE — Assessment & Plan Note (Signed)
Chronic, stable on supplemental 3L O2 by [place] at rest, increases oxygen with exertion.

## 2020-01-24 ENCOUNTER — Emergency Department
Admission: EM | Admit: 2020-01-24 | Discharge: 2020-01-24 | Disposition: A | Discharge status: Home / Self Care | Payer: PPO | Attending: Emergency Medicine | Admitting: Emergency Medicine

## 2020-01-24 ENCOUNTER — Other Ambulatory Visit: Payer: Self-pay

## 2020-01-24 DIAGNOSIS — R0602 Shortness of breath: Secondary | ICD-10-CM | POA: Diagnosis not present

## 2020-01-24 DIAGNOSIS — Z5321 Procedure and treatment not carried out due to patient leaving prior to being seen by health care provider: Secondary | ICD-10-CM | POA: Insufficient documentation

## 2020-01-24 LAB — CBC
HCT: 38.9 % (ref 36.0–46.0)
Hemoglobin: 12.8 g/dL (ref 12.0–15.0)
MCH: 31.8 pg (ref 26.0–34.0)
MCHC: 32.9 g/dL (ref 30.0–36.0)
MCV: 96.8 fL (ref 80.0–100.0)
Platelets: 149 10*3/uL — ABNORMAL LOW (ref 150–400)
RBC: 4.02 MIL/uL (ref 3.87–5.11)
RDW: 11.9 % (ref 11.5–15.5)
WBC: 6.1 10*3/uL (ref 4.0–10.5)
nRBC: 0 % (ref 0.0–0.2)

## 2020-01-24 LAB — BASIC METABOLIC PANEL
Anion gap: 4 — ABNORMAL LOW (ref 5–15)
BUN: 18 mg/dL (ref 8–23)
CO2: 36 mmol/L — ABNORMAL HIGH (ref 22–32)
Calcium: 9.2 mg/dL (ref 8.9–10.3)
Chloride: 98 mmol/L (ref 98–111)
Creatinine, Ser: 0.84 mg/dL (ref 0.44–1.00)
GFR calc Af Amer: >60 mL/min (ref >60)
GFR calc non Af Amer: >60 mL/min (ref >60)
Glucose, Bld: 104 mg/dL — ABNORMAL HIGH (ref 70–99)
Potassium: 4.8 mmol/L (ref 3.5–5.1)
Sodium: 138 mmol/L (ref 135–145)

## 2020-01-24 NOTE — ED Triage Notes (Signed)
PT to ED via POV from home. PT states having trouble getting a good breath, but states this has been going on for years and she is really here for her BP. PT has been recording her BP and it has been 761'Y - 073'X systolic. PT has been taking her meds. No HA/CP.

## 2020-01-25 ENCOUNTER — Telehealth: Payer: Self-pay

## 2020-01-25 NOTE — Telephone Encounter (Signed)
Pt called back and her BP has continued to come down at 10:30 AM BP was 129/68 P 52. Pt is not having any SOB now and pt is not going to UC unless condition changes or worsens. Pt will cb first of next week to give update on pts BP and how she is doing. FYI to Dr Darnell Level.

## 2020-01-25 NOTE — Telephone Encounter (Signed)
Spoke with pt relaying Dr. Synthia Innocent message.  She verbalizes understanding.  Says she started new dose today.  Scheduled lab visit on 02/01/20 at 11:00.

## 2020-01-25 NOTE — Telephone Encounter (Signed)
[facility] - Client TELEPHONE ADVICE RECORD [facility] Patient Name: [name] [name] Gender: Female DOB: September 05, 1940 Age: 79 Y 10 M 1 D Return Phone Number: [phone] (Primary) Address: City/State/Zip: [place] [address] Client [facility] - Client Client Site [place] - Day Physician Ria Bush - MD Contact Type Call Who Is Calling Patient / Member / Family / Caregiver Call Type Triage / Clinical Relationship To Patient Self Return Phone Number [phone] (Primary) Chief Complaint Blood Pressure High Reason for Call Symptomatic / Request for [name] [name] states she has high blood pressure readings 200/111;182/100;172/100;171/185 (all readings from today) No current symptoms. Was given new medication from her doctor and it has helped her today. Translation No Nurse Assessment Nurse: Earley Favor, RN, Suanne Marker Date/Time (Eastern Time): 01/24/2020 4:05:36 PM Confirm and document reason for call. If symptomatic, describe symptoms. ---Caller states she has high blood pressure readings 200/111;182/100;172/100;171/185 (all readings from today) Current 159/94. Takes losartan. Also has COPD and breathing is worse today. Has the patient had close contact with a person known or suspected to have the novel coronavirus illness OR traveled / lives in area with major community spread (including international travel) in the last 14 days from the onset of symptoms? * If Asymptomatic, screen for exposure and travel within the last 14 days. ---No Does the patient have any new or worsening symptoms? ---Yes Will a triage be completed? ---Yes Related visit to physician within the last 2 weeks? ---Yes Does the PT have any chronic conditions? (i.e. diabetes, asthma, this includes High risk factors for pregnancy, etc.) ---Yes List chronic conditions. ---HTN, COPD Is this a  behavioral health or substance abuse call? ---No Guidelines Guideline Title Affirmed Question Affirmed Notes Nurse Date/Time (Eastern Time) Blood Pressure - High [6] Systolic BP >= 789 OR Diastolic >= 381 AND [0] cardiac or Earley Favor, RN, Suanne Marker 01/24/2020 4:09:18 PM PLEASE NOTE: All timestamps contained within this report are represented as [place] Standard Time. CONFIDENTIALTY NOTICE: This fax transmission is intended only for the addressee. It contains information that is legally privileged, confidential or otherwise protected from use or disclosure. If you are not the intended recipient, you are strictly prohibited from reviewing, disclosing, copying using or disseminating any of this information or taking any action in reliance on or regarding this information. If you have received this fax in error, please notify us immediately by telephone so that we can arrange for its return to [place]. Phone: [phone], Toll-Free: [phone], Fax: [phone] Page: 2 of 2 Call Id: [id number] Guidelines Guideline Title Affirmed Question Affirmed Notes Nurse Date/Time [name] Time) neurologic symptoms (e.g., chest pain, difficulty breathing, unsteady gait, blurred vision) Disp. Time [name] Time) Disposition Final User 01/24/2020 4:11:07 PM Go to ED Now Yes Earley Favor, RN, Rosalyn Charters Disagree/Comply Comply Caller Understands Yes PreDisposition Did not know what to do Care Advice Given Per Guideline GO TO ED NOW: NOTE TO TRIAGER - DRIVING: * Another adult should drive. CARE ADVICE given per High Blood Pressure (Adult) guideline. CALL EMS 911 IF: * Patient passes out, starts acting confused or becomes too weak to stand. * You become worse. Referrals GO TO FACILITY UNDECIDED

## 2020-01-25 NOTE — Telephone Encounter (Signed)
Noted. Thank you.  Ok to continue higher losartan dose 100mg  daily.  Seen at ER yesterday but didn't stay to be seen, labs were reassuring.  plz remind her to schedule lab visit 1 wk after starting losartan 100mg  dose.

## 2020-01-25 NOTE — Telephone Encounter (Signed)
I spoke with pt; pt said she took the Losartan 100 mg this morning and BP is 193/107 P 59.No H/A,dizziness or CP but SOB upon exertion. Pt will go to [facility] this morning. FYI to Dr Darnell Level.

## 2020-01-31 ENCOUNTER — Other Ambulatory Visit: Payer: Self-pay

## 2020-01-31 ENCOUNTER — Other Ambulatory Visit (INDEPENDENT_AMBULATORY_CARE_PROVIDER_SITE_OTHER): Payer: PPO

## 2020-01-31 DIAGNOSIS — I10 Essential (primary) hypertension: Secondary | ICD-10-CM

## 2020-01-31 LAB — BASIC METABOLIC PANEL
BUN: 12 mg/dL (ref 6–23)
CO2: 37 mEq/L — ABNORMAL HIGH (ref 19–32)
Calcium: 9.5 mg/dL (ref 8.4–10.5)
Chloride: 98 mEq/L (ref 96–112)
Creatinine, Ser: 0.86 mg/dL (ref 0.40–1.20)
GFR: 63.68 mL/min (ref >60.00)
Glucose, Bld: 111 mg/dL — ABNORMAL HIGH (ref 70–99)
Potassium: 4.5 mEq/L (ref 3.5–5.1)
Sodium: 140 mEq/L (ref 135–145)

## 2020-02-01 ENCOUNTER — Other Ambulatory Visit: Payer: PPO

## 2020-02-05 ENCOUNTER — Other Ambulatory Visit: Payer: Self-pay | Admitting: Family Medicine

## 2020-02-06 NOTE — Progress Notes (Signed)
06/14/2018  1:41 PM   [name] [name] 02/16/1941 [id number]  Referring provider: Ria Bush, MD [address] [place],  [place] [address]  Chief Complaint  Patient presents with  . Urinary Incontinence    HPI: [name] [name] is a 79 y.o. female who presents today for annual follow-up for high risk hematuria, vaginal atrophy, a history of recurrent urinary tract infections and urge incontinence.     High risk hematuria Former smoker.  CTU 07/2019 Adrenal glands are within normal limits.  Kidneys are within normal limits. No enhancing renal lesions. No renal, ureteral, or bladder calculi. No hydronephrosis.  Calcification in the left upper kidney (coronal image 86) corresponds to a left renal vascular calcification.  On delayed imaging, there are no filling defects in the bilateral opacified proximal collecting systems, ureters, or bladder.  Mildly thick-walled bladder.  Cystoscopy with Dr. Erlene Quan 07/2019 NED.  No reports of gross hematuria.  Her UA today 3-10 RBC's.    Vaginal atrophy Patient is using her vaginal estrogen occasionally.  She is trying to use the cream, but she has a hard time remembering to apply the cream.    History of recurrent urinary tract infections No recent UTI.  She is asymptomatic at today's visit.    Urge incontinence The patient is experiencing urgency x 0-3 (stable), frequency x 4-7 (improved), not restricting fluids to avoid visits to the restroom x 0-3, is engaging in toilet mapping, incontinence x 0-3 (stable) and nocturia x 0-3 (stable).   Her BP is 130/82  Her PVR is 52 mL.  Patient denies any modifying or aggravating factors.  Patient denies any gross hematuria, dysuria or suprapubic/flank pain.  Patient denies any fevers, chills, nausea or vomiting.    PMH: Past Medical History:  Diagnosis Date  . Chronic cystitis    recurrent UTI (4+ in last year), on suppressive doxy (uro [place])  . Colon polyp   . COPD, severe 01/27/07       . Depression with anxiety   . Diverticular disease 10/09/09   CT abd no sigmoid mass DDD L5/S1  . Dyslipidemia   . GERD (gastroesophageal reflux disease)   . Hematuria    h/o kidney stones  . Hiatal hernia   . Hypertension   . Left scapholunate ligament tear 2014   found on imaging  . Lung cancer ([place]) 1987   Partial RLL Lobectomy.  . Osteoarthritis   . Osteopenia 08/2011   DEXA: L femur -1.6  . Peptic ulcer   . Spinal abscess ([place]) 2000   secondary to periodontal disease    Surgical History: Past Surgical History:  Procedure Laterality Date  . BLADDER SURGERY    . BREAST BIOPSY Left 03/99   neg  . CHOLECYSTECTOMY  02/10/09  . COLONOSCOPY WITH PROPOFOL N/A 04/30/2014   Procedure: COLONOSCOPY WITH PROPOFOL;  Surgeon: Irene Shipper, MD;  Location: [facility];  Service: Endoscopy;  Laterality: N/A;  . CYSTOSCOPY  02/11/07 and 09/25/08   Dr. Jacqlyn Larsen, normal  . DEXA  08/2011   T femur -1.6, T spine -0.6  . [place] pneumonia sepsis due to gum surgery  . OOPHORECTOMY    . REPLACEMENT TOTAL KNEE  09/2009   Right  . TOTAL ABDOMINAL HYSTERECTOMY  07/18/01   benign reason, BSO, vag repair secondary prolapse  . TUBAL LIGATION     bilateral    Home Medications:  Allergies as of 02/07/2020  Reactions   Keflex [cephalexin] Shortness Of Breath   Penicillins    REACTION: hives      Medication List       Accurate as of February 07, 2020  1:41 PM. If you have any questions, ask your nurse or doctor.        STOP taking these medications   omeprazole 40 MG capsule Commonly known as: PRILOSEC Stopped by: Zara Council, PA-C     TAKE these medications   albuterol (2.5 MG/3ML) 0.083% nebulizer solution Commonly known as: PROVENTIL Take every 4-6 hours as needed for shortness of breath   albuterol 108 (90 Base) MCG/ACT inhaler Commonly known as: ProAir HFA Inhale 2 puffs into the lungs every 4 (four) hours as needed.   arformoterol 15  MCG/2ML Nebu Commonly known as: BROVANA Take 2 mLs (15 mcg total) by nebulization 2 (two) times daily.   Biotin 10 MG Caps Take 1 capsule by mouth daily.   budesonide 0.5 MG/2ML nebulizer solution Commonly known as: PULMICORT USE THE CONTENTS OF ONE VIAL (2ML) BY NEBULIZATION TWICE A DAY.   buPROPion 150 MG 12 hr tablet Commonly known as: WELLBUTRIN SR TAKE 1 TABLET BY MOUTH TWICE A DAY   Calcium 600+D3 600-200 MG-UNIT Tabs Generic drug: Calcium Carb-Cholecalciferol Take 1 tablet by mouth daily.   cholecalciferol 1000 units tablet Commonly known as: VITAMIN D Take 2,000 Units by mouth daily.   CRANBERRY PO Take 2 tablets by mouth every morning.   ezetimibe 10 MG tablet Commonly known as: ZETIA TAKE 1 TABLET BY MOUTH EVERY DAY   losartan 100 MG tablet Commonly known as: COZAAR Take 1 tablet (100 mg total) by mouth daily.   metoprolol tartrate 25 MG tablet Commonly known as: LOPRESSOR TAKE 1 TABLET BY MOUTH TWICE A DAY   pravastatin 80 MG tablet Commonly known as: PRAVACHOL TAKE 1 TABLET BY MOUTH EVERYDAY AT BEDTIME   Premarin vaginal cream Generic drug: conjugated estrogens Place 1 Applicatorful vaginally daily. Apply 0.5mg  (pea-sized amount)  just inside the vaginal introitus with a finger-tip on  Monday, Wednesday and Friday nights.   psyllium 58.6 % powder Commonly known as: METAMUCIL Take 1 packet by mouth at bedtime.   sertraline 100 MG tablet Commonly known as: ZOLOFT Take 1 tablet (100 mg total) by mouth daily.   tiotropium 18 MCG inhalation capsule Commonly known as: SPIRIVA INHALE THE CONTENTS OF 1 CAPSULE ONE DAILY *NOT FOR ORAL USE*.       Allergies:  Allergies  Allergen Reactions  . Keflex [Cephalexin] Shortness Of Breath  . Penicillins     REACTION: hives    Family History: Family History  Problem Relation Age of Onset  . Pancreatitis Mother   . Heart disease Father   . Hypertension Father   . Emphysema Father   . Arthritis  Brother   . Hypertension Brother   . Cancer Maternal Aunt        ovarian cancer  . Kidney disease Neg Hx   . Prostate cancer Neg Hx   . Bladder Cancer Neg Hx     Social History:  reports that she quit smoking about 24 years ago. Her smoking use included cigarettes. She has a 102.00 pack-year smoking history. She has never used smokeless tobacco. She reports current alcohol use. She reports that she does not use drugs.  ROS: For pertinent review of systems please refer to history of present illness  Physical Exam: BP 130/82   Pulse 70   Wt 120 lb (  54.4 kg)   BMI 20.60 kg/m   Constitutional:  Well nourished. Alert and oriented, No acute distress. HEENT: [place] AT, mask in place.  Trachea midline Cardiovascular: No clubbing, cyanosis, or edema. Respiratory: Normal respiratory effort, no increased work of breathing. Neurologic: Grossly intact, no focal deficits, moving all 4 extremities. Psychiatric: Normal mood and affect.   Laboratory Data: Urinalysis Component     Latest Ref Rng & Units 02/07/2020  Specific Gravity, UA     1.005 - 1.030 1.015  pH, UA     5.0 - 7.5 6.5  Color, UA     Yellow Yellow  Appearance Ur     Clear Clear  Leukocytes,UA     Negative Negative  Protein,UA     Negative/Trace Negative  Glucose, UA     Negative Negative  Ketones, UA     Negative Negative  RBC, UA     Negative Trace (A)  Bilirubin, UA     Negative Negative  Urobilinogen, Ur     0.2 - 1.0 mg/dL 0.2  Nitrite, UA     Negative Negative  Microscopic Examination      See below:   Component     Latest Ref Rng & Units 02/07/2020  WBC, UA     0 - 5 /hpf 0-5  RBC     0 - 2 /hpf 3-10 (A)  Epithelial Cells (non renal)     0 - 10 /hpf 0-10  Bacteria, UA     None seen/Few None seen   Lab Results  Component Value Date   WBC 6.1 01/24/2020   HGB 12.8 01/24/2020   HCT 38.9 01/24/2020   MCV 96.8 01/24/2020   PLT 149 (L) 01/24/2020    Lab Results  Component Value Date   CREATININE  0.86 01/31/2020     Lab Results  Component Value Date   TSH 1.10 01/03/2020       Component Value Date/Time   CHOL 156 03/27/2019 1140   HDL 71.70 03/27/2019 1140   CHOLHDL 2 03/27/2019 1140   VLDL 16.6 03/27/2019 1140   LDLCALC 68 03/27/2019 1140    Lab Results  Component Value Date   AST 20 01/03/2020   Lab Results  Component Value Date   ALT 20 01/03/2020   I have reviewed the labs.  Pertinent Imaging Results for ONNA, NODAL (MRN [id number]) as of 02/14/2020 16:12  Ref. Range 02/07/2020 13:23  Scan Result Unknown 52 ML     Assessment & Plan:    1. High Risk Hematuria - hematuria work up completed 07/2019 with CTU and cysto - NED - UA today 3-10 RBC's - may be due to the episode of malignant HTN last week  - discussed referral to nephrology, but patient deferred - she is following up with her PCP regarding her BP - No reports of gross hematuria - RTC in one year for UA recheck  2. Vaginal atrophy - encouraged to apply three nights weekly   3. Urge incontinence - improved - continue conservative measures   Return in about 1 year (around 02/06/2021) for UA and symptom recheck .  These notes generated with voice recognition software. I apologize for typographical errors.  Zara Council, PA-C  [facility] [address], [place], [place] [address] [phone]

## 2020-02-07 ENCOUNTER — Ambulatory Visit: Payer: PPO | Admitting: Urology

## 2020-02-07 ENCOUNTER — Encounter: Payer: Self-pay | Admitting: Urology

## 2020-02-07 ENCOUNTER — Other Ambulatory Visit: Payer: Self-pay

## 2020-02-07 VITALS — BP 130/82 | HR 70 | Wt 120.0 lb

## 2020-02-07 DIAGNOSIS — N3941 Urge incontinence: Secondary | ICD-10-CM

## 2020-02-07 DIAGNOSIS — R3129 Other microscopic hematuria: Secondary | ICD-10-CM | POA: Diagnosis not present

## 2020-02-07 DIAGNOSIS — N952 Postmenopausal atrophic vaginitis: Secondary | ICD-10-CM

## 2020-02-07 LAB — BLADDER SCAN AMB NON-IMAGING: Scan Result: 52

## 2020-02-08 LAB — URINALYSIS, COMPLETE
Bilirubin, UA: NEGATIVE
Glucose, UA: NEGATIVE
Ketones, UA: NEGATIVE
Leukocytes,UA: NEGATIVE
Nitrite, UA: NEGATIVE
Protein,UA: NEGATIVE
Specific Gravity, UA: 1.015 (ref 1.005–1.030)
Urobilinogen, Ur: 0.2 mg/dL (ref 0.2–1.0)
pH, UA: 6.5 (ref 5.0–7.5)

## 2020-02-08 LAB — MICROSCOPIC EXAMINATION: Bacteria, UA: NONE SEEN

## 2020-02-14 DIAGNOSIS — R0902 Hypoxemia: Secondary | ICD-10-CM | POA: Diagnosis not present

## 2020-02-14 DIAGNOSIS — J309 Allergic rhinitis, unspecified: Secondary | ICD-10-CM | POA: Diagnosis not present

## 2020-02-20 ENCOUNTER — Encounter: Payer: Self-pay | Admitting: Internal Medicine

## 2020-02-20 ENCOUNTER — Ambulatory Visit (INDEPENDENT_AMBULATORY_CARE_PROVIDER_SITE_OTHER): Payer: PPO | Admitting: Internal Medicine

## 2020-02-20 ENCOUNTER — Other Ambulatory Visit: Payer: Self-pay

## 2020-02-20 VITALS — BP 102/68 | HR 58 | Temp 97.5°F | Ht 64.0 in | Wt 121.8 lb

## 2020-02-20 DIAGNOSIS — J449 Chronic obstructive pulmonary disease, unspecified: Secondary | ICD-10-CM

## 2020-02-20 DIAGNOSIS — J9611 Chronic respiratory failure with hypoxia: Secondary | ICD-10-CM | POA: Diagnosis not present

## 2020-02-20 DIAGNOSIS — R0689 Other abnormalities of breathing: Secondary | ICD-10-CM

## 2020-02-20 MED ORDER — PREDNISONE 20 MG PO TABS: 20.0000 mg | ORAL_TABLET | Freq: Every day | ORAL | 1 refills | Status: DC

## 2020-02-20 MED ORDER — LEVOFLOXACIN 500 MG PO TABS: 500.0000 mg | ORAL_TABLET | Freq: Every day | ORAL | 1 refills | Status: AC

## 2020-02-20 NOTE — Progress Notes (Signed)
[facility]    Date: 02/20/2020  MRN# [id number] SHIRA BOBST 05-24-1941     SYNOPSIS The patient is a 79 year old female with a history of severe COPD maintained on nebulized Brovana, Pulmicort, Spiriva, Mucinex twice daily She uses oxygen at 3 L sometimes to 4 L with exertion. She is using brovana, pulmicort bid. She also uses albuterol MDI and nebulized about 2 to 3 times per day. She tries to remain active, walking with a walker. She lives in independent living, [place].  She continues to drive.   CC Follow up COPD  HPI  No exacerbation at this time No evidence of heart failure at this time No evidence or signs of infection at this time No respiratory distress No fevers, chills, nausea, vomiting, diarrhea No evidence of lower extremity edema No evidence hemoptysis  Uses oxygen to survive  CXR 07/01/16>>findings of severe emphysema without significant interstitial lung disease.  Medication:    Current Outpatient Medications:  .  albuterol (PROAIR HFA) 108 (90 Base) MCG/ACT inhaler, Inhale 2 puffs into the lungs every 4 (four) hours as needed., Disp: 18 g, Rfl: 3 .  albuterol (PROVENTIL) (2.5 MG/3ML) 0.083% nebulizer solution, Take every 4-6 hours as needed for shortness of breath, Disp: 75 mL, Rfl: 5 .  arformoterol (BROVANA) 15 MCG/2ML NEBU, Take 2 mLs (15 mcg total) by nebulization 2 (two) times daily., Disp: 120 mL, Rfl: 11 .  budesonide (PULMICORT) 0.5 MG/2ML nebulizer solution, USE THE CONTENTS OF ONE VIAL (2ML) BY NEBULIZATION TWICE A DAY., Disp: 180 mL, Rfl: 1 .  buPROPion (WELLBUTRIN SR) 150 MG 12 hr tablet, TAKE 1 TABLET BY MOUTH TWICE A DAY, Disp: 180 tablet, Rfl: 1 .  Calcium Carb-Cholecalciferol (CALCIUM 600+D3) 600-200 MG-UNIT TABS, Take 1 tablet by mouth daily., Disp: , Rfl:  .  cholecalciferol (VITAMIN D) 1000 units tablet, Take 2,000 Units by mouth daily. , Disp: , Rfl:  .  CRANBERRY PO, Take 2 tablets by mouth every morning.  , Disp: , Rfl:  .  ezetimibe (ZETIA) 10 MG tablet, TAKE 1 TABLET BY MOUTH EVERY DAY, Disp: 90 tablet, Rfl: 3 .  losartan (COZAAR) 100 MG tablet, Take 1 tablet (100 mg total) by mouth daily., Disp: 90 tablet, Rfl: 1 .  metoprolol tartrate (LOPRESSOR) 25 MG tablet, TAKE 1 TABLET BY MOUTH TWICE A DAY, Disp: 180 tablet, Rfl: 3 .  pravastatin (PRAVACHOL) 80 MG tablet, TAKE 1 TABLET BY MOUTH EVERYDAY AT BEDTIME, Disp: 90 tablet, Rfl: 3 .  psyllium (METAMUCIL) 58.6 % powder, Take 1 packet by mouth at bedtime. , Disp: , Rfl:  .  sertraline (ZOLOFT) 100 MG tablet, Take 1 tablet (100 mg total) by mouth daily., Disp: 90 tablet, Rfl: 1 .  tiotropium (SPIRIVA) 18 MCG inhalation capsule, INHALE THE CONTENTS OF 1 CAPSULE ONE DAILY *NOT FOR ORAL USE*., Disp: 90 capsule, Rfl: 2 .  Biotin 10 MG CAPS, Take 1 capsule by mouth daily. (Patient not taking: Reported on 02/20/2020), Disp: , Rfl:  .  conjugated estrogens (PREMARIN) vaginal cream, Place 1 Applicatorful vaginally daily. Apply 0.5mg  (pea-sized amount)  just inside the vaginal introitus with a finger-tip on  Monday, Wednesday and Friday nights. (Patient not taking: Reported on 02/20/2020), Disp: 30 g, Rfl: 12   Allergies:  Keflex [cephalexin] and Penicillins   Review of Systems:  Gen:  Denies  fever, sweats, chills weight loss  HEENT: Denies blurred vision, double vision, ear pain, eye pain, hearing loss, nose bleeds, sore throat  Cardiac:  No dizziness, chest pain or heaviness, chest tightness,edema, No JVD Resp:   No cough, -sputum production, -shortness of breath,-wheezing, -hemoptysis,  Gi: Denies swallowing difficulty, stomach pain, nausea or vomiting, diarrhea, constipation, bowel incontinence Gu:  Denies bladder incontinence, burning urine Ext:   Denies Joint pain, stiffness or swelling Skin: Denies  skin rash, easy bruising or bleeding or hives Endoc:  Denies polyuria, polydipsia , polyphagia or weight change Psych:   Denies depression, insomnia  or hallucinations  Other:  All other systems negative  BP 102/68 (BP Location: Left Arm, Patient Position: Sitting, Cuff Size: Normal)   Pulse (!) 58   Temp (!) 97.5 F (36.4 C) (Temporal)   Ht 5\' 4"  (1.626 m)   Wt 121 lb 12.8 oz (55.2 kg)   SpO2 95%   BMI 20.91 kg/m   Physical Examination:   General Appearance: No distress  Neuro:without focal findings,  speech normal,  HEENT: PERRLA, EOM intact.   Pulmonary: normal breath sounds, No wheezing.  CardiovascularNormal S1,S2.  No m/r/g.   Abdomen: Benign, Soft, non-tender. Renal:  No costovertebral tenderness  GU:  Not performed at this time. Endoc: No evident thyromegaly Skin:   warm, no rashes, no ecchymosis  Extremities: normal, no cyanosis, clubbing. PSYCHIATRIC: Mood, affect within normal limits.   ALL OTHER ROS ARE NEGATIVE      Assessment and Plan:  Severe emphysema [place] stage C  Patient continues to take nebulized medication due to the fact that she has very poor respiratory insufficiency she can no longer use traditional inhaler therapy  Continue Brovana and Pulmicort nebs  No signs of exacerbation at this time    Chronic shortness of breath and dyspnea exertion Related to multifactorial related problems with COPD and deconditioned state  Hypoxic respiratory failure from COPD She benefits and uses oxygen therapy She needs this for survival   COVID-19 EDUCATION: The signs and symptoms of COVID-19 were discussed with the patient and how to seek care for testing.  The importance of social distancing was discussed today. Hand Washing Techniques and avoid touching face was advised.     MEDICATION ADJUSTMENTS/LABS AND TESTS ORDERED: Continue nebulizers as prescribed Continue oxygen as prescribed Avoid sick contacts   [name] [name]   Patient satisfied with Plan of action and management. All questions answered  Follow up in 1 year  Total time spent 23  minutes   Corrin Parker, M.D.  [facility]  Medical Director [place] Director [facility]io-P[facility] Department

## 2020-02-20 NOTE — Patient Instructions (Addendum)
Continue nebulizers as prescribed Continue oxygen as prescribed Avoid sick contacts  PRED 20 mg daily for 10 days LEVAQUIN 500 mg daily for 7 days

## 2020-03-10 DIAGNOSIS — J309 Allergic rhinitis, unspecified: Secondary | ICD-10-CM | POA: Diagnosis not present

## 2020-03-10 DIAGNOSIS — R0902 Hypoxemia: Secondary | ICD-10-CM | POA: Diagnosis not present

## 2020-03-10 DIAGNOSIS — J439 Emphysema, unspecified: Secondary | ICD-10-CM | POA: Diagnosis not present

## 2020-03-10 DIAGNOSIS — J432 Centrilobular emphysema: Secondary | ICD-10-CM | POA: Diagnosis not present

## 2020-03-15 DIAGNOSIS — J309 Allergic rhinitis, unspecified: Secondary | ICD-10-CM | POA: Diagnosis not present

## 2020-03-15 DIAGNOSIS — R0902 Hypoxemia: Secondary | ICD-10-CM | POA: Diagnosis not present

## 2020-04-03 DIAGNOSIS — J432 Centrilobular emphysema: Secondary | ICD-10-CM | POA: Diagnosis not present

## 2020-04-03 DIAGNOSIS — J309 Allergic rhinitis, unspecified: Secondary | ICD-10-CM | POA: Diagnosis not present

## 2020-04-03 DIAGNOSIS — J439 Emphysema, unspecified: Secondary | ICD-10-CM | POA: Diagnosis not present

## 2020-04-03 DIAGNOSIS — R0902 Hypoxemia: Secondary | ICD-10-CM | POA: Diagnosis not present

## 2020-04-11 ENCOUNTER — Other Ambulatory Visit: Payer: Self-pay | Admitting: Family Medicine

## 2020-04-14 NOTE — Telephone Encounter (Signed)
E-scribed refills.  Plz schedule wellness, cpe and lab visits.  

## 2020-04-15 ENCOUNTER — Other Ambulatory Visit: Payer: Self-pay

## 2020-04-15 ENCOUNTER — Ambulatory Visit (INDEPENDENT_AMBULATORY_CARE_PROVIDER_SITE_OTHER): Payer: PPO | Admitting: Family Medicine

## 2020-04-15 VITALS — BP 120/70 | HR 52 | Temp 97.5°F | Wt 124.0 lb

## 2020-04-15 DIAGNOSIS — J9611 Chronic respiratory failure with hypoxia: Secondary | ICD-10-CM | POA: Diagnosis not present

## 2020-04-15 DIAGNOSIS — R0902 Hypoxemia: Secondary | ICD-10-CM | POA: Diagnosis not present

## 2020-04-15 DIAGNOSIS — J449 Chronic obstructive pulmonary disease, unspecified: Secondary | ICD-10-CM

## 2020-04-15 DIAGNOSIS — J441 Chronic obstructive pulmonary disease with (acute) exacerbation: Secondary | ICD-10-CM | POA: Diagnosis not present

## 2020-04-15 DIAGNOSIS — J309 Allergic rhinitis, unspecified: Secondary | ICD-10-CM | POA: Diagnosis not present

## 2020-04-15 MED ORDER — PREDNISONE 20 MG PO TABS: 20.0000 mg | ORAL_TABLET | Freq: Every day | ORAL | 0 refills | Status: DC

## 2020-04-15 MED ORDER — LEVOFLOXACIN 500 MG PO TABS: 500.0000 mg | ORAL_TABLET | Freq: Every day | ORAL | 0 refills | Status: AC

## 2020-04-15 NOTE — Patient Instructions (Signed)
COPD - nebulizer three times a day (can do 4)  - Steroids for 10 days - Antibiotics for 7 days

## 2020-04-15 NOTE — Assessment & Plan Note (Signed)
Stable on home O2, however, reports needing more of 4L. Suspect COPD exacerbation. Had similar symptoms which responded to Abx and prednisone in September. Will use same prescriptions from pulmonology. ER precautions discussed. Increased nebulizer treatment to TID from BID. And up to 4 times daily as needed.

## 2020-04-15 NOTE — Progress Notes (Signed)
Subjective:     [name] [name] is a 79 y.o. female presenting for Wheezing (x 1 week ), Fatigue (x 1 week ), Cough (dry x 1 week ), and Chest Pain (only with deep breaths )     HPI   #COPD - symptoms started 1 week ago - dry cough - non-productive - treatment: nebulizer twice daily - pulmicort, albuterol, and brovana - twice daily  - was on prednisone in September and given prednisone with significant improvement in her symtpoms  Using 3-4 L of oxygen and does better with 4L of oxygen but otherwise doing ok  No sick contact covid booster on Thursday Felt bad Friday     Review of Systems  Constitutional: Negative for chills and fever.  Respiratory: Positive for chest tightness, shortness of breath and wheezing.      Social History   Tobacco Use  Smoking Status Former Smoker   Packs/day: 3.00   Years: 34.00   Pack years: 102.00   Types: Cigarettes   Quit date: 05/29/1995   Years since quitting: 24.8  Smokeless Tobacco Never Used        Objective:    BP Readings from Last 3 Encounters:  04/15/20 120/70  02/20/20 102/68  02/07/20 130/82   Wt Readings from Last 3 Encounters:  04/15/20 124 lb (56.2 kg)  02/20/20 121 lb 12.8 oz (55.2 kg)  02/07/20 120 lb (54.4 kg)    BP 120/70    Pulse (!) 52    Temp (!) 97.5 F (36.4 C) (Temporal)    Wt 124 lb (56.2 kg)    SpO2 98%    BMI 21.28 kg/m    Physical Exam Constitutional:      General: She is not in acute distress.    Appearance: She is well-developed. She is not diaphoretic.  HENT:     Head: Normocephalic and atraumatic.     Right Ear: External ear normal.     Left Ear: External ear normal.     Nose: Nose normal.  Eyes:     Conjunctiva/sclera: Conjunctivae normal.  Cardiovascular:     Rate and Rhythm: Normal rate.     Heart sounds: Heart sounds are distant.  Pulmonary:     Effort: Pulmonary effort is normal.     Breath sounds: Decreased air movement present. Wheezing present.      Comments: Diffuse wheezing and poor airmovement Musculoskeletal:     Cervical back: Neck supple.  Skin:    General: Skin is warm and dry.     Capillary Refill: Capillary refill takes less than 2 seconds.  Neurological:     Mental Status: She is alert. Mental status is at baseline.  Psychiatric:        Mood and Affect: Mood normal.        Behavior: Behavior normal.           Assessment & Plan:   Problem List Items Addressed This Visit      Respiratory   Chronic respiratory failure with hypoxia (HCC)    Stable on home O2, however, reports needing more of 4L. Suspect COPD exacerbation. Had similar symptoms which responded to Abx and prednisone in September. Will use same prescriptions from pulmonology. ER precautions discussed. Increased nebulizer treatment to TID from BID. And up to 4 times daily as needed.        Other Visit Diagnoses    COPD exacerbation ([place])    -  Primary   Relevant Medications   predniSONE (  DELTASONE) 20 MG tablet   levofloxacin (LEVAQUIN) 500 MG tablet   Chronic obstructive pulmonary disease, unspecified COPD type (HCC)       Relevant Medications   predniSONE (DELTASONE) 20 MG tablet   levofloxacin (LEVAQUIN) 500 MG tablet       Return if symptoms worsen or fail to improve.  Lesleigh Noe, MD  This visit occurred during the SARS-CoV-2 public health emergency.  Safety protocols were in place, including screening questions prior to the visit, additional usage of staff PPE, and extensive cleaning of exam room while observing appropriate contact time as indicated for disinfecting solutions.

## 2020-04-29 DIAGNOSIS — J439 Emphysema, unspecified: Secondary | ICD-10-CM | POA: Diagnosis not present

## 2020-04-29 DIAGNOSIS — R0902 Hypoxemia: Secondary | ICD-10-CM | POA: Diagnosis not present

## 2020-04-29 DIAGNOSIS — J309 Allergic rhinitis, unspecified: Secondary | ICD-10-CM | POA: Diagnosis not present

## 2020-04-29 DIAGNOSIS — J432 Centrilobular emphysema: Secondary | ICD-10-CM | POA: Diagnosis not present

## 2020-05-02 ENCOUNTER — Encounter: Payer: Self-pay | Admitting: Family Medicine

## 2020-05-02 ENCOUNTER — Other Ambulatory Visit: Payer: Self-pay

## 2020-05-02 ENCOUNTER — Other Ambulatory Visit: Payer: Self-pay | Admitting: Family Medicine

## 2020-05-02 ENCOUNTER — Other Ambulatory Visit: Payer: PPO

## 2020-05-02 ENCOUNTER — Telehealth: Payer: Self-pay

## 2020-05-02 ENCOUNTER — Telehealth (INDEPENDENT_AMBULATORY_CARE_PROVIDER_SITE_OTHER): Payer: PPO | Admitting: Family Medicine

## 2020-05-02 DIAGNOSIS — J309 Allergic rhinitis, unspecified: Secondary | ICD-10-CM | POA: Diagnosis not present

## 2020-05-02 DIAGNOSIS — R197 Diarrhea, unspecified: Secondary | ICD-10-CM | POA: Diagnosis not present

## 2020-05-02 DIAGNOSIS — J439 Emphysema, unspecified: Secondary | ICD-10-CM | POA: Diagnosis not present

## 2020-05-02 DIAGNOSIS — J432 Centrilobular emphysema: Secondary | ICD-10-CM | POA: Diagnosis not present

## 2020-05-02 DIAGNOSIS — R0902 Hypoxemia: Secondary | ICD-10-CM | POA: Diagnosis not present

## 2020-05-02 NOTE — Progress Notes (Signed)
Virtual Visit via Video Note  I connected with [name] [name] on 05/02/20 at 11:00 AM EST by a video enabled telemedicine application and verified that I am speaking with the correct person using two identifiers.  Location: Patient: home Provider: office   I discussed the limitations of evaluation and management by telemedicine and the availability of in person appointments. The patient expressed understanding and agreed to proceed.  Parties involved in encounter  Patient:[name] [name]   Provider:  Loura Pardon MD    Video failed today (ipad did not work)   History of Present Illness: 79 yo pt of Dr Darnell Level presents with diarrhea and vomiting  She has a h/o copd and CAD  Had a bout (worse than usual)  Monday had vomiting and fever  Diarrhea started a little earlier (3 weeks on and off)   Diarrhea is watery and "slick"  Orange in color  No blood  Some mucous in stool  Some discomfort in lower abdomen   Some temp 100.6 yesterday   Is able to drink fluids-water and gatorade   No sick contacts that she knows of   Breathing is better  Not coughing  Is able to taste /smell    No longer nauseated or vomiting  No covid testing  [facility]   No energy  No appetite  Sleeping all the night   Thinks her covid booster made her feel bad    No recent medicine changes  Did takes prednisone and levofloxin (11/23) and she did have diarrhea when she was taking it    H/o ccy and total hyst in the past   Patient Active Problem List   Diagnosis Date Noted  . Diarrhea 05/02/2020  . Abdominal aortic atherosclerosis ([place]) 08/25/2019  . Right iliac artery stenosis ([place]) 08/25/2019  . GERD (gastroesophageal reflux disease) 04/03/2019  . Chronic diarrhea 03/05/2019  . PAD (peripheral artery disease) ([place]) 01/05/2019  . Epidermal cyst 02/01/2018  . Neck pain 02/01/2018  . Weight loss, unintentional 12/29/2017  . Hoarseness, persistent 12/29/2017  . Dysphagia  11/17/2017  . Orthostatic syncope 09/21/2016  . Trigeminal neuralgia of right side of face 11/18/2015  . CAD (coronary artery disease) 03/04/2015  . Health maintenance examination 03/04/2015  . MAI (mycobacterium avium-intracellulare) ([place]) 10/23/2014  . Hx of colonic polyps 04/23/2014  . Advanced care planning/counseling discussion 02/18/2014  . Allergic rhinitis 02/05/2014  . MRSA colonization 02/05/2014  . Urge incontinence of urine 03/23/2013  . Recurrent UTI 07/27/2012  . Chronic respiratory failure with hypoxia ([place]) 04/27/2012  . Medicare annual wellness visit, subsequent 09/10/2011  . Dyslipidemia 09/01/2011  . Hyperlipidemia 09/01/2011  . Osteopenia 08/23/2011  . Chronic fatigue 06/19/2008  . GOLD 4 COPD with emphysema 02/01/2008  . ANXIETY 11/21/2006  . MDD (major depressive disorder), recurrent episode, moderate ([place]) 11/21/2006  . Essential hypertension 11/21/2006  . Hyperglycemia 11/21/2006  . Depression 11/21/2006  . Generalized anxiety disorder 11/21/2006  . Major depressive disorder, single episode 11/21/2006   Past Medical History:  Diagnosis Date  . Chronic cystitis    recurrent UTI (4+ in last year), on suppressive doxy (uro [place])  . Colon polyp   . 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 Depression with anxiety   . Diverticular disease 10/09/09   CT abd no sigmoid mass DDD L5/S1  . Dyslipidemia   . GERD (gastroesophageal reflux disease)   . Hematuria    h/o kidney stones  . Hiatal hernia   . Hypertension   .  Left scapholunate ligament tear 2014   found on imaging  . Lung cancer ([place]) 1987   Partial RLL Lobectomy.  . Osteoarthritis   . Osteopenia 08/2011   DEXA: L femur -1.6  . Peptic ulcer   . Spinal abscess ([place]) 2000   secondary to periodontal disease   Past Surgical History:  Procedure Laterality Date  . BLADDER SURGERY    . BREAST BIOPSY Left 03/99   neg  . CHOLECYSTECTOMY  02/10/09  . COLONOSCOPY WITH PROPOFOL N/A 04/30/2014   Procedure:  COLONOSCOPY WITH PROPOFOL;  Surgeon: Irene Shipper, MD;  Location: [facility];  Service: Endoscopy;  Laterality: N/A;  . CYSTOSCOPY  02/11/07 and 09/25/08   Dr. Jacqlyn Larsen, normal  . DEXA  08/2011   T femur -1.6, T spine -0.6  . [place] pneumonia sepsis due to gum surgery  . OOPHORECTOMY    . REPLACEMENT TOTAL KNEE  09/2009   Right  . TOTAL ABDOMINAL HYSTERECTOMY  07/18/01   benign reason, BSO, vag repair secondary prolapse  . TUBAL LIGATION     bilateral   Social History   Tobacco Use  . Smoking status: Former Smoker    Packs/day: 3.00    Years: 34.00    Pack years: 102.00    Types: Cigarettes    Quit date: 05/29/1995    Years since quitting: 24.9  . Smokeless tobacco: Never Used  Vaping Use  . Vaping Use: Never used  Substance Use Topics  . Alcohol use: Yes    Alcohol/week: 0.0 standard drinks    Comment: wine occasionally  . Drug use: No   Family History  Problem Relation Age of Onset  . Pancreatitis Mother   . Heart disease Father   . Hypertension Father   . Emphysema Father   . Arthritis Brother   . Hypertension Brother   . Cancer Maternal Aunt        ovarian cancer  . Kidney disease Neg Hx   . Prostate cancer Neg Hx   . Bladder Cancer Neg Hx    Allergies  Allergen Reactions  . Keflex [Cephalexin] Shortness Of Breath  . Penicillins     REACTION: hives   Current Outpatient Medications on File Prior to Visit  Medication Sig Dispense Refill  . albuterol (PROVENTIL) (2.5 MG/3ML) 0.083% nebulizer solution Take every 4-6 hours as needed for shortness of breath 75 mL 5  . arformoterol (BROVANA) 15 MCG/2ML NEBU Take 2 mLs (15 mcg total) by nebulization 2 (two) times daily. 120 mL 11  . Biotin 10 MG CAPS Take 1 capsule by mouth daily.     . budesonide (PULMICORT) 0.5 MG/2ML nebulizer solution USE THE CONTENTS OF ONE VIAL (2ML) BY NEBULIZATION TWICE A DAY. 180 mL 1  . buPROPion (WELLBUTRIN SR) 150 MG 12 hr tablet TAKE 1 TABLET BY MOUTH TWICE  A DAY 180 tablet 1  . Calcium Carb-Cholecalciferol (CALCIUM 600+D3) 600-200 MG-UNIT TABS Take 1 tablet by mouth daily.    . cholecalciferol (VITAMIN D) 1000 units tablet Take 2,000 Units by mouth daily.     Marland Kitchen conjugated estrogens (PREMARIN) vaginal cream Place 1 Applicatorful vaginally daily. Apply 0.5mg  (pea-sized amount)  just inside the vaginal introitus with a finger-tip on  Monday, Wednesday and Friday nights. (Patient taking differently: Place 1 Applicatorful vaginally as needed. Apply 0.5mg  (pea-sized amount)  just inside the vaginal introitus with a finger-tip on  Monday, Wednesday and Friday nights.) 30 g 12  . CRANBERRY  PO Take 2 tablets by mouth every morning.     . ezetimibe (ZETIA) 10 MG tablet TAKE 1 TABLET BY MOUTH EVERY DAY 90 tablet 0  . losartan (COZAAR) 100 MG tablet Take 1 tablet (100 mg total) by mouth daily. 90 tablet 1  . metoprolol tartrate (LOPRESSOR) 25 MG tablet TAKE 1 TABLET BY MOUTH TWICE A DAY 180 tablet 3  . pravastatin (PRAVACHOL) 80 MG tablet TAKE 1 TABLET BY MOUTH EVERYDAY AT BEDTIME 90 tablet 3  . predniSONE (DELTASONE) 20 MG tablet Take 1 tablet (20 mg total) by mouth daily with breakfast. 10 days 10 tablet 0  . PROAIR HFA 108 (90 Base) MCG/ACT inhaler TAKE 2 PUFFS BY MOUTH EVERY 4 HOURS AS NEEDED 8.5 each 3  . psyllium (METAMUCIL) 58.6 % powder Take 1 packet by mouth at bedtime.     . sertraline (ZOLOFT) 100 MG tablet Take 1 tablet (100 mg total) by mouth daily. 90 tablet 1  . tiotropium (SPIRIVA) 18 MCG inhalation capsule INHALE THE CONTENTS OF 1 CAPSULE ONE DAILY *NOT FOR ORAL USE*. 90 capsule 2   No current facility-administered medications on file prior to visit.   Review of Systems  Constitutional: Negative for chills, fever and malaise/fatigue.  HENT: Negative for congestion, ear pain, sinus pain and sore throat.   Eyes: Negative for blurred vision, discharge and redness.  Respiratory: Negative for cough, shortness of breath and stridor.    Cardiovascular: Negative for chest pain, palpitations and leg swelling.  Gastrointestinal: Positive for diarrhea. Negative for abdominal pain, blood in stool, constipation, heartburn, melena, nausea and vomiting.  Musculoskeletal: Negative for myalgias.  Skin: Negative for rash.  Neurological: Negative for dizziness and headaches.      Observations/Objective: Pt sounds well, not distressed  Nl cognition /good historian  Nl mood  No cough or sob heard    Assessment and Plan: Problem List Items Addressed This Visit      Other   Diarrhea    Originally with vomiting (that resolved)  Symptoms did follow a course of abx and prednisone  Low grade temp  Adv testing for covid Also stool test for c diff -to tx if positive These tests were ordered to pick up later this afternoon Encouraged fluids and bland diet  Rev s/s of dehydration to watch for  If suddenly worse inst to call/go to ER Update if not starting to improve in a week or if worsening             Follow Up Instructions: The office will call you to come in this afternoon for a covid swab (isolate until you get a result) Also to get equip to collect stool for a c diff test (this is a colon infection that is sometimes caused by antibiotics)  Drink fluids Keep diet bland  If symptoms suddenly worsen in the meantime let us know/go to the ER if needed    I discussed the assessment and treatment plan with the patient. The patient was provided an opportunity to ask questions and all were answered. The patient agreed with the plan and demonstrated an understanding of the instructions.   The patient was advised to call back or seek an in-person evaluation if the symptoms worsen or if the condition fails to improve as anticipated.  I provided 18 minutes of non-face-to-face time during this encounter.   Loura Pardon, MD

## 2020-05-02 NOTE — Telephone Encounter (Signed)
Per appt notes pt already has video visit with Dr Glori Bickers today at 11 AM.

## 2020-05-02 NOTE — Telephone Encounter (Signed)
[facility] Night - Client TELEPHONE ADVICE RECORD [facility] Patient Name: [name] [name] Gender: Female DOB: 10-17-1940 Age: 79 Y 56 M 9 D Return Phone Number: [phone] (Primary) Address: City/State/Zip: [place] [address] Client [facility] Night - Client Client Site [place] Physician Ria Bush - MD Contact Type Call Who Is Calling Patient / Member / Family / Caregiver Call Type Triage / Clinical Relationship To Patient Self Return Phone Number [phone] (Primary) Chief Complaint Fever (non urgent symptom) (> THREE MONTHS) Reason for Call Symptomatic / Request for [place] states that she came home from work yesterday and began vomiting as well as diarrhea and a fever. Caller states that the vomiting and diarrhea have subsided however she still has a fever of 100.0 Translation No Nurse Assessment Nurse: Lyda Perone, RN, Janett Billow Date/Time (Eastern Time): 05/01/2020 7:11:35 PM Confirm and document reason for call. If symptomatic, describe symptoms. ---Caller states that she came home from work yesterday and began vomiting as well as diarrhea and a fever. Caller states vomiting has subsided but is still having diarrhea. Fever this afternoon was 100.6 (oral) Does the patient have any new or worsening symptoms? ---Yes Will a triage be completed? ---Yes Related visit to physician within the last 2 weeks? ---No Does the PT have any chronic conditions? (i.e. diabetes, asthma, this includes High risk factors for pregnancy, etc.) ---Yes List chronic conditions. ---COPD Is this a behavioral health or substance abuse call? ---No Guidelines Guideline Title Affirmed Question Affirmed Notes Nurse Date/Time (Eastern Time) Diarrhea Black or tarry bowel movements (Exception: chronic-unchanged blackgrey bowel movements AND is taking iron pills or  Pepto-Bismol) Joslyn, RN, Janett Billow 05/01/2020 7:13:36 PM Disp. Time [name] Time) Disposition Final User PLEASE NOTE: All timestamps contained within this report are represented as [place] Standard Time. CONFIDENTIALTY NOTICE: This fax transmission is intended only for the addressee. It contains information that is legally privileged, confidential or otherwise protected from use or disclosure. If you are not the intended recipient, you are strictly prohibited from reviewing, disclosing, copying using or disseminating any of this information or taking any action in reliance on or regarding this information. If you have received this fax in error, please notify us immediately by telephone so that we can arrange for its return to [place]. Phone: [phone], Toll-Free: [phone], Fax: [phone] Page: 2 of 2 Call Id: [id number] 05/01/2020 7:19:42 PM Go to ED Now Yes Lyda Perone, RN, Janet [name] Disagree/Comply Disagree Caller Understands Yes PreDisposition Call Doctor Care Advice Given Per Guideline GO TO ED NOW: * You need to be seen in the Emergency Department. CARE ADVICE given per Diarrhea (Adult) guideline. Referrals GO TO FACILITY REFUSED

## 2020-05-04 LAB — SARS-COV-2, NAA 2 DAY TAT

## 2020-05-04 LAB — NOVEL CORONAVIRUS, NAA: SARS-CoV-2, NAA: NOT DETECTED

## 2020-05-04 LAB — SPECIMEN STATUS REPORT

## 2020-05-04 NOTE — Assessment & Plan Note (Signed)
Originally with vomiting (that resolved)  Symptoms did follow a course of abx and prednisone  Low grade temp  Adv testing for covid Also stool test for c diff -to tx if positive These tests were ordered to pick up later this afternoon Encouraged fluids and bland diet  Rev s/s of dehydration to watch for  If suddenly worse inst to call/go to ER Update if not starting to improve in a week or if worsening

## 2020-05-04 NOTE — Patient Instructions (Signed)
The office will call you to come in this afternoon for a covid swab (isolate until you get a result) Also to get equip to collect stool for a c diff test (this is a colon infection that is sometimes caused by antibiotics)  Drink fluids Keep diet bland  If symptoms suddenly worsen in the meantime let us know/go to the ER if needed

## 2020-05-05 ENCOUNTER — Other Ambulatory Visit (INDEPENDENT_AMBULATORY_CARE_PROVIDER_SITE_OTHER): Payer: PPO

## 2020-05-05 DIAGNOSIS — R197 Diarrhea, unspecified: Secondary | ICD-10-CM

## 2020-05-06 LAB — C. DIFFICILE GDH AND TOXIN A/B
GDH ANTIGEN: NOT DETECTED
MICRO NUMBER:: 11310079
SPECIMEN QUALITY:: ADEQUATE
TOXIN A AND B: NOT DETECTED

## 2020-05-15 DIAGNOSIS — J309 Allergic rhinitis, unspecified: Secondary | ICD-10-CM | POA: Diagnosis not present

## 2020-05-15 DIAGNOSIS — R0902 Hypoxemia: Secondary | ICD-10-CM | POA: Diagnosis not present

## 2020-05-19 ENCOUNTER — Ambulatory Visit (INDEPENDENT_AMBULATORY_CARE_PROVIDER_SITE_OTHER): Payer: PPO | Admitting: Family Medicine

## 2020-05-19 ENCOUNTER — Encounter: Payer: Self-pay | Admitting: Family Medicine

## 2020-05-19 ENCOUNTER — Telehealth: Payer: PPO | Admitting: Family Medicine

## 2020-05-19 ENCOUNTER — Other Ambulatory Visit: Payer: Self-pay

## 2020-05-19 VITALS — BP 140/62 | HR 63 | Temp 97.8°F | Ht 64.0 in | Wt 120.4 lb

## 2020-05-19 DIAGNOSIS — R5382 Chronic fatigue, unspecified: Secondary | ICD-10-CM

## 2020-05-19 DIAGNOSIS — F331 Major depressive disorder, recurrent, moderate: Secondary | ICD-10-CM

## 2020-05-19 DIAGNOSIS — R197 Diarrhea, unspecified: Secondary | ICD-10-CM

## 2020-05-19 DIAGNOSIS — K219 Gastro-esophageal reflux disease without esophagitis: Secondary | ICD-10-CM

## 2020-05-19 DIAGNOSIS — J439 Emphysema, unspecified: Secondary | ICD-10-CM | POA: Diagnosis not present

## 2020-05-19 NOTE — Assessment & Plan Note (Signed)
Ongoing, diarrhea isn't helping.  Will work towards resolution of diarrhea and reassess fatigue/mood.

## 2020-05-19 NOTE — Progress Notes (Signed)
[name], female    DOB: 1940/08/13, 79 y.o.   MRN: [id number]  This visit was conducted in person.  BP 140/62 (BP Location: Left Arm, Patient Position: Sitting, Cuff Size: Normal)   Pulse 63   Temp 97.8 F (36.6 C) (Temporal)   Ht _0  (1.626 m)   Wt 120 lb 6 oz (54.6 kg)   SpO2 97% Comment: 4 L  BMI 20.66 kg/m    CC: diarrhea, fatigue Subjective:   HPI: [name] [name] is a 79 y.o. female presenting on 05/19/2020 for Diarrhea (C/o ongoing diarrhea.  Improved a little taking OTC Imodium.  Also, c/o fatigue and appetite.  Sxs started about 3 wks ago.  Pt accompanied by son, [name] [name]- temp 97.6.), Arm Pain (C/o right arm pain since 06/20/19 after getting flu shot.), and Fatigue   See prior note for details. Saw Dr Glori Bickers virtually on 05/02/2020.  At that time had 3 wk h/o intermittent diarrhea with isolated episode of vomiting.  She had recently completed levaquin and prednisone course.  Now 5+ wks of loose stools described as watery stools. 4+ stools/day. She feels she's staying well hydrated with pedialyte and water, perrier.  Stools about normal colored. No buoyant stools but more grease. Some mucous. No blood in stool. Has woken up with urge to stool. Has had several accidents despite wearing depends.   Initially had LLQ abd discomfort but not since. Has had some chills. No fevers.  No new foods, drinks.  Son and his wife have had GI illness as well.  Uses city water.   Previously was using pepto bismol without much benefit.  She started imodium over the weekend with better effect.   Stopped omeprazole 6 months ago.   Cdiff testing negative. COVID swab negative.   H/o cholecystectomy and hysterectomy previously.   Ongoing R arm pain she thinks since flu shot 02/2019. No bump/lump. Arm ache wakes her up with radiation down to the hand. It's actually getting better. Suggested warm compress.      Relevant past medical, surgical, family and social  history reviewed and updated as indicated. Interim medical history since our last visit reviewed. Allergies and medications reviewed and updated. Outpatient Medications Prior to Visit  Medication Sig Dispense Refill  . albuterol (PROVENTIL) (2.5 MG/3ML) 0.083% nebulizer solution Take every 4-6 hours as needed for shortness of breath 75 mL 5  . arformoterol (BROVANA) 15 MCG/2ML NEBU Take 2 mLs (15 mcg total) by nebulization 2 (two) times daily. 120 mL 11  . budesonide (PULMICORT) 0.5 MG/2ML nebulizer solution USE THE CONTENTS OF ONE VIAL (2ML) BY NEBULIZATION TWICE A DAY. 180 mL 1  . buPROPion (WELLBUTRIN SR) 150 MG 12 hr tablet TAKE 1 TABLET BY MOUTH TWICE A DAY 180 tablet 1  . cholecalciferol (VITAMIN D) 1000 units tablet Take 2,000 Units by mouth daily.     Marland Kitchen conjugated estrogens (PREMARIN) vaginal cream Place 1 Applicatorful vaginally daily. Apply 0.43m (pea-sized amount)  just inside the vaginal introitus with a finger-tip on  Monday, Wednesday and Friday nights. (Patient taking differently: Place 1 Applicatorful vaginally as needed. Apply 0.517m(pea-sized amount)  just inside the vaginal introitus with a finger-tip on  Monday, Wednesday and Friday nights.) 30 g 12  . CRANBERRY PO Take 2 tablets by mouth every morning.     . ezetimibe (ZETIA) 10 MG tablet TAKE 1 TABLET BY MOUTH EVERY DAY 90 tablet 0  . losartan (COZAAR) 100 MG tablet Take 1  tablet (100 mg total) by mouth daily. 90 tablet 1  . metoprolol tartrate (LOPRESSOR) 25 MG tablet TAKE 1 TABLET BY MOUTH TWICE A DAY 180 tablet 3  . pravastatin (PRAVACHOL) 80 MG tablet TAKE 1 TABLET BY MOUTH EVERYDAY AT BEDTIME 90 tablet 3  . PROAIR HFA 108 (90 Base) MCG/ACT inhaler TAKE 2 PUFFS BY MOUTH EVERY 4 HOURS AS NEEDED 8.5 each 3  . sertraline (ZOLOFT) 100 MG tablet Take 1 tablet (100 mg total) by mouth daily. 90 tablet 1  . tiotropium (SPIRIVA) 18 MCG inhalation capsule INHALE THE CONTENTS OF 1 CAPSULE ONE DAILY *NOT FOR ORAL USE*. 90 capsule 2  .  Biotin 10 MG CAPS Take 1 capsule by mouth daily.     . Calcium Carb-Cholecalciferol (CALCIUM 600+D3) 600-200 MG-UNIT TABS Take 1 tablet by mouth daily.    . predniSONE (DELTASONE) 20 MG tablet Take 1 tablet (20 mg total) by mouth daily with breakfast. 10 days 10 tablet 0  . psyllium (METAMUCIL) 58.6 % powder Take 1 packet by mouth at bedtime.      No facility-administered medications prior to visit.     Per HPI unless specifically indicated in ROS section below Review of Systems Objective:  BP 140/62 (BP Location: Left Arm, Patient Position: Sitting, Cuff Size: Normal)   Pulse 63   Temp 97.8 F (36.6 C) (Temporal)   Ht _0  (1.626 m)   Wt 120 lb 6 oz (54.6 kg)   SpO2 97% Comment: 4 L  BMI 20.66 kg/m   Wt Readings from Last 3 Encounters:  05/19/20 120 lb 6 oz (54.6 kg)  05/02/20 118 lb (53.5 kg)  04/15/20 124 lb (56.2 kg)      Physical Exam Vitals and nursing note reviewed.  Constitutional:      Appearance: Normal appearance. She is not ill-appearing.  Cardiovascular:     Rate and Rhythm: Normal rate and regular rhythm.     Pulses: Normal pulses.     Heart sounds: Normal heart sounds. No murmur heard.   Pulmonary:     Effort: Pulmonary effort is normal. No respiratory distress.     Breath sounds: Normal breath sounds. No wheezing, rhonchi or rales.  Abdominal:     General: Abdomen is flat. Bowel sounds are normal. There is no distension.     Palpations: Abdomen is soft. There is no mass.     Tenderness: There is abdominal tenderness (mild) in the left lower quadrant. There is no guarding or rebound. Negative signs include Murphy's sign.     Hernia: No hernia is present.  Musculoskeletal:     Right lower leg: No edema.     Left lower leg: No edema.  Neurological:     Mental Status: She is alert.  Psychiatric:        Mood and Affect: Mood normal.        Behavior: Behavior normal.       Results for orders placed or performed in visit on 05/05/20  C. difficile GDH  and Toxin A/B  Result Value Ref Range   MICRO NUMBER: [id number]    SPECIMEN QUALITY: Adequate    Source STOOL    STATUS: FINAL    GDH ANTIGEN Not Detected    TOXIN A AND B Not Detected    COMMENT      No toxigenic C. difficile detected For additional information, please refer to http://education.[facility].com/faq/FAQ136 (This link is being provided for informational/educational purposes only.)   Assessment & Plan:  This  visit occurred during the SARS-CoV-2 public health emergency.  Safety protocols were in place, including screening questions prior to the visit, additional usage of staff PPE, and extensive cleaning of exam room while observing appropriate contact time as indicated for disinfecting solutions.   Problem List Items Addressed This Visit    MDD (major depressive disorder), recurrent episode, moderate (HCC)    Ongoing, diarrhea isn't helping.  Will work towards resolution of diarrhea and reassess fatigue/mood.  She continues sertraline 194m daily with wellbutrin 1553mBID.       GOLD 4 COPD with emphysema    Ongoing supplemental O2 use at 4L by [place] today.       GERD (gastroesophageal reflux disease)    She remains off omeprazole for the past 6 months. Stable period regarding GERD symptoms.       Diarrhea - Primary    5+ wk h/o watery diarrhea, somewhat controlled with imodium.  Check GI pathogen panel (recent listeria recall and salmonella outbreak) as well as fecal fat to evaluate for malabsorption.  Check labwork (CBC, CMP, TSH, ESR). Consider anti-TTG IgA pending results (celiac disease).  Reviewed importance of good hydration status - she feels she's doing well with this.  Consider microscopic colitis as possible cause (in sertraline use) - diarrhea improved last year when we dropped sertraline dose from 15016mo 100m45mily.       Relevant Orders   Comprehensive metabolic panel   CBC with Differential/Platelet   TSH   Sedimentation rate   Fecal Fat,  Qualitative   Gastrointestinal Pathogen Panel PCR   Chronic fatigue    Ongoing, diarrhea isn't helping.  Will work towards resolution of diarrhea and reassess fatigue/mood.           No orders of the defined types were placed in this encounter.  Orders Placed This Encounter  Procedures  . Fecal Fat, Qualitative    Standing Status:   Future    Standing Expiration Date:   05/19/2021  . Comprehensive metabolic panel  . CBC with Differential/Platelet  . TSH  . Sedimentation rate  . Gastrointestinal Pathogen Panel PCR    Standing Status:   Future    Standing Expiration Date:   05/19/2021    Patient Instructions  Labs today. Pass by lab to pick up stool kit.  Continue pushing small sips of fluids throughout the day.   Follow up plan: Return if symptoms worsen or fail to improve.  JaviRia Bush

## 2020-05-19 NOTE — Assessment & Plan Note (Signed)
Ongoing supplemental O2 use at 4L by [place] today.

## 2020-05-19 NOTE — Patient Instructions (Addendum)
Labs today. Pass by lab to pick up stool kit.  Continue pushing small sips of fluids throughout the day.

## 2020-05-19 NOTE — Assessment & Plan Note (Signed)
She remains off omeprazole for the past 6 months. Stable period regarding GERD symptoms.

## 2020-05-19 NOTE — Assessment & Plan Note (Addendum)
5+ wk h/o watery diarrhea, somewhat controlled with imodium.  Check GI pathogen panel (recent listeria recall and salmonella outbreak) as well as fecal fat to evaluate for malabsorption.  Check labwork (CBC, CMP, TSH, ESR). Consider anti-TTG IgA pending results (celiac disease).  Reviewed importance of good hydration status - she feels she's doing well with this.  Consider microscopic colitis as possible cause (in sertraline use) - diarrhea improved last year when we dropped sertraline dose from 187m to 1097mdaily.

## 2020-05-19 NOTE — Assessment & Plan Note (Addendum)
Ongoing, diarrhea isn't helping.  Will work towards resolution of diarrhea and reassess fatigue/mood.  She continues sertraline 100mg  daily with wellbutrin 150mg  BID.

## 2020-05-20 ENCOUNTER — Other Ambulatory Visit (INDEPENDENT_AMBULATORY_CARE_PROVIDER_SITE_OTHER): Payer: PPO

## 2020-05-20 DIAGNOSIS — R197 Diarrhea, unspecified: Secondary | ICD-10-CM

## 2020-05-20 LAB — COMPREHENSIVE METABOLIC PANEL WITH GFR
ALT: 13 U/L (ref 0–35)
AST: 17 U/L (ref 0–37)
Albumin: 3.9 g/dL (ref 3.5–5.2)
Alkaline Phosphatase: 41 U/L (ref 39–117)
BUN: 11 mg/dL (ref 6–23)
CO2: 37 meq/L — ABNORMAL HIGH (ref 19–32)
Calcium: 8.9 mg/dL (ref 8.4–10.5)
Chloride: 101 meq/L (ref 96–112)
Creatinine, Ser: 1.03 mg/dL (ref 0.40–1.20)
GFR: 51.84 mL/min — ABNORMAL LOW (ref >60.00)
Glucose, Bld: 86 mg/dL (ref 70–99)
Potassium: 4.1 meq/L (ref 3.5–5.1)
Sodium: 141 meq/L (ref 135–145)
Total Bilirubin: 0.3 mg/dL (ref 0.2–1.2)
Total Protein: 6.3 g/dL (ref 6.0–8.3)

## 2020-05-20 LAB — CBC WITH DIFFERENTIAL/PLATELET
Basophils Absolute: 0 K/uL (ref 0.0–0.1)
Basophils Relative: 0.2 % (ref 0.0–3.0)
Eosinophils Absolute: 0.4 K/uL (ref 0.0–0.7)
Eosinophils Relative: 6.4 % — ABNORMAL HIGH (ref 0.0–5.0)
HCT: 35.1 % — ABNORMAL LOW (ref 36.0–46.0)
Hemoglobin: 11.5 g/dL — ABNORMAL LOW (ref 12.0–15.0)
Lymphocytes Relative: 26 % (ref 12.0–46.0)
Lymphs Abs: 1.5 K/uL (ref 0.7–4.0)
MCHC: 32.8 g/dL (ref 30.0–36.0)
MCV: 92.5 fl (ref 78.0–100.0)
Monocytes Absolute: 0.6 K/uL (ref 0.1–1.0)
Monocytes Relative: 11.1 % (ref 3.0–12.0)
Neutro Abs: 3.3 K/uL (ref 1.4–7.7)
Neutrophils Relative %: 56.3 % (ref 43.0–77.0)
Platelets: 189 K/uL (ref 150.0–400.0)
RBC: 3.79 Mil/uL — ABNORMAL LOW (ref 3.87–5.11)
RDW: 12.5 % (ref 11.5–15.5)
WBC: 5.8 K/uL (ref 4.0–10.5)

## 2020-05-20 LAB — SEDIMENTATION RATE: Sed Rate: 14 mm/hr (ref 0–30)

## 2020-05-20 LAB — TSH: TSH: 1.39 u[IU]/mL (ref 0.35–4.50)

## 2020-05-23 DIAGNOSIS — J309 Allergic rhinitis, unspecified: Secondary | ICD-10-CM | POA: Diagnosis not present

## 2020-05-23 DIAGNOSIS — J432 Centrilobular emphysema: Secondary | ICD-10-CM | POA: Diagnosis not present

## 2020-05-23 DIAGNOSIS — R0902 Hypoxemia: Secondary | ICD-10-CM | POA: Diagnosis not present

## 2020-05-23 DIAGNOSIS — J439 Emphysema, unspecified: Secondary | ICD-10-CM | POA: Diagnosis not present

## 2020-05-24 LAB — GASTROINTESTINAL PATHOGEN PANEL PCR
C. difficile Tox A/B, PCR: NOT DETECTED
Campylobacter, PCR: NOT DETECTED
Cryptosporidium, PCR: NOT DETECTED
E coli (ETEC) LT/ST PCR: NOT DETECTED
E coli (STEC) stx1/stx2, PCR: NOT DETECTED
E coli 0157, PCR: NOT DETECTED
Giardia lamblia, PCR: NOT DETECTED
Norovirus, PCR: NOT DETECTED
Rotavirus A, PCR: NOT DETECTED
Salmonella, PCR: NOT DETECTED
Shigella, PCR: NOT DETECTED

## 2020-05-24 LAB — FECAL FAT, QUALITATIVE: FECAL FAT, QUALITATIVE: NORMAL

## 2020-05-25 ENCOUNTER — Other Ambulatory Visit: Payer: Self-pay | Admitting: Family Medicine

## 2020-05-26 ENCOUNTER — Other Ambulatory Visit: Payer: Self-pay | Admitting: Family Medicine

## 2020-05-28 ENCOUNTER — Other Ambulatory Visit: Payer: Self-pay | Admitting: Family Medicine

## 2020-05-28 DIAGNOSIS — R197 Diarrhea, unspecified: Secondary | ICD-10-CM

## 2020-06-07 ENCOUNTER — Other Ambulatory Visit: Payer: Self-pay | Admitting: Family Medicine

## 2020-06-09 ENCOUNTER — Encounter: Payer: Self-pay | Admitting: Family Medicine

## 2020-06-10 ENCOUNTER — Ambulatory Visit (INDEPENDENT_AMBULATORY_CARE_PROVIDER_SITE_OTHER): Payer: PPO

## 2020-06-10 DIAGNOSIS — Z0000 Encounter for general adult medical examination without abnormal findings: Secondary | ICD-10-CM | POA: Diagnosis not present

## 2020-06-10 NOTE — Progress Notes (Signed)
PCP notes:  Health Maintenance: mammogram- due dexa- due  tdap- insurance  Abnormal Screenings: none   Patient concerns: none   Nurse concerns: none   Next PCP appt.: 06/17/2020 @ 2:30 pm

## 2020-06-10 NOTE — Patient Instructions (Signed)
[name] [name] , Thank you for taking time to come for your Medicare Wellness Visit. I appreciate your ongoing commitment to your health goals. Please review the following plan we discussed and let me know if I can assist you in the future.   Screening recommendations/referrals: Colonoscopy: no longer required  Mammogram: due, will discuss with provider  Bone Density: due, will discuss with provider  Recommended yearly ophthalmology/optometry visit for glaucoma screening and checkup Recommended yearly dental visit for hygiene and checkup  Vaccinations: Influenza vaccine: Up to date, completed 04/10/2020, due 12/2020 Pneumococcal vaccine: Completed series Tdap vaccine: decline-insurance Shingles vaccine:Completed 1 dose 04/03/2019, #2 dose due   Covid-19:Completed series  Advanced directives: copy in chart   Conditions/risks identified: hyperlipidemia  Next appointment: Follow up in one year for your annual wellness visit    Preventive Care 80 Years and Older, Female Preventive care refers to lifestyle choices and visits with your health care provider that can promote health and wellness. What does preventive care include?  A yearly physical exam. This is also called an annual well check.  Dental exams once or twice a year.  Routine eye exams. Ask your health care provider how often you should have your eyes checked.  Personal lifestyle choices, including:  Daily care of your teeth and gums.  Regular physical activity.  Eating a healthy diet.  Avoiding tobacco and drug use.  Limiting alcohol use.  Practicing safe sex.  Taking low-dose aspirin every day.  Taking vitamin and mineral supplements as recommended by your health care provider. What happens during an annual well check? The services and screenings done by your health care provider during your annual well check will depend on your age, overall health, lifestyle risk factors, and family history of  disease. Counseling  Your health care provider may ask you questions about your:  Alcohol use.  Tobacco use.  Drug use.  Emotional well-being.  Home and relationship well-being.  Sexual activity.  Eating habits.  History of falls.  Memory and ability to understand (cognition).  Work and work Statistician.  Reproductive health. Screening  You may have the following tests or measurements:  Height, weight, and BMI.  Blood pressure.  Lipid and cholesterol levels. These may be checked every 5 years, or more frequently if you are over 5 years old.  Skin check.  Lung cancer screening. You may have this screening every year starting at age 80 if you have a 30-pack-year history of smoking and currently smoke or have quit within the past 15 years.  Fecal occult blood test (FOBT) of the stool. You may have this test every year starting at age 80.  Flexible sigmoidoscopy or colonoscopy. You may have a sigmoidoscopy every 5 years or a colonoscopy every 10 years starting at age 80.  Hepatitis C blood test.  Hepatitis B blood test.  Sexually transmitted disease (STD) testing.  Diabetes screening. This is done by checking your blood sugar (glucose) after you have not eaten for a while (fasting). You may have this done every 1-3 years.  Bone density scan. This is done to screen for osteoporosis. You may have this done starting at age 80.  Mammogram. This may be done every 1-2 years. Talk to your health care provider about how often you should have regular mammograms. Talk with your health care provider about your test results, treatment options, and if necessary, the need for more tests. Vaccines  Your health care provider may recommend certain vaccines, such as:  Influenza vaccine.  This is recommended every year.  Tetanus, diphtheria, and acellular pertussis (Tdap, Td) vaccine. You may need a Td booster every 10 years.  Zoster vaccine. You may need this after age  80.  Pneumococcal 13-valent conjugate (PCV13) vaccine. One dose is recommended after age 80.  Pneumococcal polysaccharide (PPSV23) vaccine. One dose is recommended after age 80. Talk to your health care provider about which screenings and vaccines you need and how often you need them. This information is not intended to replace advice given to you by your health care provider. Make sure you discuss any questions you have with your health care provider. Document Released: 06/06/2015 Document Revised: 01/28/2016 Document Reviewed: 03/11/2015 Elsevier Interactive Patient Education  2017 [place] Prevention in the Home Falls can cause injuries. They can happen to people of all ages. There are many things you can do to make your home safe and to help prevent falls. What can I do on the outside of my home?  Regularly fix the edges of walkways and driveways and fix any cracks.  Remove anything that might make you trip as you walk through a door, such as a raised step or threshold.  Trim any bushes or trees on the path to your home.  Use bright outdoor lighting.  Clear any walking paths of anything that might make someone trip, such as rocks or tools.  Regularly check to see if handrails are loose or broken. Make sure that both sides of any steps have handrails.  Any raised decks and porches should have guardrails on the edges.  Have any leaves, snow, or ice cleared regularly.  Use sand or salt on walking paths during winter.  Clean up any spills in your garage right away. This includes oil or grease spills. What can I do in the bathroom?  Use night lights.  Install grab bars by the toilet and in the tub and shower. Do not use towel bars as grab bars.  Use non-skid mats or decals in the tub or shower.  If you need to sit down in the shower, use a plastic, non-slip stool.  Keep the floor dry. Clean up any water that spills on the floor as soon as it happens.  Remove  soap buildup in the tub or shower regularly.  Attach bath mats securely with double-sided non-slip rug tape.  Do not have throw rugs and other things on the floor that can make you trip. What can I do in the bedroom?  Use night lights.  Make sure that you have a light by your bed that is easy to reach.  Do not use any sheets or blankets that are too big for your bed. They should not hang down onto the floor.  Have a firm chair that has side arms. You can use this for support while you get dressed.  Do not have throw rugs and other things on the floor that can make you trip. What can I do in the kitchen?  Clean up any spills right away.  Avoid walking on wet floors.  Keep items that you use a lot in easy-to-reach places.  If you need to reach something above you, use a strong step stool that has a grab bar.  Keep electrical cords out of the way.  Do not use floor polish or wax that makes floors slippery. If you must use wax, use non-skid floor wax.  Do not have throw rugs and other things on the floor that can make  you trip. What can I do with my stairs?  Do not leave any items on the stairs.  Make sure that there are handrails on both sides of the stairs and use them. Fix handrails that are broken or loose. Make sure that handrails are as long as the stairways.  Check any carpeting to make sure that it is firmly attached to the stairs. Fix any carpet that is loose or worn.  Avoid having throw rugs at the top or bottom of the stairs. If you do have throw rugs, attach them to the floor with carpet tape.  Make sure that you have a light switch at the top of the stairs and the bottom of the stairs. If you do not have them, ask someone to add them for you. What else can I do to help prevent falls?  Wear shoes that:  Do not have high heels.  Have rubber bottoms.  Are comfortable and fit you well.  Are closed at the toe. Do not wear sandals.  If you use a  stepladder:  Make sure that it is fully opened. Do not climb a closed stepladder.  Make sure that both sides of the stepladder are locked into place.  Ask someone to hold it for you, if possible.  Clearly mark and make sure that you can see:  Any grab bars or handrails.  First and last steps.  Where the edge of each step is.  Use tools that help you move around (mobility aids) if they are needed. These include:  Canes.  Walkers.  Scooters.  Crutches.  Turn on the lights when you go into a dark area. Replace any light bulbs as soon as they burn out.  Set up your furniture so you have a clear path. Avoid moving your furniture around.  If any of your floors are uneven, fix them.  If there are any pets around you, be aware of where they are.  Review your medicines with your doctor. Some medicines can make you feel dizzy. This can increase your chance of falling. Ask your doctor what other things that you can do to help prevent falls. This information is not intended to replace advice given to you by your health care provider. Make sure you discuss any questions you have with your health care provider. Document Released: 03/06/2009 Document Revised: 10/16/2015 Document Reviewed: 06/14/2014 Elsevier Interactive Patient Education  2017 [employer].

## 2020-06-10 NOTE — Progress Notes (Signed)
Subjective:   [name] [name] is a 80 y.o. female who presents for Medicare Annual (Subsequent) preventive examination.  Review of Systems: N/A     I connected with the patient today by telephone and verified that I am speaking with the correct person using two identifiers. Location patient: home Location nurse: work Persons participating in the telephone visit: patient, nurse.   I discussed the limitations, risks, security and privacy concerns of performing an evaluation and management service by telephone and the availability of in person appointments. I also discussed with the patient that there may be a patient responsible charge related to this service. The patient expressed understanding and verbally consented to this telephonic visit.        Cardiac Risk Factors include: advanced age (>41men, >40 women);Other (see comment), Risk factor comments: hyperlipidemia     Objective:    Today's Vitals   There is no height or weight on file to calculate BMI.  Advanced Directives 06/10/2020 01/24/2020 03/27/2019 03/16/2018 08/12/2016 04/30/2014 04/24/2014  Does Patient Have a Medical Advance Directive? Yes Yes Yes Yes No No No  Type of Paramedic of [place];Living will [place];Living will [place];Living will [place];Living will - - -  Copy of [place] in Chart? Yes - validated most recent copy scanned in chart (See row information) No - copy requested No - copy requested No - copy requested - - -  Would patient like information on creating a medical advance directive? - - - - - - No - patient declined information    Current Medications (verified) Outpatient Encounter Medications as of 06/10/2020  Medication Sig  . albuterol (PROVENTIL) (2.5 MG/3ML) 0.083% nebulizer solution Take every 4-6 hours as needed for shortness of breath  . arformoterol (BROVANA) 15 MCG/2ML NEBU Take 2 mLs  (15 mcg total) by nebulization 2 (two) times daily.  . budesonide (PULMICORT) 0.5 MG/2ML nebulizer solution USE THE CONTENTS OF ONE VIAL (2ML) BY NEBULIZATION TWICE A DAY.  Marland Kitchen buPROPion (WELLBUTRIN SR) 150 MG 12 hr tablet TAKE 1 TABLET BY MOUTH TWICE A DAY  . cholecalciferol (VITAMIN D) 1000 units tablet Take 2,000 Units by mouth daily.   Marland Kitchen conjugated estrogens (PREMARIN) vaginal cream Place 1 Applicatorful vaginally daily. Apply 0.5mg  (pea-sized amount)  just inside the vaginal introitus with a finger-tip on  Monday, Wednesday and Friday nights. (Patient taking differently: Place 1 Applicatorful vaginally as needed. Apply 0.5mg  (pea-sized amount)  just inside the vaginal introitus with a finger-tip on  Monday, Wednesday and Friday nights.)  . CRANBERRY PO Take 2 tablets by mouth every morning.   . ezetimibe (ZETIA) 10 MG tablet TAKE 1 TABLET BY MOUTH EVERY DAY  . losartan (COZAAR) 100 MG tablet Take 1 tablet (100 mg total) by mouth daily.  . metoprolol tartrate (LOPRESSOR) 25 MG tablet TAKE 1 TABLET BY MOUTH TWICE A DAY  . pravastatin (PRAVACHOL) 80 MG tablet TAKE 1 TABLET BY MOUTH EVERYDAY AT BEDTIME  . PROAIR HFA 108 (90 Base) MCG/ACT inhaler TAKE 2 PUFFS BY MOUTH EVERY 4 HOURS AS NEEDED  . sertraline (ZOLOFT) 100 MG tablet Take 1 tablet (100 mg total) by mouth daily.  Marland Kitchen tiotropium (SPIRIVA) 18 MCG inhalation capsule INHALE THE CONTENTS OF 1 CAPSULE ONE DAILY *NOT FOR ORAL USE*.   No facility-administered encounter medications on file as of 06/10/2020.    Allergies (verified) Keflex [cephalexin] and Penicillins   History: Past Medical History:  Diagnosis  Date  . Chronic cystitis    recurrent UTI (4+ in last year), on suppressive doxy (uro [place]ope)  . Colon polyp   . 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 Depression with anxiety   . Diverticular disease 10/09/09   CT abd no sigmoid mass DDD L5/S1  . Dyslipidemia   . GERD (gastroesophageal reflux disease)   . Hematuria    h/o kidney stones  .  Hiatal hernia   . Hypertension   . Left scapholunate ligament tear 2014   found on imaging  . Lung cancer ([place]) 1987   Partial RLL Lobectomy.  . Osteoarthritis   . Osteopenia 08/2011   DEXA: L femur -1.6  . Peptic ulcer   . Spinal abscess ([place]) 2000   secondary to periodontal disease   Past Surgical History:  Procedure Laterality Date  . BLADDER SURGERY    . BREAST BIOPSY Left 03/99   neg  . CHOLECYSTECTOMY  02/10/09  . COLONOSCOPY WITH PROPOFOL N/A 04/30/2014   Procedure: COLONOSCOPY WITH PROPOFOL;  Surgeon: Irene Shipper, MD;  Location: [facility];  Service: Endoscopy;  Laterality: N/A;  . CYSTOSCOPY  02/11/07 and 09/25/08   Dr. Jacqlyn Larsen, normal  . CYSTOSCOPY  07/2019   [facility] Erlene Quan)  . DEXA  08/2011   T femur -1.6, T spine -0.6  . Bronson pneumonia sepsis due to gum surgery  . OOPHORECTOMY    . REPLACEMENT TOTAL KNEE  09/2009   Right  . TOTAL ABDOMINAL HYSTERECTOMY  07/18/01   benign reason, BSO, vag repair secondary prolapse  . TUBAL LIGATION     bilateral   Family History  Problem Relation Age of Onset  . Pancreatitis Mother   . Heart disease Father   . Hypertension Father   . Emphysema Father   . Arthritis Brother   . Hypertension Brother   . Cancer Maternal Aunt        ovarian cancer  . Kidney disease Neg Hx   . Prostate cancer Neg Hx   . Bladder Cancer Neg Hx    Social History   Socioeconomic History  . Marital status: Widowed    Spouse name: Not on file  . Number of children: 2  . Years of education: Not on file  . Highest education level: Not on file  Occupational History  . Occupation: Retired    Fish farm manager: RETIRED    Comment: prior Glass blower/designer at [employer]  . Smoking status: Former Smoker    Packs/day: 3.00    Years: 34.00    Pack years: 102.00    Types: Cigarettes    Quit date: 05/29/1995    Years since quitting: 25.0  . Smokeless tobacco: Never Used  Vaping Use  . Vaping Use: Never used   Substance and Sexual Activity  . Alcohol use: Yes    Alcohol/week: 0.0 standard drinks    Comment: wine occasionally  . Drug use: No  . Sexual activity: Not Currently  Other Topics Concern  . Not on file  Social History Narrative   Caffeine: 4 cups coffee   Lives in independent living at [facility] for the third time, 2nd husband died of heart attack. Two stepsons. 3rd husband died summer 2018   Mother of [name]   Activity: Walks for exercise   Diet: good water, fruits/vegetables daily      Advanced directed - would want HCPOA to be husband,  [place]. Does not have set up - requests handout.   Social Determinants of Health   Financial Resource Strain: Low Risk   . Difficulty of Paying Living Expenses: Not hard at all  Food Insecurity: No Food Insecurity  . Worried About Charity fundraiser in the Last Year: Never true  . Ran Out of Food in the Last Year: Never true  Transportation Needs: No Transportation Needs  . Lack of Transportation (Medical): No  . Lack of Transportation (Non-Medical): No  Physical Activity: Inactive  . Days of Exercise per Week: 0 days  . Minutes of Exercise per Session: 0 min  Stress: No Stress Concern Present  . Feeling of Stress : Not at all  Social Connections: Not on file    Tobacco Counseling Counseling given: Not Answered   Clinical Intake:  Pre-visit preparation completed: Yes  Pain : No/denies pain     Nutritional Risks: Nausea/ vomitting/ diarrhea (diarrhea) Diabetes: No  How often do you need to have someone help you when you read instructions, pamphlets, or other written materials from your doctor or pharmacy?: 1 - Never What is the last grade level you completed in school?: 2 years of college  Diabetic: No Nutrition Risk Assessment:  Has the patient had any N/V/D within the last 2 months?  Yes , diarrhea sometimes Does the patient have any non-healing wounds?  No  Has the patient had any unintentional  weight loss or weight gain?  No   Diabetes:  Is the patient diabetic?  No  If diabetic, was a CBG obtained today?  N/A Did the patient bring in their glucometer from home?  N/A How often do you monitor your CBG's? N/A.   Financial Strains and Diabetes Management:  Are you having any financial strains with the device, your supplies or your medication? N/A.  Does the patient want to be seen by Chronic Care Management for management of their diabetes?  N/A Would the patient like to be referred to a Nutritionist or for Diabetic Management?  N/A    Interpreter Needed?: No  Information entered by :: CJohnson, LPN   Activities of Daily Living In your present state of health, do you have any difficulty performing the following activities: 06/10/2020  Hearing? N  Vision? N  Difficulty concentrating or making decisions? Y  Comment has some memory issues  Walking or climbing stairs? N  Dressing or bathing? N  Doing errands, shopping? N  Preparing Food and eating ? N  Using the Toilet? N  In the past six months, have you accidently leaked urine? Y  Comment wears pads daily  Do you have problems with loss of bowel control? N  Managing your Medications? N  Managing your Finances? N  Housekeeping or managing your Housekeeping? N  Some recent data might be hidden    Patient Care Team: Ria Bush, MD as PCP - General (Family Medicine) Rockey Situ Kathlene November, MD as Consulting Physician (Cardiology)  Indicate any recent Medical Services you may have received from other than [facility] providers in the past year (date may be approximate).     Assessment:   This is a routine wellness examination for November.  Hearing/Vision screen  Hearing Screening   125Hz  250Hz  500Hz  1000Hz  2000Hz  3000Hz  4000Hz  6000Hz  8000Hz   Right ear:           Left ear:           Vision Screening Comments: Patient gets annual eye exams   Dietary issues and  exercise activities discussed: Current Exercise Habits:  The patient does not participate in regular exercise at present, Exercise limited by: None identified  Goals    . Increase physical activity     When weather permits, I will resume stretching and walking for 30 min 1-2 days per week.     . Patient Stated     03/27/2019, I will maintain and continue medications as prescribed.     . Patient Stated     06/10/2020, I will maintain and continue medications as prescribed.       Depression Screen PHQ 2/9 Scores 06/10/2020 03/27/2019 03/16/2018 08/12/2016 03/10/2016 12/16/2015 03/04/2015  PHQ - 2 Score 2 0 0 0 0 3 1  PHQ- 9 Score 3 0 5 - 2 6 -    Fall Risk Fall Risk  06/10/2020 04/16/2019 03/27/2019 03/16/2018 08/12/2016  Falls in the past year? 0 1 0 Yes No  Comment - Emmi Telephone Survey: data to providers prior to load - 2-3 falls due to loss of balance -  Number falls in past yr: 0 1 0 2 or more -  Comment - Emmi Telephone Survey Actual Response = 1 - - -  Injury with Fall? 0 1 0 No -  Risk Factor Category  - - - High Fall Risk -  Risk for fall due to : Medication side effect - Medication side effect History of fall(s);Impaired balance/gait -  Follow up Falls evaluation completed;Falls prevention discussed - Falls evaluation completed;Falls prevention discussed - -    FALL RISK PREVENTION PERTAINING TO THE HOME:  Any stairs in or around the home? Yes  If so, are there any without handrails? No  Home free of loose throw rugs in walkways, pet beds, electrical cords, etc? Yes  Adequate lighting in your home to reduce risk of falls? Yes   ASSISTIVE DEVICES UTILIZED TO PREVENT FALLS:  Life alert? No  Use of a cane, walker or w/c? Yes  Grab bars in the bathroom? No  Shower chair or bench in shower? No  Elevated toilet seat or a handicapped toilet? No   TIMED UP AND GO:  Was the test performed? N/A telephone visit .   Cognitive Function: MMSE - Mini Mental State Exam 06/10/2020 03/27/2019 03/16/2018 08/12/2016  Orientation to time 4 5 5  5   Orientation to Place 5 5 5 5   Registration 3 3 3 3   Attention/ Calculation 5 5 0 0  Recall 3 3 3 2   Recall-comments - - - pt was unable to recall 1 of 3 words  Language- name 2 objects - - 0 0  Language- repeat 1 1 1 1   Language- follow 3 step command - - 3 3  Language- read & follow direction - - 0 0  Write a sentence - - 0 0  Copy design - - 0 0  Total score - - 20 19  Mini Cog  Mini-Cog screen was completed. Maximum score is 22. A value of 0 denotes this part of the MMSE was not completed or the patient failed this part of the Mini-Cog screening.       Immunizations Immunization History  Administered Date(s) Administered  . Fluad Quad(high Dose 65+) 03/05/2019  . Influenza Split 03/27/2012  . Influenza Whole 02/21/2006, 02/21/2008, 02/22/2011  . Influenza,inj,Quad PF,6+ Mos 02/16/2013, 02/18/2014, 03/04/2015, 01/21/2016, 06/15/2016, 02/01/2018  . Influenza-Unspecified 04/10/2020  . Moderna Sars-Covid-2 Vaccination 07/10/2019, 08/07/2019, 04/10/2020  . Pneumococcal Conjugate-13 02/18/2014  . Pneumococcal Polysaccharide-23 02/10/2009  . Td 05/25/1998,  09/18/2008  . Zoster 11/13/2012  . Zoster Recombinat (Shingrix) 04/03/2019    TDAP status: Due, Education has been provided regarding the importance of this vaccine. Advised may receive this vaccine at local pharmacy or Health Dept. Aware to provide a copy of the vaccination record if obtained from local pharmacy or Health Dept. Verbalized acceptance and understanding.  Flu Vaccine status: Up to date  Pneumococcal vaccine status: Up to date  Covid-19 vaccine status: Completed vaccines  Qualifies for Shingles Vaccine? Yes   Zostavax completed Yes   Shingrix Completed?: No.    Education has been provided regarding the importance of this vaccine. Patient has been advised to call insurance company to determine out of pocket expense if they have not yet received this vaccine. Advised may also receive vaccine at local  pharmacy or Health Dept. Verbalized acceptance and understanding.  Screening Tests Health Maintenance  Topic Date Due  . Hepatitis C Screening  Never done  . MAMMOGRAM  04/12/2020  . TETANUS/TDAP  06/11/2023 (Originally 09/19/2018)  . INFLUENZA VACCINE  Completed  . DEXA SCAN  Completed  . COVID-19 Vaccine  Completed  . PNA vac Low Risk Adult  Completed    Health Maintenance  Health Maintenance Due  Topic Date Due  . Hepatitis C Screening  Never done  . MAMMOGRAM  04/12/2020    Colorectal cancer screening: No longer required.   Mammogram status: due, will discuss with provider at physical   Bone Density status: due, will discuss with provider at physical   Lung Cancer Screening: (Low Dose CT Chest recommended if Age 50-80 years, 30 pack-year currently smoking OR have quit w/in 15 years.) does not qualify.   Additional Screening:  Hepatitis C Screening: does qualify; Completed due  Vision Screening: Recommended annual ophthalmology exams for early detection of glaucoma and other disorders of the eye. Is the patient up to date with their annual eye exam?  Yes  Who is the provider or what is the name of the office in which the patient attends annual eye exams? Dr. Linton Flemings If pt is not established with a provider, would they like to be referred to a provider to establish care? No .   Dental Screening: Recommended annual dental exams for proper oral hygiene  Community Resource Referral / Chronic Care Management: CRR required this visit?  No   CCM required this visit?  No      Plan:     I have personally reviewed and noted the following in the patient's chart:   . Medical and social history . Use of alcohol, tobacco or illicit drugs  . Current medications and supplements . Functional ability and status . Nutritional status . Physical activity . Advanced directives . List of other physicians . Hospitalizations, surgeries, and ER visits in previous 12  months . Vitals . Screenings to include cognitive, depression, and falls . Referrals and appointments  In addition, I have reviewed and discussed with patient certain preventive protocols, quality metrics, and best practice recommendations. A written personalized care plan for preventive services as well as general preventive health recommendations were provided to patient.   Due to this being a telephonic visit, the after visit summary with patients personalized plan was offered to patient via office or my-chart. Patient preferred to pick up at office at next visit or via mychart.   Andrez Grime, LPN   [id number]/46/[id number]

## 2020-06-11 ENCOUNTER — Other Ambulatory Visit: Payer: PPO

## 2020-06-11 ENCOUNTER — Other Ambulatory Visit: Payer: Self-pay

## 2020-06-11 ENCOUNTER — Other Ambulatory Visit (INDEPENDENT_AMBULATORY_CARE_PROVIDER_SITE_OTHER): Payer: PPO

## 2020-06-11 ENCOUNTER — Other Ambulatory Visit: Payer: Self-pay | Admitting: Family Medicine

## 2020-06-11 DIAGNOSIS — R5382 Chronic fatigue, unspecified: Secondary | ICD-10-CM

## 2020-06-11 DIAGNOSIS — R739 Hyperglycemia, unspecified: Secondary | ICD-10-CM | POA: Diagnosis not present

## 2020-06-11 DIAGNOSIS — K529 Noninfective gastroenteritis and colitis, unspecified: Secondary | ICD-10-CM | POA: Diagnosis not present

## 2020-06-11 DIAGNOSIS — E785 Hyperlipidemia, unspecified: Secondary | ICD-10-CM | POA: Diagnosis not present

## 2020-06-11 LAB — COMPREHENSIVE METABOLIC PANEL
ALT: 15 U/L (ref 0–35)
AST: 22 U/L (ref 0–37)
Albumin: 4.2 g/dL (ref 3.5–5.2)
Alkaline Phosphatase: 42 U/L (ref 39–117)
BUN: 9 mg/dL (ref 6–23)
CO2: 40 mEq/L — ABNORMAL HIGH (ref 19–32)
Calcium: 9.4 mg/dL (ref 8.4–10.5)
Chloride: 98 mEq/L (ref 96–112)
Creatinine, Ser: 0.81 mg/dL (ref 0.40–1.20)
GFR: 69.14 mL/min (ref >60.00)
Glucose, Bld: 102 mg/dL — ABNORMAL HIGH (ref 70–99)
Potassium: 4.5 mEq/L (ref 3.5–5.1)
Sodium: 141 mEq/L (ref 135–145)
Total Bilirubin: 0.5 mg/dL (ref 0.2–1.2)
Total Protein: 6.5 g/dL (ref 6.0–8.3)

## 2020-06-11 LAB — LIPID PANEL
Cholesterol: 155 mg/dL (ref 0–200)
HDL: 67 mg/dL (ref >39.00)
LDL Cholesterol: 68 mg/dL (ref 0–99)
NonHDL: 88.46
Total CHOL/HDL Ratio: 2
Triglycerides: 102 mg/dL (ref 0.0–149.0)
VLDL: 20.4 mg/dL (ref 0.0–40.0)

## 2020-06-11 LAB — VITAMIN B12: Vitamin B-12: 928 pg/mL — ABNORMAL HIGH (ref 211–911)

## 2020-06-11 LAB — HEMOGLOBIN A1C: Hgb A1c MFr Bld: 5.2 % (ref 4.6–6.5)

## 2020-06-11 LAB — CBC WITH DIFFERENTIAL/PLATELET
Basophils Absolute: 0 10*3/uL (ref 0.0–0.1)
Basophils Relative: 0.5 % (ref 0.0–3.0)
Eosinophils Absolute: 0.3 10*3/uL (ref 0.0–0.7)
Eosinophils Relative: 3.5 % (ref 0.0–5.0)
HCT: 37.6 % (ref 36.0–46.0)
Hemoglobin: 12.5 g/dL (ref 12.0–15.0)
Lymphocytes Relative: 23.8 % (ref 12.0–46.0)
Lymphs Abs: 1.8 10*3/uL (ref 0.7–4.0)
MCHC: 33.2 g/dL (ref 30.0–36.0)
MCV: 92 fl (ref 78.0–100.0)
Monocytes Absolute: 0.6 10*3/uL (ref 0.1–1.0)
Monocytes Relative: 8 % (ref 3.0–12.0)
Neutro Abs: 4.9 10*3/uL (ref 1.4–7.7)
Neutrophils Relative %: 64.2 % (ref 43.0–77.0)
Platelets: 169 10*3/uL (ref 150.0–400.0)
RBC: 4.08 Mil/uL (ref 3.87–5.11)
RDW: 13.5 % (ref 11.5–15.5)
WBC: 7.6 10*3/uL (ref 4.0–10.5)

## 2020-06-11 LAB — VITAMIN D 25 HYDROXY (VIT D DEFICIENCY, FRACTURES): VITD: 36.23 ng/mL (ref 30.00–100.00)

## 2020-06-17 ENCOUNTER — Encounter: Payer: Self-pay | Admitting: Family Medicine

## 2020-06-17 ENCOUNTER — Ambulatory Visit (INDEPENDENT_AMBULATORY_CARE_PROVIDER_SITE_OTHER): Payer: PPO | Admitting: Family Medicine

## 2020-06-17 ENCOUNTER — Other Ambulatory Visit: Payer: Self-pay

## 2020-06-17 VITALS — BP 126/62 | HR 65 | Temp 97.8°F | Ht 62.5 in | Wt 116.3 lb

## 2020-06-17 DIAGNOSIS — F331 Major depressive disorder, recurrent, moderate: Secondary | ICD-10-CM

## 2020-06-17 DIAGNOSIS — J301 Allergic rhinitis due to pollen: Secondary | ICD-10-CM | POA: Diagnosis not present

## 2020-06-17 DIAGNOSIS — J439 Emphysema, unspecified: Secondary | ICD-10-CM

## 2020-06-17 DIAGNOSIS — R634 Abnormal weight loss: Secondary | ICD-10-CM | POA: Diagnosis not present

## 2020-06-17 DIAGNOSIS — Z7189 Other specified counseling: Secondary | ICD-10-CM

## 2020-06-17 DIAGNOSIS — J9611 Chronic respiratory failure with hypoxia: Secondary | ICD-10-CM | POA: Diagnosis not present

## 2020-06-17 DIAGNOSIS — I739 Peripheral vascular disease, unspecified: Secondary | ICD-10-CM | POA: Diagnosis not present

## 2020-06-17 DIAGNOSIS — Z0000 Encounter for general adult medical examination without abnormal findings: Secondary | ICD-10-CM | POA: Diagnosis not present

## 2020-06-17 DIAGNOSIS — E785 Hyperlipidemia, unspecified: Secondary | ICD-10-CM

## 2020-06-17 DIAGNOSIS — G8929 Other chronic pain: Secondary | ICD-10-CM

## 2020-06-17 DIAGNOSIS — M25511 Pain in right shoulder: Secondary | ICD-10-CM | POA: Diagnosis not present

## 2020-06-17 DIAGNOSIS — I771 Stricture of artery: Secondary | ICD-10-CM

## 2020-06-17 DIAGNOSIS — I700 Atherosclerosis of aorta: Secondary | ICD-10-CM

## 2020-06-17 DIAGNOSIS — M85852 Other specified disorders of bone density and structure, left thigh: Secondary | ICD-10-CM | POA: Diagnosis not present

## 2020-06-17 DIAGNOSIS — K529 Noninfective gastroenteritis and colitis, unspecified: Secondary | ICD-10-CM

## 2020-06-17 DIAGNOSIS — I10 Essential (primary) hypertension: Secondary | ICD-10-CM

## 2020-06-17 MED ORDER — PRESERVISION AREDS 2 PO CAPS: 2.0000 | ORAL_CAPSULE | Freq: Every day | ORAL | Status: DC

## 2020-06-17 MED ORDER — MIRTAZAPINE 15 MG PO TABS: 15.0000 mg | ORAL_TABLET | Freq: Every day | ORAL | Status: DC

## 2020-06-17 NOTE — Assessment & Plan Note (Signed)
?  microscopic colitis - will continue to drop sertraline dose to 50mg  daily.  Recent GI pathogen panel reassuring. Recent fecal fat test normal.  Pending GI eval for ongoing diarrhea.  She is staying well hydrated.

## 2020-06-17 NOTE — Assessment & Plan Note (Signed)
Continues supplemental O2 use along with brovana and pulmicort nebs, spiriva.  Appreciate pulm care.

## 2020-06-17 NOTE — Assessment & Plan Note (Signed)
Mild weight loss noted, poor appetite in the mornings. She has started drinking ensure regularly. Protein levels normal.

## 2020-06-17 NOTE — Assessment & Plan Note (Signed)
Reviewed latest dexa from 2018 - continue regular calcium and vitamin D intake.  Weight bearing limited by chronic dyspnea.

## 2020-06-17 NOTE — Assessment & Plan Note (Signed)
Anticipate R RTC tendonitis and/or partial tear  rec heating pad, voltaren topically, stretching exercises (provided).  Discussed possible steroid injection - pt declines at this time as feels it may be getting better.

## 2020-06-17 NOTE — Patient Instructions (Addendum)
Let's drop sertraline dose to 50mg  daily (1/2 tablet daily) while we await to see GI. Update me in 1 month how you're doing on lower sertraline dose. Ultimately we may want to transition off sertraline onto mirtazapine (remeron) which can help with appetite.  Continue wellbutrin 150mg  daily.  I've sent mirtazapine to the pharmacy but don't fill yet.  Call to schedule mammogram at [facility].  Double check with pharmacy about second shingrix dose.  Consider taking daily antihistamine for runny nose in the morning (allegra or claritin).  Return as needed or in 3 months   Health Maintenance After Age 65 After age 64, you are at a higher risk for certain long-term diseases and infections as well as injuries from falls. Falls are a major cause of broken bones and head injuries in people who are older than age 19. Getting regular preventive care can help to keep you healthy and well. Preventive care includes getting regular testing and making lifestyle changes as recommended by your health care provider. Talk with your health care provider about:  Which screenings and tests you should have. A screening is a test that checks for a disease when you have no symptoms.  A diet and exercise plan that is right for you. What should I know about screenings and tests to prevent falls? Screening and testing are the best ways to find a health problem early. Early diagnosis and treatment give you the best chance of managing medical conditions that are common after age 61. Certain conditions and lifestyle choices may make you more likely to have a fall. Your health care provider may recommend:  Regular vision checks. Poor vision and conditions such as cataracts can make you more likely to have a fall. If you wear glasses, make sure to get your prescription updated if your vision changes.  Medicine review. Work with your health care provider to regularly review all of the medicines you are taking,  including over-the-counter medicines. Ask your health care provider about any side effects that may make you more likely to have a fall. Tell your health care provider if any medicines that you take make you feel dizzy or sleepy.  Osteoporosis screening. Osteoporosis is a condition that causes the bones to get weaker. This can make the bones weak and cause them to break more easily.  Blood pressure screening. Blood pressure changes and medicines to control blood pressure can make you feel dizzy.  Strength and balance checks. Your health care provider may recommend certain tests to check your strength and balance while standing, walking, or changing positions.  Foot health exam. Foot pain and numbness, as well as not wearing proper footwear, can make you more likely to have a fall.  Depression screening. You may be more likely to have a fall if you have a fear of falling, feel emotionally low, or feel unable to do activities that you used to do.  Alcohol use screening. Using too much alcohol can affect your balance and may make you more likely to have a fall. What actions can I take to lower my risk of falls? General instructions  Talk with your health care provider about your risks for falling. Tell your health care provider if: ? You fall. Be sure to tell your health care provider about all falls, even ones that seem minor. ? You feel dizzy, sleepy, or off-balance.  Take over-the-counter and prescription medicines only as told by your health care provider. These include any supplements.  Eat  a healthy diet and maintain a healthy weight. A healthy diet includes low-fat dairy products, low-fat (lean) meats, and fiber from whole grains, beans, and lots of fruits and vegetables. Home safety  Remove any tripping hazards, such as rugs, cords, and clutter.  Install safety equipment such as grab bars in bathrooms and safety rails on stairs.  Keep rooms and walkways well-lit. Activity  Follow  a regular exercise program to stay fit. This will help you maintain your balance. Ask your health care provider what types of exercise are appropriate for you.  If you need a cane or walker, use it as recommended by your health care provider.  Wear supportive shoes that have nonskid soles.   Lifestyle  Do not drink alcohol if your health care provider tells you not to drink.  If you drink alcohol, limit how much you have: ? 0-1 drink a day for women. ? 0-2 drinks a day for men.  Be aware of how much alcohol is in your drink. In the [place] one drink equals one typical bottle of beer (12 oz), one-half glass of wine (5 oz), or one shot of hard liquor (1 oz).  Do not use any products that contain nicotine or tobacco, such as cigarettes and e-cigarettes. If you need help quitting, ask your health care provider. Summary  Having a healthy lifestyle and getting preventive care can help to protect your health and wellness after age 8.  Screening and testing are the best way to find a health problem early and help you avoid having a fall. Early diagnosis and treatment give you the best chance for managing medical conditions that are more common for people who are older than age 71.  Falls are a major cause of broken bones and head injuries in people who are older than age 9. Take precautions to prevent a fall at home.  Work with your health care provider to learn what changes you can make to improve your health and wellness and to prevent falls. This information is not intended to replace advice given to you by your health care provider. Make sure you discuss any questions you have with your health care provider. Document Revised: 08/31/2018 Document Reviewed: 03/23/2017 Elsevier Patient Education  2021 [employer].

## 2020-06-17 NOTE — Assessment & Plan Note (Signed)
Monitor for claudication symptoms.

## 2020-06-17 NOTE — Assessment & Plan Note (Signed)
Overall stable period on wellbutrin SR 150mg , sertraline 100mg .  Will continue sertraline taper in effort to improve diarrhea.  Discussed possible transition to remeron.

## 2020-06-17 NOTE — Assessment & Plan Note (Addendum)
Advanced directive: thinks she brought copy 04/2019 - HCPOAs are [name] [name] and [name] [name] her children. Does not want life prolonging measures if terminal or incurable condition.

## 2020-06-17 NOTE — Assessment & Plan Note (Signed)
Continue statin. 

## 2020-06-17 NOTE — Assessment & Plan Note (Signed)
Chronic ,stable on losartan and metoprolol.

## 2020-06-17 NOTE — Assessment & Plan Note (Signed)
Discussed this. rec restart antihistamine like claritin or allegra.

## 2020-06-17 NOTE — Assessment & Plan Note (Signed)
Chronic, stable on zetia and pravastatin The 10-year ASCVD risk score Mikey Bussing DC Jr., et al., 2013) is: 52.1%   Values used to calculate the score:     Age: 80 years     Sex: Female     Is Non-Hispanic African American: No     Diabetic: Yes     Tobacco smoker: No     Systolic Blood Pressure: 794 mmHg     Is BP treated: Yes     HDL Cholesterol: 67 mg/dL     Total Cholesterol: 155 mg/dL

## 2020-06-17 NOTE — Progress Notes (Signed)
[name], female    DOB: November 24, 1940, 80 y.o.   MRN: [id number]  This visit was conducted in person.  BP 126/62 (BP Location: Left Arm, Patient Position: Sitting, Cuff Size: Normal)   Pulse 65   Temp 97.8 F (36.6 C) (Temporal)   Ht 5' 2.5" (1.588 m)   Wt 116 lb 5 oz (52.8 kg)   SpO2 96%   BMI 20.93 kg/m    CC: CPE Subjective:   HPI: [name] [name] is a 80 y.o. female presenting on 06/17/2020 for Annual Exam (Prt 2.  Pt accompanied by daughter, [name]- temp 97.3.)Prt 2.  Pt accompanied by daughter, [name]- temp 97.3.)   Saw health advisor last week for medicare wellness visit. Note reviewed.    No exam data present  Flowsheet Row Clinical Support from 06/10/2020 in [place] [facility]  PHQ-2 Total Score 2      Fall Risk  06/10/2020 04/16/2019 03/27/2019 03/16/2018 08/12/2016  Falls in the past year? 0 1 0 Yes No  Comment - Emmi Telephone Survey: data to providers prior to load - 2-3 falls due to loss of balance -  Number falls in past yr: 0 1 0 2 or more -  Comment - Emmi Telephone Survey Actual Response = 1 - - -  Injury with Fall? 0 1 0 No -  Risk Factor Category  - - - High Fall Risk -  Risk for fall due to : Medication side effect - Medication side effect History of fall(s);Impaired balance/gait -  Follow up Falls evaluation completed;Falls prevention discussed - Falls evaluation completed;Falls prevention discussed - -   Chronic diarrhea since mid 03/2020 - GI pathogen panel returned normal as did qualitative fecal fat. Off omeprazole since 10/2019. Ongoing loose and watery stools 3-5 times a day.   Weight loss - no appetite in the mornings or for lunch with some nausea. She has been drinking 1 ensure daily and enjoys this.   Mood - continues wellbutrin SR 150mg  BID and sertraline 100mg  daily. ?microscopic colitis - diarrhea back in 2020 did improve once sertraline dose dropped from 150 to 100mg  daily. Staying fatigued.   Chronic COPD - stable from respiratory standpoint - feels daily  nebulizer helps her.   Ongoing R shoulder pain - since 03/2020. May have started after vaccine. Worse pain with lifting arm over head. Trouble pulling pants on. Tried heating pad and topical muscle rub.   Preventative: COLONOSCOPY WITH PROPOFOL Date: 04/30/2014 Irene Shipper - severe diverticulosis with sigmoid stenosis, no polyps, f/u PRN Well woman - has stopped.  Mammogram 03/2019 [facility]1 @ [place]  DEXAosteopenia -Date:5/20218DEXA T -0.6 spine, -1.7 hipstable, drinks 1 glass of milk daily. She continues calcium tablet daily and vitamin D daily and weight bearing exercise as tolerated.  Flu shotyearly  COVID vaccine Moderna 06/2019, 07/2019, 03/2020 Td 2010  Pneumonia 2010 , prevnar 2015 Shingles - 10/2012 shingrix - first dose 03/2019 Advanced directive: thinks she brought copy 04/2019 - HCPOAs are [name] and Aaron Edelman her children. Does not want life prolonging measures if terminal or incurable condition.  Seat belt use discussed Sunscreen use discussed. No changing moles on skin. Ex smoker - quit 1997  Alcohol - 1 glass of wine occasionally Dentist yearly - due Eye exam yearly - yearly ([facility]) - beginnings of macular degeneration.  Bowel - no constipation  Bladder - no recent UTIs - this has improved since taking vinegar tablets. Wears pad regularly for ongoing urinary incontinence    Caffeine: 4 cups coffee  Remarried for the third time, 2nd husband died of heart attack. Two stepsons  Activity: Walkssomefor exercise- 15 min/day Diet: good water, fruits/vegetables daily     Relevant past medical, surgical, family and social history reviewed and updated as indicated. Interim medical history since our last visit reviewed. Allergies and medications reviewed and updated. Outpatient Medications Prior to Visit  Medication Sig Dispense Refill  . albuterol (PROVENTIL) (2.5 MG/3ML) 0.083% nebulizer solution Take every 4-6 hours as needed for shortness of breath 75 mL 5  .  arformoterol (BROVANA) 15 MCG/2ML NEBU Take 2 mLs (15 mcg total) by nebulization 2 (two) times daily. 120 mL 11  . budesonide (PULMICORT) 0.5 MG/2ML nebulizer solution USE THE CONTENTS OF ONE VIAL (2ML) BY NEBULIZATION TWICE A DAY. 180 mL 1  . buPROPion (WELLBUTRIN SR) 150 MG 12 hr tablet TAKE 1 TABLET BY MOUTH TWICE A DAY 180 tablet 1  . conjugated estrogens (PREMARIN) vaginal cream Place 1 Applicatorful vaginally daily. Apply 0.5mg  (pea-sized amount)  just inside the vaginal introitus with a finger-tip on  Monday, Wednesday and Friday nights. (Patient taking differently: Place 1 Applicatorful vaginally as needed. Apply 0.5mg  (pea-sized amount)  just inside the vaginal introitus with a finger-tip on  Monday, Wednesday and Friday nights.) 30 g 12  . CRANBERRY PO Take 2 tablets by mouth every morning.     . ezetimibe (ZETIA) 10 MG tablet TAKE 1 TABLET BY MOUTH EVERY DAY 90 tablet 0  . losartan (COZAAR) 100 MG tablet Take 1 tablet (100 mg total) by mouth daily. 90 tablet 1  . metoprolol tartrate (LOPRESSOR) 25 MG tablet TAKE 1 TABLET BY MOUTH TWICE A DAY 180 tablet 3  . pravastatin (PRAVACHOL) 80 MG tablet TAKE 1 TABLET BY MOUTH EVERYDAY AT BEDTIME 90 tablet 3  . PROAIR HFA 108 (90 Base) MCG/ACT inhaler TAKE 2 PUFFS BY MOUTH EVERY 4 HOURS AS NEEDED 8.5 each 3  . tiotropium (SPIRIVA) 18 MCG inhalation capsule INHALE THE CONTENTS OF 1 CAPSULE ONE DAILY *NOT FOR ORAL USE*. 90 capsule 2  . sertraline (ZOLOFT) 100 MG tablet Take 1 tablet (100 mg total) by mouth daily. 90 tablet 1  . cholecalciferol (VITAMIN D) 1000 units tablet Take 2,000 Units by mouth daily.  (Patient not taking: Reported on 06/17/2020)    . sertraline (ZOLOFT) 100 MG tablet Take 0.5 tablets (50 mg total) by mouth daily.     No facility-administered medications prior to visit.     Per HPI unless specifically indicated in ROS section below Review of Systems  Constitutional: Positive for appetite change. Negative for activity change,  chills, fatigue, fever and unexpected weight change.  HENT: Positive for rhinorrhea (clear rhinorrhea in the morning - bilaterally). Negative for hearing loss.   Eyes: Negative for visual disturbance.  Respiratory: Positive for shortness of breath and wheezing (known COPD). Negative for cough and chest tightness.   Cardiovascular: Negative for chest pain, palpitations and leg swelling.  Gastrointestinal: Positive for diarrhea. Negative for abdominal distention, abdominal pain, blood in stool, constipation, nausea and vomiting.  Genitourinary: Negative for difficulty urinating and hematuria.  Musculoskeletal: Negative for arthralgias, myalgias and neck pain.  Skin: Negative for rash.  Neurological: Negative for dizziness, seizures, syncope and headaches.  Hematological: Negative for adenopathy. Does not bruise/bleed easily.  Psychiatric/Behavioral: Negative for dysphoric mood. The patient is not nervous/anxious.    Objective:  BP 126/62 (BP Location: Left Arm, Patient Position: Sitting, Cuff Size: Normal)   Pulse 65   Temp 97.8 F (36.6 C) (  Temporal)   Ht 5' 2.5" (1.588 m)   Wt 116 lb 5 oz (52.8 kg)   SpO2 96%   BMI 20.93 kg/m   Wt Readings from Last 3 Encounters:  06/17/20 116 lb 5 oz (52.8 kg)  05/19/20 120 lb 6 oz (54.6 kg)  05/02/20 118 lb (53.5 kg)      Physical Exam Vitals and nursing note reviewed.  Constitutional:      General: She is not in acute distress.    Appearance: Normal appearance. She is well-developed, underweight and well-nourished. She is not ill-appearing.  HENT:     Head: Normocephalic and atraumatic.     Right Ear: Hearing, tympanic membrane, ear canal and external ear normal.     Left Ear: Hearing, tympanic membrane, ear canal and external ear normal.     Mouth/Throat:     Mouth: Oropharynx is clear and moist and mucous membranes are normal.     Pharynx: No posterior oropharyngeal edema.  Eyes:     General: No scleral icterus.    Extraocular  Movements: Extraocular movements intact and EOM normal.     Conjunctiva/sclera: Conjunctivae normal.     Pupils: Pupils are equal, round, and reactive to light.  Neck:     Thyroid: No thyroid mass or thyromegaly.     Vascular: No carotid bruit.  Cardiovascular:     Rate and Rhythm: Normal rate and regular rhythm.     Pulses: Normal pulses and intact distal pulses.          Radial pulses are 2+ on the right side and 2+ on the left side.     Heart sounds: Normal heart sounds. No murmur heard.   Pulmonary:     Effort: Pulmonary effort is normal. No respiratory distress.     Breath sounds: Normal breath sounds. No wheezing, rhonchi or rales.  Abdominal:     General: Abdomen is flat. Bowel sounds are normal. There is no distension.     Palpations: Abdomen is soft. There is no mass.     Tenderness: There is no abdominal tenderness. There is no guarding or rebound.     Hernia: No hernia is present.  Musculoskeletal:        General: No edema. Normal range of motion.     Cervical back: Normal range of motion and neck supple.     Right lower leg: No edema.     Left lower leg: No edema.     Comments:  L shoulder WNL R shoulder exam: No deformity of shoulders on inspection. No significant pain with palpation of shoulder landmarks. FROM in abduction and forward flexion, pain with active ROM, improved with passive ROM. Discomfort with testing SITS in ext/int rotation. Pain with empty can sign. Pain with Speed test. No impingement. No pain with rotation of humeral head in [facility] joint.   Lymphadenopathy:     Cervical: No cervical adenopathy.  Skin:    General: Skin is warm and dry.     Findings: No rash.  Neurological:     General: No focal deficit present.     Mental Status: She is alert and oriented to person, place, and time.     Comments: CN grossly intact, station and gait intact  Psychiatric:        Mood and Affect: Mood and affect and mood normal.        Behavior: Behavior normal.         Thought Content: Thought content normal.  Judgment: Judgment normal.       Results for orders placed or performed in visit on 06/11/20  CBC with Differential/Platelet  Result Value Ref Range   WBC 7.6 4.0 - 10.5 K/uL   RBC 4.08 3.87 - 5.11 Mil/uL   Hemoglobin 12.5 12.0 - 15.0 g/dL   HCT 37.6 36.0 - 46.0 %   MCV 92.0 78.0 - 100.0 fl   MCHC 33.2 30.0 - 36.0 g/dL   RDW 13.5 11.5 - 15.5 %   Platelets 169.0 150.0 - 400.0 K/uL   Neutrophils Relative % 64.2 43.0 - 77.0 %   Lymphocytes Relative 23.8 12.0 - 46.0 %   Monocytes Relative 8.0 3.0 - 12.0 %   Eosinophils Relative 3.5 0.0 - 5.0 %   Basophils Relative 0.5 0.0 - 3.0 %   Neutro Abs 4.9 1.4 - 7.7 K/uL   Lymphs Abs 1.8 0.7 - 4.0 K/uL   Monocytes Absolute 0.6 0.1 - 1.0 K/uL   Eosinophils Absolute 0.3 0.0 - 0.7 K/uL   Basophils Absolute 0.0 0.0 - 0.1 K/uL  VITAMIN D 25 Hydroxy (Vit-D Deficiency, Fractures)  Result Value Ref Range   VITD 36.23 30.00 - 100.00 ng/mL  Vitamin B12  Result Value Ref Range   Vitamin B-12 928 (H) 211 - 911 pg/mL  Hemoglobin A1c  Result Value Ref Range   Hgb A1c MFr Bld 5.2 4.6 - 6.5 %  Comprehensive metabolic panel  Result Value Ref Range   Sodium 141 135 - 145 mEq/L   Potassium 4.5 3.5 - 5.1 mEq/L   Chloride 98 96 - 112 mEq/L   CO2 40 (H) 19 - 32 mEq/L   Glucose, Bld 102 (H) 70 - 99 mg/dL   BUN 9 6 - 23 mg/dL   Creatinine, Ser 0.81 0.40 - 1.20 mg/dL   Total Bilirubin 0.5 0.2 - 1.2 mg/dL   Alkaline Phosphatase 42 39 - 117 U/L   AST 22 0 - 37 U/L   ALT 15 0 - 35 U/L   Total Protein 6.5 6.0 - 8.3 g/dL   Albumin 4.2 3.5 - 5.2 g/dL   GFR 69.14 >60.00 mL/min   Calcium 9.4 8.4 - 10.5 mg/dL  Lipid panel  Result Value Ref Range   Cholesterol 155 0 - 200 mg/dL   Triglycerides 102.0 0.0 - 149.0 mg/dL   HDL 67.00 >39.00 mg/dL   VLDL 20.4 0.0 - 40.0 mg/dL   LDL Cholesterol 68 0 - 99 mg/dL   Total CHOL/HDL Ratio 2    NonHDL 88.46    Assessment & Plan:  This visit occurred during the  SARS-CoV-2 public health emergency.  Safety protocols were in place, including screening questions prior to the visit, additional usage of staff PPE, and extensive cleaning of exam room while observing appropriate contact time as indicated for disinfecting solutions.   Problem List Items Addressed This Visit    Weight loss, unintentional    Mild weight loss noted, poor appetite in the mornings. She has started drinking ensure regularly. Protein levels normal.       Right shoulder pain    Anticipate R RTC tendonitis and/or partial tear  rec heating pad, voltaren topically, stretching exercises (provided).  Discussed possible steroid injection - pt declines at this time as feels it may be getting better.       Right iliac artery stenosis (HCC)    Continue statin.       PAD (peripheral artery disease) (HCC)    Monitor for claudication symptoms.  Osteopenia    Reviewed latest dexa from 2018 - continue regular calcium and vitamin D intake.  Weight bearing limited by chronic dyspnea.      MDD (major depressive disorder), recurrent episode, moderate (HCC)    Overall stable period on wellbutrin SR 150mg , sertraline 100mg .  Will continue sertraline taper in effort to improve diarrhea.  Discussed possible transition to remeron.       Relevant Medications   sertraline (ZOLOFT) 100 MG tablet   Health maintenance examination - Primary    Preventative protocols reviewed and updated unless pt declined. Discussed healthy diet and lifestyle.       GOLD 4 COPD with emphysema    Continues supplemental O2 use along with brovana and pulmicort nebs, spiriva.  Appreciate pulm care.       Essential hypertension    Chronic ,stable on losartan and metoprolol.       Dyslipidemia    Chronic, stable on zetia and pravastatin The 10-year ASCVD risk score Mikey Bussing DC Jr., et al., 2013) is: 52.1%   Values used to calculate the score:     Age: 41 years     Sex: Female     Is Non-Hispanic African  American: No     Diabetic: Yes     Tobacco smoker: No     Systolic Blood Pressure: 101 mmHg     Is BP treated: Yes     HDL Cholesterol: 67 mg/dL     Total Cholesterol: 155 mg/dL       Chronic respiratory failure with hypoxia (HCC)   Chronic diarrhea    ?microscopic colitis - will continue to drop sertraline dose to 50mg  daily.  Recent GI pathogen panel reassuring. Recent fecal fat test normal.  Pending GI eval for ongoing diarrhea.  She is staying well hydrated.      Allergic rhinitis    Discussed this. rec restart antihistamine like claritin or allegra.      Advanced care planning/counseling discussion    Advanced directive: thinks she brought copy 04/2019 - HCPOAs are [name] and Aaron Edelman her children. Does not want life prolonging measures if terminal or incurable condition.       Abdominal aortic atherosclerosis (HCC)    Continue statin.           Meds ordered this encounter  Medications  . Multiple Vitamins-Minerals (PRESERVISION AREDS 2) CAPS    Sig: Take 2 capsules by mouth daily.  Marland Kitchen DISCONTD: mirtazapine (REMERON) 15 MG tablet    Sig: Take 1 tablet (15 mg total) by mouth at bedtime.    Dispense:  30 tablet    Refill:  03   No orders of the defined types were placed in this encounter.   Patient instructions: Let's drop sertraline dose to 50mg  daily (1/2 tablet daily) while we await to see GI. Update me in 1 month how you're doing on lower sertraline dose. Ultimately we may want to transition off sertraline onto mirtazapine (remeron) which can help with appetite.  Continue wellbutrin 150mg  daily.  I've sent mirtazapine to the pharmacy but don't fill yet.  Call to schedule mammogram at [facility].  Double check with pharmacy about second shingrix dose.  Consider taking daily antihistamine for runny nose in the morning (allegra or claritin).  Return as needed or in 3 months   Follow up plan: Return in about 6 months (around 12/15/2020), or if symptoms  worsen or fail to improve.  Ria Bush, MD

## 2020-06-17 NOTE — Assessment & Plan Note (Signed)
Preventative protocols reviewed and updated unless pt declined. Discussed healthy diet and lifestyle.  

## 2020-06-23 DIAGNOSIS — R0902 Hypoxemia: Secondary | ICD-10-CM | POA: Diagnosis not present

## 2020-06-23 DIAGNOSIS — J309 Allergic rhinitis, unspecified: Secondary | ICD-10-CM | POA: Diagnosis not present

## 2020-06-23 DIAGNOSIS — J432 Centrilobular emphysema: Secondary | ICD-10-CM | POA: Diagnosis not present

## 2020-06-23 DIAGNOSIS — J439 Emphysema, unspecified: Secondary | ICD-10-CM | POA: Diagnosis not present

## 2020-07-09 ENCOUNTER — Other Ambulatory Visit: Payer: Self-pay

## 2020-07-09 ENCOUNTER — Encounter: Payer: Self-pay | Admitting: Gastroenterology

## 2020-07-09 ENCOUNTER — Ambulatory Visit: Payer: PPO | Admitting: Gastroenterology

## 2020-07-09 VITALS — BP 146/78 | HR 67 | Temp 98.0°F | Ht 62.5 in | Wt 117.5 lb

## 2020-07-09 DIAGNOSIS — K529 Noninfective gastroenteritis and colitis, unspecified: Secondary | ICD-10-CM

## 2020-07-09 NOTE — Patient Instructions (Signed)
Creon Take 2 capsule with the first bite of each meal and 1 capsule with first bite of each snack. If this helps we can call in a prescription for you

## 2020-07-09 NOTE — Progress Notes (Signed)
Cephas Darby, MD [address]  [place]  [place], [place] [address]  Main: [phone]  Fax: [phone]    Gastroenterology Consultation  Referring Provider:     Ria Bush, MD Primary Care Physician:  Ria Bush, MD Primary Gastroenterologist:  Dr. Cephas Darby Reason for Consultation:     Chronic diarrhea        HPI:   [name] [name] is a 80 y.o. female referred by Dr. Ria Bush, MD  for consultation & management of chronic diarrhea.  Patient reports that since Thanksgiving, she has been experiencing several bouts of nonbloody watery diarrhea, up to 15 times daily.  She started taking Imodium and the diarrhea is currently controlled to twice daily however her bowel movements are still watery.  She also reports nocturnal diarrhea.  She lost about 7 pounds since November.  She reports lack of appetite as well.  She does report mild left-sided abdominal pain, denies nausea, vomiting, rectal bleeding, abdominal bloating.  She underwent stool studies, negative for infection including C. difficile, TSH normal, fecal fat normal, does have mild normocytic anemia.  She has history of COPD on home oxygen, she does not drink alcohol Patient denies any new medications within last 6 months.  She was treated for COPD exacerbation in November 2021 with antibiotics and prednisone  NSAIDs: None  Antiplts/Anticoagulants/Anti thrombotics: None  GI Procedures: Colonoscopy in 04/2014 for surveillance Severe left-sided diverticulosis with narrowing of the sigmoid colon  Past Medical History:  Diagnosis Date  . Chronic cystitis    recurrent UTI (4+ in last year), on suppressive doxy (uro Cope)  . Colon polyp   . 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 Depression with anxiety   . Diverticular disease 10/09/09   CT abd no sigmoid mass DDD L5/S1  . Dyslipidemia   . GERD (gastroesophageal reflux disease)   . Hematuria    h/o kidney stones  . Hiatal hernia   .  Hypertension   . Left scapholunate ligament tear 2014   found on imaging  . Lung cancer ([place]) 1987   Partial RLL Lobectomy.  . Osteoarthritis   . Osteopenia 08/2011   DEXA: L femur -1.6  . Peptic ulcer   . Spinal abscess ([place]) 2000   secondary to periodontal disease    Past Surgical History:  Procedure Laterality Date  . BLADDER SURGERY    . BREAST BIOPSY Left 03/99   neg  . CHOLECYSTECTOMY  02/10/09  . COLONOSCOPY WITH PROPOFOL N/A 04/30/2014   Procedure: COLONOSCOPY WITH PROPOFOL;  Surgeon: Irene Shipper, MD;  Location: [facility];  Service: Endoscopy;  Laterality: N/A;  . CYSTOSCOPY  02/11/07 and 09/25/08   Dr. Jacqlyn Larsen, normal  . CYSTOSCOPY  07/2019   WNL Erlene Quan)  . DEXA  08/2011   T femur -1.6, T spine -0.6  . [place] pneumonia sepsis due to gum surgery  . OOPHORECTOMY    . REPLACEMENT TOTAL KNEE  09/2009   Right  . TOTAL ABDOMINAL HYSTERECTOMY  07/18/01   benign reason, BSO, vag repair secondary prolapse  . TUBAL LIGATION     bilateral    Current Outpatient Medications:  .  albuterol (PROVENTIL) (2.5 MG/3ML) 0.083% nebulizer solution, Take every 4-6 hours as needed for shortness of breath, Disp: 75 mL, Rfl: 5 .  arformoterol (BROVANA) 15 MCG/2ML NEBU, Take 2 mLs (15 mcg total) by nebulization 2 (two) times daily., Disp: 120 mL, Rfl: 11 .  budesonide (PULMICORT) 0.5 MG/2ML nebulizer solution, USE THE CONTENTS OF ONE VIAL (2ML) BY NEBULIZATION TWICE A DAY., Disp: 180 mL, Rfl: 1 .  buPROPion (WELLBUTRIN SR) 150 MG 12 hr tablet, TAKE 1 TABLET BY MOUTH TWICE A DAY, Disp: 180 tablet, Rfl: 1 .  cholecalciferol (VITAMIN D) 1000 units tablet, Take 2,000 Units by mouth daily., Disp: , Rfl:  .  conjugated estrogens (PREMARIN) vaginal cream, Place 1 Applicatorful vaginally daily. Apply 0.5mg  (pea-sized amount)  just inside the vaginal introitus with a finger-tip on  Monday, Wednesday and Friday nights. (Patient taking differently: Place 1 Applicatorful  vaginally as needed. Apply 0.5mg  (pea-sized amount)  just inside the vaginal introitus with a finger-tip on  Monday, Wednesday and Friday nights.), Disp: 30 g, Rfl: 12 .  CRANBERRY PO, Take 2 tablets by mouth every morning. , Disp: , Rfl:  .  ezetimibe (ZETIA) 10 MG tablet, TAKE 1 TABLET BY MOUTH EVERY DAY, Disp: 90 tablet, Rfl: 0 .  losartan (COZAAR) 100 MG tablet, Take 1 tablet (100 mg total) by mouth daily., Disp: 90 tablet, Rfl: 1 .  metoprolol tartrate (LOPRESSOR) 25 MG tablet, TAKE 1 TABLET BY MOUTH TWICE A DAY, Disp: 180 tablet, Rfl: 3 .  Multiple Vitamins-Minerals (PRESERVISION AREDS 2) CAPS, Take 2 capsules by mouth daily., Disp: , Rfl:  .  pravastatin (PRAVACHOL) 80 MG tablet, TAKE 1 TABLET BY MOUTH EVERYDAY AT BEDTIME, Disp: 90 tablet, Rfl: 3 .  PROAIR HFA 108 (90 Base) MCG/ACT inhaler, TAKE 2 PUFFS BY MOUTH EVERY 4 HOURS AS NEEDED, Disp: 8.5 each, Rfl: 3 .  sertraline (ZOLOFT) 100 MG tablet, Take 0.5 tablets (50 mg total) by mouth daily., Disp: , Rfl:  .  tiotropium (SPIRIVA) 18 MCG inhalation capsule, INHALE THE CONTENTS OF 1 CAPSULE ONE DAILY *NOT FOR ORAL USE*., Disp: 90 capsule, Rfl: 2   Family History  Problem Relation Age of Onset  . Pancreatitis Mother   . Heart disease Father   . Hypertension Father   . Emphysema Father   . Arthritis Brother   . Hypertension Brother   . Cancer Maternal Aunt        ovarian cancer  . Kidney disease Neg Hx   . Prostate cancer Neg Hx   . Bladder Cancer Neg Hx      Social History   Tobacco Use  . Smoking status: Former Smoker    Packs/day: 3.00    Years: 34.00    Pack years: 102.00    Types: Cigarettes    Quit date: 05/29/1995    Years since quitting: 25.1  . Smokeless tobacco: Never Used  Vaping Use  . Vaping Use: Never used  Substance Use Topics  . Alcohol use: Yes    Alcohol/week: 0.0 standard drinks    Comment: wine occasionally  . Drug use: No    Allergies as of 07/09/2020 - Review Complete 07/09/2020  Allergen  Reaction Noted  . Keflex [cephalexin] Shortness Of Breath 08/02/2012  . Penicillins      Review of Systems:    All systems reviewed and negative except where noted in HPI.   Physical Exam:  BP (!) 146/78 (BP Location: Left Arm, Patient Position: Sitting)   Pulse 67   Temp 98 F (36.7 C) (Oral)   Ht 5' 2.5" (1.588 m)   Wt 117 lb 8 oz (53.3 kg)   BMI 21.15 kg/m  No LMP recorded. Patient has had a hysterectomy.  General:   Alert, thin built, pleasant and cooperative in NAD  Head:  Normocephalic and atraumatic, bitemporal wasting. Eyes:  Sclera clear, no icterus.   Conjunctiva pink. Ears:  Normal auditory acuity. Nose:  No deformity, discharge, or lesions. Mouth:  No deformity or lesions,oropharynx pink & moist. Neck:  Supple; no masses or thyromegaly. Lungs:  Respirations even and unlabored.  Clear throughout to auscultation.   No wheezes, crackles, or rhonchi. No acute distress. Heart:  Regular rate and rhythm; no murmurs, clicks, rubs, or gallops. Abdomen:  Normal bowel sounds. Soft, non-tender and non-distended without masses, hepatosplenomegaly or hernias noted.  No guarding or rebound tenderness.   Rectal: Not performed Msk:  Symmetrical without gross deformities. Good, equal movement & strength bilaterally. Pulses:  Normal pulses noted. Extremities:  No clubbing or edema.  No cyanosis. Neurologic:  Alert and oriented x3;  grossly normal neurologically. Skin:  Intact without significant lesions or rashes. No jaundice. Lymph Nodes:  No significant cervical adenopathy. Psych:  Alert and cooperative. Normal mood and affect.  Imaging Studies: No abdominal imaging  Assessment and Plan:   [name] [name] is a 80 y.o. pleasant Caucasian female with history of chronic tobacco use, COPD on home oxygen is seen in consultation for 3 months history of chronic nonbloody diarrhea with loss of appetite and unintentional weight loss.  Stool studies negative for infection, fecal fat  normal  Screen for celiac disease Pancreatic fecal elastase levels Empiric trial of pancreatic enzymes, samples provided If above work-up is negative, recommend colonoscopy with pediatric colonoscope to evaluate for microscopic colitis Also, recommend CT abdomen and pelvis to evaluate for any pancreas pathology based on above work-up   Follow up in 4 to 6 weeks   Cephas Darby, MD

## 2020-07-10 ENCOUNTER — Other Ambulatory Visit: Payer: Self-pay | Admitting: Family Medicine

## 2020-07-10 ENCOUNTER — Other Ambulatory Visit: Payer: Self-pay | Admitting: Gastroenterology

## 2020-07-10 DIAGNOSIS — K529 Noninfective gastroenteritis and colitis, unspecified: Secondary | ICD-10-CM | POA: Diagnosis not present

## 2020-07-11 LAB — CELIAC DISEASE PANEL
Endomysial IgA: NEGATIVE
IgA/Immunoglobulin A, Serum: 144 mg/dL (ref 64–422)
Transglutaminase IgA: <2 U/mL (ref 0–3)

## 2020-07-15 LAB — PANCREATIC ELASTASE, FECAL: Pancreatic Elastase, Fecal: 382 ug Elast./g (ref >200)

## 2020-07-21 ENCOUNTER — Telehealth: Payer: Self-pay

## 2020-07-21 NOTE — Telephone Encounter (Signed)
-----   Message from Lin Landsman, MD sent at 07/20/2020  6:52 PM EST ----- The stool test came back normal for pancreas function.  If she is still having diarrhea on pancreatic enzymes, recommend colonoscopy  RV

## 2020-07-21 NOTE — Telephone Encounter (Signed)
Tried to call patient on primary number and it was busy. Called patient mobile number and left a message for call back

## 2020-07-21 NOTE — Telephone Encounter (Signed)
Patient verbalized understanding and states her diarrhea has improved. She is still taking the creon samples

## 2020-07-24 ENCOUNTER — Telehealth: Payer: Self-pay

## 2020-07-24 ENCOUNTER — Telehealth: Payer: Self-pay | Admitting: Family Medicine

## 2020-07-24 NOTE — Telephone Encounter (Signed)
Pt had annual exam on 06/17/20; pt was instructed to take sertraline 50 mg daily; pt has finished the sertraline; pt said she saw GI doctor on 07/09/20 and pt thinks the Creon has also helped with diarrhea. Pt wants to know if should stop the sertraline 50 mg daily since out of med and try the mirtazapine. Pt said the diarrhea has basically stopped. Pt request cb after Dr Darnell Level has reviewed this note. [facility].

## 2020-07-24 NOTE — Telephone Encounter (Signed)
Patient is requesting a refill on the following : Patient can't remember the name of it she says it is for her Diarrhea. Please advise.  [facility] in [place]. EM

## 2020-07-25 ENCOUNTER — Telehealth: Payer: Self-pay | Admitting: Gastroenterology

## 2020-07-25 MED ORDER — MIRTAZAPINE 15 MG PO TABS: 15.0000 mg | ORAL_TABLET | Freq: Every day | ORAL | 3 refills | Status: DC

## 2020-07-25 MED ORDER — PANCRELIPASE (LIP-PROT-AMYL) 36000-114000 UNITS PO CPEP: ORAL_CAPSULE | ORAL | 0 refills | Status: DC

## 2020-07-25 NOTE — Telephone Encounter (Signed)
Gave samples of creon wants prescription for it now.

## 2020-07-25 NOTE — Telephone Encounter (Signed)
Yes let's transition off sertraline and onto remeron 15mg  nightly - sent to pharmacy. Continue wellbutrin 150mg  bid.  How is mood? remeron will make her sleepy, let us know if too sedating.

## 2020-07-25 NOTE — Telephone Encounter (Signed)
Spoke with pt asking if she was able to remember diarrhea med.  States it is Creon and was given samples from GI.  I suggested she contact them to let them know it helped and to get rx.  Pt verbalizes understanding.

## 2020-07-25 NOTE — Telephone Encounter (Signed)
Let's give for 1 month  RV

## 2020-07-25 NOTE — Telephone Encounter (Signed)
Spoke with pt relaying Dr. Synthia Innocent message.  She verbalizes understanding.  Says her mood is fine.

## 2020-07-25 NOTE — Telephone Encounter (Signed)
Per Dr. Marius Ditch it okay to give one month supply. Sent medication to the pharmacy

## 2020-07-25 NOTE — Telephone Encounter (Signed)
Patient called LVM asking for call  Prescription in on Creon.

## 2020-08-01 DIAGNOSIS — J439 Emphysema, unspecified: Secondary | ICD-10-CM | POA: Diagnosis not present

## 2020-08-01 DIAGNOSIS — J309 Allergic rhinitis, unspecified: Secondary | ICD-10-CM | POA: Diagnosis not present

## 2020-08-01 DIAGNOSIS — R0902 Hypoxemia: Secondary | ICD-10-CM | POA: Diagnosis not present

## 2020-08-01 DIAGNOSIS — J449 Chronic obstructive pulmonary disease, unspecified: Secondary | ICD-10-CM | POA: Diagnosis not present

## 2020-08-01 DIAGNOSIS — J432 Centrilobular emphysema: Secondary | ICD-10-CM | POA: Diagnosis not present

## 2020-08-04 ENCOUNTER — Other Ambulatory Visit: Payer: Self-pay

## 2020-08-04 MED ORDER — BUDESONIDE 0.5 MG/2ML IN SUSP: RESPIRATORY_TRACT | 1 refills | Status: DC

## 2020-08-04 NOTE — Telephone Encounter (Signed)
Rx for Pulmicort neb solution has been sent to [facility] as requested by pharmacy.

## 2020-08-12 ENCOUNTER — Ambulatory Visit (INDEPENDENT_AMBULATORY_CARE_PROVIDER_SITE_OTHER): Payer: PPO | Admitting: Family Medicine

## 2020-08-12 ENCOUNTER — Encounter: Payer: Self-pay | Admitting: Family Medicine

## 2020-08-12 ENCOUNTER — Other Ambulatory Visit: Payer: Self-pay

## 2020-08-12 DIAGNOSIS — M898X7 Other specified disorders of bone, ankle and foot: Secondary | ICD-10-CM

## 2020-08-12 NOTE — Progress Notes (Signed)
[name], female    DOB: Jun 08, 1940, 80 y.o.   MRN: [id number]  This visit was conducted in person.  BP (!) 144/82 (BP Location: Left Arm, Patient Position: Sitting, Cuff Size: Large)   Pulse 67   Temp (!) 97.5 F (36.4 C) (Temporal)   Ht 5' 2.5" (1.588 m)   Wt 119 lb 3.2 oz (54.1 kg)   SpO2 99%   BMI 21.45 kg/m    CC: L foot injury  Subjective:   HPI: [name] [name] is a 80 y.o. female presenting on 08/12/2020 for Foot Injury (Left foot , patient was trying to remove some "callus" on the bottom of her foot and injured herself)   Clipped callus on bottom of left foot - residual pain ever since. Initial bleed, none recently.       Relevant past medical, surgical, family and social history reviewed and updated as indicated. Interim medical history since our last visit reviewed. Allergies and medications reviewed and updated. Outpatient Medications Prior to Visit  Medication Sig Dispense Refill  . albuterol (PROVENTIL) (2.5 MG/3ML) 0.083% nebulizer solution Take every 4-6 hours as needed for shortness of breath 75 mL 5  . arformoterol (BROVANA) 15 MCG/2ML NEBU Take 2 mLs (15 mcg total) by nebulization 2 (two) times daily. 120 mL 11  . budesonide (PULMICORT) 0.5 MG/2ML nebulizer solution 1 vial BID 180 mL 1  . buPROPion (WELLBUTRIN SR) 150 MG 12 hr tablet TAKE 1 TABLET BY MOUTH TWICE A DAY 180 tablet 1  . cholecalciferol (VITAMIN D) 1000 units tablet Take 2,000 Units by mouth daily.    Marland Kitchen conjugated estrogens (PREMARIN) vaginal cream Place 1 Applicatorful vaginally daily. Apply 0.5mg  (pea-sized amount)  just inside the vaginal introitus with a finger-tip on  Monday, Wednesday and Friday nights. (Patient taking differently: Place 1 Applicatorful vaginally as needed. Apply 0.5mg  (pea-sized amount)  just inside the vaginal introitus with a finger-tip on  Monday, Wednesday and Friday nights.) 30 g 12  . CRANBERRY PO Take 2 tablets by mouth every morning.     .  ezetimibe (ZETIA) 10 MG tablet TAKE 1 TABLET BY MOUTH EVERY DAY 90 tablet 0  . lipase/protease/amylase (CREON) 36000 UNITS CPEP capsule Take 2 capsules with the first bite of each meal and 1 capsule with the fist bite of each snack 240 capsule 0  . losartan (COZAAR) 100 MG tablet TAKE 1 TABLET BY MOUTH EVERY DAY 90 tablet 1  . metoprolol tartrate (LOPRESSOR) 25 MG tablet TAKE 1 TABLET BY MOUTH TWICE A DAY 180 tablet 3  . mirtazapine (REMERON) 15 MG tablet Take 1 tablet (15 mg total) by mouth at bedtime. 30 tablet 3  . Multiple Vitamins-Minerals (PRESERVISION AREDS 2) CAPS Take 2 capsules by mouth daily.    . pravastatin (PRAVACHOL) 80 MG tablet TAKE 1 TABLET BY MOUTH EVERYDAY AT BEDTIME 90 tablet 3  . PROAIR HFA 108 (90 Base) MCG/ACT inhaler TAKE 2 PUFFS BY MOUTH EVERY 4 HOURS AS NEEDED 8.5 each 3  . tiotropium (SPIRIVA) 18 MCG inhalation capsule INHALE THE CONTENTS OF 1 CAPSULE ONE DAILY *NOT FOR ORAL USE*. 90 capsule 2   No facility-administered medications prior to visit.     Per HPI unless specifically indicated in ROS section below Review of Systems Objective:  BP (!) 144/82 (BP Location: Left Arm, Patient Position: Sitting, Cuff Size: Large)   Pulse 67   Temp (!) 97.5 F (36.4 C) (Temporal)   Ht 5' 2.5" (1.588 m)  Wt 119 lb 3.2 oz (54.1 kg)   SpO2 99%   BMI 21.45 kg/m   Wt Readings from Last 3 Encounters:  08/12/20 119 lb 3.2 oz (54.1 kg)  07/09/20 117 lb 8 oz (53.3 kg)  06/17/20 116 lb 5 oz (52.8 kg)      Physical Exam Vitals and nursing note reviewed.  Constitutional:      Appearance: Normal appearance. She is not ill-appearing.     Comments: Supplemental O2 in place  Musculoskeletal:        General: Tenderness present. No swelling.     Right lower leg: No edema.     Left lower leg: No edema.     Comments:  2+ DP on left Discomfort to palpation at head of 3rd metatarsal at metatarsal pad at sole of left foot  Skin:    General: Skin is warm and dry.     Findings:  No erythema or rash.     Comments: Healed spot on left sole without residual scab or rash  Neurological:     Mental Status: She is alert.       Results for orders placed or performed in visit on 07/10/20  Pancreatic elastase, fecal  Result Value Ref Range   Pancreatic Elastase, Fecal 382 >200 ug Elast./g   Assessment & Plan:  This visit occurred during the SARS-CoV-2 public health emergency.  Safety protocols were in place, including screening questions prior to the visit, additional usage of staff PPE, and extensive cleaning of exam room while observing appropriate contact time as indicated for disinfecting solutions.   Problem List Items Addressed This Visit    Pain in metatarsus of left foot    Area where she clipped callus has actually healed well.  Ongoing pain at sole at mid metatarsal pad. Anticipate metatarsal collapse leading to ongoing soreness. Placed in metatarsal pad with benefit. Update if ongoing pain.  Discussed supportive foot wear.           No orders of the defined types were placed in this encounter.  No orders of the defined types were placed in this encounter.   Patient Instructions  Foot skin looking ok - likely callus causing pain.  Try metatarsal pad to left shoe.   Follow up plan: Return if symptoms worsen or fail to improve.  Ria Bush, MD

## 2020-08-12 NOTE — Patient Instructions (Signed)
Foot skin looking ok - likely callus causing pain.  Try metatarsal pad to left shoe.

## 2020-08-12 NOTE — Assessment & Plan Note (Addendum)
Area where she clipped callus has actually healed well.  Ongoing pain at sole at mid metatarsal pad. Anticipate metatarsal collapse leading to ongoing soreness. Placed in metatarsal pad with benefit. Update if ongoing pain.  Discussed supportive foot wear.

## 2020-08-20 ENCOUNTER — Other Ambulatory Visit: Payer: Self-pay | Admitting: Family Medicine

## 2020-08-25 DIAGNOSIS — R0902 Hypoxemia: Secondary | ICD-10-CM | POA: Diagnosis not present

## 2020-08-25 DIAGNOSIS — J439 Emphysema, unspecified: Secondary | ICD-10-CM | POA: Diagnosis not present

## 2020-08-25 DIAGNOSIS — J432 Centrilobular emphysema: Secondary | ICD-10-CM | POA: Diagnosis not present

## 2020-08-25 DIAGNOSIS — J309 Allergic rhinitis, unspecified: Secondary | ICD-10-CM | POA: Diagnosis not present

## 2020-09-02 ENCOUNTER — Encounter: Payer: Self-pay | Admitting: *Deleted

## 2020-09-02 ENCOUNTER — Ambulatory Visit: Payer: PPO | Admitting: Gastroenterology

## 2020-09-04 ENCOUNTER — Other Ambulatory Visit: Payer: Self-pay

## 2020-09-04 MED ORDER — TIOTROPIUM BROMIDE MONOHYDRATE 18 MCG IN CAPS: 18.0000 ug | ORAL_CAPSULE | Freq: Every day | RESPIRATORY_TRACT | 2 refills | Status: DC

## 2020-09-04 NOTE — Telephone Encounter (Signed)
Spiriva Last rx:  07/05/18, #90/2 Last OV:  08/13/18, L foot pain Next OV:  none

## 2020-09-18 DIAGNOSIS — J439 Emphysema, unspecified: Secondary | ICD-10-CM | POA: Diagnosis not present

## 2020-09-18 DIAGNOSIS — R0902 Hypoxemia: Secondary | ICD-10-CM | POA: Diagnosis not present

## 2020-09-18 DIAGNOSIS — J309 Allergic rhinitis, unspecified: Secondary | ICD-10-CM | POA: Diagnosis not present

## 2020-09-18 DIAGNOSIS — J432 Centrilobular emphysema: Secondary | ICD-10-CM | POA: Diagnosis not present

## 2020-10-14 ENCOUNTER — Telehealth: Payer: Self-pay

## 2020-10-14 ENCOUNTER — Ambulatory Visit (INDEPENDENT_AMBULATORY_CARE_PROVIDER_SITE_OTHER): Payer: PPO | Admitting: Family Medicine

## 2020-10-14 ENCOUNTER — Other Ambulatory Visit: Payer: Self-pay

## 2020-10-14 ENCOUNTER — Encounter: Payer: Self-pay | Admitting: Family Medicine

## 2020-10-14 VITALS — BP 154/80 | HR 78 | Temp 97.9°F | Ht 62.5 in | Wt 123.2 lb

## 2020-10-14 DIAGNOSIS — J441 Chronic obstructive pulmonary disease with (acute) exacerbation: Secondary | ICD-10-CM

## 2020-10-14 DIAGNOSIS — I10 Essential (primary) hypertension: Secondary | ICD-10-CM

## 2020-10-14 DIAGNOSIS — R0602 Shortness of breath: Secondary | ICD-10-CM

## 2020-10-14 DIAGNOSIS — J439 Emphysema, unspecified: Secondary | ICD-10-CM

## 2020-10-14 DIAGNOSIS — J9611 Chronic respiratory failure with hypoxia: Secondary | ICD-10-CM | POA: Diagnosis not present

## 2020-10-14 MED ORDER — PREDNISONE 20 MG PO TABS: ORAL_TABLET | ORAL | 0 refills | Status: DC

## 2020-10-14 NOTE — Assessment & Plan Note (Signed)
BP elevated today despite meds - she may have recently taken decongestants.

## 2020-10-14 NOTE — Telephone Encounter (Signed)
[name] [name] - Client TELEPHONE ADVICE RECORD [facility] Patient Name: [name] [name] [facility] Gender: Female DOB: 1941/03/23 Age: 80 Y 68 M 23 D Return Phone Number: [phone] (Primary) Address: City/ State/ Zip: [place] [address] Client [name] - Client Client Site [place] - [name] Physician Ria Bush - MD Contact Type Call Who Is Calling Patient / Member / Family / Caregiver Call Type Triage / Clinical Relationship To Patient Self Return Phone Number [phone] (Primary) Chief Complaint BREATHING - shortness of breath or sounds breathless Reason for Call Symptomatic / Request for [name] states she is having trouble catching her breath. She has COPD. Translation No Nurse Assessment Nurse: Claiborne Billings, RN, Kim Date/Time (Eastern Time): 10/14/2020 10:43:06 AM Confirm and document reason for call. If symptomatic, describe symptoms. ---Caller states she has COPD, has had a hard time catching her breath since Friday. States the pollen has "gotten to me this year". No chest pain or pressure, no fever. Does the patient have any new or worsening symptoms? ---Yes Will a triage be completed? ---Yes Related visit to physician within the last 2 weeks? ---No Does the PT have any chronic conditions? (i.e. diabetes, asthma, this includes High risk factors for pregnancy, etc.) ---Yes List chronic conditions. ---COPD, HTN, Arthritis Is this a behavioral health or substance abuse call? ---No Guidelines Guideline Title Affirmed Question Affirmed Notes Nurse Date/Time (Eastern Time) COPD Oxygen Monitoring and Hypoxia [1] MODERATE difficulty breathing (e.g., speaks in phrases, SOB even at rest) AND [2] worse than normal [name] 10/14/2020 10:45:23 AM PLEASE NOTE: All timestamps contained within this report are represented as [place] Standard  Time. CONFIDENTIALTY NOTICE: This fax transmission is intended only for the addressee. It contains information that is legally privileged, confidential or otherwise protected from use or disclosure. If you are not the intended recipient, you are strictly prohibited from reviewing, disclosing, copying using or disseminating any of this information or taking any action in reliance on or regarding this information. If you have received this fax in error, please notify us immediately by telephone so that we can arrange for its return to [place]. Phone: [phone], Toll-Free: [phone], Fax: [phone] Page: 2 of 2 Call Id: [id number] [place]. Time [name] Time) Disposition Final User 10/14/2020 10:41:32 AM Send to Urgent Queue [name] 10/14/2020 10:49:16 AM Go to ED Now (or PCP triage) Yes Claiborne Billings, RN, Max Sane Disagree/Comply Disagree Caller Understands Yes PreDisposition Did not know what to do Care Advice Given Per Guideline GO TO ED NOW (OR PCP TRIAGE): * IF NO PCP (PRIMARY CARE PROVIDER) SECOND-LEVEL TRIAGE: You need to be seen within the next hour. Go to the [facility] at _____________ [place] as soon as you can. BRING MEDICINES: * Bring a list of your current medicines when you go to the Emergency Department (ER). CARE ADVICE given per COPD Oxygen Monitoring and Hypoxia (Adult) guideline. Comments User: [name], RN Date/Time [name] Time): 10/14/2020 10:49:07 AM O2 sat checked during triage and is ranging between 92 and 95% User: [name], RN Date/Time [name] Time): 10/14/2020 10:51:20 AM Caller states she does not want to go to the ER "and have to sit and wait for 14 hrs". States she is going to call the office to see if she can get an office appt instead. Referrals GO TO FACILITY REFUSED

## 2020-10-14 NOTE — Assessment & Plan Note (Deleted)
Continued oxygen use.

## 2020-10-14 NOTE — Telephone Encounter (Signed)
Will see today.  

## 2020-10-14 NOTE — Assessment & Plan Note (Signed)
Increased dyspnea with poor air movement but without signs of bacterial infection (sputum production, fever, significant cough). Rx prednisone taper with red flags to seek urgent in-person care.  With increased dyspnea, check COVID swab.

## 2020-10-14 NOTE — Progress Notes (Signed)
[name], female    DOB: 05-07-1941, 80 y.o.   MRN: [id number]  This visit was conducted in person.  BP (!) 154/80   Pulse 78   Temp 97.9 F (36.6 C) (Temporal)   Ht 5' 2.5" (1.588 m)   Wt 123 lb 4 oz (55.9 kg)   SpO2 100% Comment: 3 L  BMI 22.18 kg/m    CC: dyspnea Subjective:   HPI: [name] [name] is a 80 y.o. female presenting on 10/14/2020 for Shortness of Breath (C/o trouble breathing and throat congestion with the change in weather.  Sxs started 10/09/20.  Pt accompanied by son, Aaron Edelman- temp 98.0.)   Forgot bp meds this morning.   Several months of progressive worsening dyspnea (correlating with increased pollen), acutely worse 5d h/o dyspnea, sore throat, chest congestion, without increased cough. Poor appetite, increased fatigue. No significant cough or sputum production. Notes more difficulty with recent warmer weather.   No fevers/chills, abd pain, nausea, diarrhea.   Known COPD on pulmicort/brovana BID and spiriva nightly ,uses albuterol neb PRN.  No sick contacts at home.  Treating with mucinex with benefit - may have taken medication with decongestant.      Relevant past medical, surgical, family and social history reviewed and updated as indicated. Interim medical history since our last visit reviewed. Allergies and medications reviewed and updated. Outpatient Medications Prior to Visit  Medication Sig Dispense Refill  . albuterol (PROVENTIL) (2.5 MG/3ML) 0.083% nebulizer solution Take every 4-6 hours as needed for shortness of breath 75 mL 5  . arformoterol (BROVANA) 15 MCG/2ML NEBU Take 2 mLs (15 mcg total) by nebulization 2 (two) times daily. 120 mL 11  . budesonide (PULMICORT) 0.5 MG/2ML nebulizer solution 1 vial BID 180 mL 1  . buPROPion (WELLBUTRIN SR) 150 MG 12 hr tablet TAKE 1 TABLET BY MOUTH TWICE A DAY 180 tablet 1  . cholecalciferol (VITAMIN D) 1000 units tablet Take 2,000 Units by mouth daily.    Marland Kitchen conjugated estrogens  (PREMARIN) vaginal cream Place 1 Applicatorful vaginally daily. Apply 0.5mg  (pea-sized amount)  just inside the vaginal introitus with a finger-tip on  Monday, Wednesday and Friday nights. (Patient taking differently: Place 1 Applicatorful vaginally as needed. Apply 0.5mg  (pea-sized amount)  just inside the vaginal introitus with a finger-tip on  Monday, Wednesday and Friday nights.) 30 g 12  . CRANBERRY PO Take 2 tablets by mouth every morning.     . ezetimibe (ZETIA) 10 MG tablet TAKE 1 TABLET BY MOUTH EVERY DAY 90 tablet 3  . losartan (COZAAR) 100 MG tablet TAKE 1 TABLET BY MOUTH EVERY DAY 90 tablet 1  . metoprolol tartrate (LOPRESSOR) 25 MG tablet TAKE 1 TABLET BY MOUTH TWICE A DAY 180 tablet 3  . mirtazapine (REMERON) 15 MG tablet Take 1 tablet (15 mg total) by mouth at bedtime. 30 tablet 3  . Multiple Vitamins-Minerals (PRESERVISION AREDS 2) CAPS Take 2 capsules by mouth daily.    . pravastatin (PRAVACHOL) 80 MG tablet TAKE 1 TABLET BY MOUTH EVERYDAY AT BEDTIME 90 tablet 3  . PROAIR HFA 108 (90 Base) MCG/ACT inhaler TAKE 2 PUFFS BY MOUTH EVERY 4 HOURS AS NEEDED 8.5 each 3  . tiotropium (SPIRIVA) 18 MCG inhalation capsule Place 1 capsule (18 mcg total) into inhaler and inhale daily. 90 capsule 2  . lipase/protease/amylase (CREON) 36000 UNITS CPEP capsule Take 2 capsules with the first bite of each meal and 1 capsule with the fist bite of each snack  240 capsule 0   No facility-administered medications prior to visit.     Per HPI unless specifically indicated in ROS section below Review of Systems Objective:  BP (!) 154/80   Pulse 78   Temp 97.9 F (36.6 C) (Temporal)   Ht 5' 2.5" (1.588 m)   Wt 123 lb 4 oz (55.9 kg)   SpO2 100% Comment: 3 L  BMI 22.18 kg/m   Wt Readings from Last 3 Encounters:  10/14/20 123 lb 4 oz (55.9 kg)  08/12/20 119 lb 3.2 oz (54.1 kg)  07/09/20 117 lb 8 oz (53.3 kg)      Physical Exam Constitutional:      Appearance: She is not ill-appearing.      Comments: Supplemental O2 by [place]  Cardiovascular:     Rate and Rhythm: Normal rate and regular rhythm.     Pulses: Normal pulses.     Heart sounds: Normal heart sounds. No murmur heard.   Pulmonary:     Effort: Pulmonary effort is normal. No respiratory distress.     Breath sounds: No wheezing, rhonchi or rales.     Comments: Tight breath sounds - decreased air movement throughout without significant wheeze  Musculoskeletal:     Right lower leg: No edema.     Left lower leg: No edema.  Skin:    General: Skin is warm and dry.  Neurological:     Mental Status: She is alert.  Psychiatric:        Mood and Affect: Mood normal.        Behavior: Behavior normal.       Results for orders placed or performed in visit on 07/10/20  Pancreatic elastase, fecal  Result Value Ref Range   Pancreatic Elastase, Fecal 382 >200 ug Elast./g   Assessment & Plan:  This visit occurred during the SARS-CoV-2 public health emergency.  Safety protocols were in place, including screening questions prior to the visit, additional usage of staff PPE, and extensive cleaning of exam room while observing appropriate contact time as indicated for disinfecting solutions.   Problem List Items Addressed This Visit    Essential hypertension    BP elevated today despite meds - she may have recently taken decongestants.       GOLD 4 COPD with emphysema    Reviewed controller meds (brovana, pulmicort nebs and spiriva inhaler) and rescue meds (albuterol neb) and appropriate use. Reviewed limiting exposure to heat/humidity.       Relevant Medications   predniSONE (DELTASONE) 20 MG tablet   Chronic respiratory failure with hypoxia (HCC)   COPD exacerbation (HCC)    Increased dyspnea with poor air movement but without signs of bacterial infection (sputum production, fever, significant cough). Rx prednisone taper with red flags to seek urgent in-person care.  With increased dyspnea, check COVID swab.       Relevant  Medications   predniSONE (DELTASONE) 20 MG tablet    Other Visit Diagnoses    Shortness of breath    -  Primary   Relevant Orders   Novel Coronavirus, NAA ([facility])       Meds ordered this encounter  Medications  . predniSONE (DELTASONE) 20 MG tablet    Sig: Take two tablets daily for 4 days followed by one tablet daily for 4 days    Dispense:  12 tablet    Refill:  0   Orders Placed This Encounter  Procedures  . Novel Coronavirus, NAA ([facility])    Order Specific Question:  Is this test for diagnosis or screening    Answer:   Diagnosis of ill patient    Order Specific Question:   Symptomatic for COVID-19 as defined by CDC    Answer:   Yes    Order Specific Question:   Date of Symptom Onset    Answer:   10/09/2020    Order Specific Question:   Hospitalized for COVID-19    Answer:   No    Order Specific Question:   Admitted to ICU for COVID-19    Answer:   No    Order Specific Question:   Previously tested for COVID-19    Answer:   Yes    Order Specific Question:   Resident in a congregate (group) care setting    Answer:   No    Order Specific Question:   Is the patient student?    Answer:   No    Order Specific Question:   Employed in healthcare setting    Answer:   No    Order Specific Question:   Pregnant    Answer:   No    Order Specific Question:   Has patient completed COVID vaccination(s) (2 doses of Pfizer/Moderna 1 dose of [employer])    Answer:   Yes    Order Specific Question:   Has patient completed COVID Booster / 3rd dose    Answer:   Yes    Patient Instructions  Treat COPD flare with prednisone course.  Let us know if not improving with treatment. If worsening symptoms, please seek urgent in-person care.    Follow up plan: Return if symptoms worsen or fail to improve.  Ria Bush, MD

## 2020-10-14 NOTE — Patient Instructions (Signed)
Treat COPD flare with prednisone course.  Let us know if not improving with treatment. If worsening symptoms, please seek urgent in-person care.

## 2020-10-14 NOTE — Telephone Encounter (Signed)
Patient calls in and is triaged by access nurse who triaged her to go to the ER.  They route her back to the office after she refuses to go to the ER.   I spoke with patient to further assess and obtain clinical information:  Patient has COPD with occasional exacerbations.  She has noted increased chest congestion and difficulty breathing for weeks due to the pollen season.  In addition, she has increased her neb and inhaler usage but is still noting difficulty, is hoping to see Dr. Darnell Level for Prednisone RX as it has helped her in the past .    Symptoms:   Current O2SAT:  95% on 3.5L of O2,  HR: 69, TEMP: 97.5 Per patient current readings at home.   Respiratory:  Notable increased SOB with exertion and feeling generally weaker since Thursday.  Is able to speak normally between breaths. States this has been progressing for several weeks as the weather and pollen air conditions have worsened.  Feels like her COPD is exacerbating.  Has ongoing chest congestion Cough - Productive after neb treatment with grayish sputum  Cardiovascular - ongoing chronic BLE edema, Denies Chest pain  She denies any exposure to COVID and is not currently undergoing any testing.  Will come in at 430 today to see Dr. Danise Mina.

## 2020-10-14 NOTE — Assessment & Plan Note (Addendum)
Reviewed controller meds (brovana, pulmicort nebs and spiriva inhaler) and rescue meds (albuterol neb) and appropriate use. Reviewed limiting exposure to heat/humidity.

## 2020-10-15 LAB — NOVEL CORONAVIRUS, NAA: SARS-CoV-2, NAA: NOT DETECTED

## 2020-10-15 LAB — SARS-COV-2, NAA 2 DAY TAT

## 2020-10-21 DIAGNOSIS — J309 Allergic rhinitis, unspecified: Secondary | ICD-10-CM | POA: Diagnosis not present

## 2020-10-21 DIAGNOSIS — J439 Emphysema, unspecified: Secondary | ICD-10-CM | POA: Diagnosis not present

## 2020-10-21 DIAGNOSIS — J432 Centrilobular emphysema: Secondary | ICD-10-CM | POA: Diagnosis not present

## 2020-10-21 DIAGNOSIS — R0902 Hypoxemia: Secondary | ICD-10-CM | POA: Diagnosis not present

## 2020-12-16 ENCOUNTER — Other Ambulatory Visit: Payer: Self-pay | Admitting: Internal Medicine

## 2020-12-18 ENCOUNTER — Telehealth: Payer: Self-pay | Admitting: Urology

## 2020-12-21 ENCOUNTER — Encounter: Payer: Self-pay | Admitting: Family Medicine

## 2020-12-22 MED ORDER — PREDNISONE 20 MG PO TABS: ORAL_TABLET | ORAL | 0 refills | Status: DC

## 2020-12-22 NOTE — Addendum Note (Signed)
Addended by: Ria Bush on: 12/22/2020 09:40 AM   Modules accepted: Orders

## 2020-12-22 NOTE — Telephone Encounter (Signed)
I spoke with pt; pt last seen 10/14/20 with SOB and given prednisone. Pt said she got a lot better and prednisone started wearing off about 3 wks after starting prednisone. Today pt said no energy,pt feels very weak and does not feel like even making a cup of coffee. Pt said she aches all over and does have wheezing. Pt has SOB with COPD and the SOB has been worse for last 2 - 3 weeks especially upon exertion. Pt has also had runny nose. A lot of sinus drainage. No CP.  Pt son is out of town this morning but will return this afternoon and pt will go to [facility] for eval and possible testing. Pt has not been covid tested recently. Sending not to Dr Danise Mina and Lattie Haw CMA with teams to Dr Darnell Level also.UC & ED precautions given and pt voiced understanding.

## 2020-12-22 NOTE — Telephone Encounter (Signed)
Plz triage pt.  °

## 2020-12-22 NOTE — Telephone Encounter (Addendum)
I think reasonable to try prednisone taper first in known O2 dependent COPD.  Seek in person care if any worsening symptoms.  I've sent in [facility]

## 2020-12-25 ENCOUNTER — Other Ambulatory Visit: Payer: Self-pay | Admitting: Internal Medicine

## 2021-01-12 ENCOUNTER — Telehealth: Payer: Self-pay | Admitting: Family Medicine

## 2021-01-12 NOTE — Chronic Care Management (AMB) (Signed)
  Chronic Care Management   Note  01/12/2021 Name: [name] MRN: [id number] DOB: August 20, 1940  [name] [name] is a 80 y.o. year old female who is a primary care patient of Ria Bush, MD. I reached out to Norman Herrlich by phone today in response to a referral sent by Ms. [name] Morgan [facility]'s PCP, Ria Bush, MD.   [name] [name] was given information about Chronic Care Management services today including:  CCM service includes personalized support from designated clinical staff supervised by her physician, including individualized plan of care and coordination with other care providers 24/7 contact phone numbers for assistance for urgent and routine care needs. Service will only be billed when office clinical staff spend 20 minutes or more in a month to coordinate care. Only one practitioner may furnish and bill the service in a calendar month. The patient may stop CCM services at any time (effective at the end of the month) by phone call to the office staff.   Patient agreed to services and verbal consent obtained.   Follow up plan:   Tatjana Secretary/administrator

## 2021-01-20 ENCOUNTER — Telehealth: Payer: Self-pay

## 2021-01-20 NOTE — Telephone Encounter (Signed)
[name] [name] with [facility] said pt requesting refill albuterol for nebulizer; I advise she would need to contact pulmonology Dr Mortimer Fries. [name] [name] voiced understanding and appreciative. Nothing further needed at this time.

## 2021-02-10 ENCOUNTER — Other Ambulatory Visit: Payer: Self-pay | Admitting: Family Medicine

## 2021-02-11 NOTE — Progress Notes (Signed)
03/02/21 3:41 PM   [name] [name] 11/03/1940 [id number]  Referring provider:  Ria Bush, MD [address] [place],  [place] [address]  Chief Complaint  Patient presents with   Vaginal Atrophy   Urological history  High risk hematuria  - Former smoker  - CT urogram on 07/2019 was within normal limits, kidneys are within normal limits. No lesions or calculi, or hydronephrosis.  - Cystoscopy with Dr. Erlene Quan on 07/2019 NED - no reports of gross heme - UA negative for micro heme  2. Vaginal atrophy  - Managed with vaginal estrogen   3. Recurrent Urinary tract infections  - no documented positive urine cultures over the last year  4. Urge incontinence  - On conservative management   HPI: [name] [name] is a 80 y.o.female who presents today for 1 year follow-up with UA.   Urine was negative for microscopic hematuria today.   She reports today that she has not seen any blood in her urine.   She uses her vaginal estrogen cream when she remembers.   She has not experienced daytime leakage, but she does experience leakage at night. She wear oxygen at night. She was offered OAB medicine today.   PMH: Past Medical History:  Diagnosis Date   Chronic cystitis    recurrent UTI (4+ in last year), on suppressive doxy (uro [place]ope)   Colon polyp    COPD, severe 01/27/07       Depression with anxiety    Diverticular disease 10/09/09   CT abd no sigmoid mass DDD L5/S1   Dyslipidemia    GERD (gastroesophageal reflux disease)    Hematuria    h/o kidney stones   Hiatal hernia    Hypertension    Left scapholunate ligament tear 2014   found on imaging   Lung cancer ([facility]) 1987   Partial RLL Lobectomy.   Osteoarthritis    Osteopenia 08/2011   DEXA: L femur -1.6   Peptic ulcer    Spinal abscess ([facility]) 2000   secondary to periodontal disease    Surgical History: Past Surgical History:  Procedure Laterality Date   BLADDER SURGERY     BREAST BIOPSY Left 03/99    neg   CHOLECYSTECTOMY  02/10/09   COLONOSCOPY WITH PROPOFOL N/A 04/30/2014   Procedure: COLONOSCOPY WITH PROPOFOL;  Surgeon: Irene Shipper, MD;  Location: [facility];  Service: Endoscopy;  Laterality: N/A;   CYSTOSCOPY  02/11/07 and 09/25/08   Dr. Jacqlyn Larsen, normal   CYSTOSCOPY  07/2019   WNL Erlene Quan)   DEXA  08/2011   T femur -1.6, T spine -0.6   MOUTH SURGERY  2000   [facility] pneumonia sepsis due to gum surgery   OOPHORECTOMY     REPLACEMENT TOTAL KNEE  09/2009   Right   TOTAL ABDOMINAL HYSTERECTOMY  07/18/01   benign reason, BSO, vag repair secondary prolapse   TUBAL LIGATION     bilateral    Home Medications:  Allergies as of 02/12/2021       Reactions   Keflex [cephalexin] Shortness Of Breath   Penicillins    REACTION: hives        Medication List        Accurate as of February 12, 2021 11:59 PM. If you have any questions, ask your nurse or doctor.          Brovana 15 MCG/2ML Nebu Generic drug: arformoterol USE 1 VIAL  IN  NEBULIZER TWICE  DAILY - (Morning And Evening)  budesonide 0.5 MG/2ML nebulizer solution Commonly known as: PULMICORT INHALE 1 VIAL VIA NEBULIZER TWICE DAILY   buPROPion 150 MG 12 hr tablet Commonly known as: WELLBUTRIN SR TAKE 1 TABLET BY MOUTH TWICE A DAY   cholecalciferol 1000 units tablet Commonly known as: VITAMIN D Take 2,000 Units by mouth daily.   CRANBERRY PO Take 2 tablets by mouth every morning.   ezetimibe 10 MG tablet Commonly known as: ZETIA TAKE 1 TABLET BY MOUTH EVERY DAY   losartan 100 MG tablet Commonly known as: COZAAR TAKE 1 TABLET BY MOUTH EVERY DAY   metoprolol tartrate 25 MG tablet Commonly known as: LOPRESSOR TAKE 1 TABLET BY MOUTH TWICE A DAY   mirtazapine 15 MG tablet Commonly known as: REMERON TAKE ONE TABLET BY MOUTH AT BEDTIME   pravastatin 80 MG tablet Commonly known as: PRAVACHOL TAKE 1 TABLET BY MOUTH EVERYDAY AT BEDTIME   predniSONE 20 MG tablet Commonly known as:  DELTASONE Take two tablets daily for 3 days followed by one tablet daily for 4 days   Premarin vaginal cream Generic drug: conjugated estrogens Place 1 Applicatorful vaginally daily. Apply 0.5mg  (pea-sized amount)  just inside the vaginal introitus with a finger-tip on  Monday, Wednesday and Friday nights. What changed:  when to take this reasons to take this   PreserVision AREDS 2 Caps Take 2 capsules by mouth daily.   ProAir HFA 108 (90 Base) MCG/ACT inhaler Generic drug: albuterol TAKE 2 PUFFS BY MOUTH EVERY 4 HOURS AS NEEDED   albuterol (2.5 MG/3ML) 0.083% nebulizer solution Commonly known as: PROVENTIL USE 1 VIAL IN NEBULIZER DAILY - As Needed For Rescue   tiotropium 18 MCG inhalation capsule Commonly known as: SPIRIVA Place 1 capsule (18 mcg total) into inhaler and inhale daily.        Allergies:  Allergies  Allergen Reactions   Keflex [Cephalexin] Shortness Of Breath   Penicillins     REACTION: hives    Family History: Family History  Problem Relation Age of Onset   Pancreatitis Mother    Heart disease Father    Hypertension Father    Emphysema Father    Arthritis Brother    Hypertension Brother    Cancer Maternal Aunt        ovarian cancer   Kidney disease Neg Hx    Prostate cancer Neg Hx    Bladder Cancer Neg Hx     Social History:  reports that she quit smoking about 25 years ago. Her smoking use included cigarettes. She has a 102.00 pack-year smoking history. She has never used smokeless tobacco. She reports current alcohol use. She reports that she does not use drugs.   Physical Exam: BP (!) 148/67   Ht 5' 2.5" (1.588 m)   Wt 125 lb (56.7 kg)   BMI 22.50 kg/m   Constitutional:  Well nourished. Alert and oriented, No acute distress. HEENT: [place] AT, mask in place.  Trachea midline Cardiovascular: No clubbing, cyanosis, or edema. Respiratory: Normal respiratory effort, no increased work of breathing. Neurologic: Grossly intact, no focal  deficits, moving all 4 extremities. Psychiatric: Normal mood and affect.    Laboratory Data: Lab Results  Component Value Date   CREATININE 0.81 06/11/2020   Lab Results  Component Value Date   HGBA1C 5.2 06/11/2020   Component     Latest Ref Rng & Units 06/11/2020  Cholesterol     0 - 200 mg/dL 155  Triglycerides     0.0 - 149.0 mg/dL 102.0  HDL Cholesterol     >39.00 mg/dL 67.00  VLDL     0.0 - 40.0 mg/dL 20.4  LDL (calc)     0 - 99 mg/dL 68  Total CHOL/HDL Ratio      2  NonHDL      88.46   Results for orders placed or performed in visit on 02/12/21  Microscopic Examination   Urine  Result Value Ref Range   WBC, UA 0-5 0 - 5 /hpf   RBC 0-2 0 - 2 /hpf   Epithelial Cells (non renal) 0-10 0 - 10 /hpf   Casts Present (A) None seen /lpf   Cast Type Hyaline casts N/A   Bacteria, UA None seen None seen/Few  Urinalysis, Complete  Result Value Ref Range   Specific Gravity, UA 1.015 1.005 - 1.030   pH, UA 6.0 5.0 - 7.5   Color, UA Yellow Yellow   Appearance Ur Clear Clear   Leukocytes,UA Trace (A) Negative   Protein,UA Negative Negative/Trace   Glucose, UA Negative Negative   Ketones, UA Negative Negative   RBC, UA Negative Negative   Bilirubin, UA Negative Negative   Urobilinogen, Ur 0.2 0.2 - 1.0 mg/dL   Nitrite, UA Negative Negative   Microscopic Examination See below:   I have reviewed the labs.   Assessment & Plan:    High risk hematuria  - Hematuria workup completed 07/2019 with CT urogram and cystoscopy that showed NED  - UA today showed no hematuria  - No reports of gross hematuria   Vaginal atrophy  - Encouraged to continue using vaginal estrogen   Urge incontinence  - Worsens during the nighttime  - Offered OAB medications but she is not interested in starting a new medication  - continue conservative management    Return in about 1 year (around 02/12/2022) for UA .  [facility] [address], [place], [place] [address] [phone]  Zara Council, PA-C

## 2021-02-12 ENCOUNTER — Other Ambulatory Visit: Payer: Self-pay

## 2021-02-12 ENCOUNTER — Encounter: Payer: Self-pay | Admitting: Urology

## 2021-02-12 ENCOUNTER — Ambulatory Visit: Payer: Self-pay | Admitting: Urology

## 2021-02-12 ENCOUNTER — Ambulatory Visit: Payer: PPO | Admitting: Urology

## 2021-02-12 VITALS — BP 148/67 | Ht 62.5 in | Wt 125.0 lb

## 2021-02-12 DIAGNOSIS — R3129 Other microscopic hematuria: Secondary | ICD-10-CM | POA: Diagnosis not present

## 2021-02-12 DIAGNOSIS — N952 Postmenopausal atrophic vaginitis: Secondary | ICD-10-CM

## 2021-02-12 DIAGNOSIS — N3941 Urge incontinence: Secondary | ICD-10-CM

## 2021-02-13 LAB — URINALYSIS, COMPLETE
Bilirubin, UA: NEGATIVE
Glucose, UA: NEGATIVE
Ketones, UA: NEGATIVE
Nitrite, UA: NEGATIVE
Protein,UA: NEGATIVE
RBC, UA: NEGATIVE
Specific Gravity, UA: 1.015 (ref 1.005–1.030)
Urobilinogen, Ur: 0.2 mg/dL (ref 0.2–1.0)
pH, UA: 6 (ref 5.0–7.5)

## 2021-02-13 LAB — MICROSCOPIC EXAMINATION: Bacteria, UA: NONE SEEN

## 2021-02-17 ENCOUNTER — Telehealth: Payer: Self-pay | Admitting: Family Medicine

## 2021-02-17 ENCOUNTER — Encounter: Payer: Self-pay | Admitting: Family Medicine

## 2021-02-17 DIAGNOSIS — M47896 Other spondylosis, lumbar region: Secondary | ICD-10-CM | POA: Diagnosis not present

## 2021-02-17 DIAGNOSIS — M545 Low back pain, unspecified: Secondary | ICD-10-CM | POA: Diagnosis not present

## 2021-02-17 NOTE — Telephone Encounter (Signed)
Noted.  FYI to Dr. Darnell Level.

## 2021-02-17 NOTE — Telephone Encounter (Signed)
Noted  

## 2021-02-17 NOTE — Telephone Encounter (Signed)
Talk to [name] [name] pt. Son and he is going to try to take her to another dr Elray Buba and if that doesn't work out he will call us back to schedule apt.

## 2021-02-17 NOTE — Telephone Encounter (Signed)
Talk to pt son he going to take her to another dr. Gabriel Carina and he will make apt later

## 2021-02-17 NOTE — Telephone Encounter (Signed)
Plz call pt to schedule OV.

## 2021-02-25 ENCOUNTER — Ambulatory Visit: Payer: PPO | Admitting: Internal Medicine

## 2021-02-25 ENCOUNTER — Telehealth: Payer: Self-pay | Admitting: Pharmacist

## 2021-02-25 ENCOUNTER — Encounter: Payer: Self-pay | Admitting: Internal Medicine

## 2021-02-25 ENCOUNTER — Other Ambulatory Visit: Payer: Self-pay | Admitting: Internal Medicine

## 2021-02-25 ENCOUNTER — Other Ambulatory Visit: Payer: Self-pay

## 2021-02-25 VITALS — BP 122/80 | HR 57 | Temp 97.8°F | Ht 64.0 in | Wt 124.8 lb

## 2021-02-25 DIAGNOSIS — R0689 Other abnormalities of breathing: Secondary | ICD-10-CM

## 2021-02-25 DIAGNOSIS — J9611 Chronic respiratory failure with hypoxia: Secondary | ICD-10-CM

## 2021-02-25 DIAGNOSIS — J449 Chronic obstructive pulmonary disease, unspecified: Secondary | ICD-10-CM

## 2021-02-25 MED ORDER — PREDNISONE 10 MG PO TABS: 40.0000 mg | ORAL_TABLET | Freq: Every day | ORAL | 6 refills | Status: DC

## 2021-02-25 NOTE — Progress Notes (Signed)
[facility]    Date: 02/25/2021  MRN# [id number] [name] [name] [birth year]     SYNOPSIS The patient is a 79 year old female with a history of severe COPD maintained on nebulized Brovana, Pulmicort, Spiriva, Mucinex twice daily She uses oxygen at 3 L sometimes to 4 L with exertion. She is using brovana, pulmicort bid. She also uses albuterol MDI and nebulized about 2 to 3 times per day. She tries to remain active, walking with a walker. She lives in independent living, [place].  She continues to drive.   Cc Follow-up COPD  HPI No exacerbation at this time No evidence of heart failure at this time No evidence or signs of infection at this time No respiratory distress No fevers, chills, nausea, vomiting, diarrhea No evidence of lower extremity edema No evidence hemoptysis  Uses oxygen to survive Previous chest x-ray shows severe emphysema no  Patient had takedn prednisone 1 month ago and felt really well, patient would like to start daily prednisone  I have suggested that we start 10 mg daily and assess  I have explained all the risks and benefits of prednisone She has agreed to take the risk  Medication:    Current Outpatient Medications:    albuterol (PROVENTIL) (2.5 MG/3ML) 0.083% nebulizer solution, USE ONE VIAL IN NEBULIZER DAILY AS NEEDED FOR RESCUE, Disp: 120 mL, Rfl: 6   BROVANA 15 MCG/2ML NEBU, USE 1 VIAL  IN  NEBULIZER TWICE  DAILY - (Morning And Evening), Disp: 120 mL, Rfl: 0   budesonide (PULMICORT) 0.5 MG/2ML nebulizer solution, INHALE 1 VIAL VIA NEBULIZER TWICE DAILY, Disp: 120 mL, Rfl: 0   buPROPion (WELLBUTRIN SR) 150 MG 12 hr tablet, TAKE 1 TABLET BY MOUTH TWICE A DAY, Disp: 180 tablet, Rfl: 1   cholecalciferol (VITAMIN D) 1000 units tablet, Take 2,000 Units by mouth daily., Disp: , Rfl:    conjugated estrogens (PREMARIN) vaginal cream, Place 1 Applicatorful vaginally daily. Apply 0.5mg  (pea-sized amount)  just inside the  vaginal introitus with a finger-tip on  Monday, Wednesday and Friday nights. (Patient taking differently: Place 1 Applicatorful vaginally as needed. Apply 0.5mg  (pea-sized amount)  just inside the vaginal introitus with a finger-tip on  Monday, Wednesday and Friday nights.), Disp: 30 g, Rfl: 12   CRANBERRY PO, Take 2 tablets by mouth every morning. , Disp: , Rfl:    ezetimibe (ZETIA) 10 MG tablet, TAKE 1 TABLET BY MOUTH EVERY DAY, Disp: 90 tablet, Rfl: 3   losartan (COZAAR) 100 MG tablet, TAKE 1 TABLET BY MOUTH EVERY DAY, Disp: 90 tablet, Rfl: 1   metoprolol tartrate (LOPRESSOR) 25 MG tablet, TAKE 1 TABLET BY MOUTH TWICE A DAY, Disp: 180 tablet, Rfl: 3   mirtazapine (REMERON) 15 MG tablet, TAKE ONE TABLET BY MOUTH AT BEDTIME, Disp: 30 tablet, Rfl: 4   Multiple Vitamins-Minerals (PRESERVISION AREDS 2) CAPS, Take 2 capsules by mouth daily., Disp: , Rfl:    pravastatin (PRAVACHOL) 80 MG tablet, TAKE 1 TABLET BY MOUTH EVERYDAY AT BEDTIME, Disp: 90 tablet, Rfl: 3   predniSONE (DELTASONE) 20 MG tablet, Take two tablets daily for 3 days followed by one tablet daily for 4 days, Disp: 10 tablet, Rfl: 0   PROAIR HFA 108 (90 Base) MCG/ACT inhaler, TAKE 2 PUFFS BY MOUTH EVERY 4 HOURS AS NEEDED, Disp: 8.5 each, Rfl: 3   tiotropium (SPIRIVA) 18 MCG inhalation capsule, Place 1 capsule (18 mcg total) into inhaler and inhale daily., Disp: 90 capsule, Rfl: 2  Allergies:  Keflex [cephalexin] and Penicillins   Review of Systems:  Gen:  Denies  fever, sweats, chills weight loss  HEENT: Denies blurred vision, double vision, ear pain, eye pain, hearing loss, nose bleeds, sore throat Cardiac:  No dizziness, chest pain or heaviness, chest tightness,edema, No JVD Resp:   No cough, -sputum production, -shortness of breath,-wheezing, -hemoptysis,  Other:  All other systems negative  BP 122/80 (BP Location: Left Arm, Patient Position: Sitting, Cuff Size: Normal)   Pulse (!) 57   Temp 97.8 F (36.6 C)   Ht 5\' 4"   (1.626 m)   Wt 124 lb 12.8 oz (56.6 kg)   SpO2 97%   BMI 21.42 kg/m    Physical Examination:   General Appearance: No distress  EYES PERRLA, EOM intact.   NECK Supple, No JVD Pulmonary: normal breath sounds, No wheezing.  CardiovascularNormal S1,S2.  No m/r/g.   ALL OTHER ROS ARE NEGATIVE       Assessment and Plan:  80 year old white female with severe emphysema Gold stage D Due to her severe respiratory insufficiency patient continues to take nebulized medication she can no longer use traditional inhaler therapy Continue Brovana and Pulmicort nebs No signs of exacerbation at this time  End-stage COPD with chronic hypoxic respiratory failure Continue oxygen as prescribed She needs this for survival  Chronic shortness of breath and dyspnea exertion Definitely related to her COPD and respiratory insufficiency and deconditioned state   MEDICATION ADJUSTMENTS/LABS AND TESTS ORDERED: Continue nebulizers as prescribed Continue oxygen as prescribed Avoid sick contacts  Start PREDNISONE 10 mg daily    CURRENT MEDICATIONS REVIEWED AT LENGTH WITH PATIENT TODAY   Patient satisfied with Plan of action and management. All questions answered  Follow-up 1 year  Time spent 21 minutes   Houston Zapien Patricia Pesa, M.D.  [facility]  Medical Director [place] Director [facility] Cardio-Pulmonary Department

## 2021-02-25 NOTE — Progress Notes (Signed)
Chronic Care Management Pharmacy Assistant   Name: [name] [name]  MRN: [id number] DOB: [birth year]  Reason for Encounter:  Appointment: Telephone 03/04/21 @ 2pm  Recent office visits:  10/14/20 Danise Mina (PCP)- SOB. Start prednisone 20 mg. D/c Pancrelipase 36000 units.  Recent consult visits:  02/12/21 [facility] (Urology) - Microscopic hematuria. No med changes.   Hospital visits:  None in previous 6 months  Medications: Outpatient Encounter Medications as of 02/25/2021  Medication Sig   albuterol (PROVENTIL) (2.5 MG/3ML) 0.083% nebulizer solution USE 1 VIAL IN NEBULIZER DAILY - As Needed For Rescue   BROVANA 15 MCG/2ML NEBU USE 1 VIAL  IN  NEBULIZER TWICE  DAILY - (Morning And Evening)   budesonide (PULMICORT) 0.5 MG/2ML nebulizer solution INHALE 1 VIAL VIA NEBULIZER TWICE DAILY   buPROPion (WELLBUTRIN SR) 150 MG 12 hr tablet TAKE 1 TABLET BY MOUTH TWICE A DAY   cholecalciferol (VITAMIN D) 1000 units tablet Take 2,000 Units by mouth daily.   conjugated estrogens (PREMARIN) vaginal cream Place 1 Applicatorful vaginally daily. Apply 0.5mg  (pea-sized amount)  just inside the vaginal introitus with a finger-tip on  Monday, Wednesday and Friday nights. (Patient taking differently: Place 1 Applicatorful vaginally as needed. Apply 0.5mg  (pea-sized amount)  just inside the vaginal introitus with a finger-tip on  Monday, Wednesday and Friday nights.)   CRANBERRY PO Take 2 tablets by mouth every morning.    ezetimibe (ZETIA) 10 MG tablet TAKE 1 TABLET BY MOUTH EVERY DAY   losartan (COZAAR) 100 MG tablet TAKE 1 TABLET BY MOUTH EVERY DAY   metoprolol tartrate (LOPRESSOR) 25 MG tablet TAKE 1 TABLET BY MOUTH TWICE A DAY   mirtazapine (REMERON) 15 MG tablet TAKE ONE TABLET BY MOUTH AT BEDTIME   Multiple Vitamins-Minerals (PRESERVISION AREDS 2) CAPS Take 2 capsules by mouth daily.   pravastatin (PRAVACHOL) 80 MG tablet TAKE 1 TABLET BY MOUTH EVERYDAY AT BEDTIME   predniSONE (DELTASONE) 20 MG  tablet Take two tablets daily for 3 days followed by one tablet daily for 4 days   PROAIR HFA 108 (90 Base) MCG/ACT inhaler TAKE 2 PUFFS BY MOUTH EVERY 4 HOURS AS NEEDED   tiotropium (SPIRIVA) 18 MCG inhalation capsule Place 1 capsule (18 mcg total) into inhaler and inhale daily.   No facility-administered encounter medications on file as of 02/25/2021.   Have you seen any other providers since your last visit?  Patient states she has an appointment today (02/25/21) with Dr. Mortimer Fries in Pulmonary.  Any changes in your medications or health?  Patient states she fell last Monday and went to [facility], she was prescribed Tylenol  & Meloxicam.  Any side effects from any medications?  Patient states no side effects from meds.  Do you have an symptoms or problems not managed by your medications?  Patient states no other symptoms or problems.  Any concerns about your health right now?  Patient states no concerns about health.  Has your provider asked that you check blood pressure, blood sugar, or follow special diet at home?  Patient states she checks her BP and it runs in normal range as long as she takes her medicine. She does not check her glucose but she does follows a low salt diet.  Do you get any type of exercise on a regular basis?  Patient states she has not been exercising lately.  Can you think of a goal you would like to reach for your health?  Patient states no goals at this time.  Do you have any problems getting your medications?  Patient states no she switched from [facility] to [facility] and they have been wonderful.  Is there anything that you would like to discuss during the appointment?  Patient states she experiences Numbness in her feet and nail has no color to it. She also states she has been taking Balance of Nature vitamins and wants to know the benefits of it.  Please bring medications and supplements to appointment.  Star Rating Drugs: Pravastatin - filled  12/19/20 90D Losartan - filled 12/25/20 [place], [place] Clinical Pharmacists Assistant [phone]

## 2021-02-25 NOTE — Patient Instructions (Addendum)
Continue nebulizers as prescribed Continue oxygen as prescribed Avoid sick contacts  Start PREDNISONE 10 mg daily

## 2021-03-02 NOTE — Telephone Encounter (Signed)
Opened in error

## 2021-03-04 ENCOUNTER — Other Ambulatory Visit: Payer: Self-pay

## 2021-03-04 ENCOUNTER — Ambulatory Visit (INDEPENDENT_AMBULATORY_CARE_PROVIDER_SITE_OTHER): Payer: PPO | Admitting: Pharmacist

## 2021-03-04 DIAGNOSIS — M85852 Other specified disorders of bone density and structure, left thigh: Secondary | ICD-10-CM

## 2021-03-04 DIAGNOSIS — I10 Essential (primary) hypertension: Secondary | ICD-10-CM

## 2021-03-04 DIAGNOSIS — J439 Emphysema, unspecified: Secondary | ICD-10-CM

## 2021-03-04 DIAGNOSIS — I700 Atherosclerosis of aorta: Secondary | ICD-10-CM

## 2021-03-04 DIAGNOSIS — E785 Hyperlipidemia, unspecified: Secondary | ICD-10-CM

## 2021-03-04 DIAGNOSIS — F331 Major depressive disorder, recurrent, moderate: Secondary | ICD-10-CM

## 2021-03-04 DIAGNOSIS — I739 Peripheral vascular disease, unspecified: Secondary | ICD-10-CM

## 2021-03-04 NOTE — Progress Notes (Signed)
Chronic Care Management Pharmacy Note  03/04/2021 Name:  [name] [name] MRN:  [id number] DOB:  May 21, 1941  Summary: -Pt started daily prednisone 10 mg last week and reports feeling more energized and better overall; counseled on risks of chronic steroid use (elevated BP, BG, osteoporosis) -Pt has osteopenia with high FRAX score (29% overall, 15% hip) per DEXA from 09/2018 -Pt has not been taking ezetimibe for ~3 months since it was not transferred to her new pharmacy with the rest of her medications  Recommendations/Changes made from today's visit: -Advised to monitor BP at home while on prednisone -Recommend to start alendronate 70 mg weekly given high FRAX, new daily prednisone, and fall risk -Coordinated transfer of [facility] to [facility] per patient request   Subjective: [name] [name] is an 80 y.o. year old female who is a primary patient of Ria Bush, MD.  The CCM team was consulted for assistance with disease management and care coordination needs.    Engaged with patient by telephone for initial visit in response to provider referral for pharmacy case management and/or care coordination services.   Consent to Services:  The patient was given the following information about Chronic Care Management services today, agreed to services, and gave verbal consent: 1. CCM service includes personalized support from designated clinical staff supervised by the primary care provider, including individualized plan of care and coordination with other care providers 2. 24/7 contact phone numbers for assistance for urgent and routine care needs. 3. Service will only be billed when office clinical staff spend 20 minutes or more in a month to coordinate care. 4. Only one practitioner may furnish and bill the service in a calendar month. 5.The patient may stop CCM services at any time (effective at the end of the month) by phone call to the office staff. 6. The patient  will be responsible for cost sharing (co-pay) of up to 20% of the service fee (after annual deductible is met). Patient agreed to services and consent obtained.  Patient Care Team: Ria Bush, MD as PCP - General (Family Medicine) Rockey Situ Kathlene November, MD as Consulting Physician (Cardiology) Charlton Haws, [facility] as Pharmacist (Pharmacist)   Patient lives in assisted living with her own apartment. Her husband died 3 years ago. She has lunch every day with other residents. She does cook for herself sometimes. Wakes up 9:30-10am (daughter calls every day); she takes a nap every afternoon. She likes to read, reads in bed about 7:30-8pm, typically goes to sleep around 11-11:30pm. She wakes up about once per night to use bathroom.  Recent office visits: 12/21/20 PCP pt message - rx'd prednisone taper for COPD exacerbation. 10/14/20 Danise Mina (PCP)- SOB. Covid negative. Start prednisone 20 mg. D/c Pancrelipase 36000 units.  Recent consult visits: 02/25/21 Dr Mortimer Fries (pulmonary): f/u COPD; pt wants to start daily prednisone. Rx'd prednisone 10 mg daily  02/12/21 [facility] (Urology) - Microscopic hematuria, hx recurrent UTI. No med changes. Negative urinalysis.  Hospital visits: None in previous 6 months   Objective:  Lab Results  Component Value Date   CREATININE 0.81 06/11/2020   BUN 9 06/11/2020   GFR 69.14 06/11/2020   GFRNONAA >60 01/24/2020   GFRAA >60 01/24/2020   NA 141 06/11/2020   K 4.5 06/11/2020   CALCIUM 9.4 06/11/2020   CO2 40 (H) 06/11/2020   GLUCOSE 102 (H) 06/11/2020    Lab Results  Component Value Date/Time   HGBA1C 5.2 06/11/2020 11:30 AM   HGBA1C 5.3  03/27/2019 11:40 AM   GFR 69.14 06/11/2020 11:30 AM   GFR 51.84 (L) 05/19/2020 05:16 PM   MICROALBUR 0.9 09/29/2009 08:57 AM   MICROALBUR 0.1 09/19/2008 09:38 AM    Last diabetic Eye exam: No results found for: HMDIABEYEEXA  Last diabetic Foot exam: No results found for: HMDIABFOOTEX   Lab Results   Component Value Date   CHOL 155 06/11/2020   HDL 67.00 06/11/2020   LDLCALC 68 06/11/2020   LDLDIRECT 135.9 09/06/2011   TRIG 102.0 06/11/2020   CHOLHDL 2 06/11/2020    Hepatic Function Latest Ref Rng & Units 06/11/2020 05/19/2020 01/03/2020  Total Protein 6.0 - 8.3 g/dL 6.5 6.3 6.2  Albumin 3.5 - 5.2 g/dL 4.2 3.9 4.1  AST 0 - 37 U/L _0 ALT 0 - 35 U/L _1 Alk Phosphatase 39 - 117 U/L 42 41 31(L)  Total Bilirubin 0.2 - 1.2 mg/dL 0.5 0.3 0.5  Bilirubin, Direct 0.0 - 0.3 mg/dL - - -    Lab Results  Component Value Date/Time   TSH 1.39 05/19/2020 05:16 PM   TSH 1.10 01/03/2020 02:59 PM    CBC Latest Ref Rng & Units 06/11/2020 05/19/2020 01/24/2020  WBC 4.0 - 10.5 K/uL 7.6 5.8 6.1  Hemoglobin 12.0 - 15.0 g/dL 12.5 11.5(L) 12.8  Hematocrit 36.0 - 46.0 % 37.6 35.1(L) 38.9  Platelets 150.0 - 400.0 K/uL 169.0 189.0 149(L)    Lab Results  Component Value Date/Time   VD25OH 36.23 06/11/2020 11:30 AM   VD25OH 38.10 01/03/2020 02:59 PM    Clinical ASCVD: Yes  The 10-year ASCVD risk score (Arnett DK, et al., 2019) is: 30%   Values used to calculate the score:     Age: 80 years     Sex: Female     Is Non-Hispanic African American: No     Diabetic: No     Tobacco smoker: No     Systolic Blood Pressure: 654 mmHg     Is BP treated: Yes     HDL Cholesterol: 67 mg/dL     Total Cholesterol: 155 mg/dL    Depression screen [facility] 2/9 06/10/2020 03/27/2019 03/16/2018  Decreased Interest 1 0 0  Down, Depressed, Hopeless 1 0 0  PHQ - 2 Score 2 0 0  Altered sleeping 1 0 0  Tired, decreased energy 0 0 1  Change in appetite 0 0 0  Feeling bad or failure about yourself  0 0 0  Trouble concentrating 0 0 2  Moving slowly or fidgety/restless 0 0 0  Suicidal thoughts 0 0 2  PHQ-9 Score 3 0 5  Difficult doing work/chores Not difficult at all Not difficult at all Somewhat difficult  Some recent data might be hidden     Social History   Tobacco Use  Smoking Status Former    Packs/day: 3.00   Years: 34.00   Pack years: 102.00   Types: Cigarettes   Quit date: 05/29/1995   Years since quitting: 25.7  Smokeless Tobacco Never   BP Readings from Last 3 Encounters:  02/25/21 122/80  02/12/21 (!) 148/67  10/14/20 (!) 154/80   Pulse Readings from Last 3 Encounters:  02/25/21 (!) 57  10/14/20 78  08/12/20 67   Wt Readings from Last 3 Encounters:  02/25/21 124 lb 12.8 oz (56.6 kg)  02/12/21 125 lb (56.7 kg)  10/14/20 123 lb 4 oz (55.9 kg)   BMI Readings from Last 3 Encounters:  02/25/21 21.42 kg/m  02/12/21 22.50  kg/m  10/14/20 22.18 kg/m    Assessment/Interventions: Review of patient past medical history, allergies, medications, health status, including review of consultants reports, laboratory and other test data, was performed as part of comprehensive evaluation and provision of chronic care management services.   SDOH:  (Social Determinants of Health) assessments and interventions performed: Yes  SDOH Screenings   Alcohol Screen: Low Risk    Last Alcohol Screening Score (AUDIT): 1  Depression (PHQ2-9): Low Risk    PHQ-2 Score: 3  Financial Resource Strain: Low Risk    Difficulty of Paying Living Expenses: Not hard at all  Food Insecurity: No Food Insecurity   Worried About Charity fundraiser in the Last Year: Never true   Ran Out of Food in the Last Year: Never true  Housing: Low Risk    Last Housing Risk Score: 0  Physical Activity: Inactive   Days of Exercise per Week: 0 days   Minutes of Exercise per Session: 0 min  Social Connections: Not on file  Stress: No Stress Concern Present   Feeling of Stress : Not at all  Tobacco Use: Medium Risk   Smoking Tobacco Use: Former   Smokeless Tobacco Use: Never  Transportation Needs: No Data processing manager (Medical): No   Lack of Transportation (Non-Medical): No    CCM Care Plan  Allergies  Allergen Reactions   Keflex [Cephalexin] Shortness Of Breath    Penicillins     REACTION: hives    Medications Reviewed Today     Reviewed by Charlton Haws, [facility] (Pharmacist) on 03/04/21 at 1508  Med List Status: <None>   Medication Order Taking? Sig Documenting Provider Last Dose Status Informant  albuterol (PROVENTIL) (2.5 MG/3ML) 0.083% nebulizer solution [id number] Yes USE ONE VIAL IN NEBULIZER DAILY AS NEEDED FOR RESCUE Flora Lipps, MD Taking Active   BROVANA 15 MCG/2ML NEBU [id number] Yes USE 1 VIAL  IN  NEBULIZER TWICE  DAILY - (Morning And Evening) Kasa, Kurian, MD Taking Active   budesonide (PULMICORT) 0.5 MG/2ML nebulizer solution [id number] Yes INHALE 1 VIAL VIA NEBULIZER TWICE DAILY Flora Lipps, MD Taking Active   buPROPion [facility] SR) 150 MG 12 hr tablet [id number] Yes TAKE 1 TABLET BY MOUTH TWICE A Velora Heckler, MD Taking Active   cholecalciferol (VITAMIN D) 1000 units tablet [id number] Yes Take 2,000 Units by mouth daily. [provider] Taking Active   conjugated estrogens (PREMARIN) vaginal cream [id number] Yes Place 1 Applicatorful vaginally daily. Apply 0.53m (pea-sized amount)  just inside the vaginal introitus with a finger-tip on  Monday, Wednesday and Friday nights.  Patient taking differently: Place 1 Applicatorful vaginally as needed. Apply 0.557m(pea-sized amount)  just inside the vaginal introitus with a finger-tip on  Monday, Wednesday and Friday nights.   McNori RiisPA-C Taking Active   CRANBERRY PO [id number] Take 2 tablets by mouth every morning.  [provider] Taking Active Self  ezetimibe (ZETIA) 10 MG tablet [id number] TAKE 1 TABLET BY MOUTH EVERY DAY GuRia BushMD Taking Active   losartan (COZAAR) 100 MG tablet [id number] TAKE 1 TABLET BY MOUTH EVERY DAY GuRia BushMD Taking Active   metoprolol tartrate (LOPRESSOR) 25 MG tablet [id number] TAKE 1 TABLET BY MOUTH TWICE A DAVelora HecklerMD Taking Active   mirtazapine (REMERON) 15 MG tablet  [id number] TAKE ONE TABLET BY MOUTH AT BEDTIME GuRia BushMD Taking Active   Multiple Vitamins-Minerals (PRESERVISION AREDS 2) CAPS [id number]  Yes Take 2 capsules by mouth daily. Ria Bush, MD Taking Active   pravastatin (PRAVACHOL) 80 MG tablet [id number] Yes TAKE 1 TABLET BY MOUTH EVERYDAY AT BEDTIME Ria Bush, MD Taking Active   predniSONE (DELTASONE) 10 MG tablet [id number] Yes Take 4 tablets (40 mg total) by mouth daily with breakfast. 10 days  Patient taking differently: Take 10 mg by mouth daily with breakfast.   Flora Lipps, MD Taking Active   PROAIR HFA 108 [phone] Base) MCG/ACT inhaler [id number] Yes TAKE 2 PUFFS BY MOUTH EVERY 4 HOURS AS NEEDED Ria Bush, MD Taking Active   tiotropium [facility]) 18 MCG inhalation capsule [id number] Yes Place 1 capsule (18 mcg total) into inhaler and inhale daily. Ria Bush, MD Taking Active             Patient Active Problem List   Diagnosis Date Noted   Pain in metatarsus of left foot 08/12/2020   Right shoulder pain 06/17/2020   Abdominal aortic atherosclerosis (B and E) 08/25/2019   Right iliac artery stenosis (HCC) 08/25/2019   GERD (gastroesophageal reflux disease) 04/03/2019   Chronic diarrhea 03/05/2019   PAD (peripheral artery disease) ([place]) 01/05/2019   Epidermal cyst 02/01/2018   Neck pain 02/01/2018   Weight loss, unintentional 12/29/2017   Hoarseness, persistent 12/29/2017   Dysphagia 11/17/2017   Orthostatic syncope 09/21/2016   Trigeminal neuralgia of right side of face 11/18/2015   COPD exacerbation ([place]) 07/29/2015   CAD (coronary artery disease) 03/04/2015   Health maintenance examination 03/04/2015   MAI (mycobacterium avium-intracellulare) ([place]) 10/23/2014   Hx of colonic polyps 04/23/2014   Advanced care planning/counseling discussion 02/18/2014   Advance directive discussed with patient 02/18/2014   Allergic rhinitis 02/05/2014   MRSA colonization 02/05/2014   Urge incontinence of  urine 03/23/2013   Recurrent UTI 07/27/2012   Chronic respiratory failure with hypoxia ([place]) 04/27/2012   Medicare annual wellness visit, subsequent 09/10/2011   Dyslipidemia 09/01/2011   Osteopenia 08/23/2011   Chronic fatigue 06/19/2008   GOLD 4 COPD with emphysema 02/01/2008   ANXIETY 11/21/2006   MDD (major depressive disorder), recurrent episode, moderate ([place]) 11/21/2006   Essential hypertension 11/21/2006   Hyperglycemia 11/21/2006    Immunization History  Administered Date(s) Administered   Fluad Quad(high Dose 65+) 03/05/2019   Influenza Split 03/27/2012   Influenza Whole 02/21/2006, 02/21/2008, 02/22/2011   Influenza,inj,Quad PF,6+ Mos 02/16/2013, 02/18/2014, 03/04/2015, 01/21/2016, 06/15/2016, 02/01/2018   Influenza-Unspecified 04/10/2020   Moderna Sars-Covid-2 Vaccination 07/10/2019, 08/07/2019, 04/10/2020   Pneumococcal Conjugate-13 02/18/2014   Pneumococcal Polysaccharide-23 02/10/2009   Td 05/25/1998, 09/18/2008   Zoster Recombinat (Shingrix) 04/03/2019   Zoster, Live 11/13/2012    Conditions to be addressed/monitored:  Hypertension, Hyperlipidemia, Coronary Artery Disease, COPD, Depression, Anxiety, and Osteopenia  Care Plan : [place]  Updates made by Charlton Haws, [place] since 03/04/2021 12:00 AM     Problem: Hypertension, Hyperlipidemia, Coronary Artery Disease, COPD, Depression, Anxiety, and Osteopenia   Priority: High     Long-Range Goal: Disease management   Start Date: 03/04/2021  Expected End Date: 03/04/2022  This Visit's Progress: On track  Priority: High  Note:   Current Barriers:  Unable to independently monitor therapeutic efficacy Suboptimal therapeutic regimen for osteopenia  Pharmacist Clinical Goal(s):  Patient will achieve adherence to monitoring guidelines and medication adherence to achieve therapeutic efficacy adhere to plan to optimize therapeutic regimen for osteopenia as evidenced by report of adherence to  recommended medication management changes through collaboration with PharmD and provider.   Interventions: 1:1  collaboration with Ria Bush, MD regarding development and update of comprehensive plan of care as evidenced by provider attestation and co-signature Inter-disciplinary care team collaboration (see longitudinal plan of care) Comprehensive medication review performed; medication list updated in electronic medical record  Hypertension (BP goal <140/90) -Controlled - BP is at goal during recent office visit; however pt did recently start daily prednisone and has not been checking BP -Current home readings: n/a -Current treatment: Losartan 100 mg daily AM Metoprolol tartrate 25 mg BID -Medications previously tried: none  -Current exercise habits: limited due to COPD -Denies hypotensive/hypertensive symptoms -Educated on BP goals and benefits of medications for prevention of heart attack, stroke and kidney damage; Importance of home blood pressure monitoring; -Counseled on prednisone risk for increasing BP -Advised to monitor BP at home daily -Recommended to continue current medication  Hyperlipidemia / CAD (LDL goal < 70) -Controlled - LDL is at goal; pt endorses compliance with statin but she has not been taking ezetimibe for a few months, it appears it was not transferred to her new pharmacy with the rest of her medications -Hx CAD, PAD, aortic atherosclerosis -Current treatment: Pravastatin 80 mg daily Ezetimibe 10 mg daily -Medications previously tried: none  -Educated on Cholesterol goals; Benefits of statin for ASCVD risk reduction; -[facility] [place] to continue current medication  COPD (Goal: control symptoms and prevent exacerbations) -Controlled - pt reports symptoms are stable; she follows with pulmonary and was just started on daily prednisone 10 mg last week; she reports improvement in energy levels  and feeling better overall -Gold Grade: Gold 4 (FEV1<40%) -Current COPD Classification:  D (high sx, >/=2 exacerbations/yr) -MMRC/CAT score: not on file -Pulmonary function testing: December 2013 simple spirometry ratio 39%, FEV1 0.82 L (30% predicted) -Exacerbations requiring treatment in last 6 months: 2 -Current treatment  Spriva Handihaler 1 puff daily Budesonide 0.5 mg via nebulizer BID Brovana (arformoterol) 15 mcg via nebuilzer BID Albuterol 0.083% via nebulizer PRN Proair PRN Oxygen 4 L continuously Prednisone 10 mg daily -Patient reports consistent use of maintenance inhaler -Frequency of rescue inhaler use: albuterol nebuilizer 2-3 times daily; albuterol HFA periodically when outside of home -Counseled on Proper inhaler technique;Benefits of consistent maintenance inhaler use -Educated on risks of chronic steroid use - elevated BG, BG, weight gain, and bone loss; advised pt to monitor BP at home regularly -Recommended to continue current medication  Depression/Anxiety (Goal: manage symptoms) -Not ideally controlled - pt reports low mood a lot of the time; she reports sleeping well overall; she engages in social activities/lunch with the other residents of her ALF on a daily basis and speaks with her children by phone almost every day; she lost her husband ~3 years ago which was very hard on her and triggered a 50 lbs wt loss over 3 years due to appetite loss;  -Current treatment: Bupropion SR 150 mg BID Mirtazapine 15 mg HS -Medications previously tried/failed: sertraline (diarrhea) -PHQ9: 3 (05/2020) -GAD7: not on file -Connected with PCP for mental health support -Educated on Benefits of medication for symptom control;Benefits of cognitive-behavioral therapy with or without medication; pt declined to start therapy today -Recommended to continue current medication   Osteopenia (Goal prevent fractures) -Not ideally controlled - pt is not taking calcium and is not able to  exercise due to COPD; she had a fall 2 weeks ago and saw an orthopedist who found no fractures; she also recently started daily prednisone -Last DEXA Scan: 09/2018  T-Score  femoral neck: -1.7  T-Score lumbar spine: -0.6  10-year probability of major osteoporotic fracture: 29.4%  10-year probability of hip fracture: 15.4% -Patient is a candidate for pharmacologic treatment due to T-Score -1.0 to -2.5 and 10-year risk of major osteoporotic fracture > 20% and T-Score -1.0 to -2.5 and 10-year risk of hip fracture > 3% -Current treatment  Vitamin D 2000 IU daily -Recommend 1200 mg of calcium daily from dietary and supplemental sources.  -Pt is at high risk for fracture given high FRAX score, history of falls, and recent initiation of chronic prednisone, pt would benefit from an antiresorptive agent to prevent fractures -Recommend alendronate 70 mg once weekly and to start a calcium supplement  Health Maintenance -Vaccine gaps: Flu (03/04/21), Covid booster (03/04/21), Shingrix #2 -Current therapy:  Cranberry Premarin vaginal cream Preservision Areds 2  Balance of Nature supplement -Educated on Herbal supplement research is limited and benefits usually cannot be proven; Cost vs benefit of each product must be carefully weighed by individual consumer -Advised Balance of Petra Kuba products' claims are not based in scientific research; advised to stop it if cost is too high  Patient Goals/Self-Care Activities Patient will:  - take medications as prescribed -focus on medication adherence by pill box -check blood pressure daily -Start alendronate 70 mg once a week; take with water 30 min before food or other medications      Medication Assistance: None required.  Patient affirms current coverage meets needs.  Compliance/Adherence/Medication fill history: Care Gaps: Hep C screening Mammogram (04/12/20)  Star-Rating Drugs: Losartan - LF 12/25/20 x 90 ds Pravastatin - LF 12/19/20 x 90  ds  Patient's preferred pharmacy is:  [facility], [place] [address] Phone: [phone] Fax: [phone]  Uses pill box? Yes Pt endorses 100% compliance  We discussed: Current pharmacy is preferred with insurance plan and patient is satisfied with pharmacy services; [facility] delivers her prescriptions Patient decided to: Continue current medication management strategy  Care Plan and Follow Up Patient Decision:  Patient agrees to Care Plan and Follow-up.  Plan: Telephone follow up appointment with care management team member scheduled for:  6 months  [name], PharmD, [place], CPP Clinical Pharmacist [facility] [phone]

## 2021-03-04 NOTE — Patient Instructions (Signed)
Visit Information  Phone number for Pharmacist: [phone]  Thank you for meeting with me to discuss your medications! I look forward to working with you to achieve your health care goals. Below is a summary of what we talked about during the visit:   Goals Addressed             This Visit's Progress    Prevent Falls and Broken Bones-Osteoporosis       Timeframe:  Long-Range Goal Priority:  High Start Date:      03/04/21                       Expected End Date:      03/04/22                 Follow Up Date April 2023   - always use handrails on the stairs - always wear shoes or slippers with non-slip sole - keep a flashlight by the bed - make an emergency alert plan in case I fall - use a nightlight in the bathroom - wear low heeled or flat shoes with non-skid soles    Why is this important?   When you fall, there are 3 things that control if a bone breaks or not.  These are the fall itself, how hard and the direction that you fall and how fragile your bones are.  Preventing falls is very important for you because of fragile bones.     Notes:         Care Plan : [place]  Updates made by Charlton Haws, RPH since 03/04/2021 12:00 AM     Problem: Hypertension, Hyperlipidemia, Coronary Artery Disease, COPD, Depression, Anxiety, and Osteopenia   Priority: High     Long-Range Goal: Disease management   Start Date: 03/04/2021  Expected End Date: 03/04/2022  This Visit's Progress: On track  Priority: High  Note:   Current Barriers:  Unable to independently monitor therapeutic efficacy Suboptimal therapeutic regimen for osteopenia  Pharmacist Clinical Goal(s):  Patient will achieve adherence to monitoring guidelines and medication adherence to achieve therapeutic efficacy adhere to plan to optimize therapeutic regimen for osteopenia as evidenced by report of adherence to recommended medication management changes through collaboration with  PharmD and provider.   Interventions: 1:1 collaboration with Ria Bush, MD regarding development and update of comprehensive plan of care as evidenced by provider attestation and co-signature Inter-disciplinary care team collaboration (see longitudinal plan of care) Comprehensive medication review performed; medication list updated in electronic medical record  Hypertension (BP goal <140/90) -Controlled - BP is at goal during recent office visit; however pt did recently start daily prednisone and has not been checking BP -Current home readings: n/a -Current treatment: Losartan 100 mg daily AM Metoprolol tartrate 25 mg BID -Medications previously tried: none  -Current exercise habits: limited due to COPD -Denies hypotensive/hypertensive symptoms -Educated on BP goals and benefits of medications for prevention of heart attack, stroke and kidney damage; Importance of home blood pressure monitoring; -Counseled on prednisone risk for increasing BP -Advised to monitor BP at home daily -Recommended to continue current medication  Hyperlipidemia / CAD (LDL goal < 70) -Controlled - LDL is at goal; pt endorses compliance with statin but she has not been taking ezetimibe for a few months, it appears it was not transferred to her new pharmacy with the rest of her medications -Hx CAD, PAD, aortic atherosclerosis -Current treatment: Pravastatin 80 mg daily Ezetimibe 10 mg  daily -Medications previously tried: none  -Educated on Cholesterol goals; Benefits of statin for ASCVD risk reduction; -Coordinated transfer of ezetimibe from [facility] to [place] to continue current medication  COPD (Goal: control symptoms and prevent exacerbations) -Controlled - pt reports symptoms are stable; she follows with pulmonary and was just started on daily prednisone 10 mg last week; she reports improvement in energy levels and feeling better overall -Gold Grade: Gold 4 (FEV1<40%) -Current  COPD Classification:  D (high sx, >/=2 exacerbations/yr) -MMRC/CAT score: not on file -Pulmonary function testing: December 2013 simple spirometry ratio 39%, FEV1 0.82 L (30% predicted) -Exacerbations requiring treatment in last 6 months: 2 -Current treatment  Spriva Handihaler 1 puff daily Budesonide 0.5 mg via nebulizer BID Brovana (arformoterol) 15 mcg via nebuilzer BID Albuterol 0.083% via nebulizer PRN Proair PRN Oxygen 4 L continuously Prednisone 10 mg daily -Patient reports consistent use of maintenance inhaler -Frequency of rescue inhaler use: albuterol nebuilizer 2-3 times daily; albuterol HFA periodically when outside of home -Counseled on Proper inhaler technique;Benefits of consistent maintenance inhaler use -Educated on risks of chronic steroid use - elevated BG, BG, weight gain, and bone loss; advised pt to monitor BP at home regularly -Recommended to continue current medication  Depression/Anxiety (Goal: manage symptoms) -Not ideally controlled - pt reports low mood a lot of the time; she reports sleeping well overall; she engages in social activities/lunch with the other residents of her ALF on a daily basis and speaks with her children by phone almost every day; she lost her husband ~3 years ago which was very hard on her and triggered a 50 lbs wt loss over 3 years due to appetite loss;  -Current treatment: Bupropion SR 150 mg BID Mirtazapine 15 mg HS -Medications previously tried/failed: sertraline (diarrhea) -PHQ9: 3 (05/2020) -GAD7: not on file -Connected with PCP for mental health support -Educated on Benefits of medication for symptom control;Benefits of cognitive-behavioral therapy with or without medication; pt declined to start therapy today -Recommended to continue current medication   Osteopenia (Goal prevent fractures) -Not ideally controlled - pt is not taking calcium and is not able to exercise due to COPD; she had a fall 2 weeks ago and saw an orthopedist  who found no fractures; she also recently started daily prednisone -Last DEXA Scan: 09/2018  T-Score femoral neck: -1.7  T-Score lumbar spine: -0.6  10-year probability of major osteoporotic fracture: 29.4%  10-year probability of hip fracture: 15.4% -Patient is a candidate for pharmacologic treatment due to T-Score -1.0 to -2.5 and 10-year risk of major osteoporotic fracture > 20% and T-Score -1.0 to -2.5 and 10-year risk of hip fracture > 3% -Current treatment  Vitamin D 2000 IU daily -Recommend 1200 mg of calcium daily from dietary and supplemental sources.  -Pt is at high risk for fracture given high FRAX score, history of falls, and recent initiation of chronic prednisone, pt would benefit from an antiresorptive agent to prevent fractures -Recommend alendronate 70 mg once weekly and to start a calcium supplement  Health Maintenance -Vaccine gaps: Flu (03/04/21), Covid booster (03/04/21), Shingrix #2 -Current therapy:  Cranberry Premarin vaginal cream Preservision Areds 2  Balance of Nature supplement -Educated on Herbal supplement research is limited and benefits usually cannot be proven; Cost vs benefit of each product must be carefully weighed by individual consumer -Advised Balance of Petra Kuba products' claims are not based in scientific research; advised to stop it if cost is too high  Patient Goals/Self-Care Activities Patient will:  -  take medications as prescribed -focus on medication adherence by pill box -check blood pressure daily -Start alendronate 70 mg once a week; take with water 30 min before food or other medications      Ms. Marucci was given information about Chronic Care Management services today including:  CCM service includes personalized support from designated clinical staff supervised by her physician, including individualized plan of care and coordination with other care providers 24/7 contact phone numbers for assistance for urgent and routine care  needs. Standard insurance, coinsurance, copays and deductibles apply for chronic care management only during months in which we provide at least 20 minutes of these services. Most insurances cover these services at 100%, however patients may be responsible for any copay, coinsurance and/or deductible if applicable. This service may help you avoid the need for more expensive face-to-face services. Only one practitioner may furnish and bill the service in a calendar month. The patient may stop CCM services at any time (effective at the end of the month) by phone call to the office staff.  Patient agreed to services and verbal consent obtained.   Patient verbalizes understanding of instructions provided today and agrees to view in [place].  Telephone follow up appointment with pharmacy team member scheduled for: 6 months  [name], PharmD, [place], CPP Clinical Pharmacist [facility] [phone]

## 2021-03-05 ENCOUNTER — Telehealth: Payer: Self-pay | Admitting: Family Medicine

## 2021-03-05 DIAGNOSIS — M85852 Other specified disorders of bone density and structure, left thigh: Secondary | ICD-10-CM

## 2021-03-05 MED ORDER — ALENDRONATE SODIUM 70 MG PO TABS: 70.0000 mg | ORAL_TABLET | ORAL | 11 refills | Status: DC

## 2021-03-05 NOTE — Addendum Note (Signed)
Addended by: Charlton Haws on: 03/05/2021 12:16 PM   Modules accepted: Orders

## 2021-03-05 NOTE — Telephone Encounter (Signed)
Reviewed recent CCM note - to clarify, was patient willing to start fosamax?  If so, would be ok with starting 70mg  weekly dose.  Lab Results  Component Value Date   CREATININE 0.81 06/11/2020   BUN 9 06/11/2020   NA 141 06/11/2020   K 4.5 06/11/2020   CL 98 06/11/2020   CO2 40 (H) 06/11/2020

## 2021-03-05 NOTE — Telephone Encounter (Signed)
Yes, patient was ok with starting medication.  Ordered alendronate 70 mg weekly, per below.

## 2021-03-17 ENCOUNTER — Other Ambulatory Visit: Payer: Self-pay | Admitting: Family Medicine

## 2021-03-20 ENCOUNTER — Ambulatory Visit: Payer: PPO | Admitting: Family Medicine

## 2021-03-23 DIAGNOSIS — J439 Emphysema, unspecified: Secondary | ICD-10-CM | POA: Diagnosis not present

## 2021-03-23 DIAGNOSIS — I10 Essential (primary) hypertension: Secondary | ICD-10-CM

## 2021-03-23 DIAGNOSIS — F331 Major depressive disorder, recurrent, moderate: Secondary | ICD-10-CM

## 2021-03-23 DIAGNOSIS — E785 Hyperlipidemia, unspecified: Secondary | ICD-10-CM

## 2021-03-24 ENCOUNTER — Telehealth: Payer: Self-pay | Admitting: Internal Medicine

## 2021-03-24 ENCOUNTER — Other Ambulatory Visit: Payer: Self-pay | Admitting: Internal Medicine

## 2021-03-24 MED ORDER — ARFORMOTEROL TARTRATE 15 MCG/2ML IN NEBU: INHALATION_SOLUTION | RESPIRATORY_TRACT | 6 refills | Status: DC

## 2021-03-24 NOTE — Telephone Encounter (Signed)
Spoke to patient and confirmed that she would like to get [facility]ana [facility]. Spoke to [facility] with [facility] and requested that she cancel Rx. [facility]. Ebony Hail with [facility] is aware. Nothing further needed at this time.

## 2021-03-25 ENCOUNTER — Other Ambulatory Visit: Payer: Self-pay | Admitting: Internal Medicine

## 2021-03-25 DIAGNOSIS — J432 Centrilobular emphysema: Secondary | ICD-10-CM | POA: Diagnosis not present

## 2021-03-31 ENCOUNTER — Telehealth: Payer: Self-pay

## 2021-03-31 NOTE — Progress Notes (Signed)
Chronic Care Management Pharmacy Assistant   Name: JERNIE SCHUTT  MRN: [id number] DOB: 1940-08-09  Reason for Encounter: CCM (Hypertension Disease State)   Recent office visits:  03/05/2021 - Ria Bush, Telephone message: Start: alendronate (FOSAMAX) 70 MG tablet  Recent consult visits:  None since last CCM contact  Hospital visits:  None in previous 6 months.  Medications: Outpatient Encounter Medications as of 03/31/2021  Medication Sig   albuterol (PROVENTIL) (2.5 MG/3ML) 0.083% nebulizer solution USE ONE VIAL IN NEBULIZER DAILY AS NEEDED FOR RESCUE   alendronate (FOSAMAX) 70 MG tablet Take 1 tablet (70 mg total) by mouth once a week. Take with a full glass of water on an empty stomach.   arformoterol (BROVANA) 15 MCG/2ML NEBU USE 1 VIAL  IN  NEBULIZER TWICE  DAILY - (Morning And Evening)   budesonide (PULMICORT) 0.5 MG/2ML nebulizer solution INHALE 1 VIAL VIA NEBULIZER TWICE DAILY   buPROPion (WELLBUTRIN SR) 150 MG 12 hr tablet TAKE 1 TABLET BY MOUTH TWICE A DAY   cholecalciferol (VITAMIN D) 1000 units tablet Take 2,000 Units by mouth daily.   conjugated estrogens (PREMARIN) vaginal cream Place 1 Applicatorful vaginally daily. Apply 0.5mg  (pea-sized amount)  just inside the vaginal introitus with a finger-tip on  Monday, Wednesday and Friday nights. (Patient taking differently: Place 1 Applicatorful vaginally as needed. Apply 0.5mg  (pea-sized amount)  just inside the vaginal introitus with a finger-tip on  Monday, Wednesday and Friday nights.)   CRANBERRY PO Take 2 tablets by mouth every morning.    ezetimibe (ZETIA) 10 MG tablet TAKE 1 TABLET BY MOUTH EVERY DAY   losartan (COZAAR) 100 MG tablet TAKE 1 TABLET BY MOUTH DAILY   metoprolol tartrate (LOPRESSOR) 25 MG tablet TAKE 1 TABLET BY MOUTH TWICE A DAY   mirtazapine (REMERON) 15 MG tablet TAKE ONE TABLET BY MOUTH AT BEDTIME   Multiple Vitamins-Minerals (PRESERVISION AREDS 2) CAPS Take 2 capsules by mouth daily.    pravastatin (PRAVACHOL) 80 MG tablet TAKE 1 TABLET BY MOUTH EVERYDAY AT BEDTIME   predniSONE (DELTASONE) 10 MG tablet Take 4 tablets (40 mg total) by mouth daily with breakfast. 10 days (Patient taking differently: Take 10 mg by mouth daily with breakfast.)   PROAIR HFA 108 (90 Base) MCG/ACT inhaler TAKE 2 PUFFS BY MOUTH EVERY 4 HOURS AS NEEDED   tiotropium (SPIRIVA) 18 MCG inhalation capsule Place 1 capsule (18 mcg total) into inhaler and inhale daily.   No facility-administered encounter medications on file as of 03/31/2021.   Recent Office Vitals: BP Readings from Last 3 Encounters:  02/25/21 122/80  02/12/21 (!) 148/67  10/14/20 (!) 154/80   Pulse Readings from Last 3 Encounters:  02/25/21 (!) 57  10/14/20 78  08/12/20 67    Wt Readings from Last 3 Encounters:  02/25/21 124 lb 12.8 oz (56.6 kg)  02/12/21 125 lb (56.7 kg)  10/14/20 123 lb 4 oz (55.9 kg)     Kidney Function Lab Results  Component Value Date/Time   CREATININE 0.81 06/11/2020 11:30 AM   CREATININE 1.03 05/19/2020 05:16 PM   CREATININE 0.71 07/24/2012 01:13 PM   GFR 69.14 06/11/2020 11:30 AM   GFRNONAA >60 01/24/2020 06:05 PM   GFRNONAA >60 07/24/2012 01:13 PM   GFRAA >60 01/24/2020 06:05 PM   GFRAA >60 07/24/2012 01:13 PM    BMP Latest Ref Rng & Units 06/11/2020 05/19/2020 01/31/2020  Glucose 70 - 99 mg/dL 102(H) 86 111(H)  BUN 6 - 23 mg/dL 9 11 12  Creatinine 0.40 - 1.20 mg/dL 0.81 1.03 0.86  Sodium 135 - 145 mEq/L 141 141 140  Potassium 3.5 - 5.1 mEq/L 4.5 4.1 4.5  Chloride 96 - 112 mEq/L 98 101 98  CO2 19 - 32 mEq/L 40(H) 37(H) 37(H)  Calcium 8.4 - 10.5 mg/dL 9.4 8.9 9.5   Contacted patient on 04/03/2021 to discuss hypertension disease state  Spoke with patient; she stated she has a UTI (she has had so many over the years she knows exactly what it is). Symptoms include: Pain when urinating, odor and frequency. Patient got up 4-5 times last night in the middle of the night to urinate (a lot comes out  when she does). Duration: 1 month Treatment; none other than lots of water. Patient can not drive and the [place] location is too far away.   Current antihypertensive regimen:  Losartan 100 mg daily AM Metoprolol tartrate 25 mg BID  Patient verbally confirms she is taking the above medications as directed. Yes  How often are you checking your Blood Pressure? Patient stated she is not checking her blood pressure at home. I asked patient to take readings starting today and continue every day until next Friday when I will call her back. Patient agreed.   Wrist or arm cuff: Wrist Caffeine intake: Coffee - 4 - 5 cups per day Salt intake: Patient watches her salt.  OTC medications including pseudoephedrine or NSAIDs? Patient takes Mucinex when she needs it.   What recent interventions/DTPs have been made by any provider to improve Blood Pressure control since last CPP Visit: Advised to monitor BP at home daily and recommended to continue current medication.  Any recent hospitalizations or ED visits since last visit with CPP? No  What diet changes have been made to improve Blood Pressure Control?  Patient watches her salt intake   What exercise is being done to improve your Blood Pressure Control?  Patient has COPD so exercise is limited.   Adherence Review: Is the patient currently on ACE/ARB medication? Yes Does the patient have >5 day gap between last estimated fill dates? Yes - Pravastatin  Star Rating Drugs:  Medication:  Last Fill: Day Supply Losartan 100 mg 03/17/2021 90   Pravastatin 80 mg 12/19/2020 90   Care Gaps: Annual wellness visit in last year? Yes 06/17/2020 Most Recent BP reading: 122/80 on 02/25/2021  PCP appointment on 06/11/2021 for AWV  Debbora Dus, CPP notified  [name], [place] [phone]  Time Spent: 60 Minutes

## 2021-04-03 ENCOUNTER — Telehealth: Payer: Self-pay

## 2021-04-03 DIAGNOSIS — N390 Urinary tract infection, site not specified: Secondary | ICD-10-CM

## 2021-04-03 MED ORDER — SULFAMETHOXAZOLE-TRIMETHOPRIM 800-160 MG PO TABS: 1.0000 | ORAL_TABLET | Freq: Two times a day (BID) | ORAL | 0 refills | Status: DC

## 2021-04-03 NOTE — Telephone Encounter (Signed)
Spoke to pt and relayed Dr. Synthia Innocent message. Rx instructions given and pharmacy verified. Pt very thankful for the Rx.

## 2021-04-03 NOTE — Telephone Encounter (Signed)
I called pt to try and triage pt and she said this is not new; this time pt has had symptoms for 1 month; pt does not want to go anywhere to be seen or take a urine specimen; pt said she knows what she has and wants [facility]. Pt does not want to be triaged because she has already spoken to someone about this (? Debbora Dus [facility]). Pt was very nice but she again said I just need med sent to pharmacy. Pt request cb after reviewed by Dr Darnell Level. Sending note to Dr Darnell Level and Lattie Haw CMA and will teams LIsa.

## 2021-04-03 NOTE — Telephone Encounter (Signed)
I've sent abx to her pharmacy - bactrim 5d course given keflex/PCN allergy.  When she has these symptoms please let us know right away not wait 1 month.  If any recurrence of symptoms she will need to see me in person. Let us know too if not improved with this antibiotic.

## 2021-04-03 NOTE — Telephone Encounter (Signed)
During patient call 04/03/21 3:30 PM, patient reports she has a UTI (she has had so many over the years she knows exactly what it is). Symptoms include: Pain when urinating, odor and frequency. Patient got up 4-5 times last night in the middle of the night to urinate. She states this has been ongoing for 1 month despite increasing her water intake. She has not taken any antibiotics. Patient states she cannot drive and the [place] location is too far away. Will forward this concern to triage.

## 2021-04-06 NOTE — Telephone Encounter (Signed)
Noted  

## 2021-04-06 NOTE — Progress Notes (Addendum)
04/06/2021 - Patient left me a voicemail on Saturday (11/12) stating she did not get the medication Dr. Danise Mina prescribed for her as she did not answer the door. Called patient back; she did get the prescription and she is feeling much better. I advised patient to not wait so long and to call if she is feeling bad. Patient agreed. Told patient I would call her back Friday for BP readings.   Debbora Dus, CPP notified  [name], [place] Clinical Pharmacy Assistant [phone]  Time Spent: 12 Minutes

## 2021-04-10 NOTE — Progress Notes (Addendum)
Called patient for blood pressure readings: Patient stated the following:  Date  BP Reading  Pulse      11/18  162/92   79 11/17  124/71   62 11/16  148/73   64 11/15  150/58   65 11/14  152/74   61 11/13  145/69   63 11/12  150/70   [place], CPP notified  [name], [place] Pharmacy Assistant [phone]  Time Spent:  10 Minutes

## 2021-04-23 ENCOUNTER — Other Ambulatory Visit: Payer: Self-pay | Admitting: Family Medicine

## 2021-04-23 NOTE — Telephone Encounter (Signed)
E-scribed refill.  Pt has wellness on 06/11/18.  Plz schedule lab and cpe visits after 06/11/21.

## 2021-04-24 NOTE — Telephone Encounter (Signed)
Pt scheduled a cpe/lab in march2023

## 2021-04-27 ENCOUNTER — Other Ambulatory Visit: Payer: Self-pay

## 2021-04-27 ENCOUNTER — Ambulatory Visit (INDEPENDENT_AMBULATORY_CARE_PROVIDER_SITE_OTHER): Payer: PPO | Admitting: Pharmacist

## 2021-04-27 DIAGNOSIS — F331 Major depressive disorder, recurrent, moderate: Secondary | ICD-10-CM

## 2021-04-27 DIAGNOSIS — E785 Hyperlipidemia, unspecified: Secondary | ICD-10-CM

## 2021-04-27 DIAGNOSIS — I700 Atherosclerosis of aorta: Secondary | ICD-10-CM

## 2021-04-27 DIAGNOSIS — M85852 Other specified disorders of bone density and structure, left thigh: Secondary | ICD-10-CM

## 2021-04-27 DIAGNOSIS — I10 Essential (primary) hypertension: Secondary | ICD-10-CM

## 2021-04-27 DIAGNOSIS — J9611 Chronic respiratory failure with hypoxia: Secondary | ICD-10-CM

## 2021-04-27 MED ORDER — EZETIMIBE 10 MG PO TABS: 10.0000 mg | ORAL_TABLET | Freq: Every day | ORAL | 0 refills | Status: DC

## 2021-04-27 NOTE — Progress Notes (Signed)
Chronic Care Management Pharmacy Note  04/27/2021 Name:  AMYRI FRENZ MRN:  [id number] DOB:  Dec 16, 1940  Summary: -Patient's home BP has been elevated since starting daily prednisone (avg 147/72, range 124/71-162/92); she also reports new swelling in her feet and gradual worsening shortness of breath overall - swelling could be side effect of prednisone or sign of heart failure, pt needs to see provider -Pt reports worsening depression, overall low mood since switching sertraline to mirtazapine; she is sleeping well; she declines therapy -Pt endorses compliance with new Rx alendronate 70 mg weekly  Recommendations/Changes made from today's visit: -Scheduled PCP visit 12/6 @ 3pm to address swelling, SOB -Advised pt to try mirtazapine 15 mg - 2 tablets at bedtime; if symptoms improve will order 30 mg tablet   Subjective: GHISLAINE HARCUM is an 80 y.o. year old female who is a primary patient of Ria Bush, MD.  The CCM team was consulted for assistance with disease management and care coordination needs.    Engaged with patient by telephone for follow up visit in response to provider referral for pharmacy case management and/or care coordination services.   Consent to Services:  The patient was given information about Chronic Care Management services, agreed to services, and gave verbal consent prior to initiation of services.  Please see initial visit note for detailed documentation.   Patient Care Team: Ria Bush, MD as PCP - General (Family Medicine) Rockey Situ Kathlene November, MD as Consulting Physician (Cardiology) Charlton Haws, [facility] as Pharmacist (Pharmacist)   Patient lives in assisted living with her own apartment. Her husband died 3 years ago. She has lunch every day with other residents. She does cook for herself sometimes. Wakes up 9:30-10am (daughter calls every day); she takes a nap every afternoon. She likes to read, reads in bed about 7:30-8pm,  typically goes to sleep around 11-11:30pm. She wakes up about once per night to use bathroom.  Recent office visits: 12/21/20 PCP pt message - rx'd prednisone taper for COPD exacerbation. 10/14/20 Danise Mina (PCP)- SOB. Covid negative. Start prednisone 20 mg. D/c Pancrelipase 36000 units.  Recent consult visits: 02/25/21 Dr Mortimer Fries (pulmonary): f/u COPD; pt wants to start daily prednisone. Rx'd prednisone 10 mg daily  02/12/21 [facility] (Urology) - Microscopic hematuria, hx recurrent UTI. No med changes. Negative urinalysis.  Hospital visits: None in previous 6 months   Objective:  Lab Results  Component Value Date   CREATININE 0.81 06/11/2020   BUN 9 06/11/2020   GFR 69.14 06/11/2020   GFRNONAA >60 01/24/2020   GFRAA >60 01/24/2020   NA 141 06/11/2020   K 4.5 06/11/2020   CALCIUM 9.4 06/11/2020   CO2 40 (H) 06/11/2020   GLUCOSE 102 (H) 06/11/2020    Lab Results  Component Value Date/Time   HGBA1C 5.2 06/11/2020 11:30 AM   HGBA1C 5.3 03/27/2019 11:40 AM   GFR 69.14 06/11/2020 11:30 AM   GFR 51.84 (L) 05/19/2020 05:16 PM   MICROALBUR 0.9 09/29/2009 08:57 AM   MICROALBUR 0.1 09/19/2008 09:38 AM    Last diabetic Eye exam: No results found for: HMDIABEYEEXA  Last diabetic Foot exam: No results found for: HMDIABFOOTEX   Lab Results  Component Value Date   CHOL 155 06/11/2020   HDL 67.00 06/11/2020   LDLCALC 68 06/11/2020   LDLDIRECT 135.9 09/06/2011   TRIG 102.0 06/11/2020   CHOLHDL 2 06/11/2020    Hepatic Function Latest Ref Rng & Units 06/11/2020 05/19/2020 01/03/2020  Total Protein 6.0 - 8.3 g/dL 6.5 6.3  6.2  Albumin 3.5 - 5.2 g/dL 4.2 3.9 4.1  AST 0 - 37 U/L 22 17 20   ALT 0 - 35 U/L 15 13 20   Alk Phosphatase 39 - 117 U/L 42 41 31(L)  Total Bilirubin 0.2 - 1.2 mg/dL 0.5 0.3 0.5  Bilirubin, Direct 0.0 - 0.3 mg/dL - - -    Lab Results  Component Value Date/Time   TSH 1.39 05/19/2020 05:16 PM   TSH 1.10 01/03/2020 02:59 PM    CBC Latest Ref Rng & Units 06/11/2020  05/19/2020 01/24/2020  WBC 4.0 - 10.5 K/uL 7.6 5.8 6.1  Hemoglobin 12.0 - 15.0 g/dL 12.5 11.5(L) 12.8  Hematocrit 36.0 - 46.0 % 37.6 35.1(L) 38.9  Platelets 150.0 - 400.0 K/uL 169.0 189.0 149(L)    Lab Results  Component Value Date/Time   VD25OH 36.23 06/11/2020 11:30 AM   VD25OH 38.10 01/03/2020 02:59 PM    Clinical ASCVD: Yes  The ASCVD Risk score (Arnett DK, et al., 2019) failed to calculate for the following reasons:   The 2019 ASCVD risk score is only valid for ages 81 to 25    Depression screen PHQ 2/9 06/10/2020 03/27/2019 03/16/2018  Decreased Interest 1 0 0  Down, Depressed, Hopeless 1 0 0  PHQ - 2 Score 2 0 0  Altered sleeping 1 0 0  Tired, decreased energy 0 0 1  Change in appetite 0 0 0  Feeling bad or failure about yourself  0 0 0  Trouble concentrating 0 0 2  Moving slowly or fidgety/restless 0 0 0  Suicidal thoughts 0 0 2  PHQ-9 Score 3 0 5  Difficult doing work/chores Not difficult at all Not difficult at all Somewhat difficult  Some recent data might be hidden     Social History   Tobacco Use  Smoking Status Former   Packs/day: 3.00   Years: 34.00   Pack years: 102.00   Types: Cigarettes   Quit date: 05/29/1995   Years since quitting: 25.9  Smokeless Tobacco Never   BP Readings from Last 3 Encounters:  02/25/21 122/80  02/12/21 (!) 148/67  10/14/20 (!) 154/80   Pulse Readings from Last 3 Encounters:  02/25/21 (!) 57  10/14/20 78  08/12/20 67   Wt Readings from Last 3 Encounters:  02/25/21 124 lb 12.8 oz (56.6 kg)  02/12/21 125 lb (56.7 kg)  10/14/20 123 lb 4 oz (55.9 kg)   BMI Readings from Last 3 Encounters:  02/25/21 21.42 kg/m  02/12/21 22.50 kg/m  10/14/20 22.18 kg/m    Assessment/Interventions: Review of patient past medical history, allergies, medications, health status, including review of consultants reports, laboratory and other test data, was performed as part of comprehensive evaluation and provision of chronic care management  services.   SDOH:  (Social Determinants of Health) assessments and interventions performed: Yes  SDOH Screenings   Alcohol Screen: Low Risk    Last Alcohol Screening Score (AUDIT): 1  Depression (PHQ2-9): Low Risk    PHQ-2 Score: 3  Financial Resource Strain: Low Risk    Difficulty of Paying Living Expenses: Not hard at all  Food Insecurity: No Food Insecurity   Worried About Charity fundraiser in the Last Year: Never true   Ran Out of Food in the Last Year: Never true  Housing: Low Risk    Last Housing Risk Score: 0  Physical Activity: Inactive   Days of Exercise per Week: 0 days   Minutes of Exercise per Session: 0 min  Social  Connections: Not on file  Stress: No Stress Concern Present   Feeling of Stress : Not at all  Tobacco Use: Medium Risk   Smoking Tobacco Use: Former   Smokeless Tobacco Use: Never   Passive Exposure: Not on file  Transportation Needs: No Transportation Needs   Lack of Transportation (Medical): No   Lack of Transportation (Non-Medical): No    CCM Care Plan  Allergies  Allergen Reactions   Keflex [Cephalexin] Shortness Of Breath   Penicillins     REACTION: hives    Medications Reviewed Today     Reviewed by Charlton Haws, [facility] (Pharmacist) on 04/27/21 at 1023  Med List Status: <None>   Medication Order Taking? Sig Documenting Provider Last Dose Status Informant  albuterol (PROVENTIL) (2.5 MG/3ML) 0.083% nebulizer solution [id number] Yes USE ONE VIAL IN NEBULIZER DAILY AS NEEDED FOR RESCUE Flora Lipps, MD Taking Active   alendronate (FOSAMAX) 70 MG tablet [id number] Yes Take 1 tablet (70 mg total) by mouth once a week. Take with a full glass of water on an empty stomach. Ria Bush, MD Taking Active   arformoterol [facility]) 15 MCG/2ML NEBU [id number] Yes USE 1 VIAL  IN  NEBULIZER TWICE  DAILY - (Morning And Evening) Flora Lipps, MD Taking Active   budesonide (PULMICORT) 0.5 MG/2ML nebulizer solution [id number] Yes INHALE 1 VIAL VIA  NEBULIZER TWICE DAILY Flora Lipps, MD Taking Active   buPROPion [facility] SR) 150 MG 12 hr tablet [id number] Yes TAKE ONE TABLET BY MOUTH TWICE DAILY Ria Bush, MD Taking Active   cholecalciferol (VITAMIN D) 1000 units tablet [id number] Yes Take 2,000 Units by mouth daily. [provider] Taking Active   conjugated estrogens (PREMARIN) vaginal cream [id number] Yes Place 1 Applicatorful vaginally daily. Apply 0.33m (pea-sized amount)  just inside the vaginal introitus with a finger-tip on  Monday, Wednesday and Friday nights.  Patient taking differently: Place 1 Applicatorful vaginally as needed. Apply 0.568m(pea-sized amount)  just inside the vaginal introitus with a finger-tip on  Monday, Wednesday and Friday nights.   McNori RiisPA-C Taking Active   CRANBERRY PO [id number] Take 2 tablets by mouth every morning.  [provider] Taking Active Self  ezetimibe (ZETIA) 10 MG tablet [id number] 1 tablet (10 mg total) by mouth daily. GuRia BushMD  Active   losartan (COZAAR) 100 MG tablet [id number] TAKE 1 TABLET BY MOUTH DAILY GuRia BushMD Taking Active   metoprolol tartrate (LOPRESSOR) 25 MG tablet [id number] TAKE 1 TABLET BY MOUTH TWICE A DAVelora HecklerMD Taking Active   mirtazapine (REMERON) 15 MG tablet [id number] TAKE ONE TABLET BY MOUTH AT BEDTIME GuRia BushMD Taking Active   Multiple Vitamins-Minerals (PRESERVISION AREDS 2) CAPS [id number] Take 2 capsules by mouth daily. GuRia BushMD Taking Active   pravastatin (PRAVACHOL) 80 MG tablet [id number] TAKE 1 TABLET BY MOUTH EVERYDAY AT BEDTIME GuRia BushMD Taking Active   predniSONE (DELTASONE) 10 MG tablet [id number] Take 4 tablets (40 mg total) by mouth daily with breakfast. 10 days  Patient taking differently: Take 10 mg by mouth daily with breakfast.   KaFlora LippsMD Taking Active   PROAIR HFA 108 ([id number]) MCG/ACT inhaler [id number] TAKE 2 PUFFS BY MOUTH EVERY 4 HOURS AS NEEDED GuRia BushMD Taking Active   sulfamethoxazole-trimethoprim (BACTRIM DS) 800-160 MG tablet [id number] Take 1 tablet by mouth 2 (two) times daily. GuRia BushMD Taking Active  tiotropium (SPIRIVA) 18 MCG inhalation capsule [id number] Yes Place 1 capsule (18 mcg total) into inhaler and inhale daily. Ria Bush, MD Taking Active             Patient Active Problem List   Diagnosis Date Noted   Pain in metatarsus of left foot 08/12/2020   Right shoulder pain 06/17/2020   Abdominal aortic atherosclerosis ([place]) 08/25/2019   Right iliac artery stenosis ([facility]) 08/25/2019   GERD (gastroesophageal reflux disease) 04/03/2019   Chronic diarrhea 03/05/2019   PAD (peripheral artery disease) ([place]) 01/05/2019   Epidermal cyst 02/01/2018   Neck pain 02/01/2018   Weight loss, unintentional 12/29/2017   Hoarseness, persistent 12/29/2017   Dysphagia 11/17/2017   Orthostatic syncope 09/21/2016   Trigeminal neuralgia of right side of face 11/18/2015   COPD exacerbation ([place]) 07/29/2015   CAD (coronary artery disease) 03/04/2015   Health maintenance examination 03/04/2015   MAI (mycobacterium avium-intracellulare) ([place]) 10/23/2014   Hx of colonic polyps 04/23/2014   Advanced care planning/counseling discussion 02/18/2014   Allergic rhinitis 02/05/2014   MRSA colonization 02/05/2014   Urge incontinence of urine 03/23/2013   Recurrent UTI 07/27/2012   Chronic respiratory failure with hypoxia ([place]) 04/27/2012   Medicare annual wellness visit, subsequent 09/10/2011   Dyslipidemia 09/01/2011   Osteopenia 08/23/2011   Chronic fatigue 06/19/2008   GOLD 4 COPD with emphysema 02/01/2008   ANXIETY 11/21/2006   MDD (major depressive disorder), recurrent episode, moderate ([place]) 11/21/2006   Essential hypertension 11/21/2006   Hyperglycemia 11/21/2006    Immunization History  Administered Date(s) Administered   Fluad Quad(high  Dose 65+) 03/05/2019   Influenza Split 03/27/2012   Influenza Whole 02/21/2006, 02/21/2008, 02/22/2011   Influenza, High Dose Seasonal PF 03/05/2021   Influenza,inj,Quad PF,6+ Mos 02/16/2013, 02/18/2014, 03/04/2015, 01/21/2016, 06/15/2016, 02/01/2018   Influenza-Unspecified 04/10/2020   Moderna Sars-Covid-2 Vaccination 07/10/2019, 08/07/2019, 04/10/2020   Pfizer Covid-19 Vaccine Bivalent Booster 57yr & up 03/05/2021   Pneumococcal Conjugate-13 02/18/2014   Pneumococcal Polysaccharide-23 02/10/2009   Td 05/25/1998, 09/18/2008   Zoster Recombinat (Shingrix) 04/03/2019   Zoster, Live 11/13/2012    Conditions to be addressed/monitored:  Hypertension, Hyperlipidemia, Coronary Artery Disease, COPD, Depression, Anxiety, and Osteopenia  Care Plan : [place] Updates made by FCharlton Haws RHacienda [place] 04/27/2021 12:00 AM     Problem: Hypertension, Hyperlipidemia, Coronary Artery Disease, COPD, Depression, Anxiety, and Osteopenia   Priority: High     Long-Range Goal: Disease management   Start Date: 03/04/2021  Expected End Date: 03/04/2022  Recent Progress: On track  Priority: High  Note:   Current Barriers:  Unable to independently monitor therapeutic efficacy Suboptimal therapeutic regimen for depression  Pharmacist Clinical Goal(s):  Patient will achieve adherence to monitoring guidelines and medication adherence to achieve therapeutic efficacy adhere to plan to optimize therapeutic regimen for osteopenia as evidenced by report of adherence to recommended medication management changes through collaboration with PharmD and provider.   Interventions: 1:1 collaboration with GRia Bush MD regarding development and update of comprehensive plan of care as evidenced by provider attestation and co-signature Inter-disciplinary care team collaboration (see longitudinal plan of care) Comprehensive medication review performed; medication list updated in electronic  medical record  Hypertension (BP goal <140/90) -Not ideally controlled - BP is at goal during recent office visit; however pt has been checking at home recently and average BP is elevated, which could be related to recent addition of chronic prednisone; pt currently reports swelling in feet (new over past 1-2 months) and  progressive worsening shortness of breath; she asked if this is related to her "heart failure", via chart review pt has no evidence of heart failure -Current home readings: 124/71-162/92; avg 147/72 -Current treatment: Losartan 100 mg daily AM Metoprolol tartrate 25 mg BID -Medications previously tried: furosemide  -Current exercise habits: limited due to COPD -Denies hypotensive/hypertensive symptoms -Educated on BP goals and benefits of medications for prevention of heart attack, stroke and kidney damage; Importance of home blood pressure monitoring; -Counseled on prednisone risk for increasing BP -Advised to monitor BP at home daily -Plan: Scheduled acute PCP visit 12/6 to address swelling and SOB  Hyperlipidemia / CAD (LDL goal < 70) -Controlled - LDL is at goal; pt endorses compliance with statin but she has not been taking ezetimibe for a few months, it appears it was not transferred to her new pharmacy with the rest of her medications; attempted to coordinate transfer for ezetimibe last visit but it appears this was unsuccessful -Hx CAD, PAD, aortic atherosclerosis -Current treatment: Pravastatin 80 mg daily Ezetimibe 10 mg daily -Medications previously tried: none  -Educated on Cholesterol goals; Benefits of statin for ASCVD risk reduction; -Refilled ezetimibe to preferred pharmacy per clinic refill protocol -Recommend to continue current medication  COPD (Goal: control symptoms and prevent exacerbations) -Not ideally controlled - pt feels shortness of breath is progressively gradually worsening; she is using all respiratory medications as prescribed including new  chronic prednisone, which initially helped symptoms;  -Gold Grade: Gold 4 (FEV1<40%) -Current COPD Classification:  D (high sx, >/=2 exacerbations/yr) -MMRC/CAT score: not on file -Pulmonary function testing: December 2013 simple spirometry ratio 39%, FEV1 0.82 L (30% predicted) -Exacerbations requiring treatment in last 6 months: 2 -Current treatment  Spriva Handihaler 1 puff daily Budesonide 0.5 mg via nebulizer BID Brovana (arformoterol) 15 mcg via nebuilzer BID Albuterol 0.083% via nebulizer PRN Proair PRN Oxygen 4 L continuously Prednisone 10 mg daily -Patient reports consistent use of maintenance inhaler -Frequency of rescue inhaler use: albuterol nebuilizer 2-3 times daily; albuterol HFA periodically when outside of home -Counseled on [place] inhaler technique;Benefits of consistent maintenance inhaler use -Educated on risks of chronic steroid use - elevated BG, BG, weight gain, and bone loss; advised pt to monitor BP at home regularly -Recommended to continue current medication, f/u with pulmonary regularly  Depression/Anxiety (Goal: manage symptoms) -Not ideally controlled - pt feels her depression is worse since switching sertraline to mirtazapine; she is sleeping well but has crying spells and overall low mood a lot of the time; she engages in social activities/lunch with the other residents of her ALF on a daily basis and speaks with her children by phone almost every day; she lost her husband ~3 years ago which was very hard on her and triggered a 50 lbs wt loss over 3 years due to appetite loss -Current treatment: Bupropion SR 150 mg BID (AM and bedtime) Mirtazapine 15 mg HS -Medications previously tried/failed: sertraline (diarrhea) -PHQ9: 3 (05/2020) -Connected with PCP for mental health support -Educated on Benefits of medication for symptom control;Benefits of cognitive-behavioral therapy with or without medication; pt declined to start therapy today -Counseled to take PM  dose of bupropion in afternoon/before dinner -Recommended to increase mirtazapine to 30 mg (2 tablets); if this helps will order 30 mg dose at follow up   Osteopenia (Goal prevent fractures) -Controlled- pt is not taking calcium and is not able to exercise due to COPD; she had a fall Sept 2022 and saw an orthopedist who found no fractures;  she also recently started daily prednisone; she endorses compliance with new alendronate weekly, usually Fridays sometimes Saturdays if she forgets -Last DEXA Scan: 09/2018  T-Score femoral neck: -1.7  T-Score lumbar spine: -0.6  10-year probability of major osteoporotic fracture: 29.4%  10-year probability of hip fracture: 15.4% -Patient is a candidate for pharmacologic treatment due to T-Score -1.0 to -2.5 and 10-year risk of major osteoporotic fracture > 20% and T-Score -1.0 to -2.5 and 10-year risk of hip fracture > 3% -Current treatment  Vitamin D 2000 IU daily Alendronate 70 mg weekly -Recommend 1200 mg of calcium daily from dietary and supplemental sources.  -Counseled on benefits of bisphosphonate and administration once weekly on empty stomach 30 min before food/other medications, remain upright x 30 mins -Recommend to continue current medication  Health Maintenance -Vaccine gaps:Shingrix #2 -Not addressed today  Patient Goals/Self-Care Activities Patient will:  - take medications as prescribed -focus on medication adherence by pill box -check blood pressure daily -Increase mirtazapine to 30 mg (2 tablets) at bedtime. Take PM dose of bupropion earlier in the day (late afternoon or before dinner) -Keep appt with PCP 12/6 @ 3pm       Medication Assistance: None required.  Patient affirms current coverage meets needs.  Compliance/Adherence/Medication fill history: Care Gaps: Hep C screening Mammogram (04/12/20)  Star-Rating Drugs: Losartan - LF 03/17/21 x 90 ds; PDC 100% Pravastatin - LF 04/01/21 x 90 ds; [place] 100%  Patient's  preferred pharmacy is:  [facility], [place] [address] Phone: [phone] Fax: [phone]  Uses pill box? Yes Pt endorses 100% compliance  We discussed: Current pharmacy is preferred with insurance plan and patient is satisfied with pharmacy services; [facility] delivers her prescriptions Patient decided to: Continue current medication management strategy  Care Plan and Follow Up Patient Decision:  Patient agrees to Care Plan and Follow-up.  Plan: Telephone follow up appointment with care management team member scheduled for:  1 month  [name], PharmD, [facility] Clinical Pharmacist [facility] [phone]

## 2021-04-27 NOTE — Patient Instructions (Signed)
Visit Information  Phone number for Pharmacist: [phone]   Goals Addressed             This Visit's Progress    Track and Manage My Blood Pressure-Hypertension       Timeframe:  Long-Range Goal Priority:  High Start Date:   04/27/21                          Expected End Date:  04/27/22                     Follow Up Date Jan 2023   - check blood pressure daily - choose a place to take my blood pressure (home, clinic or office, retail store) - write blood pressure results in a log or diary    Why is this important?   You won't feel high blood pressure, but it can still hurt your blood vessels.  High blood pressure can cause heart or kidney problems. It can also cause a stroke.  Making lifestyle changes like losing a little weight or eating less salt will help.  Checking your blood pressure at home and at different times of the day can help to control blood pressure.  If the doctor prescribes medicine remember to take it the way the doctor ordered.  Call the office if you cannot afford the medicine or if there are questions about it.     Notes:         Care Plan : [place]  Updates made by Charlton Haws, RPH since 04/27/2021 12:00 AM     Problem: Hypertension, Hyperlipidemia, Coronary Artery Disease, COPD, Depression, Anxiety, and Osteopenia   Priority: High     Long-Range Goal: Disease management   Start Date: 03/04/2021  Expected End Date: 03/04/2022  Recent Progress: On track  Priority: High  Note:   Current Barriers:  Unable to independently monitor therapeutic efficacy Suboptimal therapeutic regimen for depression  Pharmacist Clinical Goal(s):  Patient will achieve adherence to monitoring guidelines and medication adherence to achieve therapeutic efficacy adhere to plan to optimize therapeutic regimen for osteopenia as evidenced by report of adherence to recommended medication management changes through collaboration with PharmD and  provider.   Interventions: 1:1 collaboration with Ria Bush, MD regarding development and update of comprehensive plan of care as evidenced by provider attestation and co-signature Inter-disciplinary care team collaboration (see longitudinal plan of care) Comprehensive medication review performed; medication list updated in electronic medical record  Hypertension (BP goal <140/90) -Not ideally controlled - BP is at goal during recent office visit; however pt has been checking at home recently and average BP is elevated, which could be related to recent addition of chronic prednisone; pt currently reports swelling in feet (new over past 1-2 months) and progressive worsening shortness of breath; she asked if this is related to her "heart failure", via chart review pt has no evidence of heart failure -Current home readings: 124/71-162/92; avg 147/72 -Current treatment: Losartan 100 mg daily AM Metoprolol tartrate 25 mg BID -Medications previously tried: furosemide  -Current exercise habits: limited due to COPD -Denies hypotensive/hypertensive symptoms -Educated on BP goals and benefits of medications for prevention of heart attack, stroke and kidney damage; Importance of home blood pressure monitoring; -Counseled on prednisone risk for increasing BP -Advised to monitor BP at home daily -Plan: Scheduled acute PCP visit 12/6 to address swelling and SOB  Hyperlipidemia / CAD (LDL goal < 70) -Controlled -  LDL is at goal; pt endorses compliance with statin but she has not been taking ezetimibe for a few months, it appears it was not transferred to her new pharmacy with the rest of her medications; attempted to coordinate transfer for ezetimibe last visit but it appears this was unsuccessful -Hx CAD, PAD, aortic atherosclerosis -Current treatment: Pravastatin 80 mg daily Ezetimibe 10 mg daily -Medications previously tried: none  -Educated on Cholesterol goals; Benefits of statin for ASCVD  risk reduction; -Refilled ezetimibe to preferred pharmacy per clinic refill protocol -Recommend to continue current medication  COPD (Goal: control symptoms and prevent exacerbations) -Not ideally controlled - pt feels shortness of breath is progressively gradually worsening; she is using all respiratory medications as prescribed including new chronic prednisone, which initially helped symptoms;  -Gold Grade: Gold 4 (FEV1<40%) -Current COPD Classification:  D (high sx, >/=2 exacerbations/yr) -MMRC/CAT score: not on file -Pulmonary function testing: December 2013 simple spirometry ratio 39%, FEV1 0.82 L (30% predicted) -Exacerbations requiring treatment in last 6 months: 2 -Current treatment  Spriva Handihaler 1 puff daily Budesonide 0.5 mg via nebulizer BID Brovana (arformoterol) 15 mcg via nebuilzer BID Albuterol 0.083% via nebulizer PRN Proair PRN Oxygen 4 L continuously Prednisone 10 mg daily -Patient reports consistent use of maintenance inhaler -Frequency of rescue inhaler use: albuterol nebuilizer 2-3 times daily; albuterol HFA periodically when outside of home -Counseled on Proper inhaler technique;Benefits of consistent maintenance inhaler use -Educated on risks of chronic steroid use - elevated BG, BG, weight gain, and bone loss; advised pt to monitor BP at home regularly -Recommended to continue current medication, f/u with pulmonary regularly  Depression/Anxiety (Goal: manage symptoms) -Not ideally controlled - pt feels her depression is worse since switching sertraline to mirtazapine; she is sleeping well but has crying spells and overall low mood a lot of the time; she engages in social activities/lunch with the other residents of her ALF on a daily basis and speaks with her children by phone almost every day; she lost her husband ~3 years ago which was very hard on her and triggered a 50 lbs wt loss over 3 years due to appetite loss -Current treatment: Bupropion SR 150 mg  BID (AM and bedtime) Mirtazapine 15 mg HS -Medications previously tried/failed: sertraline (diarrhea) -PHQ9: 3 (05/2020) -Connected with PCP for mental health support -Educated on Benefits of medication for symptom control;Benefits of cognitive-behavioral therapy with or without medication; pt declined to start therapy today -Counseled to take PM dose of bupropion in afternoon/before dinner -Recommended to increase mirtazapine to 30 mg (2 tablets); if this helps will order 30 mg dose at follow up   Osteopenia (Goal prevent fractures) -Controlled- pt is not taking calcium and is not able to exercise due to COPD; she had a fall Sept 2022 and saw an orthopedist who found no fractures; she also recently started daily prednisone; she endorses compliance with new alendronate weekly, usually Fridays sometimes Saturdays if she forgets -Last DEXA Scan: 09/2018  T-Score femoral neck: -1.7  T-Score lumbar spine: -0.6  10-year probability of major osteoporotic fracture: 29.4%  10-year probability of hip fracture: 15.4% -Patient is a candidate for pharmacologic treatment due to T-Score -1.0 to -2.5 and 10-year risk of major osteoporotic fracture > 20% and T-Score -1.0 to -2.5 and 10-year risk of hip fracture > 3% -Current treatment  Vitamin D 2000 IU daily Alendronate 70 mg weekly -Recommend 1200 mg of calcium daily from dietary and supplemental sources.  -Counseled on benefits of bisphosphonate and administration once  weekly on empty stomach 30 min before food/other medications, remain upright x 30 mins -Recommend to continue current medication  Health Maintenance -Vaccine gaps:Shingrix #2 -Not addressed today  Patient Goals/Self-Care Activities Patient will:  - take medications as prescribed -focus on medication adherence by pill box -check blood pressure daily -Increase mirtazapine to 30 mg (2 tablets) at bedtime. Take PM dose of bupropion earlier in the day (late afternoon or before  dinner) -Keep appt with PCP 12/6 @ 3pm      Patient verbalizes understanding of instructions provided today and agrees to view in [place].  Telephone follow up appointment with pharmacy team member scheduled for: 1 month  [name], PharmD, [facility] Clinical Pharmacist [facility] at [facility] [phone]

## 2021-04-28 ENCOUNTER — Encounter: Payer: Self-pay | Admitting: Family Medicine

## 2021-04-28 ENCOUNTER — Ambulatory Visit (INDEPENDENT_AMBULATORY_CARE_PROVIDER_SITE_OTHER): Payer: PPO | Admitting: Family Medicine

## 2021-04-28 VITALS — BP 152/90 | HR 70 | Temp 98.2°F | Ht 64.0 in | Wt 128.5 lb

## 2021-04-28 DIAGNOSIS — R350 Frequency of micturition: Secondary | ICD-10-CM | POA: Diagnosis not present

## 2021-04-28 DIAGNOSIS — J439 Emphysema, unspecified: Secondary | ICD-10-CM | POA: Diagnosis not present

## 2021-04-28 DIAGNOSIS — I10 Essential (primary) hypertension: Secondary | ICD-10-CM

## 2021-04-28 DIAGNOSIS — R600 Localized edema: Secondary | ICD-10-CM | POA: Diagnosis not present

## 2021-04-28 DIAGNOSIS — R634 Abnormal weight loss: Secondary | ICD-10-CM

## 2021-04-28 DIAGNOSIS — K529 Noninfective gastroenteritis and colitis, unspecified: Secondary | ICD-10-CM

## 2021-04-28 DIAGNOSIS — F331 Major depressive disorder, recurrent, moderate: Secondary | ICD-10-CM | POA: Diagnosis not present

## 2021-04-28 DIAGNOSIS — J9611 Chronic respiratory failure with hypoxia: Secondary | ICD-10-CM | POA: Diagnosis not present

## 2021-04-28 DIAGNOSIS — N390 Urinary tract infection, site not specified: Secondary | ICD-10-CM | POA: Diagnosis not present

## 2021-04-28 LAB — POC URINALSYSI DIPSTICK (AUTOMATED)
Bilirubin, UA: NEGATIVE
Blood, UA: NEGATIVE
Glucose, UA: NEGATIVE
Ketones, UA: NEGATIVE
Nitrite, UA: NEGATIVE
Protein, UA: NEGATIVE
Spec Grav, UA: 1.01 (ref 1.010–1.025)
Urobilinogen, UA: 0.2 E.U./dL
pH, UA: 7.5 (ref 5.0–8.0)

## 2021-04-28 MED ORDER — HYDROCHLOROTHIAZIDE 12.5 MG PO CAPS: 12.5000 mg | ORAL_CAPSULE | Freq: Every day | ORAL | 4 refills | Status: DC

## 2021-04-28 NOTE — Progress Notes (Signed)
[name], female    DOB: 31-Dec-1940, 80 y.o.   MRN: [id number]  This visit was conducted in person.  BP (!) 152/90   Pulse 70   Temp 98.2 F (36.8 C) (Temporal)   Ht 5\' 4"  (1.626 m)   Wt 128 lb 8 oz (58.3 kg)   SpO2 98% Comment: 3 L  BMI 22.06 kg/m   160s/80 on repeat testing   CC: bilateral foot swelling, worsening shortness of breath, urinary frequency/urgency Subjective:   HPI: [name] [name] is a 80 y.o. female presenting on 04/28/2021 for Foot Swelling (C/o bilateral foot swelling and worsened SOB. Sxs started mos ago.  Accompanied by son, Aaron Edelman. ), Urinary Frequency (C/o urinary frequency and urgency.  Sxs started about 2 wks ago. ), and Depression (Worsened depression/anxiety. )   Multiple concerns today:  Elevated blood pressures noted for the past month, as well as foot swelling and numbness may have also started at this time. Normal antihypertensive regimen is losartan 100mg  daily, metoprolol 25mg  bid. No palpitations, chest pain, headache, dizziness.  On prednisone 10mg  daily by pulmonology for severe end stage oxygen dependent COPD (02/2021). No recent change to chronic dyspnea from COPD.   2 wk h/o increased urinary urgency and frequency, also notes recurrent odor. No recent dysuria, abd pain, nausea, flank pain, hematuria.   Treated 04/03/2021 (Friday afternoon) for presumptive UTI after she called in with ongoing symptoms of dysuria, urine odor and frequency for 1 month. At that time treated with 5d bactrim DS course.  Symptoms did improve after this.   Notes worsening depression/anxiety but an ongoing issue for the past 6 months. This is despite wellbutrin SR 150mg  bid and remeron 15mg  nightly (recent increase). SSRI may have caused diarrhea.      Relevant past medical, surgical, family and social history reviewed and updated as indicated. Interim medical history since our last visit reviewed. Allergies and medications reviewed and  updated. Outpatient Medications Prior to Visit  Medication Sig Dispense Refill   albuterol (PROVENTIL) (2.5 MG/3ML) 0.083% nebulizer solution USE ONE VIAL IN NEBULIZER DAILY AS NEEDED FOR RESCUE 120 mL 6   alendronate (FOSAMAX) 70 MG tablet Take 1 tablet (70 mg total) by mouth once a week. Take with a full glass of water on an empty stomach. 4 tablet 11   arformoterol (BROVANA) 15 MCG/2ML NEBU USE 1 VIAL  IN  NEBULIZER TWICE  DAILY - (Morning And Evening) 1500 mL 11   budesonide (PULMICORT) 0.5 MG/2ML nebulizer solution INHALE 1 VIAL VIA NEBULIZER TWICE DAILY 120 mL 0   buPROPion (WELLBUTRIN SR) 150 MG 12 hr tablet TAKE ONE TABLET BY MOUTH TWICE DAILY 180 tablet 0   cholecalciferol (VITAMIN D) 1000 units tablet Take 2,000 Units by mouth daily.     conjugated estrogens (PREMARIN) vaginal cream Place 1 Applicatorful vaginally daily. Apply 0.5mg  (pea-sized amount)  just inside the vaginal introitus with a finger-tip on  Monday, Wednesday and Friday nights. (Patient taking differently: Place 1 Applicatorful vaginally as needed. Apply 0.5mg  (pea-sized amount)  just inside the vaginal introitus with a finger-tip on  Monday, Wednesday and Friday nights.) 30 g 12   CRANBERRY PO Take 2 tablets by mouth every morning.      ezetimibe (ZETIA) 10 MG tablet Take 1 tablet (10 mg total) by mouth daily. 90 tablet 0   losartan (COZAAR) 100 MG tablet TAKE 1 TABLET BY MOUTH DAILY 90 tablet 0   metoprolol tartrate (LOPRESSOR) 25 MG tablet  TAKE 1 TABLET BY MOUTH TWICE A DAY 180 tablet 3   mirtazapine (REMERON) 15 MG tablet TAKE ONE TABLET BY MOUTH AT BEDTIME 30 tablet 4   Multiple Vitamins-Minerals (PRESERVISION AREDS 2) CAPS Take 2 capsules by mouth daily.     pravastatin (PRAVACHOL) 80 MG tablet TAKE 1 TABLET BY MOUTH EVERYDAY AT BEDTIME 90 tablet 3   predniSONE (DELTASONE) 10 MG tablet Take 4 tablets (40 mg total) by mouth daily with breakfast. 10 days (Patient taking differently: Take 10 mg by mouth daily with  breakfast.) 30 tablet 6   PROAIR HFA 108 (90 Base) MCG/ACT inhaler TAKE 2 PUFFS BY MOUTH EVERY 4 HOURS AS NEEDED 8.5 each 3   sulfamethoxazole-trimethoprim (BACTRIM DS) 800-160 MG tablet Take 1 tablet by mouth 2 (two) times daily. 10 tablet 0   tiotropium (SPIRIVA) 18 MCG inhalation capsule Place 1 capsule (18 mcg total) into inhaler and inhale daily. 90 capsule 2   No facility-administered medications prior to visit.     Per HPI unless specifically indicated in ROS section below Review of Systems  Objective:  BP (!) 152/90   Pulse 70   Temp 98.2 F (36.8 C) (Temporal)   Ht 5\' 4"  (1.626 m)   Wt 128 lb 8 oz (58.3 kg)   SpO2 98% Comment: 3 L  BMI 22.06 kg/m   Wt Readings from Last 3 Encounters:  04/28/21 128 lb 8 oz (58.3 kg)  02/25/21 124 lb 12.8 oz (56.6 kg)  02/12/21 125 lb (56.7 kg)      Physical Exam Vitals and nursing note reviewed.  Constitutional:      Appearance: Normal appearance. She is not ill-appearing.     Comments: Supplemental oxygen via [place] at 3L  Cardiovascular:     Rate and Rhythm: Normal rate and regular rhythm.     Pulses: Normal pulses.     Heart sounds: Normal heart sounds. No murmur heard. Pulmonary:     Effort: Pulmonary effort is normal. No respiratory distress.     Breath sounds: Normal breath sounds. No wheezing, rhonchi or rales.     Comments: Coarse breath sounds Musculoskeletal:     Right lower leg: No edema.     Left lower leg: No edema.     Comments: 2+ DP  bilaterally  Skin:    General: Skin is warm and dry.  Neurological:     Mental Status: She is alert.  Psychiatric:        Mood and Affect: Mood normal.        Behavior: Behavior normal.      Results for orders placed or performed in visit on 04/28/21  POCT Urinalysis Dipstick (Automated)  Result Value Ref Range   Color, UA yellow    Clarity, UA clear    Glucose, UA Negative Negative   Bilirubin, UA negative    Ketones, UA negative    Spec Grav, UA 1.010 1.010 - 1.025    Blood, UA negative    pH, UA 7.5 5.0 - 8.0   Protein, UA Negative Negative   Urobilinogen, UA 0.2 0.2 or 1.0 E.U./dL   Nitrite, UA negative    Leukocytes, UA Large (3+) (A) Negative   Depression screen [facility] 2/9 04/28/2021 06/10/2020 03/27/2019 03/16/2018 08/12/2016  Decreased Interest 1 1 0 0 0  Down, Depressed, Hopeless 3 1 0 0 0  PHQ - 2 Score 4 2 0 0 0  Altered sleeping 1 1 0 0 -  Tired, decreased energy 2 0 0  1 -  Change in appetite 2 0 0 0 -  Feeling bad or failure about yourself  2 0 0 0 -  Trouble concentrating 1 0 0 2 -  Moving slowly or fidgety/restless 0 0 0 0 -  Suicidal thoughts 2 0 0 2 -  PHQ-9 Score 14 3 0 5 -  Difficult doing work/chores - Not difficult at all Not difficult at all Somewhat difficult -  Some recent data might be hidden    GAD 7 : Generalized Anxiety Score 04/28/2021  Nervous, Anxious, on Edge 1  Control/stop worrying 2  Worry too much - different things 2  Trouble relaxing 1  Restless 0  Easily annoyed or irritable 2  Afraid - awful might happen 0  Total GAD 7 Score 8   Assessment & Plan:  This visit occurred during the SARS-CoV-2 public health emergency.  Safety protocols were in place, including screening questions prior to the visit, additional usage of staff PPE, and extensive cleaning of exam room while observing appropriate contact time as indicated for disinfecting solutions.   Problem List Items Addressed This Visit     MDD (major depressive disorder), recurrent episode, moderate ([place])    Ongoing depression /anxiety despite changes in med regimen this year - changed from sertraline to wellbutrin due to concern over SSRI induced colitis causing chronic diarrhea.  She recently increased remeron to 15mg  daily - will update [place] with effect.       Essential hypertension - Primary    Chronic, deteriorated. Add hctz 12.5mg  daily, update with effect.  Advised start this after she finishes bactrim course.       Relevant Medications    hydrochlorothiazide (MICROZIDE) 12.5 MG capsule   Other Relevant Orders   Basic metabolic panel   GOLD 4 COPD with emphysema    Continue prednisone, brovana, spiriva and pulmicort neb. Regularly sees pulm.       Chronic respiratory failure with hypoxia (HCC)    Continue supplemental oxygen.       Recurrent UTI    UA/micro and story consistent with UTI.  Start bactrim 5d course (previously seemed effective).  This would be second UTI this year (first one treated over phone 03/2021).       Weight loss, unintentional    Weight gain noted today      Chronic diarrhea    Stable period off sertraline.       Pedal edema    Overall benign exam today.  Trial hctz 12.5mg  daily.       Relevant Orders   CBC with Differential/Platelet   Brain natriuretic peptide   Other Visit Diagnoses     Urinary frequency       Relevant Orders   POCT Urinalysis Dipstick (Automated) (Completed)   Urine Culture        Meds ordered this encounter  Medications   hydrochlorothiazide (MICROZIDE) 12.5 MG capsule    Sig: Take 1 capsule (12.5 mg total) by mouth daily.    Dispense:  30 capsule    Refill:  4    Orders Placed This Encounter  Procedures   Urine Culture   Basic metabolic panel   CBC with Differential/Platelet   Brain natriuretic peptide   POCT Urinalysis Dipstick (Automated)     Patient Instructions  Labs today.  Urine test today.  Start bactrim for possible UTI - take bactrim for 5 days prior to starting new water pill hydrochlorothiazide.  Start hydrochlorothiazide 12.5mg  daily - new water pill  that should help blood pressures and leg swelling. Continue other 2 medicines. Increase potassium rich foods.  Keep an eye on blood pressures at home, let me know if consistently saying >150/100.  Update me with effect of higher remeron dose on mood after 3-4 weeks.   Follow up plan: Return if symptoms worsen or fail to improve.  Ria Bush, MD

## 2021-04-28 NOTE — Patient Instructions (Addendum)
Labs today.  Urine test today.  Start bactrim for possible UTI - take bactrim for 5 days prior to starting new water pill hydrochlorothiazide.  Start hydrochlorothiazide 12.5mg  daily - new water pill that should help blood pressures and leg swelling. Continue other 2 medicines. Increase potassium rich foods.  Keep an eye on blood pressures at home, let me know if consistently saying >150/100.  Update me with effect of higher remeron dose on mood after 3-4 weeks.

## 2021-04-29 ENCOUNTER — Telehealth: Payer: Self-pay

## 2021-04-29 LAB — BASIC METABOLIC PANEL
BUN: 17 mg/dL (ref 6–23)
CO2: 38 mEq/L — ABNORMAL HIGH (ref 19–32)
Calcium: 9.6 mg/dL (ref 8.4–10.5)
Chloride: 96 mEq/L (ref 96–112)
Creatinine, Ser: 0.91 mg/dL (ref 0.40–1.20)
GFR: 59.76 mL/min — ABNORMAL LOW (ref >60.00)
Glucose, Bld: 112 mg/dL — ABNORMAL HIGH (ref 70–99)
Potassium: 4.7 mEq/L (ref 3.5–5.1)
Sodium: 139 mEq/L (ref 135–145)

## 2021-04-29 LAB — CBC WITH DIFFERENTIAL/PLATELET
Basophils Absolute: 0 10*3/uL (ref 0.0–0.1)
Basophils Relative: 0.5 % (ref 0.0–3.0)
Eosinophils Absolute: 0 10*3/uL (ref 0.0–0.7)
Eosinophils Relative: 0.1 % (ref 0.0–5.0)
HCT: 37 % (ref 36.0–46.0)
Hemoglobin: 11.9 g/dL — ABNORMAL LOW (ref 12.0–15.0)
Lymphocytes Relative: 14.8 % (ref 12.0–46.0)
Lymphs Abs: 1 10*3/uL (ref 0.7–4.0)
MCHC: 32.1 g/dL (ref 30.0–36.0)
MCV: 97 fl (ref 78.0–100.0)
Monocytes Absolute: 0.2 10*3/uL (ref 0.1–1.0)
Monocytes Relative: 3 % (ref 3.0–12.0)
Neutro Abs: 5.7 10*3/uL (ref 1.4–7.7)
Neutrophils Relative %: 81.6 % — ABNORMAL HIGH (ref 43.0–77.0)
Platelets: 152 10*3/uL (ref 150.0–400.0)
RBC: 3.82 Mil/uL — ABNORMAL LOW (ref 3.87–5.11)
RDW: 13.5 % (ref 11.5–15.5)
WBC: 7 10*3/uL (ref 4.0–10.5)

## 2021-04-29 LAB — BRAIN NATRIURETIC PEPTIDE: Pro B Natriuretic peptide (BNP): 113 pg/mL — ABNORMAL HIGH (ref 0.0–100.0)

## 2021-04-29 MED ORDER — SULFAMETHOXAZOLE-TRIMETHOPRIM 800-160 MG PO TABS
1.0000 | ORAL_TABLET | Freq: Two times a day (BID) | ORAL | 0 refills | Status: DC
Start: 2021-04-29 — End: 2021-09-09

## 2021-04-29 NOTE — Assessment & Plan Note (Signed)
Weight gain noted today

## 2021-04-29 NOTE — Assessment & Plan Note (Addendum)
UA/micro and story consistent with UTI.  Start bactrim 5d course (previously seemed effective).  This would be second UTI this year (first one treated over phone 03/2021).

## 2021-04-29 NOTE — Assessment & Plan Note (Signed)
Stable period off sertraline.

## 2021-04-29 NOTE — Assessment & Plan Note (Signed)
Chronic, deteriorated. Add hctz 12.5mg  daily, update with effect.  Advised start this after she finishes bactrim course.

## 2021-04-29 NOTE — Progress Notes (Signed)
    Chronic Care Management Pharmacy Assistant   Name: [name] [name]  MRN: [id number] DOB: April 02, 1941  Reason for Encounter: CCM (Bactrim Medication)  Patient called me stating she saw Dr. Darlin Priestly yesterday. She was supposed to start Bactrim, but it was not called in to her pharmacy. Patient did receive her HCTZ 12.5 mg that they discussed; just not the Bactrim. Patient would like medication sent in to [place]  [place] [address]  Phone:  [phone]  Fax:  [phone]. Patient is requesting medication be delivered.   Debbora Dus, CPP notified  [name], [place] Clinical Pharmacy Assistant [phone]  Time Spent:  10 Minutes

## 2021-04-29 NOTE — Assessment & Plan Note (Signed)
Overall benign exam today.  Trial hctz 12.5mg  daily.

## 2021-04-29 NOTE — Telephone Encounter (Signed)
Patient notified as instructed by telephone and verbalized understanding. 

## 2021-04-29 NOTE — Addendum Note (Signed)
Addended by: Ria Bush on: 04/29/2021 02:45 PM   Modules accepted: Orders

## 2021-04-29 NOTE — Assessment & Plan Note (Signed)
Continue supplemental oxygen. 

## 2021-04-29 NOTE — Assessment & Plan Note (Signed)
Ongoing depression /anxiety despite changes in med regimen this year - changed from sertraline to wellbutrin due to concern over SSRI induced colitis causing chronic diarrhea.  She recently increased remeron to 15mg  daily - will update [place] with effect.

## 2021-04-29 NOTE — Telephone Encounter (Signed)
Sorry about that - [facility]

## 2021-04-29 NOTE — Assessment & Plan Note (Signed)
Continue prednisone, brovana, spiriva and pulmicort neb. Regularly sees pulm.

## 2021-04-30 LAB — URINE CULTURE
MICRO NUMBER:: 12720355
SPECIMEN QUALITY:: ADEQUATE

## 2021-05-23 DIAGNOSIS — F331 Major depressive disorder, recurrent, moderate: Secondary | ICD-10-CM | POA: Diagnosis not present

## 2021-05-23 DIAGNOSIS — E785 Hyperlipidemia, unspecified: Secondary | ICD-10-CM

## 2021-05-23 DIAGNOSIS — I10 Essential (primary) hypertension: Secondary | ICD-10-CM | POA: Diagnosis not present

## 2021-05-26 ENCOUNTER — Telehealth: Payer: Self-pay

## 2021-05-26 NOTE — Progress Notes (Signed)
° ° °  Chronic Care Management Pharmacy Assistant   Name: [name] [name]  MRN: [id number] DOB: 1940/09/12  Reason for Encounter: CCM (Appointment Reminder)  Medications: Outpatient Encounter Medications as of 05/26/2021  Medication Sig   albuterol (PROVENTIL) (2.5 MG/3ML) 0.083% nebulizer solution USE ONE VIAL IN NEBULIZER DAILY AS NEEDED FOR RESCUE   alendronate (FOSAMAX) 70 MG tablet Take 1 tablet (70 mg total) by mouth once a week. Take with a full glass of water on an empty stomach.   arformoterol (BROVANA) 15 MCG/2ML NEBU USE 1 VIAL  IN  NEBULIZER TWICE  DAILY - (Morning And Evening)   budesonide (PULMICORT) 0.5 MG/2ML nebulizer solution INHALE 1 VIAL VIA NEBULIZER TWICE DAILY   buPROPion (WELLBUTRIN SR) 150 MG 12 hr tablet TAKE ONE TABLET BY MOUTH TWICE DAILY   cholecalciferol (VITAMIN D) 1000 units tablet Take 2,000 Units by mouth daily.   conjugated estrogens (PREMARIN) vaginal cream Place 1 Applicatorful vaginally daily. Apply 0.5mg  (pea-sized amount)  just inside the vaginal introitus with a finger-tip on  Monday, Wednesday and Friday nights. (Patient taking differently: Place 1 Applicatorful vaginally as needed. Apply 0.5mg  (pea-sized amount)  just inside the vaginal introitus with a finger-tip on  Monday, Wednesday and Friday nights.)   CRANBERRY PO Take 2 tablets by mouth every morning.    ezetimibe (ZETIA) 10 MG tablet Take 1 tablet (10 mg total) by mouth daily.   hydrochlorothiazide (MICROZIDE) 12.5 MG capsule Take 1 capsule (12.5 mg total) by mouth daily.   losartan (COZAAR) 100 MG tablet TAKE 1 TABLET BY MOUTH DAILY   metoprolol tartrate (LOPRESSOR) 25 MG tablet TAKE 1 TABLET BY MOUTH TWICE A DAY   mirtazapine (REMERON) 15 MG tablet TAKE ONE TABLET BY MOUTH AT BEDTIME   Multiple Vitamins-Minerals (PRESERVISION AREDS 2) CAPS Take 2 capsules by mouth daily.   pravastatin (PRAVACHOL) 80 MG tablet TAKE 1 TABLET BY MOUTH EVERYDAY AT BEDTIME   predniSONE (DELTASONE) 10 MG tablet  Take 4 tablets (40 mg total) by mouth daily with breakfast. 10 days (Patient taking differently: Take 10 mg by mouth daily with breakfast.)   PROAIR HFA 108 (90 Base) MCG/ACT inhaler TAKE 2 PUFFS BY MOUTH EVERY 4 HOURS AS NEEDED   sulfamethoxazole-trimethoprim (BACTRIM DS) 800-160 MG tablet Take 1 tablet by mouth 2 (two) times daily.   tiotropium (SPIRIVA) 18 MCG inhalation capsule Place 1 capsule (18 mcg total) into inhaler and inhale daily.   No facility-administered encounter medications on file as of 05/26/2021.    [name] was contacted to remind her of her upcoming telephone visit with [name] on 05/29/2021 at 1:00 pm. Patient was reminded to have all medications, supplements and any blood glucose and blood pressure readings available for review at appointment.  Are you having any problems with your medications? No  Do you have any concerns you like to discuss with the pharmacist? No  Star Rating Drugs: Medication:  Last Fill: Day Supply Losartan 100 mg         03/17/2021      90                     Pravastatin 80 mg       04/01/2021      [name], CPP notified  [name], [name]  Time Spent: 10 Minutes

## 2021-05-29 ENCOUNTER — Ambulatory Visit (INDEPENDENT_AMBULATORY_CARE_PROVIDER_SITE_OTHER): Payer: PPO | Admitting: Pharmacist

## 2021-05-29 ENCOUNTER — Other Ambulatory Visit: Payer: Self-pay

## 2021-05-29 DIAGNOSIS — J439 Emphysema, unspecified: Secondary | ICD-10-CM

## 2021-05-29 DIAGNOSIS — I10 Essential (primary) hypertension: Secondary | ICD-10-CM

## 2021-05-29 DIAGNOSIS — E785 Hyperlipidemia, unspecified: Secondary | ICD-10-CM

## 2021-05-29 DIAGNOSIS — F331 Major depressive disorder, recurrent, moderate: Secondary | ICD-10-CM

## 2021-05-29 DIAGNOSIS — M85852 Other specified disorders of bone density and structure, left thigh: Secondary | ICD-10-CM

## 2021-05-29 NOTE — Progress Notes (Signed)
Chronic Care Management Pharmacy Note  05/29/2021 Name:  [name] [name] MRN:  [id number] DOB:  07-03-1940  Summary: -Pt reports improvement in BP and swelling since starting HCTZ last month -Pt reports improved mood, she is compliant with mirtazapine and bupropion as prescribed  Recommendations/Changes made from today's visit: -No med changes  Follow up Plan: -Amador will call patient 1 month for BP log -PCP annual visit 08/05/21 -Pharmacist follow up televisit scheduled for 3 months  Subjective: [name] [name] is an 81 y.o. year old female who is a primary patient of Ria Bush, MD.  The CCM team was consulted for assistance with disease management and care coordination needs.    Engaged with patient by telephone for follow up visit in response to provider referral for pharmacy case management and/or care coordination services.   Consent to Services:  The patient was given information about Chronic Care Management services, agreed to services, and gave verbal consent prior to initiation of services.  Please see initial visit note for detailed documentation.   Patient Care Team: Ria Bush, MD as PCP - General (Family Medicine) Rockey Situ Kathlene November, MD as Consulting Physician (Cardiology) Charlton Haws, [facility] as Pharmacist (Pharmacist)   Patient lives in assisted living with her own apartment. Her husband died 3 years ago. She has lunch every day with other residents. She does cook for herself sometimes. Wakes up 9:30-10am (daughter calls every day); she takes a nap every afternoon. She likes to read, reads in bed about 7:30-8pm, typically goes to sleep around 11-11:30pm. She wakes up about once per night to use bathroom.  Recent office visits: 04/28/21 Dr Danise Mina OV: HTN, swelling, urinary frequency, depression. Add HCTZ 12.5 mg. Tx UTI with Bactrim. 12/21/20 PCP pt message - rx'd prednisone taper for COPD exacerbation. 10/14/20 Danise Mina  (PCP)- SOB. Covid negative. Start prednisone 20 mg. D/c Pancrelipase 36000 units.  Recent consult visits: 02/25/21 Dr Mortimer Fries (pulmonary): f/u COPD; pt wants to start daily prednisone. Rx'd prednisone 10 mg daily  02/12/21 [facility] (Urology) - Microscopic hematuria, hx recurrent UTI. No med changes. Negative urinalysis.  Hospital visits: None in previous 6 months   Objective:  Lab Results  Component Value Date   CREATININE 0.91 04/28/2021   BUN 17 04/28/2021   GFR 59.76 (L) 04/28/2021   GFRNONAA >60 01/24/2020   GFRAA >60 01/24/2020   NA 139 04/28/2021   K 4.7 04/28/2021   CALCIUM 9.6 04/28/2021   CO2 38 (H) 04/28/2021   GLUCOSE 112 (H) 04/28/2021    Lab Results  Component Value Date/Time   HGBA1C 5.2 06/11/2020 11:30 AM   HGBA1C 5.3 03/27/2019 11:40 AM   GFR 59.76 (L) 04/28/2021 04:12 PM   GFR 69.14 06/11/2020 11:30 AM   MICROALBUR 0.9 09/29/2009 08:57 AM   MICROALBUR 0.1 09/19/2008 09:38 AM    Last diabetic Eye exam: No results found for: HMDIABEYEEXA  Last diabetic Foot exam: No results found for: HMDIABFOOTEX   Lab Results  Component Value Date   CHOL 155 06/11/2020   HDL 67.00 06/11/2020   LDLCALC 68 06/11/2020   LDLDIRECT 135.9 09/06/2011   TRIG 102.0 06/11/2020   CHOLHDL 2 06/11/2020    Hepatic Function Latest Ref Rng & Units 06/11/2020 05/19/2020 01/03/2020  Total Protein 6.0 - 8.3 g/dL 6.5 6.3 6.2  Albumin 3.5 - 5.2 g/dL 4.2 3.9 4.1  AST 0 - 37 U/L _0 ALT 0 - 35 U/L _1 Alk Phosphatase 39 - 117  U/L 42 41 31(L)  Total Bilirubin 0.2 - 1.2 mg/dL 0.5 0.3 0.5  Bilirubin, Direct 0.0 - 0.3 mg/dL - - -    Lab Results  Component Value Date/Time   TSH 1.39 05/19/2020 05:16 PM   TSH 1.10 01/03/2020 02:59 PM    CBC Latest Ref Rng & Units 04/28/2021 06/11/2020 05/19/2020  WBC 4.0 - 10.5 K/uL 7.0 7.6 5.8  Hemoglobin 12.0 - 15.0 g/dL 11.9(L) 12.5 11.5(L)  Hematocrit 36.0 - 46.0 % 37.0 37.6 35.1(L)  Platelets 150.0 - 400.0 K/uL 152.0 169.0 189.0     Lab Results  Component Value Date/Time   VD25OH 36.23 06/11/2020 11:30 AM   VD25OH 38.10 01/03/2020 02:59 PM    Clinical ASCVD: Yes  The ASCVD Risk score (Arnett DK, et al., 2019) failed to calculate for the following reasons:   The 2019 ASCVD risk score is only valid for ages 24 to 1    Depression screen PHQ 2/9 04/28/2021 06/10/2020 03/27/2019  Decreased Interest 1 1 0  Down, Depressed, Hopeless 3 1 0  PHQ - 2 Score 4 2 0  Altered sleeping 1 1 0  Tired, decreased energy 2 0 0  Change in appetite 2 0 0  Feeling bad or failure about yourself  2 0 0  Trouble concentrating 1 0 0  Moving slowly or fidgety/restless 0 0 0  Suicidal thoughts 2 0 0  PHQ-9 Score 14 3 0  Difficult doing work/chores - Not difficult at all Not difficult at all  Some recent data might be hidden     Social History   Tobacco Use  Smoking Status Former   Packs/day: 3.00   Years: 34.00   Pack years: 102.00   Types: Cigarettes   Quit date: 05/29/1995   Years since quitting: 26.0  Smokeless Tobacco Never   BP Readings from Last 3 Encounters:  04/28/21 (!) 152/90  02/25/21 122/80  02/12/21 (!) 148/67   Pulse Readings from Last 3 Encounters:  04/28/21 70  02/25/21 (!) 57  10/14/20 78   Wt Readings from Last 3 Encounters:  04/28/21 128 lb 8 oz (58.3 kg)  02/25/21 124 lb 12.8 oz (56.6 kg)  02/12/21 125 lb (56.7 kg)   BMI Readings from Last 3 Encounters:  04/28/21 22.06 kg/m  02/25/21 21.42 kg/m  02/12/21 22.50 kg/m    Assessment/Interventions: Review of patient past medical history, allergies, medications, health status, including review of consultants reports, laboratory and other test data, was performed as part of comprehensive evaluation and provision of chronic care management services.   SDOH:  (Social Determinants of Health) assessments and interventions performed: Yes  SDOH Screenings   Alcohol Screen: Low Risk    Last Alcohol Screening Score (AUDIT): 1  Depression (PHQ2-9):  Medium Risk   PHQ-2 Score: 14  Financial Resource Strain: Low Risk    Difficulty of Paying Living Expenses: Not hard at all  Food Insecurity: No Food Insecurity   Worried About Charity fundraiser in the Last Year: Never true   Ran Out of Food in the Last Year: Never true  Housing: Low Risk    Last Housing Risk Score: 0  Physical Activity: Inactive   Days of Exercise per Week: 0 days   Minutes of Exercise per Session: 0 min  Social Connections: Not on file  Stress: No Stress Concern Present   Feeling of Stress : Not at all  Tobacco Use: Medium Risk   Smoking Tobacco Use: Former   Smokeless Tobacco Use: Never  Passive Exposure: Not on file  Transportation Needs: No Transportation Needs   Lack of Transportation (Medical): No   Lack of Transportation (Non-Medical): No    CCM Care Plan  Allergies  Allergen Reactions   Keflex [Cephalexin] Shortness Of Breath   Penicillins     REACTION: hives    Medications Reviewed Today     Reviewed by Charlton Haws, [facility] (Pharmacist) on 05/29/21 at [place] List Status: <None>   Medication Order Taking? Sig Documenting Provider Last Dose Status Informant  albuterol (PROVENTIL) (2.5 MG/3ML) 0.083% nebulizer solution [id number] Yes USE ONE VIAL IN NEBULIZER DAILY AS NEEDED FOR RESCUE Flora Lipps, MD Taking Active   alendronate (FOSAMAX) 70 MG tablet [id number] Yes Take 1 tablet (70 mg total) by mouth once a week. Take with a full glass of water on an empty stomach. Ria Bush, MD Taking Active   arformoterol [facility]) 15 MCG/2ML NEBU [id number] Yes USE 1 VIAL  IN  NEBULIZER TWICE  DAILY - (Morning And Evening) Flora Lipps, MD Taking Active   budesonide (PULMICORT) 0.5 MG/2ML nebulizer solution [id number] Yes INHALE 1 VIAL VIA NEBULIZER TWICE DAILY Flora Lipps, MD Taking Active   buPROPion [facility] SR) 150 MG 12 hr tablet [id number] Yes TAKE ONE TABLET BY MOUTH TWICE DAILY Ria Bush, MD Taking Active   cholecalciferol  (VITAMIN D) 1000 units tablet [id number] Yes Take 2,000 Units by mouth daily. [provider] Taking Active   conjugated estrogens (PREMARIN) vaginal cream [id number] Yes Place 1 Applicatorful vaginally daily. Apply 0.79m (pea-sized amount)  just inside the vaginal introitus with a finger-tip on  Monday, Wednesday and Friday nights.  Patient taking differently: Place 1 Applicatorful vaginally as needed. Apply 0.571m(pea-sized amount)  just inside the vaginal introitus with a finger-tip on  Monday, Wednesday and Friday nights.   McNori RiisPA-C Taking Active   CRANBERRY PO [id number] Take 2 tablets by mouth every morning.  [provider] Taking Active Self  ezetimibe (ZETIA) 10 MG tablet [id number] Take 1 tablet (10 mg total) by mouth daily. GuRia BushMD Taking Active   hydrochlorothiazide (MICROZIDE) 12.5 MG capsule [id number] Take 1 capsule (12.5 mg total) by mouth daily. GuRia BushMD Taking Active   losartan (COZAAR) 100 MG tablet [id number] TAKE 1 TABLET BY MOUTH DAILY GuRia BushMD Taking Active   metoprolol tartrate (LOPRESSOR) 25 MG tablet [id number] TAKE 1 TABLET BY MOUTH TWICE A DAVelora HecklerMD Taking Active   mirtazapine (REMERON) 15 MG tablet [id number] TAKE ONE TABLET BY MOUTH AT BEDTIME GuRia BushMD Taking Active   Multiple Vitamins-Minerals (PRESERVISION AREDS 2) CAPS [id number] Take 2 capsules by mouth daily. GuRia BushMD Taking Active   pravastatin (PRAVACHOL) 80 MG tablet [id number] TAKE 1 TABLET BY MOUTH EVERYDAY AT BEDTIME GuRia BushMD Taking Active   predniSONE (DELTASONE) 10 MG tablet [id number] Take 4 tablets (40 mg total) by mouth daily with breakfast. 10 days  Patient taking differently: Take 10 mg by mouth daily with breakfast.   KaFlora LippsMD Taking Active   PROAIR HFA 108 ([phone]) MCG/ACT inhaler [id number] TAKE 2 PUFFS BY MOUTH EVERY 4 HOURS AS NEEDED  GuRia BushMD Taking Active   sulfamethoxazole-trimethoprim (BACTRIM DS) 800-160 MG tablet [id number] Take 1 tablet by mouth 2 (two) times daily. GuRia BushMD Taking Active   tiotropium ([facility] MCG inhalation capsule [id number] Place 1 capsule (18 mcg total) into  inhaler and inhale daily. Ria Bush, MD Taking Active             Patient Active Problem List   Diagnosis Date Noted   Pedal edema 04/28/2021   Pain in metatarsus of left foot 08/12/2020   Right shoulder pain 06/17/2020   Abdominal aortic atherosclerosis ([place]) 08/25/2019   Right iliac artery stenosis (H[facility]) 08/25/2019   GERD (gastroesophageal reflux disease) 04/03/2019   Chronic diarrhea 03/05/2019   PAD (peripheral artery disease) ([place]) 01/05/2019   Epidermal cyst 02/01/2018   Neck pain 02/01/2018   Weight loss, unintentional 12/29/2017   Hoarseness, persistent 12/29/2017   Dysphagia 11/17/2017   Orthostatic syncope 09/21/2016   Trigeminal neuralgia of right side of face 11/18/2015   COPD exacerbation ([place]) 07/29/2015   CAD (coronary artery disease) 03/04/2015   Health maintenance examination 03/04/2015   MAI (mycobacterium avium-intracellulare) ([place]) 10/23/2014   Hx of colonic polyps 04/23/2014   Advanced care planning/counseling discussion 02/18/2014   Allergic rhinitis 02/05/2014   MRSA colonization 02/05/2014   Urge incontinence of urine 03/23/2013   Recurrent UTI 07/27/2012   Chronic respiratory failure with hypoxia ([place]) 04/27/2012   Medicare annual wellness visit, subsequent 09/10/2011   Dyslipidemia 09/01/2011   Osteopenia 08/23/2011   Chronic fatigue 06/19/2008   GOLD 4 COPD with emphysema 02/01/2008   ANXIETY 11/21/2006   MDD (major depressive disorder), recurrent episode, moderate ([place]) 11/21/2006   Essential hypertension 11/21/2006   Hyperglycemia 11/21/2006    Immunization History  Administered Date(s) Administered   Fluad Quad(high Dose 65+) 03/05/2019    Influenza Split 03/27/2012   Influenza Whole 02/21/2006, 02/21/2008, 02/22/2011   Influenza, High Dose Seasonal PF 03/05/2021   Influenza,inj,Quad PF,6+ Mos 02/16/2013, 02/18/2014, 03/04/2015, 01/21/2016, 06/15/2016, 02/01/2018   Influenza-Unspecified 04/10/2020   Moderna Sars-Covid-2 Vaccination 07/10/2019, 08/07/2019, 04/10/2020   Pfizer Covid-19 Vaccine Bivalent Booster 79yr & up 03/05/2021   Pneumococcal Conjugate-13 02/18/2014   Pneumococcal Polysaccharide-23 02/10/2009   Td 05/25/1998, 09/18/2008   Zoster Recombinat (Shingrix) 04/03/2019   Zoster, Live 11/13/2012    Conditions to be addressed/monitored:  Hypertension, Hyperlipidemia, Coronary Artery Disease, COPD, Depression, Anxiety, and Osteopenia  Care Plan : [place] Updates made by FCharlton Haws [place] 05/29/2021 12:00 AM     Problem: Hypertension, Hyperlipidemia, Coronary Artery Disease, COPD, Depression, Anxiety, and Osteopenia   Priority: High     Long-Range Goal: Disease management   Start Date: 03/04/2021  Expected End Date: 03/04/2022  This Visit's Progress: On track  Recent Progress: On track  Priority: High  Note:   Current Barriers:  Unable to independently monitor therapeutic efficacy  Pharmacist Clinical Goal(s):  Patient will achieve adherence to monitoring guidelines and medication adherence to achieve therapeutic efficacy  Interventions: 1:1 collaboration with GRia Bush MD regarding development and update of comprehensive plan of care as evidenced by provider attestation and co-signature Inter-disciplinary care team collaboration (see longitudinal plan of care) Comprehensive medication review performed; medication list updated in electronic medical record  Hypertension (BP goal <140/90) -Improved:  BP has improved since addition of HCTZ last month (previously elevated in s/o prednisone and leg swelling); pt reports leg swelling is also much improved -Current home  readings: 124/71 -Current treatment: Losartan 100 mg daily AM - Appropriate, Effective, Safe, Accessible Metoprolol tartrate 25 mg BID - Appropriate, Effective, Safe, Accessible HCTZ 12.5 mg daily - Appropriate, Effective, Safe, Accessible -Medications previously tried: furosemide  -Current exercise habits: limited due to COPD -Denies hypotensive/hypertensive symptoms -Educated on BP goals and benefits  of medications for prevention of heart attack, stroke and kidney damage; Importance of home blood pressure monitoring; -Counseled on prednisone risk for increasing BP -Advised to monitor BP at home daily -Recommend to continue current medicatio  Hyperlipidemia / CAD (LDL goal < 70) -Controlled - LDL is at goal; pt endorses compliance with statin and has restarted ezetimibe after several months off due to pharmacy failure to refill -Hx CAD, PAD, aortic atherosclerosis -Current treatment: Pravastatin 80 mg daily - Appropriate, Effective, Safe, Accessible Ezetimibe 10 mg daily - Appropriate, Effective, Safe, Accessible -Medications previously tried: none  -Educated on Cholesterol goals; Benefits of statin for ASCVD risk reduction; -Recommend to continue current medication  COPD (Goal: control symptoms and prevent exacerbations) -Improving- pt reports feeling better since being on chronic prednisone -Gold Grade: Gold 4 (FEV1<40%) -Current COPD Classification:  D (high sx, >/=2 exacerbations/yr) -MMRC/CAT score: not on file -Pulmonary function testing: December 2013 simple spirometry ratio 39%, FEV1 0.82 L (30% predicted) -Exacerbations requiring treatment in last 6 months: 2 -Current treatment  Spriva Handihaler 1 puff daily - Appropriate, Effective, Safe, Accessible Budesonide 0.5 mg via nebulizer BID - Appropriate, Effective, Safe, Accessible Brovana (arformoterol) 15 mcg via nebuilzer BID - Appropriate, Effective, Safe, Accessible Albuterol 0.083% via nebulizer PRN - Appropriate,  Effective, Safe, Accessible Proair PRN - Appropriate, Effective, Safe, Accessible Oxygen 4 L continuously Prednisone 10 mg daily - Appropriate, Effective, Safe, Accessible -Patient reports consistent use of maintenance inhaler -Frequency of rescue inhaler use: albuterol nebuilizer 2-3 times daily; albuterol HFA periodically when outside of home -Counseled on Proper inhaler technique;Benefits of consistent maintenance inhaler use -Educated on risks of chronic steroid use - elevated BG, BG, weight gain, and bone loss; advised pt to monitor BP at home regularly -Recommended to continue current medication, f/u with pulmonary regularly  Depression/Anxiety (Goal: manage symptoms) -Improved - pt reports feeling much better since last month; she is taking medications as prescribed -Current treatment: Bupropion SR 150 mg BID- Appropriate, Effective, Safe, Accessible Mirtazapine 15 mg HS - Appropriate, Effective, Safe, Accessible -Medications previously tried/failed: sertraline (diarrhea) -Connected with PCP for mental health support -Educated on Benefits of medication for symptom control;Benefits of cognitive-behavioral therapy with or without medication; pt declined to start therapy today -Counseled to take PM dose of bupropion in afternoon/before dinner -Recommend to continue current medication   Osteopenia (Goal prevent fractures) -Controlled- pt is not taking calcium and is not able to exercise due to COPD; she had a fall Sept 2022 and saw an orthopedist who found no fractures; she also recently started daily prednisone; she endorses compliance with new alendronate weekly, usually Fridays sometimes Saturdays if she forgets -Last DEXA Scan: 09/2018  T-Score femoral neck: -1.7  T-Score lumbar spine: -0.6  10-year probability of major osteoporotic fracture: 29.4%  10-year probability of hip fracture: 15.4% -Patient is a candidate for pharmacologic treatment due to T-Score -1.0 to -2.5 and 10-year  risk of major osteoporotic fracture > 20% and T-Score -1.0 to -2.5 and 10-year risk of hip fracture > 3% -Current treatment  Vitamin D 2000 IU daily Alendronate 70 mg weekly - Appropriate, Effective, Safe, Accessible -Recommend 1200 mg of calcium daily from dietary and supplemental sources.  -Counseled on benefits of bisphosphonate and administration once weekly on empty stomach 30 min before food/other medications, remain upright x 30 mins -Recommend to continue current medication  Health Maintenance -Vaccine gaps:Shingrix #2 -Not addressed today  Patient Goals/Self-Care Activities Patient will:  - take medications as prescribed -focus on medication adherence by pill  box -check blood pressure daily         Medication Assistance: None required.  Patient affirms current coverage meets needs.  Compliance/Adherence/Medication fill history: Care Gaps: Hep C screening Mammogram (04/12/20)  Star-Rating Drugs: Losartan - LF 03/17/21 x 90 ds; PDC 100% Pravastatin - LF 04/01/21 x 90 ds; [place] 100%  Patient's preferred pharmacy is:  [facility] [address] [place] [address] Phone: [phone] Fax: [phone]  Uses pill box? Yes Pt endorses 100% compliance  We discussed: Current pharmacy is preferred with insurance plan and patient is satisfied with pharmacy services; [facility] delivers her prescriptions Patient decided to: Continue current medication management strategy  Care Plan and Follow Up Patient Decision:  Patient agrees to Care Plan and Follow-up.  Plan: Telephone follow up appointment with care management team member scheduled for:  3 months  [name], PharmD, [facility] Clinical Pharmacist [facility] [phone]

## 2021-05-29 NOTE — Patient Instructions (Signed)
Visit Information  Phone number for Pharmacist: [phone]   Goals Addressed             This Visit's Progress    Track and Manage My Blood Pressure-Hypertension       Timeframe:  Long-Range Goal Priority:  High Start Date:   04/27/21                          Expected End Date:  04/27/22                     Follow Up Date April 2023   - check blood pressure daily - choose a place to take my blood pressure (home, clinic or office, retail store) - write blood pressure results in a log or diary    Why is this important?   You won't feel high blood pressure, but it can still hurt your blood vessels.  High blood pressure can cause heart or kidney problems. It can also cause a stroke.  Making lifestyle changes like losing a little weight or eating less salt will help.  Checking your blood pressure at home and at different times of the day can help to control blood pressure.  If the doctor prescribes medicine remember to take it the way the doctor ordered.  Call the office if you cannot afford the medicine or if there are questions about it.     Notes:         Care Plan : [place]  Updates made by Charlton Haws, RPH since 05/29/2021 12:00 AM     Problem: Hypertension, Hyperlipidemia, Coronary Artery Disease, COPD, Depression, Anxiety, and Osteopenia   Priority: High     Long-Range Goal: Disease management   Start Date: 03/04/2021  Expected End Date: 03/04/2022  This Visit's Progress: On track  Recent Progress: On track  Priority: High  Note:   Current Barriers:  Unable to independently monitor therapeutic efficacy  Pharmacist Clinical Goal(s):  Patient will achieve adherence to monitoring guidelines and medication adherence to achieve therapeutic efficacy  Interventions: 1:1 collaboration with Ria Bush, MD regarding development and update of comprehensive plan of care as evidenced by provider attestation and  co-signature Inter-disciplinary care team collaboration (see longitudinal plan of care) Comprehensive medication review performed; medication list updated in electronic medical record  Hypertension (BP goal <140/90) -Improved:  BP has improved since addition of HCTZ last month (previously elevated in s/o prednisone and leg swelling); pt reports leg swelling is also much improved -Current home readings: 124/71 -Current treatment: Losartan 100 mg daily AM - Appropriate, Effective, Safe, Accessible Metoprolol tartrate 25 mg BID - Appropriate, Effective, Safe, Accessible HCTZ 12.5 mg daily - Appropriate, Effective, Safe, Accessible -Medications previously tried: furosemide  -Current exercise habits: limited due to COPD -Denies hypotensive/hypertensive symptoms -Educated on BP goals and benefits of medications for prevention of heart attack, stroke and kidney damage; Importance of home blood pressure monitoring; -Counseled on prednisone risk for increasing BP -Advised to monitor BP at home daily -Recommend to continue current medicatio  Hyperlipidemia / CAD (LDL goal < 70) -Controlled - LDL is at goal; pt endorses compliance with statin and has restarted ezetimibe after several months off due to pharmacy failure to refill -Hx CAD, PAD, aortic atherosclerosis -Current treatment: Pravastatin 80 mg daily - Appropriate, Effective, Safe, Accessible Ezetimibe 10 mg daily - Appropriate, Effective, Safe, Accessible -Medications previously tried: none  -Educated on Cholesterol goals; Benefits of  statin for ASCVD risk reduction; -Recommend to continue current medication  COPD (Goal: control symptoms and prevent exacerbations) -Improving- pt reports feeling better since being on chronic prednisone -Gold Grade: Gold 4 (FEV1<40%) -Current COPD Classification:  D (high sx, >/=2 exacerbations/yr) -MMRC/CAT score: not on file -Pulmonary function testing: December 2013 simple spirometry ratio 39%, FEV1  0.82 L (30% predicted) -Exacerbations requiring treatment in last 6 months: 2 -Current treatment  Spriva Handihaler 1 puff daily - Appropriate, Effective, Safe, Accessible Budesonide 0.5 mg via nebulizer BID - Appropriate, Effective, Safe, Accessible Brovana (arformoterol) 15 mcg via nebuilzer BID - Appropriate, Effective, Safe, Accessible Albuterol 0.083% via nebulizer PRN - Appropriate, Effective, Safe, Accessible Proair PRN - Appropriate, Effective, Safe, Accessible Oxygen 4 L continuously Prednisone 10 mg daily - Appropriate, Effective, Safe, Accessible -Patient reports consistent use of maintenance inhaler -Frequency of rescue inhaler use: albuterol nebuilizer 2-3 times daily; albuterol HFA periodically when outside of home -Counseled on Proper inhaler technique;Benefits of consistent maintenance inhaler use -Educated on risks of chronic steroid use - elevated BG, BG, weight gain, and bone loss; advised pt to monitor BP at home regularly -Recommended to continue current medication, f/u with pulmonary regularly  Depression/Anxiety (Goal: manage symptoms) -Improved - pt reports feeling much better since last month; she is taking medications as prescribed -Current treatment: Bupropion SR 150 mg BID- Appropriate, Effective, Safe, Accessible Mirtazapine 15 mg HS - Appropriate, Effective, Safe, Accessible -Medications previously tried/failed: sertraline (diarrhea) -Connected with PCP for mental health support -Educated on Benefits of medication for symptom control;Benefits of cognitive-behavioral therapy with or without medication; pt declined to start therapy today -Counseled to take PM dose of bupropion in afternoon/before dinner -Recommend to continue current medication   Osteopenia (Goal prevent fractures) -Controlled- pt is not taking calcium and is not able to exercise due to COPD; she had a fall Sept 2022 and saw an orthopedist who found no fractures; she also recently started daily  prednisone; she endorses compliance with new alendronate weekly, usually Fridays sometimes Saturdays if she forgets -Last DEXA Scan: 09/2018  T-Score femoral neck: -1.7  T-Score lumbar spine: -0.6  10-year probability of major osteoporotic fracture: 29.4%  10-year probability of hip fracture: 15.4% -Patient is a candidate for pharmacologic treatment due to T-Score -1.0 to -2.5 and 10-year risk of major osteoporotic fracture > 20% and T-Score -1.0 to -2.5 and 10-year risk of hip fracture > 3% -Current treatment  Vitamin D 2000 IU daily Alendronate 70 mg weekly - Appropriate, Effective, Safe, Accessible -Recommend 1200 mg of calcium daily from dietary and supplemental sources.  -Counseled on benefits of bisphosphonate and administration once weekly on empty stomach 30 min before food/other medications, remain upright x 30 mins -Recommend to continue current medication  Health Maintenance -Vaccine gaps:Shingrix #2 -Not addressed today  Patient Goals/Self-Care Activities Patient will:  - take medications as prescribed -focus on medication adherence by pill box -check blood pressure daily       Patient verbalizes understanding of instructions provided today and agrees to view in [place].  Telephone follow up appointment with pharmacy team member scheduled for: 3 months  [name], PharmD, [facility] Clinical Pharmacist [facility] [phone]

## 2021-06-10 NOTE — Progress Notes (Signed)
Subjective:   [name] [name] is a 81 y.o. female who presents for Medicare Annual (Subsequent) preventive examination.  I connected with Hermena Swint today by telephone and verified that I am speaking with the correct person using two identifiers. Location patient: home Location provider: work Persons participating in the virtual visit: patient, Marine scientist.    I discussed the limitations, risks, security and privacy concerns of performing an evaluation and management service by telephone and the availability of in person appointments. I also discussed with the patient that there may be a patient responsible charge related to this service. The patient expressed understanding and verbally consented to this telephonic visit.    Interactive audio and video telecommunications were attempted between this provider and patient, however failed, due to patient having technical difficulties OR patient did not have access to video capability.  We continued and completed visit with audio only.  Some vital signs may be absent or patient reported.   Time Spent with patient on telephone encounter: 25 minutes  Review of Systems     Cardiac Risk Factors include: advanced age (>20men, >26 women);hypertension;dyslipidemia     Objective:    Today's Vitals   06/11/21 1444  Weight: 128 lb (58.1 kg)  Height: 5\' 4"  (1.626 m)   Body mass index is 21.97 kg/m.  Advanced Directives 06/11/2021 06/10/2020 01/24/2020 03/27/2019 03/16/2018 08/12/2016 04/30/2014  Does Patient Have a Medical Advance Directive? Yes Yes Yes Yes Yes No No  Type of Paramedic of [place];Living will [place];Living will [place];Living will [place];Living will [place];Living will - -  Does patient want to make changes to medical advance directive? Yes (MAU/Ambulatory/Procedural Areas - Information given) - - - - - -  Copy of  [place] in Chart? Yes - validated most recent copy scanned in chart (See row information) Yes - validated most recent copy scanned in chart (See row information) No - copy requested No - copy requested No - copy requested - -  Would patient like information on creating a medical advance directive? - - - - - - -    Current Medications (verified) Outpatient Encounter Medications as of 06/11/2021  Medication Sig   albuterol (PROVENTIL) (2.5 MG/3ML) 0.083% nebulizer solution USE ONE VIAL IN NEBULIZER DAILY AS NEEDED FOR RESCUE   alendronate (FOSAMAX) 70 MG tablet Take 1 tablet (70 mg total) by mouth once a week. Take with a full glass of water on an empty stomach.   arformoterol (BROVANA) 15 MCG/2ML NEBU USE 1 VIAL  IN  NEBULIZER TWICE  DAILY - (Morning And Evening)   budesonide (PULMICORT) 0.5 MG/2ML nebulizer solution INHALE 1 VIAL VIA NEBULIZER TWICE DAILY   buPROPion (WELLBUTRIN SR) 150 MG 12 hr tablet TAKE ONE TABLET BY MOUTH TWICE DAILY   cholecalciferol (VITAMIN D) 1000 units tablet Take 2,000 Units by mouth daily.   conjugated estrogens (PREMARIN) vaginal cream Place 1 Applicatorful vaginally daily. Apply 0.5mg  (pea-sized amount)  just inside the vaginal introitus with a finger-tip on  Monday, Wednesday and Friday nights. (Patient taking differently: Place 1 Applicatorful vaginally as needed. Apply 0.5mg  (pea-sized amount)  just inside the vaginal introitus with a finger-tip on  Monday, Wednesday and Friday nights.)   CRANBERRY PO Take 2 tablets by mouth every morning.    ezetimibe (ZETIA) 10 MG tablet Take 1 tablet (10 mg total) by mouth daily.   hydrochlorothiazide (MICROZIDE) 12.5 MG capsule Take 1  capsule (12.5 mg total) by mouth daily.   losartan (COZAAR) 100 MG tablet TAKE 1 TABLET BY MOUTH DAILY   metoprolol tartrate (LOPRESSOR) 25 MG tablet TAKE 1 TABLET BY MOUTH TWICE A DAY   mirtazapine (REMERON) 15 MG tablet TAKE ONE TABLET BY MOUTH AT BEDTIME   Multiple  Vitamins-Minerals (PRESERVISION AREDS 2) CAPS Take 2 capsules by mouth daily.   pravastatin (PRAVACHOL) 80 MG tablet TAKE 1 TABLET BY MOUTH EVERYDAY AT BEDTIME   predniSONE (DELTASONE) 10 MG tablet Take 4 tablets (40 mg total) by mouth daily with breakfast. 10 days (Patient taking differently: Take 10 mg by mouth daily with breakfast.)   PROAIR HFA 108 (90 Base) MCG/ACT inhaler TAKE 2 PUFFS BY MOUTH EVERY 4 HOURS AS NEEDED   sulfamethoxazole-trimethoprim (BACTRIM DS) 800-160 MG tablet Take 1 tablet by mouth 2 (two) times daily.   tiotropium (SPIRIVA) 18 MCG inhalation capsule Place 1 capsule (18 mcg total) into inhaler and inhale daily.   No facility-administered encounter medications on file as of 06/11/2021.    Allergies (verified) Keflex [cephalexin] and Penicillins   History: Past Medical History:  Diagnosis Date   Chronic cystitis    recurrent UTI (4+ in last year), on suppressive doxy (uro Cope)   Colon polyp    COPD, severe 01/27/07       Depression with anxiety    Diverticular disease 10/09/09   CT abd no sigmoid mass DDD L5/S1   Dyslipidemia    GERD (gastroesophageal reflux disease)    Hematuria    h/o kidney stones   Hiatal hernia    Hypertension    Left scapholunate ligament tear 2014   found on imaging   Lung cancer ([place]) 1987   Partial RLL Lobectomy.   Osteoarthritis    Osteopenia 08/2011   DEXA: L femur -1.6   Peptic ulcer    Spinal abscess ([place]) 2000   secondary to periodontal disease   Past Surgical History:  Procedure Laterality Date   BLADDER SURGERY     BREAST BIOPSY Left 03/99   neg   CHOLECYSTECTOMY  02/10/09   COLONOSCOPY WITH PROPOFOL N/A 04/30/2014   Procedure: COLONOSCOPY WITH PROPOFOL;  Surgeon: Irene Shipper, MD;  Location: [facility] ENDOSCOPY;  Service: Endoscopy;  Laterality: N/A;   CYSTOSCOPY  02/11/07 and 09/25/08   Dr. Jacqlyn Larsen, normal   CYSTOSCOPY  07/2019   WNL Erlene Quan)   DEXA  08/2011   T femur -1.6, T spine -0.6   MOUTH SURGERY  2000   [facility]osp  [facility] pneumonia sepsis due to gum surgery   OOPHORECTOMY     REPLACEMENT TOTAL KNEE  09/2009   Right   TOTAL ABDOMINAL HYSTERECTOMY  07/18/01   benign reason, BSO, vag repair secondary prolapse   TUBAL LIGATION     bilateral   Family History  Problem Relation Age of Onset   Pancreatitis Mother    Heart disease Father    Hypertension Father    Emphysema Father    Arthritis Brother    Hypertension Brother    Cancer Maternal Aunt        ovarian cancer   Kidney disease Neg Hx    Prostate cancer Neg Hx    Bladder Cancer Neg Hx    Social History   Socioeconomic History   Marital status: Widowed    Spouse name: Not on file   Number of children: 2   Years of education: Not on file   Highest education level: Not on file  Occupational  History   Occupation: Retired    Fish farm manager: RETIRED    Comment: prior Glass blower/designer at [employer] Use   Smoking status: Former    Packs/day: 3.00    Years: 34.00    Pack years: 102.00    Types: Cigarettes    Quit date: 05/29/1995    Years since quitting: 26.0   Smokeless tobacco: Never  Vaping Use   Vaping Use: Never used  Substance and Sexual Activity   Alcohol use: Yes    Alcohol/week: 0.0 standard drinks    Comment: wine occasionally   Drug use: No   Sexual activity: Not Currently  Other Topics Concern   Not on file  Social History Narrative   Caffeine: 4 cups coffee   L[facility] in independent living at [facility] time, 2nd husband died of heart attack. Two stepsons. 3rd husband died summer 2018   Mother of [name] Millet   Activity: Walks for exercise   Diet: good water, fruits/vegetables daily      Advanced directed - would want HCPOA to be husband, Karlton Lemon. Does not have set up - requests handout.   Social Determinants of Health   Financial Resource Strain: Low Risk    Difficulty of Paying Living Expenses: Not hard at all  Food Insecurity: No Food Insecurity   Worried About Paediatric nurse in the Last Year: Never true   [place] in the Last Year: Never true  Transportation Needs: No Transportation Needs   Lack of Transportation (Medical): No   Lack of Transportation (Non-Medical): No  Physical Activity: Inactive   Days of Exercise per Week: 0 days   Minutes of Exercise per Session: 0 min  Stress: No Stress Concern Present   Feeling of Stress : Not at all  Social Connections: Moderately Isolated   Frequency of Communication with Friends and Family: More than three times a week   Frequency of Social Gatherings with Friends and Family: More than three times a week   Attends Religious Services: More than 4 times per year   Active Member of [employer] or Organizations: No   Attends Archivist Meetings: Never   Marital Status: Widowed    Tobacco Counseling Counseling given: Not Answered   Clinical Intake:  Pre-visit preparation completed: Yes  Pain : No/denies pain     BMI - recorded: 21.97 Nutritional Status: BMI of 19-24  Normal Nutritional Risks: Unintentional weight gain (taking prednisone) Diabetes: No  How often do you need to have someone help you when you read instructions, pamphlets, or other written materials from your doctor or pharmacy?: 1 - Never  Diabetic? No  Interpreter Needed?: No  Information entered by :: Orrin Brigham LPN   Activities of Daily Living In your present state of health, do you have any difficulty performing the following activities: 06/11/2021  Hearing? N  Vision? N  Difficulty concentrating or making decisions? N  Walking or climbing stairs? Y  Dressing or bathing? N  Doing errands, shopping? Y  Comment son or daughter [place] and eating ? N  Using the Toilet? N  In the past six months, have you accidently leaked urine? Y  Do you have problems with loss of bowel control? N  Managing your Medications? N  Managing your Finances? N  Housekeeping or managing your Housekeeping? N   Some recent data might be hidden    Patient Care Team: Ria Bush, MD  as PCP - General (Family Medicine) Minna Merritts, MD as Consulting Physician (Cardiology) Charlton Haws, [facility] as Pharmacist (Pharmacist)  Indicate any recent Medical Services you may have received from other than [facility] providers in the past year (date may be approximate).     Assessment:   This is a routine wellness examination for [name] [name].  Hearing/Vision screen Hearing Screening - Comments:: No issues Vision Screening - Comments:: Last exam over a year ago, Dr. Percell Boston, wears glasses  Dietary issues and exercise activities discussed: Current Exercise Habits: The patient does not participate in regular exercise at present   Goals Addressed             This Visit's Progress    Patient Stated       Would like to maintain current routine       Depression Screen PHQ 2/9 Scores 06/11/2021 04/28/2021 06/10/2020 03/27/2019 03/16/2018 08/12/2016 03/10/2016  PHQ - 2 Score 0 4 2 0 0 0 0  PHQ- 9 Score - 14 3 0 5 - 2    Fall Risk Fall Risk  06/11/2021 06/10/2020 04/16/2019 03/27/2019 03/16/2018  Falls in the past year? 0 0 1 0 Yes  Comment - - Emmi Telephone Survey: data to providers prior to load - 2-3 falls due to loss of balance  Number falls in past yr: 0 0 1 0 2 or more  Comment - - Emmi Telephone Survey Actual Response = 1 - -  Injury with Fall? 0 0 1 0 No  Risk Factor Category  - - - - High Fall Risk  Risk for fall due to : No Fall Risks Medication side effect - Medication side effect History of fall(s);Impaired balance/gait  Follow up Falls prevention discussed Falls evaluation completed;Falls prevention discussed - Falls evaluation completed;Falls prevention discussed -    FALL RISK PREVENTION PERTAINING TO THE HOME:  Any stairs in or around the home? Yes  If so, are there any without handrails? No  Home free of loose throw rugs in walkways, pet beds, electrical cords, etc? Yes   Adequate lighting in your home to reduce risk of falls? Yes   ASSISTIVE DEVICES UTILIZED TO PREVENT FALLS:  Life alert? No  Use of a cane, walker or w/c? Yes , walker  Grab bars in the bathroom? Yes  Shower chair or bench in shower? Yes  Elevated toilet seat or a handicapped toilet? Yes   TIMED UP AND GO:  Was the test performed? No .    Cognitive Function: Normal cognitive status assessed by this Nurse Health Advisor. No abnormalities found.   MMSE - Mini Mental State Exam 06/10/2020 03/27/2019 03/16/2018 08/12/2016  Orientation to time 4 5 5 5   Orientation to Place 5 5 5 5   Registration 3 3 3 3   Attention/ Calculation 5 5 0 0  Recall 3 3 3 2   Recall-comments - - - pt was unable to recall 1 of 3 words  Language- name 2 objects - - 0 0  Language- repeat 1 1 1 1   Language- follow 3 step command - - 3 3  Language- read & follow direction - - 0 0  Write a sentence - - 0 0  Copy design - - 0 0  Total score - - 20 19        Immunizations Immunization History  Administered Date(s) Administered   Fluad Quad(high Dose 65+) 03/05/2019   Influenza Split 03/27/2012   Influenza Whole 02/21/2006, 02/21/2008, 02/22/2011   Influenza, High Dose  Seasonal PF 03/05/2021   Influenza,inj,Quad PF,6+ Mos 02/16/2013, 02/18/2014, 03/04/2015, 01/21/2016, 06/15/2016, 02/01/2018   Influenza-Unspecified 04/10/2020   Moderna Sars-Covid-2 Vaccination 07/10/2019, 08/07/2019, 04/10/2020   Pfizer Covid-19 Vaccine Bivalent Booster 62yrs & up 03/05/2021   Pneumococcal Conjugate-13 02/18/2014   Pneumococcal Polysaccharide-23 02/10/2009   Td 05/25/1998, 09/18/2008   Zoster Recombinat (Shingrix) 04/03/2019   Zoster, Live 11/13/2012    TDAP status: Due, Education has been provided regarding the importance of this vaccine. Advised may receive this vaccine at local pharmacy or Health Dept. Aware to provide a copy of the vaccination record if obtained from local pharmacy or Health Dept. Verbalized  acceptance and understanding.  Flu Vaccine status: Up to date  Pneumococcal vaccine status: Up to date  Covid-19 vaccine status: Completed vaccines  Qualifies for Shingles Vaccine? Yes   Zostavax completed Yes   Shingrix Completed?: Yes  Screening Tests Health Maintenance  Topic Date Due   Zoster Vaccines- Shingrix (2 of 2) 05/29/2019   MAMMOGRAM  04/12/2020   TETANUS/TDAP  06/11/2023 (Originally 09/19/2018)   Pneumonia Vaccine 37+ Years old  Completed   INFLUENZA VACCINE  Completed   DEXA SCAN  Completed   COVID-19 Vaccine  Completed   HPV VACCINES  Aged Out    Health Maintenance  Health Maintenance Due  Topic Date Due   Zoster Vaccines- Shingrix (2 of 2) 05/29/2019   MAMMOGRAM  04/12/2020    Colorectal cancer screening: No longer required.   Mammogram status: due, last completed 04/13/19, patient plans to discuss with PCP  Bone Density status: due, last completed 10/04/16, patient plans to discuss with PCP  Lung Cancer Screening: (Low Dose CT Chest recommended if Age 25-80 years, 30 pack-year currently smoking OR have quit w/in 15years.) does qualify. Patient will discuss with PCP    Additional Screening:  Hepatitis C Screening: does not qualify  Vision Screening: Recommended annual ophthalmology exams for early detection of glaucoma and other disorders of the eye. Is the patient up to date with their annual eye exam?  Yes  Who is the provider or what is the name of the office in which the patient attends annual eye exams? Dr. Percell Boston    Dental Screening: Recommended annual dental exams for proper oral hygiene  Community Resource Referral / Chronic Care Management: CRR required this visit?  No   CCM required this visit?  No      Plan:     I have personally reviewed and noted the following in the patients chart:   Medical and social history Use of alcohol, tobacco or illicit drugs  Current medications and supplements including opioid  prescriptions.  Functional ability and status Nutritional status Physical activity Advanced directives List of other physicians Hospitalizations, surgeries, and ER visits in previous 12 months Vitals Screenings to include cognitive, depression, and falls Referrals and appointments  In addition, I have reviewed and discussed with patient certain preventive protocols, quality metrics, and best practice recommendations. A written personalized care plan for preventive services as well as general preventive health recommendations were provided to patient.   Due to this being a telephonic visit, the after visit summary with patients personalized plan was offered to patient via mail or my-chart. Patient would like to access on my-chart.  Loma Messing, LPN   [id number]   Nurse Health Advisor  Nurse Notes: none

## 2021-06-11 ENCOUNTER — Ambulatory Visit (INDEPENDENT_AMBULATORY_CARE_PROVIDER_SITE_OTHER): Payer: PPO

## 2021-06-11 VITALS — Ht 64.0 in | Wt 128.0 lb

## 2021-06-11 DIAGNOSIS — Z0000 Encounter for general adult medical examination without abnormal findings: Secondary | ICD-10-CM

## 2021-06-11 NOTE — Patient Instructions (Signed)
[name] , Thank you for taking time to complete your Medicare Wellness Visit. I appreciate your ongoing commitment to your health goals. Please review the following plan we discussed and let me know if I can assist you in the future.   Screening recommendations/referrals: Colonoscopy: no longer required  Mammogram: due, last completed 04/13/19, per our conversation discuss with PCP if you change your mind Bone Density: due, last completed 10/04/16, per our conversation discuss with PCP if you change your mind Recommended yearly ophthalmology/optometry visit for glaucoma screening and checkup Recommended yearly dental visit for hygiene and checkup  Vaccinations: Influenza vaccine: up to date Pneumococcal vaccine: up to date Tdap vaccine: due, last completed 09/18/08, Discuss with pharmacy Shingles vaccine: up to date   Covid-19:up to date  Advanced directives: copy in chart  Conditions/risks identified: see problem list  Next appointment: Follow up in one year for your annual wellness visit 06/14/22 @ 3:30pm, this will be a telephone visit   Preventive Care 15 Years and Older, Female Preventive care refers to lifestyle choices and visits with your health care provider that can promote health and wellness. What does preventive care include? A yearly physical exam. This is also called an annual well check. Dental exams once or twice a year. Routine eye exams. Ask your health care provider how often you should have your eyes checked. Personal lifestyle choices, including: Daily care of your teeth and gums. Regular physical activity. Eating a healthy diet. Avoiding tobacco and drug use. Limiting alcohol use. Practicing safe sex. Taking low-dose aspirin every day. Taking vitamin and mineral supplements as recommended by your health care provider. What happens during an annual well check? The services and screenings done by your health care provider during your annual well check  will depend on your age, overall health, lifestyle risk factors, and family history of disease. Counseling  Your health care provider may ask you questions about your: Alcohol use. Tobacco use. Drug use. Emotional well-being. Home and relationship well-being. Sexual activity. Eating habits. History of falls. Memory and ability to understand (cognition). Work and work Statistician. Reproductive health. Screening  You may have the following tests or measurements: Height, weight, and BMI. Blood pressure. Lipid and cholesterol levels. These may be checked every 5 years, or more frequently if you are over 26 years old. Skin check. Lung cancer screening. You may have this screening every year starting at age 24 if you have a 30-pack-year history of smoking and currently smoke or have quit within the past 15 years. Fecal occult blood test (FOBT) of the stool. You may have this test every year starting at age 20. Flexible sigmoidoscopy or colonoscopy. You may have a sigmoidoscopy every 5 years or a colonoscopy every 10 years starting at age 16. Hepatitis C blood test. Hepatitis B blood test. Sexually transmitted disease (STD) testing. Diabetes screening. This is done by checking your blood sugar (glucose) after you have not eaten for a while (fasting). You may have this done every 1-3 years. Bone density scan. This is done to screen for osteoporosis. You may have this done starting at age 10. Mammogram. This may be done every 1-2 years. Talk to your health care provider about how often you should have regular mammograms. Talk with your health care provider about your test results, treatment options, and if necessary, the need for more tests. Vaccines  Your health care provider may recommend certain vaccines, such as: Influenza vaccine. This is recommended every year. Tetanus, diphtheria, and acellular pertussis (  Tdap, Td) vaccine. You may need a Td booster every 10 years. Zoster vaccine. You  may need this after age 18. Pneumococcal 13-valent conjugate (PCV13) vaccine. One dose is recommended after age 3. Pneumococcal polysaccharide (PPSV23) vaccine. One dose is recommended after age 64. Talk to your health care provider about which screenings and vaccines you need and how often you need them. This information is not intended to replace advice given to you by your health care provider. Make sure you discuss any questions you have with your health care provider. Document Released: 06/06/2015 Document Revised: 01/28/2016 Document Reviewed: 03/11/2015 Elsevier Interactive Patient Education  2017 [place] Prevention in the Home Falls can cause injuries. They can happen to people of all ages. There are many things you can do to make your home safe and to help prevent falls. What can I do on the outside of my home? Regularly fix the edges of walkways and driveways and fix any cracks. Remove anything that might make you trip as you walk through a door, such as a raised step or threshold. Trim any bushes or trees on the path to your home. Use bright outdoor lighting. Clear any walking paths of anything that might make someone trip, such as rocks or tools. Regularly check to see if handrails are loose or broken. Make sure that both sides of any steps have handrails. Any raised decks and porches should have guardrails on the edges. Have any leaves, snow, or ice cleared regularly. Use sand or salt on walking paths during winter. Clean up any spills in your garage right away. This includes oil or grease spills. What can I do in the bathroom? Use night lights. Install grab bars by the toilet and in the tub and shower. Do not use towel bars as grab bars. Use non-skid mats or decals in the tub or shower. If you need to sit down in the shower, use a plastic, non-slip stool. Keep the floor dry. Clean up any water that spills on the floor as soon as it happens. Remove soap buildup  in the tub or shower regularly. Attach bath mats securely with double-sided non-slip rug tape. Do not have throw rugs and other things on the floor that can make you trip. What can I do in the bedroom? Use night lights. Make sure that you have a light by your bed that is easy to reach. Do not use any sheets or blankets that are too big for your bed. They should not hang down onto the floor. Have a firm chair that has side arms. You can use this for support while you get dressed. Do not have throw rugs and other things on the floor that can make you trip. What can I do in the kitchen? Clean up any spills right away. Avoid walking on wet floors. Keep items that you use a lot in easy-to-reach places. If you need to reach something above you, use a strong step stool that has a grab bar. Keep electrical cords out of the way. Do not use floor polish or wax that makes floors slippery. If you must use wax, use non-skid floor wax. Do not have throw rugs and other things on the floor that can make you trip. What can I do with my stairs? Do not leave any items on the stairs. Make sure that there are handrails on both sides of the stairs and use them. Fix handrails that are broken or loose. Make sure that handrails are  as long as the stairways. Check any carpeting to make sure that it is firmly attached to the stairs. Fix any carpet that is loose or worn. Avoid having throw rugs at the top or bottom of the stairs. If you do have throw rugs, attach them to the floor with carpet tape. Make sure that you have a light switch at the top of the stairs and the bottom of the stairs. If you do not have them, ask someone to add them for you. What else can I do to help prevent falls? Wear shoes that: Do not have high heels. Have rubber bottoms. Are comfortable and fit you well. Are closed at the toe. Do not wear sandals. If you use a stepladder: Make sure that it is fully opened. Do not climb a closed  stepladder. Make sure that both sides of the stepladder are locked into place. Ask someone to hold it for you, if possible. Clearly mark and make sure that you can see: Any grab bars or handrails. First and last steps. Where the edge of each step is. Use tools that help you move around (mobility aids) if they are needed. These include: Canes. Walkers. Scooters. Crutches. Turn on the lights when you go into a dark area. Replace any light bulbs as soon as they burn out. Set up your furniture so you have a clear path. Avoid moving your furniture around. If any of your floors are uneven, fix them. If there are any pets around you, be aware of where they are. Review your medicines with your doctor. Some medicines can make you feel dizzy. This can increase your chance of falling. Ask your doctor what other things that you can do to help prevent falls. This information is not intended to replace advice given to you by your health care provider. Make sure you discuss any questions you have with your health care provider. Document Released: 03/06/2009 Document Revised: 10/16/2015 Document Reviewed: 06/14/2014 Elsevier Interactive Patient Education  2017 [employer].

## 2021-06-16 DIAGNOSIS — J449 Chronic obstructive pulmonary disease, unspecified: Secondary | ICD-10-CM | POA: Diagnosis not present

## 2021-06-19 ENCOUNTER — Other Ambulatory Visit: Payer: Self-pay | Admitting: Family Medicine

## 2021-06-23 DIAGNOSIS — E785 Hyperlipidemia, unspecified: Secondary | ICD-10-CM

## 2021-06-23 DIAGNOSIS — F331 Major depressive disorder, recurrent, moderate: Secondary | ICD-10-CM

## 2021-06-23 DIAGNOSIS — I10 Essential (primary) hypertension: Secondary | ICD-10-CM | POA: Diagnosis not present

## 2021-06-23 DIAGNOSIS — J439 Emphysema, unspecified: Secondary | ICD-10-CM | POA: Diagnosis not present

## 2021-06-29 ENCOUNTER — Other Ambulatory Visit: Payer: Self-pay | Admitting: Internal Medicine

## 2021-07-02 ENCOUNTER — Other Ambulatory Visit: Payer: Self-pay | Admitting: Family Medicine

## 2021-07-03 ENCOUNTER — Other Ambulatory Visit: Payer: Self-pay | Admitting: Family Medicine

## 2021-07-03 NOTE — Telephone Encounter (Signed)
Remeron Last filled:  06/09/21, #30 Last OV:  04/28/21, foot swelling; SOB Next OV:  08/05/21, AWV prt 2

## 2021-07-07 ENCOUNTER — Other Ambulatory Visit: Payer: Self-pay | Admitting: Family Medicine

## 2021-07-07 DIAGNOSIS — E785 Hyperlipidemia, unspecified: Secondary | ICD-10-CM

## 2021-07-08 DIAGNOSIS — J449 Chronic obstructive pulmonary disease, unspecified: Secondary | ICD-10-CM | POA: Diagnosis not present

## 2021-07-10 ENCOUNTER — Telehealth: Payer: Self-pay

## 2021-07-10 NOTE — Progress Notes (Signed)
Chronic Care Management Pharmacy Assistant   Name: [name] [name]  MRN: [id number] DOB: [birth year]  Reason for Encounter: CCM (Hypertension Disease State)   Recent office visits:  06/11/2021 - [name] Brigham, LPN - Patient presented for Annual Wellness Visit.   Recent consult visits:  None since last CCM contact  Hospital visits:  None in previous 6 months  Medications: Outpatient Encounter Medications as of 07/10/2021  Medication Sig   albuterol (PROVENTIL) (2.5 MG/3ML) 0.083% nebulizer solution USE ONE VIAL IN NEBULIZER DAILY AS NEEDED FOR RESCUE   alendronate (FOSAMAX) 70 MG tablet Take 1 tablet (70 mg total) by mouth once a week. Take with a full glass of water on an empty stomach.   arformoterol (BROVANA) 15 MCG/2ML NEBU USE 1 VIAL  IN  NEBULIZER TWICE  DAILY - (Morning And Evening)   budesonide (PULMICORT) 0.5 MG/2ML nebulizer solution INHALE 1 VIAL VIA NEBULIZER TWICE DAILY   buPROPion (WELLBUTRIN SR) 150 MG 12 hr tablet TAKE ONE TABLET BY MOUTH TWICE DAILY   cholecalciferol (VITAMIN D) 1000 units tablet Take 2,000 Units by mouth daily.   conjugated estrogens (PREMARIN) vaginal cream Place 1 Applicatorful vaginally daily. Apply 0.5mg  (pea-sized amount)  just inside the vaginal introitus with a finger-tip on  Monday, Wednesday and Friday nights. (Patient taking differently: Place 1 Applicatorful vaginally as needed. Apply 0.5mg  (pea-sized amount)  just inside the vaginal introitus with a finger-tip on  Monday, Wednesday and Friday nights.)   CRANBERRY PO Take 2 tablets by mouth every morning.    ezetimibe (ZETIA) 10 MG tablet TAKE 1 TABLET BY MOUTH DAILY   hydrochlorothiazide (MICROZIDE) 12.5 MG capsule Take 1 capsule (12.5 mg total) by mouth daily.   losartan (COZAAR) 100 MG tablet TAKE 1 TABLET BY MOUTH DAILY   metoprolol tartrate (LOPRESSOR) 25 MG tablet TAKE ONE TABLET BY MOUTH TWICE DAILY   mirtazapine (REMERON) 15 MG tablet TAKE ONE TABLET BY MOUTH AT BEDTIME    Multiple Vitamins-Minerals (PRESERVISION AREDS 2) CAPS Take 2 capsules by mouth daily.   pravastatin (PRAVACHOL) 80 MG tablet TAKE ONE TABLET BY MOUTH AT BEDTIME   predniSONE (DELTASONE) 10 MG tablet Take 4 tablets (40 mg total) by mouth daily with breakfast. 10 days (Patient taking differently: Take 10 mg by mouth daily with breakfast.)   PROAIR HFA 108 (90 Base) MCG/ACT inhaler TAKE 2 PUFFS BY MOUTH EVERY 4 HOURS AS NEEDED   sulfamethoxazole-trimethoprim (BACTRIM DS) 800-160 MG tablet Take 1 tablet by mouth 2 (two) times daily.   tiotropium (SPIRIVA) 18 MCG inhalation capsule Place 1 capsule (18 mcg total) into inhaler and inhale daily.   No facility-administered encounter medications on file as of 07/10/2021.   Recent Office Vitals: BP Readings from Last 3 Encounters:  04/28/21 (!) 152/90  02/25/21 122/80  02/12/21 (!) 148/67   Pulse Readings from Last 3 Encounters:  04/28/21 70  02/25/21 (!) 57  10/14/20 78    Wt Readings from Last 3 Encounters:  06/11/21 128 lb (58.1 kg)  04/28/21 128 lb 8 oz (58.3 kg)  02/25/21 124 lb 12.8 oz (56.6 kg)    Kidney Function Lab Results  Component Value Date/Time   CREATININE 0.91 04/28/2021 04:12 PM   CREATININE 0.81 06/11/2020 11:30 AM   CREATININE 0.71 07/24/2012 01:13 PM   GFR 59.76 (L) 04/28/2021 04:12 PM   GFRNONAA >60 01/24/2020 06:05 PM   GFRNONAA >60 07/24/2012 01:13 PM   GFRAA >60 01/24/2020 06:05 PM   GFRAA >60 07/24/2012 01:13 PM  BMP Latest Ref Rng & Units 04/28/2021 06/11/2020 05/19/2020  Glucose 70 - 99 mg/dL 112(H) 102(H) 86  BUN 6 - 23 mg/dL 17 9 11   Creatinine 0.40 - 1.20 mg/dL 0.91 0.81 1.03  Sodium 135 - 145 mEq/L 139 141 141  Potassium 3.5 - 5.1 mEq/L 4.7 4.5 4.1  Chloride 96 - 112 mEq/L 96 98 101  CO2 19 - 32 mEq/L 38(H) 40(H) 37(H)  Calcium 8.4 - 10.5 mg/dL 9.6 9.4 8.9   Contacted patient on 07/13/2021 to discuss hypertension disease state  Current antihypertensive regimen:  Losartan 100 mg daily AM Metoprolol  tartrate 25 mg BID HCTZ 12.5 mg daily  Patient verbally confirms she is taking the above medications as directed. Yes  How often are you checking your Blood Pressure? infrequently I have asked her to take her blood pressure daily starting today and I will call back on Friday 02/24 for her log. Patient understood and agreed.   Wrist or arm cuff: Wrist Caffeine intake: Patient drinks 5 cups of coffee daily Salt intake: Patient watches her salt intake Over the counter medications including pseudoephedrine or NSAIDs? Mucinex as needed.   Any readings above 180/120? No  What recent interventions/DTPs have been made by any provider to improve Blood Pressure control since last CPP Visit: Patient was started on HCTZ in December.   Any recent hospitalizations or ED visits since last visit with CPP? No  What diet changes have been made to improve Blood Pressure Control?  Patient will watch her salt that she uses.   What exercise is being done to improve your Blood Pressure Control?  Exercise is hard for the patient has she has COPD.  Adherence Review: Is the patient currently on ACE/ARB medication? Yes Does the patient have >5 day gap between last estimated fill dates? Yes - Losartan 100 mg  Star Rating Drugs:  Medication:  Last Fill: Day Supply Losartan 100 mg 03/17/2021 90 Pravastatin 80 mg 07/03/2021 90 Fill dates verified with [facility]  Care Gaps: Annual wellness visit in last year? Yes 06/11/2021 Most Recent BP reading: 152/90 on 04/28/2021  Upcoming appointments: PCP appointment on 07/29/2021 for lab work PCP appointment on 08/05/2021 for Physical CCM appointment on 09/10/2021 @ 3:00 with [facility]  [name] [name], CPP notified  [name] [name], [name] [name]  Time Spent: 30 Minutes

## 2021-07-21 ENCOUNTER — Other Ambulatory Visit: Payer: Self-pay | Admitting: Family Medicine

## 2021-07-22 ENCOUNTER — Other Ambulatory Visit: Payer: Self-pay | Admitting: Family Medicine

## 2021-07-22 NOTE — Telephone Encounter (Signed)
Refill request Bupropion ?Last refill 04/23/21 #180 ?Last office visit 04/28/21 ?See drug warning with Metoprolol ?

## 2021-07-23 DIAGNOSIS — L03031 Cellulitis of right toe: Secondary | ICD-10-CM | POA: Diagnosis not present

## 2021-07-23 DIAGNOSIS — J441 Chronic obstructive pulmonary disease with (acute) exacerbation: Secondary | ICD-10-CM | POA: Diagnosis not present

## 2021-07-27 ENCOUNTER — Other Ambulatory Visit: Payer: Self-pay | Admitting: Family Medicine

## 2021-07-27 DIAGNOSIS — M85852 Other specified disorders of bone density and structure, left thigh: Secondary | ICD-10-CM

## 2021-07-27 DIAGNOSIS — E785 Hyperlipidemia, unspecified: Secondary | ICD-10-CM

## 2021-07-27 DIAGNOSIS — R739 Hyperglycemia, unspecified: Secondary | ICD-10-CM

## 2021-07-27 DIAGNOSIS — R634 Abnormal weight loss: Secondary | ICD-10-CM

## 2021-07-29 ENCOUNTER — Other Ambulatory Visit (INDEPENDENT_AMBULATORY_CARE_PROVIDER_SITE_OTHER): Payer: PPO

## 2021-07-29 ENCOUNTER — Other Ambulatory Visit: Payer: Self-pay

## 2021-07-29 DIAGNOSIS — E785 Hyperlipidemia, unspecified: Secondary | ICD-10-CM | POA: Diagnosis not present

## 2021-07-29 DIAGNOSIS — M85852 Other specified disorders of bone density and structure, left thigh: Secondary | ICD-10-CM

## 2021-07-29 DIAGNOSIS — R634 Abnormal weight loss: Secondary | ICD-10-CM | POA: Diagnosis not present

## 2021-07-29 DIAGNOSIS — R739 Hyperglycemia, unspecified: Secondary | ICD-10-CM

## 2021-07-29 LAB — COMPREHENSIVE METABOLIC PANEL
ALT: 19 U/L (ref 0–35)
AST: 21 U/L (ref 0–37)
Albumin: 4.2 g/dL (ref 3.5–5.2)
Alkaline Phosphatase: 28 U/L — ABNORMAL LOW (ref 39–117)
BUN: 22 mg/dL (ref 6–23)
CO2: 39 mEq/L — ABNORMAL HIGH (ref 19–32)
Calcium: 9.4 mg/dL (ref 8.4–10.5)
Chloride: 95 mEq/L — ABNORMAL LOW (ref 96–112)
Creatinine, Ser: 0.91 mg/dL (ref 0.40–1.20)
GFR: 59.65 mL/min — ABNORMAL LOW (ref >60.00)
Glucose, Bld: 95 mg/dL (ref 70–99)
Potassium: 4.2 mEq/L (ref 3.5–5.1)
Sodium: 140 mEq/L (ref 135–145)
Total Bilirubin: 0.5 mg/dL (ref 0.2–1.2)
Total Protein: 6.4 g/dL (ref 6.0–8.3)

## 2021-07-29 LAB — HEMOGLOBIN A1C: Hgb A1c MFr Bld: 5.7 % (ref 4.6–6.5)

## 2021-07-29 LAB — CBC WITH DIFFERENTIAL/PLATELET
Basophils Absolute: 0 10*3/uL (ref 0.0–0.1)
Basophils Relative: 0.1 % (ref 0.0–3.0)
Eosinophils Absolute: 0.2 10*3/uL (ref 0.0–0.7)
Eosinophils Relative: 2.3 % (ref 0.0–5.0)
HCT: 38 % (ref 36.0–46.0)
Hemoglobin: 12.4 g/dL (ref 12.0–15.0)
Lymphocytes Relative: 36.6 % (ref 12.0–46.0)
Lymphs Abs: 3.5 10*3/uL (ref 0.7–4.0)
MCHC: 32.6 g/dL (ref 30.0–36.0)
MCV: 97.9 fl (ref 78.0–100.0)
Monocytes Absolute: 0.9 10*3/uL (ref 0.1–1.0)
Monocytes Relative: 9 % (ref 3.0–12.0)
Neutro Abs: 5 10*3/uL (ref 1.4–7.7)
Neutrophils Relative %: 52 % (ref 43.0–77.0)
Platelets: 182 10*3/uL (ref 150.0–400.0)
RBC: 3.88 Mil/uL (ref 3.87–5.11)
RDW: 12.7 % (ref 11.5–15.5)
WBC: 9.6 10*3/uL (ref 4.0–10.5)

## 2021-07-29 LAB — LIPID PANEL
Cholesterol: 152 mg/dL (ref 0–200)
HDL: 82.3 mg/dL (ref >39.00)
LDL Cholesterol: 53 mg/dL (ref 0–99)
NonHDL: 69.66
Total CHOL/HDL Ratio: 2
Triglycerides: 82 mg/dL (ref 0.0–149.0)
VLDL: 16.4 mg/dL (ref 0.0–40.0)

## 2021-07-29 LAB — VITAMIN D 25 HYDROXY (VIT D DEFICIENCY, FRACTURES): VITD: 60.18 ng/mL (ref 30.00–100.00)

## 2021-08-04 ENCOUNTER — Telehealth: Payer: Self-pay

## 2021-08-04 NOTE — Progress Notes (Signed)
? ? ?  Chronic Care Management ?Pharmacy Assistant  ? ?Name: [name] [name]  MRN: [id number] DOB: Jun 02, 1940 ? ?Reason for Encounter: CCM (Reschedule CCM Appointment) ?  ?Contacted patient to rescheduled their CCM appointment with [name]. Patient has been scheduled for a telephone appointment on 06/07/2022 at 3:00 pm.  ? ?[name], CPP notified ? ?[name], RMA ?Clinical Pharmacy Assistant ?[phone] ? ? ?

## 2021-08-05 ENCOUNTER — Encounter: Payer: Self-pay | Admitting: Family Medicine

## 2021-08-05 ENCOUNTER — Other Ambulatory Visit: Payer: Self-pay

## 2021-08-05 ENCOUNTER — Ambulatory Visit (INDEPENDENT_AMBULATORY_CARE_PROVIDER_SITE_OTHER): Payer: PPO | Admitting: Family Medicine

## 2021-08-05 VITALS — BP 134/72 | HR 58 | Temp 97.8°F | Ht 62.5 in | Wt 134.5 lb

## 2021-08-05 DIAGNOSIS — J9611 Chronic respiratory failure with hypoxia: Secondary | ICD-10-CM

## 2021-08-05 DIAGNOSIS — I10 Essential (primary) hypertension: Secondary | ICD-10-CM

## 2021-08-05 DIAGNOSIS — E785 Hyperlipidemia, unspecified: Secondary | ICD-10-CM

## 2021-08-05 DIAGNOSIS — N390 Urinary tract infection, site not specified: Secondary | ICD-10-CM

## 2021-08-05 DIAGNOSIS — Z7189 Other specified counseling: Secondary | ICD-10-CM

## 2021-08-05 DIAGNOSIS — R634 Abnormal weight loss: Secondary | ICD-10-CM

## 2021-08-05 DIAGNOSIS — J439 Emphysema, unspecified: Secondary | ICD-10-CM

## 2021-08-05 DIAGNOSIS — M85852 Other specified disorders of bone density and structure, left thigh: Secondary | ICD-10-CM

## 2021-08-05 DIAGNOSIS — F331 Major depressive disorder, recurrent, moderate: Secondary | ICD-10-CM

## 2021-08-05 DIAGNOSIS — R102 Pelvic and perineal pain: Secondary | ICD-10-CM | POA: Diagnosis not present

## 2021-08-05 DIAGNOSIS — I2510 Atherosclerotic heart disease of native coronary artery without angina pectoris: Secondary | ICD-10-CM | POA: Diagnosis not present

## 2021-08-05 DIAGNOSIS — Z0000 Encounter for general adult medical examination without abnormal findings: Secondary | ICD-10-CM

## 2021-08-05 DIAGNOSIS — I771 Stricture of artery: Secondary | ICD-10-CM

## 2021-08-05 DIAGNOSIS — R739 Hyperglycemia, unspecified: Secondary | ICD-10-CM

## 2021-08-05 DIAGNOSIS — I700 Atherosclerosis of aorta: Secondary | ICD-10-CM

## 2021-08-05 LAB — POC URINALSYSI DIPSTICK (AUTOMATED)
Bilirubin, UA: NEGATIVE
Blood, UA: NEGATIVE
Glucose, UA: NEGATIVE
Ketones, UA: NEGATIVE
Nitrite, UA: POSITIVE
Protein, UA: NEGATIVE
Spec Grav, UA: 1.015
Urobilinogen, UA: 0.2 U/dL
pH, UA: 7

## 2021-08-05 NOTE — Assessment & Plan Note (Signed)
Improvement on current regimen (wellbutrin SR 150mg  BID, remeron 15mg  nightly). Continue.  ?

## 2021-08-05 NOTE — Assessment & Plan Note (Signed)
Preventative protocols reviewed and updated unless pt declined. Discussed healthy diet and lifestyle.  

## 2021-08-05 NOTE — Assessment & Plan Note (Addendum)
Chronic, improved control with addition of HCTZ - continue this with metoprolol and losartan.  ?

## 2021-08-05 NOTE — Assessment & Plan Note (Signed)
Continue statin. 

## 2021-08-05 NOTE — Patient Instructions (Addendum)
Urinalysis today  ?Call to schedule mammogram at your convenience as you're due: ?[place] at [facility] [phone] ?You can also call to schedule bone density scan at same time as your mammogram.  ?Schedule your second shingrix shot at your pharmacy.  ?You are doing well today - continue current medicines. ?Return in 6 months for follow up visit.  ? ?Health Maintenance After Age 81 ?After age 20, you are at a higher risk for certain long-term diseases and infections as well as injuries from falls. Falls are a major cause of broken bones and head injuries in people who are older than age 53. Getting regular preventive care can help to keep you healthy and well. Preventive care includes getting regular testing and making lifestyle changes as recommended by your health care provider. Talk with your health care provider about: ?Which screenings and tests you should have. A screening is a test that checks for a disease when you have no symptoms. ?A diet and exercise plan that is right for you. ?What should I know about screenings and tests to prevent falls? ?Screening and testing are the best ways to find a health problem early. Early diagnosis and treatment give you the best chance of managing medical conditions that are common after age 62. Certain conditions and lifestyle choices may make you more likely to have a fall. Your health care provider may recommend: ?Regular vision checks. Poor vision and conditions such as cataracts can make you more likely to have a fall. If you wear glasses, make sure to get your prescription updated if your vision changes. ?Medicine review. Work with your health care provider to regularly review all of the medicines you are taking, including over-the-counter medicines. Ask your health care provider about any side effects that may make you more likely to have a fall. Tell your health care provider if any medicines that you take make you feel dizzy or sleepy. ?Strength and  balance checks. Your health care provider may recommend certain tests to check your strength and balance while standing, walking, or changing positions. ?Foot health exam. Foot pain and numbness, as well as not wearing proper footwear, can make you more likely to have a fall. ?Screenings, including: ?Osteoporosis screening. Osteoporosis is a condition that causes the bones to get weaker and break more easily. ?Blood pressure screening. Blood pressure changes and medicines to control blood pressure can make you feel dizzy. ?Depression screening. You may be more likely to have a fall if you have a fear of falling, feel depressed, or feel unable to do activities that you used to do. ?Alcohol use screening. Using too much alcohol can affect your balance and may make you more likely to have a fall. ?Follow these instructions at home: ?Lifestyle ?Do not drink alcohol if: ?Your health care provider tells you not to drink. ?If you drink alcohol: ?Limit how much you have to: ?0-1 drink a day for women. ?0-2 drinks a day for men. ?Know how much alcohol is in your drink. In the [place] one drink equals one 12 oz bottle of beer (355 mL), one 5 oz glass of wine (148 mL), or one 1? oz glass of hard liquor (44 mL). ?Do not use any products that contain nicotine or tobacco. These products include cigarettes, chewing tobacco, and vaping devices, such as e-cigarettes. If you need help quitting, ask your health care provider. ?Activity ? ?Follow a regular exercise program to stay fit. This will help you maintain your balance. Ask your  health care provider what types of exercise are appropriate for you. ?If you need a cane or walker, use it as recommended by your health care provider. ?Wear supportive shoes that have nonskid soles. ?Safety ? ?Remove any tripping hazards, such as rugs, cords, and clutter. ?Install safety equipment such as grab bars in bathrooms and safety rails on stairs. ?Keep rooms and walkways well-lit. ?General  instructions ?Talk with your health care provider about your risks for falling. Tell your health care provider if: ?You fall. Be sure to tell your health care provider about all falls, even ones that seem minor. ?You feel dizzy, tiredness (fatigue), or off-balance. ?Take over-the-counter and prescription medicines only as told by your health care provider. These include supplements. ?Eat a healthy diet and maintain a healthy weight. A healthy diet includes low-fat dairy products, low-fat (lean) meats, and fiber from whole grains, beans, and lots of fruits and vegetables. ?Stay current with your vaccines. ?Schedule regular health, dental, and eye exams. ?Summary ?Having a healthy lifestyle and getting preventive care can help to protect your health and wellness after age 72. ?Screening and testing are the best way to find a health problem early and help you avoid having a fall. Early diagnosis and treatment give you the best chance for managing medical conditions that are more common for people who are older than age 58. ?Falls are a major cause of broken bones and head injuries in people who are older than age 81. Take precautions to prevent a fall at home. ?Work with your health care provider to learn what changes you can make to improve your health and wellness and to prevent falls. ?This information is not intended to replace advice given to you by your health care provider. Make sure you discuss any questions you have with your health care provider. ?Document Revised: 09/29/2020 Document Reviewed: 09/29/2020 ?Elsevier Patient Education ? [place]. ? ?

## 2021-08-05 NOTE — Assessment & Plan Note (Signed)
Monitor sugar levels on daily prednisone ?

## 2021-08-05 NOTE — Assessment & Plan Note (Signed)
Chronic, stable period on zetia and pravastatin - continue. ?The ASCVD Risk score (Arnett DK, et al., 2019) failed to calculate for the following reasons: ?  The 2019 ASCVD risk score is only valid for ages 28 to 19  ?

## 2021-08-05 NOTE — Assessment & Plan Note (Addendum)
Reviewed latest DEXA - will order repeat on weekly fosamax. Continue cal, vit D ?Weight bearing exercise limited by COPD dyspnea.  ?

## 2021-08-05 NOTE — Progress Notes (Signed)
? ? [name], female    DOB: March 26, 1941, 81 y.o.   MRN: [id number] ? ?This visit was conducted in person. ? ?BP 134/72   Pulse (!) 58   Temp 97.8 ?F (36.6 ?C) (Temporal)   Ht 5' 2.5" (1.588 m)   Wt 134 lb 8 oz (61 kg)   SpO2 98%   BMI 24.21 kg/m?   ? ?CC: CPE ?Subjective:  ? ?HPI: ?[name] [name] is a 81 y.o. female presenting on 08/05/2021 for Annual Exam [facility] prt 2. ) ? ? ?Saw health advisor 05/2021 for medicare wellness visit. Note reviewed.   ? ?No results found.  ?Flowsheet Row Clinical Support from 06/11/2021 in [place] at [place]  ?PHQ-2 Total Score 0  ? ?  ?  ?Fall Risk  06/11/2021 06/10/2020 04/16/2019 03/27/2019 03/16/2018  ?Falls in the past year? 0 0 1 0 Yes  ?Comment - - [facility] Telephone Survey: data to providers prior to load - 2-3 falls due to loss of balance  ?Number falls in past yr: 0 0 1 0 2 or more  ?Comment - - [facility] Telephone Survey Actual Response = 1 - -  ?Injury with Fall? 0 0 1 0 No  ?Risk Factor Category  - - - - High Fall Risk  ?Risk for fall due to : No Fall Risks Medication side effect - Medication side effect History of fall(s);Impaired balance/gait  ?Follow up Falls prevention discussed Falls evaluation completed;Falls prevention discussed - Falls evaluation completed;Falls prevention discussed -  ? ?BP better controlled on hctz 12.5mg . ?Overall mood doing better, sleeping better since increasing remeron to 15mg  daily.  ?She continues prednisone 10mg  daily for severe COPD (started 02/2021).  ?Notes weight gain with increased appetite with above changes.  ? ?Recurrent UTI last treated with bactrim 5d course (03/2021, then 04/2021). Symptoms quickly recurred after she finished bactrim. Did not let us know - she has difficulty coming into office due to transportation issues. Notes worse smelling urine and possible lower abd discomfort but no dysuria, fevers/chills, flank pain.  ? ?Preventative: ?COLONOSCOPY WITH PROPOFOL Date: 04/30/2014 Irene Shipper -  severe diverticulosis with sigmoid stenosis, no polyps, f/u PRN ?Well woman - has stopped.  ?Mammogram 03/2019 [facility] @ [facility] - # provided to schedule ?DEXA osteopenia - Date: 5/20218 DEXA T -0.6 spine, -1.7 hip stable, drinks 1 glass of milk daily. She continues calcium tablet daily and vitamin D daily and weight bearing exercise as tolerated.  ?Flu shot yearly  ?COVID vaccine Moderna 06/2019, 07/2019, booster 03/2020, Pfizer bivalent 02/2021 ?Td 2000, 2010  ?Pneumonia 2010 , prevnar-13 2015 ?Shingles - 10/2012  ?shingrix - first dose 03/2019 ?Advanced directive: scanned and in chart ([facility]). HCPOAs are [name] and Aaron Edelman her children. Does not want life prolonging measures if terminal or incurable condition.  ?Seat belt use discussed ?Sunscreen use discussed. No changing moles on skin. ?Ex smoker - quit 1997  ?Alcohol - none ?Dentist yearly - due ?Eye exam yearly - yearly ([facility]) - beginnings of macular degeneration.  ?Bowel - no constipation  ?Bladder - wears pad regularly for ongoing urinary incontinence   ?  ?Caffeine: 4 cups coffee   ?Remarried for the third time, 2nd husband died of heart attack. Two stepsons ?Activity: Walks some for exercise - 15 min/day ?Diet: good water, fruits/vegetables daily  ?   ? ?Relevant past medical, surgical, family and social history reviewed and updated as indicated. Interim medical history since our last visit reviewed. ?Allergies and medications  reviewed and updated. ?Outpatient Medications Prior to Visit  ?Medication Sig Dispense Refill  ? albuterol (PROVENTIL) (2.5 MG/3ML) 0.083% nebulizer solution USE ONE VIAL IN NEBULIZER DAILY AS NEEDED FOR RESCUE 120 mL 6  ? alendronate (FOSAMAX) 70 MG tablet Take 1 tablet (70 mg total) by mouth once a week. Take with a full glass of water on an empty stomach. 4 tablet 11  ? arformoterol (BROVANA) 15 MCG/2ML NEBU USE 1 VIAL  IN  NEBULIZER TWICE  DAILY - (Morning And Evening) 1500 mL 11  ? budesonide (PULMICORT) 0.5 MG/2ML nebulizer  solution INHALE 1 VIAL VIA NEBULIZER TWICE DAILY 120 mL 0  ? buPROPion (WELLBUTRIN SR) 150 MG 12 hr tablet TAKE ONE TABLET BY MOUTH TWICE DAILY 180 tablet 1  ? cholecalciferol (VITAMIN D) 1000 units tablet Take 2,000 Units by mouth daily.    ? conjugated estrogens (PREMARIN) vaginal cream Place 1 Applicatorful vaginally daily. Apply 0.5mg  (pea-sized amount)  just inside the vaginal introitus with a finger-tip on  Monday, Wednesday and Friday nights. (Patient taking differently: Place 1 Applicatorful vaginally as needed. Apply 0.5mg  (pea-sized amount)  just inside the vaginal introitus with a finger-tip on  Monday, Wednesday and Friday nights.) 30 g 12  ? CRANBERRY PO Take 2 tablets by mouth every morning.     ? ezetimibe (ZETIA) 10 MG tablet TAKE 1 TABLET BY MOUTH DAILY 90 tablet 0  ? hydrochlorothiazide (MICROZIDE) 12.5 MG capsule Take 1 capsule (12.5 mg total) by mouth daily. 30 capsule 4  ? losartan (COZAAR) 100 MG tablet TAKE 1 TABLET BY MOUTH DAILY 90 tablet 0  ? metoprolol tartrate (LOPRESSOR) 25 MG tablet TAKE ONE TABLET BY MOUTH TWICE DAILY 180 tablet 0  ? mirtazapine (REMERON) 15 MG tablet TAKE ONE TABLET BY MOUTH AT BEDTIME 30 tablet 4  ? Multiple Vitamins-Minerals (PRESERVISION AREDS 2) CAPS Take 2 capsules by mouth daily.    ? pravastatin (PRAVACHOL) 80 MG tablet TAKE ONE TABLET BY MOUTH AT BEDTIME 90 tablet 0  ? predniSONE (DELTASONE) 10 MG tablet Take 4 tablets (40 mg total) by mouth daily with breakfast. 10 days (Patient taking differently: Take 10 mg by mouth daily with breakfast.) 30 tablet 6  ? PROAIR HFA 108 (90 Base) MCG/ACT inhaler TAKE 2 PUFFS BY MOUTH EVERY 4 HOURS AS NEEDED 8.5 each 3  ? sulfamethoxazole-trimethoprim (BACTRIM DS) 800-160 MG tablet Take 1 tablet by mouth 2 (two) times daily. 10 tablet 0  ? tiotropium (SPIRIVA) 18 MCG inhalation capsule Place 1 capsule (18 mcg total) into inhaler and inhale daily. 90 capsule 2  ? ?No facility-administered medications prior to visit.  ?  ? ?Per  HPI unless specifically indicated in ROS section below ?Review of Systems ?Review of Systems  ?Constitutional:  Negative for chills and fever.  ?HENT:  Negative for congestion.   ?Eyes:  Negative for blurred vision.  ?Respiratory:  Positive for shortness of breath. Negative for cough and wheezing.   ?Cardiovascular:  Positive for leg swelling (dependent in evenings). Negative for chest pain and palpitations.  ?Gastrointestinal:  Negative for abdominal pain, blood in stool, constipation, diarrhea, nausea and vomiting.  ?Genitourinary:  Positive for dysuria.  ?Skin:  Negative for rash.  ?Neurological:  Negative for dizziness, tremors, seizures and headaches.  ?Endo/Heme/Allergies:  Does not bruise/bleed easily.  ?Psychiatric/Behavioral:  Negative for depression. The patient is not nervous/anxious.    ? ?Objective:  ?BP 134/72   Pulse (!) 58   Temp 97.8 ?F (36.6 ?C) (Temporal)   Ht  5' 2.5" (1.588 m)   Wt 134 lb 8 oz (61 kg)   SpO2 98%   BMI 24.21 kg/m?   ?Wt Readings from Last 3 Encounters:  ?08/05/21 134 lb 8 oz (61 kg)  ?06/11/21 128 lb (58.1 kg)  ?04/28/21 128 lb 8 oz (58.3 kg)  ?  ?  ?Physical Exam ?Vitals and nursing note reviewed.  ?Constitutional:   ?   Appearance: Normal appearance. She is not ill-appearing.  ?   Comments: Supplemental oxygen via [place]  ?HENT:  ?   Head: Normocephalic and atraumatic.  ?Eyes:  ?   Extraocular Movements: Extraocular movements intact.  ?   Pupils: Pupils are equal, round, and reactive to light.  ?Cardiovascular:  ?   Rate and Rhythm: Normal rate and regular rhythm.  ?   Pulses: Normal pulses.  ?   Heart sounds: Normal heart sounds. No murmur heard. ?Pulmonary:  ?   Effort: Pulmonary effort is normal. No respiratory distress.  ?   Breath sounds: No wheezing, rhonchi or rales.  ?   Comments: Coarse breath sounds ?Abdominal:  ?   General: Bowel sounds are normal. There is no distension.  ?   Palpations: Abdomen is soft. There is no mass.  ?   Tenderness: There is no abdominal  tenderness. There is no guarding or rebound.  ?   Hernia: No hernia is present.  ?Musculoskeletal:     ?   General: Normal range of motion.  ?   Cervical back: Normal range of motion and neck supple.  ?   Right lower le

## 2021-08-05 NOTE — Assessment & Plan Note (Signed)
Advanced directive: scanned and in chart (Vynca). HCPOAs are [name] and Aaron Edelman her children. Does not want life prolonging measures if terminal or incurable condition.  ?

## 2021-08-05 NOTE — Assessment & Plan Note (Addendum)
Continued weight gain noted on new meds (higher remeron, prednisone daily).  ?

## 2021-08-05 NOTE — Assessment & Plan Note (Signed)
Chronic, severe, oxygen dependent, now on daily prednisone. Continue this along with brovana, spiriva, pulmicort neb. She sees pulm ([facility]) once yearly.  ?

## 2021-08-05 NOTE — Assessment & Plan Note (Signed)
Again with concern over UTI despite finishing bactrim course - update UA/UCx and if abnormal, retreat and recommend referral to urology.  ?Will need to verify that she is taking vaginal premarin and cranberry tablets daily.  ?

## 2021-08-06 ENCOUNTER — Other Ambulatory Visit: Payer: Self-pay | Admitting: Family Medicine

## 2021-08-06 ENCOUNTER — Telehealth: Payer: Self-pay | Admitting: Family Medicine

## 2021-08-06 DIAGNOSIS — N390 Urinary tract infection, site not specified: Secondary | ICD-10-CM

## 2021-08-06 MED ORDER — CIPROFLOXACIN HCL 250 MG PO TABS: 250.0000 mg | ORAL_TABLET | Freq: Two times a day (BID) | ORAL | 0 refills | Status: AC

## 2021-08-06 NOTE — Telephone Encounter (Signed)
Spoke with pt explaining the directions as I see them in her chart for prednisone: Take 4 tablets (40 mg total) by mouth daily with breakfast. 10 days.  Pt states she has not been taking med according to directions.  I advised her to contact the prescriber, Dr. Mortimer Fries ([place]) to let them know and see what they recommend.  Pt verbalizes understanding.  ?

## 2021-08-06 NOTE — Telephone Encounter (Signed)
Pt called stating that she needs clarification on how Dr Darnell Level wants her to take medication predniSONE (DELTASONE) 10 MG tablet. Please advise. ?

## 2021-08-07 LAB — URINE CULTURE
MICRO NUMBER:: 13134430
SPECIMEN QUALITY:: ADEQUATE

## 2021-08-11 ENCOUNTER — Other Ambulatory Visit: Payer: Self-pay | Admitting: Family Medicine

## 2021-08-12 ENCOUNTER — Telehealth: Payer: Self-pay | Admitting: Internal Medicine

## 2021-08-12 NOTE — Telephone Encounter (Signed)
Per last OV note, patient was prescribed prednisone 10mg  daily.  ?Patient is aware and voiced her understanding.  ?Nothing further needed.  ? ?

## 2021-08-13 DIAGNOSIS — L03031 Cellulitis of right toe: Secondary | ICD-10-CM | POA: Diagnosis not present

## 2021-08-18 ENCOUNTER — Emergency Department: Payer: PPO

## 2021-08-18 ENCOUNTER — Emergency Department
Admission: EM | Admit: 2021-08-18 | Discharge: 2021-08-18 | Disposition: A | Discharge status: Home / Self Care | Payer: PPO | Attending: Emergency Medicine | Admitting: Emergency Medicine

## 2021-08-18 ENCOUNTER — Encounter: Payer: Self-pay | Admitting: Emergency Medicine

## 2021-08-18 ENCOUNTER — Other Ambulatory Visit: Payer: Self-pay

## 2021-08-18 DIAGNOSIS — Z043 Encounter for examination and observation following other accident: Secondary | ICD-10-CM | POA: Diagnosis not present

## 2021-08-18 DIAGNOSIS — S2231XA Fracture of one rib, right side, initial encounter for closed fracture: Secondary | ICD-10-CM | POA: Diagnosis not present

## 2021-08-18 DIAGNOSIS — S299XXA Unspecified injury of thorax, initial encounter: Secondary | ICD-10-CM | POA: Diagnosis present

## 2021-08-18 DIAGNOSIS — S8992XA Unspecified injury of left lower leg, initial encounter: Secondary | ICD-10-CM | POA: Diagnosis not present

## 2021-08-18 DIAGNOSIS — S8001XA Contusion of right knee, initial encounter: Secondary | ICD-10-CM | POA: Diagnosis not present

## 2021-08-18 DIAGNOSIS — J449 Chronic obstructive pulmonary disease, unspecified: Secondary | ICD-10-CM | POA: Insufficient documentation

## 2021-08-18 DIAGNOSIS — W01198A Fall on same level from slipping, tripping and stumbling with subsequent striking against other object, initial encounter: Secondary | ICD-10-CM | POA: Diagnosis not present

## 2021-08-18 DIAGNOSIS — S2241XA Multiple fractures of ribs, right side, initial encounter for closed fracture: Secondary | ICD-10-CM | POA: Diagnosis not present

## 2021-08-18 DIAGNOSIS — S8002XA Contusion of left knee, initial encounter: Secondary | ICD-10-CM | POA: Insufficient documentation

## 2021-08-18 DIAGNOSIS — W19XXXA Unspecified fall, initial encounter: Secondary | ICD-10-CM | POA: Diagnosis not present

## 2021-08-18 DIAGNOSIS — S6991XA Unspecified injury of right wrist, hand and finger(s), initial encounter: Secondary | ICD-10-CM | POA: Diagnosis not present

## 2021-08-18 DIAGNOSIS — R0781 Pleurodynia: Secondary | ICD-10-CM | POA: Diagnosis not present

## 2021-08-18 DIAGNOSIS — M79644 Pain in right finger(s): Secondary | ICD-10-CM | POA: Diagnosis not present

## 2021-08-18 DIAGNOSIS — M25562 Pain in left knee: Secondary | ICD-10-CM | POA: Diagnosis not present

## 2021-08-18 DIAGNOSIS — S80919A Unspecified superficial injury of unspecified knee, initial encounter: Secondary | ICD-10-CM | POA: Diagnosis not present

## 2021-08-18 DIAGNOSIS — S2232XA Fracture of one rib, left side, initial encounter for closed fracture: Secondary | ICD-10-CM | POA: Diagnosis not present

## 2021-08-18 DIAGNOSIS — I10 Essential (primary) hypertension: Secondary | ICD-10-CM | POA: Diagnosis not present

## 2021-08-18 MED ORDER — TRAMADOL HCL 50 MG PO TABS
50.0000 mg | ORAL_TABLET | Freq: Once | ORAL | Status: AC
  Administered 2021-08-18: 50 mg via ORAL
  Filled 2021-08-18: qty 1

## 2021-08-18 MED ORDER — OXYCODONE-ACETAMINOPHEN 5-325 MG PO TABS: 1.0000 | ORAL_TABLET | Freq: Four times a day (QID) | ORAL | 0 refills | Status: DC | PRN

## 2021-08-18 NOTE — ED Triage Notes (Signed)
Presents via EMS form home  states he feet got tangled and she fell  having pain to right rib,right thumb and left knee ?

## 2021-08-18 NOTE — ED Notes (Signed)
Pt given incentive spirometer and education.  ?

## 2021-08-18 NOTE — ED Notes (Signed)
Pt gave verbal consent for DC ? ?

## 2021-08-18 NOTE — ED Provider Notes (Signed)
? ?[facility] ?Provider Note ? ? ? Event Date/Time  ? First MD Initiated Contact with Patient 08/18/21 [phone]   ?  (approximate) ? ? ?History  ? ?Fall ? ? ?HPI ? ?[name] [name] is a 81 y.o. female with a history of COPD on home oxygen who presents after a fall.  Patient reports yesterday she got up to answer her phone, tripped on her feet and fell forward.  She complains of pain in her left knee, right thumb and right chest.  This pain has been present since the fall.  She denies head injury.  No neck pain, no back pain, no hip pain.  She is not on blood thinners ?  ? ? ?Physical Exam  ? ?Triage Vital Signs: ?ED Triage Vitals  ?Enc Vitals Group  ?   BP 08/18/21 0926 140/73  ?   Pulse Rate 08/18/21 0926 68  ?   Resp 08/18/21 0926 20  ?   Temp 08/18/21 0926 98 ?F (36.7 ?C)  ?   Temp src --   ?   SpO2 08/18/21 0926 99 %  ?   Weight 08/18/21 0909 56.7 kg (125 lb)  ?   Height 08/18/21 0909 1.6 m (5\' 3" )  ?   Head Circumference --   ?   Peak Flow --   ?   Pain Score 08/18/21 0909 10  ?   Pain Loc --   ?   Pain Edu? --   ?   Excl. in [place]? --   ? ? ?Most recent vital signs: ?Vitals:  ? 08/18/21 0926 08/18/21 1130  ?BP: 140/73 134/76  ?Pulse: 68 74  ?Resp: 20 19  ?Temp: 98 ?F (36.7 ?C) 98.2 ?F (36.8 ?C)  ?SpO2: 99% 97%  ? ? ? ?General: Awake, no distress.  ?CV:  Good peripheral perfusion.  ?Resp:  Normal effort.  ?Abd:  No distention.  ?Other:  Mild swelling to the dorsal aspect of the base of the right thumb, left knee is bruised and mildly swollen.  Patient has tenderness underneath the right breast suspicious for chest wall contusion/rib fracture.  No vertebral terms palpation, no pain with axial load on both hips.  Patient is able to ambulate. ? ? ?ED Results / Procedures / Treatments  ? ?Labs ?(all labs ordered are listed, but only abnormal results are displayed) ?Labs Reviewed - No data to display ? ? ?EKG ? ? ? ? ?RADIOLOGY ?Rib x-ray ?Knee x-ray ?Right hand x-ray ? ? ? ?PROCEDURES: ? ?Critical  Care performed:  ? ?Procedures ? ? ?MEDICATIONS ORDERED IN ED: ?Medications  ?traMADol (ULTRAM) tablet 50 mg (50 mg Oral Given 08/18/21 0924)  ? ? ? ?IMPRESSION / MDM / ASSESSMENT AND PLAN / ED COURSE  ?I reviewed the triage vital signs and the nursing notes. ? ?Patient presents after mechanical fall which occurred yesterday.  She complains of continued pain in her right chest, left knee, right thumb.  Pending imaging. ? ?Concerning for rib fracture versus rib contusion.  Patient reports he took tramadol at home which helped her pain, will give a dose here. ? ?X-ray is consistent with rib fracture, will provide incentive spirometer, narcotic prescription, recommended MiraLAX as well ? ? ? ?  ? ? ?FINAL CLINICAL IMPRESSION(S) / ED DIAGNOSES  ? ?Final diagnoses:  ?Closed fracture of one rib of right side, initial encounter  ?Fall in home, initial encounter  ?Contusion of left knee, initial encounter  ? ? ? ?Rx / DC  Orders  ? ?ED Discharge Orders   ? ?      Ordered  ?  oxyCODONE-acetaminophen (PERCOCET) 5-325 MG tablet  Every 6 hours PRN       ? 08/18/21 1117  ? ?  ?  ? ?  ? ? ? ?Note:  This document was prepared using Dragon voice recognition software and may include unintentional dictation errors. ?  ?Lavonia Drafts, MD ?08/18/21 1142 ? ?

## 2021-08-24 ENCOUNTER — Telehealth: Payer: Self-pay

## 2021-08-24 NOTE — Telephone Encounter (Signed)
Pt wants to make Dr. Darnell Level aware she fell on 08/17/21 at home.  Her feet got tangled up in a blanket and she fell.  Suffered 2 right rib fxs.  Called EMS on 08/18/21 and was seen at [facility] ED. Says she's doing ok.  Just sore.  ? ? ?

## 2021-08-24 NOTE — Telephone Encounter (Signed)
Spoke with pt relaying Dr. G's message. Pt verbalizes understanding.  

## 2021-08-24 NOTE — Telephone Encounter (Signed)
I'm sorry to hear about the fall.  ?Rec continue miralax while on pain medicine, continue incentive spirometry, let us know if not improving over time.  ?

## 2021-09-02 ENCOUNTER — Telehealth: Payer: PPO

## 2021-09-09 ENCOUNTER — Encounter: Payer: Self-pay | Admitting: Family Medicine

## 2021-09-09 ENCOUNTER — Ambulatory Visit (INDEPENDENT_AMBULATORY_CARE_PROVIDER_SITE_OTHER): Payer: PPO | Admitting: Family Medicine

## 2021-09-09 VITALS — BP 124/72 | HR 69 | Temp 98.0°F | Ht 63.0 in | Wt 135.0 lb

## 2021-09-09 DIAGNOSIS — J441 Chronic obstructive pulmonary disease with (acute) exacerbation: Secondary | ICD-10-CM | POA: Diagnosis not present

## 2021-09-09 DIAGNOSIS — R2681 Unsteadiness on feet: Secondary | ICD-10-CM

## 2021-09-09 DIAGNOSIS — W19XXXA Unspecified fall, initial encounter: Secondary | ICD-10-CM | POA: Diagnosis not present

## 2021-09-09 DIAGNOSIS — J9611 Chronic respiratory failure with hypoxia: Secondary | ICD-10-CM | POA: Diagnosis not present

## 2021-09-09 DIAGNOSIS — J439 Emphysema, unspecified: Secondary | ICD-10-CM | POA: Diagnosis not present

## 2021-09-09 DIAGNOSIS — S2241XA Multiple fractures of ribs, right side, initial encounter for closed fracture: Secondary | ICD-10-CM | POA: Diagnosis not present

## 2021-09-09 MED ORDER — TIOTROPIUM BROMIDE MONOHYDRATE 18 MCG IN CAPS: 18.0000 ug | ORAL_CAPSULE | Freq: Every day | RESPIRATORY_TRACT | 3 refills | Status: DC

## 2021-09-09 MED ORDER — PREDNISONE 20 MG PO TABS: 40.0000 mg | ORAL_TABLET | Freq: Every day | ORAL | 0 refills | Status: DC

## 2021-09-09 NOTE — Progress Notes (Signed)
? ? [name], female    DOB: 1940/11/13, 81 y.o.   MRN: [id number] ? ?This visit was conducted in person. ? ?BP 124/72   Pulse 69   Temp 98 ?F (36.7 ?C) (Temporal)   Ht 5\' 3"  (1.6 m)   Wt 135 lb (61.2 kg)   SpO2 97% Comment: 4 L  BMI 23.91 kg/m?   ? ?CC: ER f/u visit  ?Subjective:  ? ?HPI: ?[name] [name] is a 81 y.o. female presenting on 09/09/2021 for Hospitalization Follow-up (Seen on 08/18/21 at [facility] ED, dx R side rib fx; fall.  Pt accompanied by daughter, [name] [name]. ) ? ? ?Fall 08/17/2021 at home, s/p evaluation at [facility] ER found to have right sided rib fractures, xrays negative for fracture of right thumb and left knee. Discharged on oxycodone 5/325mg  #20. Tripped when she got up to answer the phone. She continues incentive spirometry.  ? ?She notes easy fatigability and worsening exertional dyspnea since her fall.  ?Pain worse in the mornings, otherwise doing well. She is taking tylenol 1000mg  three times a day.  ?No longer taking opiate.  ? ?No cough "I can't cough" or increased mucous production.  ?Notes increased dyspnea and wheeze.  ? ?She continues taking prednisone 10mg  daily as well as brovana and pulmicort nebulizers twice daily. She has not been using spiriva. She uses albuterol inhaler/neb 3-4 times daily.  ? ?Recent fall was without premonitory symptoms but she notes persistent dizziness/unsteadiness on her feet.  ?   ? ?Relevant past medical, surgical, family and social history reviewed and updated as indicated. Interim medical history since our last visit reviewed. ?Allergies and medications reviewed and updated. ?Outpatient Medications Prior to Visit  ?Medication Sig Dispense Refill  ? albuterol (PROVENTIL) (2.5 MG/3ML) 0.083% nebulizer solution USE ONE VIAL IN NEBULIZER DAILY AS NEEDED FOR RESCUE 120 mL 6  ? albuterol (VENTOLIN HFA) 108 (90 Base) MCG/ACT inhaler INHALE 2 PUFFS EVERY 4 HOURS AS NEEDED 8.5 g 5  ? alendronate (FOSAMAX) 70 MG tablet Take 1 tablet (70 mg total)  by mouth once a week. Take with a full glass of water on an empty stomach. 4 tablet 11  ? arformoterol (BROVANA) 15 MCG/2ML NEBU USE 1 VIAL  IN  NEBULIZER TWICE  DAILY - (Morning And Evening) 1500 mL 11  ? budesonide (PULMICORT) 0.5 MG/2ML nebulizer solution INHALE 1 VIAL VIA NEBULIZER TWICE DAILY 120 mL 0  ? buPROPion (WELLBUTRIN SR) 150 MG 12 hr tablet TAKE ONE TABLET BY MOUTH TWICE DAILY 180 tablet 1  ? cholecalciferol (VITAMIN D) 1000 units tablet Take 2,000 Units by mouth daily.    ? conjugated estrogens (PREMARIN) vaginal cream Place 1 Applicatorful vaginally daily. Apply 0.5mg  (pea-sized amount)  just inside the vaginal introitus with a finger-tip on  Monday, Wednesday and Friday nights. (Patient taking differently: Place 1 Applicatorful vaginally as needed. Apply 0.5mg  (pea-sized amount)  just inside the vaginal introitus with a finger-tip on  Monday, Wednesday and Friday nights.) 30 g 12  ? CRANBERRY PO Take 2 tablets by mouth every morning.     ? ezetimibe (ZETIA) 10 MG tablet TAKE 1 TABLET BY MOUTH DAILY 90 tablet 0  ? hydrochlorothiazide (MICROZIDE) 12.5 MG capsule Take 1 capsule (12.5 mg total) by mouth daily. 30 capsule 4  ? losartan (COZAAR) 100 MG tablet TAKE 1 TABLET BY MOUTH DAILY 90 tablet 0  ? metoprolol tartrate (LOPRESSOR) 25 MG tablet TAKE ONE TABLET BY MOUTH TWICE DAILY 180 tablet 0  ?  mirtazapine (REMERON) 15 MG tablet TAKE ONE TABLET BY MOUTH AT BEDTIME 30 tablet 4  ? Multiple Vitamins-Minerals (PRESERVISION AREDS 2) CAPS Take 2 capsules by mouth daily.    ? pravastatin (PRAVACHOL) 80 MG tablet TAKE ONE TABLET BY MOUTH AT BEDTIME 90 tablet 0  ? predniSONE (DELTASONE) 10 MG tablet Take 4 tablets (40 mg total) by mouth daily with breakfast. 10 days (Patient taking differently: Take 10 mg by mouth daily with breakfast.) 30 tablet 6  ? tiotropium (SPIRIVA) 18 MCG inhalation capsule Place 1 capsule (18 mcg total) into inhaler and inhale daily. 90 capsule 2  ? oxyCODONE-acetaminophen (PERCOCET)  5-325 MG tablet Take 1 tablet by mouth every 6 (six) hours as needed for severe pain. 20 tablet 0  ? sulfamethoxazole-trimethoprim (BACTRIM DS) 800-160 MG tablet Take 1 tablet by mouth 2 (two) times daily. 10 tablet 0  ? ?No facility-administered medications prior to visit.  ?  ? ?Per HPI unless specifically indicated in ROS section below ?Review of Systems ? ?Objective:  ?BP 124/72   Pulse 69   Temp 98 ?F (36.7 ?C) (Temporal)   Ht 5\' 3"  (1.6 m)   Wt 135 lb (61.2 kg)   SpO2 97% Comment: 4 L  BMI 23.91 kg/m?   ?Wt Readings from Last 3 Encounters:  ?09/09/21 135 lb (61.2 kg)  ?08/18/21 125 lb (56.7 kg)  ?08/05/21 134 lb 8 oz (61 kg)  ?  ?  ?Physical Exam ?Vitals and nursing note reviewed.  ?Constitutional:   ?   Appearance: Normal appearance. She is not ill-appearing.  ?   Comments: [place] in place. Ambulates unassisted  ?HENT:  ?   Mouth/Throat:  ?   Mouth: Mucous membranes are dry.  ?   Pharynx: Oropharynx is clear. No oropharyngeal exudate or posterior oropharyngeal erythema.  ?Cardiovascular:  ?   Rate and Rhythm: Normal rate and regular rhythm.  ?   Pulses: Normal pulses.  ?   Heart sounds: Normal heart sounds. No murmur heard. ?Pulmonary:  ?   Breath sounds: Wheezing (diffuse expiratory throughout) present. No rhonchi or rales.  ?Chest:  ?   Chest wall: Tenderness (discomfort to palpation of anterior right chest wall) present.  ?Musculoskeletal:  ?   Right lower leg: No edema.  ?   Left lower leg: No edema.  ?Skin: ?   General: Skin is warm and dry.  ?Neurological:  ?   Mental Status: She is alert.  ?   Comments: Unsteady gait  ?Psychiatric:     ?   Mood and Affect: Mood normal.     ?   Behavior: Behavior normal.  ? ?   ? ?Assessment & Plan:  ? ?Problem List Items Addressed This Visit   ? ? GOLD 4 COPD with emphysema  ?  Chronic, severe, oxygen and prednisone dependent COPD.  ?She continues brovana and pulmicort nebulizers regularly and albuterol inh/neb PRN. She's been using albuterol 4-5 times per day. It  seems she's not using spiriva inhaler - rec restart this, consider switching to respimat.  ?See below.  ?She may be about to run out of daily low dose prednisone refill - I asked her to contact pulm for refills.  ? ?  ?  ? Relevant Medications  ? tiotropium (SPIRIVA) 18 MCG inhalation capsule  ? predniSONE (DELTASONE) 20 MG tablet  ? Other Relevant Orders  ? Ambulatory referral to Home Health  ? Chronic respiratory failure with hypoxia (HCC)  ? Relevant Orders  ? Ambulatory referral  to Home Health  ? COPD exacerbation ([place])  ?  Endorsing increasing exertional dyspnea, fatigability, and wheeze since fall with rib fracture. No increased cough or mucous production - severe COPD limits ability to expectorate. Will Rx as possible COPD exacerbation with zpack and prednisone burst. If not improving with this, rec sooner pulm f/u ([facility]).  ? ?  ?  ? Relevant Medications  ? tiotropium (SPIRIVA) 18 MCG inhalation capsule  ? predniSONE (DELTASONE) 20 MG tablet  ? Fall with injury - Primary  ?  Recent fall without premonitory symptoms resulting in R rib fractures however she is staying unsteady and endorses ongoing imbalance. Requests physical therapy referral - given fall risk she would qualify for home health evaluation - will refer.  ? ?  ?  ? Relevant Orders  ? Ambulatory referral to Home Health  ? Unsteadiness on feet  ?  Would benefit from PT evaluation, strengthening, fall prevention program.  ?She is largely homebound - will ask HHPT to evaluate her.  ? ?  ?  ? Relevant Orders  ? Ambulatory referral to Home Health  ? Right rib fracture  ?  No longer using opiates due to risk of side effects/ sedation. Continues tylenol 1000mg  TID with benefit. Will refer to [place].  ? ?  ?  ? Relevant Orders  ? Ambulatory referral to Home Health  ?  ? ?Meds ordered this encounter  ?Medications  ? tiotropium (SPIRIVA) 18 MCG inhalation capsule  ?  Sig: Place 1 capsule (18 mcg total) into inhaler and inhale daily.  ?  Dispense:  90 capsule   ?  Refill:  3  ? predniSONE (DELTASONE) 20 MG tablet  ?  Sig: Take 2 tablets (40 mg total) by mouth daily with breakfast.  ?  Dispense:  10 tablet  ?  Refill:  0  ? ?Orders Placed This Encounter  ?Procedures  ? [facility]

## 2021-09-09 NOTE — Patient Instructions (Addendum)
Call Dr Zoila Shutter office for refill of prednisone and to schedule follow up visit. ?Spiriva refilled - take nightly.  ?Take prednisone burst 40mg  for 5 days.  ?Continue incentive spirometry.  ?Let us know if not improved with this.  ?We will refer you to home health physical therapy for fall prevention evaluation.  ?

## 2021-09-10 ENCOUNTER — Telehealth: Payer: PPO

## 2021-09-10 ENCOUNTER — Telehealth: Payer: Self-pay | Admitting: Internal Medicine

## 2021-09-10 DIAGNOSIS — W19XXXA Unspecified fall, initial encounter: Secondary | ICD-10-CM | POA: Insufficient documentation

## 2021-09-10 DIAGNOSIS — R0689 Other abnormalities of breathing: Secondary | ICD-10-CM

## 2021-09-10 DIAGNOSIS — J9611 Chronic respiratory failure with hypoxia: Secondary | ICD-10-CM

## 2021-09-10 DIAGNOSIS — R2681 Unsteadiness on feet: Secondary | ICD-10-CM | POA: Insufficient documentation

## 2021-09-10 DIAGNOSIS — J449 Chronic obstructive pulmonary disease, unspecified: Secondary | ICD-10-CM

## 2021-09-10 MED ORDER — PREDNISONE 10 MG PO TABS: 10.0000 mg | ORAL_TABLET | Freq: Every day | ORAL | 1 refills | Status: DC

## 2021-09-10 NOTE — Assessment & Plan Note (Signed)
Recent fall without premonitory symptoms resulting in R rib fractures however she is staying unsteady and endorses ongoing imbalance. Requests physical therapy referral - given fall risk she would qualify for home health evaluation - will refer.  ?

## 2021-09-10 NOTE — Assessment & Plan Note (Addendum)
Chronic, severe, oxygen and prednisone dependent COPD.  ?She continues brovana and pulmicort nebulizers regularly and albuterol inh/neb PRN. She's been using albuterol 4-5 times per day. It seems she's not using spiriva inhaler - rec restart this, consider switching to respimat.  ?See below.  ?She may be about to run out of daily low dose prednisone refill - I asked her to contact pulm for refills.  ?

## 2021-09-10 NOTE — Assessment & Plan Note (Signed)
Endorsing increasing exertional dyspnea, fatigability, and wheeze since fall with rib fracture. No increased cough or mucous production - severe COPD limits ability to expectorate. Will Rx as possible COPD exacerbation with zpack and prednisone burst. If not improving with this, rec sooner pulm f/u ([facility]).  ?

## 2021-09-10 NOTE — Telephone Encounter (Signed)
Prednisone 10mg  sent to preferred pharmacy. ?Patient is aware and voiced her understanding.  ?Nothing further needed. ? ?

## 2021-09-11 DIAGNOSIS — S2231XA Fracture of one rib, right side, initial encounter for closed fracture: Secondary | ICD-10-CM | POA: Insufficient documentation

## 2021-09-11 NOTE — Assessment & Plan Note (Signed)
No longer using opiates due to risk of side effects/ sedation. Continues tylenol 1000mg  TID with benefit. Will refer to [place].  ?

## 2021-09-11 NOTE — Assessment & Plan Note (Signed)
Would benefit from PT evaluation, strengthening, fall prevention program.  ?She is largely homebound - will ask HHPT to evaluate her.  ?

## 2021-09-12 ENCOUNTER — Other Ambulatory Visit: Payer: Self-pay | Admitting: Internal Medicine

## 2021-09-16 ENCOUNTER — Other Ambulatory Visit: Payer: Self-pay | Admitting: Family Medicine

## 2021-10-01 ENCOUNTER — Other Ambulatory Visit: Payer: Self-pay | Admitting: Family Medicine

## 2021-10-01 DIAGNOSIS — J449 Chronic obstructive pulmonary disease, unspecified: Secondary | ICD-10-CM | POA: Diagnosis not present

## 2021-10-12 ENCOUNTER — Ambulatory Visit: Payer: PPO | Admitting: Primary Care

## 2021-10-13 ENCOUNTER — Ambulatory Visit
Admission: RE | Admit: 2021-10-13 | Discharge: 2021-10-13 | Disposition: A | Discharge status: Home / Self Care | Payer: PPO | Source: Ambulatory Visit | Attending: Adult Health | Admitting: Adult Health

## 2021-10-13 ENCOUNTER — Ambulatory Visit
Admission: RE | Admit: 2021-10-13 | Discharge: 2021-10-13 | Disposition: A | Discharge status: Home / Self Care | Payer: PPO | Attending: Adult Health | Admitting: Adult Health

## 2021-10-13 ENCOUNTER — Encounter: Payer: Self-pay | Admitting: Adult Health

## 2021-10-13 ENCOUNTER — Ambulatory Visit (INDEPENDENT_AMBULATORY_CARE_PROVIDER_SITE_OTHER): Payer: PPO | Admitting: Adult Health

## 2021-10-13 VITALS — BP 128/80 | HR 68 | Temp 97.8°F | Ht 63.0 in | Wt 134.2 lb

## 2021-10-13 DIAGNOSIS — J449 Chronic obstructive pulmonary disease, unspecified: Secondary | ICD-10-CM | POA: Diagnosis not present

## 2021-10-13 DIAGNOSIS — S2231XA Fracture of one rib, right side, initial encounter for closed fracture: Secondary | ICD-10-CM | POA: Diagnosis not present

## 2021-10-13 DIAGNOSIS — R0689 Other abnormalities of breathing: Secondary | ICD-10-CM | POA: Diagnosis not present

## 2021-10-13 DIAGNOSIS — J9611 Chronic respiratory failure with hypoxia: Secondary | ICD-10-CM | POA: Diagnosis not present

## 2021-10-13 DIAGNOSIS — J439 Emphysema, unspecified: Secondary | ICD-10-CM

## 2021-10-13 DIAGNOSIS — R0602 Shortness of breath: Secondary | ICD-10-CM | POA: Diagnosis not present

## 2021-10-13 DIAGNOSIS — Z23 Encounter for immunization: Secondary | ICD-10-CM | POA: Diagnosis not present

## 2021-10-13 MED ORDER — PREDNISONE 10 MG PO TABS: 10.0000 mg | ORAL_TABLET | Freq: Every day | ORAL | 1 refills | Status: DC

## 2021-10-13 NOTE — Assessment & Plan Note (Signed)
Very severe COPD that is oxygen and steroid-dependent.  Patient has completed pulmonary rehab in the past.  We discussed a repeat pulmonary rehab but she declines this.  Have advised her to do activity as tolerated to rest frequently.  Continue on her triple therapy maintenance regimen for now we will continue low-dose steroids as it does seem to give her some benefit with decreased clinical symptoms. Patient education given on chronic steroid use Prevnar 20 vaccine today  Plan  Patient Instructions  Mucinex Twice daily As needed  cough/congestion  Chest xray today  Continue on Brovana [employer] Twice daily   Continue on Spiriva daily .  Albuterol inhaler /neb As needed   Prednisone 10mg  daily .  Continue on Oxygen 4l/m .  Prevnar 20 vaccine today .  Follow up Dr. Mortimer Fries in 6 months and As needed   Please contact office for sooner follow up if symptoms do not improve or worsen or seek emergency care

## 2021-10-13 NOTE — Assessment & Plan Note (Signed)
Continue on oxygen to maintain O2 saturations greater than 88 to 90%  Plan  Patient Instructions  Mucinex Twice daily As needed  cough/congestion  Chest xray today  Continue on Brovana [employer] Twice daily   Continue on Spiriva daily .  Albuterol inhaler /neb As needed   Prednisone 10mg  daily .  Continue on Oxygen 4l/m .  Prevnar 20 vaccine today .  Follow up Dr. Mortimer Fries in 6 months and As needed   Please contact office for sooner follow up if symptoms do not improve or worsen or seek emergency care

## 2021-10-13 NOTE — Progress Notes (Signed)
@Patient  ID: [name] [name], female    DOB: 1940/12/19, 81 y.o.   MRN: [id number]  Chief Complaint  Patient presents with   Follow-up    Referring provider: Ria Bush, MD  HPI: 81 year old female former smoker followed for severe COPD and oxygen dependent respiratory failure History of lung cancer 1987 status post right lower lobectomy  TEST/EVENTS :  December 2013 simple spirometry ratio 39%, FEV1 0.82 L (30% predicted) 2014 Pulm Rehab 09/2012 6 MW > 1410 feet, O2 sat nadir 95% 4LNC 10/2014 CT chest > severe emphysema, no ILD, 3 vessel CAD 11/2015 6MW 1000 feet, O2 saturation nadir 94% 4LNC  10/13/2021 Follow up ; COPD , O2 RF  Patient presents for a 33-month follow-up.  Patient has underlying severe COPD and oxygen dependent respiratory failure.  She remains on triple maintenance regimen.  She is on Brovana, budesonide nebulizer twice daily.  She uses Spiriva daily.  She is on chronic steroids with prednisone 10 mg daily she remains on oxygen 4 L. Previously on 3l/m last year.  She lives at independent living.  Is no longer able to drive.   Does light activities but gets short of breath with heavy activities. Seems to be getting worse with decreased activity tolerance and increased shortness of breath with activities over last couple of years. COVID-vaccine is up-to-date.  Influenza vaccine is up-to-date.  We discussed Prevnar 20 booster today.    Allergies  Allergen Reactions   Keflex [Cephalexin] Shortness Of Breath   Penicillins     REACTION: hives    Immunization History  Administered Date(s) Administered   Fluad Quad(high Dose 65+) 03/05/2019   Influenza Split 03/27/2012   Influenza Whole 02/21/2006, 02/21/2008, 02/22/2011   Influenza, High Dose Seasonal PF 03/05/2021   Influenza,inj,Quad PF,6+ Mos 02/16/2013, 02/18/2014, 03/04/2015, 01/21/2016, 06/15/2016, 02/01/2018   Influenza-Unspecified 04/10/2020   Moderna Sars-Covid-2 Vaccination 07/10/2019, 08/07/2019,  04/10/2020   PNEUMOCOCCAL CONJUGATE-20 10/13/2021   Pfizer Covid-19 Vaccine Bivalent Booster 25yrs & up 03/05/2021   Pneumococcal Conjugate-13 02/18/2014   Pneumococcal Polysaccharide-23 02/10/2009   Td 05/25/1998, 09/18/2008   Zoster Recombinat (Shingrix) 04/03/2019   Zoster, Live 11/13/2012    Past Medical History:  Diagnosis Date   Chronic cystitis    recurrent UTI (4+ in last year), on suppressive doxy (uro [place])   Colon polyp    COPD, severe 01/27/07       Depression with anxiety    Diverticular disease 10/09/09   CT abd no sigmoid mass DDD L5/S1   Dyslipidemia    GERD (gastroesophageal reflux disease)    Hematuria    h/o kidney stones   Hiatal hernia    Hypertension    Left scapholunate ligament tear 2014   found on imaging   Lung cancer ([place]) 1987   Partial RLL Lobectomy.   Osteoarthritis    Osteopenia 08/2011   DEXA: L femur -1.6   Peptic ulcer    Spinal abscess ([place]) 2000   secondary to periodontal disease    Tobacco History: Social History   Tobacco Use  Smoking Status Former   Packs/day: 3.00   Years: 34.00   Pack years: 102.00   Types: Cigarettes   Quit date: 05/29/1995   Years since quitting: 26.3  Smokeless Tobacco Never   Counseling given: Not Answered   Outpatient Medications Prior to Visit  Medication Sig Dispense Refill   albuterol (PROVENTIL) (2.5 MG/3ML) 0.083% nebulizer solution USE ONE VIAL IN NEBULIZER DAILY AS NEEDED FOR RESCUE 120 mL 6  albuterol (VENTOLIN HFA) 108 (90 Base) MCG/ACT inhaler INHALE 2 PUFFS EVERY 4 HOURS AS NEEDED 8.5 g 5   alendronate (FOSAMAX) 70 MG tablet Take 1 tablet (70 mg total) by mouth once a week. Take with a full glass of water on an empty stomach. 4 tablet 11   arformoterol (BROVANA) 15 MCG/2ML NEBU USE 1 VIAL  IN  NEBULIZER TWICE  DAILY - (Morning And Evening) 1500 mL 11   budesonide (PULMICORT) 0.5 MG/2ML nebulizer solution INHALE 1 VIAL VIA NEBULIZER TWICE DAILY 120 mL 0   buPROPion (WELLBUTRIN SR) 150  MG 12 hr tablet TAKE ONE TABLET BY MOUTH TWICE DAILY 180 tablet 1   conjugated estrogens (PREMARIN) vaginal cream Place 1 Applicatorful vaginally daily. Apply 0.5mg  (pea-sized amount)  just inside the vaginal introitus with a finger-tip on  Monday, Wednesday and Friday nights. (Patient taking differently: Place 1 Applicatorful vaginally as needed. Apply 0.5mg  (pea-sized amount)  just inside the vaginal introitus with a finger-tip on  Monday, Wednesday and Friday nights.) 30 g 12   CRANBERRY PO Take 2 tablets by mouth every morning.      ezetimibe (ZETIA) 10 MG tablet TAKE 1 TABLET BY MOUTH DAILY 90 tablet 0   losartan (COZAAR) 100 MG tablet TAKE 1 TABLET BY MOUTH DAILY 90 tablet 0   metoprolol tartrate (LOPRESSOR) 25 MG tablet TAKE ONE TABLET BY MOUTH TWICE DAILY 180 tablet 3   mirtazapine (REMERON) 15 MG tablet TAKE ONE TABLET BY MOUTH AT BEDTIME 30 tablet 4   Multiple Vitamins-Minerals (PRESERVISION AREDS 2) CAPS Take 2 capsules by mouth daily.     pravastatin (PRAVACHOL) 80 MG tablet TAKE ONE TABLET BY MOUTH AT BEDTIME 90 tablet 3   tiotropium (SPIRIVA) 18 MCG inhalation capsule Place 1 capsule (18 mcg total) into inhaler and inhale daily. 90 capsule 3   cholecalciferol (VITAMIN D) 1000 units tablet Take 2,000 Units by mouth daily.     hydrochlorothiazide (MICROZIDE) 12.5 MG capsule Take 1 capsule (12.5 mg total) by mouth daily. 30 capsule 4   predniSONE (DELTASONE) 10 MG tablet Take 1 tablet (10 mg total) by mouth daily with breakfast. 30 tablet 1   predniSONE (DELTASONE) 20 MG tablet Take 2 tablets (40 mg total) by mouth daily with breakfast. 10 tablet 0   No facility-administered medications prior to visit.     Review of Systems:   Constitutional:   No  weight loss, night sweats,  Fevers, chills,+ fatigue, or  lassitude.  HEENT:   No headaches,  Difficulty swallowing,  Tooth/dental problems, or  Sore throat,                No sneezing, itching, ear ache, nasal congestion, post nasal  drip,   CV:  No chest pain,  Orthopnea, PND, swelling in lower extremities, anasarca, dizziness, palpitations, syncope.   GI  No heartburn, indigestion, abdominal pain, nausea, vomiting, diarrhea, change in bowel habits, loss of appetite, bloody stools.   Resp:   No chest wall deformity  Skin: no rash or lesions.  GU: no dysuria, change in color of urine, no urgency or frequency.  No flank pain, no hematuria   MS:  No joint pain or swelling.  No decreased range of motion.  No back pain.    Physical Exam  BP 128/80 (BP Location: Left Arm, Cuff Size: Normal)   Pulse 68   Temp 97.8 F (36.6 C) (Temporal)   Ht 5\' 3"  (1.6 m)   Wt 134 lb 3.2 oz (60.9  kg)   SpO2 91%   BMI 23.77 kg/m   GEN: A/Ox3; pleasant , NAD, chronically ill-appearing on oxygen in wheelchair   HEENT:  [place]/AT,  EACs-clear, TMs-wnl, NOSE-clear, THROAT-clear, no lesions, no postnasal drip or exudate noted.   NECK:  Supple w/ fair ROM; no JVD; normal carotid impulses w/o bruits; no thyromegaly or nodules palpated; no lymphadenopathy.    RESP  Clear  P & A; w/o, wheezes/ rales/ or rhonchi. no accessory muscle use, no dullness to percussion  CARD:  RRR, no m/r/g, no peripheral edema, pulses intact, no cyanosis or clubbing.  GI:   Soft & nt; nml bowel sounds; no organomegaly or masses detected.   Musco: Warm bil, no deformities or joint swelling noted.   Neuro: alert, no focal deficits noted.    Skin: Warm, no lesions or rashes  December 2013 simple spirometry ratio 39%, FEV1 0.82 L (30% predicted)  Lab Results:  CBC Imaging: No results found.        View : No data to display.          No results found for: NITRICOXIDE      Assessment & Plan:   GOLD 4 COPD with emphysema Very severe COPD that is oxygen and steroid-dependent.  Patient has completed pulmonary rehab in the past.  We discussed a repeat pulmonary rehab but she declines this.  Have advised her to do activity as tolerated to rest  frequently.  Continue on her triple therapy maintenance regimen for now we will continue low-dose steroids as it does seem to give her some benefit with decreased clinical symptoms. Patient education given on chronic steroid use Prevnar 20 vaccine today  Plan  Patient Instructions  Mucinex Twice daily As needed  cough/congestion  Chest xray today  Continue on Brovana [employer] Twice daily   Continue on Spiriva daily .  Albuterol inhaler /neb As needed   Prednisone 10mg  daily .  Continue on Oxygen 4l/m .  Prevnar 20 vaccine today .  Follow up Dr. Mortimer Fries in 6 months and As needed   Please contact office for sooner follow up if symptoms do not improve or worsen or seek emergency care        Chronic respiratory failure with hypoxia ([place]) Continue on oxygen to maintain O2 saturations greater than 88 to 90%  Plan  Patient Instructions  Mucinex Twice daily As needed  cough/congestion  Chest xray today  Continue on Brovana [employer] Twice daily   Continue on Spiriva daily .  Albuterol inhaler /neb As needed   Prednisone 10mg  daily .  Continue on Oxygen 4l/m .  Prevnar 20 vaccine today .  Follow up Dr. Mortimer Fries in 6 months and As needed   Please contact office for sooner follow up if symptoms do not improve or worsen or seek emergency care          Rexene Edison, NP 10/13/2021

## 2021-10-13 NOTE — Patient Instructions (Signed)
Mucinex Twice daily As needed  cough/congestion  Chest xray today  Continue on Brovana [employer] Twice daily   Continue on Spiriva daily .  Albuterol inhaler /neb As needed   Prednisone 10mg  daily .  Continue on Oxygen 4l/m .  Prevnar 20 vaccine today .  Follow up Dr. Mortimer Fries in 6 months and As needed   Please contact office for sooner follow up if symptoms do not improve or worsen or seek emergency care

## 2021-10-14 ENCOUNTER — Other Ambulatory Visit: Payer: Self-pay | Admitting: Family Medicine

## 2021-10-14 DIAGNOSIS — E785 Hyperlipidemia, unspecified: Secondary | ICD-10-CM

## 2021-10-16 DIAGNOSIS — S2241XA Multiple fractures of ribs, right side, initial encounter for closed fracture: Secondary | ICD-10-CM | POA: Diagnosis not present

## 2021-10-20 ENCOUNTER — Other Ambulatory Visit: Payer: Self-pay | Admitting: Family Medicine

## 2021-10-20 ENCOUNTER — Telehealth: Payer: Self-pay | Admitting: Family Medicine

## 2021-10-20 NOTE — Telephone Encounter (Signed)
Agree with this. Thanks.  

## 2021-10-20 NOTE — Telephone Encounter (Signed)
[place] Name: [facility] Agency Name: [facility] Phone #: [phone], Direct line and secure VM Service Requested: PT (examples: OT/PT/Skilled Nursing/Social Work/Speech Therapy/Wound Care) Frequency of Visits: Twice a week for 4 weeks and then Once a week for 4 weeks

## 2021-10-21 NOTE — Telephone Encounter (Signed)
Left message on voicemail for [place] to call the office back.

## 2021-10-22 ENCOUNTER — Other Ambulatory Visit: Payer: Self-pay | Admitting: Family Medicine

## 2021-10-22 DIAGNOSIS — S2241XA Multiple fractures of ribs, right side, initial encounter for closed fracture: Secondary | ICD-10-CM | POA: Diagnosis not present

## 2021-10-22 NOTE — Telephone Encounter (Addendum)
Lvm (on confidential vm) informing [place] Bells Dr. Darnell Level is giving verbal orders for services requested.

## 2021-10-23 DIAGNOSIS — S2241XA Multiple fractures of ribs, right side, initial encounter for closed fracture: Secondary | ICD-10-CM | POA: Diagnosis not present

## 2021-10-23 NOTE — Telephone Encounter (Signed)
Refill request Remeron Last refill 07/04/21 #30/4 Last office visit 09/09/21

## 2021-10-26 ENCOUNTER — Other Ambulatory Visit: Payer: Self-pay | Admitting: Internal Medicine

## 2021-10-26 DIAGNOSIS — S2241XA Multiple fractures of ribs, right side, initial encounter for closed fracture: Secondary | ICD-10-CM | POA: Diagnosis not present

## 2021-10-28 DIAGNOSIS — Z9181 History of falling: Secondary | ICD-10-CM | POA: Diagnosis not present

## 2021-10-28 DIAGNOSIS — G500 Trigeminal neuralgia: Secondary | ICD-10-CM | POA: Diagnosis not present

## 2021-10-28 DIAGNOSIS — Z7983 Long term (current) use of bisphosphonates: Secondary | ICD-10-CM | POA: Diagnosis not present

## 2021-10-28 DIAGNOSIS — Z792 Long term (current) use of antibiotics: Secondary | ICD-10-CM | POA: Diagnosis not present

## 2021-10-28 DIAGNOSIS — I739 Peripheral vascular disease, unspecified: Secondary | ICD-10-CM | POA: Diagnosis not present

## 2021-10-28 DIAGNOSIS — Z7951 Long term (current) use of inhaled steroids: Secondary | ICD-10-CM | POA: Diagnosis not present

## 2021-10-28 DIAGNOSIS — K529 Noninfective gastroenteritis and colitis, unspecified: Secondary | ICD-10-CM | POA: Diagnosis not present

## 2021-10-28 DIAGNOSIS — M8580 Other specified disorders of bone density and structure, unspecified site: Secondary | ICD-10-CM | POA: Diagnosis not present

## 2021-10-28 DIAGNOSIS — W010XXD Fall on same level from slipping, tripping and stumbling without subsequent striking against object, subsequent encounter: Secondary | ICD-10-CM | POA: Diagnosis not present

## 2021-10-28 DIAGNOSIS — N3941 Urge incontinence: Secondary | ICD-10-CM | POA: Diagnosis not present

## 2021-10-28 DIAGNOSIS — J9611 Chronic respiratory failure with hypoxia: Secondary | ICD-10-CM | POA: Diagnosis not present

## 2021-10-28 DIAGNOSIS — Z7952 Long term (current) use of systemic steroids: Secondary | ICD-10-CM | POA: Diagnosis not present

## 2021-10-28 DIAGNOSIS — R1310 Dysphagia, unspecified: Secondary | ICD-10-CM | POA: Diagnosis not present

## 2021-10-28 DIAGNOSIS — R490 Dysphonia: Secondary | ICD-10-CM | POA: Diagnosis not present

## 2021-10-28 DIAGNOSIS — R739 Hyperglycemia, unspecified: Secondary | ICD-10-CM | POA: Diagnosis not present

## 2021-10-28 DIAGNOSIS — E785 Hyperlipidemia, unspecified: Secondary | ICD-10-CM | POA: Diagnosis not present

## 2021-10-28 DIAGNOSIS — J439 Emphysema, unspecified: Secondary | ICD-10-CM | POA: Diagnosis not present

## 2021-10-28 DIAGNOSIS — Z79890 Hormone replacement therapy (postmenopausal): Secondary | ICD-10-CM | POA: Diagnosis not present

## 2021-10-28 DIAGNOSIS — I10 Essential (primary) hypertension: Secondary | ICD-10-CM | POA: Diagnosis not present

## 2021-10-28 DIAGNOSIS — I2510 Atherosclerotic heart disease of native coronary artery without angina pectoris: Secondary | ICD-10-CM | POA: Diagnosis not present

## 2021-10-28 DIAGNOSIS — K219 Gastro-esophageal reflux disease without esophagitis: Secondary | ICD-10-CM | POA: Diagnosis not present

## 2021-10-28 DIAGNOSIS — I951 Orthostatic hypotension: Secondary | ICD-10-CM | POA: Diagnosis not present

## 2021-10-28 DIAGNOSIS — S2241XD Multiple fractures of ribs, right side, subsequent encounter for fracture with routine healing: Secondary | ICD-10-CM | POA: Diagnosis not present

## 2021-10-28 DIAGNOSIS — I700 Atherosclerosis of aorta: Secondary | ICD-10-CM | POA: Diagnosis not present

## 2021-10-29 ENCOUNTER — Other Ambulatory Visit: Payer: Self-pay | Admitting: Internal Medicine

## 2021-11-03 DIAGNOSIS — N3941 Urge incontinence: Secondary | ICD-10-CM | POA: Diagnosis not present

## 2021-11-03 DIAGNOSIS — I739 Peripheral vascular disease, unspecified: Secondary | ICD-10-CM | POA: Diagnosis not present

## 2021-11-03 DIAGNOSIS — G500 Trigeminal neuralgia: Secondary | ICD-10-CM | POA: Diagnosis not present

## 2021-11-03 DIAGNOSIS — K529 Noninfective gastroenteritis and colitis, unspecified: Secondary | ICD-10-CM | POA: Diagnosis not present

## 2021-11-03 DIAGNOSIS — K219 Gastro-esophageal reflux disease without esophagitis: Secondary | ICD-10-CM | POA: Diagnosis not present

## 2021-11-03 DIAGNOSIS — Z7952 Long term (current) use of systemic steroids: Secondary | ICD-10-CM | POA: Diagnosis not present

## 2021-11-03 DIAGNOSIS — I10 Essential (primary) hypertension: Secondary | ICD-10-CM | POA: Diagnosis not present

## 2021-11-03 DIAGNOSIS — I700 Atherosclerosis of aorta: Secondary | ICD-10-CM | POA: Diagnosis not present

## 2021-11-03 DIAGNOSIS — W010XXD Fall on same level from slipping, tripping and stumbling without subsequent striking against object, subsequent encounter: Secondary | ICD-10-CM | POA: Diagnosis not present

## 2021-11-03 DIAGNOSIS — M8580 Other specified disorders of bone density and structure, unspecified site: Secondary | ICD-10-CM | POA: Diagnosis not present

## 2021-11-03 DIAGNOSIS — R739 Hyperglycemia, unspecified: Secondary | ICD-10-CM | POA: Diagnosis not present

## 2021-11-03 DIAGNOSIS — J439 Emphysema, unspecified: Secondary | ICD-10-CM | POA: Diagnosis not present

## 2021-11-03 DIAGNOSIS — I951 Orthostatic hypotension: Secondary | ICD-10-CM | POA: Diagnosis not present

## 2021-11-03 DIAGNOSIS — R490 Dysphonia: Secondary | ICD-10-CM | POA: Diagnosis not present

## 2021-11-03 DIAGNOSIS — Z792 Long term (current) use of antibiotics: Secondary | ICD-10-CM | POA: Diagnosis not present

## 2021-11-03 DIAGNOSIS — R1310 Dysphagia, unspecified: Secondary | ICD-10-CM | POA: Diagnosis not present

## 2021-11-03 DIAGNOSIS — Z9181 History of falling: Secondary | ICD-10-CM | POA: Diagnosis not present

## 2021-11-03 DIAGNOSIS — E785 Hyperlipidemia, unspecified: Secondary | ICD-10-CM | POA: Diagnosis not present

## 2021-11-03 DIAGNOSIS — Z7951 Long term (current) use of inhaled steroids: Secondary | ICD-10-CM | POA: Diagnosis not present

## 2021-11-03 DIAGNOSIS — Z79890 Hormone replacement therapy (postmenopausal): Secondary | ICD-10-CM | POA: Diagnosis not present

## 2021-11-03 DIAGNOSIS — Z7983 Long term (current) use of bisphosphonates: Secondary | ICD-10-CM | POA: Diagnosis not present

## 2021-11-03 DIAGNOSIS — I2510 Atherosclerotic heart disease of native coronary artery without angina pectoris: Secondary | ICD-10-CM | POA: Diagnosis not present

## 2021-11-03 DIAGNOSIS — S2241XD Multiple fractures of ribs, right side, subsequent encounter for fracture with routine healing: Secondary | ICD-10-CM | POA: Diagnosis not present

## 2021-11-03 DIAGNOSIS — J9611 Chronic respiratory failure with hypoxia: Secondary | ICD-10-CM | POA: Diagnosis not present

## 2021-11-05 DIAGNOSIS — Z7952 Long term (current) use of systemic steroids: Secondary | ICD-10-CM | POA: Diagnosis not present

## 2021-11-05 DIAGNOSIS — Z7983 Long term (current) use of bisphosphonates: Secondary | ICD-10-CM | POA: Diagnosis not present

## 2021-11-05 DIAGNOSIS — G500 Trigeminal neuralgia: Secondary | ICD-10-CM | POA: Diagnosis not present

## 2021-11-05 DIAGNOSIS — Z9181 History of falling: Secondary | ICD-10-CM | POA: Diagnosis not present

## 2021-11-05 DIAGNOSIS — R1310 Dysphagia, unspecified: Secondary | ICD-10-CM | POA: Diagnosis not present

## 2021-11-05 DIAGNOSIS — J9611 Chronic respiratory failure with hypoxia: Secondary | ICD-10-CM | POA: Diagnosis not present

## 2021-11-05 DIAGNOSIS — R739 Hyperglycemia, unspecified: Secondary | ICD-10-CM | POA: Diagnosis not present

## 2021-11-05 DIAGNOSIS — M8580 Other specified disorders of bone density and structure, unspecified site: Secondary | ICD-10-CM | POA: Diagnosis not present

## 2021-11-05 DIAGNOSIS — I700 Atherosclerosis of aorta: Secondary | ICD-10-CM | POA: Diagnosis not present

## 2021-11-05 DIAGNOSIS — E785 Hyperlipidemia, unspecified: Secondary | ICD-10-CM | POA: Diagnosis not present

## 2021-11-05 DIAGNOSIS — Z79890 Hormone replacement therapy (postmenopausal): Secondary | ICD-10-CM | POA: Diagnosis not present

## 2021-11-05 DIAGNOSIS — R490 Dysphonia: Secondary | ICD-10-CM | POA: Diagnosis not present

## 2021-11-05 DIAGNOSIS — W010XXD Fall on same level from slipping, tripping and stumbling without subsequent striking against object, subsequent encounter: Secondary | ICD-10-CM | POA: Diagnosis not present

## 2021-11-05 DIAGNOSIS — Z7951 Long term (current) use of inhaled steroids: Secondary | ICD-10-CM | POA: Diagnosis not present

## 2021-11-05 DIAGNOSIS — I739 Peripheral vascular disease, unspecified: Secondary | ICD-10-CM | POA: Diagnosis not present

## 2021-11-05 DIAGNOSIS — I951 Orthostatic hypotension: Secondary | ICD-10-CM | POA: Diagnosis not present

## 2021-11-05 DIAGNOSIS — K529 Noninfective gastroenteritis and colitis, unspecified: Secondary | ICD-10-CM | POA: Diagnosis not present

## 2021-11-05 DIAGNOSIS — K219 Gastro-esophageal reflux disease without esophagitis: Secondary | ICD-10-CM | POA: Diagnosis not present

## 2021-11-05 DIAGNOSIS — J439 Emphysema, unspecified: Secondary | ICD-10-CM | POA: Diagnosis not present

## 2021-11-05 DIAGNOSIS — I2510 Atherosclerotic heart disease of native coronary artery without angina pectoris: Secondary | ICD-10-CM | POA: Diagnosis not present

## 2021-11-05 DIAGNOSIS — N3941 Urge incontinence: Secondary | ICD-10-CM | POA: Diagnosis not present

## 2021-11-05 DIAGNOSIS — Z792 Long term (current) use of antibiotics: Secondary | ICD-10-CM | POA: Diagnosis not present

## 2021-11-05 DIAGNOSIS — S2241XD Multiple fractures of ribs, right side, subsequent encounter for fracture with routine healing: Secondary | ICD-10-CM | POA: Diagnosis not present

## 2021-11-05 DIAGNOSIS — I10 Essential (primary) hypertension: Secondary | ICD-10-CM | POA: Diagnosis not present

## 2021-11-09 DIAGNOSIS — I2510 Atherosclerotic heart disease of native coronary artery without angina pectoris: Secondary | ICD-10-CM | POA: Diagnosis not present

## 2021-11-09 DIAGNOSIS — R490 Dysphonia: Secondary | ICD-10-CM | POA: Diagnosis not present

## 2021-11-09 DIAGNOSIS — E785 Hyperlipidemia, unspecified: Secondary | ICD-10-CM | POA: Diagnosis not present

## 2021-11-09 DIAGNOSIS — I739 Peripheral vascular disease, unspecified: Secondary | ICD-10-CM | POA: Diagnosis not present

## 2021-11-09 DIAGNOSIS — M8580 Other specified disorders of bone density and structure, unspecified site: Secondary | ICD-10-CM | POA: Diagnosis not present

## 2021-11-09 DIAGNOSIS — I700 Atherosclerosis of aorta: Secondary | ICD-10-CM | POA: Diagnosis not present

## 2021-11-09 DIAGNOSIS — I951 Orthostatic hypotension: Secondary | ICD-10-CM | POA: Diagnosis not present

## 2021-11-09 DIAGNOSIS — R1310 Dysphagia, unspecified: Secondary | ICD-10-CM | POA: Diagnosis not present

## 2021-11-09 DIAGNOSIS — I10 Essential (primary) hypertension: Secondary | ICD-10-CM | POA: Diagnosis not present

## 2021-11-09 DIAGNOSIS — Z792 Long term (current) use of antibiotics: Secondary | ICD-10-CM | POA: Diagnosis not present

## 2021-11-09 DIAGNOSIS — Z7952 Long term (current) use of systemic steroids: Secondary | ICD-10-CM | POA: Diagnosis not present

## 2021-11-09 DIAGNOSIS — J9611 Chronic respiratory failure with hypoxia: Secondary | ICD-10-CM | POA: Diagnosis not present

## 2021-11-09 DIAGNOSIS — Z79890 Hormone replacement therapy (postmenopausal): Secondary | ICD-10-CM | POA: Diagnosis not present

## 2021-11-09 DIAGNOSIS — R739 Hyperglycemia, unspecified: Secondary | ICD-10-CM | POA: Diagnosis not present

## 2021-11-09 DIAGNOSIS — Z7983 Long term (current) use of bisphosphonates: Secondary | ICD-10-CM | POA: Diagnosis not present

## 2021-11-09 DIAGNOSIS — K529 Noninfective gastroenteritis and colitis, unspecified: Secondary | ICD-10-CM | POA: Diagnosis not present

## 2021-11-09 DIAGNOSIS — S2241XD Multiple fractures of ribs, right side, subsequent encounter for fracture with routine healing: Secondary | ICD-10-CM | POA: Diagnosis not present

## 2021-11-09 DIAGNOSIS — Z7951 Long term (current) use of inhaled steroids: Secondary | ICD-10-CM | POA: Diagnosis not present

## 2021-11-09 DIAGNOSIS — J439 Emphysema, unspecified: Secondary | ICD-10-CM | POA: Diagnosis not present

## 2021-11-09 DIAGNOSIS — Z9181 History of falling: Secondary | ICD-10-CM | POA: Diagnosis not present

## 2021-11-09 DIAGNOSIS — G500 Trigeminal neuralgia: Secondary | ICD-10-CM | POA: Diagnosis not present

## 2021-11-09 DIAGNOSIS — K219 Gastro-esophageal reflux disease without esophagitis: Secondary | ICD-10-CM | POA: Diagnosis not present

## 2021-11-09 DIAGNOSIS — W010XXD Fall on same level from slipping, tripping and stumbling without subsequent striking against object, subsequent encounter: Secondary | ICD-10-CM | POA: Diagnosis not present

## 2021-11-09 DIAGNOSIS — N3941 Urge incontinence: Secondary | ICD-10-CM | POA: Diagnosis not present

## 2021-11-12 DIAGNOSIS — J439 Emphysema, unspecified: Secondary | ICD-10-CM | POA: Diagnosis not present

## 2021-11-12 DIAGNOSIS — Z7951 Long term (current) use of inhaled steroids: Secondary | ICD-10-CM | POA: Diagnosis not present

## 2021-11-12 DIAGNOSIS — N3941 Urge incontinence: Secondary | ICD-10-CM | POA: Diagnosis not present

## 2021-11-12 DIAGNOSIS — M8580 Other specified disorders of bone density and structure, unspecified site: Secondary | ICD-10-CM | POA: Diagnosis not present

## 2021-11-12 DIAGNOSIS — I10 Essential (primary) hypertension: Secondary | ICD-10-CM | POA: Diagnosis not present

## 2021-11-12 DIAGNOSIS — G500 Trigeminal neuralgia: Secondary | ICD-10-CM | POA: Diagnosis not present

## 2021-11-12 DIAGNOSIS — K529 Noninfective gastroenteritis and colitis, unspecified: Secondary | ICD-10-CM | POA: Diagnosis not present

## 2021-11-12 DIAGNOSIS — Z79890 Hormone replacement therapy (postmenopausal): Secondary | ICD-10-CM | POA: Diagnosis not present

## 2021-11-12 DIAGNOSIS — W010XXD Fall on same level from slipping, tripping and stumbling without subsequent striking against object, subsequent encounter: Secondary | ICD-10-CM | POA: Diagnosis not present

## 2021-11-12 DIAGNOSIS — R1310 Dysphagia, unspecified: Secondary | ICD-10-CM | POA: Diagnosis not present

## 2021-11-12 DIAGNOSIS — S2241XD Multiple fractures of ribs, right side, subsequent encounter for fracture with routine healing: Secondary | ICD-10-CM | POA: Diagnosis not present

## 2021-11-12 DIAGNOSIS — R490 Dysphonia: Secondary | ICD-10-CM | POA: Diagnosis not present

## 2021-11-12 DIAGNOSIS — Z9181 History of falling: Secondary | ICD-10-CM | POA: Diagnosis not present

## 2021-11-12 DIAGNOSIS — J9611 Chronic respiratory failure with hypoxia: Secondary | ICD-10-CM | POA: Diagnosis not present

## 2021-11-12 DIAGNOSIS — I951 Orthostatic hypotension: Secondary | ICD-10-CM | POA: Diagnosis not present

## 2021-11-12 DIAGNOSIS — K219 Gastro-esophageal reflux disease without esophagitis: Secondary | ICD-10-CM | POA: Diagnosis not present

## 2021-11-12 DIAGNOSIS — I2510 Atherosclerotic heart disease of native coronary artery without angina pectoris: Secondary | ICD-10-CM | POA: Diagnosis not present

## 2021-11-12 DIAGNOSIS — E785 Hyperlipidemia, unspecified: Secondary | ICD-10-CM | POA: Diagnosis not present

## 2021-11-12 DIAGNOSIS — Z7983 Long term (current) use of bisphosphonates: Secondary | ICD-10-CM | POA: Diagnosis not present

## 2021-11-12 DIAGNOSIS — R739 Hyperglycemia, unspecified: Secondary | ICD-10-CM | POA: Diagnosis not present

## 2021-11-12 DIAGNOSIS — I700 Atherosclerosis of aorta: Secondary | ICD-10-CM | POA: Diagnosis not present

## 2021-11-12 DIAGNOSIS — I739 Peripheral vascular disease, unspecified: Secondary | ICD-10-CM | POA: Diagnosis not present

## 2021-11-12 DIAGNOSIS — Z7952 Long term (current) use of systemic steroids: Secondary | ICD-10-CM | POA: Diagnosis not present

## 2021-11-12 DIAGNOSIS — Z792 Long term (current) use of antibiotics: Secondary | ICD-10-CM | POA: Diagnosis not present

## 2021-11-13 DIAGNOSIS — I951 Orthostatic hypotension: Secondary | ICD-10-CM | POA: Diagnosis not present

## 2021-11-13 DIAGNOSIS — Z9181 History of falling: Secondary | ICD-10-CM

## 2021-11-13 DIAGNOSIS — Z8701 Personal history of pneumonia (recurrent): Secondary | ICD-10-CM

## 2021-11-13 DIAGNOSIS — E785 Hyperlipidemia, unspecified: Secondary | ICD-10-CM | POA: Diagnosis not present

## 2021-11-13 DIAGNOSIS — Z7951 Long term (current) use of inhaled steroids: Secondary | ICD-10-CM

## 2021-11-13 DIAGNOSIS — K529 Noninfective gastroenteritis and colitis, unspecified: Secondary | ICD-10-CM | POA: Diagnosis not present

## 2021-11-13 DIAGNOSIS — Z86010 Personal history of colonic polyps: Secondary | ICD-10-CM

## 2021-11-13 DIAGNOSIS — Z79890 Hormone replacement therapy (postmenopausal): Secondary | ICD-10-CM

## 2021-11-13 DIAGNOSIS — I2510 Atherosclerotic heart disease of native coronary artery without angina pectoris: Secondary | ICD-10-CM | POA: Diagnosis not present

## 2021-11-13 DIAGNOSIS — Z7983 Long term (current) use of bisphosphonates: Secondary | ICD-10-CM

## 2021-11-13 DIAGNOSIS — Z9981 Dependence on supplemental oxygen: Secondary | ICD-10-CM

## 2021-11-13 DIAGNOSIS — R1310 Dysphagia, unspecified: Secondary | ICD-10-CM

## 2021-11-13 DIAGNOSIS — N3941 Urge incontinence: Secondary | ICD-10-CM

## 2021-11-13 DIAGNOSIS — R490 Dysphonia: Secondary | ICD-10-CM

## 2021-11-13 DIAGNOSIS — R739 Hyperglycemia, unspecified: Secondary | ICD-10-CM

## 2021-11-13 DIAGNOSIS — I739 Peripheral vascular disease, unspecified: Secondary | ICD-10-CM | POA: Diagnosis not present

## 2021-11-13 DIAGNOSIS — Z792 Long term (current) use of antibiotics: Secondary | ICD-10-CM

## 2021-11-13 DIAGNOSIS — W010XXD Fall on same level from slipping, tripping and stumbling without subsequent striking against object, subsequent encounter: Secondary | ICD-10-CM | POA: Diagnosis not present

## 2021-11-13 DIAGNOSIS — I700 Atherosclerosis of aorta: Secondary | ICD-10-CM | POA: Diagnosis not present

## 2021-11-13 DIAGNOSIS — M8580 Other specified disorders of bone density and structure, unspecified site: Secondary | ICD-10-CM

## 2021-11-13 DIAGNOSIS — Z7952 Long term (current) use of systemic steroids: Secondary | ICD-10-CM

## 2021-11-13 DIAGNOSIS — J439 Emphysema, unspecified: Secondary | ICD-10-CM | POA: Diagnosis not present

## 2021-11-13 DIAGNOSIS — I10 Essential (primary) hypertension: Secondary | ICD-10-CM | POA: Diagnosis not present

## 2021-11-13 DIAGNOSIS — Z87891 Personal history of nicotine dependence: Secondary | ICD-10-CM

## 2021-11-13 DIAGNOSIS — S2241XD Multiple fractures of ribs, right side, subsequent encounter for fracture with routine healing: Secondary | ICD-10-CM | POA: Diagnosis not present

## 2021-11-13 DIAGNOSIS — K219 Gastro-esophageal reflux disease without esophagitis: Secondary | ICD-10-CM

## 2021-11-13 DIAGNOSIS — G500 Trigeminal neuralgia: Secondary | ICD-10-CM | POA: Diagnosis not present

## 2021-11-13 DIAGNOSIS — J9611 Chronic respiratory failure with hypoxia: Secondary | ICD-10-CM | POA: Diagnosis not present

## 2021-11-13 DIAGNOSIS — Z87440 Personal history of urinary (tract) infections: Secondary | ICD-10-CM

## 2021-11-16 DIAGNOSIS — R739 Hyperglycemia, unspecified: Secondary | ICD-10-CM | POA: Diagnosis not present

## 2021-11-16 DIAGNOSIS — I700 Atherosclerosis of aorta: Secondary | ICD-10-CM | POA: Diagnosis not present

## 2021-11-16 DIAGNOSIS — J9611 Chronic respiratory failure with hypoxia: Secondary | ICD-10-CM | POA: Diagnosis not present

## 2021-11-16 DIAGNOSIS — Z7951 Long term (current) use of inhaled steroids: Secondary | ICD-10-CM | POA: Diagnosis not present

## 2021-11-16 DIAGNOSIS — Z792 Long term (current) use of antibiotics: Secondary | ICD-10-CM | POA: Diagnosis not present

## 2021-11-16 DIAGNOSIS — G500 Trigeminal neuralgia: Secondary | ICD-10-CM | POA: Diagnosis not present

## 2021-11-16 DIAGNOSIS — M8580 Other specified disorders of bone density and structure, unspecified site: Secondary | ICD-10-CM | POA: Diagnosis not present

## 2021-11-16 DIAGNOSIS — Z9181 History of falling: Secondary | ICD-10-CM | POA: Diagnosis not present

## 2021-11-16 DIAGNOSIS — K219 Gastro-esophageal reflux disease without esophagitis: Secondary | ICD-10-CM | POA: Diagnosis not present

## 2021-11-16 DIAGNOSIS — I10 Essential (primary) hypertension: Secondary | ICD-10-CM | POA: Diagnosis not present

## 2021-11-16 DIAGNOSIS — J439 Emphysema, unspecified: Secondary | ICD-10-CM | POA: Diagnosis not present

## 2021-11-16 DIAGNOSIS — I739 Peripheral vascular disease, unspecified: Secondary | ICD-10-CM | POA: Diagnosis not present

## 2021-11-16 DIAGNOSIS — R1310 Dysphagia, unspecified: Secondary | ICD-10-CM | POA: Diagnosis not present

## 2021-11-16 DIAGNOSIS — N3941 Urge incontinence: Secondary | ICD-10-CM | POA: Diagnosis not present

## 2021-11-16 DIAGNOSIS — I2510 Atherosclerotic heart disease of native coronary artery without angina pectoris: Secondary | ICD-10-CM | POA: Diagnosis not present

## 2021-11-16 DIAGNOSIS — Z79890 Hormone replacement therapy (postmenopausal): Secondary | ICD-10-CM | POA: Diagnosis not present

## 2021-11-16 DIAGNOSIS — Z7952 Long term (current) use of systemic steroids: Secondary | ICD-10-CM | POA: Diagnosis not present

## 2021-11-16 DIAGNOSIS — R490 Dysphonia: Secondary | ICD-10-CM | POA: Diagnosis not present

## 2021-11-16 DIAGNOSIS — Z7983 Long term (current) use of bisphosphonates: Secondary | ICD-10-CM | POA: Diagnosis not present

## 2021-11-16 DIAGNOSIS — E785 Hyperlipidemia, unspecified: Secondary | ICD-10-CM | POA: Diagnosis not present

## 2021-11-16 DIAGNOSIS — S2241XD Multiple fractures of ribs, right side, subsequent encounter for fracture with routine healing: Secondary | ICD-10-CM | POA: Diagnosis not present

## 2021-11-16 DIAGNOSIS — I951 Orthostatic hypotension: Secondary | ICD-10-CM | POA: Diagnosis not present

## 2021-11-16 DIAGNOSIS — K529 Noninfective gastroenteritis and colitis, unspecified: Secondary | ICD-10-CM | POA: Diagnosis not present

## 2021-11-16 DIAGNOSIS — W010XXD Fall on same level from slipping, tripping and stumbling without subsequent striking against object, subsequent encounter: Secondary | ICD-10-CM | POA: Diagnosis not present

## 2021-11-24 DIAGNOSIS — R739 Hyperglycemia, unspecified: Secondary | ICD-10-CM | POA: Diagnosis not present

## 2021-11-24 DIAGNOSIS — M8580 Other specified disorders of bone density and structure, unspecified site: Secondary | ICD-10-CM | POA: Diagnosis not present

## 2021-11-24 DIAGNOSIS — N3941 Urge incontinence: Secondary | ICD-10-CM | POA: Diagnosis not present

## 2021-11-24 DIAGNOSIS — I10 Essential (primary) hypertension: Secondary | ICD-10-CM | POA: Diagnosis not present

## 2021-11-24 DIAGNOSIS — I951 Orthostatic hypotension: Secondary | ICD-10-CM | POA: Diagnosis not present

## 2021-11-24 DIAGNOSIS — Z9181 History of falling: Secondary | ICD-10-CM | POA: Diagnosis not present

## 2021-11-24 DIAGNOSIS — E785 Hyperlipidemia, unspecified: Secondary | ICD-10-CM | POA: Diagnosis not present

## 2021-11-24 DIAGNOSIS — Z7952 Long term (current) use of systemic steroids: Secondary | ICD-10-CM | POA: Diagnosis not present

## 2021-11-24 DIAGNOSIS — I700 Atherosclerosis of aorta: Secondary | ICD-10-CM | POA: Diagnosis not present

## 2021-11-24 DIAGNOSIS — K529 Noninfective gastroenteritis and colitis, unspecified: Secondary | ICD-10-CM | POA: Diagnosis not present

## 2021-11-24 DIAGNOSIS — R490 Dysphonia: Secondary | ICD-10-CM | POA: Diagnosis not present

## 2021-11-24 DIAGNOSIS — G500 Trigeminal neuralgia: Secondary | ICD-10-CM | POA: Diagnosis not present

## 2021-11-24 DIAGNOSIS — I2510 Atherosclerotic heart disease of native coronary artery without angina pectoris: Secondary | ICD-10-CM | POA: Diagnosis not present

## 2021-11-24 DIAGNOSIS — K219 Gastro-esophageal reflux disease without esophagitis: Secondary | ICD-10-CM | POA: Diagnosis not present

## 2021-11-24 DIAGNOSIS — Z792 Long term (current) use of antibiotics: Secondary | ICD-10-CM | POA: Diagnosis not present

## 2021-11-24 DIAGNOSIS — W010XXD Fall on same level from slipping, tripping and stumbling without subsequent striking against object, subsequent encounter: Secondary | ICD-10-CM | POA: Diagnosis not present

## 2021-11-24 DIAGNOSIS — I739 Peripheral vascular disease, unspecified: Secondary | ICD-10-CM | POA: Diagnosis not present

## 2021-11-24 DIAGNOSIS — S2241XD Multiple fractures of ribs, right side, subsequent encounter for fracture with routine healing: Secondary | ICD-10-CM | POA: Diagnosis not present

## 2021-11-24 DIAGNOSIS — J9611 Chronic respiratory failure with hypoxia: Secondary | ICD-10-CM | POA: Diagnosis not present

## 2021-11-24 DIAGNOSIS — J439 Emphysema, unspecified: Secondary | ICD-10-CM | POA: Diagnosis not present

## 2021-11-24 DIAGNOSIS — Z79890 Hormone replacement therapy (postmenopausal): Secondary | ICD-10-CM | POA: Diagnosis not present

## 2021-11-24 DIAGNOSIS — Z7983 Long term (current) use of bisphosphonates: Secondary | ICD-10-CM | POA: Diagnosis not present

## 2021-11-24 DIAGNOSIS — R1310 Dysphagia, unspecified: Secondary | ICD-10-CM | POA: Diagnosis not present

## 2021-11-24 DIAGNOSIS — Z7951 Long term (current) use of inhaled steroids: Secondary | ICD-10-CM | POA: Diagnosis not present

## 2021-12-01 DIAGNOSIS — S2241XD Multiple fractures of ribs, right side, subsequent encounter for fracture with routine healing: Secondary | ICD-10-CM | POA: Diagnosis not present

## 2021-12-01 DIAGNOSIS — E785 Hyperlipidemia, unspecified: Secondary | ICD-10-CM | POA: Diagnosis not present

## 2021-12-01 DIAGNOSIS — Z792 Long term (current) use of antibiotics: Secondary | ICD-10-CM | POA: Diagnosis not present

## 2021-12-01 DIAGNOSIS — N3941 Urge incontinence: Secondary | ICD-10-CM | POA: Diagnosis not present

## 2021-12-01 DIAGNOSIS — J9611 Chronic respiratory failure with hypoxia: Secondary | ICD-10-CM | POA: Diagnosis not present

## 2021-12-01 DIAGNOSIS — Z7952 Long term (current) use of systemic steroids: Secondary | ICD-10-CM | POA: Diagnosis not present

## 2021-12-01 DIAGNOSIS — I739 Peripheral vascular disease, unspecified: Secondary | ICD-10-CM | POA: Diagnosis not present

## 2021-12-01 DIAGNOSIS — Z9181 History of falling: Secondary | ICD-10-CM | POA: Diagnosis not present

## 2021-12-01 DIAGNOSIS — Z7951 Long term (current) use of inhaled steroids: Secondary | ICD-10-CM | POA: Diagnosis not present

## 2021-12-01 DIAGNOSIS — R739 Hyperglycemia, unspecified: Secondary | ICD-10-CM | POA: Diagnosis not present

## 2021-12-01 DIAGNOSIS — Z79890 Hormone replacement therapy (postmenopausal): Secondary | ICD-10-CM | POA: Diagnosis not present

## 2021-12-01 DIAGNOSIS — R490 Dysphonia: Secondary | ICD-10-CM | POA: Diagnosis not present

## 2021-12-01 DIAGNOSIS — G500 Trigeminal neuralgia: Secondary | ICD-10-CM | POA: Diagnosis not present

## 2021-12-01 DIAGNOSIS — M8580 Other specified disorders of bone density and structure, unspecified site: Secondary | ICD-10-CM | POA: Diagnosis not present

## 2021-12-01 DIAGNOSIS — I951 Orthostatic hypotension: Secondary | ICD-10-CM | POA: Diagnosis not present

## 2021-12-01 DIAGNOSIS — Z7983 Long term (current) use of bisphosphonates: Secondary | ICD-10-CM | POA: Diagnosis not present

## 2021-12-01 DIAGNOSIS — I10 Essential (primary) hypertension: Secondary | ICD-10-CM | POA: Diagnosis not present

## 2021-12-01 DIAGNOSIS — I700 Atherosclerosis of aorta: Secondary | ICD-10-CM | POA: Diagnosis not present

## 2021-12-01 DIAGNOSIS — J439 Emphysema, unspecified: Secondary | ICD-10-CM | POA: Diagnosis not present

## 2021-12-01 DIAGNOSIS — K219 Gastro-esophageal reflux disease without esophagitis: Secondary | ICD-10-CM | POA: Diagnosis not present

## 2021-12-01 DIAGNOSIS — W010XXD Fall on same level from slipping, tripping and stumbling without subsequent striking against object, subsequent encounter: Secondary | ICD-10-CM | POA: Diagnosis not present

## 2021-12-01 DIAGNOSIS — R1310 Dysphagia, unspecified: Secondary | ICD-10-CM | POA: Diagnosis not present

## 2021-12-01 DIAGNOSIS — I2510 Atherosclerotic heart disease of native coronary artery without angina pectoris: Secondary | ICD-10-CM | POA: Diagnosis not present

## 2021-12-01 DIAGNOSIS — K529 Noninfective gastroenteritis and colitis, unspecified: Secondary | ICD-10-CM | POA: Diagnosis not present

## 2021-12-05 ENCOUNTER — Other Ambulatory Visit: Payer: Self-pay | Admitting: Internal Medicine

## 2021-12-05 DIAGNOSIS — J449 Chronic obstructive pulmonary disease, unspecified: Secondary | ICD-10-CM

## 2021-12-05 DIAGNOSIS — J9611 Chronic respiratory failure with hypoxia: Secondary | ICD-10-CM

## 2021-12-05 DIAGNOSIS — R0689 Other abnormalities of breathing: Secondary | ICD-10-CM

## 2021-12-07 ENCOUNTER — Telehealth: Payer: Self-pay

## 2021-12-07 NOTE — Progress Notes (Signed)
Chronic Care Management Pharmacy Assistant   Name: YTZEL GUBLER  MRN: [id number] DOB: 1941-03-30  Reason for Encounter: CCM (Hypertension Disease State)    Recent office visits:  09/09/21 Ria Bush, MD Injury due to fall Referral: Home Health PT Start: predniSONE (DELTASONE) 10 MG tablet Start: predniSONE (DELTASONE) 20 MG tablet Stop (completed): oxyCODONE-acetaminophen (PERCOCET) 5-325 MG tablet Stop (completed): sulfamethoxazole-trimethoprim (BACTRIM DS) 800-160 MG tablet 08/05/21 Ria Bush, MD Annual Exam Abnormal Labs: "Your urine test returned suspicious for infection, urine culture is pending - I have sent in ciprofloxacin antibiotic for you to start taking. Continue drinking plenty of water."  Start: Ciprofloxacin 250 mg for UTI  Recent consult visits:  10/13/21 COPD Ordered: DG Chest Change: predniSONE (DELTASONE) 10 MG tablet Start: Mucinex BID PRN Stop (patient): cholecalciferol (VITAMIN D) 1000 units tablet Stop (completed): hydrochlorothiazide (MICROZIDE) 12.5 MG capsule FU 6 months 07/23/21 Samara Deist, DPM Paronychia of toe of right foot  07/23/21 Samara Deist, DPM Ingrown Toenail Procedure: permanent removal of the medial nail border right great toenail   Hospital visits:  08/18/21 [facility] ED - Discharged same day Present to ED for fall Final Diagnoses: Closed Fracture of one rib of right side and Contusion of left knee Start Oxycodone-Acetaminophen 5-325 mg  Medications: Outpatient Encounter Medications as of 12/07/2021  Medication Sig   albuterol (PROVENTIL) (2.5 MG/3ML) 0.083% nebulizer solution USE ONE VIAL IN NEBULIZER DAILY AS NEEDED FOR RESCUE   albuterol (VENTOLIN HFA) 108 (90 Base) MCG/ACT inhaler INHALE 2 PUFFS EVERY 4 HOURS AS NEEDED   alendronate (FOSAMAX) 70 MG tablet Take 1 tablet (70 mg total) by mouth once a week. Take with a full glass of water on an empty stomach.   arformoterol (BROVANA) 15 MCG/2ML NEBU USE 1 VIAL IN  NEBULIZER TWICE DAILY (MORNING AND EVENING)   budesonide (PULMICORT) 0.5 MG/2ML nebulizer solution INHALE 1 VIAL VIA NEBULIZER TWICE DAILY   buPROPion (WELLBUTRIN SR) 150 MG 12 hr tablet TAKE ONE TABLET BY MOUTH TWICE DAILY   conjugated estrogens (PREMARIN) vaginal cream Place 1 Applicatorful vaginally daily. Apply 0.5mg  (pea-sized amount)  just inside the vaginal introitus with a finger-tip on  Monday, Wednesday and Friday nights. (Patient taking differently: Place 1 Applicatorful vaginally as needed. Apply 0.5mg  (pea-sized amount)  just inside the vaginal introitus with a finger-tip on  Monday, Wednesday and Friday nights.)   CRANBERRY PO Take 2 tablets by mouth every morning.    ezetimibe (ZETIA) 10 MG tablet TAKE 1 TABLET BY MOUTH DAILY   losartan (COZAAR) 100 MG tablet TAKE 1 TABLET BY MOUTH DAILY   metoprolol tartrate (LOPRESSOR) 25 MG tablet TAKE ONE TABLET BY MOUTH TWICE DAILY   mirtazapine (REMERON) 15 MG tablet TAKE 1 TABLET BY MOUTH AT BEDTIME   Multiple Vitamins-Minerals (PRESERVISION AREDS 2) CAPS Take 2 capsules by mouth daily.   pravastatin (PRAVACHOL) 80 MG tablet TAKE ONE TABLET BY MOUTH AT BEDTIME   predniSONE (DELTASONE) 10 MG tablet TAKE 1 TABLET BY MOUTH DAILY WITH BREAKFAST   tiotropium (SPIRIVA) 18 MCG inhalation capsule Place 1 capsule (18 mcg total) into inhaler and inhale daily.   No facility-administered encounter medications on file as of 12/07/2021.    Recent Office Vitals: BP Readings from Last 3 Encounters:  10/13/21 128/80  09/09/21 124/72  08/18/21 134/76   Pulse Readings from Last 3 Encounters:  10/13/21 68  09/09/21 69  08/18/21 74    Wt Readings from Last 3 Encounters:  10/13/21 134 lb 3.2 oz (60.9  kg)  09/09/21 135 lb (61.2 kg)  08/18/21 125 lb (56.7 kg)     Kidney Function Lab Results  Component Value Date/Time   CREATININE 0.91 07/29/2021 10:46 AM   CREATININE 0.91 04/28/2021 04:12 PM   CREATININE 0.71 07/24/2012 01:13 PM   GFR 59.65 (L)  07/29/2021 10:46 AM   GFRNONAA >60 01/24/2020 06:05 PM   GFRNONAA >60 07/24/2012 01:13 PM   GFRAA >60 01/24/2020 06:05 PM   GFRAA >60 07/24/2012 01:13 PM       Latest Ref Rng & Units 07/29/2021   10:46 AM 04/28/2021    4:12 PM 06/11/2020   11:30 AM  BMP  Glucose 70 - 99 mg/dL 95  112  102   BUN 6 - 23 mg/dL 22  17  9    Creatinine 0.40 - 1.20 mg/dL 0.91  0.91  0.81   Sodium 135 - 145 mEq/L 140  139  141   Potassium 3.5 - 5.1 mEq/L 4.2  4.7  4.5   Chloride 96 - 112 mEq/L 95  96  98   CO2 19 - 32 mEq/L 39  38  40   Calcium 8.4 - 10.5 mg/dL 9.4  9.6  9.4     Contacted patient on 12/07/21 to discuss hypertension disease state  Current antihypertensive regimen:  Metoprolol 25 mg 1 tablet BID  Losartan 100 mg 1 tablet daily  Patient verbally confirms she is taking the above medications as directed. Yes  How often are you checking your Blood Pressure? infrequently Due to patient being in pain it is difficult for her to sit up and she has not been taking her blood pressure. I advised patient I would call her back in two weeks to see if she is better enough to start a log for her blood pressure.   Patient stated she fell last Wednesday (12/02/21) while in the kitchen making dinner. Patient turned and just fell. Patient did not go anywhere to be checked out. She hurt her left side; states the bottom portion of her left ribs. Patient had some left over Oxycodone-Acetaminophen 5-325 mg, but has been trying no to take it. Patient is wondering if it is ok to take Aleve instead. Patient is also wondering which medication she takes that makes her bruise so easily.   Wrist or arm cuff: Wrist Caffeine intake: Patient drinks 5 cups of coffee daily Salt intake: Patient watches her salt intake Over the counter medications including pseudoephedrine or NSAIDs? Mucinex as needed.   Any readings above 180/120? No  What recent interventions/DTPs have been made by any provider to improve Blood Pressure  control since last CPP Visit: No recent interventions  Any recent hospitalizations or ED visits since last visit with CPP? No  What diet changes have been made to improve Blood Pressure Control?  Patient watches her salt intake  What exercise is being done to improve your Blood Pressure Control?  Patient does not exercise due to COPD.   Adherence Review: Is the patient currently on ACE/ARB medication? Yes Does the patient have >5 day gap between last estimated fill dates? No  Star Rating Drugs:  Medication:  Last Fill: Day Supply Losartan 100 mg 10/21/21 90 Pravastatin 80 mg 10/01/21 90   Care Gaps: Annual wellness visit in last year? Yes 06/11/21 Most Recent BP reading: 12/80 on 10/13/21  Upcoming appointments: CCM appointment on 06/07/21  [name], CPP notified  [name], [place] Assistant [phone]

## 2021-12-07 NOTE — Progress Notes (Signed)
Per [name], I called patient to inform her she can take Aleve if only for a few days, but Tylenol is preferred. I also informed her that Prednisone is probably making her bruise.  Patient expressed understanding.   [name], CPP notified  [name], [place] Clinical Pharmacy Assistant [phone]

## 2021-12-10 DIAGNOSIS — K529 Noninfective gastroenteritis and colitis, unspecified: Secondary | ICD-10-CM | POA: Diagnosis not present

## 2021-12-10 DIAGNOSIS — I739 Peripheral vascular disease, unspecified: Secondary | ICD-10-CM | POA: Diagnosis not present

## 2021-12-10 DIAGNOSIS — Z7951 Long term (current) use of inhaled steroids: Secondary | ICD-10-CM | POA: Diagnosis not present

## 2021-12-10 DIAGNOSIS — I700 Atherosclerosis of aorta: Secondary | ICD-10-CM | POA: Diagnosis not present

## 2021-12-10 DIAGNOSIS — J9611 Chronic respiratory failure with hypoxia: Secondary | ICD-10-CM | POA: Diagnosis not present

## 2021-12-10 DIAGNOSIS — K219 Gastro-esophageal reflux disease without esophagitis: Secondary | ICD-10-CM | POA: Diagnosis not present

## 2021-12-10 DIAGNOSIS — I10 Essential (primary) hypertension: Secondary | ICD-10-CM | POA: Diagnosis not present

## 2021-12-10 DIAGNOSIS — Z7983 Long term (current) use of bisphosphonates: Secondary | ICD-10-CM | POA: Diagnosis not present

## 2021-12-10 DIAGNOSIS — Z7952 Long term (current) use of systemic steroids: Secondary | ICD-10-CM | POA: Diagnosis not present

## 2021-12-10 DIAGNOSIS — Z792 Long term (current) use of antibiotics: Secondary | ICD-10-CM | POA: Diagnosis not present

## 2021-12-10 DIAGNOSIS — R1310 Dysphagia, unspecified: Secondary | ICD-10-CM | POA: Diagnosis not present

## 2021-12-10 DIAGNOSIS — W010XXD Fall on same level from slipping, tripping and stumbling without subsequent striking against object, subsequent encounter: Secondary | ICD-10-CM | POA: Diagnosis not present

## 2021-12-10 DIAGNOSIS — J439 Emphysema, unspecified: Secondary | ICD-10-CM | POA: Diagnosis not present

## 2021-12-10 DIAGNOSIS — G500 Trigeminal neuralgia: Secondary | ICD-10-CM | POA: Diagnosis not present

## 2021-12-10 DIAGNOSIS — R739 Hyperglycemia, unspecified: Secondary | ICD-10-CM | POA: Diagnosis not present

## 2021-12-10 DIAGNOSIS — Z9181 History of falling: Secondary | ICD-10-CM | POA: Diagnosis not present

## 2021-12-10 DIAGNOSIS — N3941 Urge incontinence: Secondary | ICD-10-CM | POA: Diagnosis not present

## 2021-12-10 DIAGNOSIS — M8580 Other specified disorders of bone density and structure, unspecified site: Secondary | ICD-10-CM | POA: Diagnosis not present

## 2021-12-10 DIAGNOSIS — E785 Hyperlipidemia, unspecified: Secondary | ICD-10-CM | POA: Diagnosis not present

## 2021-12-10 DIAGNOSIS — R490 Dysphonia: Secondary | ICD-10-CM | POA: Diagnosis not present

## 2021-12-10 DIAGNOSIS — Z79890 Hormone replacement therapy (postmenopausal): Secondary | ICD-10-CM | POA: Diagnosis not present

## 2021-12-10 DIAGNOSIS — I951 Orthostatic hypotension: Secondary | ICD-10-CM | POA: Diagnosis not present

## 2021-12-10 DIAGNOSIS — I2510 Atherosclerotic heart disease of native coronary artery without angina pectoris: Secondary | ICD-10-CM | POA: Diagnosis not present

## 2021-12-10 DIAGNOSIS — S2241XD Multiple fractures of ribs, right side, subsequent encounter for fracture with routine healing: Secondary | ICD-10-CM | POA: Diagnosis not present

## 2021-12-11 ENCOUNTER — Telehealth: Payer: Self-pay

## 2021-12-11 NOTE — Telephone Encounter (Signed)
Agree with this. Thanks.  

## 2021-12-11 NOTE — Telephone Encounter (Signed)
Home Health verbal orders  Agency Name: [facility]   Requesting PT  Reason:Extend PT  Frequency: 1W9  Comments: Patient fell on 7/11, no injuries but has soreness in back.   Please forward to [facility] pool or providers CMA

## 2021-12-14 NOTE — Telephone Encounter (Signed)
Lvm asking Jatana (of [facility]) to call back.  Need to inform her Dr. Darnell Level is giving verbal orders for services requested.

## 2021-12-15 DIAGNOSIS — R1310 Dysphagia, unspecified: Secondary | ICD-10-CM | POA: Diagnosis not present

## 2021-12-15 DIAGNOSIS — G500 Trigeminal neuralgia: Secondary | ICD-10-CM | POA: Diagnosis not present

## 2021-12-15 DIAGNOSIS — J9611 Chronic respiratory failure with hypoxia: Secondary | ICD-10-CM | POA: Diagnosis not present

## 2021-12-15 DIAGNOSIS — Z9181 History of falling: Secondary | ICD-10-CM | POA: Diagnosis not present

## 2021-12-15 DIAGNOSIS — I2510 Atherosclerotic heart disease of native coronary artery without angina pectoris: Secondary | ICD-10-CM | POA: Diagnosis not present

## 2021-12-15 DIAGNOSIS — I700 Atherosclerosis of aorta: Secondary | ICD-10-CM | POA: Diagnosis not present

## 2021-12-15 DIAGNOSIS — R739 Hyperglycemia, unspecified: Secondary | ICD-10-CM | POA: Diagnosis not present

## 2021-12-15 DIAGNOSIS — K529 Noninfective gastroenteritis and colitis, unspecified: Secondary | ICD-10-CM | POA: Diagnosis not present

## 2021-12-15 DIAGNOSIS — Z7951 Long term (current) use of inhaled steroids: Secondary | ICD-10-CM | POA: Diagnosis not present

## 2021-12-15 DIAGNOSIS — Z79890 Hormone replacement therapy (postmenopausal): Secondary | ICD-10-CM | POA: Diagnosis not present

## 2021-12-15 DIAGNOSIS — Z9981 Dependence on supplemental oxygen: Secondary | ICD-10-CM | POA: Diagnosis not present

## 2021-12-15 DIAGNOSIS — Z87891 Personal history of nicotine dependence: Secondary | ICD-10-CM | POA: Diagnosis not present

## 2021-12-15 DIAGNOSIS — N3941 Urge incontinence: Secondary | ICD-10-CM | POA: Diagnosis not present

## 2021-12-15 DIAGNOSIS — Z7983 Long term (current) use of bisphosphonates: Secondary | ICD-10-CM | POA: Diagnosis not present

## 2021-12-15 DIAGNOSIS — I951 Orthostatic hypotension: Secondary | ICD-10-CM | POA: Diagnosis not present

## 2021-12-15 DIAGNOSIS — M8580 Other specified disorders of bone density and structure, unspecified site: Secondary | ICD-10-CM | POA: Diagnosis not present

## 2021-12-15 DIAGNOSIS — I739 Peripheral vascular disease, unspecified: Secondary | ICD-10-CM | POA: Diagnosis not present

## 2021-12-15 DIAGNOSIS — K219 Gastro-esophageal reflux disease without esophagitis: Secondary | ICD-10-CM | POA: Diagnosis not present

## 2021-12-15 DIAGNOSIS — R490 Dysphonia: Secondary | ICD-10-CM | POA: Diagnosis not present

## 2021-12-15 DIAGNOSIS — Z792 Long term (current) use of antibiotics: Secondary | ICD-10-CM | POA: Diagnosis not present

## 2021-12-15 DIAGNOSIS — I10 Essential (primary) hypertension: Secondary | ICD-10-CM | POA: Diagnosis not present

## 2021-12-15 DIAGNOSIS — E785 Hyperlipidemia, unspecified: Secondary | ICD-10-CM | POA: Diagnosis not present

## 2021-12-15 DIAGNOSIS — J439 Emphysema, unspecified: Secondary | ICD-10-CM | POA: Diagnosis not present

## 2021-12-15 DIAGNOSIS — Z7952 Long term (current) use of systemic steroids: Secondary | ICD-10-CM | POA: Diagnosis not present

## 2021-12-17 ENCOUNTER — Other Ambulatory Visit: Payer: Self-pay | Admitting: Internal Medicine

## 2021-12-22 DIAGNOSIS — E785 Hyperlipidemia, unspecified: Secondary | ICD-10-CM | POA: Diagnosis not present

## 2021-12-22 DIAGNOSIS — Z7951 Long term (current) use of inhaled steroids: Secondary | ICD-10-CM | POA: Diagnosis not present

## 2021-12-22 DIAGNOSIS — I951 Orthostatic hypotension: Secondary | ICD-10-CM | POA: Diagnosis not present

## 2021-12-22 DIAGNOSIS — I10 Essential (primary) hypertension: Secondary | ICD-10-CM | POA: Diagnosis not present

## 2021-12-22 DIAGNOSIS — R739 Hyperglycemia, unspecified: Secondary | ICD-10-CM | POA: Diagnosis not present

## 2021-12-22 DIAGNOSIS — Z79890 Hormone replacement therapy (postmenopausal): Secondary | ICD-10-CM | POA: Diagnosis not present

## 2021-12-22 DIAGNOSIS — Z792 Long term (current) use of antibiotics: Secondary | ICD-10-CM | POA: Diagnosis not present

## 2021-12-22 DIAGNOSIS — K529 Noninfective gastroenteritis and colitis, unspecified: Secondary | ICD-10-CM | POA: Diagnosis not present

## 2021-12-22 DIAGNOSIS — J439 Emphysema, unspecified: Secondary | ICD-10-CM | POA: Diagnosis not present

## 2021-12-22 DIAGNOSIS — R1310 Dysphagia, unspecified: Secondary | ICD-10-CM | POA: Diagnosis not present

## 2021-12-22 DIAGNOSIS — I2510 Atherosclerotic heart disease of native coronary artery without angina pectoris: Secondary | ICD-10-CM | POA: Diagnosis not present

## 2021-12-22 DIAGNOSIS — Z7952 Long term (current) use of systemic steroids: Secondary | ICD-10-CM | POA: Diagnosis not present

## 2021-12-22 DIAGNOSIS — G500 Trigeminal neuralgia: Secondary | ICD-10-CM | POA: Diagnosis not present

## 2021-12-22 DIAGNOSIS — Z87891 Personal history of nicotine dependence: Secondary | ICD-10-CM | POA: Diagnosis not present

## 2021-12-22 DIAGNOSIS — Z9981 Dependence on supplemental oxygen: Secondary | ICD-10-CM | POA: Diagnosis not present

## 2021-12-22 DIAGNOSIS — M8580 Other specified disorders of bone density and structure, unspecified site: Secondary | ICD-10-CM | POA: Diagnosis not present

## 2021-12-22 DIAGNOSIS — N3941 Urge incontinence: Secondary | ICD-10-CM | POA: Diagnosis not present

## 2021-12-22 DIAGNOSIS — Z7983 Long term (current) use of bisphosphonates: Secondary | ICD-10-CM | POA: Diagnosis not present

## 2021-12-22 DIAGNOSIS — R490 Dysphonia: Secondary | ICD-10-CM | POA: Diagnosis not present

## 2021-12-22 DIAGNOSIS — J9611 Chronic respiratory failure with hypoxia: Secondary | ICD-10-CM | POA: Diagnosis not present

## 2021-12-22 DIAGNOSIS — I700 Atherosclerosis of aorta: Secondary | ICD-10-CM | POA: Diagnosis not present

## 2021-12-22 DIAGNOSIS — K219 Gastro-esophageal reflux disease without esophagitis: Secondary | ICD-10-CM | POA: Diagnosis not present

## 2021-12-22 DIAGNOSIS — I739 Peripheral vascular disease, unspecified: Secondary | ICD-10-CM | POA: Diagnosis not present

## 2021-12-22 DIAGNOSIS — Z9181 History of falling: Secondary | ICD-10-CM | POA: Diagnosis not present

## 2021-12-23 ENCOUNTER — Telehealth: Payer: Self-pay | Admitting: Family Medicine

## 2021-12-23 NOTE — Telephone Encounter (Signed)
Spoke with Tiffany to inform her of Dr. Darnell Level giving verbal orders she requested.  However, she states someone had already called her about this.  Offered my apologies for the 2nd call.

## 2021-12-23 NOTE — Telephone Encounter (Signed)
Agree with this. Thanks.  

## 2021-12-23 NOTE — Telephone Encounter (Signed)
Home Health verbal orders [place] Name: [name] [name]  Callback number: [phone]  Requesting OT/PT/Skilled nursing/Social Work/Speech:Socialworker  Reason: For community resources  Frequency:  Please forward to [facility] pool or providers CMA

## 2021-12-29 ENCOUNTER — Telehealth: Payer: Self-pay | Admitting: Adult Health

## 2021-12-29 NOTE — Telephone Encounter (Addendum)
Spoke to patient.  She stated that insurance will no longer cover arformoterol. Covered alternative are Serevent or Formoterol.  Tammy Parrett, please advise. Thanks

## 2021-12-29 NOTE — Telephone Encounter (Signed)
This is [name] [name] patient . She is on Nebs, those are inhalers  Will her insurance not cover Perforomist Neb Twice daily  that is the alternative to [place] .   Are we sending to DME /MCR B , not pharmacy  would that help . ?

## 2021-12-30 NOTE — Telephone Encounter (Signed)
Pharmacy team, can you guys assist with this?

## 2021-12-31 ENCOUNTER — Other Ambulatory Visit (HOSPITAL_COMMUNITY): Payer: Self-pay

## 2021-12-31 MED ORDER — FORMOTEROL FUMARATE 20 MCG/2ML IN NEBU: 20.0000 ug | INHALATION_SOLUTION | Freq: Two times a day (BID) | RESPIRATORY_TRACT | 11 refills | Status: DC

## 2021-12-31 NOTE — Telephone Encounter (Signed)
Do we know if they are filing MCR B , may have to call pharmacy

## 2021-12-31 NOTE — Telephone Encounter (Signed)
Spoke to patient and relayed below message.  She voiced her understanding and stated that she would like to try Perforomist. Rx sent to preferred pharmacy. Nothing further needed.   Routing to TP as an [place].

## 2022-01-04 DIAGNOSIS — Z9181 History of falling: Secondary | ICD-10-CM | POA: Diagnosis not present

## 2022-01-04 DIAGNOSIS — Z79890 Hormone replacement therapy (postmenopausal): Secondary | ICD-10-CM | POA: Diagnosis not present

## 2022-01-04 DIAGNOSIS — J9611 Chronic respiratory failure with hypoxia: Secondary | ICD-10-CM | POA: Diagnosis not present

## 2022-01-04 DIAGNOSIS — M8580 Other specified disorders of bone density and structure, unspecified site: Secondary | ICD-10-CM | POA: Diagnosis not present

## 2022-01-04 DIAGNOSIS — N3941 Urge incontinence: Secondary | ICD-10-CM | POA: Diagnosis not present

## 2022-01-04 DIAGNOSIS — Z87891 Personal history of nicotine dependence: Secondary | ICD-10-CM | POA: Diagnosis not present

## 2022-01-04 DIAGNOSIS — R1310 Dysphagia, unspecified: Secondary | ICD-10-CM | POA: Diagnosis not present

## 2022-01-04 DIAGNOSIS — K219 Gastro-esophageal reflux disease without esophagitis: Secondary | ICD-10-CM | POA: Diagnosis not present

## 2022-01-04 DIAGNOSIS — I10 Essential (primary) hypertension: Secondary | ICD-10-CM | POA: Diagnosis not present

## 2022-01-04 DIAGNOSIS — I739 Peripheral vascular disease, unspecified: Secondary | ICD-10-CM | POA: Diagnosis not present

## 2022-01-04 DIAGNOSIS — Z7952 Long term (current) use of systemic steroids: Secondary | ICD-10-CM | POA: Diagnosis not present

## 2022-01-04 DIAGNOSIS — Z792 Long term (current) use of antibiotics: Secondary | ICD-10-CM | POA: Diagnosis not present

## 2022-01-04 DIAGNOSIS — R490 Dysphonia: Secondary | ICD-10-CM | POA: Diagnosis not present

## 2022-01-04 DIAGNOSIS — R739 Hyperglycemia, unspecified: Secondary | ICD-10-CM | POA: Diagnosis not present

## 2022-01-04 DIAGNOSIS — K529 Noninfective gastroenteritis and colitis, unspecified: Secondary | ICD-10-CM | POA: Diagnosis not present

## 2022-01-04 DIAGNOSIS — Z9981 Dependence on supplemental oxygen: Secondary | ICD-10-CM | POA: Diagnosis not present

## 2022-01-04 DIAGNOSIS — J439 Emphysema, unspecified: Secondary | ICD-10-CM | POA: Diagnosis not present

## 2022-01-04 DIAGNOSIS — Z7983 Long term (current) use of bisphosphonates: Secondary | ICD-10-CM | POA: Diagnosis not present

## 2022-01-04 DIAGNOSIS — Z7951 Long term (current) use of inhaled steroids: Secondary | ICD-10-CM | POA: Diagnosis not present

## 2022-01-04 DIAGNOSIS — G500 Trigeminal neuralgia: Secondary | ICD-10-CM | POA: Diagnosis not present

## 2022-01-04 DIAGNOSIS — I951 Orthostatic hypotension: Secondary | ICD-10-CM | POA: Diagnosis not present

## 2022-01-04 DIAGNOSIS — I2510 Atherosclerotic heart disease of native coronary artery without angina pectoris: Secondary | ICD-10-CM | POA: Diagnosis not present

## 2022-01-04 DIAGNOSIS — E785 Hyperlipidemia, unspecified: Secondary | ICD-10-CM | POA: Diagnosis not present

## 2022-01-04 DIAGNOSIS — I700 Atherosclerosis of aorta: Secondary | ICD-10-CM | POA: Diagnosis not present

## 2022-01-09 DIAGNOSIS — R1310 Dysphagia, unspecified: Secondary | ICD-10-CM

## 2022-01-09 DIAGNOSIS — I700 Atherosclerosis of aorta: Secondary | ICD-10-CM | POA: Diagnosis not present

## 2022-01-09 DIAGNOSIS — Z87440 Personal history of urinary (tract) infections: Secondary | ICD-10-CM

## 2022-01-09 DIAGNOSIS — Z7983 Long term (current) use of bisphosphonates: Secondary | ICD-10-CM

## 2022-01-09 DIAGNOSIS — M8580 Other specified disorders of bone density and structure, unspecified site: Secondary | ICD-10-CM | POA: Diagnosis not present

## 2022-01-09 DIAGNOSIS — J439 Emphysema, unspecified: Secondary | ICD-10-CM | POA: Diagnosis not present

## 2022-01-09 DIAGNOSIS — Z9181 History of falling: Secondary | ICD-10-CM

## 2022-01-09 DIAGNOSIS — J9611 Chronic respiratory failure with hypoxia: Secondary | ICD-10-CM | POA: Diagnosis not present

## 2022-01-09 DIAGNOSIS — Z7951 Long term (current) use of inhaled steroids: Secondary | ICD-10-CM

## 2022-01-09 DIAGNOSIS — Z86010 Personal history of colonic polyps: Secondary | ICD-10-CM

## 2022-01-09 DIAGNOSIS — I739 Peripheral vascular disease, unspecified: Secondary | ICD-10-CM | POA: Diagnosis not present

## 2022-01-09 DIAGNOSIS — Z7952 Long term (current) use of systemic steroids: Secondary | ICD-10-CM

## 2022-01-09 DIAGNOSIS — Z9981 Dependence on supplemental oxygen: Secondary | ICD-10-CM

## 2022-01-09 DIAGNOSIS — G500 Trigeminal neuralgia: Secondary | ICD-10-CM | POA: Diagnosis not present

## 2022-01-09 DIAGNOSIS — N3941 Urge incontinence: Secondary | ICD-10-CM

## 2022-01-09 DIAGNOSIS — Z8701 Personal history of pneumonia (recurrent): Secondary | ICD-10-CM

## 2022-01-09 DIAGNOSIS — I2510 Atherosclerotic heart disease of native coronary artery without angina pectoris: Secondary | ICD-10-CM | POA: Diagnosis not present

## 2022-01-09 DIAGNOSIS — Z792 Long term (current) use of antibiotics: Secondary | ICD-10-CM

## 2022-01-09 DIAGNOSIS — Z79890 Hormone replacement therapy (postmenopausal): Secondary | ICD-10-CM

## 2022-01-09 DIAGNOSIS — R739 Hyperglycemia, unspecified: Secondary | ICD-10-CM

## 2022-01-09 DIAGNOSIS — E785 Hyperlipidemia, unspecified: Secondary | ICD-10-CM | POA: Diagnosis not present

## 2022-01-09 DIAGNOSIS — K219 Gastro-esophageal reflux disease without esophagitis: Secondary | ICD-10-CM | POA: Diagnosis not present

## 2022-01-09 DIAGNOSIS — I10 Essential (primary) hypertension: Secondary | ICD-10-CM | POA: Diagnosis not present

## 2022-01-09 DIAGNOSIS — Z87891 Personal history of nicotine dependence: Secondary | ICD-10-CM

## 2022-01-09 DIAGNOSIS — K529 Noninfective gastroenteritis and colitis, unspecified: Secondary | ICD-10-CM | POA: Diagnosis not present

## 2022-01-09 DIAGNOSIS — R490 Dysphonia: Secondary | ICD-10-CM

## 2022-01-09 DIAGNOSIS — I951 Orthostatic hypotension: Secondary | ICD-10-CM | POA: Diagnosis not present

## 2022-01-12 ENCOUNTER — Other Ambulatory Visit: Payer: Self-pay | Admitting: Family Medicine

## 2022-01-12 NOTE — Telephone Encounter (Signed)
Patient scheduled.

## 2022-01-12 NOTE — Telephone Encounter (Signed)
Noted  

## 2022-01-12 NOTE — Telephone Encounter (Signed)
E-scribed refill.  Plz schedule 6 mo f/u OV (on or after 02/05/22), per 08/05/21 OV notes.

## 2022-01-13 ENCOUNTER — Other Ambulatory Visit: Payer: Self-pay | Admitting: Family Medicine

## 2022-01-14 DIAGNOSIS — M8580 Other specified disorders of bone density and structure, unspecified site: Secondary | ICD-10-CM | POA: Diagnosis not present

## 2022-01-14 DIAGNOSIS — Z9981 Dependence on supplemental oxygen: Secondary | ICD-10-CM | POA: Diagnosis not present

## 2022-01-14 DIAGNOSIS — J439 Emphysema, unspecified: Secondary | ICD-10-CM | POA: Diagnosis not present

## 2022-01-14 DIAGNOSIS — I739 Peripheral vascular disease, unspecified: Secondary | ICD-10-CM | POA: Diagnosis not present

## 2022-01-14 DIAGNOSIS — E785 Hyperlipidemia, unspecified: Secondary | ICD-10-CM | POA: Diagnosis not present

## 2022-01-14 DIAGNOSIS — Z7983 Long term (current) use of bisphosphonates: Secondary | ICD-10-CM | POA: Diagnosis not present

## 2022-01-14 DIAGNOSIS — R490 Dysphonia: Secondary | ICD-10-CM | POA: Diagnosis not present

## 2022-01-14 DIAGNOSIS — K219 Gastro-esophageal reflux disease without esophagitis: Secondary | ICD-10-CM | POA: Diagnosis not present

## 2022-01-14 DIAGNOSIS — Z9181 History of falling: Secondary | ICD-10-CM | POA: Diagnosis not present

## 2022-01-14 DIAGNOSIS — R1310 Dysphagia, unspecified: Secondary | ICD-10-CM | POA: Diagnosis not present

## 2022-01-14 DIAGNOSIS — J9611 Chronic respiratory failure with hypoxia: Secondary | ICD-10-CM | POA: Diagnosis not present

## 2022-01-14 DIAGNOSIS — Z7951 Long term (current) use of inhaled steroids: Secondary | ICD-10-CM | POA: Diagnosis not present

## 2022-01-14 DIAGNOSIS — Z79890 Hormone replacement therapy (postmenopausal): Secondary | ICD-10-CM | POA: Diagnosis not present

## 2022-01-14 DIAGNOSIS — G500 Trigeminal neuralgia: Secondary | ICD-10-CM | POA: Diagnosis not present

## 2022-01-14 DIAGNOSIS — K529 Noninfective gastroenteritis and colitis, unspecified: Secondary | ICD-10-CM | POA: Diagnosis not present

## 2022-01-14 DIAGNOSIS — Z7952 Long term (current) use of systemic steroids: Secondary | ICD-10-CM | POA: Diagnosis not present

## 2022-01-14 DIAGNOSIS — N3941 Urge incontinence: Secondary | ICD-10-CM | POA: Diagnosis not present

## 2022-01-14 DIAGNOSIS — Z792 Long term (current) use of antibiotics: Secondary | ICD-10-CM | POA: Diagnosis not present

## 2022-01-14 DIAGNOSIS — I951 Orthostatic hypotension: Secondary | ICD-10-CM | POA: Diagnosis not present

## 2022-01-14 DIAGNOSIS — I10 Essential (primary) hypertension: Secondary | ICD-10-CM | POA: Diagnosis not present

## 2022-01-14 DIAGNOSIS — R739 Hyperglycemia, unspecified: Secondary | ICD-10-CM | POA: Diagnosis not present

## 2022-01-14 DIAGNOSIS — I2510 Atherosclerotic heart disease of native coronary artery without angina pectoris: Secondary | ICD-10-CM | POA: Diagnosis not present

## 2022-01-14 DIAGNOSIS — Z87891 Personal history of nicotine dependence: Secondary | ICD-10-CM | POA: Diagnosis not present

## 2022-01-14 DIAGNOSIS — I700 Atherosclerosis of aorta: Secondary | ICD-10-CM | POA: Diagnosis not present

## 2022-01-19 DIAGNOSIS — Z9181 History of falling: Secondary | ICD-10-CM | POA: Diagnosis not present

## 2022-01-19 DIAGNOSIS — K529 Noninfective gastroenteritis and colitis, unspecified: Secondary | ICD-10-CM | POA: Diagnosis not present

## 2022-01-19 DIAGNOSIS — I10 Essential (primary) hypertension: Secondary | ICD-10-CM | POA: Diagnosis not present

## 2022-01-19 DIAGNOSIS — Z7983 Long term (current) use of bisphosphonates: Secondary | ICD-10-CM | POA: Diagnosis not present

## 2022-01-19 DIAGNOSIS — N3941 Urge incontinence: Secondary | ICD-10-CM | POA: Diagnosis not present

## 2022-01-19 DIAGNOSIS — Z7951 Long term (current) use of inhaled steroids: Secondary | ICD-10-CM | POA: Diagnosis not present

## 2022-01-19 DIAGNOSIS — Z9981 Dependence on supplemental oxygen: Secondary | ICD-10-CM | POA: Diagnosis not present

## 2022-01-19 DIAGNOSIS — I2510 Atherosclerotic heart disease of native coronary artery without angina pectoris: Secondary | ICD-10-CM | POA: Diagnosis not present

## 2022-01-19 DIAGNOSIS — I739 Peripheral vascular disease, unspecified: Secondary | ICD-10-CM | POA: Diagnosis not present

## 2022-01-19 DIAGNOSIS — Z7952 Long term (current) use of systemic steroids: Secondary | ICD-10-CM | POA: Diagnosis not present

## 2022-01-19 DIAGNOSIS — J439 Emphysema, unspecified: Secondary | ICD-10-CM | POA: Diagnosis not present

## 2022-01-19 DIAGNOSIS — J9611 Chronic respiratory failure with hypoxia: Secondary | ICD-10-CM | POA: Diagnosis not present

## 2022-01-19 DIAGNOSIS — R490 Dysphonia: Secondary | ICD-10-CM | POA: Diagnosis not present

## 2022-01-19 DIAGNOSIS — R1310 Dysphagia, unspecified: Secondary | ICD-10-CM | POA: Diagnosis not present

## 2022-01-19 DIAGNOSIS — E785 Hyperlipidemia, unspecified: Secondary | ICD-10-CM | POA: Diagnosis not present

## 2022-01-19 DIAGNOSIS — Z792 Long term (current) use of antibiotics: Secondary | ICD-10-CM | POA: Diagnosis not present

## 2022-01-19 DIAGNOSIS — I951 Orthostatic hypotension: Secondary | ICD-10-CM | POA: Diagnosis not present

## 2022-01-19 DIAGNOSIS — K219 Gastro-esophageal reflux disease without esophagitis: Secondary | ICD-10-CM | POA: Diagnosis not present

## 2022-01-19 DIAGNOSIS — R739 Hyperglycemia, unspecified: Secondary | ICD-10-CM | POA: Diagnosis not present

## 2022-01-19 DIAGNOSIS — I700 Atherosclerosis of aorta: Secondary | ICD-10-CM | POA: Diagnosis not present

## 2022-01-19 DIAGNOSIS — M8580 Other specified disorders of bone density and structure, unspecified site: Secondary | ICD-10-CM | POA: Diagnosis not present

## 2022-01-19 DIAGNOSIS — G500 Trigeminal neuralgia: Secondary | ICD-10-CM | POA: Diagnosis not present

## 2022-01-19 DIAGNOSIS — Z79890 Hormone replacement therapy (postmenopausal): Secondary | ICD-10-CM | POA: Diagnosis not present

## 2022-01-19 DIAGNOSIS — Z87891 Personal history of nicotine dependence: Secondary | ICD-10-CM | POA: Diagnosis not present

## 2022-01-26 ENCOUNTER — Other Ambulatory Visit: Payer: Self-pay | Admitting: Internal Medicine

## 2022-01-26 DIAGNOSIS — R739 Hyperglycemia, unspecified: Secondary | ICD-10-CM | POA: Diagnosis not present

## 2022-01-26 DIAGNOSIS — J439 Emphysema, unspecified: Secondary | ICD-10-CM | POA: Diagnosis not present

## 2022-01-26 DIAGNOSIS — I739 Peripheral vascular disease, unspecified: Secondary | ICD-10-CM | POA: Diagnosis not present

## 2022-01-26 DIAGNOSIS — I2510 Atherosclerotic heart disease of native coronary artery without angina pectoris: Secondary | ICD-10-CM | POA: Diagnosis not present

## 2022-01-26 DIAGNOSIS — R1310 Dysphagia, unspecified: Secondary | ICD-10-CM | POA: Diagnosis not present

## 2022-01-26 DIAGNOSIS — I10 Essential (primary) hypertension: Secondary | ICD-10-CM | POA: Diagnosis not present

## 2022-01-26 DIAGNOSIS — Z7952 Long term (current) use of systemic steroids: Secondary | ICD-10-CM | POA: Diagnosis not present

## 2022-01-26 DIAGNOSIS — N3941 Urge incontinence: Secondary | ICD-10-CM | POA: Diagnosis not present

## 2022-01-26 DIAGNOSIS — I951 Orthostatic hypotension: Secondary | ICD-10-CM | POA: Diagnosis not present

## 2022-01-26 DIAGNOSIS — Z9181 History of falling: Secondary | ICD-10-CM | POA: Diagnosis not present

## 2022-01-26 DIAGNOSIS — Z7951 Long term (current) use of inhaled steroids: Secondary | ICD-10-CM | POA: Diagnosis not present

## 2022-01-26 DIAGNOSIS — I700 Atherosclerosis of aorta: Secondary | ICD-10-CM | POA: Diagnosis not present

## 2022-01-26 DIAGNOSIS — R490 Dysphonia: Secondary | ICD-10-CM | POA: Diagnosis not present

## 2022-01-26 DIAGNOSIS — K529 Noninfective gastroenteritis and colitis, unspecified: Secondary | ICD-10-CM | POA: Diagnosis not present

## 2022-01-26 DIAGNOSIS — Z7983 Long term (current) use of bisphosphonates: Secondary | ICD-10-CM | POA: Diagnosis not present

## 2022-01-26 DIAGNOSIS — E785 Hyperlipidemia, unspecified: Secondary | ICD-10-CM | POA: Diagnosis not present

## 2022-01-26 DIAGNOSIS — Z87891 Personal history of nicotine dependence: Secondary | ICD-10-CM | POA: Diagnosis not present

## 2022-01-26 DIAGNOSIS — Z79890 Hormone replacement therapy (postmenopausal): Secondary | ICD-10-CM | POA: Diagnosis not present

## 2022-01-26 DIAGNOSIS — M8580 Other specified disorders of bone density and structure, unspecified site: Secondary | ICD-10-CM | POA: Diagnosis not present

## 2022-01-26 DIAGNOSIS — J9611 Chronic respiratory failure with hypoxia: Secondary | ICD-10-CM | POA: Diagnosis not present

## 2022-01-26 DIAGNOSIS — K219 Gastro-esophageal reflux disease without esophagitis: Secondary | ICD-10-CM | POA: Diagnosis not present

## 2022-01-26 DIAGNOSIS — Z792 Long term (current) use of antibiotics: Secondary | ICD-10-CM | POA: Diagnosis not present

## 2022-01-26 DIAGNOSIS — Z9981 Dependence on supplemental oxygen: Secondary | ICD-10-CM | POA: Diagnosis not present

## 2022-01-26 DIAGNOSIS — G500 Trigeminal neuralgia: Secondary | ICD-10-CM | POA: Diagnosis not present

## 2022-01-29 DIAGNOSIS — M3501 Sicca syndrome with keratoconjunctivitis: Secondary | ICD-10-CM | POA: Diagnosis not present

## 2022-01-29 DIAGNOSIS — Z961 Presence of intraocular lens: Secondary | ICD-10-CM | POA: Diagnosis not present

## 2022-01-29 DIAGNOSIS — H353131 Nonexudative age-related macular degeneration, bilateral, early dry stage: Secondary | ICD-10-CM | POA: Diagnosis not present

## 2022-02-02 DIAGNOSIS — R739 Hyperglycemia, unspecified: Secondary | ICD-10-CM | POA: Diagnosis not present

## 2022-02-02 DIAGNOSIS — Z7983 Long term (current) use of bisphosphonates: Secondary | ICD-10-CM | POA: Diagnosis not present

## 2022-02-02 DIAGNOSIS — E785 Hyperlipidemia, unspecified: Secondary | ICD-10-CM | POA: Diagnosis not present

## 2022-02-02 DIAGNOSIS — N3941 Urge incontinence: Secondary | ICD-10-CM | POA: Diagnosis not present

## 2022-02-02 DIAGNOSIS — I700 Atherosclerosis of aorta: Secondary | ICD-10-CM | POA: Diagnosis not present

## 2022-02-02 DIAGNOSIS — M8580 Other specified disorders of bone density and structure, unspecified site: Secondary | ICD-10-CM | POA: Diagnosis not present

## 2022-02-02 DIAGNOSIS — I10 Essential (primary) hypertension: Secondary | ICD-10-CM | POA: Diagnosis not present

## 2022-02-02 DIAGNOSIS — K529 Noninfective gastroenteritis and colitis, unspecified: Secondary | ICD-10-CM | POA: Diagnosis not present

## 2022-02-02 DIAGNOSIS — K219 Gastro-esophageal reflux disease without esophagitis: Secondary | ICD-10-CM | POA: Diagnosis not present

## 2022-02-02 DIAGNOSIS — J9611 Chronic respiratory failure with hypoxia: Secondary | ICD-10-CM | POA: Diagnosis not present

## 2022-02-02 DIAGNOSIS — I951 Orthostatic hypotension: Secondary | ICD-10-CM | POA: Diagnosis not present

## 2022-02-02 DIAGNOSIS — R1310 Dysphagia, unspecified: Secondary | ICD-10-CM | POA: Diagnosis not present

## 2022-02-02 DIAGNOSIS — Z9181 History of falling: Secondary | ICD-10-CM | POA: Diagnosis not present

## 2022-02-02 DIAGNOSIS — G500 Trigeminal neuralgia: Secondary | ICD-10-CM | POA: Diagnosis not present

## 2022-02-02 DIAGNOSIS — Z79890 Hormone replacement therapy (postmenopausal): Secondary | ICD-10-CM | POA: Diagnosis not present

## 2022-02-02 DIAGNOSIS — I2510 Atherosclerotic heart disease of native coronary artery without angina pectoris: Secondary | ICD-10-CM | POA: Diagnosis not present

## 2022-02-02 DIAGNOSIS — Z7951 Long term (current) use of inhaled steroids: Secondary | ICD-10-CM | POA: Diagnosis not present

## 2022-02-02 DIAGNOSIS — Z9981 Dependence on supplemental oxygen: Secondary | ICD-10-CM | POA: Diagnosis not present

## 2022-02-02 DIAGNOSIS — Z7952 Long term (current) use of systemic steroids: Secondary | ICD-10-CM | POA: Diagnosis not present

## 2022-02-02 DIAGNOSIS — J439 Emphysema, unspecified: Secondary | ICD-10-CM | POA: Diagnosis not present

## 2022-02-02 DIAGNOSIS — Z792 Long term (current) use of antibiotics: Secondary | ICD-10-CM | POA: Diagnosis not present

## 2022-02-02 DIAGNOSIS — Z87891 Personal history of nicotine dependence: Secondary | ICD-10-CM | POA: Diagnosis not present

## 2022-02-02 DIAGNOSIS — I739 Peripheral vascular disease, unspecified: Secondary | ICD-10-CM | POA: Diagnosis not present

## 2022-02-02 DIAGNOSIS — R490 Dysphonia: Secondary | ICD-10-CM | POA: Diagnosis not present

## 2022-02-08 ENCOUNTER — Other Ambulatory Visit: Payer: Self-pay | Admitting: Family Medicine

## 2022-02-08 DIAGNOSIS — I2510 Atherosclerotic heart disease of native coronary artery without angina pectoris: Secondary | ICD-10-CM | POA: Diagnosis not present

## 2022-02-08 DIAGNOSIS — Z87891 Personal history of nicotine dependence: Secondary | ICD-10-CM | POA: Diagnosis not present

## 2022-02-08 DIAGNOSIS — I700 Atherosclerosis of aorta: Secondary | ICD-10-CM | POA: Diagnosis not present

## 2022-02-08 DIAGNOSIS — Z7983 Long term (current) use of bisphosphonates: Secondary | ICD-10-CM | POA: Diagnosis not present

## 2022-02-08 DIAGNOSIS — G500 Trigeminal neuralgia: Secondary | ICD-10-CM | POA: Diagnosis not present

## 2022-02-08 DIAGNOSIS — K529 Noninfective gastroenteritis and colitis, unspecified: Secondary | ICD-10-CM | POA: Diagnosis not present

## 2022-02-08 DIAGNOSIS — M8580 Other specified disorders of bone density and structure, unspecified site: Secondary | ICD-10-CM | POA: Diagnosis not present

## 2022-02-08 DIAGNOSIS — I951 Orthostatic hypotension: Secondary | ICD-10-CM | POA: Diagnosis not present

## 2022-02-08 DIAGNOSIS — R739 Hyperglycemia, unspecified: Secondary | ICD-10-CM | POA: Diagnosis not present

## 2022-02-08 DIAGNOSIS — Z792 Long term (current) use of antibiotics: Secondary | ICD-10-CM | POA: Diagnosis not present

## 2022-02-08 DIAGNOSIS — E785 Hyperlipidemia, unspecified: Secondary | ICD-10-CM | POA: Diagnosis not present

## 2022-02-08 DIAGNOSIS — R1310 Dysphagia, unspecified: Secondary | ICD-10-CM | POA: Diagnosis not present

## 2022-02-08 DIAGNOSIS — Z7952 Long term (current) use of systemic steroids: Secondary | ICD-10-CM | POA: Diagnosis not present

## 2022-02-08 DIAGNOSIS — Z9181 History of falling: Secondary | ICD-10-CM | POA: Diagnosis not present

## 2022-02-08 DIAGNOSIS — N3941 Urge incontinence: Secondary | ICD-10-CM | POA: Diagnosis not present

## 2022-02-08 DIAGNOSIS — I739 Peripheral vascular disease, unspecified: Secondary | ICD-10-CM | POA: Diagnosis not present

## 2022-02-08 DIAGNOSIS — J9611 Chronic respiratory failure with hypoxia: Secondary | ICD-10-CM | POA: Diagnosis not present

## 2022-02-08 DIAGNOSIS — I10 Essential (primary) hypertension: Secondary | ICD-10-CM | POA: Diagnosis not present

## 2022-02-08 DIAGNOSIS — M85852 Other specified disorders of bone density and structure, left thigh: Secondary | ICD-10-CM

## 2022-02-08 DIAGNOSIS — R490 Dysphonia: Secondary | ICD-10-CM | POA: Diagnosis not present

## 2022-02-08 DIAGNOSIS — Z9981 Dependence on supplemental oxygen: Secondary | ICD-10-CM | POA: Diagnosis not present

## 2022-02-08 DIAGNOSIS — J439 Emphysema, unspecified: Secondary | ICD-10-CM | POA: Diagnosis not present

## 2022-02-08 DIAGNOSIS — Z7951 Long term (current) use of inhaled steroids: Secondary | ICD-10-CM | POA: Diagnosis not present

## 2022-02-08 DIAGNOSIS — K219 Gastro-esophageal reflux disease without esophagitis: Secondary | ICD-10-CM | POA: Diagnosis not present

## 2022-02-08 DIAGNOSIS — Z79890 Hormone replacement therapy (postmenopausal): Secondary | ICD-10-CM | POA: Diagnosis not present

## 2022-02-08 NOTE — Telephone Encounter (Signed)
Fosamax Last filled:  01/13/22, #4 Last OV:  09/09/21, hosp f/u Next OV:  02/09/22, 6 mo f/u

## 2022-02-09 ENCOUNTER — Ambulatory Visit (INDEPENDENT_AMBULATORY_CARE_PROVIDER_SITE_OTHER): Payer: PPO | Admitting: Family Medicine

## 2022-02-09 ENCOUNTER — Encounter: Payer: Self-pay | Admitting: Family Medicine

## 2022-02-09 VITALS — BP 134/78 | HR 70 | Temp 98.1°F | Ht 63.0 in | Wt 138.4 lb

## 2022-02-09 DIAGNOSIS — R32 Unspecified urinary incontinence: Secondary | ICD-10-CM

## 2022-02-09 DIAGNOSIS — J301 Allergic rhinitis due to pollen: Secondary | ICD-10-CM | POA: Diagnosis not present

## 2022-02-09 DIAGNOSIS — R3915 Urgency of urination: Secondary | ICD-10-CM

## 2022-02-09 DIAGNOSIS — Z23 Encounter for immunization: Secondary | ICD-10-CM | POA: Diagnosis not present

## 2022-02-09 DIAGNOSIS — J9611 Chronic respiratory failure with hypoxia: Secondary | ICD-10-CM | POA: Diagnosis not present

## 2022-02-09 DIAGNOSIS — N390 Urinary tract infection, site not specified: Secondary | ICD-10-CM

## 2022-02-09 DIAGNOSIS — J439 Emphysema, unspecified: Secondary | ICD-10-CM | POA: Diagnosis not present

## 2022-02-09 DIAGNOSIS — N3941 Urge incontinence: Secondary | ICD-10-CM

## 2022-02-09 LAB — POC URINALSYSI DIPSTICK (AUTOMATED)
Bilirubin, UA: NEGATIVE
Glucose, UA: NEGATIVE
Ketones, UA: NEGATIVE
Nitrite, UA: POSITIVE
Protein, UA: NEGATIVE
Spec Grav, UA: <=1.005 — AB (ref 1.010–1.025)
Urobilinogen, UA: 0.2 E.U./dL
pH, UA: 8 (ref 5.0–8.0)

## 2022-02-09 MED ORDER — FLUTICASONE PROPIONATE 50 MCG/ACT NA SUSP: 2.0000 | Freq: Every day | NASAL | 3 refills | Status: DC

## 2022-02-09 MED ORDER — PREMARIN 0.625 MG/GM VA CREA: TOPICAL_CREAM | Freq: Every day | VAGINAL | 0 refills | Status: DC

## 2022-02-09 MED ORDER — LORATADINE 10 MG PO TABS: 10.0000 mg | ORAL_TABLET | Freq: Every day | ORAL | 3 refills | Status: DC | PRN

## 2022-02-09 MED ORDER — SULFAMETHOXAZOLE-TRIMETHOPRIM 800-160 MG PO TABS: 1.0000 | ORAL_TABLET | Freq: Two times a day (BID) | ORAL | 0 refills | Status: DC

## 2022-02-09 NOTE — Patient Instructions (Addendum)
Flu shot today Call to schedule follow up with Dr Zoila Shutter office  Keep appointment with urologist.  Try over the counter antihistamine daily by mouth such as claritin, allegra.  May try nasal steroid flonase 2 squirts into each nostril daily.  You may benefit from newest COVID shot and RSV shot.  Return as needed or in 6 months for physical/wellness visit

## 2022-02-09 NOTE — Progress Notes (Unsigned)
[name], female    DOB: [birth year], 81 y.o.   MRN: [id number]  This visit was conducted in person.  BP 134/78   Pulse 70   Temp 98.1 F (36.7 C) (Temporal)   Ht 5\' 3"  (1.6 m)   Wt 138 lb 6 oz (62.8 kg)   SpO2 94% Comment: 4 L O2  BMI 24.51 kg/m    CC: 6 mo f/u visit  Subjective:   HPI: [name] [name] is a 81 y.o. female presenting on 02/09/2022 for Follow-up (Here for 6 mo f/u. Also, unusual urine odor, urinary incontinence, fatigue and fever max- 100.5.  Sxs started 02/06/22. Pt accompanied by daughter, [name].) and Sinus Problem (C/o nasal congestion/drainage. H/o sinus issues but worse this time of year. Has not tried anything. ) Here with daughter [name].  Saw pulmonology 09/2021 Tammy Parrett for chronic severe oxygen dependent (4L) COPD managed with triple therapy - brovana and pulmicort nebulizers twice daily and spiriva. She continues daily prednisone 10mg . She previously declined pulmonary rehab. She received [id number] vaccine at last pulm visit.   Last visit we referred to [facility] - did receive almost 4-5 months of home physical therapy with benefit.   H/o recurrent UTI 03/2021, 04/2021 treated with bactrim, again treated 07/2021 with cipro course. UCx at that time grew pan-sensitive E coli. I also recommended restarting cranberry tablets and vaginal premarin at that time - she has been taking cranberry tablets daily but forgets vaginal premarin.   She states symptoms never really went away.  10 days ago she started having foul urine smell, fatigue, low grade fever 100.5, body aches, increased urinary urgency, urine is darker.  No dysuria, hematuria, flank pain or nausea.   Chronic sinus drainage. Notes increased cough with increased sputum production. + ST and PNDrainage.  Neti pot use helps. No sinus pressure or pain, headache.      Relevant past medical, surgical, family and social history reviewed and updated as indicated. Interim medical  history since our last visit reviewed. Allergies and medications reviewed and updated. Outpatient Medications Prior to Visit  Medication Sig Dispense Refill   albuterol (PROVENTIL) (2.5 MG/3ML) 0.083% nebulizer solution USE ONE VIAL IN NEBULIZER DAILY AS NEEDED FOR RESCUE 120 mL 6   albuterol (VENTOLIN HFA) 108 (90 Base) MCG/ACT inhaler INHALE 2 PUFFS EVERY 4 HOURS AS NEEDED 8.5 g 5   alendronate (FOSAMAX) 70 MG tablet Take 1 tablet (70 mg total) by mouth once a week. Take with a full glass of water on an empty stomach. 4 tablet 11   budesonide (PULMICORT) 0.5 MG/2ML nebulizer solution INHALE 1 VIAL VIA NEBULIZER TWICE DAILY 120 mL 2   buPROPion (WELLBUTRIN SR) 150 MG 12 hr tablet TAKE ONE TABLET BY MOUTH TWICE DAILY 180 tablet 0   CRANBERRY PO Take 2 tablets by mouth every morning.      ezetimibe (ZETIA) 10 MG tablet TAKE 1 TABLET BY MOUTH DAILY 90 tablet 3   formoterol (PERFOROMIST) 20 MCG/2ML nebulizer solution Take 2 mLs (20 mcg total) by nebulization 2 (two) times daily. 120 mL 11   losartan (COZAAR) 100 MG tablet TAKE 1 TABLET BY MOUTH DAILY 90 tablet 1   metoprolol tartrate (LOPRESSOR) 25 MG tablet TAKE ONE TABLET BY MOUTH TWICE DAILY 180 tablet 3   mirtazapine (REMERON) 15 MG tablet TAKE 1 TABLET BY MOUTH AT BEDTIME 30 tablet 4   Multiple Vitamins-Minerals (PRESERVISION AREDS 2) CAPS Take 2 capsules by mouth daily.  pravastatin (PRAVACHOL) 80 MG tablet TAKE ONE TABLET BY MOUTH AT BEDTIME 90 tablet 3   predniSONE (DELTASONE) 10 MG tablet TAKE 1 TABLET BY MOUTH DAILY WITH BREAKFAST 30 tablet 1   tiotropium (SPIRIVA) 18 MCG inhalation capsule Place 1 capsule (18 mcg total) into inhaler and inhale daily. 90 capsule 3   conjugated estrogens (PREMARIN) vaginal cream Place 1 Applicatorful vaginally daily. Apply 0.5mg  (pea-sized amount)  just inside the vaginal introitus with a finger-tip on  Monday, Wednesday and Friday nights. (Patient taking differently: Place 1 Applicatorful vaginally as  needed. Apply 0.5mg  (pea-sized amount)  just inside the vaginal introitus with a finger-tip on  Monday, Wednesday and Friday nights.) 30 g 12   No facility-administered medications prior to visit.     Per HPI unless specifically indicated in ROS section below Review of Systems  Objective:  BP 134/78   Pulse 70   Temp 98.1 F (36.7 C) (Temporal)   Ht 5\' 3"  (1.6 m)   Wt 138 lb 6 oz (62.8 kg)   SpO2 94% Comment: 4 L O2  BMI 24.51 kg/m   Wt Readings from Last 3 Encounters:  02/09/22 138 lb 6 oz (62.8 kg)  10/13/21 134 lb 3.2 oz (60.9 kg)  09/09/21 135 lb (61.2 kg)      Physical Exam Vitals and nursing note reviewed.  Constitutional:      Appearance: Normal appearance. She is not ill-appearing.     Comments: Supplemental oxygen vs [place] at 4L  HENT:     Head: Normocephalic and atraumatic.     Nose: Congestion and rhinorrhea present. Rhinorrhea is clear.     Right Turbinates: Not enlarged, swollen or pale.     Left Turbinates: Not enlarged, swollen or pale.     Right Sinus: No maxillary sinus tenderness or frontal sinus tenderness.     Left Sinus: No maxillary sinus tenderness or frontal sinus tenderness.     Mouth/Throat:     Mouth: Mucous membranes are moist.     Pharynx: Oropharynx is clear.  Cardiovascular:     Rate and Rhythm: Normal rate and regular rhythm.     Pulses: Normal pulses.     Heart sounds: Normal heart sounds. No murmur heard. Pulmonary:     Effort: Pulmonary effort is normal. No respiratory distress.     Breath sounds: Normal breath sounds. No wheezing, rhonchi or rales.     Comments: Coarse breath sounds Abdominal:     General: Bowel sounds are normal. There is no distension.     Palpations: Abdomen is soft. There is no mass.     Tenderness: There is no abdominal tenderness. There is no right CVA tenderness, left CVA tenderness, guarding or rebound.     Hernia: No hernia is present.  Musculoskeletal:     Right lower leg: Edema (tr) present.     Left  lower leg: No edema.  Skin:    General: Skin is warm and dry.     Findings: No rash.  Neurological:     Mental Status: She is alert.  Psychiatric:        Mood and Affect: Mood normal.        Behavior: Behavior normal.       Results for orders placed or performed in visit on 02/09/22  POCT Urinalysis Dipstick (Automated)  Result Value Ref Range   Color, UA yellow    Clarity, UA cloudy    Glucose, UA Negative Negative   Bilirubin, UA negative  Ketones, UA negative    Spec Grav, UA <=1.005 (A) 1.010 - 1.025   Blood, UA 1+    pH, UA 8.0 5.0 - 8.0   Protein, UA Negative Negative   Urobilinogen, UA 0.2 0.2 or 1.0 E.U./dL   Nitrite, UA positive    Leukocytes, UA Moderate (2+) (A) Negative  Micro:  WBC few  RBC rare  Bact 2+ mixed  Casts none  Epi rare  UCx sent  Assessment & Plan:   Problem List Items Addressed This Visit     GOLD 4 COPD with emphysema    Appreciate pulmonology care - she is due for f/u - recommend call and schedule appt. She can check with pulm whether she would benefit from pulmonary rehab after completing recent HHPT course with benefit.  Encouraged she get latest bivalent COVID vaccine as well as RSV vaccine at local pharmacy.       Relevant Medications   loratadine (CLARITIN) 10 MG tablet   fluticasone (FLONASE) 50 MCG/ACT nasal spray   Chronic respiratory failure with hypoxia (HCC)    Continues higher dose continuous supplemental oxygen at 4L via [place].       Recurrent UTI - Primary    Endorses symptoms never fully resolved from last UTI treated 07/2021 (cipro). Now over the past 10 days notes worsening UTI symptoms including foul smelling urine, low grade temperature, fatigue, body aches, increased urinary urgency and darker urine.  UA today suspicious for infection.  UCx sent, start bactrim DS 5d course in interim.  She does have close urology f/u planned for next week.  She states she's been taking cranberry tablets daily but does forget her  vaginal estrogen - refilled today to get her to urology appointment.       Relevant Medications   sulfamethoxazole-trimethoprim (BACTRIM DS) 800-160 MG tablet   Other Relevant Orders   Urine Culture   Urge incontinence of urine    Chronic issue.       Allergic rhinitis    Endorses chronic sinus drainage and congestion, acutely worse over the last few days.  She is not currently taking any allergy regimen for this -recommend start claritin or allegra + flonase, update with effect.       Other Visit Diagnoses     Need for influenza vaccination       Relevant Orders   Flu Vaccine QUAD High Dose(Fluad) (Completed)   Urinary incontinence, unspecified type       Urinary urgency       Relevant Orders   POCT Urinalysis Dipstick (Automated) (Completed)        Meds ordered this encounter  Medications   loratadine (CLARITIN) 10 MG tablet    Sig: Take 1 tablet (10 mg total) by mouth daily as needed for allergies.    Dispense:  90 tablet    Refill:  3   fluticasone (FLONASE) 50 MCG/ACT nasal spray    Sig: Place 2 sprays into both nostrils daily.    Dispense:  48 g    Refill:  3   conjugated estrogens (PREMARIN) vaginal cream    Sig: Place vaginally daily. Apply 0.5mg  (pea-sized amount)  just inside the vaginal introitus with a finger-tip on  Monday, Wednesday and Friday nights.    Dispense:  30 g    Refill:  0   sulfamethoxazole-trimethoprim (BACTRIM DS) 800-160 MG tablet    Sig: Take 1 tablet by mouth 2 (two) times daily.    Dispense:  10 tablet  Refill:  0   Orders Placed This Encounter  Procedures   Urine Culture   Flu Vaccine QUAD High Dose(Fluad)   POCT Urinalysis Dipstick (Automated)     Patient Instructions  Flu shot today Call to schedule follow up with Dr Zoila Shutter office  Keep appointment with urologist.  Try over the counter antihistamine daily by mouth such as claritin, allegra.  May try nasal steroid flonase 2 squirts into each nostril daily.  You may  benefit from newest COVID shot and RSV shot.  Return as needed or in 6 months for physical/wellness visit   Follow up plan: Return in about 6 months (around 08/10/2022) for annual exam, prior fasting for blood work, medicare wellness visit.  Ria Bush, MD

## 2022-02-10 NOTE — Assessment & Plan Note (Addendum)
Appreciate pulmonology care - she is due for f/u - recommend call and schedule appt. She can check with pulm whether she would benefit from pulmonary rehab after completing recent HHPT course with benefit.  Encouraged she get latest bivalent COVID vaccine as well as RSV vaccine at local pharmacy.

## 2022-02-10 NOTE — Assessment & Plan Note (Signed)
Chronic issue.  

## 2022-02-10 NOTE — Assessment & Plan Note (Signed)
Endorses symptoms never fully resolved from last UTI treated 07/2021 (cipro). Now over the past 10 days notes worsening UTI symptoms including foul smelling urine, low grade temperature, fatigue, body aches, increased urinary urgency and darker urine.  UA today suspicious for infection.  UCx sent, start bactrim DS 5d course in interim.  She does have close urology f/u planned for next week.  She states she's been taking cranberry tablets daily but does forget her vaginal estrogen - refilled today to get her to urology appointment.

## 2022-02-10 NOTE — Assessment & Plan Note (Signed)
Endorses chronic sinus drainage and congestion, acutely worse over the last few days.  She is not currently taking any allergy regimen for this -recommend start claritin or allegra + flonase, update with effect.

## 2022-02-10 NOTE — Assessment & Plan Note (Signed)
Continues higher dose continuous supplemental oxygen at 4L via [place].

## 2022-02-12 LAB — URINE CULTURE
MICRO NUMBER:: 13937804
SPECIMEN QUALITY:: ADEQUATE

## 2022-02-17 NOTE — Progress Notes (Deleted)
02/17/22 12:40 PM   [name] [name] [birth year] [id number]  Referring provider:  Ria Bush, MD [address] [place],  [place] [address]  Urological history  High risk hematuria  - Former smoker  - CT urogram on 07/2019 was within normal limits, kidneys are within normal limits. No lesions or calculi, or hydronephrosis.  - Cystoscopy with Dr. Erlene Quan on 07/2019 NED - no reports of gross heme - UA ***  2. Vaginal atrophy  - Managed with vaginal estrogen   3. Recurrent Urinary tract infections  -Contributing factors of age, vaginal atrophy, incontinence -documented positive urine cultures over the last year  February 09, 2022 E. Coli  August 05, 2021 E. Coli  April 28, 2021 E. coli -Cranberry tablets and vaginal estrogen cream  4. Urge incontinence  - On conservative management   No chief complaint on file.    HPI: [name] [name] is a 81 y.o.female who presents today for 1 year follow-up with UA.   UA ***  PVR ***   PMH: Past Medical History:  Diagnosis Date   Chronic cystitis    recurrent UTI (4+ in last year), on suppressive doxy (uro [place])   Colon polyp    COPD, severe 01/27/07       Depression with anxiety    Diverticular disease 10/09/09   CT abd no sigmoid mass DDD L5/S1   Dyslipidemia    GERD (gastroesophageal reflux disease)    Hematuria    h/o kidney stones   Hiatal hernia    Hypertension    Left scapholunate ligament tear 2014   found on imaging   Lung cancer ([place]) 1987   Partial RLL Lobectomy.   Osteoarthritis    Osteopenia 08/2011   DEXA: L femur -1.6   Peptic ulcer    Spinal abscess ([place]) 2000   secondary to periodontal disease    Surgical History: Past Surgical History:  Procedure Laterality Date   BLADDER SURGERY     BREAST BIOPSY Left 03/99   neg   CHOLECYSTECTOMY  02/10/09   COLONOSCOPY WITH PROPOFOL N/A 04/30/2014   Procedure: COLONOSCOPY WITH PROPOFOL;  Surgeon: Irene Shipper, MD;  Location: [facility];   Service: Endoscopy;  Laterality: N/A;   CYSTOSCOPY  02/11/07 and 09/25/08   Dr. Jacqlyn Larsen, normal   CYSTOSCOPY  07/2019   WNL Erlene Quan)   DEXA  08/2011   T femur -1.6, T spine -0.6   MOUTH SURGERY  2000   Hosp [facility] pneumonia sepsis due to gum surgery   OOPHORECTOMY     REPLACEMENT TOTAL KNEE  09/2009   Right   TOTAL ABDOMINAL HYSTERECTOMY  07/18/01   benign reason, BSO, vag repair secondary prolapse   TUBAL LIGATION     bilateral    Home Medications:  Allergies as of 02/18/2022       Reactions   Keflex [cephalexin] Shortness Of Breath   Sulbactam Itching   Penicillins    REACTION: hives        Medication List        Accurate as of February 17, 2022 12:40 PM. If you have any questions, ask your nurse or doctor.          albuterol (2.5 MG/3ML) 0.083% nebulizer solution Commonly known as: PROVENTIL USE ONE VIAL IN NEBULIZER DAILY AS NEEDED FOR RESCUE   albuterol 108 (90 Base) MCG/ACT inhaler Commonly known as: VENTOLIN HFA INHALE 2 PUFFS EVERY 4 HOURS AS NEEDED   alendronate 70 MG tablet Commonly known  as: FOSAMAX TAKE 1 TABLET EVERY 7 DAYS WITH A FULL GLASS OF WATER ON AN EMPTY STOMACH DO NOT LIE DOWN FOR AT LEAST 30 MIN   budesonide 0.5 MG/2ML nebulizer solution Commonly known as: PULMICORT INHALE 1 VIAL VIA NEBULIZER TWICE DAILY   buPROPion 150 MG 12 hr tablet Commonly known as: WELLBUTRIN SR TAKE ONE TABLET BY MOUTH TWICE DAILY   CRANBERRY PO Take 2 tablets by mouth every morning.   ezetimibe 10 MG tablet Commonly known as: ZETIA TAKE 1 TABLET BY MOUTH DAILY   fluticasone 50 MCG/ACT nasal spray Commonly known as: FLONASE Place 2 sprays into both nostrils daily.   formoterol 20 MCG/2ML nebulizer solution Commonly known as: Perforomist Take 2 mLs (20 mcg total) by nebulization 2 (two) times daily.   loratadine 10 MG tablet Commonly known as: CLARITIN Take 1 tablet (10 mg total) by mouth daily as needed for allergies.   losartan 100 MG  tablet Commonly known as: COZAAR TAKE 1 TABLET BY MOUTH DAILY   metoprolol tartrate 25 MG tablet Commonly known as: LOPRESSOR TAKE ONE TABLET BY MOUTH TWICE DAILY   mirtazapine 15 MG tablet Commonly known as: REMERON TAKE 1 TABLET BY MOUTH AT BEDTIME   pravastatin 80 MG tablet Commonly known as: PRAVACHOL TAKE ONE TABLET BY MOUTH AT BEDTIME   predniSONE 10 MG tablet Commonly known as: DELTASONE TAKE 1 TABLET BY MOUTH DAILY WITH BREAKFAST   Premarin vaginal cream Generic drug: conjugated estrogens Place vaginally daily. Apply 0.5mg  (pea-sized amount)  just inside the vaginal introitus with a finger-tip on  Monday, Wednesday and Friday nights.   PreserVision AREDS 2 Caps Take 2 capsules by mouth daily.   sulfamethoxazole-trimethoprim 800-160 MG tablet Commonly known as: BACTRIM DS Take 1 tablet by mouth 2 (two) times daily.   tiotropium 18 MCG inhalation capsule Commonly known as: SPIRIVA Place 1 capsule (18 mcg total) into inhaler and inhale daily.        Allergies:  Allergies  Allergen Reactions   Keflex [Cephalexin] Shortness Of Breath   Sulbactam Itching   Penicillins     REACTION: hives    Family History: Family History  Problem Relation Age of Onset   Pancreatitis Mother    Heart disease Father    Hypertension Father    Emphysema Father    Arthritis Brother    Hypertension Brother    Cancer Maternal Aunt        ovarian cancer   Kidney disease Neg Hx    Prostate cancer Neg Hx    Bladder Cancer Neg Hx     Social History:  reports that she quit smoking about 26 years ago. Her smoking use included cigarettes. She has a 102.00 pack-year smoking history. She has never used smokeless tobacco. She reports current alcohol use. She reports that she does not use drugs.   Physical Exam: There were no vitals taken for this visit.  Constitutional:  Well nourished. Alert and oriented, No acute distress. HEENT: [place] AT, moist mucus membranes.  Trachea midline,  no masses. Cardiovascular: No clubbing, cyanosis, or edema. Respiratory: Normal respiratory effort, no increased work of breathing. GI: Abdomen is soft, non tender, non distended, no abdominal masses. Liver and spleen not palpable.  No hernias appreciated.  Stool sample for occult testing is not indicated.   GU: No CVA tenderness.  No bladder fullness or masses.  *** external genitalia, *** pubic hair distribution, no lesions.  Normal urethral meatus, no lesions, no prolapse, no discharge.  No urethral masses, tenderness and/or tenderness. No bladder fullness, tenderness or masses. *** vagina mucosa, *** estrogen effect, no discharge, no lesions, *** pelvic support, *** cystocele and *** rectocele noted.  No cervical motion tenderness.  Uterus is freely mobile and non-fixed.  No adnexal/parametria masses or tenderness noted.  Anus and perineum are without rashes or lesions.   ***  Skin: No rashes, bruises or suspicious lesions. Lymph: No cervical or inguinal adenopathy. Neurologic: Grossly intact, no focal deficits, moving all 4 extremities. Psychiatric: Normal mood and affect.    Laboratory Data:    Latest Ref Rng & Units 07/29/2021   10:46 AM 04/28/2021    4:12 PM 06/11/2020   11:30 AM  CMP  Glucose 70 - 99 mg/dL 95  112  102   BUN 6 - 23 mg/dL 22  17  9    Creatinine 0.40 - 1.20 mg/dL 0.91  0.91  0.81   Sodium 135 - 145 mEq/L 140  139  141   Potassium 3.5 - 5.1 mEq/L 4.2  4.7  4.5   Chloride 96 - 112 mEq/L 95  96  98   CO2 19 - 32 mEq/L 39  38  40   Calcium 8.4 - 10.5 mg/dL 9.4  9.6  9.4   Total Protein 6.0 - 8.3 g/dL 6.4   6.5   Total Bilirubin 0.2 - 1.2 mg/dL 0.5   0.5   Alkaline Phos 39 - 117 U/L 28   42   AST 0 - 37 U/L 21   22   ALT 0 - 35 U/L 19   15        Latest Ref Rng & Units 07/29/2021   10:46 AM 04/28/2021    4:12 PM 06/11/2020   11:30 AM  CBC  WBC 4.0 - 10.5 K/uL 9.6  7.0  7.6   Hemoglobin 12.0 - 15.0 g/dL 12.4  11.9  12.5   Hematocrit 36.0 - 46.0 % 38.0  37.0  37.6    Platelets 150.0 - 400.0 K/uL 182.0  152.0  169.0     Component     Latest Ref Rng 07/29/2021  Hemoglobin A1C     4.6 - 6.5 % 5.7    Urinalysis See Epic and HPI I have reviewed the labs.   Assessment & Plan:    High risk hematuria  - Hematuria workup completed 07/2019 with CT urogram and cystoscopy that showed NED  - UA today showed no hematuria  - No reports of gross hematuria   Vaginal atrophy  - Encouraged to continue using vaginal estrogen   Urge incontinence  - Worsens during the nighttime  - Offered OAB medications but she is not interested in starting a new medication  - continue conservative management    No follow-ups on file.  [facility] [address], [place], [place] [address] [phone]  Zara Council, PA-C

## 2022-02-18 ENCOUNTER — Telehealth: Payer: Self-pay

## 2022-02-18 ENCOUNTER — Ambulatory Visit: Payer: PPO | Admitting: Urology

## 2022-02-18 ENCOUNTER — Encounter: Payer: Self-pay | Admitting: Urology

## 2022-02-18 DIAGNOSIS — R319 Hematuria, unspecified: Secondary | ICD-10-CM

## 2022-02-18 DIAGNOSIS — N3941 Urge incontinence: Secondary | ICD-10-CM

## 2022-02-18 DIAGNOSIS — N952 Postmenopausal atrophic vaginitis: Secondary | ICD-10-CM

## 2022-02-18 MED ORDER — HYDROXYZINE HCL 10 MG PO TABS: 10.0000 mg | ORAL_TABLET | Freq: Every day | ORAL | 0 refills | Status: DC | PRN

## 2022-02-18 NOTE — Addendum Note (Signed)
Addended by: Tonia Ghent on: 02/18/2022 02:00 PM   Modules accepted: Orders

## 2022-02-18 NOTE — Telephone Encounter (Signed)
Spoke with pt relaying Dr. Josefine Class message.  Pt verbalizes understanding and expresses her thanks.

## 2022-02-18 NOTE — Telephone Encounter (Signed)
Received following message via pt's son's [name]- [birth year]1965), MyChart:   Hey Dr. Darnell Level. My niece is getting married this weekend in [place]. I'm taking mom. She seems to get panicked when we go to away gatherings with family. When she panics her heart rate goes up and she has a hard time getting her breathing under control.  We are taking a wheel chair so she doesn't have to walk.  Is there something she can take to take some of the anxiety away? I just worry about the panic when she gets away from her home.  Spoke with pt confirming she is taking mirtazapine 15 mg daily h.s. and bupropion 150 mg BID.  States she is but sometimes she still gets anxious and "panicky" and her breathing gets heavier when in a crowd.  Pt is aware Dr. Darnell Level is out out office. However, I will fwd message to Dr. Darnell Level and Dr. Damita Dunnings.  In the meantime, pt states she will take her meds.  Encouraged pt to schedule OV if she thinks med is not helping.  Verbalizes understanding.

## 2022-02-18 NOTE — Telephone Encounter (Signed)
Agree with this. Thank yoy.

## 2022-02-18 NOTE — Telephone Encounter (Signed)
She could try taking hydroxyzine as needed, with sedation caution.   I wouldn't take it with claritin/loratadine.   Please update [place] as needed and reasonable to f/u with PCP to discuss options.  Thanks.  Routed to PCP as FYI.

## 2022-02-23 ENCOUNTER — Telehealth: Payer: Self-pay | Admitting: *Deleted

## 2022-02-23 NOTE — Telephone Encounter (Signed)
PLEASE NOTE: All timestamps contained within this report are represented as [place] Standard Time. CONFIDENTIALTY NOTICE: This fax transmission is intended only for the addressee. It contains information that is legally privileged, confidential or otherwise protected from use or disclosure. If you are not the intended recipient, you are strictly prohibited from reviewing, disclosing, copying using or disseminating any of this information or taking any action in reliance on or regarding this information. If you have received this fax in error, please notify us immediately by telephone so that we can arrange for its return to [place]. Phone: [phone], Toll-Free: [phone], Fax: [phone] Page: 1 of 2 Call Id: [id number] [place] RECORD AccessNurse Patient Name: [name] [name] [name] Gender: Female DOB: 1941/04/18 Age: 81 Y 11 M 1 D Return Phone Number: [phone] (Primary) Address: City/ State/ Zip: [place] [address] [name] Provider Ria Bush - MD Contact Type Call Who Is Calling Patient / Member / Family / Caregiver Call Type Triage / Clinical Relationship To Patient Self Return Phone Number [phone] (Primary) Chief Complaint BREATHING - shortness of breath or sounds breathless Reason for Call Symptomatic / Request for [name] states she is out of her medication and needs a renewal on her refills. She is having breathing issues. States she cant breathe she has COPD. Translation No Nurse Assessment Nurse: Lavina Hamman, RN, Thomasena Edis Date/Time (Eastern Time): 02/22/2022 5:47:34 PM Confirm and document reason for call. If symptomatic, describe symptoms. ---Caller states history of COPD and is having more trouble breathing with exertion. Does the patient have any new or  worsening symptoms? ---Yes Will a triage be completed? ---Yes Related visit to physician within the last 2 weeks? ---No Does the PT have any chronic conditions? (i.e. diabetes, asthma, this includes High risk factors for pregnancy, etc.) ---Yes List chronic conditions. ---COPD Is this a behavioral health or substance abuse call? ---No Guidelines Guideline Title Affirmed Question Affirmed Notes Nurse Date/Time (Eastern Time) COPD Oxygen Monitoring and Hypoxia [1] MILD difficulty breathing (e.g., minimal/no SOB at rest, SOB with walking) AND [2] worse than normal Lavina Hamman, RN, Thomasena Edis 02/22/2022 5:48:24 PM Disp. Time [name] Time) Disposition Final User 02/22/2022 5:45:14 PM Send to Urgent [place] NOTE: All timestamps contained within this report are represented as [place] Standard Time. CONFIDENTIALTY NOTICE: This fax transmission is intended only for the addressee. It contains information that is legally privileged, confidential or otherwise protected from use or disclosure. If you are not the intended recipient, you are strictly prohibited from reviewing, disclosing, copying using or disseminating any of this information or taking any action in reliance on or regarding this information. If you have received this fax in error, please notify us immediately by telephone so that we can arrange for its return to [place]. Phone: [phone], Toll-Free: [phone], Fax: [phone] Page: 2 of 2 Call Id: [id number] [place]. Time [name] Time) Disposition Final User 02/22/2022 5:52:42 PM See PCP within 24 Hours Yes Lavina Hamman, RN, Thomasena Edis Final Disposition 02/22/2022 5:52:42 PM See PCP within 24 Hours Yes Lavina Hamman, RN, Thomasena Edis Caller Disagree/Comply Comply Caller Understands Yes PreDisposition InappropriateToAsk Care Advice Given Per Guideline * IF OFFICE WILL BE OPEN: You need to be examined within the next 24 hours. Call your doctor (or NP/PA) when the office opens and make an  appointment. SEE PCP WITHIN 24 HOURS: CALL BACK IF: * Fever higher than 100.4  F (38.0 C) * You become worse CARE ADVICE given per COPD Oxygen Monitoring and Hypoxia (Adult) guideline. NOTE TO TRIAGER - PATIENT INTERVENTIONS: * The nurse triager may want to recommend one or more of the following interventions to the patient or caregiver: Comments User: Farrel Gobble, RN Date/Time [name] Time): 02/22/2022 5:50:21 PM Oxygen level 99% (on home oxygen for the past 10 years) Referrals REFERRED TO PCP OFFICE

## 2022-02-23 NOTE — Telephone Encounter (Addendum)
Spoke to  patient by telephone and was advised that she has been having spells of having a hard time catching her breathe with activities. Patient stated that she finished the Prednisone about a week ago and is not sure if she needs a refill. Patient stated that she does not know that the Prednisone helped that much. Patient stated that she is more tired since being off of the Prednisone. Patient stated that she feels at times she is having panic attacks and they seem to be getting worse. Patient stated that she did take Hydroxyzine once Saturday night which seemed to help some. Patient stated that she has COPD and knows that her breathing is going to gradually worse. Patient stated that her breathing has been getting worse the past few months. Patient stated that she has a pulmonologist and she saw him about 2 months ago. Patient wants to know if she should come in and be seen to determine if her problem is her breathing or anxiety. Patient was given ER and 911 precautions and she verbalized understanding.

## 2022-02-23 NOTE — Telephone Encounter (Signed)
Ok to schedule office visit for further evaluation.  She is also due for lung doctor f/u - do recommend she call and schedule this.  Honestly pulmonary rehab may significantly help her - do recommend she try to get set up with this if pulmonology agrees.

## 2022-02-23 NOTE — Telephone Encounter (Signed)
Patient  notified as instructed by telephone and verbalized understanding. Patient was given the telephone number for pulmonary. Patient was offered an appointment with Dr. Danise Mina which she declined stating that she will call her pulmonologist to get an appointment. Patient was advised to call the office back if she needs anything from Dr. Danise Mina. Patient was again given ER/911 precautions and she verbalized understanding.

## 2022-02-24 ENCOUNTER — Ambulatory Visit
Admission: RE | Admit: 2022-02-24 | Discharge: 2022-02-24 | Disposition: A | Discharge status: Home / Self Care | Payer: PPO | Source: Ambulatory Visit | Attending: Student in an Organized Health Care Education/Training Program | Admitting: Student in an Organized Health Care Education/Training Program

## 2022-02-24 ENCOUNTER — Ambulatory Visit (INDEPENDENT_AMBULATORY_CARE_PROVIDER_SITE_OTHER): Payer: PPO | Admitting: Student in an Organized Health Care Education/Training Program

## 2022-02-24 ENCOUNTER — Encounter: Payer: Self-pay | Admitting: Student in an Organized Health Care Education/Training Program

## 2022-02-24 VITALS — BP 134/72 | HR 65 | Ht 64.0 in | Wt 134.0 lb

## 2022-02-24 DIAGNOSIS — J432 Centrilobular emphysema: Secondary | ICD-10-CM

## 2022-02-24 DIAGNOSIS — R0602 Shortness of breath: Secondary | ICD-10-CM | POA: Diagnosis not present

## 2022-02-24 DIAGNOSIS — J449 Chronic obstructive pulmonary disease, unspecified: Secondary | ICD-10-CM | POA: Diagnosis not present

## 2022-02-24 MED ORDER — TRELEGY ELLIPTA 200-62.5-25 MCG/ACT IN AEPB: 1.0000 | INHALATION_SPRAY | Freq: Every day | RESPIRATORY_TRACT | 11 refills | Status: DC

## 2022-02-24 MED ORDER — IPRATROPIUM-ALBUTEROL 0.5-2.5 (3) MG/3ML IN SOLN: 3.0000 mL | RESPIRATORY_TRACT | 11 refills | Status: AC | PRN

## 2022-02-24 NOTE — Progress Notes (Signed)
Synopsis: Presenting for acute follow up due to shortness of breath  Assessment & Plan:   #GOLD IV E COPD #Centrilobular emphysema  Presents for evaluation and management of COPD. She is not in an exacerbation today but does appear visibly short of breath. She had been maintained on multiple inhalers which I will consolidate into Trelegy Ellipta 1 puff once daily. I have asked her to discontinue her spiriva handihaler as well as the nebulized LAMA and ICS. I will have her start on duo-nebs as needed as rescue nebulizers. Furthermore, today, I will obtain a CXR to rule out an infiltrates, effusions, or pneumothorax. I will also obtain a CT scan of the chest to evaluate the degree of her emphysema to assess her candidacy for bronchoscopic lung volume reduction. I will also obtain a pulmonary function test to evaluate her spirometry, FEV1, DLCO, and lung volumes. This is to track her COPD as well as evaluate for lung volume reduction candidacy. Finally, I will send a referral to pulmonary rehabilitation.  - Fluticasone-Umeclidin-Vilant (TRELEGY ELLIPTA) 200-62.5-25 MCG/ACT AEPB; Inhale 1 puff into the lungs daily.  Dispense: 1 each; Refill: 11 - CT CHEST WO CONTRAST; Future - AMB referral to pulmonary rehabilitation - Pulmonary Function Test [facility] Only; Future - DG Chest 2 View; Future - ipratropium-albuterol (DUONEB) 0.5-2.5 (3) MG/3ML SOLN; Take 3 mLs by nebulization every 4 (four) hours as needed.  Dispense: 300 mL; Refill: 11 - Continue Oxygen supplementation - Discontinue prednisone 10 mg daily   Return in about 3 months (around 05/27/2022). Patient would like to switch her care and will follow up with me.  I spent 45 minutes caring for this patient today, including preparing to see the patient, obtaining and/or reviewing separately obtained history, performing a medically appropriate examination and/or evaluation, counseling and educating the patient/family/caregiver, ordering medications,  tests, or procedures, and documenting clinical information in the electronic health record  [name] Reichert, MD [facility] 02/24/2022 3:59 PM    End of visit medications:  Meds ordered this encounter  Medications   Fluticasone-Umeclidin-Vilant (TRELEGY ELLIPTA) 200-62.5-25 MCG/ACT AEPB    Sig: Inhale 1 puff into the lungs daily.    Dispense:  1 each    Refill:  11   ipratropium-albuterol (DUONEB) 0.5-2.5 (3) MG/3ML SOLN    Sig: Take 3 mLs by nebulization every 4 (four) hours as needed.    Dispense:  300 mL    Refill:  11     Current Outpatient Medications:    albuterol (PROVENTIL) (2.5 MG/3ML) 0.083% nebulizer solution, USE ONE VIAL IN NEBULIZER DAILY AS NEEDED FOR RESCUE, Disp: 120 mL, Rfl: 6   albuterol (VENTOLIN HFA) 108 (90 Base) MCG/ACT inhaler, INHALE 2 PUFFS EVERY 4 HOURS AS NEEDED, Disp: 8.5 g, Rfl: 5   alendronate (FOSAMAX) 70 MG tablet, TAKE 1 TABLET EVERY 7 DAYS WITH A FULL GLASS OF WATER ON AN EMPTY STOMACH DO NOT LIE DOWN FOR AT LEAST 30 MIN, Disp: 12 tablet, Rfl: 3   buPROPion (WELLBUTRIN SR) 150 MG 12 hr tablet, TAKE ONE TABLET BY MOUTH TWICE DAILY, Disp: 180 tablet, Rfl: 0   conjugated estrogens (PREMARIN) vaginal cream, Place vaginally daily. Apply 0.5mg  (pea-sized amount)  just inside the vaginal introitus with a finger-tip on  Monday, Wednesday and Friday nights., Disp: 30 g, Rfl: 0   CRANBERRY PO, Take 2 tablets by mouth every morning. , Disp: , Rfl:    ezetimibe (ZETIA) 10 MG tablet, TAKE 1 TABLET BY MOUTH DAILY, Disp: 90 tablet,  Rfl: 3   fluticasone (FLONASE) 50 MCG/ACT nasal spray, Place 2 sprays into both nostrils daily., Disp: 48 g, Rfl: 3   Fluticasone-Umeclidin-Vilant (TRELEGY ELLIPTA) 200-62.5-25 MCG/ACT AEPB, Inhale 1 puff into the lungs daily., Disp: 1 each, Rfl: 11   hydrOXYzine (ATARAX) 10 MG tablet, Take 1 tablet (10 mg total) by mouth daily as needed for anxiety., Disp: 20 tablet, Rfl: 0   ipratropium-albuterol (DUONEB) 0.5-2.5 (3)  MG/3ML SOLN, Take 3 mLs by nebulization every 4 (four) hours as needed., Disp: 300 mL, Rfl: 11   loratadine (CLARITIN) 10 MG tablet, Take 1 tablet (10 mg total) by mouth daily as needed for allergies., Disp: 90 tablet, Rfl: 3   losartan (COZAAR) 100 MG tablet, TAKE 1 TABLET BY MOUTH DAILY, Disp: 90 tablet, Rfl: 1   metoprolol tartrate (LOPRESSOR) 25 MG tablet, TAKE ONE TABLET BY MOUTH TWICE DAILY, Disp: 180 tablet, Rfl: 3   mirtazapine (REMERON) 15 MG tablet, TAKE 1 TABLET BY MOUTH AT BEDTIME, Disp: 30 tablet, Rfl: 4   Multiple Vitamins-Minerals (PRESERVISION AREDS 2) CAPS, Take 2 capsules by mouth daily., Disp: , Rfl:    pravastatin (PRAVACHOL) 80 MG tablet, TAKE ONE TABLET BY MOUTH AT BEDTIME, Disp: 90 tablet, Rfl: 3   predniSONE (DELTASONE) 10 MG tablet, TAKE 1 TABLET BY MOUTH DAILY WITH BREAKFAST, Disp: 30 tablet, Rfl: 1   sulfamethoxazole-trimethoprim (BACTRIM DS) 800-160 MG tablet, Take 1 tablet by mouth 2 (two) times daily., Disp: 10 tablet, Rfl: 0   Subjective:   [name] GENDER: female DOB: [birth year], MRN: [id number]  Chief Complaint  Patient presents with   Follow-up    COPD. SOB after a meal or with walking. Some wheezing.     HPI  [name] [name] is a pelsant 81 year old female patient of Dr. Mortimer [name] followed by him for COPD. She is presenting today for acute visit due to increased shortness of breath.  She reports having had a diagnosis of COPD for many years, with previous records noting an FEV1 of 0.82L (30% predicted). She has been maintained on a combination of inhalers and nebulizers (Spiriva handihaler, formoterol nebulizer, and budesonide nebulizer) for many years (she is unable to give me a clear number). She reports shortness of breath on exertion and at rest, and is very limited in her activity. She was given prednisone over the spring and into the fall, and has recently ran out. She was maintained on 10 mg daily for over 6 months. She reports a cough  associated with a wheeze that comes in bouts. She has been using her albuterol inhaler multiple times a day.  Ancillary information including prior medications, full medical/surgical/family/social histories, and PFTs (when available) are listed below and have been reviewed.   Review of Systems  Constitutional:  Negative for chills, fever and weight loss.  Respiratory:  Positive for cough, sputum production, shortness of breath and wheezing. Negative for hemoptysis.   Cardiovascular:  Negative for chest pain and palpitations.     Objective:   Vitals:   02/24/22 1450  BP: 134/72  Pulse: 65  SpO2: 93%  Weight: 134 lb (60.8 kg)  Height: 5\' 4"  (1.626 m)   93% on RA BMI Readings from Last 3 Encounters:  02/24/22 23.00 kg/m  02/09/22 24.51 kg/m  10/13/21 23.77 kg/m   Wt Readings from Last 3 Encounters:  02/24/22 134 lb (60.8 kg)  02/09/22 138 lb 6 oz (62.8 kg)  10/13/21 134 lb 3.2 oz (60.9 kg)    Physical  Exam Constitutional:      General: She is not in acute distress.    Appearance: She is ill-appearing.  HENT:     Nose: Nose normal.     Comments: Nasal cannula in place    Mouth/Throat:     Mouth: Mucous membranes are moist.  Cardiovascular:     Rate and Rhythm: Normal rate and regular rhythm.     Heart sounds: Normal heart sounds.  Pulmonary:     Breath sounds: Wheezing present.     Comments: Distant breath sounds Abdominal:     General: Abdomen is flat.     Palpations: Abdomen is soft.  Musculoskeletal:     Cervical back: Normal range of motion.  Neurological:     General: No focal deficit present.     Mental Status: She is alert and oriented to person, place, and time.       Ancillary Information    Past Medical History:  Diagnosis Date   Chronic cystitis    recurrent UTI (4+ in last year), on suppressive doxy (uro Cope)   Colon polyp    COPD, severe 01/27/07       Depression with anxiety    Diverticular disease 10/09/09   CT abd no sigmoid  mass DDD L5/S1   Dyslipidemia    GERD (gastroesophageal reflux disease)    Hematuria    h/o kidney stones   Hiatal hernia    Hypertension    Left scapholunate ligament tear 2014   found on imaging   Lung cancer ([place]) 1987   Partial RLL Lobectomy.   Osteoarthritis    Osteopenia 08/2011   DEXA: L femur -1.6   Peptic ulcer    Spinal abscess ([place]) 2000   secondary to periodontal disease     Family History  Problem Relation Age of Onset   Pancreatitis Mother    Heart disease Father    Hypertension Father    Emphysema Father    Arthritis Brother    Hypertension Brother    Cancer Maternal Aunt        ovarian cancer   Kidney disease Neg Hx    Prostate cancer Neg Hx    Bladder Cancer Neg Hx      Past Surgical History:  Procedure Laterality Date   BLADDER SURGERY     BREAST BIOPSY Left 03/99   neg   CHOLECYSTECTOMY  02/10/09   COLONOSCOPY WITH PROPOFOL N/A 04/30/2014   Procedure: COLONOSCOPY WITH PROPOFOL;  Surgeon: Irene Shipper, MD;  Location: [facility];  Service: Endoscopy;  Laterality: N/A;   CYSTOSCOPY  02/11/07 and 09/25/08   Dr. Jacqlyn Larsen, normal   CYSTOSCOPY  07/2019   [facility] Erlene Quan)   DEXA  08/2011   T femur -1.6, T spine -0.6   MOUTH SURGERY  2000   [facility] pneumonia sepsis due to gum surgery   OOPHORECTOMY     REPLACEMENT TOTAL KNEE  09/2009   Right   TOTAL ABDOMINAL HYSTERECTOMY  07/18/01   benign reason, BSO, vag repair secondary prolapse   TUBAL LIGATION     bilateral    Social History   Socioeconomic History   Marital status: Widowed    Spouse name: Not on file   Number of children: 2   Years of education: Not on file   Highest education level: Not on file  Occupational History   Occupation: Retired    Fish farm manager: RETIRED    Comment: prior Glass blower/designer at [place] Use  Smoking status: Former    Packs/day: 3.00    Years: 34.00    Total pack years: 102.00    Types: Cigarettes    Quit date: 05/29/1995    Years since  quitting: 26.7   Smokeless tobacco: Never  Vaping Use   Vaping Use: Never used  Substance and Sexual Activity   Alcohol use: Yes    Alcohol/week: 0.0 standard drinks of alcohol    Comment: wine occasionally   Drug use: No   Sexual activity: Not Currently  Other Topics Concern   Not on file  Social History Narrative   Caffeine: 4 cups coffee   Lives in independent living at [facility] for the third time, 2nd husband died of heart attack. Two stepsons. 3rd husband died summer 2018   Mother of [name]   Activity: Walks for exercise   Diet: good water, fruits/vegetables daily      Advanced directed - would want HCPOA to be husband, Karlton Lemon. Does not have set up - requests handout.   Social Determinants of Health   Financial Resource Strain: Low Risk  (06/11/2021)   Overall Financial Resource Strain (CARDIA)    Difficulty of Paying Living Expenses: Not hard at all  Food Insecurity: No Food Insecurity (06/11/2021)   Hunger Vital Sign    Worried About Running Out of Food in the Last Year: Never true    Ran Out of Food in the Last Year: Never true  Transportation Needs: No Transportation Needs (06/11/2021)   PRAPARE - Hydrologist (Medical): No    Lack of Transportation (Non-Medical): No  Physical Activity: Inactive (06/11/2021)   Exercise Vital Sign    Days of Exercise per Week: 0 days    Minutes of Exercise per Session: 0 min  Stress: No Stress Concern Present (06/11/2021)   [place]    Feeling of Stress : Not at all  Social Connections: Moderately Isolated (06/11/2021)   Social Connection and Isolation Panel [NHANES]    Frequency of Communication with Friends and Family: More than three times a week    Frequency of Social Gatherings with Friends and Family: More than three times a week    Attends Religious Services: More than 4 times per year    Active Member of [employer]  or Organizations: No    Attends Archivist Meetings: Never    Marital Status: Widowed  Intimate Partner Violence: Not At Risk (06/11/2021)   Humiliation, Afraid, Rape, and Kick questionnaire    Fear of Current or Ex-Partner: No    Emotionally Abused: No    Physically Abused: No    Sexually Abused: No     Allergies  Allergen Reactions   Keflex [Cephalexin] Shortness Of Breath   Sulbactam Itching   Penicillins     REACTION: hives     CBC    Component Value Date/Time   WBC 9.6 07/29/2021 1046   RBC 3.88 07/29/2021 1046   HGB 12.4 07/29/2021 1046   HGB 14.4 07/24/2012 1313   HCT 38.0 07/29/2021 1046   HCT 43.3 07/24/2012 1313   PLT 182.0 07/29/2021 1046   PLT 146 (L) 07/24/2012 1313   MCV 97.9 07/29/2021 1046   MCV 93 07/24/2012 1313   MCH 31.8 01/24/2020 1805   MCHC 32.6 07/29/2021 1046   RDW 12.7 07/29/2021 1046   RDW 13.1 07/24/2012 1313   LYMPHSABS 3.5 07/29/2021 1046   MONOABS  0.9 07/29/2021 1046   EOSABS 0.2 07/29/2021 1046   BASOSABS 0.0 07/29/2021 1046    Pulmonary Functions Testing Results:     No data to display          Outpatient Medications Prior to Visit  Medication Sig Dispense Refill   albuterol (PROVENTIL) (2.5 MG/3ML) 0.083% nebulizer solution USE ONE VIAL IN NEBULIZER DAILY AS NEEDED FOR RESCUE 120 mL 6   albuterol (VENTOLIN HFA) 108 (90 Base) MCG/ACT inhaler INHALE 2 PUFFS EVERY 4 HOURS AS NEEDED 8.5 g 5   alendronate (FOSAMAX) 70 MG tablet TAKE 1 TABLET EVERY 7 DAYS WITH A FULL GLASS OF WATER ON AN EMPTY STOMACH DO NOT LIE DOWN FOR AT LEAST 30 MIN 12 tablet 3   buPROPion (WELLBUTRIN SR) 150 MG 12 hr tablet TAKE ONE TABLET BY MOUTH TWICE DAILY 180 tablet 0   conjugated estrogens (PREMARIN) vaginal cream Place vaginally daily. Apply 0.5mg  (pea-sized amount)  just inside the vaginal introitus with a finger-tip on  Monday, Wednesday and Friday nights. 30 g 0   CRANBERRY PO Take 2 tablets by mouth every morning.      ezetimibe (ZETIA) 10  MG tablet TAKE 1 TABLET BY MOUTH DAILY 90 tablet 3   fluticasone (FLONASE) 50 MCG/ACT nasal spray Place 2 sprays into both nostrils daily. 48 g 3   hydrOXYzine (ATARAX) 10 MG tablet Take 1 tablet (10 mg total) by mouth daily as needed for anxiety. 20 tablet 0   loratadine (CLARITIN) 10 MG tablet Take 1 tablet (10 mg total) by mouth daily as needed for allergies. 90 tablet 3   losartan (COZAAR) 100 MG tablet TAKE 1 TABLET BY MOUTH DAILY 90 tablet 1   metoprolol tartrate (LOPRESSOR) 25 MG tablet TAKE ONE TABLET BY MOUTH TWICE DAILY 180 tablet 3   mirtazapine (REMERON) 15 MG tablet TAKE 1 TABLET BY MOUTH AT BEDTIME 30 tablet 4   Multiple Vitamins-Minerals (PRESERVISION AREDS 2) CAPS Take 2 capsules by mouth daily.     pravastatin (PRAVACHOL) 80 MG tablet TAKE ONE TABLET BY MOUTH AT BEDTIME 90 tablet 3   predniSONE (DELTASONE) 10 MG tablet TAKE 1 TABLET BY MOUTH DAILY WITH BREAKFAST 30 tablet 1   sulfamethoxazole-trimethoprim (BACTRIM DS) 800-160 MG tablet Take 1 tablet by mouth 2 (two) times daily. 10 tablet 0   budesonide (PULMICORT) 0.5 MG/2ML nebulizer solution INHALE 1 VIAL VIA NEBULIZER TWICE DAILY 120 mL 2   formoterol (PERFOROMIST) 20 MCG/2ML nebulizer solution Take 2 mLs (20 mcg total) by nebulization 2 (two) times daily. 120 mL 11   tiotropium (SPIRIVA) 18 MCG inhalation capsule Place 1 capsule (18 mcg total) into inhaler and inhale daily. 90 capsule 3   No facility-administered medications prior to visit.

## 2022-02-24 NOTE — Patient Instructions (Addendum)
Today, we discussed your breathing treatments. I will stop your budesonide, spiriva, and aformoterol. I will start you on Trelegy - take one puff once a day. I will also add duo-neb nebulizers to be used every 4 hours as needed.  I have ordered a CT scan of the chest as well as a pulmonary function test to evaluate your breathing. I will assess the degree of emphysema on your CT scan.

## 2022-03-01 ENCOUNTER — Ambulatory Visit
Admission: RE | Admit: 2022-03-01 | Discharge: 2022-03-01 | Disposition: A | Discharge status: Home / Self Care | Payer: PPO | Source: Ambulatory Visit | Attending: Student in an Organized Health Care Education/Training Program | Admitting: Student in an Organized Health Care Education/Training Program

## 2022-03-01 DIAGNOSIS — J432 Centrilobular emphysema: Secondary | ICD-10-CM | POA: Insufficient documentation

## 2022-03-01 DIAGNOSIS — J439 Emphysema, unspecified: Secondary | ICD-10-CM | POA: Diagnosis not present

## 2022-03-01 DIAGNOSIS — E278 Other specified disorders of adrenal gland: Secondary | ICD-10-CM | POA: Diagnosis not present

## 2022-04-07 ENCOUNTER — Other Ambulatory Visit: Payer: Self-pay | Admitting: Family Medicine

## 2022-04-07 DIAGNOSIS — F331 Major depressive disorder, recurrent, moderate: Secondary | ICD-10-CM

## 2022-04-07 NOTE — Telephone Encounter (Signed)
E- scribed refill.  Pt scheduled for [facility] wellness on 06/14/22.  Plz schedule CPE and lab visits after  06/14/22 to prevent delays in future refills.

## 2022-04-10 ENCOUNTER — Other Ambulatory Visit: Payer: Self-pay | Admitting: Family Medicine

## 2022-04-15 ENCOUNTER — Telehealth: Payer: Self-pay | Admitting: Student in an Organized Health Care Education/Training Program

## 2022-04-15 NOTE — Telephone Encounter (Signed)
Entered in error

## 2022-05-14 ENCOUNTER — Ambulatory Visit: Payer: PPO | Attending: Student in an Organized Health Care Education/Training Program

## 2022-05-14 DIAGNOSIS — J432 Centrilobular emphysema: Secondary | ICD-10-CM | POA: Diagnosis not present

## 2022-05-14 DIAGNOSIS — Z87891 Personal history of nicotine dependence: Secondary | ICD-10-CM | POA: Diagnosis not present

## 2022-05-14 LAB — PULMONARY FUNCTION TEST ARMC ONLY
DL/VA % pred: 46 %
DL/VA: 1.87 ml/min/mmHg/L
DLCO unc % pred: 28 %
DLCO unc: 5.25 ml/min/mmHg
FEF 25-75 Post: 0.15 L/sec
FEF 25-75 Pre: 0.14 L/sec
FEF2575-%Change-Post: 8 %
FEF2575-%Pred-Post: 11 %
FEF2575-%Pred-Pre: 10 %
FEV1-%Change-Post: 4 %
FEV1-%Pred-Post: 19 %
FEV1-%Pred-Pre: 18 %
FEV1-Post: 0.36 L
FEV1-Pre: 0.35 L
FEV1FVC-%Change-Post: 14 %
FEV1FVC-%Pred-Pre: 42 %
FEV6-%Change-Post: 0 %
FEV6-%Pred-Post: 40 %
FEV6-%Pred-Pre: 40 %
FEV6-Post: 0.98 L
FEV6-Pre: 0.98 L
FEV6FVC-%Change-Post: 10 %
FEV6FVC-%Pred-Post: 101 %
FEV6FVC-%Pred-Pre: 91 %
FVC-%Change-Post: -9 %
FVC-%Pred-Post: 39 %
FVC-%Pred-Pre: 43 %
FVC-Post: 1.02 L
FVC-Pre: 1.13 L
Post FEV1/FVC ratio: 36 %
Post FEV6/FVC ratio: 96 %
Pre FEV1/FVC ratio: 31 %
Pre FEV6/FVC Ratio: 87 %
RV % pred: 117 %
RV: 2.84 L
TLC % pred: 83 %
TLC: 4.23 L

## 2022-05-18 DIAGNOSIS — M545 Low back pain, unspecified: Secondary | ICD-10-CM | POA: Diagnosis not present

## 2022-05-18 DIAGNOSIS — M4856XA Collapsed vertebra, not elsewhere classified, lumbar region, initial encounter for fracture: Secondary | ICD-10-CM | POA: Diagnosis not present

## 2022-05-18 DIAGNOSIS — M47896 Other spondylosis, lumbar region: Secondary | ICD-10-CM | POA: Diagnosis not present

## 2022-05-19 DIAGNOSIS — M545 Low back pain, unspecified: Secondary | ICD-10-CM | POA: Diagnosis not present

## 2022-05-21 ENCOUNTER — Other Ambulatory Visit: Payer: Self-pay

## 2022-05-21 ENCOUNTER — Emergency Department
Admission: EM | Admit: 2022-05-21 | Discharge: 2022-05-21 | Disposition: A | Discharge status: Home / Self Care | Payer: PPO | Attending: Emergency Medicine | Admitting: Emergency Medicine

## 2022-05-21 ENCOUNTER — Emergency Department: Payer: PPO

## 2022-05-21 ENCOUNTER — Encounter: Payer: Self-pay | Admitting: Emergency Medicine

## 2022-05-21 DIAGNOSIS — J439 Emphysema, unspecified: Secondary | ICD-10-CM | POA: Diagnosis not present

## 2022-05-21 DIAGNOSIS — S3992XA Unspecified injury of lower back, initial encounter: Secondary | ICD-10-CM | POA: Diagnosis present

## 2022-05-21 DIAGNOSIS — J9611 Chronic respiratory failure with hypoxia: Secondary | ICD-10-CM | POA: Insufficient documentation

## 2022-05-21 DIAGNOSIS — R000 Tachycardia, unspecified: Secondary | ICD-10-CM | POA: Diagnosis not present

## 2022-05-21 DIAGNOSIS — X58XXXA Exposure to other specified factors, initial encounter: Secondary | ICD-10-CM | POA: Diagnosis not present

## 2022-05-21 DIAGNOSIS — Z20822 Contact with and (suspected) exposure to covid-19: Secondary | ICD-10-CM | POA: Diagnosis not present

## 2022-05-21 DIAGNOSIS — J441 Chronic obstructive pulmonary disease with (acute) exacerbation: Secondary | ICD-10-CM | POA: Diagnosis not present

## 2022-05-21 DIAGNOSIS — R069 Unspecified abnormalities of breathing: Secondary | ICD-10-CM | POA: Diagnosis not present

## 2022-05-21 DIAGNOSIS — F410 Panic disorder [episodic paroxysmal anxiety] without agoraphobia: Secondary | ICD-10-CM | POA: Diagnosis not present

## 2022-05-21 DIAGNOSIS — W19XXXA Unspecified fall, initial encounter: Secondary | ICD-10-CM | POA: Diagnosis not present

## 2022-05-21 DIAGNOSIS — S32010A Wedge compression fracture of first lumbar vertebra, initial encounter for closed fracture: Secondary | ICD-10-CM | POA: Diagnosis not present

## 2022-05-21 DIAGNOSIS — J449 Chronic obstructive pulmonary disease, unspecified: Secondary | ICD-10-CM | POA: Diagnosis not present

## 2022-05-21 DIAGNOSIS — J80 Acute respiratory distress syndrome: Secondary | ICD-10-CM | POA: Diagnosis not present

## 2022-05-21 DIAGNOSIS — S32019A Unspecified fracture of first lumbar vertebra, initial encounter for closed fracture: Secondary | ICD-10-CM | POA: Insufficient documentation

## 2022-05-21 DIAGNOSIS — J9621 Acute and chronic respiratory failure with hypoxia: Secondary | ICD-10-CM | POA: Diagnosis not present

## 2022-05-21 DIAGNOSIS — R0602 Shortness of breath: Secondary | ICD-10-CM | POA: Diagnosis not present

## 2022-05-21 LAB — BASIC METABOLIC PANEL
Anion gap: 8 (ref 5–15)
BUN: 12 mg/dL (ref 8–23)
CO2: 36 mmol/L — ABNORMAL HIGH (ref 22–32)
Calcium: 9.3 mg/dL (ref 8.9–10.3)
Chloride: 97 mmol/L — ABNORMAL LOW (ref 98–111)
Creatinine, Ser: 0.74 mg/dL (ref 0.44–1.00)
GFR, Estimated: >60 mL/min (ref >60)
Glucose, Bld: 119 mg/dL — ABNORMAL HIGH (ref 70–99)
Potassium: 4.2 mmol/L (ref 3.5–5.1)
Sodium: 141 mmol/L (ref 135–145)

## 2022-05-21 LAB — RESP PANEL BY RT-PCR (RSV, FLU A&B, COVID)  RVPGX2
Influenza A by PCR: NEGATIVE
Influenza B by PCR: NEGATIVE
Resp Syncytial Virus by PCR: NEGATIVE
SARS Coronavirus 2 by RT PCR: NEGATIVE

## 2022-05-21 LAB — CBC
HCT: 39.1 % (ref 36.0–46.0)
Hemoglobin: 12 g/dL (ref 12.0–15.0)
MCH: 30.3 pg (ref 26.0–34.0)
MCHC: 30.7 g/dL (ref 30.0–36.0)
MCV: 98.7 fL (ref 80.0–100.0)
Platelets: 160 10*3/uL (ref 150–400)
RBC: 3.96 MIL/uL (ref 3.87–5.11)
RDW: 11.5 % (ref 11.5–15.5)
WBC: 7.8 10*3/uL (ref 4.0–10.5)
nRBC: 0 % (ref 0.0–0.2)

## 2022-05-21 MED ORDER — ACETAMINOPHEN 325 MG PO TABS
ORAL_TABLET | ORAL | Status: AC
  Filled 2022-05-21: qty 2

## 2022-05-21 MED ORDER — ALBUTEROL SULFATE HFA 108 (90 BASE) MCG/ACT IN AERS
2.0000 | INHALATION_SPRAY | RESPIRATORY_TRACT | Status: DC | PRN
  Filled 2022-05-21: qty 6.7

## 2022-05-21 NOTE — ED Triage Notes (Signed)
Pt comes via [facility] from home with c/o breathing difficulty. Pt did have wheezing in upper and lower. Pt was given albuterol treatment. Pt was at 76% at 4L and placed on 6L with breathing treatment at 100%. Pt does wear 4l all the time.   Pt did try to complete her own treatment but unable to.

## 2022-05-21 NOTE — ED Provider Notes (Signed)
[facility] Provider Note    Event Date/Time   First MD Initiated Contact with Patient 05/21/22 1218     (approximate)   History   Chief Complaint: Shortness of Breath   HPI  [name] [name] is a 81 y.o. female with a history of anxiety, COPD on 4 L nasal cannula oxygen at home, GERD who comes to the ED today due to an episode of shortness of breath at home.  Son at bedside notes that patient has frequent transient episodes of desaturation with oxygen reading in the 70s at home, which tend to happen whenever she gets anxious and starts to breathe rapidly as happened this morning when getting up.  She is having severe low back pain due to a recently diagnosed L1 compression fracture.  She had an MRI yesterday and was planning to follow-up with orthopedics this morning to talk about the results.  They did previously order a back brace for her, which she has not yet obtained.  Currently she is feeling back to normal, asymptomatic.  Denies chest pain or shortness of breath.  Denies recent illness.     Physical Exam   Triage Vital Signs: ED Triage Vitals  Enc Vitals Group     BP 05/21/22 0929 (!) 155/86     Pulse Rate 05/21/22 0929 (!) 105     Resp 05/21/22 0929 16     Temp 05/21/22 0929 98.3 F (36.8 C)     Temp Source 05/21/22 0929 Oral     SpO2 05/21/22 0929 98 %     Weight 05/21/22 1210 134 lb 0.6 oz (60.8 kg)     Height 05/21/22 1210 5\' 4"  (1.626 m)     Head Circumference --      Peak Flow --      Pain Score 05/21/22 0914 4     Pain Loc --      Pain Edu? --      Excl. in [place]? --     Most recent vital signs: Vitals:   05/21/22 1230 05/21/22 1300  BP: (!) 175/73 (!) 190/74  Pulse: 86 83  Resp:  17  Temp:    SpO2: 100% 100%    General: Awake, no distress.  CV:  Good peripheral perfusion.  Regular rate and rhythm Resp:  Normal effort.  Diffusely diminished breath sounds compatible with emphysema.  Symmetric air movement.  No crackles or  wheezing. Abd:  No distention.  Soft nontender Other:  No lower extremity edema.  Moist oral mucosa.  Nontoxic   ED Results / Procedures / Treatments   Labs (all labs ordered are listed, but only abnormal results are displayed) Labs Reviewed  BASIC METABOLIC PANEL - Abnormal; Notable for the following components:      Result Value   Chloride 97 (*)    CO2 36 (*)    Glucose, Bld 119 (*)    All other components within normal limits  RESP PANEL BY RT-PCR (RSV, FLU A&B, COVID)  [id number]  CBC     EKG Interpreted by me Sinus tachycardia rate 105.  Normal axis, normal intervals.  Poor R wave progression.  Normal ST segments and T waves.  No ischemic changes, no evidence of right heart strain.   RADIOLOGY Interpreted by me Emphysema with clear lungs, no consolidation or effusion.  Radiology report reviewed   PROCEDURES:  Procedures   MEDICATIONS ORDERED IN ED: Medications  albuterol (VENTOLIN HFA) 108 (90 Base) MCG/ACT inhaler 2 puff (has  no administration in time range)     IMPRESSION / MDM / ASSESSMENT AND PLAN / ED COURSE  I reviewed the triage vital signs and the nursing notes.                              Differential diagnosis includes, but is not limited to, viral illness, pneumonia, pneumothorax, anemia, electrolyte abnormality, panic attack  Patient's presentation is most consistent with acute presentation with potential threat to life or bodily function.  Patient presents with episode of shortness of breath, now resolved.  Noncardiac. Considering the patient's symptoms, medical history, and physical examination today, I have low suspicion for ACS, PE, TAD,  carditis, mediastinitis, CHF, or sepsis.  Labs and chest x-ray are unremarkable.  Vital signs are okay.  Viral swab negative.  Patient requested we help her arrange home health aide and physical therapy, which case management has assisted with.  She does not require admission and can be discharged home with  family.        FINAL CLINICAL IMPRESSION(S) / ED DIAGNOSES   Final diagnoses:  Panic attack  Chronic respiratory failure with hypoxia (HCC)  Closed compression fracture of body of L1 vertebra ([place])     Rx / DC Orders   ED Discharge Orders     None        Note:  This document was prepared using Dragon voice recognition software and may include unintentional dictation errors.   Carrie Mew, MD 05/21/22 [phone]

## 2022-05-21 NOTE — ED Notes (Signed)
Pt used call bell to notify that her bedside monitor alarming; fixed; VSS/WDL; pt's tv turned off as requested; pt denies any other needs currently.

## 2022-05-21 NOTE — TOC Initial Note (Signed)
Transition of Care [facility]) - Initial/Assessment Note    Patient Details  Name: [name] [name] MRN: [id number] Date of Birth: Jun 24, 1940  [facility] of [facility]) CM/SW Contact:    Shelbie Hutching, RN Phone Number: 05/21/2022, 2:22 PM  Clinical Narrative:                 Patient came into the emergency room with shortness of breath, history of COPD on chronic oxygen with [employer].  Patient will be discharged home today, patient and son, Aaron Edelman agree to home health PT and aide with [facility].  They both verbalize that they understand home health agency will not be able to come out until next week.   Patient is from home alone, she fell last week and has a compression fracture in her lumbar spine.  Patient was supposed to see ortho today outpatient but had this breathing episode that brought her to the ED.  They will reschedule ortho appointment for as soon as possible.  Patient's son will also be reaching out to a home care agency for CNA and PCS that was recommended to them by other family and friends.   RNCM did provide patient with [place] Resources booklet.   Son will transport patient home.    Expected Discharge Plan: [place] Barriers to Discharge: Barriers Resolved   Patient Goals and CMS Choice Patient states their goals for this hospitalization and ongoing recovery are:: someone to help at home CMS Medicare.gov Compare Post Acute Care list provided to:: Patient Choice offered to / list presented to : Patient      Expected Discharge Plan and Services   Discharge Planning Services: CM Consult Post Acute Care Choice: [place] arrangements for the past 2 months: Single Family Home                 DME Arranged: N/A DME Agency: NA       HH Arranged: PT, Nurse's Aide HH Agency: [facility] Date [facility] Agency Contacted: 05/21/22 Time [place]: 72 Representative spoke with at [place]:  Tommi Rumps  Prior Living Arrangements/Services Living arrangements for the past 2 months: [place] with:: Self Patient language and need for interpreter reviewed:: Yes Do you feel safe going back to the place where you live?: Yes      Need for Family Participation in Patient Care: Yes (Comment) Care giver support system in place?: Yes (comment) Current home services: DME (oxygen, nebulizer) Criminal Activity/Legal Involvement Pertinent to Current Situation/Hospitalization: No - Comment as needed  Activities of Daily Living      Permission Sought/Granted Permission sought to share information with : Case Manager, Family Supports, Other (comment) Permission granted to share information with : Yes, Verbal Permission Granted  Share Information with NAME: [name]  Permission granted to share info w AGENCY: [facility]  Permission granted to share info w Relationship: son  Permission granted to share info w Contact Information: [phone]  Emotional Assessment Appearance:: Appears stated age Attitude/Demeanor/Rapport: Engaged Affect (typically observed): Accepting Orientation: : Oriented to Self, Oriented to Place, Oriented to  Time, Oriented to Situation Alcohol / Substance Use: Not Applicable Psych Involvement: No (comment)  Admission diagnosis:  Breathing Difficulty, EMS Patient Active Problem List   Diagnosis Date Noted   Right rib fracture 09/11/2021   Fall with injury 09/10/2021   Unsteadiness on feet 09/10/2021   Pedal edema 04/28/2021   Pain in metatarsus of left  foot 08/12/2020   Right shoulder pain 06/17/2020   Abdominal aortic atherosclerosis ([place]) 08/25/2019   Right iliac artery stenosis (HCC) 08/25/2019   GERD (gastroesophageal reflux disease) 04/03/2019   Chronic diarrhea 03/05/2019   PAD (peripheral artery disease) ([place]) 01/05/2019   Epidermal cyst 02/01/2018   Weight loss, unintentional 12/29/2017   Hoarseness, persistent 12/29/2017   Dysphagia  11/17/2017   Orthostatic syncope 09/21/2016   Trigeminal neuralgia of right side of face 11/18/2015   COPD exacerbation ([place]) 07/29/2015   CAD (coronary artery disease) 03/04/2015   Health maintenance examination 03/04/2015   MAI (mycobacterium avium-intracellulare) ([place]) 10/23/2014   Hx of colonic polyps 04/23/2014   Advanced care planning/counseling discussion 02/18/2014   Allergic rhinitis 02/05/2014   MRSA colonization 02/05/2014   Urge incontinence of urine 03/23/2013   Recurrent UTI 07/27/2012   Chronic respiratory failure with hypoxia ([place]) 04/27/2012   Medicare annual wellness visit, subsequent 09/10/2011   Dyslipidemia 09/01/2011   Osteopenia 08/23/2011   Chronic fatigue 06/19/2008   GOLD 4 COPD with emphysema 02/01/2008   ANXIETY 11/21/2006   MDD (major depressive disorder), recurrent episode, moderate ([place]) 11/21/2006   Essential hypertension 11/21/2006   Hyperglycemia 11/21/2006   PCP:  Ria Bush, MD Pharmacy:   [place], [place] [address] Phone: [phone] Fax: [phone]  [place], [place] - [address] [place] [address] Phone: [phone] Fax: [phone]  [place], [address] [place] [address] Phone: [phone] Fax: [phone]     Social Determinants of Health (SDOH) Social History: SDOH Screenings   Food Insecurity: No Food Insecurity (06/11/2021)  Housing: Low Risk  (06/11/2021)  Transportation Needs: No Transportation Needs (06/11/2021)  Alcohol Screen: Low Risk  (06/11/2021)  Depression (PHQ2-9): Low Risk  (06/11/2021)  Recent Concern: Depression (PHQ2-9) - Medium Risk (04/28/2021)  Financial Resource Strain: Low Risk  (06/11/2021)  Physical Activity: Inactive (06/11/2021)  Social Connections: Moderately Isolated (06/11/2021)  Stress:  No Stress Concern Present (06/11/2021)  Tobacco Use: Medium Risk (05/21/2022)   SDOH Interventions:     Readmission Risk Interventions     No data to display

## 2022-05-21 NOTE — Discharge Instructions (Addendum)
[place] set up with [facility].  [facility] Lemmings will call to schedule your initial visit next week.  If you have questions you can call their office in [place] at [phone].

## 2022-05-22 ENCOUNTER — Encounter: Payer: Self-pay | Admitting: Family Medicine

## 2022-05-25 ENCOUNTER — Telehealth: Payer: Self-pay | Admitting: Family Medicine

## 2022-05-25 NOTE — Telephone Encounter (Signed)
Given ok via [place].  Ok to do this. Thank you.

## 2022-05-25 NOTE — Telephone Encounter (Signed)
Lusby from [facility] called stating they received a referral for the pt on last Sat, 12/30 & omitted pt on Sun, 12/31. Overton Mam is asking if Dr. Darnell Level would be the hospice attending provider per pt's family request? Overton Mam stated she sent a secured detailed message also to [facility], regarding pt. Call back # [phone]

## 2022-05-25 NOTE — Telephone Encounter (Signed)
No further action needed at this time.

## 2022-05-28 DIAGNOSIS — M545 Low back pain, unspecified: Secondary | ICD-10-CM | POA: Diagnosis not present

## 2022-05-31 ENCOUNTER — Telehealth: Payer: Self-pay | Admitting: Family Medicine

## 2022-05-31 ENCOUNTER — Telehealth: Payer: Self-pay | Admitting: Student in an Organized Health Care Education/Training Program

## 2022-05-31 DIAGNOSIS — F331 Major depressive disorder, recurrent, moderate: Secondary | ICD-10-CM

## 2022-05-31 NOTE — Telephone Encounter (Signed)
[name] from [facility]: [name] [name] called stating there was some meds that wasn't covered through the [facility]: albuterol (VENTOLIN HFA) 108 (90 Base) MCG/ACT inhaler & Fluticasone-Umeclidin-Vilant (TRELEGY ELLIPTA) 200-62.5-25 MCG/ACT AEPB. [name] [name] stated she spoke to the family & they're fine with discontinuing the meds & starting the oral steroid along with ipratropium-albuterol (DUONEB) 0.5-2.5 (3) MG/3ML SOLN. Call back # [phone], secured.

## 2022-05-31 NOTE — Telephone Encounter (Signed)
I spoke with the patient's daughter (DPR). I told her Dr. Genia Harold will be out of the office until Thursday. I told her I will send him the message and left her know his response as soon as he returns.  Dr. Genia Harold please advise.

## 2022-06-01 NOTE — Telephone Encounter (Signed)
Left message on voicemail for [name] to call the office back.

## 2022-06-01 NOTE — Telephone Encounter (Signed)
Ok to stop albuterol inhaler, and in its place use duoneb nebulizer 4 hours PRN.  Regarding trelegy, this was started by pulmonology - recommend they touch base with Dr Genia Harold for plan for controller COPD med in place of Trelegy.

## 2022-06-01 NOTE — Addendum Note (Signed)
Addended by: Ria Bush on: 06/01/2022 12:07 PM   Modules accepted: Orders

## 2022-06-01 NOTE — Telephone Encounter (Signed)
I have notified [name].   Nothing further needed.

## 2022-06-02 NOTE — Telephone Encounter (Signed)
Called and spoke with [name] [name] and was advised that she is aware of Dr. Bosie Clos message. [name] [name] stated that she called back yesterday and the person that answered the phone read the response from Dr. Danise Mina. [name] [name] stated that she does not remember the name of the person that she talked to. [name] [name] stated that she has spoken with the Hospice doctor and everything has been taken care of.

## 2022-06-02 NOTE — Telephone Encounter (Signed)
[name] from [facility] called stating the family is also requesting a Appetite stimulant to increase the pt's appetite. [name] stated the [facility] does cover the meds, Remeron, if Dr. Darnell Level would like to prescribe it? Lavone Nian stated they were going to send in orders for the meds, Zofran to help with the nasea. Lavone Nian is requesting a call back @ [phone].

## 2022-06-03 ENCOUNTER — Ambulatory Visit: Payer: PPO | Admitting: Student in an Organized Health Care Education/Training Program

## 2022-06-03 ENCOUNTER — Encounter: Payer: PPO | Admitting: Pharmacist

## 2022-06-07 ENCOUNTER — Telehealth: Payer: PPO

## 2022-06-07 MED ORDER — MIRTAZAPINE 15 MG PO TABS: 15.0000 mg | ORAL_TABLET | Freq: Every day | ORAL | 0 refills | Status: DC

## 2022-06-07 MED ORDER — ONDANSETRON HCL 4 MG PO TABS: 4.0000 mg | ORAL_TABLET | Freq: Three times a day (TID) | ORAL | 0 refills | Status: DC | PRN

## 2022-06-07 NOTE — Addendum Note (Signed)
Addended by: Eustaquio Boyden on: 06/07/2022 07:46 AM   Modules accepted: Orders

## 2022-06-07 NOTE — Telephone Encounter (Signed)
Lvm asking [name] to call back. Need to relay Dr. Timoteo Expose message.   Also, I spoke with pt relaying Dr. Timoteo Expose message. Pt expresses her thanks.

## 2022-06-07 NOTE — Telephone Encounter (Addendum)
Remeron refilled to pharmacy - is that what they were requesting? Plz notify [name] and patient. Caution with sedation, take at night.  Zofran also sent PRN nausea.

## 2022-06-08 NOTE — Telephone Encounter (Signed)
Left another message on voicemail for [name] with [facility] to call the office back.

## 2022-06-11 NOTE — Telephone Encounter (Signed)
Lvm asking [name] [name] to call back. Need to relay Dr. Timoteo Expose message.   Also, I spoke with pt relaying Dr. Timoteo Expose message. Pt expresses her thanks.   Closing encounter due to several attempts to contact [name] [name] (of [facility]) with no response.

## 2022-06-12 ENCOUNTER — Other Ambulatory Visit: Payer: Self-pay | Admitting: Family Medicine

## 2022-06-14 ENCOUNTER — Ambulatory Visit (INDEPENDENT_AMBULATORY_CARE_PROVIDER_SITE_OTHER)

## 2022-06-14 ENCOUNTER — Telehealth: Payer: Self-pay | Admitting: Family Medicine

## 2022-06-14 ENCOUNTER — Telehealth: Payer: Self-pay

## 2022-06-14 VITALS — Ht 64.0 in | Wt 134.0 lb

## 2022-06-14 DIAGNOSIS — Z0279 Encounter for issue of other medical certificate: Secondary | ICD-10-CM

## 2022-06-14 DIAGNOSIS — Z0000 Encounter for general adult medical examination without abnormal findings: Secondary | ICD-10-CM | POA: Diagnosis not present

## 2022-06-14 MED ORDER — ALBUTEROL SULFATE HFA 108 (90 BASE) MCG/ACT IN AERS: 2.0000 | INHALATION_SPRAY | Freq: Four times a day (QID) | RESPIRATORY_TRACT | 2 refills | Status: DC | PRN

## 2022-06-14 NOTE — Progress Notes (Addendum)
Subjective:   [name] [name] is a 82 y.o. female who presents for Medicare Annual (Subsequent) preventive examination.  Review of Systems    No ROS.  Medicare Wellness Virtual Visit.  Visual/audio telehealth visit, UTA vital signs.   See social history for additional risk factors.   Cardiac Risk Factors include: advanced age (>25men, >71 women)     Objective:    Today's Vitals   06/14/22 1438  Weight: 134 lb (60.8 kg)  Height: 5\' 4"  (1.626 m)   Body mass index is 23 kg/m.     06/14/2022    2:42 PM 05/21/2022   12:09 PM 08/18/2021    9:11 AM 06/11/2021    2:49 PM 06/10/2020    2:33 PM 01/24/2020    6:00 PM 03/27/2019    2:47 PM  Advanced Directives  Does Patient Have a Medical Advance Directive? Yes No No Yes Yes Yes Yes  Type of 13/07/2018 of [place];Living will   Healthcare Power of [place];Living will Healthcare Power of [place];Living will Healthcare Power of [place];Living will Healthcare Power of [place];Living will  Does patient want to make changes to medical advance directive? No - Patient declined   Yes (MAU/Ambulatory/Procedural Areas - Information given)     Copy of Healthcare Power of Attorney in Chart? Yes - validated most recent copy scanned in chart (See row information)   Yes - validated most recent copy scanned in chart (See row information) Yes - validated most recent copy scanned in chart (See row information) No - copy requested No - copy requested  Would patient like information on creating a medical advance directive?  No - Patient declined No - Patient declined        Current Medications (verified) Outpatient Encounter Medications as of 06/14/2022  Medication Sig   alendronate (FOSAMAX) 70 MG tablet TAKE 1 TABLET EVERY 7 DAYS WITH A FULL GLASS OF WATER ON AN EMPTY STOMACH DO NOT LIE DOWN FOR AT LEAST 30 MIN   buPROPion (WELLBUTRIN SR) 150 MG 12 hr tablet TAKE ONE TABLET BY MOUTH TWICE DAILY   conjugated estrogens (PREMARIN)  vaginal cream Place vaginally daily. Apply 0.5mg  (pea-sized amount)  just inside the vaginal introitus with a finger-tip on  Monday, Wednesday and Friday nights.   CRANBERRY PO Take 2 tablets by mouth every morning.    ezetimibe (ZETIA) 10 MG tablet TAKE 1 TABLET BY MOUTH DAILY   fluticasone (FLONASE) 50 MCG/ACT nasal spray Place 2 sprays into both nostrils daily.   Fluticasone-Umeclidin-Vilant (TRELEGY ELLIPTA) 200-62.5-25 MCG/ACT AEPB Inhale 1 puff into the lungs daily.   hydrOXYzine (ATARAX) 10 MG tablet Take 1 tablet (10 mg total) by mouth daily as needed for anxiety.   ipratropium-albuterol (DUONEB) 0.5-2.5 (3) MG/3ML SOLN Take 3 mLs by nebulization every 4 (four) hours as needed.   loratadine (CLARITIN) 10 MG tablet Take 1 tablet (10 mg total) by mouth daily as needed for allergies.   losartan (COZAAR) 100 MG tablet TAKE 1 TABLET BY MOUTH DAILY   metoprolol tartrate (LOPRESSOR) 25 MG tablet TAKE ONE TABLET BY MOUTH TWICE DAILY   mirtazapine (REMERON) 15 MG tablet Take 1 tablet (15 mg total) by mouth at bedtime.   Multiple Vitamins-Minerals (PRESERVISION AREDS 2) CAPS Take 2 capsules by mouth daily.   ondansetron (ZOFRAN) 4 MG tablet Take 1 tablet (4 mg total) by mouth every 8 (eight) hours as needed for nausea or vomiting.   pravastatin (PRAVACHOL) 80 MG tablet TAKE ONE TABLET BY MOUTH  AT BEDTIME   predniSONE (DELTASONE) 10 MG tablet TAKE 1 TABLET BY MOUTH DAILY WITH BREAKFAST   sulfamethoxazole-trimethoprim (BACTRIM DS) 800-160 MG tablet Take 1 tablet by mouth 2 (two) times daily.   No facility-administered encounter medications on file as of 06/14/2022.    Allergies (verified) Keflex [cephalexin], Sulbactam, and Penicillins   History: Past Medical History:  Diagnosis Date   Chronic cystitis    recurrent UTI (4+ in last year), on suppressive doxy (uro Cope)   Colon polyp    COPD, severe 01/27/07       Depression with anxiety    Diverticular disease 10/09/09   CT abd no sigmoid  mass DDD L5/S1   Dyslipidemia    GERD (gastroesophageal reflux disease)    Hematuria    h/o kidney stones   Hiatal hernia    Hypertension    Left scapholunate ligament tear 2014   found on imaging   Lung cancer (HCC) 1987   Partial RLL Lobectomy.   Osteoarthritis    Osteopenia 08/2011   DEXA: L femur -1.6   Peptic ulcer    Spinal abscess (HCC) 2000   secondary to periodontal disease   Past Surgical History:  Procedure Laterality Date   BLADDER SURGERY     BREAST BIOPSY Left 03/99   neg   CHOLECYSTECTOMY  02/10/09   COLONOSCOPY WITH PROPOFOL N/A 04/30/2014   Procedure: COLONOSCOPY WITH PROPOFOL;  Surgeon: [name], MD;  Location: [facility];  Service: Endoscopy;  Laterality: N/A;   CYSTOSCOPY  02/11/07 and 09/25/08   Dr. Achilles Dunk, normal   CYSTOSCOPY  07/2019   WNL Apolinar Junes)   DEXA  08/2011   T femur -1.6, T spine -0.6   MOUTH SURGERY  2000   Hosp Klebiella pneumonia sepsis due to gum surgery   OOPHORECTOMY     REPLACEMENT TOTAL KNEE  09/2009   Right   TOTAL ABDOMINAL HYSTERECTOMY  07/18/01   benign reason, BSO, vag repair secondary prolapse   TUBAL LIGATION     bilateral   Family History  Problem Relation Age of Onset   Pancreatitis Mother    Heart disease Father    Hypertension Father    Emphysema Father    Arthritis Brother    Hypertension Brother    Cancer Maternal Aunt        ovarian cancer   Kidney disease Neg Hx    Prostate cancer Neg Hx    Bladder Cancer Neg Hx    Social History   Socioeconomic History   Marital status: Widowed    Spouse name: Not on file   Number of children: 2   Years of education: Not on file   Highest education level: Not on file  Occupational History   Occupation: Retired    Associate Professor: RETIRED    Comment: prior Print production planner at [employer]  Tobacco Use   Smoking status: Former    Packs/day: 3.00    Years: 34.00    Total pack years: 102.00    Types: Cigarettes    Quit date: 05/29/1995    Years since quitting:  27.0   Smokeless tobacco: Never  Vaping Use   Vaping Use: Never used  Substance and Sexual Activity   Alcohol use: Yes    Alcohol/week: 0.0 standard drinks of alcohol    Comment: wine occasionally   Drug use: No   Sexual activity: Not Currently  Other Topics Concern   Not on file  Social History Narrative   Caffeine: 4  cups coffee   Lives in independent living at [facility] the [facility] time, 2nd husband died of heart attack. Two stepsons. 3rd husband died summer 2018   Mother of Ander Slade   Activity: Walks for exercise   Diet: good water, fruits/vegetables daily      Advanced directed - would want HCPOA to be husband, Renae Gloss. Does not have set up - requests handout.   Social Determinants of Health   Financial Resource Strain: Low Risk  (06/14/2022)   Overall Financial Resource Strain (CARDIA)    Difficulty of Paying Living Expenses: Not hard at all  Food Insecurity: No Food Insecurity (06/14/2022)   Hunger Vital Sign    Worried About Running Out of Food in the Last Year: Never true    Ran Out of Food in the Last Year: Never true  Transportation Needs: No Transportation Needs (06/14/2022)   PRAPARE - Administrator, Civil Service (Medical): No    Lack of Transportation (Non-Medical): No  Physical Activity: Inactive (06/11/2021)   Exercise Vital Sign    Days of Exercise per Week: 0 days    Minutes of Exercise per Session: 0 min  Stress: No Stress Concern Present (06/14/2022)   [employer] of Occupational Health - Occupational Stress Questionnaire    Feeling of Stress : Not at all  Social Connections: Moderately Isolated (06/14/2022)   Social Connection and Isolation Panel [NHANES]    Frequency of Communication with Friends and Family: More than three times a week    Frequency of Social Gatherings with Friends and Family: More than three times a week    Attends Religious Services: More than 4 times per year    Active Member of [employer] or  Organizations: No    Attends Banker Meetings: Never    Marital Status: Widowed    Tobacco Counseling Counseling given: Not Answered   Clinical Intake:  Pre-visit preparation completed: Yes        Diabetes: No  How often do you need to have someone help you when you read instructions, pamphlets, or other written materials from your doctor or pharmacy?: 1 - Never    Interpreter Needed?: No      Activities of Daily Living    06/14/2022    2:36 PM 06/14/2022    2:32 PM  In your present state of health, do you have any difficulty performing the following activities:  Hearing? 0   Vision? 0   Difficulty concentrating or making decisions? 0   Walking or climbing stairs? 1   Comment Ambulates with walker   Dressing or bathing? 1   Doing errands, shopping? 1   Preparing Food and eating ? Y   Using the Toilet? N   In the past six months, have you accidently leaked urine? Y   Comment Managed with daily brief. Followed by Urology.   Do you have problems with loss of bowel control? N   Managing your Medications? Malvin Johns  Comment Family assist Son/daughter assist  Managing your Finances? Malvin Johns  Comment Family assist   Housekeeping or managing your Housekeeping? Y   Comment Family assist     Patient Care Team: Eustaquio Boyden, MD as PCP - General (Family Medicine) Mariah Milling Tollie Pizza, MD as Consulting Physician (Cardiology) Kathyrn Sheriff, [facility] as Pharmacist (Pharmacist)  Indicate any recent Medical Services you may have received from other than [facility] providers in the past year (date may be approximate).  Assessment:   This is a routine wellness examination for [place].  I connected with  Dejah Droessler Pasquini on 06/14/22 by a audio enabled telemedicine application and verified that I am speaking with the correct person using two identifiers. Information also received from son, HIPAA compliant.   Patient Location: Home  Provider Location:  Office/Clinic  I discussed the limitations of evaluation and management by telemedicine. The patient expressed understanding and agreed to proceed.   Hearing/Vision screen Hearing Screening - Comments:: Patient is able to hear conversational tones without difficulty.  No issues reported.   Vision Screening - Comments:: Last exam over a year ago, Dr. Raelyn Ensign, wears glasses Agrees to schedule  Dietary issues and exercise activities discussed: Current Exercise Habits: The patient does not participate in regular exercise at present Healthy diet Ensure daily as needed Good water intake   Goals Addressed               This Visit's Progress     Patient Stated     Healthy lifestyle (pt-stated)        Walk more for exercise       Depression Screen    06/14/2022    2:40 PM 06/11/2021    2:53 PM 04/28/2021    3:49 PM 06/10/2020    2:33 PM 03/27/2019    2:48 PM 03/16/2018    3:45 PM 08/12/2016    2:21 PM  PHQ 2/9 Scores  PHQ - 2 Score 0 0 4 2 0 0 0  PHQ- 9 Score   14 3 0 5     Fall Risk    06/14/2022    2:42 PM 06/11/2021    2:50 PM 06/10/2020    2:33 PM 04/16/2019    4:48 PM 03/27/2019    2:48 PM  Fall Risk   Falls in the past year? 1 0 0 1 0  Comment    Emmi Telephone Survey: data to providers prior to load   Number falls in past yr: 0 0 0 1 0  Comment    Emmi Telephone Survey Actual Response = 1   Injury with Fall?  0 0 1 0  Risk for fall due to :  No Fall Risks Medication side effect  Medication side effect  Follow up Falls evaluation completed;Falls prevention discussed Falls prevention discussed Falls evaluation completed;Falls prevention discussed  Falls evaluation completed;Falls prevention discussed    FALL RISK PREVENTION PERTAINING TO THE HOME: Home free of loose throw rugs in walkways, pet beds, electrical cords, etc? Yes  Adequate lighting in your home to reduce risk of falls? Yes   ASSISTIVE DEVICES UTILIZED TO PREVENT FALLS: Life alert? Yes  Use of a  cane, walker or w/c? Yes  Grab bars in the bathroom? Yes  Shower chair or bench in shower? Yes  Elevated toilet seat or a handicapped toilet? Yes   TIMED UP AND GO: Was the test performed? No .   Cognitive Function:    06/10/2020    2:37 PM 03/27/2019    2:51 PM 03/16/2018    3:49 PM 08/12/2016    2:21 PM  MMSE - Mini Mental State Exam  Orientation to time 4 5 5 5   Orientation to Place 5 5 5 5   Registration 3 3 3 3   Attention/ Calculation 5 5 0 0  Recall 3 3 3 2   Recall-comments    pt was unable to recall 1 of 3 words  Language- name 2 objects   0 0  Language- repeat 1 1 1 1   Language- follow 3 step command   3 3  Language- read & follow direction   0 0  Write a sentence   0 0  Copy design   0 0  Total score   20 19        06/14/2022    2:45 PM  6CIT Screen  What Year? 0 points  What month? 0 points  What time? 0 points  Count back from 20 0 points  Months in reverse 0 points  Repeat phrase 0 points  Total Score 0 points    Immunizations Immunization History  Administered Date(s) Administered   Fluad Quad(high Dose 65+) 03/05/2019, 02/09/2022   Influenza Split 03/27/2012   Influenza Whole 02/21/2006, 02/21/2008, 02/22/2011   Influenza, High Dose Seasonal PF 03/05/2021   Influenza,inj,Quad PF,6+ Mos 02/16/2013, 02/18/2014, 03/04/2015, 01/21/2016, 06/15/2016, 02/01/2018   Influenza-Unspecified 04/10/2020   Moderna Sars-Covid-2 Vaccination 07/10/2019, 08/07/2019, 04/10/2020   PNEUMOCOCCAL CONJUGATE-20 10/13/2021   Pfizer Covid-19 Vaccine Bivalent Booster 50yrs & up 03/05/2021   Pneumococcal Conjugate-13 02/18/2014   Pneumococcal Polysaccharide-23 02/10/2009   Td 05/25/1998, 09/18/2008   Zoster Recombinat (Shingrix) 04/03/2019   Zoster, Live 11/13/2012   TDAP status: Due, Education has been provided regarding the importance of this vaccine. Advised may receive this vaccine at local pharmacy or Health Dept. Aware to provide a copy of the vaccination record if  obtained from local pharmacy or Health Dept. Verbalized acceptance and understanding.  Covid-19 vaccine status: Completed vaccines  Shingrix Completed?: No.    Education has been provided regarding the importance of this vaccine. Patient has been advised to call insurance company to determine out of pocket expense if they have not yet received this vaccine. Advised may also receive vaccine at local pharmacy or Health Dept. Verbalized acceptance and understanding.  Screening Tests Health Maintenance  Topic Date Due   DTaP/Tdap/Td (3 - Tdap) 09/19/2018   Zoster Vaccines- Shingrix (2 of 2) 05/29/2019   COVID-19 Vaccine (5 - 2023-24 season) 06/30/2022 (Originally 01/22/2022)   MAMMOGRAM  06/15/2023 (Originally 04/12/2020)   Pneumonia Vaccine 64+ Years old  Completed   INFLUENZA VACCINE  Completed   DEXA SCAN  Completed   HPV VACCINES  Aged Out    Health Maintenance  Health Maintenance Due  Topic Date Due   DTaP/Tdap/Td (3 - Tdap) 09/19/2018   Zoster Vaccines- Shingrix (2 of 2) 05/29/2019    Mammogram- declined per patient preference.   Lung Cancer Screening: (Low Dose CT Chest recommended if Age 9-80 years, 30 pack-year currently smoking OR have quit w/in 15years.) does not qualify.   Hepatitis C Screening: does not qualify.  Vision Screening: Recommended annual ophthalmology exams for early detection of glaucoma and other disorders of the eye.  Dental Screening: Recommended annual dental exams for proper oral hygiene. Upper denture.   Community Resource Referral / Chronic Care Management: CRR required this visit?  No   CCM required this visit?  No      Plan:   Patient requests albuterol rescue inhaler. Currently on Trelegy and Duoneb but would like an inhaler she can keep within reach instead of having to get up and go use the nebulizer. Deferred to PCP.  -Patient and son report dropping off patient placement papers [facility]) in the main office today and request completion and  move in by 06/18/22 if possible. Deferred to PCP.   I have personally reviewed and noted the following in the patient's chart:   Medical and social  history Use of alcohol, tobacco or illicit drugs  Current medications and supplements including opioid prescriptions. Patient is not currently taking opioid prescriptions. Functional ability and status Nutritional status Physical activity Advanced directives List of other physicians Hospitalizations, surgeries, and ER visits in previous 12 months Vitals Screenings to include cognitive, depression, and falls Referrals and appointments  In addition, I have reviewed and discussed with patient certain preventive protocols, quality metrics, and best practice recommendations. A written personalized care plan for preventive services as well as general preventive health recommendations were provided to patient.     Cathey Endow, LPN   [id number]

## 2022-06-14 NOTE — Telephone Encounter (Signed)
Refill request Bupropion Last office visit 02/09/22 Last refill 09/17/21 #180/ Upcoming appointment 08/11/22 See drug warning with Bupropion

## 2022-06-14 NOTE — Telephone Encounter (Signed)
Type of forms received:FL2  Routed UL:[id number] pool  Paperwork received by : Lesly Rubenstein   Individual made aware of 3-5 business day turn around (Y/N):Y  Form completed and patient made aware of charges(Y/N): Y   Faxed to :   Form location: Place in PCP folder

## 2022-06-14 NOTE — Telephone Encounter (Signed)
Placed form in Dr. G's box.  

## 2022-06-14 NOTE — Telephone Encounter (Signed)
Patient requests albuterol rescue inhaler be sent to [facility] on file. Currently using Trelegy and Duoneb but would like an albuterol inhaler she can keep within reach instead of having to get up and go use the nebulizer. Currently in Hospice care and is willing to pay out of pocket.

## 2022-06-14 NOTE — Patient Instructions (Addendum)
[name] [name] , Thank you for taking time to come for your Medicare Wellness Visit. I appreciate your ongoing commitment to your health goals. Please review the following plan we discussed and let me know if I can assist you in the future.   These are the goals we discussed:  Goals Addressed               This Visit's Progress     Patient Stated     Healthy lifestyle (pt-stated)        Walk more for exercise         This is a list of the screening recommended for you and due dates:  Health Maintenance  Topic Date Due   DTaP/Tdap/Td vaccine (3 - Tdap) 09/19/2018   Zoster (Shingles) Vaccine (2 of 2) 05/29/2019   COVID-19 Vaccine (5 - 2023-24 season) 06/30/2022*   Mammogram  06/15/2023*   Pneumonia Vaccine  Completed   Flu Shot  Completed   DEXA scan (bone density measurement)  Completed   HPV Vaccine  Aged Out  *Topic was postponed. The date shown is not the original due date.    Advanced directives: on file  Next appointment: Follow up in one year for your annual wellness visit    Preventive Care 65 Years and Older, Female Preventive care refers to lifestyle choices and visits with your health care provider that can promote health and wellness. What does preventive care include? A yearly physical exam. This is also called an annual well check. Dental exams once or twice a year. Routine eye exams. Ask your health care provider how often you should have your eyes checked. Personal lifestyle choices, including: Daily care of your teeth and gums. Regular physical activity. Eating a healthy diet. Avoiding tobacco and drug use. Limiting alcohol use. Practicing safe sex. Taking low-dose aspirin every day. Taking vitamin and mineral supplements as recommended by your health care provider. What happens during an annual well check? The services and screenings done by your health care provider during your annual well check will depend on your age, overall health, lifestyle  risk factors, and family history of disease. Counseling  Your health care provider may ask you questions about your: Alcohol use. Tobacco use. Drug use. Emotional well-being. Home and relationship well-being. Sexual activity. Eating habits. History of falls. Memory and ability to understand (cognition). Work and work Astronomer. Reproductive health. Screening  You may have the following tests or measurements: Height, weight, and BMI. Blood pressure. Lipid and cholesterol levels. These may be checked every 5 years, or more frequently if you are over 53 years old. Skin check. Lung cancer screening. You may have this screening every year starting at age 98 if you have a 30-pack-year history of smoking and currently smoke or have quit within the past 15 years. Fecal occult blood test (FOBT) of the stool. You may have this test every year starting at age 54. Flexible sigmoidoscopy or colonoscopy. You may have a sigmoidoscopy every 5 years or a colonoscopy every 10 years starting at age 14. Hepatitis C blood test. Hepatitis B blood test. Sexually transmitted disease (STD) testing. Diabetes screening. This is done by checking your blood sugar (glucose) after you have not eaten for a while (fasting). You may have this done every 1-3 years. Bone density scan. This is done to screen for osteoporosis. You may have this done starting at age 20. Mammogram. This may be done every 1-2 years. Talk to your health care provider about how  often you should have regular mammograms. Talk with your health care provider about your test results, treatment options, and if necessary, the need for more tests. Vaccines  Your health care provider may recommend certain vaccines, such as: Influenza vaccine. This is recommended every year. Tetanus, diphtheria, and acellular pertussis (Tdap, Td) vaccine. You may need a Td booster every 10 years. Zoster vaccine. You may need this after age 54. Pneumococcal  13-valent conjugate (PCV13) vaccine. One dose is recommended after age 73. Pneumococcal polysaccharide (PPSV23) vaccine. One dose is recommended after age 38. Talk to your health care provider about which screenings and vaccines you need and how often you need them. This information is not intended to replace advice given to you by your health care provider. Make sure you discuss any questions you have with your health care provider. Document Released: 06/06/2015 Document Revised: 01/28/2016 Document Reviewed: 03/11/2015 Elsevier Interactive Patient Education  2017 [employer].  Fall Prevention in the Home Falls can cause injuries. They can happen to people of all ages. There are many things you can do to make your home safe and to help prevent falls. What can I do on the outside of my home? Regularly fix the edges of walkways and driveways and fix any cracks. Remove anything that might make you trip as you walk through a door, such as a raised step or threshold. Trim any bushes or trees on the path to your home. Use bright outdoor lighting. Clear any walking paths of anything that might make someone trip, such as rocks or tools. Regularly check to see if handrails are loose or broken. Make sure that both sides of any steps have handrails. Any raised decks and porches should have guardrails on the edges. Have any leaves, snow, or ice cleared regularly. Use sand or salt on walking paths during winter. Clean up any spills in your garage right away. This includes oil or grease spills. What can I do in the bathroom? Use night lights. Install grab bars by the toilet and in the tub and shower. Do not use towel bars as grab bars. Use non-skid mats or decals in the tub or shower. If you need to sit down in the shower, use a plastic, non-slip stool. Keep the floor dry. Clean up any water that spills on the floor as soon as it happens. Remove soap buildup in the tub or shower regularly. Attach  bath mats securely with double-sided non-slip rug tape. Do not have throw rugs and other things on the floor that can make you trip. What can I do in the bedroom? Use night lights. Make sure that you have a light by your bed that is easy to reach. Do not use any sheets or blankets that are too big for your bed. They should not hang down onto the floor. Have a firm chair that has side arms. You can use this for support while you get dressed. Do not have throw rugs and other things on the floor that can make you trip. What can I do in the kitchen? Clean up any spills right away. Avoid walking on wet floors. Keep items that you use a lot in easy-to-reach places. If you need to reach something above you, use a strong step stool that has a grab bar. Keep electrical cords out of the way. Do not use floor polish or wax that makes floors slippery. If you must use wax, use non-skid floor wax. Do not have throw rugs and other things  on the floor that can make you trip. What can I do with my stairs? Do not leave any items on the stairs. Make sure that there are handrails on both sides of the stairs and use them. Fix handrails that are broken or loose. Make sure that handrails are as long as the stairways. Check any carpeting to make sure that it is firmly attached to the stairs. Fix any carpet that is loose or worn. Avoid having throw rugs at the top or bottom of the stairs. If you do have throw rugs, attach them to the floor with carpet tape. Make sure that you have a light switch at the top of the stairs and the bottom of the stairs. If you do not have them, ask someone to add them for you. What else can I do to help prevent falls? Wear shoes that: Do not have high heels. Have rubber bottoms. Are comfortable and fit you well. Are closed at the toe. Do not wear sandals. If you use a stepladder: Make sure that it is fully opened. Do not climb a closed stepladder. Make sure that both sides of the  stepladder are locked into place. Ask someone to hold it for you, if possible. Clearly mark and make sure that you can see: Any grab bars or handrails. First and last steps. Where the edge of each step is. Use tools that help you move around (mobility aids) if they are needed. These include: Canes. Walkers. Scooters. Crutches. Turn on the lights when you go into a dark area. Replace any light bulbs as soon as they burn out. Set up your furniture so you have a clear path. Avoid moving your furniture around. If any of your floors are uneven, fix them. If there are any pets around you, be aware of where they are. Review your medicines with your doctor. Some medicines can make you feel dizzy. This can increase your chance of falling. Ask your doctor what other things that you can do to help prevent falls. This information is not intended to replace advice given to you by your health care provider. Make sure you discuss any questions you have with your health care provider. Document Released: 03/06/2009 Document Revised: 10/16/2015 Document Reviewed: 06/14/2014 Elsevier Interactive Patient Education  2017 [employer].

## 2022-06-14 NOTE — Telephone Encounter (Deleted)
Filled and in Lisa's box 

## 2022-06-14 NOTE — Telephone Encounter (Signed)
Albuterol inh sent to requested pharmacy.

## 2022-06-15 NOTE — Telephone Encounter (Signed)
Noted. See subsequent phone note re FL2.

## 2022-06-15 NOTE — Telephone Encounter (Signed)
Filled and in Lisa's box 

## 2022-06-15 NOTE — Telephone Encounter (Signed)
Patient called an asked was the form ready to be picked up. Call back number [phone].

## 2022-06-15 NOTE — Telephone Encounter (Signed)
Patient's son Arlys John notified by telephone that completed forms are up front ready for pickup. Arlys John stated that he will pick the forms up tomorrow 06/16/22 around 9:00. Copy made for patient's chart.

## 2022-06-15 NOTE — Telephone Encounter (Signed)
ERx 

## 2022-06-15 NOTE — Telephone Encounter (Signed)
Patient notified as instructed by telephone and verbalized understanding. Patient stated that she wanted to let Dr. Sharen Hones know that she will probably be moving to [facility] Saturday.

## 2022-06-19 ENCOUNTER — Other Ambulatory Visit: Payer: Self-pay | Admitting: Family Medicine

## 2022-06-19 DIAGNOSIS — R739 Hyperglycemia, unspecified: Secondary | ICD-10-CM

## 2022-06-21 ENCOUNTER — Telehealth: Payer: Self-pay | Admitting: Family Medicine

## 2022-06-21 DIAGNOSIS — M85852 Other specified disorders of bone density and structure, left thigh: Secondary | ICD-10-CM

## 2022-06-21 NOTE — Telephone Encounter (Signed)
Letter printed and placed in [name] [name]'s box.

## 2022-06-21 NOTE — Telephone Encounter (Signed)
Fax to [facility]  Fx [phone] or [phone]

## 2022-06-21 NOTE — Telephone Encounter (Signed)
[name] [name], daughter called stating that her mom is in  an assistant living facility now and they need a letter stating that due to her COPD she is allowed to have her nebulizer,inhaler, Spirivia,trilugy,albuterol ,and hand held inhaler in her room with .Sometimes when she's having an episode she needs all of these medications to help her.

## 2022-06-22 ENCOUNTER — Telehealth: Payer: Self-pay | Admitting: Family Medicine

## 2022-06-22 NOTE — Telephone Encounter (Signed)
Signed orders and placed in Lisa's box.  Per family, pt no longer taking alendronate - ok to stay off.

## 2022-06-22 NOTE — Telephone Encounter (Signed)
error 

## 2022-06-22 NOTE — Telephone Encounter (Signed)
New letter written and in [name] [name]'s box.

## 2022-06-22 NOTE — Telephone Encounter (Signed)
Faxed letter and orders to [facility] at [phone], attn: Vaughan Basta.

## 2022-06-22 NOTE — Telephone Encounter (Addendum)
Checked the provided numbers to see which is correct fax #. Neither number is a fax #.   Spoke with Vaughan Basta of [facility] asking for a fax #.  She provided fax #:  [phone]. She states it's ok to send the letter. But they need a written order with dose and sig for neb tx and inhalers and pt is able to keep in her room. This way they can make sure pt is getting meds accurately because sometimes she forgets. Letter and written order can be faxed to Linda's attn.   [Placed letter in [employer].]

## 2022-06-22 NOTE — Addendum Note (Signed)
Addended by: Ria Bush on: 06/22/2022 04:52 PM   Modules accepted: Orders

## 2022-06-22 NOTE — Telephone Encounter (Signed)
Per Armandina Stammer, pt's daughter, [name], dropped off orders from [facility] to be signed and faxed.   Placed orders in Dr. Synthia Innocent box.

## 2022-06-24 DIAGNOSIS — N390 Urinary tract infection, site not specified: Secondary | ICD-10-CM | POA: Diagnosis not present

## 2022-06-24 DIAGNOSIS — R0602 Shortness of breath: Secondary | ICD-10-CM | POA: Diagnosis not present

## 2022-07-16 DIAGNOSIS — F419 Anxiety disorder, unspecified: Secondary | ICD-10-CM | POA: Diagnosis not present

## 2022-07-16 DIAGNOSIS — G3184 Mild cognitive impairment, so stated: Secondary | ICD-10-CM | POA: Diagnosis not present

## 2022-07-16 DIAGNOSIS — F32A Depression, unspecified: Secondary | ICD-10-CM | POA: Diagnosis not present

## 2022-07-22 ENCOUNTER — Telehealth: Payer: Self-pay | Admitting: Family Medicine

## 2022-07-22 NOTE — Telephone Encounter (Signed)
Lattie Haw with [facility] called to give Dr Darnell Level some information. She said they have that they have in their system that Dr Darnell Level ok'd patient to take [place] 5 3 25. 1 every 6 hours as needed for pain. She said they dont have a signed order, so they would need one sent to them. Callback is [phone].

## 2022-07-23 NOTE — Telephone Encounter (Addendum)
Would clarify - do they need Rx sent to pharmacy, or do they need a signed order faxed to them?  How is pt doing regarding pain currently?

## 2022-07-23 NOTE — Telephone Encounter (Signed)
Called Lattie Haw states that it was addressed by their doctor. She was not aware that patient had made change.

## 2022-07-31 ENCOUNTER — Other Ambulatory Visit: Payer: Self-pay | Admitting: Family Medicine

## 2022-07-31 DIAGNOSIS — M85852 Other specified disorders of bone density and structure, left thigh: Secondary | ICD-10-CM

## 2022-07-31 DIAGNOSIS — E785 Hyperlipidemia, unspecified: Secondary | ICD-10-CM

## 2022-07-31 DIAGNOSIS — R739 Hyperglycemia, unspecified: Secondary | ICD-10-CM

## 2022-08-04 ENCOUNTER — Other Ambulatory Visit: Payer: PPO

## 2022-08-05 DIAGNOSIS — F419 Anxiety disorder, unspecified: Secondary | ICD-10-CM | POA: Diagnosis not present

## 2022-08-05 DIAGNOSIS — F32A Depression, unspecified: Secondary | ICD-10-CM | POA: Diagnosis not present

## 2022-08-05 DIAGNOSIS — G3184 Mild cognitive impairment, so stated: Secondary | ICD-10-CM | POA: Diagnosis not present

## 2022-08-10 DIAGNOSIS — G3184 Mild cognitive impairment, so stated: Secondary | ICD-10-CM | POA: Diagnosis not present

## 2022-08-10 DIAGNOSIS — F419 Anxiety disorder, unspecified: Secondary | ICD-10-CM | POA: Diagnosis not present

## 2022-08-10 DIAGNOSIS — F32A Depression, unspecified: Secondary | ICD-10-CM | POA: Diagnosis not present

## 2022-08-11 ENCOUNTER — Encounter: Payer: PPO | Admitting: Family Medicine

## 2022-08-13 ENCOUNTER — Encounter: Payer: Self-pay | Admitting: Family Medicine

## 2022-09-06 DIAGNOSIS — F419 Anxiety disorder, unspecified: Secondary | ICD-10-CM | POA: Diagnosis not present

## 2022-09-06 DIAGNOSIS — G3184 Mild cognitive impairment, so stated: Secondary | ICD-10-CM | POA: Diagnosis not present

## 2022-09-06 DIAGNOSIS — F32A Depression, unspecified: Secondary | ICD-10-CM | POA: Diagnosis not present

## 2022-09-13 DIAGNOSIS — F419 Anxiety disorder, unspecified: Secondary | ICD-10-CM | POA: Diagnosis not present

## 2022-09-13 DIAGNOSIS — F32A Depression, unspecified: Secondary | ICD-10-CM | POA: Diagnosis not present

## 2022-09-21 DIAGNOSIS — Z79899 Other long term (current) drug therapy: Secondary | ICD-10-CM | POA: Diagnosis not present

## 2022-09-21 DIAGNOSIS — F419 Anxiety disorder, unspecified: Secondary | ICD-10-CM | POA: Diagnosis not present

## 2022-09-21 DIAGNOSIS — G3184 Mild cognitive impairment, so stated: Secondary | ICD-10-CM | POA: Diagnosis not present

## 2022-10-04 DIAGNOSIS — G3184 Mild cognitive impairment, so stated: Secondary | ICD-10-CM | POA: Diagnosis not present

## 2022-10-04 DIAGNOSIS — F32A Depression, unspecified: Secondary | ICD-10-CM | POA: Diagnosis not present

## 2022-10-04 DIAGNOSIS — F419 Anxiety disorder, unspecified: Secondary | ICD-10-CM | POA: Diagnosis not present

## 2022-10-13 DIAGNOSIS — F32A Depression, unspecified: Secondary | ICD-10-CM | POA: Diagnosis not present

## 2022-10-13 DIAGNOSIS — G3184 Mild cognitive impairment, so stated: Secondary | ICD-10-CM | POA: Diagnosis not present

## 2022-10-13 DIAGNOSIS — F419 Anxiety disorder, unspecified: Secondary | ICD-10-CM | POA: Diagnosis not present

## 2022-10-16 DIAGNOSIS — R059 Cough, unspecified: Secondary | ICD-10-CM | POA: Diagnosis not present

## 2022-11-01 DIAGNOSIS — F32A Depression, unspecified: Secondary | ICD-10-CM | POA: Diagnosis not present

## 2022-11-01 DIAGNOSIS — F419 Anxiety disorder, unspecified: Secondary | ICD-10-CM | POA: Diagnosis not present

## 2022-11-01 DIAGNOSIS — G3184 Mild cognitive impairment, so stated: Secondary | ICD-10-CM | POA: Diagnosis not present

## 2022-11-11 DIAGNOSIS — F32A Depression, unspecified: Secondary | ICD-10-CM | POA: Diagnosis not present

## 2022-11-11 DIAGNOSIS — G3184 Mild cognitive impairment, so stated: Secondary | ICD-10-CM | POA: Diagnosis not present

## 2022-11-11 DIAGNOSIS — F419 Anxiety disorder, unspecified: Secondary | ICD-10-CM | POA: Diagnosis not present

## 2022-12-06 DIAGNOSIS — F419 Anxiety disorder, unspecified: Secondary | ICD-10-CM | POA: Diagnosis not present

## 2022-12-06 DIAGNOSIS — G3184 Mild cognitive impairment, so stated: Secondary | ICD-10-CM | POA: Diagnosis not present

## 2022-12-06 DIAGNOSIS — F32A Depression, unspecified: Secondary | ICD-10-CM | POA: Diagnosis not present

## 2022-12-14 ENCOUNTER — Telehealth: Payer: Self-pay

## 2022-12-14 NOTE — Telephone Encounter (Signed)
No answer and mailbox full for patient to schedule follow up appointment with office for chronic care.  [name] [name], [facility] Health Specialist

## 2022-12-16 DIAGNOSIS — G3184 Mild cognitive impairment, so stated: Secondary | ICD-10-CM | POA: Diagnosis not present

## 2022-12-16 DIAGNOSIS — F419 Anxiety disorder, unspecified: Secondary | ICD-10-CM | POA: Diagnosis not present

## 2022-12-16 DIAGNOSIS — F32A Depression, unspecified: Secondary | ICD-10-CM | POA: Diagnosis not present

## 2023-01-10 DIAGNOSIS — F419 Anxiety disorder, unspecified: Secondary | ICD-10-CM | POA: Diagnosis not present

## 2023-01-10 DIAGNOSIS — F32A Depression, unspecified: Secondary | ICD-10-CM | POA: Diagnosis not present

## 2023-01-10 DIAGNOSIS — G3184 Mild cognitive impairment, so stated: Secondary | ICD-10-CM | POA: Diagnosis not present

## 2023-02-10 DIAGNOSIS — G3184 Mild cognitive impairment, so stated: Secondary | ICD-10-CM | POA: Diagnosis not present

## 2023-02-10 DIAGNOSIS — F419 Anxiety disorder, unspecified: Secondary | ICD-10-CM | POA: Diagnosis not present

## 2023-02-10 DIAGNOSIS — F32A Depression, unspecified: Secondary | ICD-10-CM | POA: Diagnosis not present

## 2023-07-19 ENCOUNTER — Other Ambulatory Visit: Payer: Self-pay | Admitting: Family Medicine

## 2023-07-19 DIAGNOSIS — J432 Centrilobular emphysema: Secondary | ICD-10-CM

## 2023-07-19 NOTE — Telephone Encounter (Signed)
 Trelegy Last filled:  06/20/23, #1 ea Last OV:  02/09/22, 6 mo f/u Next OV:  none

## 2023-07-20 NOTE — Telephone Encounter (Signed)
 She now sees a provider at [facility] 1 month. Future refills to go through pulm or her new provider at [facility].

## 2023-07-20 NOTE — Telephone Encounter (Signed)
 Noted.

## 2023-08-27 ENCOUNTER — Observation Stay
Admission: EM | Admit: 2023-08-27 | Discharge: 2023-08-28 | Disposition: A | Discharge status: Home / Self Care | Attending: Osteopathic Medicine | Admitting: Osteopathic Medicine

## 2023-08-27 ENCOUNTER — Encounter: Payer: Self-pay | Admitting: Internal Medicine

## 2023-08-27 ENCOUNTER — Other Ambulatory Visit: Payer: Self-pay

## 2023-08-27 ENCOUNTER — Emergency Department

## 2023-08-27 DIAGNOSIS — I5022 Chronic systolic (congestive) heart failure: Secondary | ICD-10-CM | POA: Diagnosis not present

## 2023-08-27 DIAGNOSIS — I110 Hypertensive heart disease with heart failure: Secondary | ICD-10-CM | POA: Insufficient documentation

## 2023-08-27 DIAGNOSIS — F32A Depression, unspecified: Secondary | ICD-10-CM | POA: Insufficient documentation

## 2023-08-27 DIAGNOSIS — J9611 Chronic respiratory failure with hypoxia: Secondary | ICD-10-CM | POA: Insufficient documentation

## 2023-08-27 DIAGNOSIS — F1090 Alcohol use, unspecified, uncomplicated: Secondary | ICD-10-CM | POA: Insufficient documentation

## 2023-08-27 DIAGNOSIS — R059 Cough, unspecified: Secondary | ICD-10-CM | POA: Diagnosis present

## 2023-08-27 DIAGNOSIS — K219 Gastro-esophageal reflux disease without esophagitis: Secondary | ICD-10-CM | POA: Insufficient documentation

## 2023-08-27 DIAGNOSIS — E782 Mixed hyperlipidemia: Secondary | ICD-10-CM | POA: Insufficient documentation

## 2023-08-27 DIAGNOSIS — Z1152 Encounter for screening for COVID-19: Secondary | ICD-10-CM | POA: Diagnosis not present

## 2023-08-27 DIAGNOSIS — J441 Chronic obstructive pulmonary disease with (acute) exacerbation: Principal | ICD-10-CM | POA: Insufficient documentation

## 2023-08-27 DIAGNOSIS — F419 Anxiety disorder, unspecified: Secondary | ICD-10-CM | POA: Diagnosis not present

## 2023-08-27 DIAGNOSIS — J449 Chronic obstructive pulmonary disease, unspecified: Principal | ICD-10-CM | POA: Diagnosis present

## 2023-08-27 DIAGNOSIS — Z87891 Personal history of nicotine dependence: Secondary | ICD-10-CM | POA: Insufficient documentation

## 2023-08-27 DIAGNOSIS — R0600 Dyspnea, unspecified: Principal | ICD-10-CM

## 2023-08-27 LAB — COMPREHENSIVE METABOLIC PANEL WITH GFR
ALT: 12 U/L (ref 0–44)
AST: 14 U/L — ABNORMAL LOW (ref 15–41)
Albumin: 3.3 g/dL — ABNORMAL LOW (ref 3.5–5.0)
Alkaline Phosphatase: 38 U/L (ref 38–126)
Anion gap: 7 (ref 5–15)
BUN: 15 mg/dL (ref 8–23)
CO2: 39 mmol/L — ABNORMAL HIGH (ref 22–32)
Calcium: 8.9 mg/dL (ref 8.9–10.3)
Chloride: 92 mmol/L — ABNORMAL LOW (ref 98–111)
Creatinine, Ser: 0.88 mg/dL (ref 0.44–1.00)
GFR, Estimated: >60 mL/min (ref >60)
Glucose, Bld: 117 mg/dL — ABNORMAL HIGH (ref 70–99)
Potassium: 4.6 mmol/L (ref 3.5–5.1)
Sodium: 138 mmol/L (ref 135–145)
Total Bilirubin: 0.4 mg/dL (ref 0.0–1.2)
Total Protein: 6.3 g/dL — ABNORMAL LOW (ref 6.5–8.1)

## 2023-08-27 LAB — CBC
HCT: 33.7 % — ABNORMAL LOW (ref 36.0–46.0)
Hemoglobin: 10.3 g/dL — ABNORMAL LOW (ref 12.0–15.0)
MCH: 30.7 pg (ref 26.0–34.0)
MCHC: 30.6 g/dL (ref 30.0–36.0)
MCV: 100.6 fL — ABNORMAL HIGH (ref 80.0–100.0)
Platelets: 167 10*3/uL (ref 150–400)
RBC: 3.35 MIL/uL — ABNORMAL LOW (ref 3.87–5.11)
RDW: 12.1 % (ref 11.5–15.5)
WBC: 8.1 10*3/uL (ref 4.0–10.5)
nRBC: 0 % (ref 0.0–0.2)

## 2023-08-27 LAB — RESP PANEL BY RT-PCR (RSV, FLU A&B, COVID)  RVPGX2
Influenza A by PCR: NEGATIVE
Influenza B by PCR: NEGATIVE
Resp Syncytial Virus by PCR: NEGATIVE
SARS Coronavirus 2 by RT PCR: NEGATIVE

## 2023-08-27 LAB — BLOOD GAS, VENOUS
Acid-Base Excess: 17.5 mmol/L — ABNORMAL HIGH (ref 0.0–2.0)
Bicarbonate: 48 mmol/L — ABNORMAL HIGH (ref 20.0–28.0)
O2 Saturation: 69.3 %
Patient temperature: 37
pCO2, Ven: 89 mmHg (ref 44–60)
pH, Ven: 7.34 (ref 7.25–7.43)
pO2, Ven: 40 mmHg (ref 32–45)

## 2023-08-27 LAB — TROPONIN I (HIGH SENSITIVITY): Troponin I (High Sensitivity): 6 ng/L (ref <18)

## 2023-08-27 LAB — BRAIN NATRIURETIC PEPTIDE: B Natriuretic Peptide: 160 pg/mL — ABNORMAL HIGH (ref 0.0–100.0)

## 2023-08-27 MED ORDER — ACETAZOLAMIDE SODIUM 500 MG IJ SOLR
250.0000 mg | Freq: Once | INTRAMUSCULAR | Status: AC
  Administered 2023-08-27: 250 mg via INTRAVENOUS
  Filled 2023-08-27: qty 250

## 2023-08-27 MED ORDER — ENOXAPARIN SODIUM 40 MG/0.4ML IJ SOSY
40.0000 mg | PREFILLED_SYRINGE | INTRAMUSCULAR | Status: DC
  Administered 2023-08-27: 40 mg via SUBCUTANEOUS
  Filled 2023-08-27: qty 0.4

## 2023-08-27 MED ORDER — LOSARTAN POTASSIUM 50 MG PO TABS
100.0000 mg | ORAL_TABLET | Freq: Every day | ORAL | Status: DC
  Administered 2023-08-27 – 2023-08-28 (×2): 100 mg via ORAL
  Filled 2023-08-27 (×2): qty 2

## 2023-08-27 MED ORDER — FUROSEMIDE 10 MG/ML IJ SOLN: 60.0000 mg | Freq: Once | INTRAMUSCULAR | Status: DC

## 2023-08-27 MED ORDER — ONDANSETRON HCL 4 MG PO TABS: 4.0000 mg | ORAL_TABLET | Freq: Three times a day (TID) | ORAL | Status: DC | PRN

## 2023-08-27 MED ORDER — METHYLPREDNISOLONE SODIUM SUCC 125 MG IJ SOLR
125.0000 mg | Freq: Once | INTRAMUSCULAR | Status: AC
  Administered 2023-08-27: 125 mg via INTRAVENOUS
  Filled 2023-08-27: qty 2

## 2023-08-27 MED ORDER — PREDNISONE 20 MG PO TABS: 40.0000 mg | ORAL_TABLET | Freq: Every day | ORAL | Status: DC

## 2023-08-27 MED ORDER — HYDROXYZINE HCL 10 MG PO TABS: 10.0000 mg | ORAL_TABLET | Freq: Every day | ORAL | Status: DC | PRN

## 2023-08-27 MED ORDER — METHYLPREDNISOLONE SODIUM SUCC 40 MG IJ SOLR
40.0000 mg | Freq: Two times a day (BID) | INTRAMUSCULAR | Status: DC
  Administered 2023-08-27: 40 mg via INTRAVENOUS
  Filled 2023-08-27: qty 1

## 2023-08-27 MED ORDER — FLUTICASONE PROPIONATE 50 MCG/ACT NA SUSP: 2.0000 | Freq: Every day | NASAL | Status: DC | PRN

## 2023-08-27 MED ORDER — METOPROLOL TARTRATE 25 MG PO TABS
25.0000 mg | ORAL_TABLET | Freq: Two times a day (BID) | ORAL | Status: DC
  Administered 2023-08-27 – 2023-08-28 (×2): 25 mg via ORAL
  Filled 2023-08-27 (×2): qty 1

## 2023-08-27 MED ORDER — LORATADINE 10 MG PO TABS: 10.0000 mg | ORAL_TABLET | Freq: Every day | ORAL | Status: DC | PRN

## 2023-08-27 MED ORDER — IPRATROPIUM-ALBUTEROL 0.5-2.5 (3) MG/3ML IN SOLN
3.0000 mL | RESPIRATORY_TRACT | Status: DC
  Administered 2023-08-27 – 2023-08-28 (×5): 3 mL via RESPIRATORY_TRACT
  Filled 2023-08-27 (×6): qty 3

## 2023-08-27 MED ORDER — BUPROPION HCL ER (SR) 150 MG PO TB12
150.0000 mg | ORAL_TABLET | Freq: Two times a day (BID) | ORAL | Status: DC
  Administered 2023-08-27 – 2023-08-28 (×2): 150 mg via ORAL
  Filled 2023-08-27 (×5): qty 1

## 2023-08-27 MED ORDER — ALBUTEROL SULFATE (2.5 MG/3ML) 0.083% IN NEBU: 2.5000 mg | INHALATION_SOLUTION | Freq: Four times a day (QID) | RESPIRATORY_TRACT | Status: DC | PRN

## 2023-08-27 MED ORDER — PRAVASTATIN SODIUM 20 MG PO TABS
80.0000 mg | ORAL_TABLET | Freq: Every day | ORAL | Status: DC
  Administered 2023-08-27: 80 mg via ORAL
  Filled 2023-08-27: qty 4

## 2023-08-27 MED ORDER — UMECLIDINIUM BROMIDE 62.5 MCG/ACT IN AEPB: 1.0000 | INHALATION_SPRAY | Freq: Every day | RESPIRATORY_TRACT | Status: DC

## 2023-08-27 MED ORDER — EZETIMIBE 10 MG PO TABS
10.0000 mg | ORAL_TABLET | Freq: Every day | ORAL | Status: DC
  Administered 2023-08-28: 10 mg via ORAL
  Filled 2023-08-27 (×2): qty 1

## 2023-08-27 MED ORDER — IPRATROPIUM-ALBUTEROL 0.5-2.5 (3) MG/3ML IN SOLN
3.0000 mL | Freq: Once | RESPIRATORY_TRACT | Status: AC
  Administered 2023-08-27: 3 mL via RESPIRATORY_TRACT
  Filled 2023-08-27: qty 3

## 2023-08-27 MED ORDER — FUROSEMIDE 10 MG/ML IJ SOLN
40.0000 mg | Freq: Once | INTRAMUSCULAR | Status: AC
  Administered 2023-08-27: 40 mg via INTRAVENOUS
  Filled 2023-08-27: qty 4

## 2023-08-27 MED ORDER — MIRTAZAPINE 15 MG PO TABS
15.0000 mg | ORAL_TABLET | Freq: Every day | ORAL | Status: DC
  Administered 2023-08-27: 15 mg via ORAL
  Filled 2023-08-27: qty 1

## 2023-08-27 MED ORDER — FLUTICASONE FUROATE-VILANTEROL 200-25 MCG/ACT IN AEPB
1.0000 | INHALATION_SPRAY | Freq: Every day | RESPIRATORY_TRACT | Status: DC
  Administered 2023-08-28: 1 via RESPIRATORY_TRACT
  Filled 2023-08-27: qty 28

## 2023-08-27 NOTE — ED Notes (Signed)
 This RN called [facility] [phone]) to discuss pts hospice status and agreements. Triage nurse stated she would have the on call nurse contact this RN.

## 2023-08-27 NOTE — H&P (Signed)
 History and Physical    [name] [name] ZOX:[id number] DOB: January 26, 1941 DOA: 08/27/2023  PCP: [facility] (Confirm with patient/family/NH records and if not entered, this has to be entered at [facility] point of entry) Patient coming from: SNF  I have personally briefly reviewed patient's old medical records in [facility]  Chief Complaint: Cough, SOB  HPI: [name] [name] is a 83 y.o. female with medical history significant of COPD, chronic hypoxic respiratory failure on 3 L continuously, HTN, HLD, anxiety/depression, presented with worsening of dry cough and shortness of breath.  Patient started to have seasonal allergy-like symptoms dry cough runny nose 2 days ago and followed by increasing dry cough and wheezing and shortness of breath.  Denies any chest pain no fever or chills.  This morning, the facility staff found the patient was seen hypoxia O2 saturation in the 80s on 3 L and called EMS.  ED Course: Afebrile, nontachycardic blood pressure 130/60 O2 saturation 95% on 6 L.  Chest x-ray emphysema like changes but no cardiac findings.  Blood work showed bicarb 39 creatinine 0.8 BUN 15 WBC 8.1.  Slightly elevated proBNP 160.  Patient was given IV Solu-Medrol and 40 mg IV Lasix x 1 in the ED.  Review of Systems: As per HPI otherwise 14 point review of systems negative.    Past Medical History:  Diagnosis Date   Chronic cystitis    recurrent UTI (4+ in last year), on suppressive doxy (uro Cope)   Colon polyp    COPD, severe 01/27/07       Depression with anxiety    Diverticular disease 10/09/09   CT abd no sigmoid mass DDD L5/S1   Dyslipidemia    GERD (gastroesophageal reflux disease)    Hematuria    h/o kidney stones   Hiatal hernia    Hypertension    Left scapholunate ligament tear 2014   found on imaging   Lung cancer (HCC) 1987   Partial RLL Lobectomy.   Osteoarthritis    Osteopenia 08/2011   DEXA: L femur -1.6   Peptic ulcer    Spinal abscess  (HCC) 2000   secondary to periodontal disease    Past Surgical History:  Procedure Laterality Date   BLADDER SURGERY     BREAST BIOPSY Left 03/99   neg   CHOLECYSTECTOMY  02/10/09   COLONOSCOPY WITH PROPOFOL N/A 04/30/2014   Procedure: COLONOSCOPY WITH PROPOFOL;  Surgeon: [name], MD;  Location: [facility];  Service: Endoscopy;  Laterality: N/A;   CYSTOSCOPY  02/11/07 and 09/25/08   Dr. Achilles Dunk, normal   CYSTOSCOPY  07/2019   WNL Apolinar Junes)   DEXA  08/2011   T femur -1.6, T spine -0.6   MOUTH SURGERY  2000   [facility] Kle[facility]ella pneumonia sepsis due to gum surgery   OOPHORECTOMY     REPLACEMENT TOTAL KNEE  09/2009   Right   TOTAL ABDOMINAL HYSTERECTOMY  07/18/01   benign reason, BSO, vag repair secondary prolapse   TUBAL LIGATION     bilateral     reports that she quit smoking about 28 years ago. Her smoking use included cigarettes. She started smoking about 62 years ago. She has a 102 pack-year smoking history. She has never used smokeless tobacco. She reports current alcohol use. She reports that she does not use drugs.  Allergies  Allergen Reactions   Keflex [Cephalexin] Shortness Of Breath   Sulbactam Itching   Penicillins     REACTION: hives  Family History  Problem Relation Age of Onset   Pancreatitis Mother    Heart disease Father    Hypertension Father    Emphysema Father    Arthritis Brother    Hypertension Brother    Cancer Maternal Aunt        ovarian cancer   Kidney disease Neg Hx    Prostate cancer Neg Hx    Bladder Cancer Neg Hx      Prior to Admission medications   Medication Sig Start Date End Date Taking? Authorizing Provider  amLODipine (NORVASC) 10 MG tablet Take 10 mg by mouth daily. 08/15/23  Yes [provider]  amLODipine (NORVASC) 5 MG tablet Take 5 mg by mouth daily. 08/18/23  Yes [provider]  Baclofen 5 MG TABS Take 1 tablet by mouth 3 (three) times daily. 05/20/23  Yes [provider]  benzonatate  (TESSALON) 100 MG capsule Take 100 mg by mouth daily. 08/15/23  Yes [provider]  buPROPion (WELLBUTRIN SR) 150 MG 12 hr tablet TAKE ONE TABLET BY MOUTH TWICE DAILY 04/13/22  Yes Eustaquio Boyden, MD  buPROPion ER [facility] SR) 100 MG 12 hr tablet Take 100 mg by mouth 2 (two) times daily. 08/15/23  Yes [provider]  conjugated estrogens (PREMARIN) vaginal cream Place vaginally daily. Apply 0.5mg  (pea-sized amount)  just inside the vaginal introitus with a finger-tip on  Monday, Wednesday and Friday nights. 02/09/22  Yes Eustaquio Boyden, MD  DOK 100 MG capsule Take 100 mg by mouth 3 (three) times daily as needed. 08/15/23  Yes [provider]  DULoxetine (CYMBALTA) 30 MG capsule Take 30 mg by mouth 2 (two) times daily. 08/15/23  Yes [provider]  ezetimibe (ZETIA) 10 MG tablet TAKE 1 TABLET BY MOUTH DAILY 10/15/21  Yes Eustaquio Boyden, MD  fluticasone [facility]) 50 MCG/ACT nasal spray Place 2 sprays into both nostrils daily. 02/09/22  Yes Eustaquio Boyden, MD  Fluticasone-Umeclidin-Vilant (TRELEGY ELLIPTA) 200-62.5-25 MCG/ACT AEPB USE ONE PUFF BY MOUTH ONCE A DAY. RINSE MOUTH AFTER EACH USE 07/20/23  Yes Eustaquio Boyden, MD  hydrochlorothiazide (HYDRODIURIL) 12.5 MG tablet Take 12.5 mg by mouth daily. 08/15/23  Yes [provider]  losartan (COZAAR) 100 MG tablet TAKE 1 TABLET BY MOUTH DAILY 01/13/22  Yes Eustaquio Boyden, MD  metoprolol tartrate (LOPRESSOR) 25 MG tablet TAKE ONE TABLET BY MOUTH TWICE DAILY 06/15/22  Yes Eustaquio Boyden, MD  mirtazapine (REMERON) 15 MG tablet Take 1 tablet (15 mg total) by mouth at bedtime. 06/07/22  Yes Eustaquio Boyden, MD  mirtazapine (REMERON) 30 MG tablet Take 30 mg by mouth at bedtime. 08/15/23  Yes [provider]  pravastatin (PRAVACHOL) 80 MG tablet TAKE ONE TABLET BY MOUTH AT BEDTIME 06/21/22  Yes Eustaquio Boyden, MD  senna (SENOKOT) 8.6 MG TABS tablet Take 2 tablets by mouth 2 (two) times daily.  08/15/23  Yes [provider]  traZODone (DESYREL) 50 MG tablet Take 50 mg by mouth at bedtime as needed for sleep. 08/15/23  Yes [provider]  albuterol (VENTOLIN HFA) 108 (90 Base) MCG/ACT inhaler Inhale 2 puffs into the lungs every 6 (six) hours as needed for wheezing or shortness of breath. 06/14/22   Eustaquio Boyden, MD  CRANBERRY PO Take 2 tablets by mouth every morning.     [provider]  hydrOXYzine (ATARAX) 10 MG tablet Take 1 tablet (10 mg total) by mouth daily as needed for anxiety. 02/18/22   Joaquim Nam, MD  ipratropium-albuterol (DUONEB) 0.5-2.5 (3)  MG/3ML SOLN Take 3 mLs by nebulization every 4 (four) hours as needed. 02/24/22 02/24/23  Raechel Chute, MD  loratadine (CLARITIN) 10 MG tablet Take 1 tablet (10 mg total) by mouth daily as needed for allergies. 02/09/22   Eustaquio Boyden, MD  Multiple Vitamins-Minerals (PRESERVISION AREDS 2) CAPS Take 2 capsules by mouth daily. 06/17/20   Eustaquio Boyden, MD  ondansetron (ZOFRAN) 4 MG tablet Take 1 tablet (4 mg total) by mouth every 8 (eight) hours as needed for nausea or vomiting. 06/07/22   Eustaquio Boyden, MD    Physical Exam: Vitals:   08/27/23 7829 08/27/23 0808 08/27/23 0809 08/27/23 1200  BP:  135/67  (!) 141/83  Pulse:  95  90  Resp:  (!) 23  15  Temp:  98 F (36.7 C)    TempSrc:  Oral    SpO2: 96% 95%  100%  Weight:   77.2 kg   Height:   5\' 4"  (1.626 m)     Constitutional: NAD, calm, comfortable Vitals:   08/27/23 0807 08/27/23 0808 08/27/23 0809 08/27/23 1200  BP:  135/67  (!) 141/83  Pulse:  95  90  Resp:  (!) 23  15  Temp:  98 F (36.7 C)    TempSrc:  Oral    SpO2: 96% 95%  100%  Weight:   77.2 kg   Height:   5\' 4"  (1.626 m)    Eyes: PERRL, lids and conjunctivae normal ENMT: Mucous membranes are moist. Posterior pharynx clear of any exudate or lesions.Normal dentition.  Neck: normal, supple, no masses, no thyromegaly Respiratory: Diminished breathing sound  bilaterally, diffused wheezing, no crackles, increasing respiratory effort. No accessory muscle use.  Cardiovascular: Regular rate and rhythm, no murmurs / rubs / gallops. No extremity edema. 2+ pedal pulses. No carotid bruits.  Abdomen: no tenderness, no masses palpated. No hepatosplenomegaly. Bowel sounds positive.  Musculoskeletal: no clubbing / cyanosis. No joint deformity upper and lower extremities. Good ROM, no contractures. Normal muscle tone.  Skin: no rashes, lesions, ulcers. No induration Neurologic: CN 2-12 grossly intact. Sensation intact, DTR normal. Strength 5/5 in all 4.  Psychiatric: Normal judgment and insight. Alert and oriented x 3. Normal mood.     Labs on Admission: I have personally reviewed following labs and imaging studies  CBC: Recent Labs  Lab 08/27/23 0811  WBC 8.1  HGB 10.3*  HCT 33.7*  MCV 100.6*  PLT 167   Basic Metabolic Panel: Recent Labs  Lab 08/27/23 0811  NA 138  K 4.6  CL 92*  CO2 39*  GLUCOSE 117*  BUN 15  CREATININE 0.88  CALCIUM 8.9   GFR: Estimated Creatinine Clearance: 49.6 mL/min (by C-G formula based on SCr of 0.88 mg/dL). Liver Function Tests: Recent Labs  Lab 08/27/23 0811  AST 14*  ALT 12  ALKPHOS 38  BILITOT 0.4  PROT 6.3*  ALBUMIN 3.3*   No results for input(s): "LIPASE", "AMYLASE" in the last 168 hours. No results for input(s): "AMMONIA" in the last 168 hours. Coagulation Profile: No results for input(s): "INR", "PROTIME" in the last 168 hours. Cardiac Enzymes: No results for input(s): "CKTOTAL", "CKMB", "CKMBINDEX", "TROPONINI" in the last 168 hours. BNP (last 3 results) No results for input(s): "PROBNP" in the last 8760 hours. HbA1C: No results for input(s): "HGBA1C" in the last 72 hours. CBG: No results for input(s): "GLUCAP" in the last 168 hours. Lipid Profile: No results for input(s): "CHOL", "HDL", "LDLCALC", "TRIG", "CHOLHDL", "LDLDIRECT" in the last 72 hours. Thyroid Function  Tests: No results  for input(s): "TSH", "T4TOTAL", "FREET4", "T3FREE", "THYROIDAB" in the last 72 hours. Anemia Panel: No results for input(s): "VITAMINB12", "FOLATE", "FERRITIN", "TIBC", "IRON", "RETICCTPCT" in the last 72 hours. Urine analysis:    Component Value Date/Time   COLORURINE Amber 07/24/2012 1505   COLORURINE lt. yellow 04/21/2007 1552   APPEARANCEUR Clear 02/12/2021 1317   LABSPEC 1.029 07/24/2012 1505   PHURINE 5.0 07/24/2012 1505   PHURINE 6.0 04/21/2007 1552   GLUCOSEU Negative 02/12/2021 1317   GLUCOSEU Negative 07/24/2012 1505   HGBUR 1+ 07/24/2012 1505   HGBUR large 04/21/2007 1552   BILIRUBINUR negative 02/09/2022 1518   BILIRUBINUR Negative 02/12/2021 1317   BILIRUBINUR Negative 07/24/2012 1505   KETONESUR Negative 07/24/2012 1505   PROTEINUR Negative 02/09/2022 1518   PROTEINUR Negative 02/12/2021 1317   PROTEINUR 30 mg/dL 13/12/6576 4696   UROBILINOGEN 0.2 02/09/2022 1518   UROBILINOGEN 0.2 04/21/2007 1552   NITRITE positive 02/09/2022 1518   NITRITE Negative 02/12/2021 1317   NITRITE Positive 07/24/2012 1505   NITRITE negative 04/21/2007 1552   LEUKOCYTESUR Moderate (2+) (A) 02/09/2022 1518   LEUKOCYTESUR Trace (A) 02/12/2021 1317   LEUKOCYTESUR 1+ 07/24/2012 1505    Radiological Exams on Admission: DG Chest Portable 1 View Result Date: 08/27/2023 CLINICAL DATA:  83 year old female with shortness of breath, COPD. History of lung cancer and partial right pneumonectomy. EXAM: PORTABLE CHEST 1 VIEW COMPARISON:  Chest CT 03/01/2022 and earlier. FINDINGS: AP view 0820 hours. Large lung volumes and emphysema demonstrated on prior CT. Stable mediastinal contour since 2023. Stable chronic right hilar surgical clips. Calcified aortic atherosclerosis. Numerous chronic rib fractures. Stable trachea. No pneumothorax, pulmonary edema, pleural effusion or acute lung opacity. No acute osseous abnormality identified. Negative visible bowel gas. IMPRESSION: Emphysema (ICD10-J43.9). No acute  cardiopulmonary abnormality. Electronically Signed   By: Odessa Fleming M.D.   On: 08/27/2023 08:51    EKG: Independently reviewed.  Sinus tachycardia, no acute ST changes.  Computer reported that the A-fib however on review, there is a P wave before each QRS  Assessment/Plan Principal Problem:   COPD (chronic obstructive pulmonary disease) (HCC) Active Problems:   Chronic respiratory failure with hypoxia (HCC)   COPD exacerbation (HCC)  (please populate well all problems here in Problem List. (For example, if patient is on BP meds at home and you resume or decide to hold them, it is a problem that needs to be her. Same for CAD, COPD, HLD and so on)  Acute on chronic hypoxic respiratory failure - Check VBG to rule out CO2 retention - Continue IV Solu-Medrol breathing for p.o. steroid - ICS and LABA - DuoNebs and as neededAlbuterol -Incentive spirometry -PT evaluation -Other DDx, patient appears to be euvolemic, hold off further diuresis.  Findings reviewed, patient has no history of CHF or CAD.  HTN -Blood pressure stable, continue losartan  Mixed hyperlipidemia -Continue Zetia and pravastatin  Anxiety/depression -Mentation at baseline, continue Wellbutrin and Remeron   DVT prophylaxis: Lovenox Code Status: Full code Family Communication: Daughter at bedside Disposition Plan: Expect less than 2 midnight hospital stay Consults called: none  Admission status: Tele obs   Emeline General MD [facility] Pager [phone]  08/27/2023, 2:38 PM

## 2023-08-27 NOTE — ED Provider Notes (Signed)
 [facility] Provider Note    Event Date/Time   First MD Initiated Contact with Patient 08/27/23 [phone]     (approximate)  History   Chief Complaint: Shortness of Breath  HPI  [name] [name] is a 83 y.o. female with a past medical history of COPD on 3 L chronically, gastric reflux, hypertension, presents to the emergency department for worsening shortness of breath.  According to the patient since this morning she has been experiencing increased shortness of breath.  Patient denies any chest pain or fever.  No abdominal pain, no cough.  EMS states sats in the upper 80s on 3 L placed on 6 L nasal cannula currently satting in the mid 90s.  Physical Exam   Triage Vital Signs: ED Triage Vitals [08/27/23 0807]  Encounter Vitals Group     BP      Systolic BP Percentile      Diastolic BP Percentile      Pulse      Resp      Temp      Temp src      SpO2 96 %     Weight      Height      Head Circumference      Peak Flow      Pain Score      Pain Loc      Pain Education      Exclude from Growth Chart     Most recent vital signs: Vitals:   08/27/23 0807  SpO2: 96%    General: Awake, no distress.  CV:  Good peripheral perfusion.  Regular rate and rhythm  Resp:  Normal effort.  Equal breath sounds bilaterally.  Good air movement bilaterally. Abd:  No distention.  Soft, nontender.  No rebound or guarding.  ED Results / Procedures / Treatments   EKG  EKG viewed and interpreted by myself shows what appears to be a sinus rhythm at 89 bpm with a narrow QRS, normal axis, normal intervals, nonspecific ST changes.  RADIOLOGY  I have reviewed and interpreted the chest x-ray images no obvious consolidation on my evaluation. Radiology has read the x-ray consistent with emphysema but no acute process.   MEDICATIONS ORDERED IN ED: Medications  methylPREDNISolone sodium succinate (SOLU-MEDROL) 125 mg/2 mL injection 125 mg (has no administration in time  range)  ipratropium-albuterol (DUONEB) 0.5-2.5 (3) MG/3ML nebulizer solution 3 mL (has no administration in time range)     IMPRESSION / MDM / ASSESSMENT AND PLAN / ED COURSE  I reviewed the triage vital signs and the nursing notes.  Patient's presentation is most consistent with acute presentation with potential threat to life or bodily function.  Patient presents to the emergency department for worsening shortness of breath.  Overall patient appears well, no distress.  Lying in bed currently on 6 L.  Denies any chest pain abdominal pain fever cough.  We will check labs including a respiratory panel, chest x-ray.  We will dose Solu-Medrol and a DuoNeb while awaiting for the results.  Patient agreeable plan of care.  X-ray consistent with emphysema.  Respiratory panel is negative.  CBC is reassuring, chemistry is reassuring.  Troponin is negative.  Patient's BNP is elevated to 160.  Given the patient's increased oxygen requirement shortness of breath patient has received Solu-Medrol and DuoNebs for likely COPD exacerbation however given her elevated BNP there could be a degree of CHF causing her increased oxygen requirement as well.  Will dose 40  mg of IV Lasix.  Will discuss with the hospitalist for admission.  FINAL CLINICAL IMPRESSION(S) / ED DIAGNOSES   Dyspnea COPD exacerbation CHF  Note:  This document was prepared using Dragon voice recognition software and may include unintentional dictation errors.   Minna Antis, MD 08/27/23 [phone]

## 2023-08-27 NOTE — ED Notes (Signed)
 [facility] called to let this RN know they will be following up with this pts care.

## 2023-08-27 NOTE — Progress Notes (Signed)
 VBG showed 7.34/89/40, implying compensated chronic respiratory acidosis, will give one dose of Diamox and repeat VBG in AM.

## 2023-08-27 NOTE — ED Notes (Signed)
 Pt son at bedside. Warm blanket given. No further needs expressed.

## 2023-08-27 NOTE — ED Triage Notes (Signed)
 Pt BIB [facility] from [facility] c/o SOB. Pt has hx of COPD and is on hospice. Pt wears 3L @ baseline but was placed on 6L by ems sat @ 96%. The facility administered one breathing treatment and morphine.  83 hr 151/71 56 end tital

## 2023-08-27 NOTE — Group Note (Deleted)
 Date:  08/27/2023 Time:  9:53 PM  Group Topic/Focus:  Wrap-Up Group:   The focus of this group is to help patients review their daily goal of treatment and discuss progress on daily workbooks.     Participation Level:  {BHH PARTICIPATION WUJWJ:19147}  Participation Quality:  {BHH PARTICIPATION QUALITY:22265}  Affect:  {BHH AFFECT:22266}  Cognitive:  {BHH COGNITIVE:22267}  Insight: {BHH Insight2:20797}  Engagement in Group:  {BHH ENGAGEMENT IN WGNFA:21308}  Modes of Intervention:  {BHH MODES OF INTERVENTION:22269}  Additional Comments:  ***  Maglione,Angell Honse E 08/27/2023, 9:53 PM

## 2023-08-27 NOTE — Progress Notes (Signed)
 [facility] ED 14 Hospitalized Hospice Patient Note  [name] is a current hospice patient followed at [facility] for terminal diagnosis of COPD.  Patient experienced SOB earlier this morning and patient/son wanted her to be treated and had facility call 911.  Hospice was notified by [facility] after the fact.   Per Dr. Kern Reap, hospice MD, this is a related hospitalization.  Patient is appropriate for GIP LOC requiring skilled care to monitor effectiveness of interventions for SOB.  IV Meds: Solumedrol 125 IV 0831 Lasix 40mg  IV  Vital Signs: T 98 oral, BP 135/67, P 95, R 23 SpO2 95% on 6L O2/[place]  Abnormal Labs: Cl 92, CO2 39, Glucose 117, Albumin 3.3, AST 14, Total Protein 6.3, GFR 59.65, B Natriuretic Peptide 160.0, RBC 3.35, HGB 10.3, HCT 33.7, MCV 100.6  Diagnostics:  Narrative & Impression CLINICAL DATA:  83 year old female with shortness of breath, COPD. History of lung cancer and partial right pneumonectomy.   EXAM: PORTABLE CHEST 1 VIEW   COMPARISON:  Chest CT 03/01/2022 and earlier.   FINDINGS: AP view 0820 hours. Large lung volumes and emphysema demonstrated on prior CT. Stable mediastinal contour since 2023. Stable chronic right hilar surgical clips. Calcified aortic atherosclerosis. Numerous chronic rib fractures. Stable trachea. No pneumothorax, pulmonary edema, pleural effusion or acute lung opacity. No acute osseous abnormality identified. Negative visible bowel gas.   IMPRESSION: Emphysema (ICD10-J43.9). No acute cardiopulmonary abnormality.   Electronically Signed   By: Odessa Fleming M.D.   On: 08/27/2023 08:51   Hospital Plan:  per Dr Mikey College H&P on 4/5 Principal Problem:   COPD (chronic obstructive pulmonary disease) (HCC) Active Problems:   Chronic respiratory failure with hypoxia (HCC)   COPD exacerbation (HCC)   Acute on chronic hypoxic respiratory failure - Check VBG to rule out CO2 retention - Continue IV Solu-Medrol breathing  for p.o. steroid - ICS and LABA - DuoNebs and as neededAlbuterol -Incentive spirometry -PT evaluation -Other DDx, patient appears to be euvolemic, hold off further diuresis.  Findings reviewed, patient has no history of CHF or CAD.   HTN -Blood pressure stable, continue losartan   Mixed hyperlipidemia -Continue Zetia and pravastatin   Anxiety/depression -Mentation at baseline, continue Wellbutrin and Remeron     DVT prophylaxis: Lovenox Code Status: Full code Family Communication: Daughter at bedside Disposition Plan: Expect less than 2 midnight hospital stay Consults called: none  Admission status: Tele obs  Hospice medication list and transfer summary given to ED secretary to place on shadow chart.  Please do not hesitate to call with any hospice related questions or concerns.  Norris Cross, RN Nurse Liaison [phone]

## 2023-08-28 DIAGNOSIS — J441 Chronic obstructive pulmonary disease with (acute) exacerbation: Secondary | ICD-10-CM | POA: Diagnosis not present

## 2023-08-28 LAB — BLOOD GAS, VENOUS
Acid-Base Excess: 12.1 mmol/L — ABNORMAL HIGH (ref 0.0–2.0)
Bicarbonate: 41.7 mmol/L — ABNORMAL HIGH (ref 20.0–28.0)
O2 Saturation: 49.2 %
Patient temperature: 37
pCO2, Ven: 79 mmHg (ref 44–60)
pH, Ven: 7.33 (ref 7.25–7.43)

## 2023-08-28 LAB — BASIC METABOLIC PANEL WITH GFR
Anion gap: 9 (ref 5–15)
BUN: 22 mg/dL (ref 8–23)
CO2: 35 mmol/L — ABNORMAL HIGH (ref 22–32)
Calcium: 8.8 mg/dL — ABNORMAL LOW (ref 8.9–10.3)
Chloride: 94 mmol/L — ABNORMAL LOW (ref 98–111)
Creatinine, Ser: 0.98 mg/dL (ref 0.44–1.00)
GFR, Estimated: 58 mL/min — ABNORMAL LOW (ref >60)
Glucose, Bld: 128 mg/dL — ABNORMAL HIGH (ref 70–99)
Potassium: 4.1 mmol/L (ref 3.5–5.1)
Sodium: 138 mmol/L (ref 135–145)

## 2023-08-28 MED ORDER — PREDNISONE 20 MG PO TABS: 40.0000 mg | ORAL_TABLET | Freq: Every day | ORAL | Status: AC

## 2023-08-28 MED ORDER — IPRATROPIUM-ALBUTEROL 0.5-2.5 (3) MG/3ML IN SOLN: 3.0000 mL | Freq: Four times a day (QID) | RESPIRATORY_TRACT | Status: DC

## 2023-08-28 NOTE — Progress Notes (Signed)
 Patient discharged to ALF via private vehicle with son Arlys John in stable condition. Pt using home O2.

## 2023-08-28 NOTE — Evaluation (Signed)
 Physical Therapy Evaluation Patient Details Name: [name] [name] MRN: [id number] DOB: 06-03-1940 Today's Date: 08/28/2023  History of Present Illness  Pt is an 83 y.o. female with medical history significant of COPD, chronic hypoxic respiratory failure on 3 L continuously, HTN, HLD, anxiety/depression, presented with worsening of dry cough and shortness of breath.  MD assessment includes: acute on chronic hypoxic respiratory failure and HTN.   Clinical Impression  Pt was pleasant and motivated to participate during the session and put forth good effort throughout. Pt found on 5LO2/min with SpO2 99-100% at rest.  Nursing contacted and approved trial on pt's baseline of 3LO2/min.  Pt required no physical assistance during the session and presented with no overt LOB with standing activities.  Pt was generally steady with all standing activities with good eccentric and concentric control during transfers from various height surfaces.  Pt ambulated a self-selected max of around 30 feet after which she was moderately SOB but with SpO2 >/= 90% and HR WNL throughout.  After 30 sec of PLB pt's RR returned to normal with SpO2 in the mid 90s, ok to leave on 3L per nursing.  Pt will benefit from continued PT services upon discharge to safely address deficits listed in patient problem list for decreased caregiver assistance and eventual return to PLOF.          If plan is discharge home, recommend the following: A little help with walking and/or transfers;A little help with bathing/dressing/bathroom;Assist for transportation;Assistance with cooking/housework;Direct supervision/assist for financial management   Can travel by private vehicle        Equipment Recommendations None recommended by PT  Recommendations for Other Services       Functional Status Assessment Patient has had a recent decline in their functional status and demonstrates the ability to make significant improvements in function in a  reasonable and predictable amount of time.     Precautions / Restrictions Precautions Precautions: Fall Restrictions Weight Bearing Restrictions Per Provider Order: No Other Position/Activity Restrictions: DOE, watch SpO2      Mobility  Bed Mobility Overal bed mobility: Independent             General bed mobility comments: Good speed and effort with bed mobility tasks    Transfers Overall transfer level: Needs assistance Equipment used: Rolling walker (2 wheels) Transfers: Sit to/from Stand Sit to Stand: Supervision           General transfer comment: Good eccentric and concentric control and stability    Ambulation/Gait Ambulation/Gait assistance: Supervision Gait Distance (Feet): 30 Feet Assistive device: Rolling walker (2 wheels) Gait Pattern/deviations: Step-through pattern, Decreased step length - right, Decreased step length - left Gait velocity: decreased     General Gait Details: Slow cadence but steady with no overt LOB, mod SOB after amb with SpO2 >/= 90% on 3L, HR WNL  Stairs            Wheelchair Mobility     Tilt Bed    Modified Rankin (Stroke Patients Only)       Balance Overall balance assessment: History of Falls, Needs assistance   Sitting balance-Leahy Scale: Normal     Standing balance support: Bilateral upper extremity supported, During functional activity, Reliant on assistive device for balance Standing balance-Leahy Scale: Good                               Pertinent Vitals/Pain Pain Assessment Pain Assessment: No/denies  pain    Home Living Family/patient expects to be discharged to:: Assisted living                 Home Equipment: Rollator (4 wheels);BSC/3in1;Wheelchair - manual;Shower seat      Prior Function Prior Level of Function : Needs assist;History of Falls (last six months)             Mobility Comments: Mod Ind amb in her room with a rollator at [facility], 2 falls in the  last 6 months secondary to LOB ADLs Comments: Assist from staff with ADLs; pt stated that she has a sitter but could not provide extent of coverage      Extremity/Trunk Assessment   Upper Extremity Assessment Upper Extremity Assessment: Overall WFL for tasks assessed    Lower Extremity Assessment Lower Extremity Assessment: Generalized weakness       Communication        Cognition Arousal: Alert Behavior During Therapy: WFL for tasks assessed/performed   PT - Cognitive impairments: No family/caregiver present to determine baseline                       PT - Cognition Comments: Some difficulty providing details during history Following commands: Intact       Cueing Cueing Techniques: Verbal cues, Tactile cues, Visual cues     General Comments      Exercises     Assessment/Plan    PT Assessment Patient needs continued PT services  PT Problem List Decreased strength;Decreased activity tolerance;Decreased balance;Decreased mobility;Decreased knowledge of use of DME       PT Treatment Interventions DME instruction;Gait training;Functional mobility training;Therapeutic activities;Therapeutic exercise;Balance training;Patient/family education    PT Goals (Current goals can be found in the Care Plan section)  Acute Rehab PT Goals Patient Stated Goal: To get stronger PT Goal Formulation: With patient Time For Goal Achievement: 09/10/23 Potential to Achieve Goals: Good    Frequency Min 2X/week     Co-evaluation               AM-PAC PT "6 Clicks" Mobility  Outcome Measure Help needed turning from your back to your side while in a flat bed without using bedrails?: None Help needed moving from lying on your back to sitting on the side of a flat bed without using bedrails?: None Help needed moving to and from a bed to a chair (including a wheelchair)?: A Little Help needed standing up from a chair using your arms (e.g., wheelchair or bedside chair)?:  A Little Help needed to walk in hospital room?: A Little Help needed climbing 3-5 steps with a railing? : A Little 6 Click Score: 20    End of Session Equipment Utilized During Treatment: Gait belt;Oxygen Activity Tolerance: Patient tolerated treatment well Patient left: in chair;with call bell/phone within reach;with chair alarm set;with nursing/sitter in room Nurse Communication: Mobility status;Other (comment) (Per nursing ok for trial on 3L from 5L and ok to leave on 3L at end of session) PT Visit Diagnosis: History of falling (Z91.81);Muscle weakness (generalized) (M62.81);Difficulty in walking, not elsewhere classified (R26.2)    Time: 6578-4696 PT Time Calculation (min) (ACUTE ONLY): 28 min   Charges:   PT Evaluation $PT Eval Moderate Complexity: 1 Mod   PT General Charges $$ ACUTE PT VISIT: 1 Visit       D. Scott Jance Siek PT, DPT 08/28/23, 11:25 AM

## 2023-08-28 NOTE — Progress Notes (Signed)
 Patient arrived to unit via bed in stable condition with 6L HFNC in place. Patient wears 3L O2 at baseline. Patient arrived with [employer] [facility] DNR form in hand. Patient confirms DNR status and agreeable to purple bracelet.

## 2023-08-28 NOTE — TOC Transition Note (Signed)
 Transition of Care [facility]) - Discharge Note   Patient Details  Name: [name] [name] MRN: [id number] Date of Birth: 1940-08-05  [facility]) CM/SW Contact:  Bing Quarry, RN Phone Number: 08/28/2023, 1:01 PM   Clinical Narrative: 4/6: Patient is pending discharge orders back to [facility]. Son to transport and will bring Wheelchair and transport oxygen from facility. Spoke to facility and will accept back today, requested only DC Summary to be faxed to [phone] when ready. Only concern of facility was that patient was a baseline and had no new needs from prior to admission.    Ander Slade (Son) [phone] [facility])   DC Summary faxed just now.    Gabriel Cirri MSN RN CM  RN Case Manager [place]  Transitions of Care Direct Dial: [phone] (Weekends Only) [facility] Main Office Phone: [phone] [facility] Fax: [phone] [email or web address]   Final next level of care:  (Returning to [facility]) Barriers to Discharge: Barriers Resolved   Patient Goals and CMS Choice            Discharge Placement                       Discharge Plan and Services Additional resources added to the After Visit Summary for                  DME Arranged: N/A DME Agency: NA       HH Arranged: NA          Social Drivers of Health (SDOH) Interventions SDOH Screenings   Food Insecurity: No Food Insecurity (08/27/2023)  Housing: Low Risk  (08/27/2023)  Transportation Needs: No Transportation Needs (08/27/2023)  Utilities: Not At Risk (08/27/2023)  Alcohol Screen: Low Risk  (06/11/2021)  Depression (PHQ2-9): Low Risk  (06/14/2022)  Financial Resource Strain: Low Risk  (06/14/2022)  Physical Activity: Inactive (06/11/2021)  Social Connections: Moderately Isolated (08/27/2023)  Stress: No Stress Concern Present (06/14/2022)  Tobacco Use: Medium Risk (08/27/2023)     Readmission Risk Interventions     No data to display

## 2023-08-28 NOTE — Discharge Summary (Signed)
 Physician Discharge Summary   Patient: [name] [name] MRN: [id number]  DOB: 12-19-1940   Admit:     Date of Admission: 08/27/2023 Admitted from: assisted living    Discharge: Date of discharge: 08/28/23 Disposition: Assisted living Condition at discharge: good  CODE STATUS: DNR     Discharge Physician: Sunnie Nielsen, DO [facility]     PCP: [facility]  Recommendations for Outpatient Follow-up:  Follow up with PCP [facility] in 1-2 weeks    Discharge Instructions     Diet general   Complete by: As directed    Increase activity slowly   Complete by: As directed          Discharge Diagnoses: Principal Problem:   COPD (chronic obstructive pulmonary disease) (HCC) Active Problems:   Chronic respiratory failure with hypoxia (HCC)   COPD exacerbation [facility])       Hospital Course: Hospital course / significant events:   HPI: [name] [name] is a 83 y.o. female with medical history significant of COPD, chronic hypoxic respiratory failure on 3 L continuously, HTN, HLD, anxiety/depression, presented with worsening of dry cough and shortness of breath.   Patient started to have seasonal allergy-like symptoms dry cough runny nose 2 days ago and followed by increasing dry cough and wheezing and shortness of breath.  Denies any chest pain no fever or chills.  This morning, the facility staff found the patient was seen hypoxia O2 saturation in the 80s on 3 L and called EMS.Of note, she is under hospice care for terminal diagnosis of COPD, patient/son wanted her to be treated and had facility call 911. Hospice was notified by [facility] after 911/ED transfer  04/05: admitted to hospitalist service for COPD  04/06: pt reports back to baseline, would like to go back home today, no concerns, SOB is at her usual, O2 requirement baseline      Consultants:  none  Procedures/Surgeries: none      ASSESSMENT  & PLAN:   Acute on chronic hypoxic respiratory failure COPD exacerbation End stage COPD on hospice care  Other DDx, patient appears to be euvolemic, hold off further diuresis.  Findings reviewed, patient has no history of CHF or CAD. Some CO2 retention on labs but likely chronic, pt is alert/oriented  Resume home medications Resume home O2 In future, reach out to hospice for symptoms control if inadequate or seek emergency medical attention as needed   HTN Blood pressure stable, continue losartan   Mixed hyperlipidemia Continue Zetia and pravastatin   Anxiety/depression Mentation at baseline, continue Wellbutrin and Remeron             Discharge Instructions  Allergies as of 08/28/2023       Reactions   Keflex [cephalexin] Shortness Of Breath   Sulbactam Itching   Penicillins    REACTION: hives        Medication List     STOP taking these medications    Baclofen 5 MG Tabs       TAKE these medications    albuterol 108 (90 Base) MCG/ACT inhaler Commonly known as: VENTOLIN HFA Inhale 2 puffs into the lungs every 6 (six) hours as needed for wheezing or shortness of breath.   amLODipine 5 MG tablet Commonly known as: NORVASC Take 5 mg by mouth daily. What changed: Another medication with the same name was removed. Continue taking this medication, and follow the directions you see here.   benzonatate 100  MG capsule Commonly known as: TESSALON Take 100 mg by mouth daily.   buPROPion ER 100 MG 12 hr tablet Commonly known as: WELLBUTRIN SR Take 100 mg by mouth 2 (two) times daily. What changed: Another medication with the same name was removed. Continue taking this medication, and follow the directions you see here.   DOK 100 MG capsule Generic drug: Docusate Sodium Take 100 mg by mouth 3 (three) times daily as needed.   DULoxetine 30 MG capsule Commonly known as: CYMBALTA Take 30 mg by mouth 2 (two) times daily.   ezetimibe 10 MG tablet Commonly  known as: ZETIA TAKE 1 TABLET BY MOUTH DAILY   fluticasone 50 MCG/ACT nasal spray Commonly known as: FLONASE Place 2 sprays into both nostrils daily.   hydrochlorothiazide 12.5 MG tablet Commonly known as: HYDRODIURIL Take 12.5 mg by mouth daily.   hydrOXYzine 10 MG tablet Commonly known as: ATARAX Take 1 tablet (10 mg total) by mouth daily as needed for anxiety.   ipratropium-albuterol 0.5-2.5 (3) MG/3ML Soln Commonly known as: DUONEB Take 3 mLs by nebulization every 4 (four) hours as needed.   loratadine 10 MG tablet Commonly known as: CLARITIN Take 1 tablet (10 mg total) by mouth daily as needed for allergies.   losartan 100 MG tablet Commonly known as: COZAAR TAKE 1 TABLET BY MOUTH DAILY   metoprolol tartrate 25 MG tablet Commonly known as: LOPRESSOR TAKE ONE TABLET BY MOUTH TWICE DAILY   mirtazapine 15 MG tablet Commonly known as: REMERON Take 1 tablet (15 mg total) by mouth at bedtime.   mirtazapine 30 MG tablet Commonly known as: REMERON Take 30 mg by mouth at bedtime.   ondansetron 4 MG tablet Commonly known as: Zofran Take 1 tablet (4 mg total) by mouth every 8 (eight) hours as needed for nausea or vomiting.   pravastatin 80 MG tablet Commonly known as: PRAVACHOL TAKE ONE TABLET BY MOUTH AT BEDTIME   predniSONE 20 MG tablet Commonly known as: DELTASONE Take 2 tablets (40 mg total) by mouth daily with breakfast for 4 days. Start taking on: August 29, 2023   Premarin vaginal cream Generic drug: conjugated estrogens Place vaginally daily. Apply 0.5mg  (pea-sized amount)  just inside the vaginal introitus with a finger-tip on  Monday, Wednesday and Friday nights.   PreserVision AREDS 2 Caps Take 2 capsules by mouth daily.   senna 8.6 MG Tabs tablet Commonly known as: SENOKOT Take 2 tablets by mouth 2 (two) times daily.   traZODone 50 MG tablet Commonly known as: DESYREL Take 50 mg by mouth at bedtime as needed for sleep.   Trelegy Ellipta  200-62.5-25 MCG/ACT Aepb Generic drug: Fluticasone-Umeclidin-Vilant USE ONE PUFF BY MOUTH ONCE A DAY. RINSE MOUTH AFTER EACH USE          Allergies  Allergen Reactions   Keflex [Cephalexin] Shortness Of Breath   Sulbactam Itching   Penicillins     REACTION: hives     Subjective: pt reports SOB is about baseline, much better from yesterday, no CP, no palpitations, no SOB at rest, toelrating diet, ambulating w/ O2   Discharge Exam: BP (!) 155/72   Pulse 82   Temp 98.1 F (36.7 C) (Oral)   Resp 18   Ht 5\' 4"  (1.626 m)   Wt 77.2 kg   SpO2 96%   BMI 29.23 kg/m  General: Pt is alert, awake, not in acute distress Cardiovascular: RRR, S1/S2 +, no rubs, no gallops Respiratory: diminished breath sounds no wheeze  Abdominal: Soft, NT, ND, bowel sounds + Extremities: no edema, no cyanosis     The results of significant diagnostics from this hospitalization (including imaging, microbiology, ancillary and laboratory) are listed below for reference.     Microbiology: Recent Results (from the past 240 hours)  Resp panel by RT-PCR (RSV, Flu A&B, Covid) Anterior Nasal Swab     Status: None   Collection Time: 08/27/23  8:35 AM   Specimen: Anterior Nasal Swab  Result Value Ref Range Status   SARS Coronavirus 2 by RT PCR NEGATIVE NEGATIVE Final    Comment: (NOTE) SARS-CoV-2 target nucleic acids are NOT DETECTED.  The SARS-CoV-2 RNA is generally detectable in upper respiratory specimens during the acute phase of infection. The lowest concentration of SARS-CoV-2 viral copies this assay can detect is 138 copies/mL. A negative result does not preclude SARS-Cov-2 infection and should not be used as the sole basis for treatment or other patient management decisions. A negative result may occur with  improper specimen collection/handling, submission of specimen other than nasopharyngeal swab, presence of viral mutation(s) within the areas targeted by this assay, and inadequate  number of viral copies(<138 copies/mL). A negative result must be combined with clinical observations, patient history, and epidemiological information. The expected result is Negative.  Fact Sheet for Patients:  BloggerCourse.com  Fact Sheet for Healthcare Providers:  SeriousBroker.it  This test is no t yet approved or cleared by the [place] FDA and  has been authorized for detection and/or diagnosis of SARS-CoV-2 by FDA under an Emergency Use Authorization (EUA). This EUA will remain  in effect (meaning this test can be used) for the duration of the COVID-19 declaration under Section 564(b)(1) of the Act, 21 U.S.C.section 360bbb-3(b)(1), unless the authorization is terminated  or revoked sooner.       Influenza A by PCR NEGATIVE NEGATIVE Final   Influenza B by PCR NEGATIVE NEGATIVE Final    Comment: (NOTE) The Xpert Xpress SARS-CoV-2/FLU/RSV plus assay is intended as an aid in the diagnosis of influenza from Nasopharyngeal swab specimens and should not be used as a sole basis for treatment. Nasal washings and aspirates are unacceptable for Xpert Xpress SARS-CoV-2/FLU/RSV testing.  Fact Sheet for Patients: BloggerCourse.com  Fact Sheet for Healthcare Providers: SeriousBroker.it  This test is not yet approved or cleared by the [place] FDA and has been authorized for detection and/or diagnosis of SARS-CoV-2 by FDA under an Emergency Use Authorization (EUA). This EUA will remain in effect (meaning this test can be used) for the duration of the COVID-19 declaration under Section 564(b)(1) of the Act, 21 U.S.C. section 360bbb-3(b)(1), unless the authorization is terminated or revoked.     Resp Syncytial Virus by PCR NEGATIVE NEGATIVE Final    Comment: (NOTE) Fact Sheet for Patients: BloggerCourse.com  Fact Sheet for Healthcare  Providers: SeriousBroker.it  This test is not yet approved or cleared by the [place] FDA and has been authorized for detection and/or diagnosis of SARS-CoV-2 by FDA under an Emergency Use Authorization (EUA). This EUA will remain in effect (meaning this test can be used) for the duration of the COVID-19 declaration under Section 564(b)(1) of the Act, 21 U.S.C. section 360bbb-3(b)(1), unless the authorization is terminated or revoked.  Performed at [facility], [address] [place], [place] [address]      Labs: BNP (last 3 results) Recent Labs    08/27/23 0811  BNP 160.0*   Basic Metabolic Panel: Recent Labs  Lab 08/27/23 0811 08/28/23 [phone]  NA 138 138  K 4.6 4.1  CL 92* 94*  CO2 39* 35*  GLUCOSE 117* 128*  BUN 15 22  CREATININE 0.88 0.98  CALCIUM 8.9 8.8*   Liver Function Tests: Recent Labs  Lab 08/27/23 0811  AST 14*  ALT 12  ALKPHOS 38  BILITOT 0.4  PROT 6.3*  ALBUMIN 3.3*   No results for input(s): "LIPASE", "AMYLASE" in the last 168 hours. No results for input(s): "AMMONIA" in the last 168 hours. CBC: Recent Labs  Lab 08/27/23 0811  WBC 8.1  HGB 10.3*  HCT 33.7*  MCV 100.6*  PLT 167   Cardiac Enzymes: No results for input(s): "CKTOTAL", "CKMB", "CKMBINDEX", "TROPONINI" in the last 168 hours. BNP: Invalid input(s): "POCBNP" CBG: No results for input(s): "GLUCAP" in the last 168 hours. D-Dimer No results for input(s): "DDIMER" in the last 72 hours. Hgb A1c No results for input(s): "HGBA1C" in the last 72 hours. Lipid Profile No results for input(s): "CHOL", "HDL", "LDLCALC", "TRIG", "CHOLHDL", "LDLDIRECT" in the last 72 hours. Thyroid function studies No results for input(s): "TSH", "T4TOTAL", "T3FREE", "THYROIDAB" in the last 72 hours.  Invalid input(s): "FREET3" Anemia work up No results for input(s): "VITAMINB12", "FOLATE", "FERRITIN", "TIBC", "IRON", "RETICCTPCT" in the last 72  hours. Urinalysis    Component Value Date/Time   COLORURINE Amber 07/24/2012 1505   COLORURINE lt. yellow 04/21/2007 1552   APPEARANCEUR Clear 02/12/2021 1317   LABSPEC 1.029 07/24/2012 1505   PHURINE 5.0 07/24/2012 1505   PHURINE 6.0 04/21/2007 1552   GLUCOSEU Negative 02/12/2021 1317   GLUCOSEU Negative 07/24/2012 1505   HGBUR 1+ 07/24/2012 1505   HGBUR large 04/21/2007 1552   BILIRUBINUR negative 02/09/2022 1518   BILIRUBINUR Negative 02/12/2021 1317   BILIRUBINUR Negative 07/24/2012 1505   KETONESUR Negative 07/24/2012 1505   PROTEINUR Negative 02/09/2022 1518   PROTEINUR Negative 02/12/2021 1317   PROTEINUR 30 mg/dL 04/54/0981 1914   UROBILINOGEN 0.2 02/09/2022 1518   UROBILINOGEN 0.2 04/21/2007 1552   NITRITE positive 02/09/2022 1518   NITRITE Negative 02/12/2021 1317   NITRITE Positive 07/24/2012 1505   NITRITE negative 04/21/2007 1552   LEUKOCYTESUR Moderate (2+) (A) 02/09/2022 1518   LEUKOCYTESUR Trace (A) 02/12/2021 1317   LEUKOCYTESUR 1+ 07/24/2012 1505   Sepsis Labs Recent Labs  Lab 08/27/23 0811  WBC 8.1   Microbiology Recent Results (from the past 240 hours)  Resp panel by RT-PCR (RSV, Flu A&B, Covid) Anterior Nasal Swab     Status: None   Collection Time: 08/27/23  8:35 AM   Specimen: Anterior Nasal Swab  Result Value Ref Range Status   SARS Coronavirus 2 by RT PCR NEGATIVE NEGATIVE Final    Comment: (NOTE) SARS-CoV-2 target nucleic acids are NOT DETECTED.  The SARS-CoV-2 RNA is generally detectable in upper respiratory specimens during the acute phase of infection. The lowest concentration of SARS-CoV-2 viral copies this assay can detect is 138 copies/mL. A negative result does not preclude SARS-Cov-2 infection and should not be used as the sole basis for treatment or other patient management decisions. A negative result may occur with  improper specimen collection/handling, submission of specimen other than nasopharyngeal swab, presence of viral  mutation(s) within the areas targeted by this assay, and inadequate number of viral copies(<138 copies/mL). A negative result must be combined with clinical observations, patient history, and epidemiological information. The expected result is Negative.  Fact Sheet for Patients:  BloggerCourse.com  Fact Sheet for Healthcare Providers:  SeriousBroker.it  This test is no t yet  approved or cleared by the [place] and  has been authorized for detection and/or diagnosis of SARS-CoV-2 by FDA under an Emergency Use Authorization (EUA). This EUA will remain  in effect (meaning this test can be used) for the duration of the COVID-19 declaration under Section 564(b)(1) of the Act, 21 U.S.C.section 360bbb-3(b)(1), unless the authorization is terminated  or revoked sooner.       Influenza A by PCR NEGATIVE NEGATIVE Final   Influenza B by PCR NEGATIVE NEGATIVE Final    Comment: (NOTE) The Xpert Xpress SARS-CoV-2/FLU/RSV plus assay is intended as an aid in the diagnosis of influenza from Nasopharyngeal swab specimens and should not be used as a sole basis for treatment. Nasal washings and aspirates are unacceptable for Xpert Xpress SARS-CoV-2/FLU/RSV testing.  Fact Sheet for Patients: BloggerCourse.com  Fact Sheet for Healthcare Providers: SeriousBroker.it  This test is not yet approved or cleared by the [place] FDA and has been authorized for detection and/or diagnosis of SARS-CoV-2 by FDA under an Emergency Use Authorization (EUA). This EUA will remain in effect (meaning this test can be used) for the duration of the COVID-19 declaration under Section 564(b)(1) of the Act, 21 U.S.C. section 360bbb-3(b)(1), unless the authorization is terminated or revoked.     Resp Syncytial Virus by PCR NEGATIVE NEGATIVE Final    Comment: (NOTE) Fact Sheet for  Patients: BloggerCourse.com  Fact Sheet for Healthcare Providers: SeriousBroker.it  This test is not yet approved or cleared by the [place] FDA and has been authorized for detection and/or diagnosis of SARS-CoV-2 by FDA under an Emergency Use Authorization (EUA). This EUA will remain in effect (meaning this test can be used) for the duration of the COVID-19 declaration under Section 564(b)(1) of the Act, 21 U.S.C. section 360bbb-3(b)(1), unless the authorization is terminated or revoked.  Performed at [facility], [address] [place], [place] [address]    Imaging DG Chest Portable 1 View Result Date: 08/27/2023 CLINICAL DATA:  83 year old female with shortness of breath, COPD. History of lung cancer and partial right pneumonectomy. EXAM: PORTABLE CHEST 1 VIEW COMPARISON:  Chest CT 03/01/2022 and earlier. FINDINGS: AP view 0820 hours. Large lung volumes and emphysema demonstrated on prior CT. Stable mediastinal contour since 2023. Stable chronic right hilar surgical clips. Calcified aortic atherosclerosis. Numerous chronic rib fractures. Stable trachea. No pneumothorax, pulmonary edema, pleural effusion or acute lung opacity. No acute osseous abnormality identified. Negative visible bowel gas. IMPRESSION: Emphysema (ICD10-J43.9). No acute cardiopulmonary abnormality. Electronically Signed   By: Odessa Fleming M.D.   On: 08/27/2023 08:51      Time coordinating discharge: over 30 minutes  SIGNED:  Sunnie Nielsen DO [facility]

## 2023-08-28 NOTE — Hospital Course (Signed)
 Hospital course / significant events:   HPI: [name] [name] is a 83 y.o. female with medical history significant of COPD, chronic hypoxic respiratory failure on 3 L continuously, HTN, HLD, anxiety/depression, presented with worsening of dry cough and shortness of breath.   Patient started to have seasonal allergy-like symptoms dry cough runny nose 2 days ago and followed by increasing dry cough and wheezing and shortness of breath.  Denies any chest pain no fever or chills.  This morning, the facility staff found the patient was seen hypoxia O2 saturation in the 80s on 3 L and called EMS.Of note, she is under hospice care for terminal diagnosis of COPD, patient/son wanted her to be treated and had facility call 911. Hospice was notified by [facility] after 911/ED transfer  04/05: admitted to hospitalist service for COPD  04/06:      Consultants:  ***  Procedures/Surgeries: ***      ASSESSMENT & PLAN:   Acute on chronic hypoxic respiratory failure COPD exacerbation End stage COPD on hospice care  Other DDx, patient appears to be euvolemic, hold off further diuresis.  Findings reviewed, patient has no history of CHF or CAD. - Check VBG to rule out CO2 retention - Continue IV Solu-Medrol breathing for p.o. steroid - ICS and LABA - DuoNebs and as neededAlbuterol -Incentive spirometry -PT evaluation -   HTN -Blood pressure stable, continue losartan   Mixed hyperlipidemia -Continue Zetia and pravastatin   Anxiety/depression -Mentation at baseline, continue Wellbutrin and Remeron     *** based on BMI: Body mass index is 29.23 kg/m.  Underweight - under 18  overweight - 25 to 29 obese - 30 or more Class 1 obesity: BMI of 30.0 to 34 Class 2 obesity: BMI of 35.0 to 39 Class 3 obesity: BMI of 40.0 to 49 Super Morbid Obesity: BMI 50-59 Super-super Morbid Obesity: BMI 60+ Significantly low or high BMI is associated with higher medical risk.  Weight management  advised as adjunct to other disease management and risk reduction treatments    DVT prophylaxis: *** IV fluids: *** continuous IV fluids  Nutrition: *** Central lines / other devices: ***  Code Status: *** ACP documentation reviewed: *** none on file in [facility]  TOC needs: *** Medical barriers to dispo: ***. Expected medical readiness for discharge ***.

## 2023-09-14 ENCOUNTER — Other Ambulatory Visit: Payer: Self-pay | Admitting: Family Medicine

## 2023-09-14 DIAGNOSIS — J432 Centrilobular emphysema: Secondary | ICD-10-CM

## 2023-12-23 DEATH — deceased
